# Patient Record
Sex: Female | Born: 1960 | Race: White | Hispanic: No | Marital: Married | Smoking: Never smoker
Health system: Southern US, Community
[De-identification: ages and names within clinical notes are randomized; demographics above are authoritative.]

## PROBLEM LIST (undated history)

## (undated) DIAGNOSIS — Q2381 Bicuspid aortic valve: Secondary | ICD-10-CM

## (undated) DIAGNOSIS — R011 Cardiac murmur, unspecified: Secondary | ICD-10-CM

## (undated) DIAGNOSIS — I498 Other specified cardiac arrhythmias: Secondary | ICD-10-CM

## (undated) DIAGNOSIS — Q251 Coarctation of aorta: Secondary | ICD-10-CM

## (undated) DIAGNOSIS — I776 Arteritis, unspecified: Secondary | ICD-10-CM

## (undated) DIAGNOSIS — I712 Thoracic aortic aneurysm, without rupture, unspecified: Secondary | ICD-10-CM

## (undated) DIAGNOSIS — I499 Cardiac arrhythmia, unspecified: Secondary | ICD-10-CM

## (undated) DIAGNOSIS — I350 Nonrheumatic aortic (valve) stenosis: Secondary | ICD-10-CM

## (undated) DIAGNOSIS — R0602 Shortness of breath: Secondary | ICD-10-CM

## (undated) DIAGNOSIS — C8590 Non-Hodgkin lymphoma, unspecified, unspecified site: Secondary | ICD-10-CM

## (undated) DIAGNOSIS — I480 Paroxysmal atrial fibrillation: Secondary | ICD-10-CM

## (undated) DIAGNOSIS — I509 Heart failure, unspecified: Secondary | ICD-10-CM

## (undated) DIAGNOSIS — Q231 Congenital insufficiency of aortic valve: Secondary | ICD-10-CM

## (undated) DIAGNOSIS — N809 Endometriosis, unspecified: Secondary | ICD-10-CM

## (undated) HISTORY — DX: Coarctation of aorta: Q25.1

## (undated) HISTORY — DX: Paroxysmal atrial fibrillation: I48.0

## (undated) HISTORY — PX: OTHER SURGICAL HISTORY: SHX169

## (undated) HISTORY — DX: Nonrheumatic aortic (valve) stenosis: I35.0

## (undated) HISTORY — DX: Thoracic aortic aneurysm, without rupture, unspecified: I71.20

## (undated) HISTORY — DX: Endometriosis, unspecified: N80.9

## (undated) HISTORY — DX: Congenital insufficiency of aortic valve: Q23.1

## (undated) HISTORY — DX: Thoracic aortic aneurysm, without rupture: I71.2

## (undated) HISTORY — DX: Bicuspid aortic valve: Q23.81

## (undated) HISTORY — DX: Other specified cardiac arrhythmias: I49.8

## (undated) HISTORY — DX: Shortness of breath: R06.02

## (undated) HISTORY — DX: Arteritis, unspecified: I77.6

## (undated) HISTORY — PX: PORTA CATH INSERTION: CATH118285

## (undated) HISTORY — PX: KNEE SURGERY: SHX244

---

## 1998-04-24 ENCOUNTER — Ambulatory Visit (HOSPITAL_COMMUNITY): Admission: RE | Admit: 1998-04-24 | Discharge: 1998-04-24 | Payer: Self-pay | Admitting: Cardiovascular Disease

## 2001-09-20 ENCOUNTER — Ambulatory Visit (HOSPITAL_COMMUNITY): Admission: RE | Admit: 2001-09-20 | Discharge: 2001-09-20 | Payer: Self-pay | Admitting: Cardiovascular Disease

## 2002-04-14 ENCOUNTER — Ambulatory Visit (HOSPITAL_COMMUNITY): Admission: RE | Admit: 2002-04-14 | Discharge: 2002-04-14 | Payer: Self-pay | Admitting: Cardiovascular Disease

## 2002-04-14 ENCOUNTER — Encounter: Payer: Self-pay | Admitting: Cardiovascular Disease

## 2004-05-13 ENCOUNTER — Ambulatory Visit: Payer: Self-pay | Admitting: Internal Medicine

## 2004-08-18 ENCOUNTER — Ambulatory Visit: Payer: Self-pay | Admitting: Cardiovascular Disease

## 2004-08-29 ENCOUNTER — Ambulatory Visit: Payer: Self-pay

## 2006-11-25 ENCOUNTER — Ambulatory Visit: Payer: Self-pay | Admitting: Cardiovascular Disease

## 2007-04-06 ENCOUNTER — Other Ambulatory Visit: Admission: RE | Admit: 2007-04-06 | Discharge: 2007-04-06 | Payer: Self-pay | Admitting: Obstetrics and Gynecology

## 2008-07-18 DIAGNOSIS — I480 Paroxysmal atrial fibrillation: Secondary | ICD-10-CM

## 2008-07-18 DIAGNOSIS — Q231 Congenital insufficiency of aortic valve: Secondary | ICD-10-CM | POA: Insufficient documentation

## 2008-07-19 ENCOUNTER — Ambulatory Visit: Payer: Self-pay | Admitting: Cardiovascular Disease

## 2008-08-13 ENCOUNTER — Telehealth: Payer: Self-pay | Admitting: Cardiovascular Disease

## 2008-09-21 ENCOUNTER — Telehealth (INDEPENDENT_AMBULATORY_CARE_PROVIDER_SITE_OTHER): Payer: Self-pay | Admitting: *Deleted

## 2009-02-25 ENCOUNTER — Telehealth: Payer: Self-pay | Admitting: Cardiovascular Disease

## 2009-02-27 ENCOUNTER — Telehealth (INDEPENDENT_AMBULATORY_CARE_PROVIDER_SITE_OTHER): Payer: Self-pay | Admitting: *Deleted

## 2009-08-26 ENCOUNTER — Telehealth: Payer: Self-pay | Admitting: Cardiovascular Disease

## 2009-09-12 ENCOUNTER — Ambulatory Visit: Payer: Self-pay | Admitting: Cardiovascular Disease

## 2009-09-12 DIAGNOSIS — Q251 Coarctation of aorta: Secondary | ICD-10-CM | POA: Insufficient documentation

## 2009-09-12 DIAGNOSIS — I359 Nonrheumatic aortic valve disorder, unspecified: Secondary | ICD-10-CM | POA: Insufficient documentation

## 2009-09-12 DIAGNOSIS — N809 Endometriosis, unspecified: Secondary | ICD-10-CM | POA: Insufficient documentation

## 2009-10-02 ENCOUNTER — Ambulatory Visit: Payer: Self-pay | Admitting: Cardiology

## 2009-10-02 ENCOUNTER — Encounter: Payer: Self-pay | Admitting: Cardiovascular Disease

## 2009-10-02 ENCOUNTER — Ambulatory Visit (HOSPITAL_COMMUNITY): Admission: RE | Admit: 2009-10-02 | Discharge: 2009-10-02 | Payer: Self-pay | Admitting: Cardiovascular Disease

## 2009-10-02 ENCOUNTER — Ambulatory Visit: Payer: Self-pay

## 2010-02-25 ENCOUNTER — Telehealth: Payer: Self-pay | Admitting: Cardiovascular Disease

## 2010-02-26 ENCOUNTER — Encounter: Payer: Self-pay | Admitting: Cardiovascular Disease

## 2010-03-12 ENCOUNTER — Ambulatory Visit: Payer: Self-pay | Admitting: Cardiovascular Disease

## 2010-03-12 DIAGNOSIS — I712 Thoracic aortic aneurysm, without rupture: Secondary | ICD-10-CM

## 2010-03-13 LAB — CONVERTED CEMR LAB
BUN: 8 mg/dL (ref 6–23)
Basophils Absolute: 0 10*3/uL (ref 0.0–0.1)
Basophils Relative: 0.5 % (ref 0.0–3.0)
CO2: 27 meq/L (ref 19–32)
Calcium: 9.3 mg/dL (ref 8.4–10.5)
Chloride: 105 meq/L (ref 96–112)
Creatinine, Ser: 0.7 mg/dL (ref 0.4–1.2)
Eosinophils Absolute: 0.2 10*3/uL (ref 0.0–0.7)
Eosinophils Relative: 1.9 % (ref 0.0–5.0)
GFR calc non Af Amer: 96.52 mL/min (ref >60)
Glucose, Bld: 74 mg/dL (ref 70–99)
HCT: 43.2 % (ref 36.0–46.0)
Hemoglobin: 14.8 g/dL (ref 12.0–15.0)
Lymphocytes Relative: 21.7 % (ref 12.0–46.0)
Lymphs Abs: 2.1 10*3/uL (ref 0.7–4.0)
MCHC: 34.2 g/dL (ref 30.0–36.0)
MCV: 93.8 fL (ref 78.0–100.0)
Monocytes Absolute: 0.8 10*3/uL (ref 0.1–1.0)
Monocytes Relative: 8.3 % (ref 3.0–12.0)
Neutro Abs: 6.6 10*3/uL (ref 1.4–7.7)
Neutrophils Relative %: 67.6 % (ref 43.0–77.0)
Platelets: 302 10*3/uL (ref 150.0–400.0)
Potassium: 4.1 meq/L (ref 3.5–5.1)
Pro B Natriuretic peptide (BNP): 85.3 pg/mL (ref 0.0–100.0)
RBC: 4.61 M/uL (ref 3.87–5.11)
RDW: 13.3 % (ref 11.5–14.6)
Sodium: 141 meq/L (ref 135–145)
WBC: 9.8 10*3/uL (ref 4.5–10.5)

## 2010-04-10 ENCOUNTER — Ambulatory Visit: Payer: Self-pay

## 2010-04-10 ENCOUNTER — Encounter: Payer: Self-pay | Admitting: Cardiovascular Disease

## 2010-04-10 ENCOUNTER — Ambulatory Visit (HOSPITAL_COMMUNITY)
Admission: RE | Admit: 2010-04-10 | Discharge: 2010-04-10 | Payer: Self-pay | Source: Home / Self Care | Attending: Cardiovascular Disease | Admitting: Cardiovascular Disease

## 2010-04-23 ENCOUNTER — Ambulatory Visit: Payer: Self-pay | Admitting: Cardiovascular Disease

## 2010-04-23 DIAGNOSIS — R0602 Shortness of breath: Secondary | ICD-10-CM

## 2010-05-06 LAB — CONVERTED CEMR LAB
BUN: 6 mg/dL (ref 6–23)
CO2: 28 meq/L (ref 19–32)
Calcium: 9.2 mg/dL (ref 8.4–10.5)
Chloride: 105 meq/L (ref 96–112)
Creatinine, Ser: 0.8 mg/dL (ref 0.4–1.2)
GFR calc non Af Amer: 85 mL/min (ref >60.00)
Glucose, Bld: 75 mg/dL (ref 70–99)
Potassium: 4.1 meq/L (ref 3.5–5.1)
Pro B Natriuretic peptide (BNP): 61.9 pg/mL (ref 0.0–100.0)
Sodium: 140 meq/L (ref 135–145)

## 2010-06-03 NOTE — Assessment & Plan Note (Signed)
Summary: f25m/dfg   Primary Provider:  Dr.  CC:  SOB.  History of Present Illness: Mallory Sutton  returns today for follow-up.  I initially met her about 11 years ago.  When her son was born, she went into pulmonary edema at [facility] and was found to have recoarctation.  The patient unfortunately does not have insurance.  She tends to see me every couple of years now and likes to avoid imaging.  She has a bicuspid aortic valve with aortic stenosis.  She has had significant aortic root dilatation.  Her coarctation repair was done within the first 2 years of life and I believe it was an end-to-end anastomosis.  Back in 2003, her recoarctation area showed that her luminal diameter was down to as much as 10 mm.  She has had an ascending aorta, this measured as large as 42 mm.  She just had an echo in June  and her peak velocity was 3.35 m/sec, pl gradient 45 mmHg, and mean gradient 25 mm Hg with mild AR.   We scanned her on the Philops 256 during apps training.  Her ascending aortic root is stable at 42mm and residual coarctatoin diameter is 11mm with no anastomotic aneuyrsm.  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    She has had increasing exertional dyspnea which is a bit worrisome.  We discussed issues with exepertise in single surgery consisting of a Bental with bypass graft from root to descending thoracic oarta.  Dr Tomasa Blase at [facility] has a large series and I think this would be the best place for Mallory Sutton to consider surgery when the time comes.  She will likley need a mechanical conduit given her relatively young age.  We will check a BMET/BNP today and recheck an echo in December.  She will not have insurance that covers a pre-existing condition until a year from February.  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 Take 1 Tablet T.i.d. 2)  Toprol Xl 25 Mg Xr24h-Tab (Metoprolol Succinate) .... Take 1 Tablet By Mouth Once A Day 3)  Lisinopril 5 Mg Tabs (Lisinopril) .... Take One Tablet By Mouth Daily 4)  Promethazine Hcl 25 Mg Tabs (Promethazine Hcl) .... As Needed  Allergies (verified): No Known Drug Allergies  Past History:  Past Medical History: Last updated: 07/18/2008  bicuspid aortic valve with aortic stenosis paroxysmal atrial fibrillation   Past Surgical History: Last updated: 07/18/2008 coarctation repair with residual restenosis. knee surgery  Family History: Last updated: 07/18/2008 noncontributory  Social History: Last updated: 07/18/2008 She is very active. 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  Alcohol Use - no  Review of Systems       Denies fever, malais, weight loss, blurry vision, decreased visual acuity, cough, sputum, , hemoptysis, pleuritic pain, palpitaitons, heartburn, abdominal pain, melena, lower extremity edema, claudication, or rash.   Vital Signs:  Patient profile:   50 year old female Height:      64 inches Weight:      131 pounds BMI:     22.57 Pulse  rate:   68 / minute Resp:     14 per minute BP sitting:   118 / 80  (left arm)  Vitals Entered By: Kem Parkinson (March 12, 2010 11:22 AM)  Physical Exam  General:  Affect appropriate Healthy:  appears stated age HEENT: normal Neck supple with no adenopathy JVP normal no bruits no thyromegaly Lungs clear with no wheezing and good diaphragmatic motion Heart:  S1/S2 AS/AR   murmur no rub, gallop or click PMI normal S/P left thoracotomy scar for coartation repair.  Residual coarctation murmur in back Abdomen: benighn, BS positve, no  tenderness, no AAA no bruit.  No HSM or HJR Distal pulses intact with no bruits No edema Neuro non-focal Skin warm and dry    Impression & Recommendations:  Problem # 1:  COARCTATION OF AORTA (ICD-747.10) Peak velocity on echo in June 36 mmHg.  Contributing to aneurysmal dilatation of ascending aorta and dyspnea.   F/U echo in December.    Problem # 2:  BICUSPID AORTIC VALVE (ICD-746.4) Not surgical by numbers but in combination with coarctation will likely be symptomatic at more moderate disease Will need consideration of surgery at specialty center like [facility] when time comes due to complexity of disease Her updated medication list for this problem includes:    Rythmol 150 Mg Tabs (Propafenone hcl) .Marland Kitchen... Take 1 tablet t.i.d.    Toprol Xl 25 Mg Xr24h-tab (Metoprolol succinate) .Marland Kitchen... Take 1 tablet by mouth once a day    Lisinopril 5 Mg Tabs (Lisinopril) .Marland Kitchen... 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  Stable at 42mm.  F/U imaging in 6 months  Other Orders: TLB-BMP (Basic Metabolic Panel-BMET) (80048-METABOL) TLB-CBC Platelet - w/Differential (85025-CBCD) TLB-BNP (B-Natriuretic Peptide) (83880-BNPR) Echocardiogram (Echo)  Patient Instructions: 1)  Your physician recommends that you schedule a follow-up appointment in: December 2)  Your physician has requested that you have an echocardiogram.  Echocardiography is a painless test that uses sound waves to create images of your heart. It provides your doctor with information about the size and shape of your heart and how well your heart's chambers and valves are working.  This procedure takes approximately one hour. There are no restrictions for this procedure. Schedule in December same day as appointment

## 2010-06-03 NOTE — Progress Notes (Signed)
Summary: Question about taking a med  Phone Note Call from Patient Call back at Home Phone [phone]   Caller: Patient Summary of Call: Pt calling to see if she can take Valium befoe CT Initial call taken by: Judie Grieve,  February 25, 2010 11:32 AM  Follow-up for Phone Call        left message for pt, okay to take valium but will need a driver Deliah Goody, RN  February 25, 2010 4:29 PM

## 2010-06-03 NOTE — Progress Notes (Signed)
Summary: refill**Pt it out**  Phone Note Refill Request   Refills Requested: Medication #1:  RYTHMOL 150 MG TABS take 1 tablet t.i.d.   Supply Requested: 3 months [facility]    Method Requested: Fax to Local Pharmacy Initial call taken by: Migdalia Dk,  August 26, 2009 10:30 AM Caller: Patient Reason for Call: Talk to Nurse    Prescriptions: RYTHMOL 150 MG TABS (PROPAFENONE HCL) take 1 tablet t.i.d.  #270 x 3   Entered by:   Kem Parkinson   Authorized by:   Colon Branch, MD, [facility]   Signed by:   Kem Parkinson on 08/26/2009   Method used:   Electronically to        [facility] [address] [phone]* (retail)       [address] [place], [place]  [address]       Ph: [phone]       Fax: [phone]   RxID:   [id number]

## 2010-06-03 NOTE — Assessment & Plan Note (Signed)
Summary: f1y/jss   Primary Provider:  Dr.   History of Present Illness: [name] returns today for followup.  I initially met her about 11 years ago.  When her son was born, she went into pulmonary edema at [facility] and was found to have recoarctation.  The patient unfortunately does not have insurance.  She tends to see me every couple of years now and likes to avoid imaging.  She has a bicuspid aortic valve with aortic stenosis.  She has had significant aortic root dilatation.  Her coarctation repair was done within the first 2 years of life and I believe it was an end-to-end anastomosis.  Back in 2003, her recoarctation area showed that her luminal diameter was down to as much as 10 mm.  She has had an ascending aorta, this measured as large as 42 mm.  Her echoes have showed peak aortic velocities of 3 meters per second with a peak gradient of 34 and a mean gradient of 21 with a bicuspid aortic valve.  She has had some dyspnea which is stable but likely due to her AS.  There has been no overt CHF.  She has had chronic severe endometriosis.  She needs a hysterectomy.  We had a long discussion about this. I would rather have her do it before any AVR/Root repair as this would be more complicated with anticoagulaiton and risk of bacteremia.  I think she could get through a hysterectomy with moderate risk of CHF.  We will try to image her thoracic aota with the new Philops 256 scanner in July to size her root and estimate the severity of her coarctation.  She has no dye allergy.  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 Take 1 Tablet T.i.d. 2)  Toprol Xl 25 Mg Xr24h-Tab (Metoprolol Succinate) .... Take 1 Tablet By Mouth Once A  Day 3)  Lisinopril 5 Mg Tabs (Lisinopril) .... Take One Tablet By Mouth Daily 4)  Promethazine Hcl 25 Mg Tabs (Promethazine Hcl) .... As Needed  Allergies (verified): No Known Drug Allergies  Past History:  Past Medical History: Last updated: 07/18/2008  bicuspid aortic valve with aortic stenosis paroxysmal atrial fibrillation   Past Surgical History: Last updated: 07/18/2008 coarctation repair with residual restenosis. knee surgery  Family History: Last updated: 07/18/2008 noncontributory  Social History: Last updated: 07/18/2008 She is very active. 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  Alcohol Use - no  Review of Systems       Denies fever, malais, weight loss, blurry vision, decreased visual acuity, cough, sputum,  hemoptysis, pleuritic pain, palpitaitons, heartburn, abdominal pain, melena, lower extremity edema, claudication, or rash.   Vital Signs:  Patient profile:   50 year old female Height:      64 inches Weight:      131 pounds BMI:     22.57 Pulse rate:   65 / minute BP sitting:   135 / 77  (left arm)  Vitals Entered By: Kem Parkinson (Sep 12, 2009 10:12 AM)  Physical Exam  General:  Affect appropriate Healthy:  appears stated age HEENT: normal Neck supple with no adenopathy JVP normal no bruits no thyromegaly Lungs clear with no wheezing and  good diaphragmatic motion Heart:  S1/S2 AS  murmurno ,rub, gallop or click PMI normal Coarctation murmur in mid back Abdomen: benighn, BS positve, no tenderness, no AAA no bruit.  No HSM or HJR Distal pulses intact with no bruits No edema Neuro non-focal Skin warm and dry    Impression & Recommendations:  Problem # 1:  COARCTATION OF AORTA (ICD-747.10) CTA in July during apps if possible given lack of insurance Orders: Echocardiogram (Echo)  Problem # 2:  AORTIC STENOSIS (ICD-424.1) Moderate by exam.  F/U doppler.  Will use tradtional criteria for AVR/Root replacement.  Coarctation  issue is separate Her updated medication list for this problem includes:    Toprol Xl 25 Mg Xr24h-tab (Metoprolol succinate) .Marland Kitchen... Take 1 tablet by mouth once a day    Lisinopril 5 Mg Tabs (Lisinopril) .Marland Kitchen... Take one tablet by mouth daily  Orders: Echocardiogram (Echo)  Problem # 3:  ENDOMETRIOSIS (ICD-617.9) Severe and debilitating since 2003.  I think she should have a hysterectomy.  Her cardiac risk will only increase over time and I would rather her have surgery when clincially stable, and before any AVR/Root repair. She will see Dr Senaida Ores next month and we can coordinate care if she agrees that surgery is indicated.  Moderate risk of CHF.  Other Orders: EKG w/ Interpretation (93000)  Patient Instructions: 1)  Your physician recommends that you schedule a follow-up appointment in:6 months with Dr. [place] Emms 2)  Your physician recommends that you continue on your current medications as directed. Please refer to the Current Medication list given to you today. 3)  Your physician has requested that you have an echocardiogram.  Echocardiography is a painless test that uses sound waves to create images of your heart. It provides your doctor with information about the size and shape of your heart and how well your heart's chambers and valves are working.  This procedure takes approximately one hour. There are no restrictions for this procedure. Prescriptions: PROMETHAZINE HCL 25 MG TABS (PROMETHAZINE HCL) as needed  #30 x 6   Entered by:   Lisabeth Devoid RN   Authorized by:   Colon Branch, MD, [facility]   Signed by:   Lisabeth Devoid RN on 09/12/2009   Method used:   Electronically to        [facility] [phone]* (retail)       [address] [place], [place]  [address]       Ph: [phone]       Fax: [phone]   RxID:   [id number]    EKG Report  Procedure date:  09/12/2009  Findings:      SR 63 Possible IMI Poor R wave progression Abnormal ECG

## 2010-06-05 NOTE — Assessment & Plan Note (Signed)
Summary: f/u echo done today at 10:30/sl   Primary Provider:  Dr.  CC:  follow up echo...and sob.  History of Present Illness: Creta returns today for F/U of dyspnea, Ascending aortic aneurysm in setting of bicuspid aortic valve with moderate stenosis.  She is S/O coarctation repair as child with recurrent stenosis.  Reviewed CT done 10/11 Ascending AO: 4.3 cm Minimal Coarctation diameter: 11mm Cal Score 0 No CAD Persistant Left SVC  Reviewed echo 12/11 and EF normal, septal thickness 13mm, coarctation gradient around and moderate AS with mean gradient 57mmHg/peak of  Contacted Dr Tomasa Blase at [facility]. He specializes in [place] repair with ascending to descending thoracic bypass to treat all of her issues with single operation.  Have initiated paperwork for charity care since Aviendha has no insurance.  If [facility] can do surgery with special financial assistance have advised her to proceed.  Otherwise individual componants of her disease do not warrant operation right away.   She does have dyspnea.  Interetingly it is worse in recumbancy and not exertion.  No clinical CHF.  No palpitaiotns, SSCP syncope or edema.    Husband has been training as long distance truck driver.  Has not been home since September and this has been hard on her and her youngest son who is only 2.  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 Take 1 Tablet T.i.d. 2)  Toprol Xl 25 Mg Xr24h-Tab (Metoprolol Succinate) .... Take 1 Tablet By Mouth Once A Day 3)  Lisinopril 5 Mg Tabs (Lisinopril) .... Take One Tablet By Mouth Daily 4)  Promethazine Hcl 25 Mg Tabs (Promethazine Hcl)  .... As Needed  Allergies (verified): No Known Drug Allergies  Past History:  Past Medical History: Last updated: 07/18/2008  bicuspid aortic valve with aortic stenosis paroxysmal atrial fibrillation   Past Surgical History: Last updated: 07/18/2008 coarctation repair with residual restenosis. knee surgery  Family History: Last updated: 04/23/2010 noncontributory Noother congenital heart disease noted  Social History: Last updated: 07/18/2008 She is very active. 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  Alcohol Use - no  Family History: noncontributory Noother congenital heart disease noted  Review of Systems       Denies fever, malais, weight loss, blurry vision, decreased visual acuity, cough, sputum, hemoptysis, pleuritic pain, palpitaitons, heartburn, abdominal pain, melena, lower extremity edema, claudication, or rash.   Vital Signs:  Patient profile:   50 year old female Height:      64 inches Weight:      133 pounds BMI:     22.91 Pulse rate:   64 / minute Resp:     14 per minute BP sitting:   132 / 71  (left arm)  Vitals Entered By: Kem Parkinson (April 23, 2010 11:22 AM)  Physical Exam  General:  Affect appropriate Healthy:  appears stated age HEENT: normal Neck supple with no adenopathy JVP normal no bruits no thyromegaly Lungs clear with no wheezing and good diaphragmatic motion Heart:  S1/S2 preserved moderat AS  murmur, no rub, gallop or click  Coarctation murmur heard in back PMI normal Abdomen: benighn, BS positve, no tenderness, no AAA no bruit.  No HSM or HJR Distal pulses intact with no bruits No edema Neuro non-focal Skin warm and dry    Impression & Recommendations:  Problem # 1:  SHORTNESS OF BREATH (ICD-786.05) Doubt cardiac etiology.  Check BNP and BMET Her updated medication list for this problem includes:    Toprol Xl 25 Mg Xr24h-tab (Metoprolol succinate) .Marland Kitchen... Take 1 tablet by mouth once a day     Lisinopril 5 Mg Tabs (Lisinopril) .Marland Kitchen... 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  Continue surveilance imaging and Rx of BP  Problem # 3:  BICUSPID AORTIC VALVE (ICD-746.4) Moderate stenosis and mild AR  Stable  Her updated medication list for this problem includes:    Rythmol 150 Mg Tabs (Propafenone hcl) .Marland Kitchen... Take 1 tablet t.i.d.    Toprol Xl 25 Mg Xr24h-tab (Metoprolol succinate) .Marland Kitchen... Take 1 tablet by mouth once a day    Lisinopril 5 Mg Tabs (Lisinopril) .Marland Kitchen... Take one tablet by mouth daily  Problem # 4:  COARCTATION OF AORTA (ICD-747.10) Significant recoarctation.  Combined with ascending arotic aneurysm and AS have initated review by Dr Tomasa Blase at [facility].  Pending financial analysis may be candidate for Bental with ascending to descending aortic bypass.  Will await [facility] analysis  Problem # 5:  ATRIAL ARRHYTHMIAS (ICD-427.89) Maint NRS  Continue Rythmol Her updated medication list for this problem includes:    Rythmol 150 Mg Tabs (Propafenone hcl) .Marland Kitchen... Take 1 tablet t.i.d.    Toprol Xl 25 Mg Xr24h-tab (Metoprolol succinate) .Marland Kitchen... Take 1 tablet by mouth once a day    Lisinopril 5 Mg Tabs (Lisinopril) .Marland Kitchen... Take one tablet by mouth daily  Patient Instructions: 1)  Your physician recommends that you schedule a follow-up appointment in: 3 months with Dr. [place] Emms 2)  Your physician recommends that you have  lab work today; BMET, BNP

## 2010-07-02 ENCOUNTER — Ambulatory Visit: Payer: Self-pay | Admitting: Cardiovascular Disease

## 2010-07-23 ENCOUNTER — Encounter: Payer: Self-pay | Admitting: Cardiovascular Disease

## 2010-08-04 ENCOUNTER — Ambulatory Visit (INDEPENDENT_AMBULATORY_CARE_PROVIDER_SITE_OTHER): Payer: PRIVATE HEALTH INSURANCE | Admitting: Cardiovascular Disease

## 2010-08-04 ENCOUNTER — Encounter: Payer: Self-pay | Admitting: Cardiovascular Disease

## 2010-08-04 DIAGNOSIS — Q251 Coarctation of aorta: Secondary | ICD-10-CM

## 2010-08-04 DIAGNOSIS — I712 Thoracic aortic aneurysm, without rupture: Secondary | ICD-10-CM

## 2010-08-04 DIAGNOSIS — N809 Endometriosis, unspecified: Secondary | ICD-10-CM

## 2010-08-04 DIAGNOSIS — I498 Other specified cardiac arrhythmias: Secondary | ICD-10-CM

## 2010-08-04 DIAGNOSIS — Q231 Congenital insufficiency of aortic valve: Secondary | ICD-10-CM

## 2010-08-04 NOTE — Assessment & Plan Note (Signed)
43 mm in setting of bicuspid valve and coarctation.  Visit Dr Tomasa Blase at [facility] in June.  Will need F/U sizing in a year with MRA or prospectively gated cardiac CT

## 2010-08-04 NOTE — Patient Instructions (Signed)
Your physician has requested that you have an echocardiogram. Echocardiography is a painless test that uses sound waves to create images of your heart. It provides your doctor with information about the size and shape of your heart and how well your heart's chambers and valves are working. This procedure takes approximately one hour. There are no restrictions for this procedure. SCHEDULE IN December  Your physician recommends that you schedule a follow-up appointment in: [facility]

## 2010-08-04 NOTE — Assessment & Plan Note (Signed)
Maint NSR  Continue propofenone

## 2010-08-04 NOTE — Assessment & Plan Note (Signed)
Moderate gradient.  Normal EF  F/U echo in 12/12

## 2010-08-04 NOTE — Progress Notes (Signed)
Mallory Sutton returns today for F/U of dyspnea, Ascending aortic aneurysm in setting of bicuspid aortic valve with moderate stenosis.  She is S/O coarctation repair as child with recurrent stenosis.  Reviewed CT done 10/11 Ascending AO: 4.3 cm Minimal Coarctation diameter: 11mm Cal Score 0 No CAD Persistant Left SVC  Reviewed echo 12/11 and EF normal, septal thickness 13mm, coarctation gradient around and moderate AS with mean gradient 34mmHg/peak of  Contacted Dr Tomasa Blase at [facility]. He specializes in [place] repair with ascending to descending thoracic bypass to treat all of her issues with single operation.   She has worked out [employer] with the state of [place].  Should be covered with no existing precondition limitations by May.    She does have dyspnea it is stable  BNP normal 12/11   Interetingly it is worse in recumbancy and not exertion.  No clinical CHF.  No palpitaiotns, SSCP syncope or edema.    Husband has been training as long distance truck driver.  Has not been home since September and this has been hard on her and her youngest son who is only 78.   ROS: Denies fever, malais, weight loss, blurry vision, decreased visual acuity, cough, sputum, , hemoptysis, pleuritic pain, palpitaitons, heartburn, abdominal pain, melena, lower extremity edema, claudication, or rash.   General: Affect appropriate Healthy:  appears stated age HEENT: normal Neck supple with no adenopathy JVP normal no bruits no thyromegaly Lungs clear with no wheezing and good diaphragmatic motion Heart:  S1/S2 AS murmur no ,rub, gallop or click PMI normal  Coarctation murmur in back Abdomen: benighn, BS positve, no tenderness, no AAA no bruit.  No HSM or HJR Distal pulses intact with no bruits No edema Neuro non-focal Skin warm and dry No muscular weakness S/P left thoracotomy   Current Outpatient Prescriptions  Medication Sig Dispense Refill  . lisinopril (PRINIVIL,ZESTRIL) 5 MG tablet  Take 5 mg by mouth daily.        . metoprolol succinate (TOPROL-XL) 25 MG 24 hr tablet Take 25 mg by mouth daily.        . promethazine (PHENERGAN) 25 MG tablet Take 25 mg by mouth every 6 (six) hours as needed.        . propafenone (RYTHMOL) 150 MG tablet Take 150 mg by mouth every 8 (eight) hours.          Allergies  Penicillins  Electrocardiogram:  Assessment and Plan

## 2010-08-12 ENCOUNTER — Emergency Department (HOSPITAL_COMMUNITY): Payer: PRIVATE HEALTH INSURANCE

## 2010-08-12 ENCOUNTER — Emergency Department (HOSPITAL_COMMUNITY)
Admission: EM | Admit: 2010-08-12 | Discharge: 2010-08-12 | Disposition: A | Discharge status: Home / Self Care | Payer: PRIVATE HEALTH INSURANCE | Attending: Emergency Medicine | Admitting: Emergency Medicine

## 2010-08-12 ENCOUNTER — Other Ambulatory Visit: Payer: Self-pay | Admitting: Cardiovascular Disease

## 2010-08-12 DIAGNOSIS — S2239XA Fracture of one rib, unspecified side, initial encounter for closed fracture: Secondary | ICD-10-CM | POA: Insufficient documentation

## 2010-08-12 DIAGNOSIS — X500XXA Overexertion from strenuous movement or load, initial encounter: Secondary | ICD-10-CM | POA: Insufficient documentation

## 2010-08-12 DIAGNOSIS — R079 Chest pain, unspecified: Secondary | ICD-10-CM | POA: Insufficient documentation

## 2010-08-28 ENCOUNTER — Ambulatory Visit (HOSPITAL_COMMUNITY)
Admission: RE | Admit: 2010-08-28 | Discharge: 2010-08-28 | Disposition: A | Discharge status: Home / Self Care | Payer: PRIVATE HEALTH INSURANCE | Source: Ambulatory Visit | Attending: Internal Medicine | Admitting: Internal Medicine

## 2010-08-28 ENCOUNTER — Other Ambulatory Visit (HOSPITAL_COMMUNITY): Payer: Self-pay | Admitting: Internal Medicine

## 2010-08-28 DIAGNOSIS — S2232XA Fracture of one rib, left side, initial encounter for closed fracture: Secondary | ICD-10-CM

## 2010-08-28 DIAGNOSIS — R0789 Other chest pain: Secondary | ICD-10-CM | POA: Insufficient documentation

## 2010-09-09 ENCOUNTER — Other Ambulatory Visit: Payer: Self-pay | Admitting: Cardiovascular Disease

## 2010-09-16 NOTE — Assessment & Plan Note (Signed)
[facility]                            CARDIOLOGY OFFICE NOTE   NAME:IMUSMarlaina, Coburn                           MRN:          [id number]  DATE:07/19/2008                            DOB:          1961-01-02    Ms. Artman returns today for followup.  I initially met her about 11 years  ago.  When her son was born, she went into pulmonary edema at [facility] and was found to have recoarctation.   The patient unfortunately does not have insurance.  She tends to see me  every couple of years now and likes to avoid imaging.   She has a bicuspid aortic valve with aortic stenosis.  She has had  significant aortic root dilatation.   Her coarctation repair was done within the first 2 years of life and I  believe it was an end-to-end anastomosis.   Back in 2003, her recoarctation area showed that her luminal diameter  was down to as much as 10 mm.   She has had an ascending aorta, this measured as large as 42 mm.   Her echoes have showed peak aortic velocities of 3 meters per second  with a peak gradient of 34 and a mean gradient of 21 with a bicuspid  aortic valve.   She has been doing fairly well.  Her biggest complaint actually revolves  around probable endometriosis.  She has had significant pains in her  belly and difficulty with her menstruation.  She saw a local OB/GYN  doctor in [place].  I do not recall talking to him, but apparently  she was placed on hormones.  I probably would not have recommended this  due to some of the literature talking about estrogen and progesterone  weakening the aortic media and adventitia.  She had a bad experience  with this and actually bled quite a bit and took about 4 months to  recover.  She would like to be referred to an OB/GYN doctor here in town  and I think that Dr. Huel Cote would be a good person for her to  see.  From a cardiac standpoint, she has been stable.  She is not having  chest pain.   She does have some fatigue.  There has been no presyncope.  There has been no diaphoresis.  She does get exertional dyspnea.  I told  her that I was still concerned she has two different reasons to get  increasing ascending aortic dilatation in regards to her ascending aorta  and arch being between coarctation and a stenotic aortic valve.  She has  not had imaged in quite some time.  I think she is willing to pay out of  pocket for a cardiac MRI and aortic MRA which I would really think would  be necessary at this point.  I told her we would try to do a 2D  echocardiogram without charge today to further assess by Doppler the  gradient across the coarc and aortic valve.   REVIEW OF SYSTEMS:  Otherwise negative.   MEDICATIONS:  1. She  is on Toprol 25 a day.  2. Norvasc 2.5 a day.  3. Rythmol 150 b.i.d.   PHYSICAL EXAMINATION:  GENERAL:  Remarkable for a middle-aged female in  no distress.  VITAL SIGNS:  Her blood pressure is 130/70 equal on both sides, pulse 80  and regular, respiratory rate 14, afebrile.  She has a coarctation  murmur in the back.  NECK:  Carotids have a bit of a shutter on the left side with a referred  murmur.  There is no innate bruits that I can tell.  No lymphadenopathy  or thyromegaly.  CARDIAC:  There is an S1.  Second heart sound is preserved with more of  a mild aortic insufficiency and mild AS murmur.  PMI is normal.  ABDOMEN:  Benign.  Bowel sounds positive.  No AAA, no tenderness, no  bruit.  No hepatosplenomegaly or hepatojugular reflux.  No tenderness.  I did not check for radial femoral delay.   EKG was not done.   IMPRESSION:  1. 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  Followup aortic MRI and aortic MRA to assess,      also suprasternal notch Doppler to assess gradients.  2. Bicuspid aortic valve.  Followup echo to assess gradients.      Clinically, the valve sounds like it is still mild.  3.  Thoracic aortic aneurysm secondary to coarctation and bicuspid      valve.  This should be sized by MRI.  4. Hypertension, currently well controlled.  Continue current      medications.  5. Menorrhagia, probable endometriosis.  Followup with Huel Cote as a new patient.  Avoid hormonal therapy at this time.   Overall, it was very nice to see Sally-Ann and we will hopefully be able to  do her echo at no charge and she will be able to pay for the MRI over at  [facility] since I do not have any control over this.     Noralyn Pick. Eden Emms, MD, [facility]  Electronically Signed    PCN/MedQ  DD: 07/19/2008  DT: 07/20/2008  Job #: [id number]

## 2010-09-16 NOTE — Assessment & Plan Note (Signed)
[facility]                            CARDIOLOGY OFFICE NOTE   NAME:Mallory Sutton, MARLISS                           MRN:          [id number]  DATE:11/25/2006                            DOB:          May 10, 1960    Mallory Sutton returns today for followup.  I have not seen Mallory Sutton in a couple of  years.  I think that she was somewhat scared to follow up, given Mallory Sutton  known cardiac problems and lack of insurance.   I assured Mallory Sutton that we would be happy to see Mallory Sutton any time, and try to  work out seeing Mallory Sutton without necessarily charging Mallory Sutton.   Mallory Sutton husband, apparently, got a call from a collection agency which got  him quite upset.   We actually had a good visit despite these insurance issues.   Maleeah has been doing well.  She is very active.  Mallory Sutton son, Mallory Sutton, is about  56 years old, and Mallory Sutton daughter is 53.  I actually met Mallory Sutton when she was  in pulmonary edema from coarctation at [facility] many years ago.  She has a bicuspid aortic valve with aortic stenosis, and has had  previous coarctation repair with residual restenosis.   We had done a cardiac MRI on Mallory Sutton back in 2003.  At that time, Mallory Sutton  minimal luminal diameter at the recoarctation site was only 9 to 10 mm.   Mallory Sutton aorta has measured 42 mm in the past in the ascending aorta.   Mallory Sutton last echo that I have in the chart was from April of 2006 as well.  At that time, she had a bicuspid valve with mild aortic stenosis, and  mild aortic regurgitation.   Peak aortic velocity was 2.95 meters per second with a peak gradient of  34 mmHg and a mean gradient of 21.   In talking to Mallory Sutton, she has not had any significant syncope, chest  pain, PND, or orthopnea.  There have been no palpitations.   She has been seen in the past by Dr. Renard Matter, but she really has not had  a chest x-ray or seen any medical physician in 2 years.   Mallory Sutton current medications include Rythmol 150 four times a day.  I believe  we wanted to have Mallory Sutton on  225, and it was a lot cheaper for Mallory Sutton to take  the 150 mg tablets.   I will have to look back through my records in regards to Mallory Sutton Rythmol  therapy.  She has paroxysmal atrial fibrillation in the past, and I  believe this is why she is on the medicine.   She saw Dr. Ladona Ridgel in January of 2006 and he agreed with reestablishing  the t.i.d. Rythmol.   Mallory Sutton medications currently include Toprol 25 mg a a day, Norvasc 2.5 a  day, and Rythmol 150 four times a day.   Exam is remarkable for healthy-appearing, middle-aged white female in no  distress.  Weight is 121 which is stable.  Blood pressure is 148/80 in the left  arm, 128/80 in the right arm, pulse is 80  and regular.  Respiratory rate  is 14.  She is afebrile.  HEENT:  Normal.  Carotids have no parvus and no tardus.  There is a  transmitted murmur.  No JVP elevation, no lymphadenopathy, no  thyromegaly.  Mallory Sutton lungs were clear with good diaphragmatic motion, no wheezing.  There is an S1 with an opening snap and a loud 2nd heart sound.  She has  an AS murmur, an aortic insufficiency murmur.  PMI is mildly increased  but not displaced.  ABDOMEN:  Benign.  There is no triple A.  No hepatosplenomegaly, no  hepatojugular reflux.  Bowel sounds are positive.  There is no  tenderness.  She does have a transmitted murmur to the back which  probably represents Mallory Sutton coarctation.  I did not check for radial femoral  delay, but Mallory Sutton PTs are +3.   Mallory Sutton electrocardiogram today shows sinus bradycardia at a rate of 59 with  left axis deviation.   There is borderline LVH.  We did the 2D echocardiogram on Mallory Sutton today.  Mallory Sutton EF was 65%.  Mallory Sutton septal thickness was only 11 mm.  She had a  bicuspid aortic valve with mild aortic insufficiency.  Peak velocity was  mildly increased from 2006 at 3.4 meters per second with a peak gradient  of 45 mm, mean gradient of 25 mm.  She clearly had moderate aortic root  dilatation above the sinus Valsalva, measuring  approximately  4.3 mm.   IMPRESSION:  1. Precoarctation by echo today.  She appears to have a 2-1/2 to 3      meters per second jet which would indicate approximately 35 to 40      mmHg gradient.  This is audible on exam.  The combination of this      with Mallory Sutton bicuspid aortic valve and moderate AS is causing      increasing aortic root dilatation.  Will have to try to find some      way to image Mallory Sutton aorta in regards to the ascending root above the      sinus Valsalva to make sure she is not approaching 5 to 5-1/2 cm.      Of course, a cardiac CT would be ideal, and involve imaging the      entire aortic root area of coarctation, the bicuspid valve, as well      as Mallory Sutton coronary arteries.  We may be able to do this as part of a      research trial since she has congenital heart disease.  2. Aortic stenosis and aortic insufficiency, currently stable.  No      symptoms, followup echo in a year.  3. History of paroxysmal atrial fibrillation, continue Rythmol, QT      interval only 386.  4. Blood pressure, currently well controlled, continue Norvasc and      Toprol.   Overall, it was nice to see [name] after a 2-year absence.  I am glad she  is doing well.  We did do a chest x-ray today as well.  I was surprised  that she only appeared to have mild aortic root dilatation.  There was  no notching of the ribs.  Mallory Sutton ventricular cavity size was normal, and,  overall, things appeared stable by, at least, AP and lateral chest x-  ray.    Mallory Sutton. Mallory Emms, MD, [facility]  Electronically Signed   PCN/MedQ  DD: 11/25/2006  DT: 11/26/2006  Job #: [id number]

## 2010-09-23 ENCOUNTER — Other Ambulatory Visit: Payer: Self-pay | Admitting: Cardiovascular Disease

## 2010-10-13 ENCOUNTER — Other Ambulatory Visit: Payer: Self-pay | Admitting: Cardiovascular Disease

## 2010-11-11 ENCOUNTER — Other Ambulatory Visit: Payer: Self-pay | Admitting: Cardiovascular Disease

## 2010-12-10 ENCOUNTER — Other Ambulatory Visit: Payer: Self-pay | Admitting: Cardiovascular Disease

## 2010-12-19 ENCOUNTER — Other Ambulatory Visit: Payer: Self-pay | Admitting: *Deleted

## 2010-12-19 MED ORDER — METOPROLOL SUCCINATE ER 25 MG PO TB24: 25.0000 mg | ORAL_TABLET | Freq: Every day | ORAL | Status: DC

## 2011-07-02 ENCOUNTER — Telehealth: Payer: Self-pay | Admitting: Cardiovascular Disease

## 2011-07-02 ENCOUNTER — Encounter: Payer: Self-pay | Admitting: *Deleted

## 2011-07-02 NOTE — Telephone Encounter (Signed)
Fu call Pt said please put cover sheet with her name when faxing to insurance company

## 2011-07-02 NOTE — Telephone Encounter (Signed)
New Msg: Pt calling wanting to speak with nurse about pt needing confirmation of pt DX. Please return pt call to discuss further.

## 2011-07-02 NOTE — Telephone Encounter (Signed)
SPOKE WITH PT NEEDS NAME AND APP  NUMBER ALONG WITH DIAGNOSIS SENT TO  [facility]  WILL FAX  AT PT'S REQUEST  TO [phone]  WITH COVER LETTER   AND NOTE SEE LETTERS.

## 2011-07-10 ENCOUNTER — Other Ambulatory Visit: Payer: Self-pay

## 2011-07-10 ENCOUNTER — Other Ambulatory Visit: Payer: Self-pay | Admitting: Internal Medicine

## 2011-07-10 MED ORDER — PROPAFENONE HCL 150 MG PO TABS: 150.0000 mg | ORAL_TABLET | Freq: Three times a day (TID) | ORAL | Status: DC

## 2011-07-21 ENCOUNTER — Other Ambulatory Visit: Payer: Self-pay | Admitting: *Deleted

## 2011-07-21 ENCOUNTER — Telehealth: Payer: Self-pay | Admitting: Cardiovascular Disease

## 2011-07-21 DIAGNOSIS — Q231 Congenital insufficiency of aortic valve: Secondary | ICD-10-CM

## 2011-07-21 DIAGNOSIS — Q251 Coarctation of aorta: Secondary | ICD-10-CM

## 2011-07-21 NOTE — Telephone Encounter (Signed)
Pt calling to set up appt with nishan, said she also needs an echo, can she get an order?

## 2011-07-21 NOTE — Telephone Encounter (Signed)
Echo ordered and scheduled along with appt to see Dr. Buzzy Han.

## 2011-08-04 ENCOUNTER — Other Ambulatory Visit: Payer: Self-pay

## 2011-08-04 ENCOUNTER — Ambulatory Visit (HOSPITAL_COMMUNITY): Payer: PRIVATE HEALTH INSURANCE | Attending: Cardiology

## 2011-08-04 DIAGNOSIS — Q231 Congenital insufficiency of aortic valve: Secondary | ICD-10-CM

## 2011-08-04 DIAGNOSIS — I079 Rheumatic tricuspid valve disease, unspecified: Secondary | ICD-10-CM | POA: Insufficient documentation

## 2011-08-04 DIAGNOSIS — I080 Rheumatic disorders of both mitral and aortic valves: Secondary | ICD-10-CM | POA: Insufficient documentation

## 2011-08-04 DIAGNOSIS — Q251 Coarctation of aorta: Secondary | ICD-10-CM

## 2011-08-04 DIAGNOSIS — I712 Thoracic aortic aneurysm, without rupture, unspecified: Secondary | ICD-10-CM | POA: Insufficient documentation

## 2011-08-21 ENCOUNTER — Ambulatory Visit (HOSPITAL_COMMUNITY): Payer: PRIVATE HEALTH INSURANCE | Attending: Cardiovascular Disease

## 2011-08-21 DIAGNOSIS — R0989 Other specified symptoms and signs involving the circulatory and respiratory systems: Secondary | ICD-10-CM

## 2011-08-26 ENCOUNTER — Encounter: Payer: Self-pay | Admitting: *Deleted

## 2011-08-27 ENCOUNTER — Encounter: Payer: Self-pay | Admitting: *Deleted

## 2011-08-27 ENCOUNTER — Ambulatory Visit (INDEPENDENT_AMBULATORY_CARE_PROVIDER_SITE_OTHER): Payer: PRIVATE HEALTH INSURANCE | Admitting: Cardiovascular Disease

## 2011-08-27 ENCOUNTER — Encounter: Payer: Self-pay | Admitting: Cardiovascular Disease

## 2011-08-27 VITALS — BP 112/76 | HR 56 | Ht 64.0 in | Wt 138.0 lb

## 2011-08-27 DIAGNOSIS — I712 Thoracic aortic aneurysm, without rupture: Secondary | ICD-10-CM

## 2011-08-27 DIAGNOSIS — R110 Nausea: Secondary | ICD-10-CM

## 2011-08-27 DIAGNOSIS — I729 Aneurysm of unspecified site: Secondary | ICD-10-CM

## 2011-08-27 DIAGNOSIS — I498 Other specified cardiac arrhythmias: Secondary | ICD-10-CM

## 2011-08-27 DIAGNOSIS — I359 Nonrheumatic aortic valve disorder, unspecified: Secondary | ICD-10-CM

## 2011-08-27 DIAGNOSIS — I499 Cardiac arrhythmia, unspecified: Secondary | ICD-10-CM

## 2011-08-27 DIAGNOSIS — Q251 Coarctation of aorta: Secondary | ICD-10-CM

## 2011-08-27 MED ORDER — PROMETHAZINE HCL 25 MG PO TABS: 25.0000 mg | ORAL_TABLET | Freq: Four times a day (QID) | ORAL | Status: DC | PRN

## 2011-08-27 NOTE — Patient Instructions (Addendum)
Your physician recommends that you schedule a follow-up appointment in: 1-2 WEEKS AFTER CARDIAC CT  ALLOW  30 MIN  OFFICE VISIT Your physician recommends that you continue on your current medications as directed. Please refer to the Current Medication list given to you today. Your physician has requested that you have cardiac CT. Cardiac computed tomography (CT) is a painless test that uses an x-ray machine to take clear, detailed pictures of your heart. For further information please visit [email or web address] Please follow instruction sheet as given.  DX COARTATION OF AORTA   ANEURYSM

## 2011-08-27 NOTE — Assessment & Plan Note (Signed)
Hemodynamically this is probably her worse lesion.  CTA to evaluate MLD.  Will send records to Dr Tomasa Blase at [facility] for his advice I dont think she needs surgery at this point but when the time comes a combined AVR root replacement with bypass to the descending aorta would be the best approach and [facility] has the most experience with this.

## 2011-08-27 NOTE — Assessment & Plan Note (Signed)
Ascending aorta 4.5 cm  F/U cardiac CT to reevaluate in light of bicuspid valve

## 2011-08-27 NOTE — Assessment & Plan Note (Signed)
Bicuspid valve stable moderate gradients.

## 2011-08-27 NOTE — Progress Notes (Signed)
Patient ID: Mallory Sutton, female   DOB: [birth year], 51 y.o.   MRN: [id number] 51 yo complex cardiac issues.  Followed since 99.  Had coarctation repair at young age ? 9 with left lateral thoracotomy.  W/U in past has been limited by lack of insurance. She is symptomatically doing fine with no dyspnea, chest pain palpitations or pre syncope.  She has a known bicuspid AV with ascending aortic aneurysm and re-coarctation.  Most recent echo done 08/04/11 showed  EF 55-65% with moderate LVH Moderate AS mean gradient 68mmHg/peak bicuspid valve and mild AR Coarctation gradient mean 15 mmHg and peak 30 mmHg  CT done 02/26/10  Ascending aorta 4.5 cm persistant left SVC and minimal luminal coarctation diameter 11 mm  Calcium score was zero with no CAD  I have consulted Dr Tomasa Blase at the [facility] clinic.  He has a series of patients that he has replaced the AVR/root and secured a bypass to the descending Thoracic aorta to fix both issues with one operation.  I dont think Sandia needs surgery quite at this point in regard to her aneurysm or AV gradient  However I would like to reassess the coarctation anatomically by CT as I think her gradients are underestimated given known MLD.  ROS: Denies fever, malais, weight loss, blurry vision, decreased visual acuity, cough, sputum, SOB, hemoptysis, pleuritic pain, palpitaitons, heartburn, abdominal pain, melena, lower extremity edema, claudication, or rash.  All other systems reviewed and negative  General: Affect appropriate Healthy:  appears stated age HEENT: normal Neck supple with no adenopathy JVP normal no bruits no thyromegaly Lungs clear with no wheezing and good diaphragmatic motion Heart:  S1/S2 no murmur, no rub, gallop or click PMI normal Abdomen: benighn, BS positve, no tenderness, no AAA no bruit.  No HSM or HJR Distal pulses intact with no bruits No edema Neuro non-focal Skin warm and dry No muscular weakness   Current Outpatient  Prescriptions  Medication Sig Dispense Refill  . Coenzyme Q10 (COQ-10 PO) Take 1 tablet by mouth daily.      Marland Kitchen lisinopril (PRINIVIL,ZESTRIL) 5 MG tablet TAKE 1 TABLET BY MOUTH EVERY DAY  30 tablet  9  . Melatonin 10 MG CAPS Take 1 tablet by mouth daily.      . metoprolol succinate (TOPROL XL) 25 MG 24 hr tablet Take 1 tablet (25 mg total) by mouth daily.  30 tablet  11  . promethazine (PHENERGAN) 25 MG tablet Take 1 tablet (25 mg total) by mouth every 6 (six) hours as needed.  30 tablet  1  . propafenone (RYTHMOL) 150 MG tablet Take 1 tablet (150 mg total) by mouth 3 (three) times daily.  90 tablet  2  . DISCONTD: promethazine (PHENERGAN) 25 MG tablet Take 25 mg by mouth every 6 (six) hours as needed.          Allergies  Penicillins  Electrocardiogram:  SR rate 56 LAD low voltage  Assessment and Plan

## 2011-09-09 ENCOUNTER — Ambulatory Visit (HOSPITAL_COMMUNITY)
Admission: RE | Admit: 2011-09-09 | Discharge: 2011-09-09 | Disposition: A | Discharge status: Home / Self Care | Payer: PRIVATE HEALTH INSURANCE | Source: Ambulatory Visit | Attending: Cardiovascular Disease | Admitting: Cardiovascular Disease

## 2011-09-09 ENCOUNTER — Other Ambulatory Visit: Payer: PRIVATE HEALTH INSURANCE

## 2011-09-09 DIAGNOSIS — I712 Thoracic aortic aneurysm, without rupture, unspecified: Secondary | ICD-10-CM | POA: Insufficient documentation

## 2011-09-09 DIAGNOSIS — Q231 Congenital insufficiency of aortic valve: Secondary | ICD-10-CM | POA: Insufficient documentation

## 2011-09-09 DIAGNOSIS — Q269 Congenital malformation of great vein, unspecified: Secondary | ICD-10-CM | POA: Insufficient documentation

## 2011-09-09 DIAGNOSIS — I359 Nonrheumatic aortic valve disorder, unspecified: Secondary | ICD-10-CM | POA: Insufficient documentation

## 2011-09-09 DIAGNOSIS — Q251 Coarctation of aorta: Secondary | ICD-10-CM | POA: Insufficient documentation

## 2011-09-09 DIAGNOSIS — I729 Aneurysm of unspecified site: Secondary | ICD-10-CM

## 2011-09-09 MED ORDER — IOHEXOL 350 MG/ML SOLN
100.0000 mL | Freq: Once | INTRAVENOUS | Status: AC | PRN
  Administered 2011-09-09: 100 mL via INTRAVENOUS

## 2011-09-09 MED ORDER — NITROGLYCERIN 0.4 MG SL SUBL
SUBLINGUAL_TABLET | SUBLINGUAL | Status: AC
  Administered 2011-09-09: 0.4 mg
  Filled 2011-09-09: qty 25

## 2011-09-09 MED ORDER — METOPROLOL TARTRATE 1 MG/ML IV SOLN
INTRAVENOUS | Status: AC
  Administered 2011-09-09: 2.5 mg
  Filled 2011-09-09: qty 5

## 2011-09-17 ENCOUNTER — Other Ambulatory Visit: Payer: Self-pay | Admitting: Cardiovascular Disease

## 2011-10-13 ENCOUNTER — Ambulatory Visit (INDEPENDENT_AMBULATORY_CARE_PROVIDER_SITE_OTHER): Payer: PRIVATE HEALTH INSURANCE | Admitting: Cardiovascular Disease

## 2011-10-13 ENCOUNTER — Encounter: Payer: Self-pay | Admitting: Cardiovascular Disease

## 2011-10-13 VITALS — BP 122/75 | HR 65 | Wt 137.0 lb

## 2011-10-13 DIAGNOSIS — I712 Thoracic aortic aneurysm, without rupture: Secondary | ICD-10-CM

## 2011-10-13 DIAGNOSIS — I359 Nonrheumatic aortic valve disorder, unspecified: Secondary | ICD-10-CM

## 2011-10-13 DIAGNOSIS — Q251 Coarctation of aorta: Secondary | ICD-10-CM

## 2011-10-13 NOTE — Assessment & Plan Note (Signed)
Related to intrinsic aortopathy (bicuspid valve) AS and coarctation.  Stable size less than 5.0 cm.

## 2011-10-13 NOTE — Progress Notes (Signed)
Patient ID: Mallory Sutton, female   DOB: 01-01-1961, 51 y.o.   MRN: [id number] 51 yo complex cardiac issues. Followed since 99. Had coarctation repair at young age ? 9 with left lateral thoracotomy. W/U in past has been limited by lack of insurance. She is symptomatically doing fine with no dyspnea, chest pain palpitations or pre syncope. She has a known bicuspid AV with ascending aortic aneurysm and re-coarctation. Most recent echo done 08/04/11 showed  EF 55-65% with moderate LVH Moderate AS mean gradient 59mmHg/peak bicuspid valve and mild AR  Coarctation gradient mean 15 mmHg and peak 30 mmHg  CT done 02/26/10  Ascending aorta 4.5 cm persistant left SVC and minimal luminal coarctation diameter 11 mm Calcium score was zero with no CAD  I have consulted Dr Tomasa Blase at the [facility] clinic. He has a series of patients that he has replaced the AVR/root and secured a bypass to the descending  Thoracic aorta to fix both issues with one operation. I dont think Nakaila needs surgery quite at this point in regard to her aneurysm or AV gradient However  I would like to reassess the coarctation anatomically by CT as I think her gradients are underestimated given known MLD.  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 Gradient by TTE are  mean of 15 mmHg and peak of 30 mmHg.  Gradients and dimensions remarkably stable over last 2 years.  Have send data to Dr Tomasa Blase for review at [facility]  ROS: Denies fever, malais, weight loss, blurry vision, decreased visual acuity, cough, sputum, SOB, hemoptysis, pleuritic pain, palpitaitons, heartburn, abdominal pain, melena, lower extremity edema, claudication, or rash.  All other systems reviewed and negative  General: Affect appropriate Healthy:  appears stated age HEENT: normal Neck supple with no adenopathy JVP normal no bruits no thyromegaly Lungs clear with no wheezing and good diaphragmatic motion S/P left thoracotomy Heart:  S1/S2 AS  murmur, no rub, gallop or click  Coarctation murmur in back PMI normal Abdomen: benighn, BS positve, no tenderness, no AAA no bruit.  No HSM or HJR Distal pulses intact with no bruits No edema Neuro non-focal Skin warm and dry No muscular weakness   Current Outpatient Prescriptions  Medication Sig Dispense Refill  . Coenzyme Q10 (COQ-10 PO) Take 1 tablet by mouth daily.      Marland Kitchen lisinopril (PRINIVIL,ZESTRIL) 5 MG tablet TAKE 1 TABLET BY MOUTH EVERY DAY  30 tablet  9  . Melatonin 10 MG CAPS Take 1 tablet by mouth daily.      . metoprolol succinate (TOPROL XL) 25 MG 24 hr tablet Take 1 tablet (25 mg total) by mouth daily.  30 tablet  11  . promethazine (PHENERGAN) 25 MG tablet Take 1 tablet (25 mg total) by mouth every 6 (six) hours as needed.  30 tablet  1  . propafenone (RYTHMOL) 150 MG tablet Take 1 tablet (150 mg total) by mouth 3 (three) times daily.  90 tablet  2    Allergies  Penicillins  Electrocardiogram: 4/13  SR rate 56 insignificant Q;s in inferior leads  Assessment and Plan

## 2011-10-13 NOTE — Assessment & Plan Note (Signed)
May be her most significant pathology.  At some point will require AVR with valve conduit and bypass to descending thoracic aorta Review by Dr Elton Sin at [facility]

## 2011-10-13 NOTE — Assessment & Plan Note (Signed)
Bicuspid valve stable gradients not surgical

## 2011-10-13 NOTE — Patient Instructions (Signed)
Your physician recommends that you continue on your current medications as directed. Please refer to the Current Medication list given to you today.  Your physician wants you to follow-up in: 6 months with Dr. Nishan. You will receive a reminder letter in the mail two months in advance. If you don't receive a letter, please call our office to schedule the follow-up appointment.  

## 2011-10-19 ENCOUNTER — Other Ambulatory Visit: Payer: Self-pay | Admitting: Internal Medicine

## 2011-12-15 ENCOUNTER — Other Ambulatory Visit: Payer: Self-pay | Admitting: Cardiovascular Disease

## 2012-01-14 ENCOUNTER — Other Ambulatory Visit: Payer: Self-pay | Admitting: Cardiovascular Disease

## 2012-02-15 ENCOUNTER — Other Ambulatory Visit: Payer: Self-pay | Admitting: Cardiovascular Disease

## 2012-03-21 ENCOUNTER — Other Ambulatory Visit: Payer: Self-pay | Admitting: *Deleted

## 2012-03-21 MED ORDER — METOPROLOL SUCCINATE ER 25 MG PO TB24: 25.0000 mg | ORAL_TABLET | Freq: Every day | ORAL | Status: DC

## 2012-03-21 MED ORDER — PROPAFENONE HCL 150 MG PO TABS: 150.0000 mg | ORAL_TABLET | Freq: Three times a day (TID) | ORAL | Status: DC

## 2012-07-15 ENCOUNTER — Telehealth: Payer: Self-pay | Admitting: *Deleted

## 2012-07-15 ENCOUNTER — Other Ambulatory Visit: Payer: Self-pay | Admitting: *Deleted

## 2012-07-15 MED ORDER — METOPROLOL SUCCINATE ER 25 MG PO TB24: 25.0000 mg | ORAL_TABLET | Freq: Every day | ORAL | Status: DC

## 2012-07-15 MED ORDER — LISINOPRIL 5 MG PO TABS: ORAL_TABLET | ORAL | Status: DC

## 2012-07-15 MED ORDER — PROPAFENONE HCL 150 MG PO TABS: 150.0000 mg | ORAL_TABLET | Freq: Three times a day (TID) | ORAL | Status: DC

## 2012-07-15 NOTE — Telephone Encounter (Signed)
Pt is aware she is due for an appointment with Dr. Eden Emms. She will call office later to schedule.  Refills also sent in for Metoprolol and Rhythmol  Mylo Red RN

## 2012-07-18 ENCOUNTER — Other Ambulatory Visit: Payer: Self-pay | Admitting: *Deleted

## 2012-07-18 MED ORDER — PROPAFENONE HCL 150 MG PO TABS: 150.0000 mg | ORAL_TABLET | Freq: Three times a day (TID) | ORAL | Status: DC

## 2012-12-01 ENCOUNTER — Ambulatory Visit (INDEPENDENT_AMBULATORY_CARE_PROVIDER_SITE_OTHER): Payer: BC Managed Care – PPO | Admitting: Cardiovascular Disease

## 2012-12-01 ENCOUNTER — Encounter: Payer: Self-pay | Admitting: Cardiovascular Disease

## 2012-12-01 VITALS — BP 115/80 | HR 59 | Wt 145.0 lb

## 2012-12-01 DIAGNOSIS — Q231 Congenital insufficiency of aortic valve: Secondary | ICD-10-CM

## 2012-12-01 DIAGNOSIS — I712 Thoracic aortic aneurysm, without rupture, unspecified: Secondary | ICD-10-CM

## 2012-12-01 DIAGNOSIS — I350 Nonrheumatic aortic (valve) stenosis: Secondary | ICD-10-CM

## 2012-12-01 DIAGNOSIS — Q251 Coarctation of aorta: Secondary | ICD-10-CM

## 2012-12-01 DIAGNOSIS — I4891 Unspecified atrial fibrillation: Secondary | ICD-10-CM

## 2012-12-01 DIAGNOSIS — I498 Other specified cardiac arrhythmias: Secondary | ICD-10-CM

## 2012-12-01 DIAGNOSIS — R110 Nausea: Secondary | ICD-10-CM

## 2012-12-01 DIAGNOSIS — I359 Nonrheumatic aortic valve disorder, unspecified: Secondary | ICD-10-CM

## 2012-12-01 MED ORDER — PROMETHAZINE HCL 25 MG PO TABS: 25.0000 mg | ORAL_TABLET | Freq: Four times a day (QID) | ORAL | Status: DC | PRN

## 2012-12-01 NOTE — Assessment & Plan Note (Signed)
Stable 4.5 cm f/u CT in a year

## 2012-12-01 NOTE — Patient Instructions (Addendum)

## 2012-12-01 NOTE — Progress Notes (Signed)
Patient ID: Mallory Sutton, female   DOB: 08-23-1960, 52 y.o.   MRN: [id number] 52 yo complex cardiac issues. Followed since 99. Had coarctation repair at young age ? 9 with left lateral thoracotomy. W/U in past has been limited by lack of insurance. She is symptomatically doing fine with no dyspnea, chest pain palpitations or pre syncope. She has a known bicuspid AV with ascending aortic aneurysm and re-coarctation. Most recent echo done 08/04/11 showed  No dyspnea palpitations or chest pain.  Needs refill on Phenergan.  Daughter is living at home and studying psychology at [employer].  Son is in 10 th grade at [employer].  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  Findings: Review of the lung windows demonstrate no focal nodule or abnormality. No pleural effusions.  Again noted is the ascending aortic aneurysm measuring maximally 4.4 cm, stable since prior study. Area of coarctation again iStudy Conclusions   dentified in the proximal descending thoracic aorta, also stable. Duplicated SVC is noted.  No mediastinal, hilar, or axillary adenopathy. Normal chest wall soft tissues. No acute bony abnormality.  IMPRESSION: Essentially stable ascending thoracic aortic aneurysm, measuring maximally 4.4 cm compared 4.5 cm previously. Stable coarctation of the proximal descending thoracic aorta.  Cardiac CT:  Indication: Morphology Bicuspid AV, Ascending aortic Aneurysm and previous Coarctation Repair.  Comparison Study: 02/26/10 CT  Protocol: The patient was scanned on a Philips 256 scanner. 5mg  of iv lopresser was given with s.l. nitro. Average HR during scan was 62 bpm. Gantry rotation speed was 270 msec with collimation of .9mm. A retrospective 100 kV study was done to assess  AV morphology and function. The patient received 80cc of contrast with the scan triggered in the descending thoracic aorta at 111 HU's. The 3D data set was analyzed on a Philips work station using VRT,MIP and MPR modes.  Findings:  Calcium Score: not done but no calcium seen in arteries on axial images  Coronary Arteries:  LM-normal LAD-normal D1- normal D2-normal  Circumflex- nondominant and normal  OM1- large and normal  RCA: dominant and normal including PDA/ multiple PLA branches  Aortic Valve: Bicuspid with mild calcification. Area by planimetry over 1.5 cm2. By echo the mean gradient across the valve was and peak gradient 51 mmHg.  Aorta: Severe dilatation of the ascending aortic root particularly at the level of the RPA. Orthogonal measurements are similar to scan done in 2011. 4.5 x 4.4 cm There is no discection, mural hematoma or ulceration. The great vessels arise normally from the arch. Just distal to the left subclavian there is re-coarctation of the aorta. The minimal luminal diameter is 11mm with an area of .9cm2. By echo the mean gradient from the suprasternal notch was and the peak 30 mmHg. By comparison the descending thoracic aorta measures 1.7cm.  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 Normal size and function. 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 Gradient by  TTE are mean of 15 mmHg and peak of 30 mmHg. 7) Charlton Haws MD [facility]  ROS: Denies fever, malais, weight loss, blurry vision, decreased visual acuity, cough, sputum, SOB, hemoptysis, pleuritic pain, palpitaitons, heartburn, abdominal pain, melena, lower extremity edema, claudication, or rash.  All other systems reviewed and negative  General: Affect appropriate Healthy:  appears stated age HEENT: normal Neck supple with no adenopathy JVP normal no bruits no thyromegaly Lungs clear with no wheezing and good diaphragmatic motion Heart:  S1/S2 AS and coarctation   murmur, no rub, gallop or click PMI normal Abdomen: benighn, BS positve, no tenderness, no AAA no bruit.  No HSM or HJR Distal pulses intact with no bruits No edema Neuro non-focal Skin warm and dry No muscular weakness   Current Outpatient Prescriptions  Medication Sig Dispense Refill  . Coenzyme Q10 (COQ-10 PO) Take 1 tablet by mouth daily.      Marland Kitchen lisinopril (PRINIVIL,ZESTRIL) 5 MG tablet TAKE 1 TABLET BY MOUTH EVERY DAY  30 tablet  9  . Melatonin 10 MG CAPS Take 1 tablet by mouth daily.      . metoprolol succinate (TOPROL-XL) 25 MG 24 hr tablet Take 1 tablet (25 mg total) by mouth daily.  30 tablet  11  . promethazine (PHENERGAN) 25 MG tablet Take 1 tablet (25 mg total) by mouth every 6 (six) hours as needed.  30 tablet  1  . propafenone (RYTHMOL) 150 MG tablet Take 1 tablet (150 mg total) by mouth every 8 (eight) hours.  90 tablet  11   No current facility-administered medications for this visit.    Allergies  Penicillins  Electrocardiogram:  SR rate 59  LAD ? Old IMI poor R wave progression   Assessment and Plan

## 2012-12-01 NOTE — Assessment & Plan Note (Signed)
F/u echo no change in murmur  Surgical correction would involve Bental with bypass to descending thoracic aorta

## 2012-12-01 NOTE — Assessment & Plan Note (Signed)
Maint NSR at some point may have to lower or stop beta blocker.  QT 388 today

## 2012-12-01 NOTE — Assessment & Plan Note (Signed)
Moderate stenosis F/U echo gradients have been stable

## 2012-12-12 ENCOUNTER — Ambulatory Visit (HOSPITAL_COMMUNITY): Payer: BC Managed Care – PPO | Attending: Internal Medicine | Admitting: Radiology

## 2012-12-12 DIAGNOSIS — Q251 Coarctation of aorta: Secondary | ICD-10-CM

## 2012-12-12 DIAGNOSIS — R0989 Other specified symptoms and signs involving the circulatory and respiratory systems: Secondary | ICD-10-CM | POA: Insufficient documentation

## 2012-12-12 DIAGNOSIS — R0609 Other forms of dyspnea: Secondary | ICD-10-CM | POA: Insufficient documentation

## 2012-12-12 DIAGNOSIS — I350 Nonrheumatic aortic (valve) stenosis: Secondary | ICD-10-CM

## 2012-12-12 DIAGNOSIS — Q231 Congenital insufficiency of aortic valve: Secondary | ICD-10-CM | POA: Insufficient documentation

## 2012-12-12 NOTE — Progress Notes (Signed)
Echocardiogram performed.  

## 2013-01-03 ENCOUNTER — Encounter: Payer: Self-pay | Admitting: Cardiovascular Disease

## 2013-03-09 ENCOUNTER — Other Ambulatory Visit: Payer: Self-pay

## 2013-06-08 ENCOUNTER — Telehealth: Payer: Self-pay | Admitting: Cardiovascular Disease

## 2013-06-08 NOTE — Telephone Encounter (Signed)
Pt calls today b/c yesterday afternoon she experienced an episode of arrthymia where her heart "felt like it was beating rapidly and irregularly.It felt like the bed was shaking. It happened all of the sudden"  Pt states she has recently gotten over a cold. States she tried to "wait it out" but 65minutes had passed and it wasn't letting up. States she took her Rhythmol and within 1 hour of the onset of her symptoms she was relieved. States she was going to call EMS but call her neighbor Best boy at [facility]) instead who monitored her until this event subsided. Pt stated also her blood pressure was normal and heart rate normal after the Rhythmol kicked in.  She asked about taking the Rhythmol early.  Reviewed about taking it earlier as well as if needed taking the Toprol early. Takes Toprol at  Bedtime.   She said this was the first incident she has had in 14 years. She has appointment with Dr. [name] next week. No episodes today. If pt experiences another episode she will call EMS to check her out & if possible take her Rhythmol early Will forward to Dr. [name] for review.

## 2013-06-08 NOTE — Telephone Encounter (Signed)
New message     Want to talk to the nurse about an episode of arrhythmia she had yesterday.

## 2013-06-08 NOTE — Telephone Encounter (Signed)
Likely PAF  Continue rhythmol and see me next week.

## 2013-06-15 ENCOUNTER — Ambulatory Visit (INDEPENDENT_AMBULATORY_CARE_PROVIDER_SITE_OTHER): Payer: BC Managed Care – PPO | Admitting: Cardiovascular Disease

## 2013-06-15 ENCOUNTER — Ambulatory Visit (HOSPITAL_COMMUNITY): Payer: BC Managed Care – PPO | Attending: Cardiology | Admitting: Radiology

## 2013-06-15 VITALS — BP 152/80 | HR 96 | Ht 64.0 in | Wt 145.8 lb

## 2013-06-15 DIAGNOSIS — I498 Other specified cardiac arrhythmias: Secondary | ICD-10-CM

## 2013-06-15 DIAGNOSIS — I350 Nonrheumatic aortic (valve) stenosis: Secondary | ICD-10-CM

## 2013-06-15 DIAGNOSIS — I712 Thoracic aortic aneurysm, without rupture, unspecified: Secondary | ICD-10-CM

## 2013-06-15 DIAGNOSIS — Q231 Congenital insufficiency of aortic valve: Secondary | ICD-10-CM

## 2013-06-15 DIAGNOSIS — I059 Rheumatic mitral valve disease, unspecified: Secondary | ICD-10-CM | POA: Insufficient documentation

## 2013-06-15 DIAGNOSIS — Q251 Coarctation of aorta: Secondary | ICD-10-CM

## 2013-06-15 DIAGNOSIS — I359 Nonrheumatic aortic valve disorder, unspecified: Secondary | ICD-10-CM

## 2013-06-15 NOTE — Assessment & Plan Note (Signed)
In setting of bicuspid valve she is in a worrisome range.  However has been stable at 4.5 cm

## 2013-06-15 NOTE — Progress Notes (Signed)
Patient ID: Mallory Sutton, female   DOB: 07-15-1960, 53 y.o.   MRN: [id number] 53 yo complex cardiac issues. Followed since 99. Had coarctation repair at young age ? 9 with left lateral thoracotomy. W/U in past has been limited by lack of insurance. She has insurance now .   She has a known bicuspid AV with ascending aortic aneurysm and re-coarctation.  She had a bad episode of likely PAF a few weeks ago  With rapid palpitations Lasted about an hour She did not go to ER  She has been on rhythmol which has prevented PAF for a long time.  Her echo in August showed progression of gradient with Mean 41 mmHg now severe AS.  She denies dyspnea or chest pain   Daughter is living at home and studying psychology at [employer]. Son is in 10 th grade at [employer]. 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 Wall thickness was increased in a pattern of mild LVH. Systolic function was normal. The estimated ejection fraction was in the range of 60% to 65%. - Aortic valve: AV is difficult to see well It is thickened, calcified with restricted motion. Peak and mean gradients through the valve are 61 and41 mm Hg respectively consistent with moderate to severe AS. Mean gradient is increased from echoin 2013 - Aorta: Descending aorta at area of coarc with peak gradient of 34 mm Hg (2.9 cm/sec)  Cardiac CT: 09/16/11  Indication: Morphology Bicuspid AV, Ascending aortic Aneurysm and previous Coarctation Repair.  Comparison Study: 02/26/10 CT  Findings:  Calcium Score: not done but no calcium seen in arteries on axial Images  Coronary Arteries:  LM-normal LAD-normal D1- normal D2-normal  Circumflex- nondominant and normal  OM1- large and normal  RCA: dominant and normal including PDA/ multiple PLA branches  Aortic Valve: Bicuspid with mild calcification. Area by planimetry over  1.5 cm2. By echo the mean gradient across the valve was 64mmHg and peak gradient 51 mmHg.  Aorta: Severe dilatation of the ascending aortic root particularly at the level of the RPA. Orthogonal measurements are similar to scan done in 2011. 4.5 x 4.4 cm There is no discection, mural hematoma or ulceration. The great vessels arise normally from the arch. Just distal to the left subclavian there is re-coarctation of the aorta. The minimal luminal diameter is 61mm with an area of .9cm2. By echo the mean gradient from the suprasternal notch was 67mmHg and the peak 30 mmHg. By comparison the descending thoracic aorta measures 1.7cm.  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 Normal size and function. 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 Gradient by TTE are mean of 15 mmHg and peak of 30 mmHg.      ROS: Denies fever, malais, weight loss, blurry vision, decreased  visual acuity, cough, sputum, SOB, hemoptysis, pleuritic pain, palpitaitons, heartburn, abdominal pain, melena, lower extremity edema, claudication, or rash.  All other systems reviewed and negative  General: Affect appropriate Healthy:  appears stated age 29: normal Neck supple with no adenopathy JVP normal no bruits no thyromegaly Lungs clear with no wheezing and good diaphragmatic motion Heart:  S1/S2 no murmur, no rub, gallop or click PMI  normal Abdomen: benighn, BS positve, no tenderness, no AAA no bruit.  No HSM or HJR Distal pulses intact with no bruits No edema Neuro non-focal Skin warm and dry No muscular weakness   Current Outpatient Prescriptions  Medication Sig Dispense Refill  . Coenzyme Q10 (COQ-10 PO) Take 1 tablet by mouth daily.      Marland Kitchen lisinopril (PRINIVIL,ZESTRIL) 5 MG tablet TAKE 1 TABLET BY MOUTH EVERY DAY  30 tablet  9  . Melatonin 10 MG CAPS Take 1 tablet by mouth daily.      . metoprolol succinate (TOPROL-XL) 25 MG 24 hr tablet Take 1 tablet (25 mg total) by mouth daily.  30 tablet  11  . promethazine (PHENERGAN) 25 MG tablet Take 1 tablet (25 mg total) by mouth every 6 (six) hours as needed.  30 tablet  3  . propafenone (RYTHMOL) 150 MG tablet Take 1 tablet (150 mg total) by mouth every 8 (eight) hours.  90 tablet  11   No current facility-administered medications for this visit.    Allergies  Penicillins  Electrocardiogram:  SR LAD poor R wave progression   Assessment and Plan

## 2013-06-15 NOTE — Assessment & Plan Note (Signed)
Severe AS  F/U echo today  Refer to [facility] Dr Cathi Roan who has experience replacing aortic valve and root with extra cardiac gortex graft from ascending aorta to descending aorta for coarctation.  Will copy CT and echos and have patient go to [facility] for consult next month or so

## 2013-06-15 NOTE — Patient Instructions (Signed)
Your physician recommends that you schedule a follow-up appointment in:   3 MONTHS WITH  DR NISHAN Your physician recommends that you continue on your current medications as directed. Please refer to the Current Medication list given to you today.  Your physician has requested that you have an echocardiogram. Echocardiography is a painless test that uses sound waves to create images of your heart. It provides your doctor with information about the size and shape of your heart and how well your heart's chambers and valves are working. This procedure takes approximately one hour. There are no restrictions for this procedure.  

## 2013-06-15 NOTE — Progress Notes (Signed)
Echocardiogram performed.  

## 2013-06-15 NOTE — Assessment & Plan Note (Signed)
History of PAF likely recurrence continue rhythmol

## 2013-06-15 NOTE — Assessment & Plan Note (Signed)
F/U echo today Severe recoarctation with area less then 1cm2 by previous MRI/CT.  Previous left thoracotomy.  See below for AS Would like to perform one surgery to correct both problems with extra cardiac graft from ascending aorta to descending

## 2013-06-19 ENCOUNTER — Other Ambulatory Visit: Payer: Self-pay | Admitting: Cardiovascular Disease

## 2013-06-21 ENCOUNTER — Telehealth: Payer: Self-pay | Admitting: Cardiovascular Disease

## 2013-06-21 DIAGNOSIS — I350 Nonrheumatic aortic (valve) stenosis: Secondary | ICD-10-CM

## 2013-06-21 DIAGNOSIS — Q251 Coarctation of aorta: Secondary | ICD-10-CM

## 2013-06-21 NOTE — Telephone Encounter (Signed)
New message  ° ° °Returning call back to nurse.  °

## 2013-06-23 NOTE — Telephone Encounter (Signed)
Follow up  ° ° °Patient calling for test results.  °

## 2013-06-23 NOTE — Telephone Encounter (Signed)
Pt is aware of the ECHO results & referral to Dr. Mali Hughes at [facility]. Order for the referral placed & message sent to pcc Horton Chin RN

## 2013-06-26 ENCOUNTER — Encounter: Payer: Self-pay | Admitting: Cardiovascular Disease

## 2013-06-26 DIAGNOSIS — I4891 Unspecified atrial fibrillation: Secondary | ICD-10-CM

## 2013-07-10 MED ORDER — PROPAFENONE HCL 225 MG PO TABS: 225.0000 mg | ORAL_TABLET | Freq: Three times a day (TID) | ORAL | Status: DC

## 2013-07-10 NOTE — Telephone Encounter (Signed)
PT  AWARE  SPOKE  WITH  DR Mali  HUGHES  OFFICE    WILL CALL PT  THIS  WEEK WITH   APPT  ALSO  PT  HAS BEEN TAKING  RYTHMOL 150 MG   TID  WILL  INCREASE  TO 225 MG  TID  PER  DR NISHAN  AS  WELL AS MONITOR  ORDER ENTERED  AND  WILL LET  KELLY KNOW  PT  NEEDS  APPT  WITH EP  DX AFIB./CY

## 2013-07-20 ENCOUNTER — Other Ambulatory Visit: Payer: Self-pay | Admitting: Cardiovascular Disease

## 2013-07-21 ENCOUNTER — Encounter: Payer: Self-pay | Admitting: Internal Medicine

## 2013-07-21 ENCOUNTER — Ambulatory Visit (INDEPENDENT_AMBULATORY_CARE_PROVIDER_SITE_OTHER): Payer: BC Managed Care – PPO | Admitting: Internal Medicine

## 2013-07-21 VITALS — BP 120/78 | HR 61 | Ht 64.0 in | Wt 144.0 lb

## 2013-07-21 DIAGNOSIS — I359 Nonrheumatic aortic valve disorder, unspecified: Secondary | ICD-10-CM

## 2013-07-21 DIAGNOSIS — I498 Other specified cardiac arrhythmias: Secondary | ICD-10-CM

## 2013-07-21 DIAGNOSIS — I350 Nonrheumatic aortic (valve) stenosis: Secondary | ICD-10-CM

## 2013-07-21 NOTE — Progress Notes (Signed)
      HPI Mallory Sutton returns after a long absence from our arrhythmia clinic. She is a pleasant middle aged woman with a repaired coarct at age 53 with a left sided SVC who has had PAF over the past 15 years, well controlled with as needed rhythmol, now taken daily. In the interim, her aortic valve and root has worsened and she is pending valve/aorta replacement and repair of re-coarct. The patient notes that her atrial fib has increased in frequency and severity. She is not taking 225 mg three times daily. She is considering having her valve surgery at [facility] next month.  Allergies  Allergen Reactions  . Penicillins      Current Outpatient Prescriptions  Medication Sig Dispense Refill  . Coenzyme Q10 (COQ-10 PO) Take 1 tablet by mouth daily.      Marland Kitchen lisinopril (PRINIVIL,ZESTRIL) 5 MG tablet TAKE 1 TABLET BY MOUTH DAILY  30 tablet  3  . Melatonin 10 MG CAPS Take 1 tablet by mouth daily.      . metoprolol succinate (TOPROL-XL) 25 MG 24 hr tablet TAKE 1 TABLET BY MOUTH EVERY DAY  30 tablet  1  . promethazine (PHENERGAN) 25 MG tablet Take 1 tablet (25 mg total) by mouth every 6 (six) hours as needed.  30 tablet  3  . propafenone (RYTHMOL) 225 MG tablet Take 1 tablet (225 mg total) by mouth every 8 (eight) hours.  90 tablet  6   No current facility-administered medications for this visit.     Past Medical History  Diagnosis Date  . Bicuspid aortic valve   . Aortic stenosis   . Paroxysmal atrial fibrillation   . ATRIAL ARRHYTHMIAS   . THORACIC AORTIC ANEURYSM   . COARCTATION OF AORTA   . ENDOMETRIOSIS   . Shortness of breath     ROS:   All systems reviewed and negative except as noted in the HPI.   Past Surgical History  Procedure Laterality Date  . Coarctation repair and residual restenosis    . Knee surgery       No family history on file.   History   Social History  . Marital Status: Married    Spouse Name: N/A    Number of Children: N/A  . Years of Education: N/A     Occupational History  . Not on file.   Social History Main Topics  . Smoking status: Never Smoker   . Smokeless tobacco: Not on file  . Alcohol Use: No  . Drug Use: Not on file  . Sexual Activity: Not on file   Other Topics Concern  . Not on file   Social History Narrative  . No narrative on file     BP 120/78  Pulse 61  Ht 5\' 4"  (1.626 m)  Wt 144 lb (65.318 kg)  BMI 24.71 kg/m2  SpO2 99%  Physical Exam:  Well appearing middle aged woman, NAD HEENT: Unremarkable Neck:  No JVD, no thyromegally Back:  No CVA tenderness Lungs:  Clear HEART:  Regular rate rhythm, soft systolic murmurs in right upper sternal border, no rubs, no clicks Abd:  soft, positive bowel sounds, no organomegally, no rebound, no guarding Ext:  2 plus pulses, no edema, no cyanosis, no clubbing Skin:  No rashes no nodules Neuro:  CN II through XII intact, motor grossly intact  EKG - NSR, normal QRS, low voltage, left axis.   Assess/Plan:

## 2013-07-21 NOTE — Assessment & Plan Note (Signed)
Her atrial fib has worsened and requires more anti-arrhythmic therapy. I have recommended that approx. 2 weeks prior to surgery, she stop rhythmol, start amiodarone and strongly consider surgical MAZE. I have warned her that she may ultimately require PPM. Post op atrial fib will be difficult for her.

## 2013-07-21 NOTE — Patient Instructions (Signed)
Your physician recommends that you schedule a follow-up appointment in: 6 months with Dr Taylor You will receive a reminder letter two months in advance reminding you to call and schedule your appointment. If you don't receive this letter, please contact our office.  Your physician recommends that you continue on your current medications as directed. Please refer to the Current Medication list given to you today.   

## 2013-09-12 ENCOUNTER — Encounter: Payer: Self-pay | Admitting: Cardiovascular Disease

## 2013-09-12 ENCOUNTER — Ambulatory Visit (INDEPENDENT_AMBULATORY_CARE_PROVIDER_SITE_OTHER): Payer: BC Managed Care – PPO | Admitting: Cardiovascular Disease

## 2013-09-12 VITALS — BP 122/90 | HR 70 | Ht 64.0 in | Wt 144.8 lb

## 2013-09-12 DIAGNOSIS — I712 Thoracic aortic aneurysm, without rupture, unspecified: Secondary | ICD-10-CM

## 2013-09-12 DIAGNOSIS — I359 Nonrheumatic aortic valve disorder, unspecified: Secondary | ICD-10-CM

## 2013-09-12 DIAGNOSIS — I498 Other specified cardiac arrhythmias: Secondary | ICD-10-CM

## 2013-09-12 NOTE — Patient Instructions (Signed)
Your physician recommends that you schedule a follow-up appointment in:  3 MONTHS WITH  DR NISHAN  Your physician recommends that you continue on your current medications as directed. Please refer to the Current Medication list given to you today.  

## 2013-09-12 NOTE — Progress Notes (Signed)
Pt  AWARE  AND  HAS   WROTE  HERSELF   NOTE    RE  MED  CHANGE PRIOR  TO  SURGERY INFORMED  PT  TO CALL  ONCE SURGERY  SCHEDULED  SO  WE  MAY   CALL IN  THE  SCRIPT FOR  AMIODARONE 400 MG  BID PT  VERBALIZED UNDERSTANDING .Adonis Housekeeper

## 2013-09-12 NOTE — Assessment & Plan Note (Signed)
4.5 cm in setting of bicuspid AV needs to be replaced  [name] with excellent experience with complicated arch cases

## 2013-09-12 NOTE — Assessment & Plan Note (Signed)
Complicated case Increased fatigue and dyspnea with more frequent afib  Increased AS gradient and known aortic aneurysm and re coarctation Discussed personally with [name] Agree with single surgery with mechanical AVR to replace valve/aneurysm, MAZE and ascending to descending bypass for coarctation.  Model used and pictures drawn patient understands mechanics of surgery.  She has a left sided SVC and anesthesia needs to know about this for retrograde cardioplegia.

## 2013-09-12 NOTE — Progress Notes (Signed)
Patient ID: Mallory Sutton, female   DOB: 01-Jan-1961, 53 y.o.   MRN: [id number] 53 yo complex cardiac issues. Followed since 99. Had coarctation repair at age 30  with left lateral thoracotomy. W/U in past has been limited by lack of insurance. She has insurance now . She has a known bicuspid AV with ascending aortic aneurysm and re-coarctation. She had a bad episode of likely PAF a few weeks ago With rapid palpitations Lasted about an hour She did not go to ER She has been on rhythmol which has prevented PAF for a long time. Her echo in August showed progression of gradient with Mean 41 mmHg now severe AS. She denies dyspnea or chest pain  Daughter is living at home and studying psychology at [employer]. Son is in 10 th grade at [employer]. Husband still trucking with lots of routes to [place]   Echo August 2014 with progression of AS  Study Conclusions  - Left ventricle: The cavity size was normal. Wall thickness was increased in a pattern of mild LVH. Systolic function was normal. The estimated ejection fraction was in the range of 60% to 65%. - Aortic valve: AV is difficult to see well It is thickened, calcified with restricted motion. Peak and mean gradients through the valve are 61 and41 mm Hg respectively consistent with moderate to severe AS. Mean gradient is increased from echoin 2013 - Aorta: Descending aorta at area of coarc with peak gradient of 34 mm Hg (2.9 cm/sec)  Cardiac CT: 09/16/11  Indication: Morphology Bicuspid AV, Ascending aortic Aneurysm and previous Coarctation Repair.  Comparison Study: 02/26/10 CT  Findings:  Calcium Score: not done but no calcium seen in arteries on axial Images   Coronary Arteries:  LM-normal LAD-normal D1- normal D2-normal  Circumflex- nondominant and normal  OM1- large and normal  RCA: dominant and normal including PDA/ multiple PLA branches  Aortic Valve: Bicuspid with mild calcification. Area by planimetry over 1.5 cm2. By echo the  mean gradient across the valve was 12mmHg and peak gradient 51 mmHg.  Aorta: Severe dilatation of the ascending aortic root particularly at the level of the RPA. Orthogonal measurements are similar to scan done in 2011. 4.5 x 4.4 cm There is no discection, mural hematoma or ulceration. The great vessels arise normally from the arch. Just distal to the left subclavian there is re-coarctation of the aorta. The minimal luminal diameter is 20mm with an area of .9cm2. By echo the mean gradient from the suprasternal notch was 73mmHg and the peak 30 mmHg. By comparison the descending thoracic aorta measures 1.7cm.  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 Normal size and function. 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 Gradient by TTE are mean of 15 mmHg and peak of 30 mmHg.  Se[facility] by Mali [facility] at [facility] 4/15  Will have surgery next month.  Discussed having AVR with aneurysm resection, MAZE and ascending  to descending aortic conduit For coractation She is hesitant to have mechanical valve but I strongly encouraged this given her age.  Will have cath day before surgery       ROS: Denies fever, malais, weight loss, blurry vision, decreased visual acuity, cough, sputum, SOB, hemoptysis, pleuritic pain, palpitaitons, heartburn, abdominal pain, melena, lower extremity edema, claudication, or rash.  All other systems  reviewed and negative  General: Affect appropriate Healthy:  appears stated age 25: normal Neck supple with no adenopathy JVP normal no bruits no thyromegaly Lungs clear with no wheezing and good diaphragmatic motion Heart:  S1/S2 no murmur, no rub, gallop or click PMI normal Abdomen: benighn, BS positve, no tenderness, no AAA no bruit.  No HSM or HJR Distal pulses intact with no bruits No edema Neuro non-focal Skin warm and dry No muscular weakness   Current Outpatient Prescriptions  Medication Sig Dispense Refill  . Coenzyme Q10 (COQ-10 PO) Take 1 tablet by mouth daily.      Marland Kitchen lisinopril (PRINIVIL,ZESTRIL) 5 MG tablet TAKE 1 TABLET BY MOUTH DAILY  30 tablet  3  . Melatonin 10 MG CAPS Take 1 tablet by mouth daily.      . metoprolol succinate (TOPROL-XL) 25 MG 24 hr tablet TAKE 1 TABLET BY MOUTH EVERY DAY  30 tablet  1  . promethazine (PHENERGAN) 25 MG tablet Take 1 tablet (25 mg total) by mouth every 6 (six) hours as needed.  30 tablet  3  . propafenone (RYTHMOL) 225 MG tablet Take 1 tablet (225 mg total) by mouth every 8 (eight) hours.  90 tablet  6   No current facility-administered medications for this visit.    Allergies  Penicillins  Electrocardiogram:  SR low voltage ? Old IMI  Assessment and Plan

## 2013-09-12 NOTE — Assessment & Plan Note (Signed)
Per Dr Lovena Le discussed.  Stop rhythmol and start amiodarone 2 weeks before surgery and perform surgical maze

## 2013-09-18 ENCOUNTER — Other Ambulatory Visit: Payer: Self-pay | Admitting: Cardiovascular Disease

## 2013-10-20 ENCOUNTER — Other Ambulatory Visit: Payer: Self-pay | Admitting: Cardiovascular Disease

## 2013-12-19 ENCOUNTER — Other Ambulatory Visit: Payer: Self-pay | Admitting: Cardiovascular Disease

## 2014-01-17 ENCOUNTER — Other Ambulatory Visit: Payer: Self-pay | Admitting: Cardiovascular Disease

## 2014-02-21 ENCOUNTER — Other Ambulatory Visit: Payer: Self-pay | Admitting: Cardiovascular Disease

## 2014-03-20 ENCOUNTER — Encounter (HOSPITAL_COMMUNITY): Payer: Self-pay | Admitting: Emergency Medicine

## 2014-03-20 ENCOUNTER — Emergency Department (HOSPITAL_COMMUNITY): Payer: BC Managed Care – PPO

## 2014-03-20 ENCOUNTER — Emergency Department (HOSPITAL_COMMUNITY)
Admission: EM | Admit: 2014-03-20 | Discharge: 2014-03-20 | Disposition: A | Discharge status: Home / Self Care | Payer: BC Managed Care – PPO | Attending: Emergency Medicine | Admitting: Emergency Medicine

## 2014-03-20 DIAGNOSIS — I4891 Unspecified atrial fibrillation: Secondary | ICD-10-CM

## 2014-03-20 DIAGNOSIS — Z880 Allergy status to penicillin: Secondary | ICD-10-CM | POA: Insufficient documentation

## 2014-03-20 DIAGNOSIS — Z87448 Personal history of other diseases of urinary system: Secondary | ICD-10-CM | POA: Diagnosis not present

## 2014-03-20 DIAGNOSIS — Z8774 Personal history of (corrected) congenital malformations of heart and circulatory system: Secondary | ICD-10-CM | POA: Diagnosis not present

## 2014-03-20 DIAGNOSIS — I480 Paroxysmal atrial fibrillation: Secondary | ICD-10-CM | POA: Diagnosis not present

## 2014-03-20 DIAGNOSIS — Z79899 Other long term (current) drug therapy: Secondary | ICD-10-CM | POA: Insufficient documentation

## 2014-03-20 DIAGNOSIS — R0602 Shortness of breath: Secondary | ICD-10-CM | POA: Diagnosis not present

## 2014-03-20 DIAGNOSIS — R000 Tachycardia, unspecified: Secondary | ICD-10-CM | POA: Diagnosis present

## 2014-03-20 DIAGNOSIS — R002 Palpitations: Secondary | ICD-10-CM

## 2014-03-20 LAB — CBC WITH DIFFERENTIAL/PLATELET
BASOS ABS: 0.1 10*3/uL (ref 0.0–0.1)
Basophils Relative: 1 % (ref 0–1)
Eosinophils Absolute: 0.2 10*3/uL (ref 0.0–0.7)
Eosinophils Relative: 2 % (ref 0–5)
HEMATOCRIT: 46.5 % — AB (ref 36.0–46.0)
HEMOGLOBIN: 16.3 g/dL — AB (ref 12.0–15.0)
Lymphocytes Relative: 30 % (ref 12–46)
Lymphs Abs: 2.1 10*3/uL (ref 0.7–4.0)
MCH: 32.9 pg (ref 26.0–34.0)
MCHC: 35.1 g/dL (ref 30.0–36.0)
MCV: 93.9 fL (ref 78.0–100.0)
MONO ABS: 0.8 10*3/uL (ref 0.1–1.0)
MONOS PCT: 11 % (ref 3–12)
NEUTROS ABS: 3.9 10*3/uL (ref 1.7–7.7)
Neutrophils Relative %: 56 % (ref 43–77)
Platelets: 370 10*3/uL (ref 150–400)
RBC: 4.95 MIL/uL (ref 3.87–5.11)
RDW: 13.5 % (ref 11.5–15.5)
WBC: 7 10*3/uL (ref 4.0–10.5)

## 2014-03-20 LAB — COMPREHENSIVE METABOLIC PANEL
ALBUMIN: 3.6 g/dL (ref 3.5–5.2)
ALT: 16 U/L (ref 0–35)
ANION GAP: 17 — AB (ref 5–15)
AST: 22 U/L (ref 0–37)
Alkaline Phosphatase: 73 U/L (ref 39–117)
BILIRUBIN TOTAL: 0.4 mg/dL (ref 0.3–1.2)
BUN: 6 mg/dL (ref 6–23)
CALCIUM: 9.3 mg/dL (ref 8.4–10.5)
CHLORIDE: 101 meq/L (ref 96–112)
CO2: 22 mEq/L (ref 19–32)
CREATININE: 0.87 mg/dL (ref 0.50–1.10)
GFR calc Af Amer: 87 mL/min — ABNORMAL LOW (ref >90)
GFR, EST NON AFRICAN AMERICAN: 75 mL/min — AB (ref >90)
Glucose, Bld: 85 mg/dL (ref 70–99)
Potassium: 4.1 mEq/L (ref 3.7–5.3)
Sodium: 140 mEq/L (ref 137–147)
Total Protein: 7.3 g/dL (ref 6.0–8.3)

## 2014-03-20 LAB — TROPONIN I

## 2014-03-20 MED ORDER — METOPROLOL SUCCINATE ER 25 MG PO TB24: 25.0000 mg | ORAL_TABLET | Freq: Two times a day (BID) | ORAL | Status: DC

## 2014-03-20 MED ORDER — METOPROLOL TARTRATE 25 MG PO TABS
25.0000 mg | ORAL_TABLET | Freq: Once | ORAL | Status: AC
  Administered 2014-03-20: 25 mg via ORAL
  Filled 2014-03-20: qty 1

## 2014-03-20 NOTE — ED Notes (Signed)
PT woke up this morning with fluttering in her chest and SOB on exertion. PT reports hx of afib and.

## 2014-03-20 NOTE — Discharge Instructions (Signed)
Atrial Fibrillation  Atrial fibrillation is a condition that causes your heart to beat irregularly. It may also cause your heart to beat faster than normal. Atrial fibrillation can prevent your heart from pumping blood normally. It increases your risk of stroke and heart problems.  HOME CARE  · Take medications as told by your doctor.  · Only take medications that your doctor says are safe. Some medications can make the condition worse or happen again.  · If blood thinners were prescribed by your doctor, take them exactly as told. Too much can cause bleeding. Too little and you will not have the needed protection against stroke and other problems.  · Perform blood tests at home if told by your doctor.  · Perform blood tests exactly as told by your doctor.  · Do not drink alcohol.  · Do not drink beverages with caffeine such as coffee, soda, and some teas.  · Maintain a healthy weight.  · Do not use diet pills unless your doctor says they are safe. They may make heart problems worse.  · Follow diet instructions as told by your doctor.  · Exercise regularly as told by your doctor.  · Keep all follow-up appointments.  GET HELP IF:  · You notice a change in the speed, rhythm, or strength of your heartbeat.  · You suddenly begin peeing (urinating) more often.  · You get tired more easily when moving or exercising.  GET HELP RIGHT AWAY IF:   · You have chest or belly (abdominal) pain.  · You feel sick to your stomach (nauseous).  · You are short of breath.  · You suddenly have swollen feet and ankles.  · You feel dizzy.  · You face, arms, or legs feel numb or weak.  · There is a change in your vision or speech.  MAKE SURE YOU:   · Understand these instructions.  · Will watch your condition.  · Will get help right away if you are not doing well or get worse.  Document Released: 01/28/2008 Document Revised: 09/04/2013 Document Reviewed: 05/31/2012  ExitCare® Patient Information ©2015 [employer]. This information is not  intended to replace advice given to you by your health care provider. Make sure you discuss any questions you have with your health care provider.

## 2014-03-20 NOTE — ED Provider Notes (Signed)
CSN: [id number]     Arrival date & time 03/20/14  6789 History  This chart was scribed for Sharyon Cable, MD by Edison Simon, ED Scribe. This patient was seen in room APA14/APA14 and the patient's care was started at 9:30 AM.    Chief Complaint  Patient presents with  . Tachycardia   Patient is a 53 y.o. female presenting with palpitations. The history is provided by the patient. No language interpreter was used.  Palpitations Palpitations quality:  Fast Onset quality:  Sudden Duration:  2 hours Timing:  Constant Progression:  Improving Chronicity:  Recurrent Context comment:  Shortly after waking Relieved by:  Nothing Worsened by:  Nothing tried Ineffective treatments:  None tried Associated symptoms: shortness of breath   Associated symptoms: no back pain, no chest pain, no cough and no vomiting   Risk factors: heart disease and hx of atrial fibrillation     HPI Comments: Mallory Sutton is a 53 y.o. female with history of atrial fibrillation, bicuspid aortic valve, aortic stenosis who presents to the Emergency Department complaining of tachycardia and SOB with onset at 0730 this morning. She states she woke up, went to wake her son up, then noticed her symptoms. She then took a dose of her Rythmol, which she normally takes at 0800, 1600, and 2400. She states her symptoms have improved somewhat at this time. She states she does not take blood thinners, but will after surgery in February by Dr. Mali Hughes at [facility]. Her cardiologist is Dr. [name] and she also sees Dr. Lovena Le for her atrial fibrillation. She last saw Dr. [name] in May. She denies chest pain, back pain, syncope, fever, vomiting, diarrhea, weakness, cough, or pain anywhere. She states she has felt well the past few days.  Past Medical History  Diagnosis Date  . Bicuspid aortic valve   . Aortic stenosis   . Paroxysmal atrial fibrillation   . ATRIAL ARRHYTHMIAS   . THORACIC AORTIC ANEURYSM   . COARCTATION OF AORTA   .  ENDOMETRIOSIS   . Shortness of breath    Past Surgical History  Procedure Laterality Date  . Coarctation repair and residual restenosis    . Knee surgery     No family history on file. History  Substance Use Topics  . Smoking status: Never Smoker   . Smokeless tobacco: Not on file  . Alcohol Use: No   OB History    No data available     Review of Systems  Constitutional: Negative for fever.  Respiratory: Positive for shortness of breath. Negative for cough.   Cardiovascular: Positive for palpitations (tachycardic). Negative for chest pain.  Gastrointestinal: Negative for vomiting and diarrhea.  Musculoskeletal: Negative for back pain.  Neurological: Negative for syncope and weakness.  All other systems reviewed and are negative.     Allergies  Penicillins  Home Medications   Prior to Admission medications   Medication Sig Start Date End Date Taking? Authorizing Provider  Coenzyme Q10 (COQ-10 PO) Take 1 tablet by mouth daily.    Historical Provider, MD  lisinopril (PRINIVIL,ZESTRIL) 5 MG tablet TAKE 1 TABLET BY MOUTH EVERY DAY 02/22/14   Josue Hector, MD  Melatonin 10 MG CAPS Take 1 tablet by mouth daily.    Historical Provider, MD  metoprolol succinate (TOPROL-XL) 25 MG 24 hr tablet TAKE 1 TABLET BY MOUTH EVERY DAY 02/22/14   Josue Hector, MD  promethazine (PHENERGAN) 25 MG tablet Take 1 tablet (25 mg total) by mouth every  6 (six) hours as needed. 12/01/12   Josue Hector, MD  propafenone (RYTHMOL) 225 MG tablet TAKE 1 TABLET BY MOUTH EVERY 8 HOURS 02/22/14   Josue Hector, MD   BP 123/87 mmHg  Pulse 102  Temp(Src) 97.8 F (36.6 C) (Oral)  Resp 18  Ht 5\' 4"  (1.626 m)  Wt 125 lb (56.7 kg)  BMI 21.45 kg/m2  SpO2 100%  LMP 07/16/2013 Physical Exam  Nursing note and vitals reviewed.  CONSTITUTIONAL: Well developed/well nourished HEAD: Normocephalic/atraumatic EYES: EOMI/PERRL ENMT: Mucous membranes moist NECK: supple no meningeal signs SPINE/BACK:entire  spine nontender CV: irregular, no loud murmurs LUNGS: Lungs are clear to auscultation bilaterally, no apparent distress ABDOMEN: soft, nontender, no rebound or guarding, bowel sounds noted throughout abdomen GU:no cva tenderness NEURO: Pt is awake/alert/appropriate, moves all extremitiesx4.  No facial droop.   EXTREMITIES: pulses normal/equal, full ROM SKIN: warm, color normal PSYCH: no abnormalities of mood noted, alert and oriented to situation  ED Course  Procedures   DIAGNOSTIC STUDIES: Oxygen Saturation is 100% on room air, normal by my interpretation.    COORDINATION OF CARE: 9:38 AM Discussed treatment plan with patient at beside, the patient agrees with the plan and has no further questions at this time.   11:11 AM D/w dr Josie Dixon cardiology He requests one dose of metoprolol 25mg  PO He will review chart and call back 11:23 AM Dr Bronson Ing recommends Toprol XL 50mg  AM and 25mg  PM No anticoagulation at this time Will arrange f/u in one week  Pt improved Now back in sinus rhythm She would like to go home She will take an extra toprol XL 25mg  (HR in 60s, she does not want to take 50mg  in the morning) Discussed need for outpatient followup She reports she is supposed to have AV repair in February 2016    Labs Review Labs Reviewed  CBC WITH DIFFERENTIAL - Abnormal; Notable for the following:    Hemoglobin 16.3 (*)    HCT 46.5 (*)    All other components within normal limits  COMPREHENSIVE METABOLIC PANEL - Abnormal; Notable for the following:    GFR calc non Af Amer 75 (*)    GFR calc Af Amer 87 (*)    Anion gap 17 (*)    All other components within normal limits  TROPONIN I    Imaging Review Dg Chest Portable 1 View  03/20/2014   CLINICAL DATA:  53 year old female atrial fibrillation. Shortness of breath on exertion. Palpitations. Initial encounter.  EXAM: PORTABLE CHEST - 1 VIEW  COMPARISON:  Chest CTA 09/09/2011 and earlier.  FINDINGS: Portable AP upright  view at 0946 hrs. Mildly lower lung volumes. Stable cardiac size and mediastinal contours, chronic enlargement of the ascending aortic contour. No pneumothorax, pulmonary edema, pleural effusion or confluent pulmonary opacity.  IMPRESSION: No acute cardiopulmonary abnormality. Chronic ascending aortic aneurysm.   Electronically Signed   By: Lars Pinks M.D.   On: 03/20/2014 10:44     EKG Interpretation   Date/Time:  Tuesday March 20 2014 09:24:34 EST Ventricular Rate:  98 PR Interval:  145 QRS Duration: 99 QT Interval:  375 QTC Calculation: 479 R Axis:   -51 Text Interpretation:  Sinus tachycardia Ventricular premature complex  Inferior infarct, old Anterior infarct, old Confirmed by Christy Gentles  MD,  [place] ([id number]) on 03/20/2014 9:27:57 AM      EKG Interpretation  Date/Time:  Tuesday March 20 2014 10:37:09 EST Ventricular Rate:  116 PR Interval:  145 QRS Duration: 93  QT Interval:  350 QTC Calculation: 486 R Axis:   -51 Text Interpretation:  Atrial flutter with predominant 2:1 AV block Probable left ventricular hypertrophy Inferior infarct, old Anterior Q waves, possibly due to LVH Confirmed by Christy Gentles  MD, Doctor Phillips ([id number]) on 03/20/2014 10:42:56 AM        EKG Interpretation  Date/Time:  Tuesday March 20 2014 12:14:19 EST Ventricular Rate:  62 PR Interval:  165 QRS Duration: 85 QT Interval:  433 QTC Calculation: 440 R Axis:   -43 Text Interpretation:  Sinus rhythm Inferior infarct, old Anterior infarct, old changes noted from prior Confirmed by Christy Gentles  MD, [place] ([id number]) on 03/20/2014 12:17:18 PM         MDM   Final diagnoses:  Tachycardia  Palpitations  Paroxysmal atrial fibrillation    Nursing notes including past medical history and social history reviewed and considered in documentation xrays/imaging reviewed by myself and considered during evaluation Labs/vital reviewed myself and considered during evaluation Previous records reviewed and  considered   I personally performed the services described in this documentation, which was scribed in my presence. The recorded information has been reviewed and is accurate.      Sharyon Cable, MD 03/20/14 [phone]

## 2014-03-20 NOTE — ED Notes (Signed)
C/o SOB on exertion

## 2014-03-20 NOTE — ED Notes (Signed)
PCXR done

## 2014-03-20 NOTE — ED Notes (Signed)
MD at bedside. 

## 2014-03-26 ENCOUNTER — Ambulatory Visit (INDEPENDENT_AMBULATORY_CARE_PROVIDER_SITE_OTHER): Payer: BC Managed Care – PPO | Admitting: *Deleted

## 2014-03-26 ENCOUNTER — Encounter: Payer: Self-pay | Admitting: Internal Medicine

## 2014-03-26 ENCOUNTER — Ambulatory Visit (INDEPENDENT_AMBULATORY_CARE_PROVIDER_SITE_OTHER): Payer: BC Managed Care – PPO | Admitting: Internal Medicine

## 2014-03-26 VITALS — BP 118/80 | HR 62 | Ht 64.0 in | Wt 132.0 lb

## 2014-03-26 DIAGNOSIS — Q231 Congenital insufficiency of aortic valve: Secondary | ICD-10-CM

## 2014-03-26 DIAGNOSIS — I10 Essential (primary) hypertension: Secondary | ICD-10-CM

## 2014-03-26 DIAGNOSIS — Z23 Encounter for immunization: Secondary | ICD-10-CM

## 2014-03-26 DIAGNOSIS — I480 Paroxysmal atrial fibrillation: Secondary | ICD-10-CM

## 2014-03-26 DIAGNOSIS — Q251 Coarctation of aorta: Secondary | ICD-10-CM

## 2014-03-26 MED ORDER — METOPROLOL SUCCINATE ER 25 MG PO TB24: 25.0000 mg | ORAL_TABLET | Freq: Two times a day (BID) | ORAL | Status: DC

## 2014-03-26 MED ORDER — PROPAFENONE HCL 225 MG PO TABS: 225.0000 mg | ORAL_TABLET | Freq: Three times a day (TID) | ORAL | Status: DC

## 2014-03-26 MED ORDER — LISINOPRIL 5 MG PO TABS: 5.0000 mg | ORAL_TABLET | Freq: Every day | ORAL | Status: DC

## 2014-03-26 NOTE — Progress Notes (Signed)
HPI 53 yo with history of  Coarctation, s/p  repair at age 44 with left lateral thoracotomy. W/U in past has been limited by lack of insurance. She has insurance now . She has a known bicuspid AV with ascending aortic aneurysm and re-coarctation. She also has a history of PAF  Followed by Beckie Salts as well.  She hass been on Rhythmol in the past. Her echo in August showed progression of gradient with Mean 41 mmHg now severe AS.   Seen in ER on 11/17 in afib  After discussion with Dr Bronson Ing the patinet was given toprol XL  She agreed to take 25 XL bid    SInce last week has had 3 dizzy spells  All last less than a min.   No syncope  No fatigue   No CP  Breathing OK    Allergies  Allergen Reactions  . Penicillins     Current Outpatient Prescriptions  Medication Sig Dispense Refill  . Coenzyme Q10 (COQ-10 PO) Take 1 tablet by mouth daily.    Marland Kitchen lisinopril (PRINIVIL,ZESTRIL) 5 MG tablet TAKE 1 TABLET BY MOUTH EVERY DAY 30 tablet 0  . Melatonin 10 MG CAPS Take 1 tablet by mouth daily.    . metoprolol succinate (TOPROL-XL) 25 MG 24 hr tablet Take 1 tablet (25 mg total) by mouth 2 (two) times daily. 28 tablet 0  . propafenone (RYTHMOL) 225 MG tablet TAKE 1 TABLET BY MOUTH EVERY 8 HOURS 90 tablet 0   No current facility-administered medications for this visit.    Past Medical History  Diagnosis Date  . Bicuspid aortic valve   . Aortic stenosis   . Paroxysmal atrial fibrillation   . ATRIAL ARRHYTHMIAS   . THORACIC AORTIC ANEURYSM   . COARCTATION OF AORTA   . ENDOMETRIOSIS   . Shortness of breath     Past Surgical History  Procedure Laterality Date  . Coarctation repair and residual restenosis    . Knee surgery      No family history on file.  History   Social History  . Marital Status: Married    Spouse Name: N/A    Number of Children: N/A  . Years of Education: N/A   Occupational History  . Not on file.   Social History Main Topics  . Smoking status: Never Smoker    . Smokeless tobacco: Never Used  . Alcohol Use: No  . Drug Use: No  . Sexual Activity: No   Other Topics Concern  . Not on file   Social History Narrative    Review of Systems:  All systems reviewed.  They are negative to the above problem except as previously stated.  Vital Signs: BP 118/80 mmHg  Pulse 62  Ht 5\' 4"  (1.626 m)  Wt 132 lb (59.875 kg)  BMI 22.65 kg/m2  LMP 07/16/2013  Physical Exam Patinet is in NAD   HEENT:  Normocephalic, atraumatic. EOMI, PERRLA.  Neck: JVP is normal.  No bruits.  Lungs: clear to auscultation. No rales no wheezes.  Heart: Regular rate and rhythm. Normal S1, S2. No S3.   Gr II/VI systolic murmur later peaking at base  . PMI not displaced.  Abdomen:  Supple, nontender. Normal bowel sounds. No masses. No hepatomegaly.  Extremities:   Good distal pulses throughout. No lower extremity edema.  Musculoskeletal :moving all extremities.  Neuro:   alert and oriented x3.  CN II-XII grossly intact.   Assessment and Plan:  1.  Atrial fibrillation  Recent  spell  Converted before arrival to ER  Very symptomatic.  B BLocker was increased Continue current regimen  Not on anticoagulation  2.  Bicuspid AV/aortic aneurysm.  Ptinet has been seen by Dr Mali Hughes at [facility] for surgery in Feb  She does not have an appt before had Concerning about the few dizzy spells last wk  She says happened right after afib converted  First couple days  If she has any other spells I have told her to call Discussed activity, fluid, salt  3.  Coarctation of the aorta  S/p repair age 58  Now with narrowing.  Again, f/u at [facility]

## 2014-03-26 NOTE — Patient Instructions (Signed)
Your physician recommends that you schedule a follow-up appointment in: Dr.Nishan in [place] in late December or early January    Your physician recommends that you continue on your current medications as directed. Please refer to the Current Medication list given to you today.      Thank you for choosing [facility] [place] [facility] !

## 2014-04-26 NOTE — Progress Notes (Signed)
Patient ID: Mallory Sutton, female   DOB: 1960/10/07, 53 y.o.   MRN: [id number] 53 yo complex cardiac issues. Followed since 99. Had coarctation repair at age 65 with left lateral thoracotomy. W/U in past has been limited by lack of insurance. She has insurance now . She has a known bicuspid AV with ascending aortic aneurysm and re-coarctation. She had a bad episode of likely PAF a few weeks ago With rapid palpitations Lasted about an hour She did not go to ER She has been on rhythmol which has prevented PAF for a long time. Her echo in August showed progression of gradient with Mean 41 mmHg now severe AS. She denies dyspnea or chest pain  Daughter is living at home and studying psychology at [employer]. Son is in 10 th grade at [employer]. Husband still trucking with lots of routes to [place]   Echo August 2014 with progression of AS  Study Conclusions  - Left ventricle: The cavity size was normal. Wall thickness was increased in a pattern of mild LVH. Systolic function was normal. The estimated ejection fraction was in the range of 60% to 65%. - Aortic valve: AV is difficult to see well It is thickened, calcified with restricted motion. Peak and mean gradients through the valve are 61 and41 mm Hg respectively consistent with moderate to severe AS. Mean gradient is increased from echoin 2013 - Aorta: Descending aorta at area of coarc with peak gradient of 34 mm Hg (2.9 cm/sec)  Cardiac CT: 09/16/11  Indication: Morphology Bicuspid AV, Ascending aortic Aneurysm and previous Coarctation Repair.  Comparison Study: 02/26/10 CT  Findings:  Calcium Score: not done but no calcium seen in arteries on axial Images   Coronary Arteries:  LM-normal LAD-normal D1- normal D2-normal  Circumflex- nondominant and normal  OM1- large and normal  RCA: dominant and normal including PDA/ multiple PLA branches  Aortic Valve: Bicuspid with mild calcification. Area by planimetry over 1.5 cm2. By echo  the mean gradient across the valve was 72mmHg and peak gradient 51 mmHg.  Aorta: Severe dilatation of the ascending aortic root particularly at the level of the RPA. Orthogonal measurements are similar to scan done in 2011. 4.5 x 4.4 cm There is no discection, mural hematoma or ulceration. The great vessels arise normally from the arch. Just distal to the left subclavian there is re-coarctation of the aorta. The minimal luminal diameter is 32mm with an area of .9cm2. By echo the mean gradient from the suprasternal notch was 71mmHg and the peak 30 mmHg. By comparison the descending thoracic aorta measures 1.7cm.  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 Normal size and function. 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 Gradient by TTE are mean of 15 mmHg and peak of 30 mmHg.  Seen by Mali Hughes at [facility] 4/15 Will have surgery 2/16 . Discussed having AVR with aneurysm resection, MAZE and ascending to descending aortic  conduit For coractation She is hesitant to have mechanical valve but I strongly encouraged this given her age. Will have cath day before surgery   Seen by Dr Harrington Challenger 11/15 for PAF converted spontaneously Toprol increased not on anticoagulation due to aneurysm and infrequent episodes on propofenone       ROS: Denies fever, malais, weight loss, blurry vision, decreased visual acuity, cough, sputum,  SOB, hemoptysis, pleuritic pain, palpitaitons, heartburn, abdominal pain, melena, lower extremity edema, claudication, or rash.  All other systems reviewed and negative  General: Affect appropriate Healthy:  appears stated age 41: normal Neck supple with no adenopathy JVP normal no bruits no thyromegaly Lungs clear with no wheezing and good diaphragmatic motion previous left lateral thoracotomy with coarctation murmur back  Heart:  S1/S2 AS  murmur, no rub, gallop or click PMI normal Abdomen: benighn, BS positve, no tenderness, no AAA no bruit.  No HSM or HJR Distal pulses intact with no bruits No edema Neuro non-focal Skin warm and dry No muscular weakness   Current Outpatient Prescriptions  Medication Sig Dispense Refill  . Coenzyme Q10 (COQ-10 PO) Take 1 tablet by mouth daily.    Marland Kitchen lisinopril (PRINIVIL,ZESTRIL) 5 MG tablet Take 1 tablet (5 mg total) by mouth daily. 30 tablet 11  . Melatonin 10 MG CAPS Take 1 tablet by mouth daily.    . metoprolol succinate (TOPROL-XL) 25 MG 24 hr tablet Take 1 tablet (25 mg total) by mouth 2 (two) times daily. 60 tablet 11  . propafenone (RYTHMOL) 225 MG tablet Take 1 tablet (225 mg total) by mouth every 8 (eight) hours. 90 tablet 11   No current facility-administered medications for this visit.    Allergies  Penicillins  Electrocardiogram:  5/15  SR low voltage ? Old IMI   Assessment and Plan

## 2014-04-30 ENCOUNTER — Telehealth: Payer: Self-pay | Admitting: Cardiovascular Disease

## 2014-04-30 ENCOUNTER — Encounter: Payer: Self-pay | Admitting: Cardiovascular Disease

## 2014-04-30 ENCOUNTER — Ambulatory Visit (INDEPENDENT_AMBULATORY_CARE_PROVIDER_SITE_OTHER): Payer: BC Managed Care – PPO | Admitting: Cardiovascular Disease

## 2014-04-30 VITALS — BP 124/80 | HR 64 | Ht 64.0 in | Wt 132.0 lb

## 2014-04-30 DIAGNOSIS — Q251 Coarctation of aorta: Secondary | ICD-10-CM

## 2014-04-30 DIAGNOSIS — I480 Paroxysmal atrial fibrillation: Secondary | ICD-10-CM

## 2014-04-30 DIAGNOSIS — I712 Thoracic aortic aneurysm, without rupture, unspecified: Secondary | ICD-10-CM

## 2014-04-30 MED ORDER — AMIODARONE HCL 200 MG PO TABS: 200.0000 mg | ORAL_TABLET | Freq: Two times a day (BID) | ORAL | Status: DC

## 2014-04-30 NOTE — Telephone Encounter (Signed)
Pt notified to stop taking Rhymol and start taking Amiodarone 200 mg twice a day, two weeks before surgery.  Pt aware the Rx has been sent to preferred pharmacy in [place].  Pt does not have any questions at this time but is educated to call office is she has any questions in the future.

## 2014-04-30 NOTE — Addendum Note (Signed)
Addended by: Newt Minion on: 04/30/2014 05:31 PM   Modules accepted: Orders, Medications

## 2014-04-30 NOTE — Assessment & Plan Note (Signed)
Long discussion about anticoagulation She is due for complex heart surgery 2/25 at [facility] with Mali Hughes  Minimal afib on propofenone Will  Avoid blood thinner till then  Per Dr Lovena Le will transition to amiodarone 2 weeks before surgery and 3 months post.  Will discuss dose with him and call in

## 2014-04-30 NOTE — Assessment & Plan Note (Signed)
MLD less than 10 mm  Murmur in back unchanges bypass 2/25 in conjunction with surgery for bicuspid valve and aneurysm

## 2014-04-30 NOTE — Assessment & Plan Note (Signed)
5 cm in setting of bicuspid valve and previous coarctation repair Spoke with [name] on phone and he plans AVR/ root replacement and ascending to descending thoracic aorta bypass.  Discussed operation with patient at length.  She understands procedure and ready for surgery

## 2014-04-30 NOTE — Patient Instructions (Signed)
Your physician wants you to follow-up in: April 2016 with Dr. [name]. You will receive a reminder letter in the mail two months in advance. If you don't receive a letter, please call our office to schedule the follow-up appointment.

## 2014-07-24 ENCOUNTER — Encounter: Payer: Self-pay | Admitting: Cardiovascular Disease

## 2014-08-03 ENCOUNTER — Encounter: Payer: Self-pay | Admitting: Cardiovascular Disease

## 2014-08-03 ENCOUNTER — Telehealth: Payer: Self-pay | Admitting: Cardiovascular Disease

## 2014-08-03 MED ORDER — PROMETHAZINE HCL 25 MG PO TABS: 25.0000 mg | ORAL_TABLET | Freq: Four times a day (QID) | ORAL | Status: DC | PRN

## 2014-08-03 NOTE — Telephone Encounter (Signed)
Pt states that she recently had surgery and they were giving her IV Zofran everyday. Pt states that she vomited this morning. Pt states that Dr. [name] has put pt on Phenergan in the past and that she was fine with either medication. Informed pt that Dr. [name] was not in the office today but that I would route him this information for review and advisement. Pt verbalized understanding and was in agreement with this plan.

## 2014-08-03 NOTE — Telephone Encounter (Signed)
Called pt and made her aware of new order for Phenergan. Pt preferred po Phenergan. Verified pharmacy and informed pt that I would send prescription over to pharmacy. Pt verbalized understanding and was in agreement with this plan.

## 2014-08-03 NOTE — Telephone Encounter (Signed)
New Message  Pt states that she was recently released from [facility]. While she was in the hospital she was given Antinausea medication. Requests a RX to receive an  Antinausea medication because she feels as if she will not be able to know up on top of recently having surgery. Please call

## 2014-08-03 NOTE — Telephone Encounter (Signed)
Can call her in phenergan PO or suppository  thanks

## 2014-08-06 ENCOUNTER — Ambulatory Visit (INDEPENDENT_AMBULATORY_CARE_PROVIDER_SITE_OTHER): Payer: BLUE CROSS/BLUE SHIELD | Admitting: *Deleted

## 2014-08-06 ENCOUNTER — Telehealth: Payer: Self-pay | Admitting: *Deleted

## 2014-08-06 DIAGNOSIS — Q251 Coarctation of aorta: Secondary | ICD-10-CM

## 2014-08-06 DIAGNOSIS — I712 Thoracic aortic aneurysm, without rupture: Secondary | ICD-10-CM | POA: Diagnosis not present

## 2014-08-06 DIAGNOSIS — Z954 Presence of other heart-valve replacement: Secondary | ICD-10-CM

## 2014-08-06 DIAGNOSIS — I4891 Unspecified atrial fibrillation: Secondary | ICD-10-CM | POA: Insufficient documentation

## 2014-08-06 DIAGNOSIS — Z5181 Encounter for therapeutic drug level monitoring: Secondary | ICD-10-CM | POA: Diagnosis not present

## 2014-08-06 DIAGNOSIS — Q231 Congenital insufficiency of aortic valve: Secondary | ICD-10-CM

## 2014-08-06 DIAGNOSIS — I7121 Aneurysm of the ascending aorta, without rupture: Secondary | ICD-10-CM

## 2014-08-06 DIAGNOSIS — Z952 Presence of prosthetic heart valve: Secondary | ICD-10-CM

## 2014-08-06 LAB — PROTIME-INR
INR: 7.5 ratio (ref 0.8–1.0)
Prothrombin Time: 78.2 s (ref 9.6–13.1)

## 2014-08-06 LAB — POCT INR: INR: 7.9

## 2014-08-06 NOTE — Telephone Encounter (Signed)
Received call from [facility] in the lab with critical lab results  PT 78.2  INR 7.5  Patient advised to hold Coumadin today by [facility]  Will forward to them for review

## 2014-08-06 NOTE — Patient Instructions (Signed)

## 2014-08-07 NOTE — Telephone Encounter (Signed)
Patient was seen in the Coumadin Clinic on 08/06/14.  Aware of the INR lab results and called the patient to advise of lab results, please see anticoagulant note on 08/07/14.  Hemphill, Lauralyn Primes, RN

## 2014-08-09 ENCOUNTER — Ambulatory Visit (INDEPENDENT_AMBULATORY_CARE_PROVIDER_SITE_OTHER): Payer: BLUE CROSS/BLUE SHIELD | Admitting: *Deleted

## 2014-08-09 DIAGNOSIS — I480 Paroxysmal atrial fibrillation: Secondary | ICD-10-CM

## 2014-08-09 DIAGNOSIS — I7121 Aneurysm of the ascending aorta, without rupture: Secondary | ICD-10-CM

## 2014-08-09 DIAGNOSIS — Z5181 Encounter for therapeutic drug level monitoring: Secondary | ICD-10-CM

## 2014-08-09 DIAGNOSIS — I712 Thoracic aortic aneurysm, without rupture: Secondary | ICD-10-CM | POA: Diagnosis not present

## 2014-08-09 DIAGNOSIS — Z952 Presence of prosthetic heart valve: Secondary | ICD-10-CM

## 2014-08-09 DIAGNOSIS — I4891 Unspecified atrial fibrillation: Secondary | ICD-10-CM | POA: Diagnosis not present

## 2014-08-09 DIAGNOSIS — Z954 Presence of other heart-valve replacement: Secondary | ICD-10-CM | POA: Diagnosis not present

## 2014-08-09 DIAGNOSIS — Q251 Coarctation of aorta: Secondary | ICD-10-CM

## 2014-08-09 DIAGNOSIS — Q231 Congenital insufficiency of aortic valve: Secondary | ICD-10-CM

## 2014-08-09 LAB — POCT INR: INR: 5.9

## 2014-08-15 ENCOUNTER — Other Ambulatory Visit (HOSPITAL_COMMUNITY)
Admission: RE | Admit: 2014-08-15 | Discharge: 2014-08-15 | Disposition: A | Discharge status: Home / Self Care | Payer: BLUE CROSS/BLUE SHIELD | Source: Ambulatory Visit | Attending: Cardiovascular Disease | Admitting: Cardiovascular Disease

## 2014-08-15 DIAGNOSIS — I5031 Acute diastolic (congestive) heart failure: Secondary | ICD-10-CM | POA: Insufficient documentation

## 2014-08-15 DIAGNOSIS — I4891 Unspecified atrial fibrillation: Secondary | ICD-10-CM | POA: Insufficient documentation

## 2014-08-15 LAB — CBC
HEMATOCRIT: 27.3 % — AB (ref 36.0–46.0)
HEMOGLOBIN: 8.6 g/dL — AB (ref 12.0–15.0)
MCH: 29.2 pg (ref 26.0–34.0)
MCHC: 31.5 g/dL (ref 30.0–36.0)
MCV: 92.5 fL (ref 78.0–100.0)
Platelets: 705 10*3/uL — ABNORMAL HIGH (ref 150–400)
RBC: 2.95 MIL/uL — ABNORMAL LOW (ref 3.87–5.11)
RDW: 15.4 % (ref 11.5–15.5)
WBC: 12.7 10*3/uL — ABNORMAL HIGH (ref 4.0–10.5)

## 2014-08-15 LAB — BASIC METABOLIC PANEL
ANION GAP: 13 (ref 5–15)
BUN: 36 mg/dL — ABNORMAL HIGH (ref 6–23)
CALCIUM: 8.5 mg/dL (ref 8.4–10.5)
CHLORIDE: 88 mmol/L — AB (ref 96–112)
CO2: 25 mmol/L (ref 19–32)
Creatinine, Ser: 1.56 mg/dL — ABNORMAL HIGH (ref 0.50–1.10)
GFR calc Af Amer: 43 mL/min — ABNORMAL LOW (ref >90)
GFR calc non Af Amer: 37 mL/min — ABNORMAL LOW (ref >90)
Glucose, Bld: 135 mg/dL — ABNORMAL HIGH (ref 70–99)
POTASSIUM: 4.6 mmol/L (ref 3.5–5.1)
SODIUM: 126 mmol/L — AB (ref 135–145)

## 2014-08-21 ENCOUNTER — Telehealth: Payer: Self-pay | Admitting: Cardiovascular Disease

## 2014-08-21 NOTE — Telephone Encounter (Signed)
New Message  Made appt w/ Nishan on 4/20.   Pt c/o Shortness Of Breath: STAT if SOB developed within the last 24 hours or pt is noticeably SOB on the phone  1. Are you currently SOB (can you hear that pt is SOB on the phone)? yes  2. How long have you been experiencing SOB? Since surgery 3/23  3. Are you SOB when sitting or when up moving around? All the time, more so when moving  4. Are you currently experiencing any other symptoms? Nausea-

## 2014-08-21 NOTE — Telephone Encounter (Signed)
Pt calling to obtain an appt with Dr [name] sometime this week for ongoing sob.  When listening to the pt speak, noted she is slightly sob, with some difficulty speaking in long, full, sentences.  Pt states that this has been ongoing for a while.  Pt complains of no cp, palpitations, dizziness, pre-syncopal or syncopal issues at this time.  Pt does have some DOE.  Pt states she is retaining fluid, but was recently taken off of her diuretic for dehydration and abnormal kidney function on her last lab results.  Pt states that she spoke with her NP today and was instructed to start back on her diuretic and potassium chloride, and contact her Cardiologist to obtain a follow-up appt for sob.  Scheduled the pt to come in for an appt tomorrow 4/20 at 2 pm.  Advised the pt to take her meds as instructed, and drink water.  Advised the pt to maintain a very low intake of sodium, due to the retention. Advised the pt to elevate her legs to improve her LEE.  Advised the pt to elevated the [facility] with pillows or rest in a recliner to facilitate comfort in breathing.  Advised the pt that if she becomes symptomatically worse with her sob, she should seek out immediate medical attention.  Pt verbalized understanding and agrees with this plan.  Will route this message to Dr [name] and covering nurse for further review.

## 2014-08-22 ENCOUNTER — Ambulatory Visit (HOSPITAL_COMMUNITY): Payer: BLUE CROSS/BLUE SHIELD

## 2014-08-22 ENCOUNTER — Encounter (HOSPITAL_COMMUNITY): Payer: Self-pay | Admitting: Radiology

## 2014-08-22 ENCOUNTER — Inpatient Hospital Stay (HOSPITAL_COMMUNITY): Payer: BLUE CROSS/BLUE SHIELD

## 2014-08-22 ENCOUNTER — Ambulatory Visit (INDEPENDENT_AMBULATORY_CARE_PROVIDER_SITE_OTHER): Payer: BLUE CROSS/BLUE SHIELD | Admitting: Cardiovascular Disease

## 2014-08-22 ENCOUNTER — Inpatient Hospital Stay (HOSPITAL_COMMUNITY)
Admission: AD | Admit: 2014-08-22 | Discharge: 2014-08-24 | DRG: 291 | Disposition: A | Discharge status: Other Acute Inpatient Hospital | Payer: BLUE CROSS/BLUE SHIELD | Source: Ambulatory Visit | Attending: Cardiovascular Disease | Admitting: Cardiovascular Disease

## 2014-08-22 VITALS — HR 89 | Ht 64.0 in | Wt 128.4 lb

## 2014-08-22 DIAGNOSIS — Z7982 Long term (current) use of aspirin: Secondary | ICD-10-CM

## 2014-08-22 DIAGNOSIS — Z7901 Long term (current) use of anticoagulants: Secondary | ICD-10-CM

## 2014-08-22 DIAGNOSIS — R0600 Dyspnea, unspecified: Secondary | ICD-10-CM

## 2014-08-22 DIAGNOSIS — Z952 Presence of prosthetic heart valve: Secondary | ICD-10-CM | POA: Diagnosis not present

## 2014-08-22 DIAGNOSIS — R0602 Shortness of breath: Secondary | ICD-10-CM

## 2014-08-22 DIAGNOSIS — Z79899 Other long term (current) drug therapy: Secondary | ICD-10-CM | POA: Diagnosis not present

## 2014-08-22 DIAGNOSIS — I5023 Acute on chronic systolic (congestive) heart failure: Secondary | ICD-10-CM

## 2014-08-22 DIAGNOSIS — Q231 Congenital insufficiency of aortic valve: Secondary | ICD-10-CM | POA: Diagnosis not present

## 2014-08-22 DIAGNOSIS — I712 Thoracic aortic aneurysm, without rupture, unspecified: Secondary | ICD-10-CM | POA: Diagnosis present

## 2014-08-22 DIAGNOSIS — I5031 Acute diastolic (congestive) heart failure: Principal | ICD-10-CM | POA: Diagnosis present

## 2014-08-22 DIAGNOSIS — Q251 Coarctation of aorta: Secondary | ICD-10-CM

## 2014-08-22 DIAGNOSIS — D649 Anemia, unspecified: Secondary | ICD-10-CM | POA: Diagnosis present

## 2014-08-22 DIAGNOSIS — J90 Pleural effusion, not elsewhere classified: Secondary | ICD-10-CM

## 2014-08-22 DIAGNOSIS — R0689 Other abnormalities of breathing: Secondary | ICD-10-CM

## 2014-08-22 DIAGNOSIS — I4891 Unspecified atrial fibrillation: Secondary | ICD-10-CM | POA: Diagnosis not present

## 2014-08-22 DIAGNOSIS — J9600 Acute respiratory failure, unspecified whether with hypoxia or hypercapnia: Secondary | ICD-10-CM | POA: Diagnosis present

## 2014-08-22 DIAGNOSIS — I480 Paroxysmal atrial fibrillation: Secondary | ICD-10-CM | POA: Diagnosis present

## 2014-08-22 DIAGNOSIS — I509 Heart failure, unspecified: Secondary | ICD-10-CM

## 2014-08-22 DIAGNOSIS — I4892 Unspecified atrial flutter: Secondary | ICD-10-CM | POA: Diagnosis not present

## 2014-08-22 DIAGNOSIS — Z9889 Other specified postprocedural states: Secondary | ICD-10-CM

## 2014-08-22 LAB — COMPREHENSIVE METABOLIC PANEL
ALT: 30 U/L (ref 0–35)
AST: 34 U/L (ref 0–37)
Albumin: 3.1 g/dL — ABNORMAL LOW (ref 3.5–5.2)
Alkaline Phosphatase: 100 U/L (ref 39–117)
Anion gap: 12 (ref 5–15)
BUN: 8 mg/dL (ref 6–23)
CO2: 32 mmol/L (ref 19–32)
Calcium: 8.6 mg/dL (ref 8.4–10.5)
Chloride: 92 mmol/L — ABNORMAL LOW (ref 96–112)
Creatinine, Ser: 1.05 mg/dL (ref 0.50–1.10)
GFR calc Af Amer: 70 mL/min — ABNORMAL LOW (ref >90)
GFR calc non Af Amer: 60 mL/min — ABNORMAL LOW (ref >90)
Glucose, Bld: 93 mg/dL (ref 70–99)
Potassium: 3 mmol/L — ABNORMAL LOW (ref 3.5–5.1)
Sodium: 136 mmol/L (ref 135–145)
Total Bilirubin: 1.8 mg/dL — ABNORMAL HIGH (ref 0.3–1.2)
Total Protein: 6.9 g/dL (ref 6.0–8.3)

## 2014-08-22 LAB — CBC
HCT: 35.6 % — ABNORMAL LOW (ref 36.0–46.0)
Hemoglobin: 10.5 g/dL — ABNORMAL LOW (ref 12.0–15.0)
MCH: 26.6 pg (ref 26.0–34.0)
MCHC: 29.5 g/dL — ABNORMAL LOW (ref 30.0–36.0)
MCV: 90.1 fL (ref 78.0–100.0)
Platelets: 577 10*3/uL — ABNORMAL HIGH (ref 150–400)
RBC: 3.95 MIL/uL (ref 3.87–5.11)
RDW: 15.4 % (ref 11.5–15.5)
WBC: 13.7 10*3/uL — ABNORMAL HIGH (ref 4.0–10.5)

## 2014-08-22 LAB — D-DIMER, QUANTITATIVE: D-Dimer, Quant: 7.68 ug/mL-FEU — ABNORMAL HIGH (ref 0.00–0.48)

## 2014-08-22 LAB — PROTIME-INR
INR: 1.3 (ref 0.00–1.49)
Prothrombin Time: 16.3 seconds — ABNORMAL HIGH (ref 11.6–15.2)

## 2014-08-22 LAB — MRSA PCR SCREENING: MRSA by PCR: NEGATIVE

## 2014-08-22 LAB — TSH: TSH: 3.211 u[IU]/mL (ref 0.350–4.500)

## 2014-08-22 MED ORDER — METOPROLOL SUCCINATE ER 25 MG PO TB24
25.0000 mg | ORAL_TABLET | Freq: Two times a day (BID) | ORAL | Status: DC
  Administered 2014-08-22 – 2014-08-23 (×2): 25 mg via ORAL
  Filled 2014-08-22 (×3): qty 1

## 2014-08-22 MED ORDER — POTASSIUM CHLORIDE CRYS ER 10 MEQ PO TBCR
EXTENDED_RELEASE_TABLET | ORAL | Status: AC
  Filled 2014-08-22: qty 2

## 2014-08-22 MED ORDER — POTASSIUM CHLORIDE CRYS ER 20 MEQ PO TBCR
20.0000 meq | EXTENDED_RELEASE_TABLET | Freq: Two times a day (BID) | ORAL | Status: DC
  Administered 2014-08-22: 20 meq via ORAL

## 2014-08-22 MED ORDER — WARFARIN - PHARMACIST DOSING INPATIENT: Freq: Every day | Status: DC

## 2014-08-22 MED ORDER — OXYCODONE HCL 5 MG PO TABS
5.0000 mg | ORAL_TABLET | ORAL | Status: DC | PRN
  Administered 2014-08-23 (×2): 5 mg via ORAL
  Filled 2014-08-22 (×2): qty 1

## 2014-08-22 MED ORDER — ZOLPIDEM TARTRATE 5 MG PO TABS
5.0000 mg | ORAL_TABLET | Freq: Every evening | ORAL | Status: DC | PRN
  Administered 2014-08-23: 5 mg via ORAL
  Filled 2014-08-22: qty 1

## 2014-08-22 MED ORDER — POTASSIUM CHLORIDE CRYS ER 20 MEQ PO TBCR
40.0000 meq | EXTENDED_RELEASE_TABLET | ORAL | Status: AC
  Administered 2014-08-22 – 2014-08-23 (×4): 40 meq via ORAL
  Filled 2014-08-22 (×4): qty 2

## 2014-08-22 MED ORDER — ONDANSETRON HCL 4 MG PO TABS: 4.0000 mg | ORAL_TABLET | Freq: Three times a day (TID) | ORAL | Status: DC | PRN

## 2014-08-22 MED ORDER — ALPRAZOLAM 0.25 MG PO TABS
0.2500 mg | ORAL_TABLET | Freq: Two times a day (BID) | ORAL | Status: DC | PRN
  Administered 2014-08-22 – 2014-08-24 (×4): 0.25 mg via ORAL
  Filled 2014-08-22 (×4): qty 1

## 2014-08-22 MED ORDER — ONDANSETRON HCL 4 MG/2ML IJ SOLN: 4.0000 mg | Freq: Four times a day (QID) | INTRAMUSCULAR | Status: DC | PRN

## 2014-08-22 MED ORDER — DILTIAZEM LOAD VIA INFUSION
10.0000 mg | Freq: Once | INTRAVENOUS | Status: AC
  Administered 2014-08-22: 10 mg via INTRAVENOUS
  Filled 2014-08-22: qty 10

## 2014-08-22 MED ORDER — IOHEXOL 350 MG/ML SOLN
80.0000 mL | Freq: Once | INTRAVENOUS | Status: AC | PRN
  Administered 2014-08-22: 80 mL via INTRAVENOUS

## 2014-08-22 MED ORDER — PROPAFENONE HCL 225 MG PO TABS: 225.0000 mg | ORAL_TABLET | Freq: Three times a day (TID) | ORAL | Status: DC

## 2014-08-22 MED ORDER — FUROSEMIDE 10 MG/ML IJ SOLN: 40.0000 mg | Freq: Two times a day (BID) | INTRAMUSCULAR | Status: DC

## 2014-08-22 MED ORDER — DILTIAZEM HCL 100 MG IV SOLR: 5.0000 mg/h | INTRAVENOUS | Status: DC

## 2014-08-22 MED ORDER — DILTIAZEM HCL 100 MG IV SOLR
5.0000 mg/h | INTRAVENOUS | Status: DC
  Administered 2014-08-22: 5 mg/h via INTRAVENOUS
  Administered 2014-08-22: 15 mg/h via INTRAVENOUS
  Administered 2014-08-22 – 2014-08-23 (×2): 10 mg/h via INTRAVENOUS
  Filled 2014-08-22 (×3): qty 100

## 2014-08-22 MED ORDER — ACETAMINOPHEN 325 MG PO TABS: 650.0000 mg | ORAL_TABLET | ORAL | Status: DC | PRN

## 2014-08-22 MED ORDER — SODIUM CHLORIDE 0.9 % IJ SOLN: 10.0000 mL | INTRAMUSCULAR | Status: DC | PRN

## 2014-08-22 MED ORDER — HEPARIN BOLUS VIA INFUSION
4000.0000 [IU] | Freq: Once | INTRAVENOUS | Status: AC
  Administered 2014-08-22: 4000 [IU] via INTRAVENOUS
  Filled 2014-08-22: qty 4000

## 2014-08-22 MED ORDER — METOPROLOL TARTRATE 1 MG/ML IV SOLN
INTRAVENOUS | Status: AC
  Filled 2014-08-22: qty 5

## 2014-08-22 MED ORDER — HEPARIN (PORCINE) IN NACL 100-0.45 UNIT/ML-% IJ SOLN
1000.0000 [IU]/h | INTRAMUSCULAR | Status: DC
  Administered 2014-08-22: 1000 [IU]/h via INTRAVENOUS
  Filled 2014-08-22 (×2): qty 250

## 2014-08-22 MED ORDER — PERFLUTREN LIPID MICROSPHERE
INTRAVENOUS | Status: AC
  Filled 2014-08-22: qty 10

## 2014-08-22 MED ORDER — FUROSEMIDE 10 MG/ML IJ SOLN
40.0000 mg | Freq: Once | INTRAMUSCULAR | Status: AC
  Administered 2014-08-22: 40 mg via INTRAVENOUS
  Filled 2014-08-22: qty 4

## 2014-08-22 MED ORDER — METOPROLOL TARTRATE 1 MG/ML IV SOLN
2.5000 mg | Freq: Once | INTRAVENOUS | Status: AC
  Administered 2014-08-22: 2.5 mg via INTRAVENOUS

## 2014-08-22 MED ORDER — ASPIRIN 81 MG PO CHEW
81.0000 mg | CHEWABLE_TABLET | Freq: Every day | ORAL | Status: DC
  Administered 2014-08-23 – 2014-08-24 (×2): 81 mg via ORAL
  Filled 2014-08-22 (×2): qty 1

## 2014-08-22 MED ORDER — SODIUM CHLORIDE 0.9 % IJ SOLN
10.0000 mL | Freq: Two times a day (BID) | INTRAMUSCULAR | Status: DC
  Administered 2014-08-22: 40 mL
  Administered 2014-08-23: 10 mL
  Administered 2014-08-23 – 2014-08-24 (×2): 30 mL

## 2014-08-22 NOTE — Progress Notes (Signed)
Echocardiogram 2D Echocardiogram has been performed.  Nakiea Metzner 08/22/2014, 3:05 PM

## 2014-08-22 NOTE — Progress Notes (Signed)
Patient ID: Mallory Sutton, female   DOB: March 06, 1961, 54 y.o.   MRN: [id number] 54 y.o.  complex cardiac issues. Followed since 99. Had coarctation repair at age 35 with left lateral thoracotomy. W/U in past has been limited by lack of insurance. She has insurance now . She has a known bicuspid AV with ascending aortic aneurysm and re-coarctation. She has PAF    Her echo in August showed progression of gradient with Mean 41 mmHg now severe AS. She denies dyspnea or chest pain  Daughter is living at home and studying psychology at [employer]. Son is in 10 th grade at [employer]. Husband still trucking with lots of routes to [place]   Echo August 2014 with progression of AS  Study Conclusions  - Left ventricle: The cavity size was normal. Wall thickness was increased in a pattern of mild LVH. Systolic function was normal. The estimated ejection fraction was in the range of 60% to 65%. - Aortic valve: AV is difficult to see well It is thickened, calcified with restricted motion. Peak and mean gradients through the valve are 61 and41 mm Hg respectively consistent with moderate to severe AS. Mean gradient is increased from echoin 2013 - Aorta: Descending aorta at area of coarc with peak gradient of 34 mm Hg (2.9 cm/sec)  Cardiac CT: 09/16/11  Indication: Morphology Bicuspid AV, Ascending aortic Aneurysm and previous Coarctation Repair.  Comparison Study: 02/26/10 CT  Findings:  Calcium Score: not done but no calcium seen in arteries on axial Images   Coronary Arteries:  LM-normal LAD-normal D1- normal D2-normal  Circumflex- nondominant and normal  OM1- large and normal  RCA: dominant and normal including PDA/ multiple PLA branches  Aortic Valve: Bicuspid with mild calcification. Area by planimetry over 1.5 cm2. By echo the mean gradient across the valve was 58mmHg and peak gradient 51 mmHg.  Aorta: Severe dilatation of the ascending aortic root particularly at the level of the  RPA. Orthogonal measurements are similar to scan done in 2011. 4.5 x 4.4 cm There is no discection, mural hematoma or ulceration. The great vessels arise normally from the arch. Just distal to the left subclavian there is re-coarctation of the aorta. The minimal luminal diameter is 53mm with an area of .9cm2. By echo the mean gradient from the suprasternal notch was 64mmHg and the peak 30 mmHg. By comparison the descending thoracic aorta measures 1.7cm.  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 Normal size and function. 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 Gradient by TTE are mean of 15 mmHg and peak of 30 mmHg.  Had Wheat procedure at [facility] by [name] 07/25/14 involved mechanical AVR with root replacement. No mention of fixing coarcatation  Also had MAZE and LAA exclusion Dyspnic.  Had diuretic stopped due to azotemia. Lots of nausea.  Seen as add on today for marked dyspnea.  She called [name] nurse and took  a lasix yesterday.  She cannot lay flat.  Nausea some better off amiodarone which Dr Lovena Le stopped on 08/13/14.  INR;s have been high due to interaction and only taking qod.  No bleeding issues.    In office in obvious congestive failure.  Seems to have large bilateral effusions.  Needs to be admitted for CHF and possible thoracentesis further monitoring of fluid, HCT and INR Will arrange bed in CCU     4/13 BUN 36 Cr 1.6  Na 126 K 4.6   Hct 27.3    ROS: Denies fever, malais, weight loss, blurry vision, decreased visual acuity, cough, sputum, SOB, hemoptysis, pleuritic pain, palpitaitons, heartburn, abdominal pain, melena, lower extremity edema, claudication, or rash.  All other systems reviewed and negative  General: Anxious Pale white female  HEENT: normal Neck supple with no adenopathy JVP elevated no bruits no thyromegaly Lungs dull mid level to base bilateral with rales  good diaphragmatic motion previous left lateral thoracotomy with coarctation murmur back  Heart:  Z6/X0 click mechanical AVR  murmur, no rub, gallop or click Sternum healing well previous left lateral thoracotomy PMI normal Abdomen: benighn, BS positve, no tenderness, no AAA no bruit.  No HSM or HJR Distal pulses intact with no bruits Plus one bilateral  edema Neuro non-focal Skin warm and dry No muscular weakness   Current Outpatient Prescriptions  Medication Sig Dispense Refill  . amiodarone (PACERONE) 200 MG tablet Take 1 tablet (200 mg total) by mouth 2 (two) times daily. 60 tablet 1  . Coenzyme Q10 (COQ-10 PO) Take 1 tablet by mouth daily.    Marland Kitchen lisinopril (PRINIVIL,ZESTRIL) 5 MG tablet Take 1 tablet (5 mg total) by mouth daily. 30 tablet 11  . Melatonin 10 MG CAPS Take 1 tablet by mouth daily.    . metoprolol succinate (TOPROL-XL) 25 MG 24 hr tablet Take 1 tablet (25 mg total) by mouth 2 (two) times daily. 60 tablet 11  . promethazine (PHENERGAN) 25 MG tablet Take 1 tablet (25 mg total) by mouth every 6 (six) hours as needed for nausea or vomiting. 30 tablet 0   No current facility-administered medications for this visit.    Allergies  Penicillins  Electrocardiogram:  5/15  SR low voltage ? Old IMI   08/22/14  SR rate 89  Low voltage  Nonspecific ST changes  Assessment and Plan CHF:  Had small pleural effusion on d/c from [facility] diuretics stopped  Stat CXR on arrival may need thoracentesis.  Do  CXR with lateral decubitus  Start lasix 40 iv bid Check echo for post op EF , AVR function and r/o pericardial effusion given CE on CXR and anticoagulation for valve  Congenital heart disease:  AVR, Root Replacement , MAZE  Will call [name] not clear why coarctation was not addressed unless this was part of arch repair Check suprasternal notch CW doppler on echo.  Still with persistant left sided SVC and previous sinus venosis ASD repair. Not clear if SVC in tact  PAF:  Amiodarone stopped nausea better.  Abdominal exam ok.  Ask EP when propofenone can be resumed as she was on this prior to surgery.  Post MAZE and LAA Ligation  On arrival needs:  Labs  CBC PLT BMET INR D dimer  CXR with lateral decubitus Echo Stat Lasix 40 mg iv Oxygen  Jenkins Rouge

## 2014-08-22 NOTE — Progress Notes (Signed)
Reviewed echo results with Dr Acie Fredrickson. She appears dry on her echo. She did get some relief after IV Lasix- 1L diuresis. Her B/P did get soft with IV Diltiazem. I spoke with Dr Radford Pax who has evaluated the pt. Will see if we can get a PICC placed (CCM unable to place [place]) and use CVP reading from this. HR still fast, will try Lopressor 2.5 mg IV x 1. Stat PCXR ordered. I spoke with Dr Caryl Comes- he suggested we resume Rythmol as taken previously and set her up for a TEE in AM.   Kerin Ransom PA-C 08/22/2014 4:40 PM

## 2014-08-22 NOTE — Progress Notes (Signed)
ANTICOAGULATION CONSULT NOTE - Initial Consult  Pharmacy Consult for Heparin Indication: Mechanical aortic valve, suspect PE  Allergies  Allergen Reactions  . Amiodarone Nausea Only  . Penicillins     Patient Measurements:   Heparin Dosing Weight: 58.2 kg  Vital Signs: BP: 123/107 mmHg (04/20 1400) Pulse Rate: 133 (04/20 1400)  Labs:  Recent Labs  08/22/14 1515  HGB 10.5*  HCT 35.6*  PLT 577*  LABPROT 16.3*  INR 1.30  CREATININE 1.05    Estimated Creatinine Clearance: 54.1 mL/min (by C-G formula based on Cr of 1.05).   Medical History: Past Medical History  Diagnosis Date  . Bicuspid aortic valve   . Aortic stenosis   . Paroxysmal atrial fibrillation   . ATRIAL ARRHYTHMIAS   . THORACIC AORTIC ANEURYSM   . COARCTATION OF AORTA   . ENDOMETRIOSIS   . Shortness of breath     Medications:  Prescriptions prior to admission  Medication Sig Dispense Refill Last Dose  . aspirin 81 MG chewable tablet Chew 81 mg by mouth daily.   Taking  . Coenzyme Q10 (COQ-10 PO) Take 1 tablet by mouth daily.   Taking  . furosemide (LASIX) 40 MG tablet Take 40 mg by mouth 2 (two) times daily.  1 Taking  . lisinopril (PRINIVIL,ZESTRIL) 5 MG tablet Take 5 mg by mouth daily.  11   . metoprolol succinate (TOPROL-XL) 25 MG 24 hr tablet Take 1 tablet (25 mg total) by mouth 2 (two) times daily. 60 tablet 11 Taking  . Multiple Vitamin (MULTI-VITAMINS) TABS Take by mouth daily.   Taking  . ondansetron (ZOFRAN) 4 MG tablet Take 4 mg by mouth every 8 (eight) hours as needed. FOR NAUSEA   Taking  . oxyCODONE (OXY IR/ROXICODONE) 5 MG immediate release tablet Take 5 mg by mouth every 4 (four) hours as needed. 1-2 TABLETS EVERY 4 HOURS AS NEEDED FOR PAIN   Taking  . potassium chloride SA (K-DUR,KLOR-CON) 20 MEQ tablet Take 20 mEq by mouth 2 (two) times daily.  1 Taking  . promethazine (PHENERGAN) 25 MG tablet Take 25 mg by mouth.  0   . propafenone (RYTHMOL) 225 MG tablet Take 225 mg by mouth  every 8 (eight) hours.  1   . warfarin (COUMADIN) 1 MG tablet Take 1 mg by mouth every other day. 1/2 TABLET EVERY OTHER NIGHT   Taking    Assessment: 54 y.o. complex cardiac issues. Followed since 99. Had coarctation repair at age 38 with left lateral thoracotomy. Wheat procedure at [facility] 07/25/14 involving mechanical AVR, root replacement, maze procedure and LAA exclusion.  Anticoagulation: Coumadin PTA for mechanical AVR. Afib. Ddimer now 7.6. INR only 1.3. Start IV heparin for suspicion of PE and afib and AVR with subtherapeutic INR. No thoracentesis planned currently.  Goal of Therapy:  Heparin level 0.3-0.7 units/ml Monitor platelets by anticoagulation protocol: Yes   Plan:  Heparin 4000 unit IV bolus Heparin infusion 1000 units/hr Check heparin level in 6-8 hrs. Daily HL and CBC.   Kunaal Walkins S. Alford Highland, PharmD, BCPS Clinical Staff Pharmacist Pager [phone]  Eilene Ghazi Stillinger 08/22/2014,5:31 PM

## 2014-08-22 NOTE — Telephone Encounter (Signed)
NOTED ./CY 

## 2014-08-22 NOTE — Progress Notes (Signed)
Patient ID: Mallory Sutton, female   DOB: Aug 15, 1960, 54 y.o.   MRN: [id number] Reviewed data since admitted from office INR subRx had been high earlier on amiodarone and only taking coumadin qod She was in NSR in office so afib occurred today CXR with large bilateral effusion left greater than right.    Would do thoracentesis on left before cardioversion and coumadin ok to start heparin Have written order for IR thoracentesis can ask radiology if they want heparin held   Agree with restarting propofenone  Ddimer was over 7 but CT negative for PE Echo with good RV/LV EF and only small effusion JVP was very high in office with obvious pleural effusions  Dyspnea primary issue now that nausea better off amiodarone With supraRx INR a week ago , LAA exclusion doubt thrombus in LA  Can consider TEE/DCC if remains in afib after effusion tapped  Still not sure why coarctation not addressed with surgery that is why we sent her to [place]  For combined procedure with ascending to descending aortic bypass.  Will try to call [name] at [facility]

## 2014-08-22 NOTE — Progress Notes (Signed)
ANTICOAGULATION CONSULT NOTE - Initial Consult  Pharmacy Consult for warfarin  Indication: mechanical aortic valve  Allergies  Allergen Reactions  . Amiodarone Nausea Only  . Penicillins     Patient Measurements:    Vital Signs: BP: 123/107 mmHg (04/20 1400) Pulse Rate: 133 (04/20 1400)  Labs: No results for input(s): HGB, HCT, PLT, APTT, LABPROT, INR, HEPARINUNFRC, CREATININE, CKTOTAL, CKMB, TROPONINI in the last 72 hours.  Estimated Creatinine Clearance: 36.4 mL/min (by C-G formula based on Cr of 1.56).   Medical History: Past Medical History  Diagnosis Date  . Bicuspid aortic valve   . Aortic stenosis   . Paroxysmal atrial fibrillation   . ATRIAL ARRHYTHMIAS   . THORACIC AORTIC ANEURYSM   . COARCTATION OF AORTA   . ENDOMETRIOSIS   . Shortness of breath    Assessment: 54 y.o. complex cardiac issues. Followed since 99. Had coarctation repair at age 70 with left lateral thoracotomy. Wheat procedure at [facility] 07/25/14 involving mechanical AVR, root replacement, maze procedure and LAA exclusion.  Patient now with nausea and marked dyspnea unable to lay flat. History of afib now in afib with rvr to be started on diltiazem drip.  CXR ordered to rule bilateral effusions and need for possible thoracentesis. Pharmacy consulted to dose warfarin once patient cleared from thoracentesis perspective. Instructed Kilroy PA to reconsult when cleared. Also awaiting INR.  PTA Warfarin still titration to stable dose, Last outpatient INR 4/11 was 3.5 per patient and dose was decreased to 0.5 mg qod, last dose was PM of 4/18  Goal of Therapy:  INR 2-3 (per [facility] records) Monitor platelets by anticoagulation protocol: Yes   Plan:  1. Follow up INR, and CXR dose warfarin today based on this plan 2. To hold warfarin today if thoracentesis indicated. 3. Daily INR  Thank you for allowing pharmacy to be a part of this patients care team.  Rowe Robert Pharm.D., BCPS,  AQ-Cardiology Clinical Pharmacist 08/22/2014 3:01 PM Pager: [phone] Phone: [phone]

## 2014-08-22 NOTE — H&P (Signed)
H&P 08/22/14  Josue Hector, MD Physician Signed  Progress Notes 08/22/2014 8:50 AM  Related encounter: Office Visit from 08/22/2014 in [facility]    Expand All Collapse All   Patient ID: Shakerra Red, female DOB: 05-03-1961, 54 y.o. MRN: [id number]  54 y.o. complex cardiac issues. Followed since 99. Had coarctation repair at age 29 with left lateral thoracotomy. W/U in past has been limited by lack of insurance. She has insurance now . She has a known bicuspid AV with ascending aortic aneurysm and re-coarctation. She has PAF Her echo in August showed progression of gradient with Mean 41 mmHg now severe AS. She denies dyspnea or chest pain  Daughter is living at home and studying psychology at [employer]. Son is in 10 th grade at [employer]. Husband still trucking with lots of routes to [place]  Had Wheat procedure at [facility] by [name] 07/25/14 involved mechanical AVR with root replacement. No mention of fixing coarctation. Also had MAZE and LAA exclusion. Had diuretic stopped due to azotemia. She has had ots of nausea. Seen as add on in the office 08/22/14 for marked dyspnea. She called [name] nurse and took a lasix yesterday. She cannot lay flat. Nausea some better off amiodarone which Dr Lovena Le stopped on 08/13/14. INR;s have been high due to interaction and only taking qod. No bleeding issues.She is in rapid AF as well.  Echo August 2014 with progression of AS  Study Conclusions  - Left ventricle: The cavity size was normal. Wall thickness was increased in a pattern of mild LVH. Systolic function was normal. The estimated ejection fraction was in the range of 60% to 65%. - Aortic valve: AV is difficult to see well It is thickened, calcified with restricted motion. Peak and mean gradients through the valve are 61 and41 mm Hg respectively consistent with moderate to severe AS. Mean gradient is increased from echoin 2013 - Aorta: Descending aorta at area of  coarc with peak gradient of 34 mm Hg (2.9 cm/sec)  Cardiac CT: 09/16/11  Indication: Morphology Bicuspid AV, Ascending aortic Aneurysm and previous Coarctation Repair.  Comparison Study: 02/26/10 CT  Findings:  Calcium Score: not done but no calcium seen in arteries on axial Images   Coronary Arteries:  LM-normal LAD-normal D1- normal D2-normal  Circumflex- nondominant and normal  OM1- large and normal  RCA: dominant and normal including PDA/ multiple PLA branches  Aortic Valve: Bicuspid with mild calcification. Area by planimetry over 1.5 cm2. By echo the mean gradient across the valve was 72mmHg and peak gradient 51 mmHg.  Aorta: Severe dilatation of the ascending aortic root particularly at the level of the RPA. Orthogonal measurements are similar to scan done in 2011. 4.5 x 4.4 cm There is no discection, mural hematoma or ulceration. The great vessels arise normally from the arch. Just distal to the left subclavian there is re-coarctation of the aorta. The minimal luminal diameter is 24mm with an area of .9cm2. By echo the mean gradient from the suprasternal notch was 10mmHg and the peak 30 mmHg. By comparison the descending thoracic aorta measures 1.7cm.  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 Normal size and function. 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 Gradient by TTE are mean  of 15 mmHg and peak of 30 mmHg.       4/13 BUN 36 Cr 1.6 Na 126 K 4.6 Hct 27.3    ROS: Denies fever, malais, weight loss, blurry vision, decreased visual acuity, cough, sputum, SOB, hemoptysis, pleuritic pain, palpitaitons, heartburn, abdominal pain, melena, lower extremity edema, claudication, or rash. All other systems reviewed and negative  General: Anxious Pale white female  HEENT: normal Neck supple with no adenopathy JVP elevated no bruits no thyromegaly Lungs dull mid level to base bilateral with rales good diaphragmatic motion previous left lateral thoracotomy with coarctation murmur back  Heart: X5/M8 click mechanical AVR murmur, no rub, gallop or click Sternum healing well previous left lateral thoracotomy PMI normal Abdomen: benighn, BS positve, no tenderness, no AAA no bruit. No HSM or HJR Distal pulses intact with no bruits Plus one bilateral edema Neuro non-focal Skin warm and dry No muscular weakness   Current Outpatient Prescriptions  Medication Sig Dispense Refill  . amiodarone (PACERONE) 200 MG tablet Take 1 tablet (200 mg total) by mouth 2 (two) times daily. 60 tablet 1  . Coenzyme Q10 (COQ-10 PO) Take 1 tablet by mouth daily.    Marland Kitchen lisinopril (PRINIVIL,ZESTRIL) 5 MG tablet Take 1 tablet (5 mg total) by mouth daily. 30 tablet 11  . Melatonin 10 MG CAPS Take 1 tablet by mouth daily.    . metoprolol succinate (TOPROL-XL) 25 MG 24 hr tablet Take 1 tablet (25 mg total) by mouth 2 (two) times daily. 60 tablet 11  . promethazine (PHENERGAN) 25 MG tablet Take 1 tablet (25 mg total) by mouth every 6 (six) hours as needed for nausea or vomiting. 30 tablet 0   No current facility-administered medications for this visit.    Allergies  Penicillins  Electrocardiogram: 5/15 SR low voltage ? Old IMI 08/22/14 SR rate 89 Low voltage Nonspecific ST changes  Assessment and Plan In office today in obvious  congestive failure. Seems to have large bilateral effusions. Needs to be admitted for CHF and possible thoracentesis further monitoring of fluid, HCT and INR stat Will arrange bed in CCU CHF: Had small pleural effusion on d/c from [facility] diuretics stopped Stat CXR on arrival may need thoracentesis. Do CXR with lateral decubitus Start lasix 40 iv bid Check echo for post op EF , AVR function and r/o pericardial effusion given CE on CXR and anticoagulation for valve  Congenital heart disease: AVR, Root Replacement , MAZE Will call [name] not clear why coarctation was not addressed unless this was part of arch repair Check suprasternal notch CW doppler on echo. Still with persistant left sided SVC and previous sinus venosis ASD repair. Not clear if SVC in tact  PAF: Amiodarone stopped nausea better. Abdominal exam ok. Ask EP when propofenone can be resumed as she was on this prior to surgery. Post MAZE and LAA Ligation  On arrival needs:  Labs CBC PLT BMET INR D dimer  CXR with lateral decubitus Echo Stat Lasix 40 mg iv Oxygen  Jenkins Rouge

## 2014-08-22 NOTE — Progress Notes (Signed)
Peripherally Inserted Central Catheter/Midline Placement  The IV Nurse has discussed with the patient and/or persons authorized to consent for the patient, the purpose of this procedure and the potential benefits and risks involved with this procedure.  The benefits include less needle sticks, lab draws from the catheter and patient may be discharged home with the catheter.  Risks include, but not limited to, infection, bleeding, blood clot (thrombus formation), and puncture of an artery; nerve damage and irregular heat beat.  Alternatives to this procedure were also discussed.  PICC/Midline Placement Documentation  PICC Triple Lumen 67/12/45 PICC Right Basilic 39 cm 1 cm (Active)  Exposed Catheter (cm) 1 cm 08/22/2014  7:00 PM  Dressing Change Due 08/29/14 08/22/2014  7:00 PM       Amaka Gluth, Maricela Bo 08/22/2014, 7:01 PM

## 2014-08-22 NOTE — Consult Note (Signed)
ELECTROPHYSIOLOGY CONSULT NOTE  Patient ID: Mallory Sutton, MRN: [id number], DOB/AGE: 11/27/1960 54 y.o. Admit date: 08/22/2014 Date of Consult: 08/22/2014  Primary Physician: Glo Herring., MD Primary Cardiologist: pn  Chief Complaint: ATRIAL FIBRILLATION   HPI Mallory Sutton is a 54 y.o. female  Admitted with atrial fibrillation and a rapid rate. She has a known antecedent history of atrial fibrillation treated previously with propafenone. She was transitioned to amiodarone in anticipation of her heart surgery F (see below) which was accomplished early March 2016. She was unable to tolerate the amiodarone because of nausea and discontinued it about a week ago.  She is admitted to hospital following presentation in the office with congestive heart failure. She is currently aware of her tachypalpitations; however, she is not sure when this started. Her dyspnea has been a problem over the last week.  She had she has a remote history of coarctation repair at age 23 with left lateral thoracotomy.   . She has a known bicuspid AV with ascending aortic aneurysm and re-coarctation. She has PAF Her echo in August showed progression of gradient with Mean 41 mmHg TO severe AS.  complex cardiac issues. Followed since 99. Had coarctation repair at age 3 with left lateral thoracotomy. W/U in past has been limited by lack of insurance. She has insurance now . She has a known bicuspid AV with ascending aortic aneurysm and re-coarctation. She has PAF Her echo in August showed progression of gradient with Mean 41 mmHg now severe AS. She denies dyspnea or chest pain  Daughter is living at home and studying psychology at [employer]. Son is in 10 th grade at [employer]. Husband still trucking with lots of routes to [place]   Echo August 2014 with progression of AS  Study Conclusions  - Left ventricle: The cavity size was normal. Wall thickness was increased in a pattern of mild LVH. Systolic function was  normal. The estimated ejection fraction was in the range of 60% to 65%. - Aortic valve: AV is difficult to see well It is thickened, calcified with restricted motion. Peak and mean gradients through the valve are 61 and41 mm Hg respectively consistent with moderate to severe AS. Mean gradient is increased from echoin 2013 - Aorta: Descending aorta at area of coarc with peak gradient of 34 mm Hg (2.9 cm/sec)  Cardiac CT: 09/16/11  Indication: Morphology Bicuspid AV, Ascending aortic Aneurysm and previous Coarctation Repair.  Comparison Study: 02/26/10 CT  Findings:  Calcium Score: not done but no calcium seen in arteries on axial Images   Coronary Arteries:  LM-normal LAD-normal D1- normal D2-normal  Circumflex- nondominant and normal  OM1- large and normal  RCA: dominant and normal including PDA/ multiple PLA branches  Aortic Valve: Bicuspid with mild calcification. Area by planimetry over 1.5 cm2. By echo the mean gradient across the valve was 4mmHg and peak gradient 51 mmHg.  Aorta: Severe dilatation of the ascending aortic root particularly at the level of the RPA. Orthogonal measurements are similar to scan done in 2011. 4.5 x 4.4 cm There is no discection, mural hematoma or ulceration. The great vessels arise normally from the arch. Just distal to the left subclavian there is re-coarctation of the aorta. The minimal luminal diameter is 38mm with an area of .9cm2. By echo the mean gradient from the suprasternal notch was 42mmHg and the peak 30 mmHg. By comparison the descending thoracic aorta measures 1.7cm.  Note is also made of a persistant left sided SVC that  drains into the coronary sinus  LV- Mild LVH with septal thickness 12 mm. Normal size and function. 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 Gradient by TTE are mean of 15 mmHg and peak of 30 mmHg.  Had Wheat procedure at [facility] by [name] 07/25/14 involved mechanical AVR with root replacement. No mention of fixing coarcatation Also had MAZE and LAA exclusion Dyspnic. Had diuretic stopped due to azotemia. Lots of nausea. Seen as add on today for marked dyspnea. She called [name] nurse and took a lasix yesterday. She cannot lay flat. Nausea some better off amiodarone which Dr Lovena Le stopped on 08/13/14. INR;s have been high due to interaction and only taking qod. No bleeding issues.          Past Medical History  Diagnosis Date  . Bicuspid aortic valve   . Aortic stenosis   . Paroxysmal atrial fibrillation   . ATRIAL ARRHYTHMIAS   . THORACIC AORTIC ANEURYSM   . COARCTATION OF AORTA   . ENDOMETRIOSIS   . Shortness of breath       Surgical History:  Past Surgical History  Procedure Laterality Date  . Coarctation repair and residual restenosis    . Knee surgery       Home Meds: Prior to Admission medications   Medication Sig Start Date End Date Taking? Authorizing Provider  aspirin 81 MG chewable tablet Chew 81 mg by mouth daily. 08/01/14 08/01/15  Historical Provider, MD  Coenzyme Q10 (COQ-10 PO) Take 1 tablet by mouth daily.    Historical Provider, MD  furosemide (LASIX) 40 MG tablet Take 40 mg by mouth 2 (two) times daily. 08/01/14   Historical Provider, MD  lisinopril (PRINIVIL,ZESTRIL) 5 MG tablet Take 5 mg by mouth daily. 06/28/14   Historical Provider, MD  metoprolol succinate (TOPROL-XL) 25 MG 24 hr tablet Take 1 tablet (25 mg total) by mouth 2 (two) times daily. 03/26/14    Josue Hector, MD  Multiple Vitamin (MULTI-VITAMINS) TABS Take by mouth daily.    Historical Provider, MD  ondansetron (ZOFRAN) 4 MG tablet Take 4 mg by mouth every 8 (eight) hours as needed. FOR NAUSEA 08/13/14   Historical Provider, MD  oxyCODONE (OXY IR/ROXICODONE) 5 MG immediate release tablet Take 5 mg by mouth every 4 (four) hours as needed. 1-2 TABLETS EVERY 4 HOURS AS NEEDED FOR PAIN 08/01/14   Historical Provider, MD  potassium chloride SA (K-DUR,KLOR-CON) 20 MEQ tablet Take 20 mEq by mouth 2 (two) times daily. 08/01/14   Historical Provider, MD  promethazine (PHENERGAN) 25 MG tablet Take 25 mg by mouth. 08/03/14   Historical Provider, MD  propafenone (RYTHMOL) 225 MG tablet Take 225 mg by mouth every 8 (eight) hours. 08/05/14   Historical Provider, MD  warfarin (COUMADIN) 1 MG tablet Take 1 mg by mouth every other day. 1/2 TABLET EVERY OTHER NIGHT 08/01/14 08/01/15  Historical Provider, MD    Inpatient Medications:  . [START ON 08/23/2014] aspirin  81 mg Oral Daily  . heparin  4,000 Units Intravenous Once  . metoprolol succinate  25 mg Oral BID  . potassium chloride      . potassium chloride SA  20 mEq Oral BID  . propafenone  225 mg Oral 3 times per day  . Warfarin - Pharmacist Dosing Inpatient   Does not apply q1800     Allergies:  Allergies  Allergen Reactions  . Amiodarone Nausea Only  . Penicillins     History   Social History  . Marital Status: Married    Spouse Name: N/A  . Number of Children: N/A  . Years of Education: N/A   Occupational History  . Not on file.   Social History Main Topics  . Smoking status: Never Smoker   . Smokeless tobacco: Never Used  . Alcohol Use: No  . Drug Use: No  . Sexual Activity: No   Other Topics Concern  . Not on file   Social History Narrative     Family History  Problem Relation Age of Onset  . Family history unknown: Yes     ROS:  Please see the history of present illness.     All other systems reviewed and negative.     Physical Exam:   Blood pressure 122/80, pulse 118, resp. rate 27, last menstrual period 07/16/2013, SpO2 98 %. General: Well developed, well nourished female in no acute distress. Head: Normocephalic, atraumatic, sclera non-icteric, no xanthomas, nares are without discharge. EENT: normal Lymph Nodes:  none Back: without scoliosis/kyphosis , no CVA tendersness Neck: Negative for carotid bruits. JVD not elevated. Lungs: Clear bilaterally to auscultation without wheezes, rales, or rhonchi. Breathing is unlabored. Heart: RRR with S1 mechanical S2. 2/6 systolic murmur , rubs, or gallops appreciated. Abdomen: Soft, non-tender, non-distended with normoactive bowel sounds. No hepatomegaly. No rebound/guarding. No obvious abdominal masses. Msk:  Strength and tone appear normal for age. Extremities: No clubbing or cyanosis. No* edema.  Distal pedal pulses are 2+ and equal bilaterally. Skin: Warm and Dry Neuro: Alert and oriented X 3. CN III-XII intact Grossly normal sensory and motor function . Psych:  Responds to questions appropriately with a normal affect.      Labs: Cardiac Enzymes No results for input(s): CKTOTAL, CKMB, TROPONINI in the last 72 hours. CBC Lab Results  Component Value Date   WBC 13.7* 08/22/2014   HGB 10.5* 08/22/2014   HCT 35.6* 08/22/2014   MCV 90.1 08/22/2014   PLT 577* 08/22/2014   PROTIME:  Recent Labs  08/22/14 1515  LABPROT 16.3*  INR 1.30   Chemistry  Recent Labs Lab 08/22/14 1515  NA 136  K 3.0*  CL 92*  CO2 32  BUN 8  CREATININE 1.05  CALCIUM 8.6  PROT 6.9  BILITOT 1.8*  ALKPHOS 100  ALT 30  AST 34  GLUCOSE 93   Lipids No results found for: CHOL, HDL, LDLCALC, TRIG BNP PRO B NATRIURETIC PEPTIDE (BNP)  Date/Time Value Ref Range Status  04/23/2010 11:46 AM 61.9 0.0-100.0 pg/mL Final  03/12/2010 11:40 AM 85.3 0.0-100.0 pg/mL Final   Thyroid Function Tests:  Recent Labs  08/22/14 1515  TSH 3.211      Miscellaneous Lab  Results  Component Value Date   DDIMER 7.68* 08/22/2014    Radiology/Studies:  Dg Chest Left Decubitus  08/22/2014   CLINICAL DATA:  Pleural effusion  EXAM: CHEST - LEFT DECUBITUS  COMPARISON:  Film from earlier in the same day  FINDINGS: Left-side-down decubitus view demonstrates a large mobile pleural effusion on the  left. Smaller mobile pleural effusion on the right is noted as well.  IMPRESSION: Bilateral mobile pleural effusions.   Electronically Signed   By: Inez Catalina M.D.   On: 08/22/2014 17:33   Dg Chest Port 1 View  08/22/2014   CLINICAL DATA:  Shortness of breath and pleural effusions  EXAM: PORTABLE CHEST - 1 VIEW  COMPARISON:  03/20/2014  FINDINGS: Cardiac shadow is prominent. Postsurgical changes are now seen. Small pleural effusions are noted bilaterally. Mild atelectatic changes are noted as well.  IMPRESSION: Bibasilar atelectasis and pleural effusions.   Electronically Signed   By: Inez Catalina M.D.   On: 08/22/2014 17:31    EKG:   Not available Tele now regular rhythm at 80 prob flutter with block   ECG pending   Assessment and Plan  Atrial fibrillation/flutter with a rapid ventricular response  Congestive heart failure-acute on chronic-diastolic  History of bicuspid aortic valve and coarctation status post recent WHEAT procedure  Intolerance of amiodarone  Hypokalemia  Normal left ventricular function and normal left atrial size  The patient's congestive heart failure is likely related to the rapid atrial rate. She has tolerated rate titration with diltiazem. We will resume her propafenone but will need probably to undertake a TEE first as there is some potential for reversion to sinus rhythm. Left atrial exclusion can be incomplete  Somebody's idea to exclude PE seems to be really wise,  i wish i had thought about it  Agree with IV heparin Pharmacy for coumadin TEE but timing may be more elective with evidence of very good rate control if symptoms are  minimal and CTA neg.  K repletion  Virl Axe '

## 2014-08-22 NOTE — Care Management Note (Signed)
    Page 1 of 1   08/22/2014     2:21:53 PM CARE MANAGEMENT NOTE 08/22/2014  Patient:  Mallory Sutton, Mallory Sutton   Account Number:  [id number]  Date Initiated:  08/22/2014  Documentation initiated by:  Elissa Hefty  Subjective/Objective Assessment:   adm w heart failure     Action/Plan:   lives w husband, pcp d r lawrence fusco   Anticipated DC Date:     Anticipated DC Plan:  [place]         Choice offered to / List presented to:             Status of service:   Medicare Important Message given?   (If response is "NO", the following Medicare IM given date fields will be blank) Date Medicare IM given:   Medicare IM given by:   Date Additional Medicare IM given:   Additional Medicare IM given by:    Discharge Disposition:    Per UR Regulation:  Reviewed for med. necessity/level of care/duration of stay  If discussed at [place] of Stay Meetings, dates discussed:    Comments:

## 2014-08-23 ENCOUNTER — Other Ambulatory Visit: Payer: Self-pay

## 2014-08-23 ENCOUNTER — Inpatient Hospital Stay (HOSPITAL_COMMUNITY): Payer: BLUE CROSS/BLUE SHIELD

## 2014-08-23 ENCOUNTER — Encounter (HOSPITAL_COMMUNITY): Payer: Self-pay | Admitting: *Deleted

## 2014-08-23 DIAGNOSIS — J90 Pleural effusion, not elsewhere classified: Secondary | ICD-10-CM | POA: Insufficient documentation

## 2014-08-23 DIAGNOSIS — I4891 Unspecified atrial fibrillation: Secondary | ICD-10-CM

## 2014-08-23 DIAGNOSIS — J9600 Acute respiratory failure, unspecified whether with hypoxia or hypercapnia: Secondary | ICD-10-CM

## 2014-08-23 DIAGNOSIS — I480 Paroxysmal atrial fibrillation: Secondary | ICD-10-CM

## 2014-08-23 DIAGNOSIS — R0600 Dyspnea, unspecified: Secondary | ICD-10-CM | POA: Insufficient documentation

## 2014-08-23 DIAGNOSIS — I5031 Acute diastolic (congestive) heart failure: Secondary | ICD-10-CM | POA: Insufficient documentation

## 2014-08-23 LAB — BASIC METABOLIC PANEL
Anion gap: 11 (ref 5–15)
BUN: 8 mg/dL (ref 6–23)
CO2: 31 mmol/L (ref 19–32)
Calcium: 8.2 mg/dL — ABNORMAL LOW (ref 8.4–10.5)
Chloride: 94 mmol/L — ABNORMAL LOW (ref 96–112)
Creatinine, Ser: 0.89 mg/dL (ref 0.50–1.10)
GFR calc Af Amer: 85 mL/min — ABNORMAL LOW (ref >90)
GFR calc non Af Amer: 73 mL/min — ABNORMAL LOW (ref >90)
Glucose, Bld: 150 mg/dL — ABNORMAL HIGH (ref 70–99)
Potassium: 4 mmol/L (ref 3.5–5.1)
Sodium: 136 mmol/L (ref 135–145)

## 2014-08-23 LAB — CBC
HCT: 32.5 % — ABNORMAL LOW (ref 36.0–46.0)
Hemoglobin: 9.7 g/dL — ABNORMAL LOW (ref 12.0–15.0)
MCH: 27.2 pg (ref 26.0–34.0)
MCHC: 29.8 g/dL — ABNORMAL LOW (ref 30.0–36.0)
MCV: 91 fL (ref 78.0–100.0)
Platelets: 459 10*3/uL — ABNORMAL HIGH (ref 150–400)
RBC: 3.57 MIL/uL — ABNORMAL LOW (ref 3.87–5.11)
RDW: 15.7 % — ABNORMAL HIGH (ref 11.5–15.5)
WBC: 13 10*3/uL — ABNORMAL HIGH (ref 4.0–10.5)

## 2014-08-23 LAB — HEPARIN LEVEL (UNFRACTIONATED): HEPARIN UNFRACTIONATED: 0.34 [IU]/mL (ref 0.30–0.70)

## 2014-08-23 LAB — PROTIME-INR
INR: 1.31 (ref 0.00–1.49)
Prothrombin Time: 16.4 seconds — ABNORMAL HIGH (ref 11.6–15.2)

## 2014-08-23 MED ORDER — DILTIAZEM HCL 30 MG PO TABS
30.0000 mg | ORAL_TABLET | Freq: Four times a day (QID) | ORAL | Status: DC
  Administered 2014-08-23 – 2014-08-24 (×4): 30 mg via ORAL
  Filled 2014-08-23 (×8): qty 1

## 2014-08-23 MED ORDER — ALBUTEROL SULFATE (2.5 MG/3ML) 0.083% IN NEBU
2.5000 mg | INHALATION_SOLUTION | Freq: Once | RESPIRATORY_TRACT | Status: AC
  Administered 2014-08-23: 2.5 mg via RESPIRATORY_TRACT

## 2014-08-23 MED ORDER — METOPROLOL SUCCINATE ER 25 MG PO TB24
25.0000 mg | ORAL_TABLET | Freq: Every day | ORAL | Status: DC
  Administered 2014-08-24: 25 mg via ORAL
  Filled 2014-08-23: qty 1

## 2014-08-23 MED ORDER — FUROSEMIDE 10 MG/ML IJ SOLN: 40.0000 mg | Freq: Two times a day (BID) | INTRAMUSCULAR | Status: DC

## 2014-08-23 MED ORDER — ALBUTEROL SULFATE (2.5 MG/3ML) 0.083% IN NEBU
INHALATION_SOLUTION | RESPIRATORY_TRACT | Status: AC
  Filled 2014-08-23: qty 3

## 2014-08-23 MED ORDER — METOPROLOL SUCCINATE ER 25 MG PO TB24: 25.0000 mg | ORAL_TABLET | Freq: Every day | ORAL | Status: DC

## 2014-08-23 MED ORDER — FUROSEMIDE 10 MG/ML IJ SOLN
40.0000 mg | Freq: Two times a day (BID) | INTRAMUSCULAR | Status: DC
  Administered 2014-08-23 – 2014-08-24 (×3): 40 mg via INTRAVENOUS
  Filled 2014-08-23 (×5): qty 4

## 2014-08-23 MED ORDER — DILTIAZEM HCL 30 MG PO TABS: 30.0000 mg | ORAL_TABLET | Freq: Four times a day (QID) | ORAL | Status: DC

## 2014-08-23 MED ORDER — LIDOCAINE HCL 1 % IJ SOLN
INTRAMUSCULAR | Status: AC
  Administered 2014-08-23: 11:00:00
  Filled 2014-08-23: qty 20

## 2014-08-23 MED ORDER — ONDANSETRON HCL 4 MG/2ML IJ SOLN: 4.0000 mg | Freq: Four times a day (QID) | INTRAMUSCULAR | Status: DC | PRN

## 2014-08-23 MED ORDER — PROPAFENONE HCL ER 225 MG PO CP12
225.0000 mg | ORAL_CAPSULE | Freq: Two times a day (BID) | ORAL | Status: DC
  Filled 2014-08-23 (×2): qty 1

## 2014-08-23 MED ORDER — POTASSIUM CHLORIDE CRYS ER 20 MEQ PO TBCR
20.0000 meq | EXTENDED_RELEASE_TABLET | Freq: Every day | ORAL | Status: DC
  Administered 2014-08-24: 20 meq via ORAL
  Filled 2014-08-23 (×2): qty 1

## 2014-08-23 NOTE — Progress Notes (Addendum)
ANTICOAGULATION CONSULT NOTE   Pharmacy Consult for Heparin Indication: Mechanical aortic valve/Afib  Allergies  Allergen Reactions  . Amiodarone Nausea Only  . Penicillins     Patient Measurements: Height: 5\' 4"  (162.6 cm) Weight: 133 lb 9.6 oz (60.6 kg) IBW/kg (Calculated) : 54.7 Heparin Dosing Weight: 58.2 kg  Vital Signs: Temp: 97.8 F (36.6 C) (04/21 0006) Temp Source: Oral (04/21 0006) BP: 124/51 mmHg (04/21 0500) Pulse Rate: 76 (04/21 0500)  Labs:  Recent Labs  08/22/14 1515 08/23/14 0500  HGB 10.5* 9.7*  HCT 35.6* 32.5*  PLT 577* 459*  LABPROT 16.3* 16.4*  INR 1.30 1.31  HEPARINUNFRC  --  0.34  CREATININE 1.05  --     Estimated Creatinine Clearance: 54.1 mL/min (by C-G formula based on Cr of 1.05).  Assessment: 54 y.o.female with h/o AVR/AFib, subtherapeutic INR and Coumadin on hold, , for heparin  Goal of Therapy:  Heparin level 0.3-0.7 units/ml Monitor platelets by anticoagulation protocol: Yes   Plan:  Continue Heparin at current rate  Recheck level later this morning to verify.  Phillis Knack, PharmD, BCPS

## 2014-08-23 NOTE — Discharge Summary (Signed)
Patient ID: Mallory Sutton,  MRN: [id number], DOB/AGE: May 09, 1960 53 y.o.  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 Followed since 99. Had coarctation repair at age 74 with left lateral thoracotomy. W/U in past has been limited by lack of insurance. She has a known bicuspid AV with ascending aortic aneurysm and re-coarctation. She has PAF- previously controlled with Rythmol Her echo in August 2015 showed progression of her AS gradient with Mean 41 mmHg, now severe AS. She ultimately went to [facility] and had "Wheat procedure" by [name] 07/25/14. Peri op she had been taken off Ryhtmol and placed on Amiodarone but she did not tolerate this (GI) and her Coumadin was difficult to control. Amiodarone was stopped 08/13/14.         She was seen in the office by Dr [name] 08/22/14 and was significantly dyspneic. She said she was worse when flat. She was admitted to CCU with a diagnosis of CHF and pleural effusions. When she arrived in the unit she was in rapid AF (reportedly NSR in the office). She was treated with IV Lasix 40 and Diltiazem for rate control though this caused some hypotension. Labs returned showing a D-dimer of 7.68 and an INR of 1.3. Chest CTA was obtained and the pt was started  on Heparin. Dr Caryl Comes saw the pt in consult for EP and it was suggested we resume Rythmol at her previous dose- 225 mg TID and we planned to TEE cardiovert her in the am. Her CTA came back negative for PE. She did have bilateral pleural effusions and interventional radiology was asked to perform thoracentesis. This was done 4/21 on the Lt side- 1L bloody fluid. Pt tolerated this well. Because there was frank blood and the pt recently had surgery, Dr Radford Pax discussed with our heart surgeons here who suggested she may need to be transferred to [facility] for further evaluation. In the meantime EP has decided to hold on Propafenone till her Amiodarone level returns, the pt did convert spontaneously to NSR. Heparin has also been held.   Discharge Vitals:  Blood pressure 119/46, pulse 95, temperature 98 F (36.7 C), temperature source Oral, resp. rate 35, height 5\' 4"  (1.626 m), weight 131 lb 13.4 oz (59.8 kg), last menstrual period 07/16/2013, SpO2 89 %.    Labs: Results for orders placed or performed during the hospital encounter of 08/22/14 (from the past 24 hour(s))  CBC     Status: Abnormal   Collection Time: 08/23/14  5:00 AM  Result Value Ref Range   WBC 13.0 (H) 4.0 - 10.5 K/uL  RBC 3.57 (L) 3.87 - 5.11 MIL/uL   Hemoglobin 9.7 (L) 12.0 - 15.0 g/dL   HCT 32.5 (L) 36.0 - 46.0 %   MCV 91.0 78.0 - 100.0 fL   MCH 27.2 26.0 - 34.0 pg   MCHC 29.8 (L) 30.0 - 36.0 g/dL   RDW 15.7 (H) 11.5 - 15.5 %   Platelets 459 (H) 150 - 400 K/uL  Basic metabolic panel     Status: Abnormal   Collection Time: 08/23/14  5:00 AM  Result Value Ref Range   Sodium 136 135 - 145 mmol/L   Potassium 4.0 3.5 - 5.1 mmol/L   Chloride 94 (L) 96 - 112 mmol/L   CO2 31 19 - 32 mmol/L   Glucose, Bld 150 (H) 70 - 99 mg/dL   BUN 8 6 - 23 mg/dL   Creatinine, Ser 0.89 0.50 - 1.10 mg/dL   Calcium 8.2 (L) 8.4 - 10.5 mg/dL   GFR calc non Af Amer 73 (L) >90 mL/min   GFR calc Af Amer 85 (L) >90 mL/min   Anion gap 11 5 - 15    Protime-INR     Status: Abnormal   Collection Time: 08/23/14  5:00 AM  Result Value Ref Range   Prothrombin Time 16.4 (H) 11.6 - 15.2 seconds   INR 1.31 0.00 - 1.49  Heparin level (unfractionated)     Status: None   Collection Time: 08/23/14  5:00 AM  Result Value Ref Range   Heparin Unfractionated 0.34 0.30 - 0.70 IU/mL    Disposition:      Follow-up Information    Follow up with Jenkins Rouge, MD.   Specialty:  Cardiology   Why:  call office for appointment when you return from [facility] information:   [address] [place] [address] [phone]       Discharge Medications:    Medication List    STOP taking these medications        furosemide 40 MG tablet  Commonly known as:  LASIX  Replaced by:  furosemide 10 MG/ML injection     ondansetron 4 MG tablet  Commonly known as:  ZOFRAN  Replaced by:  ondansetron 4 MG/2ML Soln injection     warfarin 1 MG tablet  Commonly known as:  COUMADIN      TAKE these medications        aspirin 81 MG chewable tablet  Chew 81 mg by mouth daily.     COQ-10 PO  Take 1 tablet by mouth daily.     diltiazem 30 MG tablet  Commonly known as:  CARDIZEM  Take 1 tablet (30 mg total) by mouth every 6 (six) hours.     furosemide 10 MG/ML injection  Commonly known as:  LASIX  Inject 4 mLs (40 mg total) into the vein 2 (two) times daily.     metoprolol succinate 25 MG 24 hr tablet  Commonly known as:  TOPROL-XL  Take 1 tablet (25 mg total) by mouth daily.  Start taking on:  08/24/2014     MULTI-VITAMINS Tabs  Take by mouth daily.     ondansetron 4 MG/2ML Soln injection  Commonly known as:  ZOFRAN  Inject 2 mLs (4 mg total) into the vein every 6 (six) hours as needed for nausea.     oxyCODONE 5 MG immediate release tablet  Commonly known as:  Oxy IR/ROXICODONE  Take 5-10 mg by mouth every 4 (four) hours as needed for moderate  pain. 1-2 TABLETS EVERY 4 HOURS AS NEEDED FOR PAIN     potassium  chloride SA 20 MEQ tablet  Commonly known as:  K-DUR,KLOR-CON  Take 20 mEq by mouth daily.         Duration of Discharge Encounter: Greater than 30 minutes including physician time.  Angelena Form PA-C 08/23/2014 6:03 PM

## 2014-08-23 NOTE — Progress Notes (Signed)
Discussed with Dr. Jimmye Norman at [facility] who is the cardiac surgical fellow on call for Dr. Ysidro Evert and they will accept patient in transfer to their ICU.  In review of her records over the past few week her INR was over 7 on 08/06/2014 so I suspect that she bled into her pleural space a that time.  Hbg was 8.6 on 4/13 but today is actually better at 9.  She is currently off anticoaguation and on SCD for DVT prophylaxis.  Will tranfer to [facility].

## 2014-08-23 NOTE — Progress Notes (Signed)
[facility] for heparin IV/warfarin  Indication: mechanical aortic valve/afib  Allergies  Allergen Reactions  . Amiodarone Nausea Only  . Penicillins     Patient Measurements: Height: 5\' 4"  (162.6 cm) Weight: 131 lb 13.4 oz (59.8 kg) IBW/kg (Calculated) : 54.7  Vital Signs: Temp: 98.2 F (36.8 C) (04/21 1200) Temp Source: Oral (04/21 1200) BP: 123/45 mmHg (04/21 1400) Pulse Rate: 87 (04/21 1400)  Labs:  Recent Labs  08/22/14 1515 08/23/14 0500  HGB 10.5* 9.7*  HCT 35.6* 32.5*  PLT 577* 459*  LABPROT 16.3* 16.4*  INR 1.30 1.31  HEPARINUNFRC  --  0.34  CREATININE 1.05 0.89    Estimated Creatinine Clearance: 63.9 mL/min (by C-G formula based on Cr of 0.89).   Medical History: Past Medical History  Diagnosis Date  . Bicuspid aortic valve   . Aortic stenosis   . Paroxysmal atrial fibrillation   . ATRIAL ARRHYTHMIAS   . THORACIC AORTIC ANEURYSM   . COARCTATION OF AORTA   . ENDOMETRIOSIS   . Shortness of breath    Assessment: 54 yof with complex cardiac issues. Followed since '99. Had coarctation repair at age 54 with left lateral thoracotomy. Wheat procedure at [facility] 07/25/14 involving mechanical AVR, root replacement, maze procedure, and LAA exclusion. CT negative for PE.  PTA Warfarin still titration to stable dose, Last outpatient INR 4/11 was 3.5 per patient and dose was decreased to 0.5 mg qod, last dose was PM of 4/18 (dose was reduced while patient was switched to amiodarone - may need increased dose since now off amio).  INR stable subtherapeutic 1.31>>1.3.  Hg 9.7 stable, plt 459.  Patient had 1L of bloody fluid removed via L thoracentesis on 4/21. Per Dr. Radford Pax, hold all anticoagulation for now. Patient is scheduled to have a R thoracentesis tomorrow (4/22). Dr. Radford Pax to follow-up on patient's status.  Goal of Therapy:  INR 2-3 (per [facility] records) Monitor platelets by anticoagulation protocol: Yes   Plan:  Hold  heparin/warfarin with bloody fluid removed via thoracentesis per Dr. Radford Pax Follow-up anticoagulation plan after 4/22 thoracentesis Daily HL/CBC Daily INR Mon s/sx bleeding  Elicia Lamp, PharmD Clinical Pharmacist - Resident Pager [phone] 08/23/2014 2:51 PM

## 2014-08-23 NOTE — Procedures (Signed)
Successful US guided left thoracentesis. Yielded 1 liter of bloody fluid. Pt tolerated procedure well. No immediate complications.  Specimen was not sent for labs. CXR ordered.  Tsosie Billing D PA-C 08/23/2014 10:38 AM

## 2014-08-23 NOTE — Progress Notes (Addendum)
Chest xrays reviewed with Radiologist from yesterday and today post left thoracentesis. She still has a significant effusion on the right so will set up for US guided right thoracentesis tomorrow.  Per Radiology PA, pleural fluid was frank blood. Unfortunately when thoracentesis ordered last night no order was placed with procedure to send fluid for analysis.  The PA says that it was not a traumatic tap.  Will hold on Heparin for now.  Fluid will be sent for analysis tomorrow with second tap.  Given that this was frank blood with recent thoracic surgery I will ask CVTS surgery to evaluate her.  Also discussed antiarrhythmic therapy with Dr. Lovena Le.  Patient was on Amio starting 3/8 and stopped on 4/11.  He recommends no propafenone until an amio level has been obtained. Will send off Amio level now.  She is maintaining NSR.

## 2014-08-23 NOTE — Progress Notes (Addendum)
SUBJECTIVE:  Feels better today but visibly still SOB at rest  OBJECTIVE:   Vitals:   Filed Vitals:   08/23/14 0600 08/23/14 0614 08/23/14 0700 08/23/14 0800  BP: 114/50  114/48 118/48  Pulse: 75  71 73  Temp:    98 F (36.7 C)  TempSrc:    Oral  Resp: 26  19 23   Height:      Weight:  131 lb 13.4 oz (59.8 kg)    SpO2: 88%  99% 97%   I&O's:   Intake/Output Summary (Last 24 hours) at 08/23/14 0859 Last data filed at 08/23/14 0700  Gross per 24 hour  Intake 451.25 ml  Output   2100 ml  Net -1648.75 ml   TELEMETRY: Reviewed telemetry pt in NSR:     PHYSICAL EXAM General: Well developed, well nourished, in no acute distress Head: Eyes PERRLA, No xanthomas.   Normal cephalic and atramatic  Lungs: decreased BS at bases bilaterally with crackles Heart:   HRRR S1 S2 Pulses are 2+ & equal. Abdomen: Bowel sounds are positive, abdomen soft and non-tender without masses  Extremities:   No clubbing, cyanosis.  DP +1 B/L LE edema 1-2+ Neuro: Alert and oriented X 3. Psych:  Good affect, responds appropriately   LABS: Basic Metabolic Panel:  Recent Labs  08/22/14 1515 08/23/14 0500  NA 136 136  K 3.0* 4.0  CL 92* 94*  CO2 32 31  GLUCOSE 93 150*  BUN 8 8  CREATININE 1.05 0.89  CALCIUM 8.6 8.2*   Liver Function Tests:  Recent Labs  08/22/14 1515  AST 34  ALT 30  ALKPHOS 100  BILITOT 1.8*  PROT 6.9  ALBUMIN 3.1*   No results for input(s): LIPASE, AMYLASE in the last 72 hours. CBC:  Recent Labs  08/22/14 1515 08/23/14 0500  WBC 13.7* 13.0*  HGB 10.5* 9.7*  HCT 35.6* 32.5*  MCV 90.1 91.0  PLT 577* 459*   Cardiac Enzymes: No results for input(s): CKTOTAL, CKMB, CKMBINDEX, TROPONINI in the last 72 hours. BNP: Invalid input(s): POCBNP D-Dimer:  Recent Labs  08/22/14 1515  DDIMER 7.68*   Hemoglobin A1C: No results for input(s): HGBA1C in the last 72 hours. Fasting Lipid Panel: No results for input(s): CHOL, HDL, LDLCALC, TRIG, CHOLHDL,  LDLDIRECT in the last 72 hours. Thyroid Function Tests:  Recent Labs  08/22/14 1515  TSH 3.211   Anemia Panel: No results for input(s): VITAMINB12, FOLATE, FERRITIN, TIBC, IRON, RETICCTPCT in the last 72 hours. Coag Panel:   Lab Results  Component Value Date   INR 1.31 08/23/2014   INR 1.30 08/22/2014   INR 5.9 08/09/2014    RADIOLOGY: Ct Angio Chest Pe W/cm &/or Wo Cm  08/22/2014   CLINICAL DATA:  Dyspnea and pleural effusions, recent aortic surgery  EXAM: CT ANGIOGRAPHY CHEST WITH CONTRAST  TECHNIQUE: Multidetector CT imaging of the chest was performed using the standard protocol during bolus administration of intravenous contrast. Multiplanar CT image reconstructions and MIPs were obtained to evaluate the vascular anatomy.  CONTRAST:  60mL OMNIPAQUE IOHEXOL 350 MG/ML SOLN  COMPARISON:  09/09/2011, 08/22/2014  FINDINGS: Large bilateral pleural effusions are again identified with associated lower lobe atelectasis. No focal confluent infiltrate is seen. No pneumothorax is noted. No pulmonary nodules are seen.  There postsurgical changes noted in the ascending aorta and aortic valve consistent with the given clinical history of Wheat procedure. Minimal intimal irregularity is noted at the surgical site distally. Additionally there are changes consistent with repair  of prior coarctation of the aorta. Recurrent coarctation is noted but stable in appearance from a prior exam dated 09/09/2011. The previously seen thoracic aortic aneurysm has resolved secondary to the procedure. The maximum dimension of the coarctated aorta is 9.7 mm. The brachiocephalic vessels are within normal limits. The distal descending thoracic aorta is within normal limits as well. Limited evaluation of coronary artery shows them to be patent. Changes consistent with prior Maze procedure are noted as well. Scanning into the upper abdomen reveals no acute abnormality. No bony abnormality is noted.  The pulmonary artery pulmonary  artery demonstrates a normal branching pattern. No filling defects to suggest pulmonary embolism are noted.  Review of the MIP images confirms the above findings.  IMPRESSION: Postsurgical changes in the ascending aorta and aortic valve as described.  Changes consistent with recurrent coarctation of the aorta with a diameter of 9 mm at the point of maximum narrowing. This appears stable from the previous exam.  No evidence of pulmonary embolism.  Bilateral pleural effusions with associated lower lobe atelectatic changes.  Although not mentioned in the body of the report, a right-sided PICC line is now seen with the catheter tip at cavoatrial junction.   Electronically Signed   By: Inez Catalina M.D.   On: 08/22/2014 20:27   Dg Chest Left Decubitus  08/22/2014   CLINICAL DATA:  Pleural effusion  EXAM: CHEST - LEFT DECUBITUS  COMPARISON:  Film from earlier in the same day  FINDINGS: Left-side-down decubitus view demonstrates a large mobile pleural effusion on the left. Smaller mobile pleural effusion on the right is noted as well.  IMPRESSION: Bilateral mobile pleural effusions.   Electronically Signed   By: Inez Catalina M.D.   On: 08/22/2014 17:33   Dg Chest Port 1 View  08/23/2014   CLINICAL DATA:  Increasing shortness of breath  EXAM: PORTABLE CHEST - 1 VIEW  COMPARISON:  08/22/2014  FINDINGS: Right PICC, with catheter tip projecting over the cavoatrial junction. Status post median sternotomy. Aortic valve replacement and left atrial appendage clip. Bilateral pleural effusions. Associated airspace opacities. Interstitial prominence of the aerated lungs. Enlarged cardiac silhouette. Central vascular congestion. No pneumothorax. No acute osseous finding.  IMPRESSION: Similar bilateral pleural effusions and associated airspace opacities ; atelectasis (favored) versus pneumonia.  Enlarged cardiac silhouette.  Mild interstitial edema.  Postoperative changes of aortic valve replacement and left atrial appendage  clip.   Electronically Signed   By: Carlos Levering M.D.   On: 08/23/2014 05:47   Dg Chest Port 1 View  08/22/2014   CLINICAL DATA:  Shortness of breath and pleural effusions  EXAM: PORTABLE CHEST - 1 VIEW  COMPARISON:  03/20/2014  FINDINGS: Cardiac shadow is prominent. Postsurgical changes are now seen. Small pleural effusions are noted bilaterally. Mild atelectatic changes are noted as well.  IMPRESSION: Bibasilar atelectasis and pleural effusions.   Electronically Signed   By: Inez Catalina M.D.   On: 08/22/2014 17:31   Assessment and Plan 1.  Acute respiratory failure secondary to bilateral effusions (large on left) by chest xray.Feels better this am.  Plan for left sided thoracentesis today.   2.  Acute diastolic CHF: Had small pleural effusion on d/c from [facility] diuretics stopped.  2D echo showed  Normal LVF with stable mechanical AVR and small RV with very small pericardial effusion.  There were large right and left pleural effusions noted.  CVP last night 16.  Start lasix 40 iv bid 3.  Congenital heart disease with  bicuspid AV and ascending aortic aneurysm with prior coarctation repair and persistent left sided SVC and prior sinus venosis ASD repair: s/p mechanical AVR and root replacement and  MAZE.  Dr. [name] plans to call [name] in regards to CT findings of recurrent coarct at 61mm at maximum point of narrowing - not clear why coarctation was not addressed unless this was part of arch repair.  Check suprasternal notch CW doppler on echo with repeat limited echo. Continue ASA.  Warfarin on hold for thoracentesis.  Will restart after thoracentesis.  Will restart Heparin when IR recommends post thoracentesis with no bolus. 4.  PAF: Amiodarone stopped nausea better.  Now back in NSR.  Continue BB and change IV Cardizem to PO 30mg  q6 hours.  Will ask EP when propofenone can be resumed as she was on this prior to surgery. Post MAZE and LAALigation.  IV Heparin on hold due to thoracentesis. 5.   Chronic systemic anticoagulation with subtherapeutic INR on admission.  Warfarin and Heparin on hold for thoracentesis.   Sueanne Margarita, MD  08/23/2014  8:59 AM

## 2014-08-23 NOTE — Progress Notes (Signed)
Nurse requested STAT HHN. Tx tolerated well

## 2014-08-24 DIAGNOSIS — Z954 Presence of other heart-valve replacement: Secondary | ICD-10-CM

## 2014-08-24 DIAGNOSIS — R0602 Shortness of breath: Secondary | ICD-10-CM

## 2014-08-24 DIAGNOSIS — I712 Thoracic aortic aneurysm, without rupture: Secondary | ICD-10-CM

## 2014-08-24 LAB — CBC
HCT: 29.5 % — ABNORMAL LOW (ref 36.0–46.0)
Hemoglobin: 8.9 g/dL — ABNORMAL LOW (ref 12.0–15.0)
MCH: 27.6 pg (ref 26.0–34.0)
MCHC: 30.2 g/dL (ref 30.0–36.0)
MCV: 91.3 fL (ref 78.0–100.0)
PLATELETS: 332 10*3/uL (ref 150–400)
RBC: 3.23 MIL/uL — ABNORMAL LOW (ref 3.87–5.11)
RDW: 15.5 % (ref 11.5–15.5)
WBC: 8.2 10*3/uL (ref 4.0–10.5)

## 2014-08-24 LAB — BASIC METABOLIC PANEL
Anion gap: 9 (ref 5–15)
BUN: 8 mg/dL (ref 6–23)
CO2: 32 mmol/L (ref 19–32)
Calcium: 7.8 mg/dL — ABNORMAL LOW (ref 8.4–10.5)
Chloride: 93 mmol/L — ABNORMAL LOW (ref 96–112)
Creatinine, Ser: 0.8 mg/dL (ref 0.50–1.10)
GFR calc Af Amer: >90 mL/min (ref >90)
GFR calc non Af Amer: 83 mL/min — ABNORMAL LOW (ref >90)
Glucose, Bld: 89 mg/dL (ref 70–99)
Potassium: 3.5 mmol/L (ref 3.5–5.1)
Sodium: 134 mmol/L — ABNORMAL LOW (ref 135–145)

## 2014-08-24 LAB — PROTIME-INR
INR: 1.32 (ref 0.00–1.49)
Prothrombin Time: 16.6 seconds — ABNORMAL HIGH (ref 11.6–15.2)

## 2014-08-24 LAB — HEPARIN LEVEL (UNFRACTIONATED): Heparin Unfractionated: <0.1 IU/mL — ABNORMAL LOW (ref 0.30–0.70)

## 2014-08-24 MED ORDER — METOPROLOL SUCCINATE 12.5 MG HALF TABLET
12.5000 mg | ORAL_TABLET | Freq: Every day | ORAL | Status: DC
Start: 2014-08-25 — End: 2014-08-24

## 2014-08-24 NOTE — Progress Notes (Addendum)
Spoke with Ruthell Rummage of [facility], [phone].  Bed management.  Should have a bed for the patient shortly, awaiting return call from him regarding bed.  Discussed transportation arrangements.  We will get [facility] to transport pt.  Carol Ada, RN    [facility] has been called, awaiting their arrival to pick pt up  Carol Ada, RN

## 2014-08-24 NOTE — Progress Notes (Signed)
Patient picked up by [facility] for transport to [facility].

## 2014-08-24 NOTE — Progress Notes (Signed)
[place] for heparin IV/warfarin  Indication: mechanical aortic valve/afib  Allergies  Allergen Reactions  . Amiodarone Nausea Only  . Penicillins     Patient Measurements: Height: 5\' 4"  (162.6 cm) Weight: 129 lb 3 oz (58.6 kg) IBW/kg (Calculated) : 54.7  Vital Signs: Temp: 98.2 F (36.8 C) (04/22 0800) Temp Source: Oral (04/22 0800) BP: 117/61 mmHg (04/22 0830) Pulse Rate: 83 (04/22 0830)  Labs:  Recent Labs  08/22/14 1515 08/23/14 0500 08/24/14 0420  HGB 10.5* 9.7* 8.9*  HCT 35.6* 32.5* 29.5*  PLT 577* 459* 332  LABPROT 16.3* 16.4* 16.6*  INR 1.30 1.31 1.32  HEPARINUNFRC  --  0.34 <0.10*  CREATININE 1.05 0.89 0.80    Estimated Creatinine Clearance: 71 mL/min (by C-G formula based on Cr of 0.8).   Medical History: Past Medical History  Diagnosis Date  . Bicuspid aortic valve   . Aortic stenosis   . Paroxysmal atrial fibrillation   . ATRIAL ARRHYTHMIAS   . THORACIC AORTIC ANEURYSM   . COARCTATION OF AORTA   . ENDOMETRIOSIS   . Shortness of breath    Assessment: 56 yof with complex cardiac issues. Followed since '99. Had coarctation repair at age 6 with left lateral thoracotomy. Wheat procedure at [facility] 07/25/14 involving mechanical AVR, root replacement, maze procedure, and LAA exclusion. CT negative for PE.  PTA Warfarin still titration to stable dose, Last outpatient INR 4/11 was 3.5 per patient and dose was decreased to 0.5 mg qod, last dose was PM of 4/18 (dose was reduced while patient was switched to amiodarone - may need increased dose since now off amio).  INR stable subtherapeutic 1.32.  Hg 8.9, plt decr to wnl.  Patient had 1L of bloody fluid removed via L thoracentesis on 4/21. INR was >7 on 08/06/14 and suspect bled into pleural space at that time. Per Dr. Radford Pax, hold all anticoagulation for now. SCDs on. Patient to transfer to [place] today for further care.  Goal of Therapy:  INR 2-3 (per [facility] records) - but  AVR? Monitor platelets by anticoagulation protocol: Yes   Plan:  Continue to hold heparin/warfarin with bloody fluid removed via thoracentesis per Dr. Radford Pax >> scds Follow-up anticoagulation plan as appropriate Daily HL/CBC Daily INR Mon s/sx bleeding  Elicia Lamp, PharmD Clinical Pharmacist - Resident Pager [phone] 08/24/2014 9:00 AM

## 2014-08-24 NOTE — Progress Notes (Signed)
SUBJECTIVE:  Still SOB talking lying in bed  OBJECTIVE:   Vitals:   Filed Vitals:   08/24/14 0400 08/24/14 0500 08/24/14 0800 08/24/14 0830  BP: 102/44  117/61 117/61  Pulse: 80  83 83  Temp: 97.9 F (36.6 C)  98.2 F (36.8 C)   TempSrc: Axillary  Oral   Resp: 12  27   Height:  5\' 4"  (1.626 m)    Weight:  129 lb 3 oz (58.6 kg)    SpO2: 100%  100%    I&O's:   Intake/Output Summary (Last 24 hours) at 08/24/14 0855 Last data filed at 08/24/14 0800  Gross per 24 hour  Intake    840 ml  Output    975 ml  Net   -135 ml   TELEMETRY: Reviewed telemetry pt in NSR:     PHYSICAL EXAM General: Well developed, well nourished, in no acute distress Head: Eyes PERRLA, No xanthomas.   Normal cephalic and atramatic  Lungs:  decreased BS at bases bilaterally L>R with some crackles Heart:   HRRR S1 S2 Pulses are 2+ & equal. Abdomen: Bowel sounds are positive, abdomen soft and non-tender without masses  Extremities:   No clubbing, cyanosis or edema.  DP +1 Neuro: Alert and oriented X 3. Psych:  Good affect, responds appropriately   LABS: Basic Metabolic Panel:  Recent Labs  08/23/14 0500 08/24/14 0420  NA 136 134*  K 4.0 3.5  CL 94* 93*  CO2 31 32  GLUCOSE 150* 89  BUN 8 8  CREATININE 0.89 0.80  CALCIUM 8.2* 7.8*   Liver Function Tests:  Recent Labs  08/22/14 1515  AST 34  ALT 30  ALKPHOS 100  BILITOT 1.8*  PROT 6.9  ALBUMIN 3.1*   No results for input(s): LIPASE, AMYLASE in the last 72 hours. CBC:  Recent Labs  08/23/14 0500 08/24/14 0420  WBC 13.0* 8.2  HGB 9.7* 8.9*  HCT 32.5* 29.5*  MCV 91.0 91.3  PLT 459* 332   Cardiac Enzymes: No results for input(s): CKTOTAL, CKMB, CKMBINDEX, TROPONINI in the last 72 hours. BNP: Invalid input(s): POCBNP D-Dimer:  Recent Labs  08/22/14 1515  DDIMER 7.68*   Hemoglobin A1C: No results for input(s): HGBA1C in the last 72 hours. Fasting Lipid Panel: No results for input(s): CHOL, HDL, LDLCALC, TRIG,  CHOLHDL, LDLDIRECT in the last 72 hours. Thyroid Function Tests:  Recent Labs  08/22/14 1515  TSH 3.211   Anemia Panel: No results for input(s): VITAMINB12, FOLATE, FERRITIN, TIBC, IRON, RETICCTPCT in the last 72 hours. Coag Panel:   Lab Results  Component Value Date   INR 1.32 08/24/2014   INR 1.31 08/23/2014   INR 1.30 08/22/2014    RADIOLOGY: Dg Chest 1 View  08/23/2014   CLINICAL DATA:  Chest pain, shortness of breath, status post thoracentesis.  EXAM: CHEST  1 VIEW  COMPARISON:  Same day.  FINDINGS: Stable cardiomediastinal silhouette. Sternotomy wires are noted. No pneumothorax is noted. Stable right pleural effusion is noted compared to prior exam. Left pleural effusion is significantly smaller status post thoracentesis. Right-sided PICC line is noted with distal tip overlying expected position of cavoatrial junction. Bony thorax is intact.  IMPRESSION: No pneumothorax is noted status post left-sided thoracentesis. Left pleural effusion is significantly smaller compared to prior exam. Stable right pleural effusion is noted.   Electronically Signed   By: Marijo Conception, M.D.   On: 08/23/2014 11:35   Ct Angio Chest Pe W/cm &/or Wo Cm  08/22/2014   CLINICAL DATA:  Dyspnea and pleural effusions, recent aortic surgery  EXAM: CT ANGIOGRAPHY CHEST WITH CONTRAST  TECHNIQUE: Multidetector CT imaging of the chest was performed using the standard protocol during bolus administration of intravenous contrast. Multiplanar CT image reconstructions and MIPs were obtained to evaluate the vascular anatomy.  CONTRAST:  13mL OMNIPAQUE IOHEXOL 350 MG/ML SOLN  COMPARISON:  09/09/2011, 08/22/2014  FINDINGS: Large bilateral pleural effusions are again identified with associated lower lobe atelectasis. No focal confluent infiltrate is seen. No pneumothorax is noted. No pulmonary nodules are seen.  There postsurgical changes noted in the ascending aorta and aortic valve consistent with the given clinical history  of Wheat procedure. Minimal intimal irregularity is noted at the surgical site distally. Additionally there are changes consistent with repair of prior coarctation of the aorta. Recurrent coarctation is noted but stable in appearance from a prior exam dated 09/09/2011. The previously seen thoracic aortic aneurysm has resolved secondary to the procedure. The maximum dimension of the coarctated aorta is 9.7 mm. The brachiocephalic vessels are within normal limits. The distal descending thoracic aorta is within normal limits as well. Limited evaluation of coronary artery shows them to be patent. Changes consistent with prior Maze procedure are noted as well. Scanning into the upper abdomen reveals no acute abnormality. No bony abnormality is noted.  The pulmonary artery pulmonary artery demonstrates a normal branching pattern. No filling defects to suggest pulmonary embolism are noted.  Review of the MIP images confirms the above findings.  IMPRESSION: Postsurgical changes in the ascending aorta and aortic valve as described.  Changes consistent with recurrent coarctation of the aorta with a diameter of 9 mm at the point of maximum narrowing. This appears stable from the previous exam.  No evidence of pulmonary embolism.  Bilateral pleural effusions with associated lower lobe atelectatic changes.  Although not mentioned in the body of the report, a right-sided PICC line is now seen with the catheter tip at cavoatrial junction.   Electronically Signed   By: Inez Catalina M.D.   On: 08/22/2014 20:27   Dg Chest Left Decubitus  08/22/2014   CLINICAL DATA:  Pleural effusion  EXAM: CHEST - LEFT DECUBITUS  COMPARISON:  Film from earlier in the same day  FINDINGS: Left-side-down decubitus view demonstrates a large mobile pleural effusion on the left. Smaller mobile pleural effusion on the right is noted as well.  IMPRESSION: Bilateral mobile pleural effusions.   Electronically Signed   By: Inez Catalina M.D.   On: 08/22/2014  17:33   Dg Chest Port 1 View  08/23/2014   CLINICAL DATA:  Increasing shortness of breath  EXAM: PORTABLE CHEST - 1 VIEW  COMPARISON:  08/22/2014  FINDINGS: Right PICC, with catheter tip projecting over the cavoatrial junction. Status post median sternotomy. Aortic valve replacement and left atrial appendage clip. Bilateral pleural effusions. Associated airspace opacities. Interstitial prominence of the aerated lungs. Enlarged cardiac silhouette. Central vascular congestion. No pneumothorax. No acute osseous finding.  IMPRESSION: Similar bilateral pleural effusions and associated airspace opacities ; atelectasis (favored) versus pneumonia.  Enlarged cardiac silhouette.  Mild interstitial edema.  Postoperative changes of aortic valve replacement and left atrial appendage clip.   Electronically Signed   By: Carlos Levering M.D.   On: 08/23/2014 05:47   Dg Chest Port 1 View  08/22/2014   CLINICAL DATA:  Shortness of breath and pleural effusions  EXAM: PORTABLE CHEST - 1 VIEW  COMPARISON:  03/20/2014  FINDINGS: Cardiac shadow is prominent.  Postsurgical changes are now seen. Small pleural effusions are noted bilaterally. Mild atelectatic changes are noted as well.  IMPRESSION: Bibasilar atelectasis and pleural effusions.   Electronically Signed   By: Inez Catalina M.D.   On: 08/22/2014 17:31   Ir Thoracentesis Asp Pleural Space W/img Guide  08/23/2014   INDICATION: Symptomatic bilateral pleural effusions, left greater than right.  EXAM: IR THORACENTESIS ASP PLEURAL SPACE W/IMG GUIDE  COMPARISON:  CXR 08/23/14.  MEDICATIONS: None  COMPLICATIONS: None immediate  TECHNIQUE: Informed written consent was obtained from the patient after a discussion of the risks, benefits and alternatives to treatment. A timeout was performed prior to the initiation of the procedure.  Initial ultrasound scanning demonstrates a left pleural effusion. The lower chest was prepped and draped in the usual sterile fashion. 1% lidocaine was  used for local anesthesia.  An ultrasound image was saved for documentation purposes. An 6 Fr Safe-T-Centesis catheter was introduced. The thoracentesis was performed. The catheter was removed and a dressing was applied. The patient tolerated the procedure well without immediate post procedural complication. The patient was escorted to have an upright chest radiograph.  FINDINGS: A total of approximately 1 liter of bloody fluid was removed.  IMPRESSION: Successful ultrasound-guided left sided thoracentesis yielding 1 liter of pleural fluid.  Read By:  Tsosie Billing PA-C   Electronically Signed   By: Jerilynn Mages.  Shick M.D.   On: 08/23/2014 12:14   Assessment and Plan 1. Acute respiratory failure secondary to bilateral effusions (large) by chest xray.Still SOB and has 2-3 pillow orthopnea.  S/P thoracentesis of 1L of bloody pleural fluid.  This likely is due to bleeding from supra therapeutic INR 2 weeks ago but exam worrisome for reaccumulation of effusion on the left.  Cannot lay flat due to coughing. Plan for transfer to [facility] stepdown today once bed available today.  Repeat chest xray. 2. Acute diastolic CHF: Had small pleural effusion on d/c from [facility] diuretics stopped. 2D echo showed Normal LVF with stable mechanical AVR and small RV with very small pericardial effusion. There were large right and left pleural effusions noted. CVP . Continue lasix 40 iv bid 3. Congenital heart disease with bicuspid AV and ascending aortic aneurysm with prior coarctation repair and persistent left sided SVC and prior sinus venosis ASD repair: s/p mechanical AVR and root replacement and MAZE.Chest CT this admission recurrent coarct at 65mm at maximum point of narrowing - not clear why coarctation was not addressed unless this was part of arch repair. Continue ASA. Warfarin on hold due to bloody pleural effusion.  4. PAF: Amiodarone stopped nausea better. Now back in NSR. Continue BB and hold Cardizem and decrease  lopressor to 12.5mg  BID due to soft BP. Post MAZE and LAALigation. IV Heparin on hold due to bloody pleural effusion.  Dr. Lovena Le recommended checking an amio level prior to restarting propafenone. 5. Chronic systemic anticoagulation with subtherapeutic INR on admission. Warfarin and Heparin on hold due to bloody pleural effusion. 6.  Anemia - Hbg slowly trending back downward - currently at 8.9 7.  DVT prophylaxis with SCDs - patient refused SCDs last night even with nursing counseling.  Stressed the importance preventing DVT with patient and patient agrees to them.  Sueanne Margarita, MD  08/24/2014  8:55 AM

## 2014-08-24 NOTE — Progress Notes (Signed)
Report called to Patience Musca RN at [facility]. Patient to be transported via carelink to [facility] [place]

## 2014-08-31 ENCOUNTER — Ambulatory Visit (INDEPENDENT_AMBULATORY_CARE_PROVIDER_SITE_OTHER): Payer: BLUE CROSS/BLUE SHIELD | Admitting: *Deleted

## 2014-08-31 ENCOUNTER — Telehealth: Payer: Self-pay | Admitting: *Deleted

## 2014-08-31 DIAGNOSIS — Q251 Coarctation of aorta: Secondary | ICD-10-CM

## 2014-08-31 DIAGNOSIS — Z5181 Encounter for therapeutic drug level monitoring: Secondary | ICD-10-CM | POA: Diagnosis not present

## 2014-08-31 DIAGNOSIS — Q231 Congenital insufficiency of aortic valve: Secondary | ICD-10-CM

## 2014-08-31 DIAGNOSIS — I4891 Unspecified atrial fibrillation: Secondary | ICD-10-CM

## 2014-08-31 DIAGNOSIS — Z954 Presence of other heart-valve replacement: Secondary | ICD-10-CM | POA: Diagnosis not present

## 2014-08-31 DIAGNOSIS — Z952 Presence of prosthetic heart valve: Secondary | ICD-10-CM

## 2014-08-31 DIAGNOSIS — I7121 Aneurysm of the ascending aorta, without rupture: Secondary | ICD-10-CM

## 2014-08-31 DIAGNOSIS — I712 Thoracic aortic aneurysm, without rupture: Secondary | ICD-10-CM | POA: Diagnosis not present

## 2014-08-31 LAB — BASIC METABOLIC PANEL
BUN: 12 mg/dL (ref 6–23)
CO2: 32 mEq/L (ref 19–32)
Calcium: 8.9 mg/dL (ref 8.4–10.5)
Chloride: 94 mEq/L — ABNORMAL LOW (ref 96–112)
Creatinine, Ser: 0.88 mg/dL (ref 0.40–1.20)
GFR: 71.59 mL/min (ref >60.00)
Glucose, Bld: 109 mg/dL — ABNORMAL HIGH (ref 70–99)
Potassium: 3.6 mEq/L (ref 3.5–5.1)
Sodium: 135 mEq/L (ref 135–145)

## 2014-08-31 LAB — PROTIME-INR
INR: 7.4 ratio (ref 0.8–1.0)
Prothrombin Time: 77.4 s (ref 9.6–13.1)

## 2014-08-31 LAB — POCT INR: INR: 7.7

## 2014-08-31 NOTE — Telephone Encounter (Signed)
Lab called with critical INR- 7.4; PT 77.4 Will forward to coumadin clinic

## 2014-08-31 NOTE — Telephone Encounter (Signed)
Please see anticoagulant encounter.

## 2014-09-03 ENCOUNTER — Ambulatory Visit (INDEPENDENT_AMBULATORY_CARE_PROVIDER_SITE_OTHER): Payer: BLUE CROSS/BLUE SHIELD | Admitting: *Deleted

## 2014-09-03 DIAGNOSIS — I4891 Unspecified atrial fibrillation: Secondary | ICD-10-CM

## 2014-09-03 DIAGNOSIS — Z5181 Encounter for therapeutic drug level monitoring: Secondary | ICD-10-CM | POA: Diagnosis not present

## 2014-09-03 DIAGNOSIS — Z954 Presence of other heart-valve replacement: Secondary | ICD-10-CM | POA: Diagnosis not present

## 2014-09-03 DIAGNOSIS — I7121 Aneurysm of the ascending aorta, without rupture: Secondary | ICD-10-CM

## 2014-09-03 DIAGNOSIS — I712 Thoracic aortic aneurysm, without rupture: Secondary | ICD-10-CM | POA: Diagnosis not present

## 2014-09-03 DIAGNOSIS — Z952 Presence of prosthetic heart valve: Secondary | ICD-10-CM

## 2014-09-03 DIAGNOSIS — Q251 Coarctation of aorta: Secondary | ICD-10-CM

## 2014-09-03 DIAGNOSIS — Q231 Congenital insufficiency of aortic valve: Secondary | ICD-10-CM

## 2014-09-03 LAB — POCT INR: INR: 2.9

## 2014-09-06 ENCOUNTER — Ambulatory Visit (INDEPENDENT_AMBULATORY_CARE_PROVIDER_SITE_OTHER): Payer: BLUE CROSS/BLUE SHIELD | Admitting: *Deleted

## 2014-09-06 DIAGNOSIS — I712 Thoracic aortic aneurysm, without rupture: Secondary | ICD-10-CM

## 2014-09-06 DIAGNOSIS — Z954 Presence of other heart-valve replacement: Secondary | ICD-10-CM | POA: Diagnosis not present

## 2014-09-06 DIAGNOSIS — Z5181 Encounter for therapeutic drug level monitoring: Secondary | ICD-10-CM | POA: Diagnosis not present

## 2014-09-06 DIAGNOSIS — I4891 Unspecified atrial fibrillation: Secondary | ICD-10-CM | POA: Diagnosis not present

## 2014-09-06 DIAGNOSIS — Z952 Presence of prosthetic heart valve: Secondary | ICD-10-CM

## 2014-09-06 DIAGNOSIS — Q251 Coarctation of aorta: Secondary | ICD-10-CM

## 2014-09-06 DIAGNOSIS — Q231 Congenital insufficiency of aortic valve: Secondary | ICD-10-CM

## 2014-09-06 DIAGNOSIS — I7121 Aneurysm of the ascending aorta, without rupture: Secondary | ICD-10-CM

## 2014-09-06 LAB — POCT INR: INR: 2.1

## 2014-09-09 ENCOUNTER — Encounter: Payer: Self-pay | Admitting: Physician Assistant

## 2014-09-10 ENCOUNTER — Other Ambulatory Visit: Payer: Self-pay | Admitting: *Deleted

## 2014-09-10 MED ORDER — METOPROLOL SUCCINATE ER 25 MG PO TB24: 25.0000 mg | ORAL_TABLET | Freq: Two times a day (BID) | ORAL | Status: DC

## 2014-09-12 ENCOUNTER — Ambulatory Visit (INDEPENDENT_AMBULATORY_CARE_PROVIDER_SITE_OTHER): Payer: BLUE CROSS/BLUE SHIELD | Admitting: *Deleted

## 2014-09-12 DIAGNOSIS — Z5181 Encounter for therapeutic drug level monitoring: Secondary | ICD-10-CM | POA: Diagnosis not present

## 2014-09-12 DIAGNOSIS — I712 Thoracic aortic aneurysm, without rupture: Secondary | ICD-10-CM

## 2014-09-12 DIAGNOSIS — Q231 Congenital insufficiency of aortic valve: Secondary | ICD-10-CM

## 2014-09-12 DIAGNOSIS — Z952 Presence of prosthetic heart valve: Secondary | ICD-10-CM

## 2014-09-12 DIAGNOSIS — Z954 Presence of other heart-valve replacement: Secondary | ICD-10-CM | POA: Diagnosis not present

## 2014-09-12 DIAGNOSIS — I7121 Aneurysm of the ascending aorta, without rupture: Secondary | ICD-10-CM

## 2014-09-12 DIAGNOSIS — I4891 Unspecified atrial fibrillation: Secondary | ICD-10-CM | POA: Diagnosis not present

## 2014-09-12 DIAGNOSIS — Q251 Coarctation of aorta: Secondary | ICD-10-CM

## 2014-09-12 LAB — POCT INR: INR: 1.2

## 2014-09-13 ENCOUNTER — Telehealth: Payer: Self-pay | Admitting: *Deleted

## 2014-09-13 NOTE — Telephone Encounter (Signed)
[name] called stating that she has appointment with Dr. [name] and she wants to know if it is ok To have her coumdin checked on another day.  Please call.

## 2014-09-13 NOTE — Telephone Encounter (Signed)
Called.  INR appt changed to earlier time to accommodate patient.

## 2014-09-15 NOTE — Progress Notes (Signed)
Patient ID: Mallory Sutton, female   DOB: 1961-02-17, 54 y.o.   MRN: [id number] 54 y.o.  complex cardiac issues. Followed since 54. Had coarctation repair at age 54 with left lateral thoracotomy. W/U in past has been limited by lack of insurance. She has insurance now . She has a known bicuspid AV with ascending aortic aneurysm and re-coarctation. She has PAF    Her echo in August showed progression of gradient with Mean 41 mmHg now severe AS. She denies dyspnea or chest pain  Daughter is living at home and studying psychology at [employer]. Son is in 10 th grade at [employer]. Husband still trucking with lots of routes to [place]   Echo August 2014 with progression of AS  Study Conclusions  - Left ventricle: The cavity size was normal. Wall thickness was increased in a pattern of mild LVH. Systolic function was normal. The estimated ejection fraction was in the range of 60% to 65%. - Aortic valve: AV is difficult to see well It is thickened, calcified with restricted motion. Peak and mean gradients through the valve are 61 and41 mm Hg respectively consistent with moderate to severe AS. Mean gradient is increased from echoin 2013 - Aorta: Descending aorta at area of coarc with peak gradient of 34 mm Hg (2.9 cm/sec)  Cardiac CT: 09/16/11  Indication: Morphology Bicuspid AV, Ascending aortic Aneurysm and previous Coarctation Repair.  Comparison Study: 02/26/10 CT  Findings:  Calcium Score: not done but no calcium seen in arteries on axial Images   Coronary Arteries:  LM-normal LAD-normal D1- normal D2-normal  Circumflex- nondominant and normal  OM1- large and normal  RCA: dominant and normal including PDA/ multiple PLA branches  Aortic Valve: Bicuspid with mild calcification. Area by planimetry over 1.5 cm2. By echo the mean gradient across the valve was 52mmHg and peak gradient 51 mmHg.  Aorta: Severe dilatation of the ascending aortic root particularly at the level of the  RPA. Orthogonal measurements are similar to scan done in 2011. 4.5 x 4.4 cm There is no discection, mural hematoma or ulceration. The great vessels arise normally from the arch. Just distal to the left subclavian there is re-coarctation of the aorta. The minimal luminal diameter is 65mm with an area of .9cm2. By echo the mean gradient from the suprasternal notch was 66mmHg and the peak 30 mmHg. By comparison the descending thoracic aorta measures 1.7cm.  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 Normal size and function. 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 Gradient by TTE are mean of 15 mmHg and peak of 30 mmHg.  Had Wheat procedure at [facility] by [name] 07/25/14 involved mechanical AVR with root replacement. No mention of fixing coarcatation  Also had MAZE and LAA exclusion Dyspnic.  Had diuretic stopped due to azotemia. Lots of nausea.  Seen as add on today for marked dyspnea.  She called [name] nurse and took  a lasix yesterday.  She cannot lay flat.  Nausea some better off amiodarone which Dr Lovena Le stopped on 08/13/14.  INR;s have been high due to interaction and only taking qod.  No bleeding issues.    Admitted to [facility] then transferred to [facility] 4/22 for further care after thoracentesis was bloody.  Suspect this was from suprRx INR and amiodarone interaction.  CT with  No anastomotic leaks or surgical issues.    Spoke personally with [name] today on phone  He made a conscious decision not to fix coarctation as he "did not feel it was severe enough.  Post op echo shows normal AVR function and peak velocity across coarctation of 48 mmHg, mean 23 mmHg and peak velocity of 3.5 m/sec  I believe CT/MRI showed MLD of coarctation area only 49mm With doppler showing diastolic gradient     8/36 BUN 36 Cr 1.6  Na 126 K 4.6   Hct 27.3    ROS: Denies fever, malais, weight loss, blurry vision, decreased visual acuity, cough, sputum, SOB, hemoptysis, pleuritic pain, palpitaitons, heartburn, abdominal pain, melena, lower extremity edema, claudication, or rash.  All other systems reviewed and negative  General: Less anxious  Pale white female  HEENT: normal Neck supple with no adenopathy JVP elevated no bruits no thyromegaly Lungs improved effusions with just mild atelectasis at basis   Heart:  O2/H4 click mechanical AVR  murmur, no rub, gallop or click Sternum healing well previous left lateral thoracotomy PMI normal  Coarctation murmur in back  Abdomen: benighn, BS positve, no tenderness, no AAA no bruit.  No HSM or HJR Distal pulses intact with no bruits Plus one bilateral  edema Neuro non-focal Skin warm and dry No muscular weakness   Current Outpatient Prescriptions  Medication Sig Dispense Refill  . aspirin 81 MG chewable tablet Chew 81 mg by mouth daily.    . Coenzyme Q10 (COQ-10 PO) Take 1 tablet by mouth daily.    Marland Kitchen diltiazem (CARDIZEM) 30 MG tablet Take 1 tablet (30 mg total) by mouth every 6 (six) hours.    . furosemide (LASIX) 10 MG/ML injection Inject 4 mLs (40 mg total) into the vein 2 (two) times daily. 4 mL 0  . metoprolol succinate (TOPROL-XL) 25 MG 24 hr tablet Take 1 tablet (25 mg total) by mouth 2 (two) times daily. 180 tablet 3  . Multiple Vitamin (MULTI-VITAMINS) TABS Take by mouth daily.    . ondansetron (ZOFRAN) 4 MG/2ML SOLN injection Inject 2 mLs (4 mg total) into the vein  every 6 (six) hours as needed for nausea. 2 mL 0  . oxyCODONE (OXY IR/ROXICODONE) 5 MG immediate release tablet Take 5-10 mg by mouth every 4 (four) hours as needed for moderate pain. 1-2 TABLETS EVERY 4 HOURS AS NEEDED FOR PAIN    . potassium chloride SA (K-DUR,KLOR-CON) 20 MEQ tablet Take 20 mEq by mouth daily.   1   No current facility-administered medications for this visit.    Allergies  Amiodarone and Penicillins  Electrocardiogram:  5/15  SR low voltage ? Old IMI   08/22/14  SR rate 89  Low voltage  Nonspecific ST changes  Assessment and Plan CHF:  Post surgical pleural effusions improved post thoracentesis x 3.  Blood likely due to high INR while on amiodarone PAF:  Maint NSR on propofenone  Continue anticoagulaion f/u Lisa In AP should be more stable INR off amiodarone AVR:  Normal mechanical valve function by post op echo normal exam Ileus:  Zofran helping with nausea resolved starting to eat better abdomen not distended Coarctation:  Disagree with decision make to not repair coarctation at time of surgery for aneurysm and bicuspid valve BP well controlled for now  F/u echo 6  - year for gradients and MRI for anatomic MLD Aneursym:  In setting of bicuspid valve post Wheat procedure and arch repair with graft.    Will have BMET and CXR in 2 weeks AP F/U coumadin clinic 5/18 Tessalon Pearls for cough with IS  Jenkins Rouge

## 2014-09-17 ENCOUNTER — Ambulatory Visit (INDEPENDENT_AMBULATORY_CARE_PROVIDER_SITE_OTHER): Payer: BLUE CROSS/BLUE SHIELD | Admitting: Cardiovascular Disease

## 2014-09-17 ENCOUNTER — Encounter: Payer: Self-pay | Admitting: Cardiovascular Disease

## 2014-09-17 ENCOUNTER — Ambulatory Visit (INDEPENDENT_AMBULATORY_CARE_PROVIDER_SITE_OTHER): Payer: BLUE CROSS/BLUE SHIELD | Admitting: *Deleted

## 2014-09-17 VITALS — BP 134/78 | HR 83 | Ht 64.0 in | Wt 111.1 lb

## 2014-09-17 DIAGNOSIS — Q251 Coarctation of aorta: Secondary | ICD-10-CM

## 2014-09-17 DIAGNOSIS — R05 Cough: Secondary | ICD-10-CM

## 2014-09-17 DIAGNOSIS — I4891 Unspecified atrial fibrillation: Secondary | ICD-10-CM | POA: Diagnosis not present

## 2014-09-17 DIAGNOSIS — Z5181 Encounter for therapeutic drug level monitoring: Secondary | ICD-10-CM

## 2014-09-17 DIAGNOSIS — I712 Thoracic aortic aneurysm, without rupture: Secondary | ICD-10-CM

## 2014-09-17 DIAGNOSIS — Z79899 Other long term (current) drug therapy: Secondary | ICD-10-CM

## 2014-09-17 DIAGNOSIS — Z954 Presence of other heart-valve replacement: Secondary | ICD-10-CM | POA: Diagnosis not present

## 2014-09-17 DIAGNOSIS — R059 Cough, unspecified: Secondary | ICD-10-CM

## 2014-09-17 DIAGNOSIS — Z952 Presence of prosthetic heart valve: Secondary | ICD-10-CM

## 2014-09-17 DIAGNOSIS — Q231 Congenital insufficiency of aortic valve: Secondary | ICD-10-CM

## 2014-09-17 DIAGNOSIS — I7121 Aneurysm of the ascending aorta, without rupture: Secondary | ICD-10-CM

## 2014-09-17 LAB — POCT INR: INR: 1.2

## 2014-09-17 MED ORDER — BENZONATATE 100 MG PO CAPS: ORAL_CAPSULE | ORAL | Status: DC

## 2014-09-17 NOTE — Patient Instructions (Signed)
Medication Instructions:  TESSALON 1-2  TABS HS  PRN COUGH  Labwork: BMET  IN  2  WEEKS  [place]  Testing/Procedures: CXR 2 WEEKS  [place]  Follow-Up: Your physician recommends that you schedule a follow-up appointment in: NEXT  AVAILABLE WITH  DR [name]  Any Other Special Instructions Will Be Listed Below (If Applicable).

## 2014-09-19 ENCOUNTER — Ambulatory Visit (INDEPENDENT_AMBULATORY_CARE_PROVIDER_SITE_OTHER): Payer: BLUE CROSS/BLUE SHIELD | Admitting: *Deleted

## 2014-09-19 DIAGNOSIS — I712 Thoracic aortic aneurysm, without rupture: Secondary | ICD-10-CM | POA: Diagnosis not present

## 2014-09-19 DIAGNOSIS — Z5181 Encounter for therapeutic drug level monitoring: Secondary | ICD-10-CM

## 2014-09-19 DIAGNOSIS — I4891 Unspecified atrial fibrillation: Secondary | ICD-10-CM | POA: Diagnosis not present

## 2014-09-19 DIAGNOSIS — Z954 Presence of other heart-valve replacement: Secondary | ICD-10-CM | POA: Diagnosis not present

## 2014-09-19 DIAGNOSIS — Q251 Coarctation of aorta: Secondary | ICD-10-CM

## 2014-09-19 DIAGNOSIS — I7121 Aneurysm of the ascending aorta, without rupture: Secondary | ICD-10-CM

## 2014-09-19 DIAGNOSIS — Z952 Presence of prosthetic heart valve: Secondary | ICD-10-CM

## 2014-09-19 DIAGNOSIS — Q231 Congenital insufficiency of aortic valve: Secondary | ICD-10-CM

## 2014-09-19 LAB — POCT INR: INR: 1.5

## 2014-09-24 ENCOUNTER — Telehealth: Payer: Self-pay | Admitting: *Deleted

## 2014-09-24 ENCOUNTER — Ambulatory Visit (INDEPENDENT_AMBULATORY_CARE_PROVIDER_SITE_OTHER): Payer: BLUE CROSS/BLUE SHIELD | Admitting: *Deleted

## 2014-09-24 DIAGNOSIS — Z954 Presence of other heart-valve replacement: Secondary | ICD-10-CM

## 2014-09-24 DIAGNOSIS — I7121 Aneurysm of the ascending aorta, without rupture: Secondary | ICD-10-CM

## 2014-09-24 DIAGNOSIS — I712 Thoracic aortic aneurysm, without rupture: Secondary | ICD-10-CM | POA: Diagnosis not present

## 2014-09-24 DIAGNOSIS — Z5181 Encounter for therapeutic drug level monitoring: Secondary | ICD-10-CM | POA: Diagnosis not present

## 2014-09-24 DIAGNOSIS — Q231 Congenital insufficiency of aortic valve: Secondary | ICD-10-CM

## 2014-09-24 DIAGNOSIS — I4891 Unspecified atrial fibrillation: Secondary | ICD-10-CM | POA: Diagnosis not present

## 2014-09-24 DIAGNOSIS — Z952 Presence of prosthetic heart valve: Secondary | ICD-10-CM

## 2014-09-24 DIAGNOSIS — Q251 Coarctation of aorta: Secondary | ICD-10-CM

## 2014-09-24 LAB — POCT INR: INR: 2.1

## 2014-09-24 NOTE — Telephone Encounter (Signed)
PT AWARE OF THE  NEED TO HAVE  BMET  AND  CXR DONE IN  2  WEEKS ORDERS MAILED  TO   PT  .  PT WILL HAVE  DONE   AT [facility]

## 2014-10-04 ENCOUNTER — Ambulatory Visit: Payer: BLUE CROSS/BLUE SHIELD | Admitting: Physician Assistant

## 2014-10-04 ENCOUNTER — Ambulatory Visit (INDEPENDENT_AMBULATORY_CARE_PROVIDER_SITE_OTHER): Payer: BLUE CROSS/BLUE SHIELD | Admitting: *Deleted

## 2014-10-04 DIAGNOSIS — Z5181 Encounter for therapeutic drug level monitoring: Secondary | ICD-10-CM

## 2014-10-04 DIAGNOSIS — Z954 Presence of other heart-valve replacement: Secondary | ICD-10-CM

## 2014-10-04 DIAGNOSIS — I7121 Aneurysm of the ascending aorta, without rupture: Secondary | ICD-10-CM

## 2014-10-04 DIAGNOSIS — Z952 Presence of prosthetic heart valve: Secondary | ICD-10-CM

## 2014-10-04 DIAGNOSIS — I712 Thoracic aortic aneurysm, without rupture: Secondary | ICD-10-CM

## 2014-10-04 DIAGNOSIS — Q231 Congenital insufficiency of aortic valve: Secondary | ICD-10-CM

## 2014-10-04 DIAGNOSIS — Q251 Coarctation of aorta: Secondary | ICD-10-CM

## 2014-10-04 DIAGNOSIS — I4891 Unspecified atrial fibrillation: Secondary | ICD-10-CM | POA: Diagnosis not present

## 2014-10-04 LAB — POCT INR: INR: 2.9

## 2014-10-04 MED ORDER — WARFARIN SODIUM 1 MG PO TABS: ORAL_TABLET | ORAL | Status: DC

## 2014-10-08 ENCOUNTER — Ambulatory Visit (HOSPITAL_COMMUNITY)
Admission: RE | Admit: 2014-10-08 | Discharge: 2014-10-08 | Disposition: A | Discharge status: Home / Self Care | Payer: BLUE CROSS/BLUE SHIELD | Source: Ambulatory Visit | Attending: Cardiovascular Disease | Admitting: Cardiovascular Disease

## 2014-10-08 ENCOUNTER — Other Ambulatory Visit (HOSPITAL_COMMUNITY)
Admission: RE | Admit: 2014-10-08 | Discharge: 2014-10-08 | Disposition: A | Discharge status: Home / Self Care | Payer: BLUE CROSS/BLUE SHIELD | Source: Ambulatory Visit | Attending: Cardiovascular Disease | Admitting: Cardiovascular Disease

## 2014-10-08 ENCOUNTER — Other Ambulatory Visit: Payer: Self-pay | Admitting: *Deleted

## 2014-10-08 DIAGNOSIS — J9811 Atelectasis: Secondary | ICD-10-CM | POA: Insufficient documentation

## 2014-10-08 DIAGNOSIS — J90 Pleural effusion, not elsewhere classified: Secondary | ICD-10-CM

## 2014-10-08 DIAGNOSIS — Z79899 Other long term (current) drug therapy: Secondary | ICD-10-CM | POA: Diagnosis present

## 2014-10-08 DIAGNOSIS — R059 Cough, unspecified: Secondary | ICD-10-CM

## 2014-10-08 DIAGNOSIS — R05 Cough: Secondary | ICD-10-CM

## 2014-10-08 LAB — BASIC METABOLIC PANEL
Anion gap: 9 (ref 5–15)
CALCIUM: 8.8 mg/dL — AB (ref 8.9–10.3)
CO2: 30 mmol/L (ref 22–32)
Chloride: 100 mmol/L — ABNORMAL LOW (ref 101–111)
Creatinine, Ser: 0.86 mg/dL (ref 0.44–1.00)
GFR calc non Af Amer: >60 mL/min (ref >60)
Glucose, Bld: 101 mg/dL — ABNORMAL HIGH (ref 65–99)
POTASSIUM: 3.6 mmol/L (ref 3.5–5.1)
Sodium: 139 mmol/L (ref 135–145)

## 2014-10-11 ENCOUNTER — Encounter (HOSPITAL_COMMUNITY): Payer: BLUE CROSS/BLUE SHIELD

## 2014-10-16 ENCOUNTER — Encounter (HOSPITAL_COMMUNITY): Payer: BLUE CROSS/BLUE SHIELD

## 2014-10-18 ENCOUNTER — Encounter (HOSPITAL_COMMUNITY): Payer: BLUE CROSS/BLUE SHIELD

## 2014-10-22 ENCOUNTER — Ambulatory Visit (INDEPENDENT_AMBULATORY_CARE_PROVIDER_SITE_OTHER): Payer: BLUE CROSS/BLUE SHIELD | Admitting: *Deleted

## 2014-10-22 DIAGNOSIS — Q231 Congenital insufficiency of aortic valve: Secondary | ICD-10-CM

## 2014-10-22 DIAGNOSIS — Z954 Presence of other heart-valve replacement: Secondary | ICD-10-CM

## 2014-10-22 DIAGNOSIS — I7121 Aneurysm of the ascending aorta, without rupture: Secondary | ICD-10-CM

## 2014-10-22 DIAGNOSIS — Q251 Coarctation of aorta: Secondary | ICD-10-CM

## 2014-10-22 DIAGNOSIS — I712 Thoracic aortic aneurysm, without rupture: Secondary | ICD-10-CM

## 2014-10-22 DIAGNOSIS — I4891 Unspecified atrial fibrillation: Secondary | ICD-10-CM

## 2014-10-22 DIAGNOSIS — Z5181 Encounter for therapeutic drug level monitoring: Secondary | ICD-10-CM

## 2014-10-22 DIAGNOSIS — Z952 Presence of prosthetic heart valve: Secondary | ICD-10-CM

## 2014-10-22 LAB — POCT INR: INR: 1.7

## 2014-10-31 ENCOUNTER — Ambulatory Visit (INDEPENDENT_AMBULATORY_CARE_PROVIDER_SITE_OTHER): Payer: BLUE CROSS/BLUE SHIELD | Admitting: *Deleted

## 2014-10-31 DIAGNOSIS — Z952 Presence of prosthetic heart valve: Secondary | ICD-10-CM

## 2014-10-31 DIAGNOSIS — Q251 Coarctation of aorta: Secondary | ICD-10-CM

## 2014-10-31 DIAGNOSIS — Z5181 Encounter for therapeutic drug level monitoring: Secondary | ICD-10-CM | POA: Diagnosis not present

## 2014-10-31 DIAGNOSIS — I712 Thoracic aortic aneurysm, without rupture: Secondary | ICD-10-CM

## 2014-10-31 DIAGNOSIS — Z954 Presence of other heart-valve replacement: Secondary | ICD-10-CM | POA: Diagnosis not present

## 2014-10-31 DIAGNOSIS — I4891 Unspecified atrial fibrillation: Secondary | ICD-10-CM

## 2014-10-31 DIAGNOSIS — I7121 Aneurysm of the ascending aorta, without rupture: Secondary | ICD-10-CM

## 2014-10-31 DIAGNOSIS — Q231 Congenital insufficiency of aortic valve: Secondary | ICD-10-CM

## 2014-10-31 LAB — POCT INR: INR: 2

## 2014-11-07 ENCOUNTER — Encounter: Payer: Self-pay | Admitting: Cardiovascular Disease

## 2014-11-08 ENCOUNTER — Other Ambulatory Visit: Payer: Self-pay

## 2014-11-08 ENCOUNTER — Telehealth: Payer: Self-pay

## 2014-11-08 DIAGNOSIS — J90 Pleural effusion, not elsewhere classified: Secondary | ICD-10-CM

## 2014-11-08 NOTE — Telephone Encounter (Signed)
LM telling pt I had ordered her cxr for AP and i just needed Dr.Nisha to e-sign the order

## 2014-11-08 NOTE — Progress Notes (Unsigned)
Please e-sign order for cxr

## 2014-11-09 ENCOUNTER — Other Ambulatory Visit: Payer: Self-pay | Admitting: *Deleted

## 2014-11-12 ENCOUNTER — Ambulatory Visit (HOSPITAL_COMMUNITY)
Admission: RE | Admit: 2014-11-12 | Discharge: 2014-11-12 | Disposition: A | Discharge status: Home / Self Care | Payer: BLUE CROSS/BLUE SHIELD | Source: Ambulatory Visit | Attending: Cardiovascular Disease | Admitting: Cardiovascular Disease

## 2014-11-12 DIAGNOSIS — J449 Chronic obstructive pulmonary disease, unspecified: Secondary | ICD-10-CM | POA: Insufficient documentation

## 2014-11-12 DIAGNOSIS — J90 Pleural effusion, not elsewhere classified: Secondary | ICD-10-CM | POA: Insufficient documentation

## 2014-11-13 ENCOUNTER — Encounter (HOSPITAL_COMMUNITY)
Admission: RE | Admit: 2014-11-13 | Discharge: 2014-11-13 | Disposition: A | Discharge status: Home / Self Care | Payer: BLUE CROSS/BLUE SHIELD | Source: Ambulatory Visit | Attending: Cardiovascular Disease | Admitting: Cardiovascular Disease

## 2014-11-13 ENCOUNTER — Encounter (HOSPITAL_COMMUNITY): Payer: Self-pay

## 2014-11-13 DIAGNOSIS — Z952 Presence of prosthetic heart valve: Secondary | ICD-10-CM | POA: Insufficient documentation

## 2014-11-13 HISTORY — DX: Cardiac murmur, unspecified: R01.1

## 2014-11-13 HISTORY — DX: Heart failure, unspecified: I50.9

## 2014-11-13 NOTE — Patient Instructions (Signed)
.  crp

## 2014-11-13 NOTE — Progress Notes (Signed)
Patient ID: Mallory Sutton, female   DOB: 04/12/1961, 54 y.o.   MRN: [id number] 54 y.o.  complex cardiac issues. Followed since 99. Had coarctation repair at age 54 with left lateral thoracotomy. W/U in past has been limited by lack of insurance. She has insurance now . She has a known bicuspid AV with ascending aortic aneurysm and re-coarctation. She has PAF    Her echo in August showed progression of gradient with Mean 41 mmHg now severe AS. She denies dyspnea or chest pain  Daughter is living at home and studying psychology at [employer]. Son is in 10 th grade at [employer]. Husband still trucking with lots of routes to [place]   Echo August 2014 with progression of AS  Study Conclusions  - Left ventricle: The cavity size was normal. Wall thickness was increased in a pattern of mild LVH. Systolic function was normal. The estimated ejection fraction was in the range of 60% to 65%. - Aortic valve: AV is difficult to see well It is thickened, calcified with restricted motion. Peak and mean gradients through the valve are 61 and41 mm Hg respectively consistent with moderate to severe AS. Mean gradient is increased from echoin 2013 - Aorta: Descending aorta at area of coarc with peak gradient of 34 mm Hg (2.9 cm/sec)  Cardiac CT: 09/16/11  Indication: Morphology Bicuspid AV, Ascending aortic Aneurysm and previous Coarctation Repair.  Comparison Study: 02/26/10 CT  Findings:  Calcium Score: not done but no calcium seen in arteries on axial Images   Coronary Arteries:  LM-normal LAD-normal D1- normal D2-normal  Circumflex- nondominant and normal  OM1- large and normal  RCA: dominant and normal including PDA/ multiple PLA branches  Aortic Valve: Bicuspid with mild calcification. Area by planimetry over 1.5 cm2. By echo the mean gradient across the valve was 66mmHg and peak gradient 51 mmHg.  Aorta: Severe dilatation of the ascending aortic root particularly at the level of the  RPA. Orthogonal measurements are similar to scan done in 2011. 4.5 x 4.4 cm There is no discection, mural hematoma or ulceration. The great vessels arise normally from the arch. Just distal to the left subclavian there is re-coarctation of the aorta. The minimal luminal diameter is 91mm with an area of .9cm2. By echo the mean gradient from the suprasternal notch was 61mmHg and the peak 30 mmHg. By comparison the descending thoracic aorta measures 1.7cm.  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 Normal size and function. 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 Gradient by TTE are mean of 15 mmHg and peak of 30 mmHg.  Had Wheat procedure at [facility] by [name] 07/25/14 involved mechanical AVR with root replacement. No mention of fixing coarcatation  Also had MAZE and LAA exclusion Dyspnic.  Had diuretic stopped due to azotemia. Lots of nausea.  Seen as add on today for marked dyspnea.  She called [name] nurse and  took a lasix yesterday.  She cannot lay flat.  Nausea some better off amiodarone which Dr Lovena Le stopped on 08/13/14.  INR;s have been high due to interaction and only taking qod.  No bleeding issues.    Admitted to [facility] then transferred to [facility] 4/22 for further care after thoracentesis was bloody.  Suspect this was from suprRx INR and amiodarone interaction.  CT with  No anastomotic leaks or surgical issues.    Spoke personally with [name] today on phone  He made a conscious decision not to fix coarctation as he "did not feel it was severe enough.  Post op echo shows normal AVR function and peak velocity across coarctation of 53 mmHg, mean 23 mmHg and peak velocity of 3.5 m/sec  I believe CT/MRI showed MLD of coarctation area only 40mm With doppler showing diastolic gradient    Lab Results  Component Value Date   HCT 29.5* 08/24/2014     CXR done this week with marked improvement in effusions reviewed   ROS: Denies fever, malais, weight loss, blurry vision, decreased visual acuity, cough, sputum, SOB, hemoptysis, pleuritic pain, palpitaitons, heartburn, abdominal pain, melena, lower extremity edema, claudication, or rash.  All other systems reviewed and negative  General: Less anxious  Pale white female  HEENT: normal Neck supple with no adenopathy JVP elevated no bruits no thyromegaly Lungs improved effusions with just mild atelectasis at basis   Heart:  P6/P9 click mechanical AVR  murmur, no rub, gallop or click Sternum healing well previous left lateral thoracotomy PMI normal  Coarctation murmur in back  Abdomen: benighn, BS positve, no tenderness, no AAA no bruit.  No HSM or HJR Distal pulses intact with no bruits Plus one bilateral  edema Neuro non-focal Skin warm and dry No muscular weakness   Current Outpatient Prescriptions  Medication Sig Dispense Refill  . aspirin EC 81 MG tablet Take 81 mg by mouth daily.    . benzonatate (TESSALON) 100 MG capsule Take 100 mg by mouth at bedtime as needed for cough (100-200 mg as needed for cough).    . Coenzyme Q10 (COQ-10 PO) Take 1 tablet by mouth daily.    . furosemide (LASIX) 40 MG tablet Take 40 mg by mouth 2 (two) times daily.    . metoprolol succinate (TOPROL-XL) 25 MG 24 hr tablet Take 1 tablet (25 mg total) by mouth 2 (two) times daily. 180 tablet 3  . Multiple Vitamin (MULTI-VITAMINS) TABS Take by mouth daily.    .  potassium chloride SA (K-DUR,KLOR-CON) 20 MEQ tablet Take 20 mEq by mouth 2 (two) times daily.   1  . propafenone (RYTHMOL) 225 MG tablet Take 225 mg by mouth 3 (three) times daily.    Marland Kitchen trolamine salicylate (ASPERCREME) 10 % cream Apply 1 application topically as needed for muscle pain (Back pain).    Marland Kitchen warfarin (COUMADIN) 1 MG tablet Take 1 mg by mouth daily. Pt takes 2 tablets by mouth daily except 1 tablet on Tues, Thurs and Sat.     No current facility-administered medications for this visit.    Allergies  Amiodarone and Penicillins  Electrocardiogram:  5/15  SR low voltage ? Old IMI   08/22/14  SR rate 89  Low voltage  Nonspecific ST changes  Assessment and Plan CHF:  Post surgical pleural effusions improved post thoracentesis x 3.  Blood likely due to high INR while on amiodarone Decrease lasix to 20 mg and K to 10  PAF:  Maint NSR on  propofenone  Continue anticoagulaion f/u Lisa In [facility] should be more stable INR off amiodarone AVR:  Normal mechanical valve function by post op echo normal exam Ileus:  Zofran helping with nausea resolved starting to eat better abdomen not distended Coarctation:  Disagree with decision make to not repair coarctation at time of surgery for aneurysm and bicuspid valve BP well controlled for now  F/u echo 1 year for gradients and MRI for anatomic MLD Aneursym:  In setting of bicuspid valve post Wheat procedure and arch repair with graft.    F/U with me in 3 months with BMET  Jenkins Rouge

## 2014-11-13 NOTE — Progress Notes (Signed)
Cardiac/Pulmonary Rehab Medication Review by a Pharmacist  Does the patient  feel that his/her medications are working for him/her?  yes  Has the patient been experiencing any side effects to the medications prescribed?  no  Does the patient measure his/her own blood pressure or blood glucose at home?  yes (sometimes)  Does the patient have any problems obtaining medications due to transportation or finances?   no  Understanding of regimen: excellent Understanding of indications: excellent Potential of compliance: excellent  Pharmacist comments: Good understanding of regimen and has method to ensure compliance.  Pricilla Larsson 11/13/2014 3:35 PM

## 2014-11-13 NOTE — Progress Notes (Signed)
Patient arrived at 1430.  Patient referred to Cardiac Rehab by Dr. [name] due to Atrial Valve Replacement .  Dr. [name] is her cardiologist and Dr. Gerarda Fraction is her PCP.  During orientation advised patient on arrival and appointment times what to wear, what to do before, during and after exercise.  Reviewed attendance and class policy.  Talked about inclement weather and class consultation policy. Patient is scheduled to start cardiac Rehab on November 19, 2014 at 1100.  Patient was advised to come to class 5 minutes before class starts.  She was also given instructions on meeting with the dietician and attending the Family Structure classes. Pt is eager to get started.  Patient finished pre walk test. Education and orientation ended at 1600.

## 2014-11-14 ENCOUNTER — Encounter: Payer: Self-pay | Admitting: Cardiovascular Disease

## 2014-11-14 ENCOUNTER — Ambulatory Visit (INDEPENDENT_AMBULATORY_CARE_PROVIDER_SITE_OTHER): Payer: BLUE CROSS/BLUE SHIELD | Admitting: Cardiovascular Disease

## 2014-11-14 VITALS — BP 110/60 | HR 68 | Ht 64.0 in | Wt 114.6 lb

## 2014-11-14 DIAGNOSIS — Z954 Presence of other heart-valve replacement: Secondary | ICD-10-CM | POA: Diagnosis not present

## 2014-11-14 DIAGNOSIS — Z952 Presence of prosthetic heart valve: Secondary | ICD-10-CM

## 2014-11-14 NOTE — Patient Instructions (Signed)
Medication Instructions:  FUROSEMIDE  20 MG  EVERY  DAY  KCL 10 MEQ  1 EVERY DAY  Labwork: BMET  IN   3 MONTHS   Testing/Procedures: NONE  Follow-Up: Your physician recommends that you schedule a follow-up appointment in: 3 MONTHS WITH  DR [place]  BMET  SAME DAY Any Other Special Instructions Will Be Listed Below (If Applicable).

## 2014-11-19 ENCOUNTER — Ambulatory Visit (INDEPENDENT_AMBULATORY_CARE_PROVIDER_SITE_OTHER): Payer: BLUE CROSS/BLUE SHIELD | Admitting: *Deleted

## 2014-11-19 ENCOUNTER — Encounter (HOSPITAL_COMMUNITY)
Admission: RE | Admit: 2014-11-19 | Discharge: 2014-11-19 | Disposition: A | Discharge status: Home / Self Care | Payer: BLUE CROSS/BLUE SHIELD | Source: Ambulatory Visit | Attending: Cardiovascular Disease | Admitting: Cardiovascular Disease

## 2014-11-19 DIAGNOSIS — Z952 Presence of prosthetic heart valve: Secondary | ICD-10-CM | POA: Diagnosis present

## 2014-11-19 DIAGNOSIS — I712 Thoracic aortic aneurysm, without rupture: Secondary | ICD-10-CM | POA: Diagnosis not present

## 2014-11-19 DIAGNOSIS — Q251 Coarctation of aorta: Secondary | ICD-10-CM

## 2014-11-19 DIAGNOSIS — Z5181 Encounter for therapeutic drug level monitoring: Secondary | ICD-10-CM

## 2014-11-19 DIAGNOSIS — I4891 Unspecified atrial fibrillation: Secondary | ICD-10-CM

## 2014-11-19 DIAGNOSIS — Z954 Presence of other heart-valve replacement: Secondary | ICD-10-CM

## 2014-11-19 DIAGNOSIS — Q231 Congenital insufficiency of aortic valve: Secondary | ICD-10-CM

## 2014-11-19 DIAGNOSIS — I7121 Aneurysm of the ascending aorta, without rupture: Secondary | ICD-10-CM

## 2014-11-19 LAB — POCT INR: INR: 2.5

## 2014-11-21 ENCOUNTER — Encounter (HOSPITAL_COMMUNITY)
Admission: RE | Admit: 2014-11-21 | Discharge: 2014-11-21 | Disposition: A | Discharge status: Home / Self Care | Payer: BLUE CROSS/BLUE SHIELD | Source: Ambulatory Visit | Attending: Cardiovascular Disease | Admitting: Cardiovascular Disease

## 2014-11-21 DIAGNOSIS — Z952 Presence of prosthetic heart valve: Secondary | ICD-10-CM | POA: Diagnosis not present

## 2014-11-21 NOTE — Progress Notes (Signed)
Cardiac Rehabilitation Program Outcomes Report   Orientation:  11/13/14 Graduate Date:  tbd Discharge Date:  tbd # of sessions completed: 3  Cardiologist: [name] Family MD:  Gerarda Fraction Class Time:  1100  A.  Exercise Program:  Tolerates exercise @ 1.60 METS for 15 minutes and Walk Test Results:  Pre: 1.94 mets  B.  Mental Health:  Good mental attitude  C.  Education/Instruction/Skills  Accurately checks own pulse.  Rest:  69  Exercise:  83  Uses Perceived Exertion Scale and/or Dyspnea Scale  D.  Nutrition/Weight Control/Body Composition:  Adherence to prescribed nutrition program: good    E.  Blood Lipids   No results found for: CHOL, HDL, LDLCALC, LDLDIRECT, TRIG, CHOLHDL  F.  Lifestyle Changes:  Making positive lifestyle changes  G.  Symptoms noted with exercise:  Asymptomatic  Report Completed By:  Stevphen Rochester RN   Comments:  This is patients first week progress note for [facility].

## 2014-11-23 ENCOUNTER — Encounter (HOSPITAL_COMMUNITY)
Admission: RE | Admit: 2014-11-23 | Discharge: 2014-11-23 | Disposition: A | Discharge status: Home / Self Care | Payer: BLUE CROSS/BLUE SHIELD | Source: Ambulatory Visit | Attending: Cardiovascular Disease | Admitting: Cardiovascular Disease

## 2014-11-23 DIAGNOSIS — Z952 Presence of prosthetic heart valve: Secondary | ICD-10-CM | POA: Diagnosis not present

## 2014-11-26 ENCOUNTER — Encounter (HOSPITAL_COMMUNITY)
Admission: RE | Admit: 2014-11-26 | Discharge: 2014-11-26 | Disposition: A | Discharge status: Home / Self Care | Payer: BLUE CROSS/BLUE SHIELD | Source: Ambulatory Visit | Attending: Cardiovascular Disease | Admitting: Cardiovascular Disease

## 2014-11-26 DIAGNOSIS — Z952 Presence of prosthetic heart valve: Secondary | ICD-10-CM | POA: Diagnosis not present

## 2014-11-28 ENCOUNTER — Encounter (HOSPITAL_COMMUNITY)
Admission: RE | Admit: 2014-11-28 | Discharge: 2014-11-28 | Disposition: A | Discharge status: Home / Self Care | Payer: BLUE CROSS/BLUE SHIELD | Source: Ambulatory Visit | Attending: Cardiovascular Disease | Admitting: Cardiovascular Disease

## 2014-11-28 DIAGNOSIS — Z952 Presence of prosthetic heart valve: Secondary | ICD-10-CM | POA: Diagnosis not present

## 2014-11-30 ENCOUNTER — Encounter (HOSPITAL_COMMUNITY)
Admission: RE | Admit: 2014-11-30 | Discharge: 2014-11-30 | Disposition: A | Discharge status: Home / Self Care | Payer: BLUE CROSS/BLUE SHIELD | Source: Ambulatory Visit | Attending: Cardiovascular Disease | Admitting: Cardiovascular Disease

## 2014-11-30 DIAGNOSIS — Z952 Presence of prosthetic heart valve: Secondary | ICD-10-CM | POA: Diagnosis not present

## 2014-12-03 ENCOUNTER — Encounter (HOSPITAL_COMMUNITY)
Admission: RE | Admit: 2014-12-03 | Discharge: 2014-12-03 | Disposition: A | Discharge status: Home / Self Care | Payer: BLUE CROSS/BLUE SHIELD | Source: Ambulatory Visit | Attending: Cardiovascular Disease | Admitting: Cardiovascular Disease

## 2014-12-03 DIAGNOSIS — Z952 Presence of prosthetic heart valve: Secondary | ICD-10-CM | POA: Diagnosis present

## 2014-12-05 ENCOUNTER — Encounter (HOSPITAL_COMMUNITY)
Admission: RE | Admit: 2014-12-05 | Discharge: 2014-12-05 | Disposition: A | Discharge status: Home / Self Care | Payer: BLUE CROSS/BLUE SHIELD | Source: Ambulatory Visit | Attending: Cardiovascular Disease | Admitting: Cardiovascular Disease

## 2014-12-05 DIAGNOSIS — Z952 Presence of prosthetic heart valve: Secondary | ICD-10-CM | POA: Diagnosis not present

## 2014-12-07 ENCOUNTER — Encounter (HOSPITAL_COMMUNITY)
Admission: RE | Admit: 2014-12-07 | Discharge: 2014-12-07 | Disposition: A | Discharge status: Home / Self Care | Payer: BLUE CROSS/BLUE SHIELD | Source: Ambulatory Visit | Attending: Cardiovascular Disease | Admitting: Cardiovascular Disease

## 2014-12-07 DIAGNOSIS — Z952 Presence of prosthetic heart valve: Secondary | ICD-10-CM | POA: Diagnosis not present

## 2014-12-07 NOTE — Progress Notes (Signed)
Patient was given individual home exercise plan today, 12/07/2014. Handout was reviewed and discussed. Patient verbalized an understanding.

## 2014-12-10 ENCOUNTER — Encounter (HOSPITAL_COMMUNITY)
Admission: RE | Admit: 2014-12-10 | Discharge: 2014-12-10 | Disposition: A | Discharge status: Home / Self Care | Payer: BLUE CROSS/BLUE SHIELD | Source: Ambulatory Visit | Attending: Cardiovascular Disease | Admitting: Cardiovascular Disease

## 2014-12-10 ENCOUNTER — Ambulatory Visit (INDEPENDENT_AMBULATORY_CARE_PROVIDER_SITE_OTHER): Payer: BLUE CROSS/BLUE SHIELD | Admitting: *Deleted

## 2014-12-10 DIAGNOSIS — Z5181 Encounter for therapeutic drug level monitoring: Secondary | ICD-10-CM | POA: Diagnosis not present

## 2014-12-10 DIAGNOSIS — Z952 Presence of prosthetic heart valve: Secondary | ICD-10-CM

## 2014-12-10 DIAGNOSIS — I712 Thoracic aortic aneurysm, without rupture: Secondary | ICD-10-CM

## 2014-12-10 DIAGNOSIS — Q231 Congenital insufficiency of aortic valve: Secondary | ICD-10-CM

## 2014-12-10 DIAGNOSIS — Z954 Presence of other heart-valve replacement: Secondary | ICD-10-CM | POA: Diagnosis not present

## 2014-12-10 DIAGNOSIS — I7121 Aneurysm of the ascending aorta, without rupture: Secondary | ICD-10-CM

## 2014-12-10 DIAGNOSIS — Q251 Coarctation of aorta: Secondary | ICD-10-CM

## 2014-12-10 DIAGNOSIS — I4891 Unspecified atrial fibrillation: Secondary | ICD-10-CM | POA: Diagnosis not present

## 2014-12-10 LAB — POCT INR: INR: 1.8

## 2014-12-10 MED ORDER — WARFARIN SODIUM 1 MG PO TABS: ORAL_TABLET | ORAL | Status: DC

## 2014-12-12 ENCOUNTER — Encounter (HOSPITAL_COMMUNITY)
Admission: RE | Admit: 2014-12-12 | Discharge: 2014-12-12 | Disposition: A | Discharge status: Home / Self Care | Payer: BLUE CROSS/BLUE SHIELD | Source: Ambulatory Visit | Attending: Cardiovascular Disease | Admitting: Cardiovascular Disease

## 2014-12-12 DIAGNOSIS — Z952 Presence of prosthetic heart valve: Secondary | ICD-10-CM | POA: Diagnosis not present

## 2014-12-14 ENCOUNTER — Encounter (HOSPITAL_COMMUNITY)
Admission: RE | Admit: 2014-12-14 | Discharge: 2014-12-14 | Disposition: A | Discharge status: Home / Self Care | Payer: BLUE CROSS/BLUE SHIELD | Source: Ambulatory Visit | Attending: Cardiovascular Disease | Admitting: Cardiovascular Disease

## 2014-12-14 DIAGNOSIS — Z952 Presence of prosthetic heart valve: Secondary | ICD-10-CM | POA: Diagnosis not present

## 2014-12-17 ENCOUNTER — Encounter (HOSPITAL_COMMUNITY)
Admission: RE | Admit: 2014-12-17 | Discharge: 2014-12-17 | Disposition: A | Discharge status: Home / Self Care | Payer: BLUE CROSS/BLUE SHIELD | Source: Ambulatory Visit | Attending: Cardiovascular Disease | Admitting: Cardiovascular Disease

## 2014-12-17 DIAGNOSIS — Z952 Presence of prosthetic heart valve: Secondary | ICD-10-CM | POA: Diagnosis not present

## 2014-12-19 ENCOUNTER — Encounter (HOSPITAL_COMMUNITY)
Admission: RE | Admit: 2014-12-19 | Discharge: 2014-12-19 | Disposition: A | Discharge status: Home / Self Care | Payer: BLUE CROSS/BLUE SHIELD | Source: Ambulatory Visit | Attending: Cardiovascular Disease | Admitting: Cardiovascular Disease

## 2014-12-19 DIAGNOSIS — Z952 Presence of prosthetic heart valve: Secondary | ICD-10-CM | POA: Diagnosis not present

## 2014-12-21 ENCOUNTER — Encounter (HOSPITAL_COMMUNITY)
Admission: RE | Admit: 2014-12-21 | Discharge: 2014-12-21 | Disposition: A | Discharge status: Home / Self Care | Payer: BLUE CROSS/BLUE SHIELD | Source: Ambulatory Visit | Attending: Cardiovascular Disease | Admitting: Cardiovascular Disease

## 2014-12-21 DIAGNOSIS — Z952 Presence of prosthetic heart valve: Secondary | ICD-10-CM | POA: Diagnosis not present

## 2014-12-24 ENCOUNTER — Encounter (HOSPITAL_COMMUNITY)
Admission: RE | Admit: 2014-12-24 | Discharge: 2014-12-24 | Disposition: A | Discharge status: Home / Self Care | Payer: BLUE CROSS/BLUE SHIELD | Source: Ambulatory Visit | Attending: Cardiovascular Disease | Admitting: Cardiovascular Disease

## 2014-12-24 DIAGNOSIS — Z952 Presence of prosthetic heart valve: Secondary | ICD-10-CM | POA: Diagnosis not present

## 2014-12-26 ENCOUNTER — Encounter (HOSPITAL_COMMUNITY)
Admission: RE | Admit: 2014-12-26 | Discharge: 2014-12-26 | Disposition: A | Discharge status: Home / Self Care | Payer: BLUE CROSS/BLUE SHIELD | Source: Ambulatory Visit | Attending: Cardiovascular Disease | Admitting: Cardiovascular Disease

## 2014-12-28 ENCOUNTER — Encounter (HOSPITAL_COMMUNITY): Payer: BLUE CROSS/BLUE SHIELD

## 2014-12-31 ENCOUNTER — Encounter (HOSPITAL_COMMUNITY): Payer: BLUE CROSS/BLUE SHIELD

## 2015-01-02 ENCOUNTER — Other Ambulatory Visit: Payer: Self-pay | Admitting: *Deleted

## 2015-01-02 ENCOUNTER — Encounter (HOSPITAL_COMMUNITY): Payer: BLUE CROSS/BLUE SHIELD

## 2015-01-02 DIAGNOSIS — Z0000 Encounter for general adult medical examination without abnormal findings: Secondary | ICD-10-CM

## 2015-01-04 ENCOUNTER — Encounter (HOSPITAL_COMMUNITY): Payer: BLUE CROSS/BLUE SHIELD

## 2015-01-07 ENCOUNTER — Encounter (HOSPITAL_COMMUNITY): Payer: BLUE CROSS/BLUE SHIELD

## 2015-01-09 ENCOUNTER — Encounter (HOSPITAL_COMMUNITY): Payer: BLUE CROSS/BLUE SHIELD

## 2015-01-11 ENCOUNTER — Encounter (HOSPITAL_COMMUNITY): Payer: BLUE CROSS/BLUE SHIELD

## 2015-01-14 ENCOUNTER — Encounter (HOSPITAL_COMMUNITY): Payer: BLUE CROSS/BLUE SHIELD

## 2015-01-14 NOTE — Progress Notes (Addendum)
Cardiac Rehabilitation Program Outcomes Report   Orientation:  11/13/14 Graduate Date:  12/26/14 Discharge Date:  12/26/14 # of sessions completed: 18  Patient had to stop program at 18 sessions due to insurance.   Cardiologist: Nicholes Rough MD:  Haynes Bast Time:  1100  A.  Exercise Program:  Tolerates exercise @ 3.24 METS for 15 minutes, Walk Test Results:  Post: 2.38 mets, Improved functional capacity  22.7 %, Decreased  muscular strength  11.55 % and Progressed to Phase 4 maintenance program  B.  Mental Health:  Good mental attitude  C.  Education/Instruction/Skills  Accurately checks own pulse.  Rest:  71  Exercise:  82, Knows THR for exercise and Uses Perceived Exertion Scale and/or Dyspnea Scale  Uses Perceived Exertion Scale and/or Dyspnea Scale  D.  Nutrition/Weight Control/Body Composition:  Adherence to prescribed nutrition program: good    E.  Blood Lipids   No results found for: CHOL, HDL, LDLCALC, LDLDIRECT, TRIG, CHOLHDL  F.  Lifestyle Changes:  Making positive lifestyle changes  G.  Symptoms noted with exercise:  Asymptomatic  Report Completed By:  Stevphen Rochester RN   Comments:  This is the patients graduation note for [facility].  Patient has done very well in program and goals already met with only 18 sessions.  Patient plans to start the maintenance program September 2nd.

## 2015-01-14 NOTE — Progress Notes (Addendum)
Patient is discharged from [facility] and Pulmonary program today, with 18 sessions.  She achieved LTG of 30 minutes of aerobic exercise at max met level of 3.24.  All patient vitals are WNL.  Patient has not met with dietician.  Discharge instructions have been reviewed in detail and patient expressed an understanding of material given.  Patient plans to join maintenance program. Cardiac Rehab will make 1 month, 6 month and 1 year call backs.  Patient had no complaints of any abnormal S/S or pain on their exit visit.  Patient had to stop program due to insurance.

## 2015-01-14 NOTE — Addendum Note (Signed)
Encounter addended by: Cathie Olden, RN on: 01/14/2015  3:20 PM<BR>     Documentation filed: Clinical Notes, Notes Section

## 2015-01-16 ENCOUNTER — Ambulatory Visit (INDEPENDENT_AMBULATORY_CARE_PROVIDER_SITE_OTHER): Payer: BLUE CROSS/BLUE SHIELD | Admitting: *Deleted

## 2015-01-16 ENCOUNTER — Encounter (HOSPITAL_COMMUNITY): Payer: BLUE CROSS/BLUE SHIELD

## 2015-01-16 DIAGNOSIS — I7121 Aneurysm of the ascending aorta, without rupture: Secondary | ICD-10-CM

## 2015-01-16 DIAGNOSIS — I4891 Unspecified atrial fibrillation: Secondary | ICD-10-CM | POA: Diagnosis not present

## 2015-01-16 DIAGNOSIS — I712 Thoracic aortic aneurysm, without rupture: Secondary | ICD-10-CM | POA: Diagnosis not present

## 2015-01-16 DIAGNOSIS — Q231 Congenital insufficiency of aortic valve: Secondary | ICD-10-CM

## 2015-01-16 DIAGNOSIS — Z5181 Encounter for therapeutic drug level monitoring: Secondary | ICD-10-CM | POA: Diagnosis not present

## 2015-01-16 DIAGNOSIS — Q251 Coarctation of aorta: Secondary | ICD-10-CM

## 2015-01-16 DIAGNOSIS — Z954 Presence of other heart-valve replacement: Secondary | ICD-10-CM | POA: Diagnosis not present

## 2015-01-16 DIAGNOSIS — Z952 Presence of prosthetic heart valve: Secondary | ICD-10-CM

## 2015-01-16 LAB — POCT INR: INR: 1.5

## 2015-01-18 ENCOUNTER — Encounter (HOSPITAL_COMMUNITY): Payer: BLUE CROSS/BLUE SHIELD

## 2015-01-21 ENCOUNTER — Encounter (HOSPITAL_COMMUNITY): Payer: BLUE CROSS/BLUE SHIELD

## 2015-01-23 ENCOUNTER — Emergency Department (HOSPITAL_COMMUNITY)
Admission: EM | Admit: 2015-01-23 | Discharge: 2015-01-23 | Disposition: A | Discharge status: Home / Self Care | Payer: BLUE CROSS/BLUE SHIELD | Attending: Emergency Medicine | Admitting: Emergency Medicine

## 2015-01-23 ENCOUNTER — Encounter (HOSPITAL_COMMUNITY): Payer: Self-pay | Admitting: Emergency Medicine

## 2015-01-23 ENCOUNTER — Encounter (HOSPITAL_COMMUNITY): Payer: BLUE CROSS/BLUE SHIELD

## 2015-01-23 ENCOUNTER — Emergency Department (HOSPITAL_COMMUNITY): Payer: BLUE CROSS/BLUE SHIELD

## 2015-01-23 DIAGNOSIS — W108XXA Fall (on) (from) other stairs and steps, initial encounter: Secondary | ICD-10-CM | POA: Insufficient documentation

## 2015-01-23 DIAGNOSIS — I480 Paroxysmal atrial fibrillation: Secondary | ICD-10-CM | POA: Insufficient documentation

## 2015-01-23 DIAGNOSIS — S2231XA Fracture of one rib, right side, initial encounter for closed fracture: Secondary | ICD-10-CM | POA: Insufficient documentation

## 2015-01-23 DIAGNOSIS — S299XXA Unspecified injury of thorax, initial encounter: Secondary | ICD-10-CM | POA: Diagnosis present

## 2015-01-23 DIAGNOSIS — Y9289 Other specified places as the place of occurrence of the external cause: Secondary | ICD-10-CM | POA: Insufficient documentation

## 2015-01-23 DIAGNOSIS — Z8742 Personal history of other diseases of the female genital tract: Secondary | ICD-10-CM | POA: Diagnosis not present

## 2015-01-23 DIAGNOSIS — Z880 Allergy status to penicillin: Secondary | ICD-10-CM | POA: Diagnosis not present

## 2015-01-23 DIAGNOSIS — Y9301 Activity, walking, marching and hiking: Secondary | ICD-10-CM | POA: Diagnosis not present

## 2015-01-23 DIAGNOSIS — Z7982 Long term (current) use of aspirin: Secondary | ICD-10-CM | POA: Insufficient documentation

## 2015-01-23 DIAGNOSIS — Z8774 Personal history of (corrected) congenital malformations of heart and circulatory system: Secondary | ICD-10-CM | POA: Insufficient documentation

## 2015-01-23 DIAGNOSIS — Z79899 Other long term (current) drug therapy: Secondary | ICD-10-CM | POA: Insufficient documentation

## 2015-01-23 DIAGNOSIS — I509 Heart failure, unspecified: Secondary | ICD-10-CM | POA: Diagnosis not present

## 2015-01-23 DIAGNOSIS — R011 Cardiac murmur, unspecified: Secondary | ICD-10-CM | POA: Diagnosis not present

## 2015-01-23 DIAGNOSIS — Z7901 Long term (current) use of anticoagulants: Secondary | ICD-10-CM | POA: Diagnosis not present

## 2015-01-23 DIAGNOSIS — Y998 Other external cause status: Secondary | ICD-10-CM | POA: Insufficient documentation

## 2015-01-23 LAB — CBC WITH DIFFERENTIAL/PLATELET
Basophils Absolute: 0 10*3/uL (ref 0.0–0.1)
Basophils Relative: 0 %
Eosinophils Absolute: 0.3 10*3/uL (ref 0.0–0.7)
Eosinophils Relative: 3 %
HCT: 43.6 % (ref 36.0–46.0)
Hemoglobin: 13.9 g/dL (ref 12.0–15.0)
LYMPHS PCT: 17 %
Lymphs Abs: 1.7 10*3/uL (ref 0.7–4.0)
MCH: 28.4 pg (ref 26.0–34.0)
MCHC: 31.9 g/dL (ref 30.0–36.0)
MCV: 89 fL (ref 78.0–100.0)
Monocytes Absolute: 0.8 10*3/uL (ref 0.1–1.0)
Monocytes Relative: 8 %
Neutro Abs: 7.2 10*3/uL (ref 1.7–7.7)
Neutrophils Relative %: 72 %
PLATELETS: 312 10*3/uL (ref 150–400)
RBC: 4.9 MIL/uL (ref 3.87–5.11)
RDW: 18.1 % — ABNORMAL HIGH (ref 11.5–15.5)
WBC: 10 10*3/uL (ref 4.0–10.5)

## 2015-01-23 LAB — COMPREHENSIVE METABOLIC PANEL
ALT: 17 U/L (ref 14–54)
AST: 27 U/L (ref 15–41)
Albumin: 3.6 g/dL (ref 3.5–5.0)
Alkaline Phosphatase: 88 U/L (ref 38–126)
Anion gap: 6 (ref 5–15)
BILIRUBIN TOTAL: 0.8 mg/dL (ref 0.3–1.2)
BUN: 8 mg/dL (ref 6–20)
CO2: 26 mmol/L (ref 22–32)
CREATININE: 0.82 mg/dL (ref 0.44–1.00)
Calcium: 8.6 mg/dL — ABNORMAL LOW (ref 8.9–10.3)
Chloride: 108 mmol/L (ref 101–111)
GFR calc Af Amer: >60 mL/min (ref >60)
Glucose, Bld: 93 mg/dL (ref 65–99)
POTASSIUM: 4.1 mmol/L (ref 3.5–5.1)
Sodium: 140 mmol/L (ref 135–145)
TOTAL PROTEIN: 7.5 g/dL (ref 6.5–8.1)

## 2015-01-23 LAB — PROTIME-INR
INR: 2.27 — ABNORMAL HIGH (ref 0.00–1.49)
Prothrombin Time: 24.8 seconds — ABNORMAL HIGH (ref 11.6–15.2)

## 2015-01-23 LAB — LIPID PANEL
CHOL/HDL RATIO: 3.3 ratio
CHOLESTEROL: 144 mg/dL (ref 0–200)
HDL: 44 mg/dL (ref >40)
LDL Cholesterol: 83 mg/dL (ref 0–99)
Triglycerides: 87 mg/dL (ref <150)
VLDL: 17 mg/dL (ref 0–40)

## 2015-01-23 MED ORDER — OXYCODONE-ACETAMINOPHEN 5-325 MG PO TABS: 1.0000 | ORAL_TABLET | ORAL | Status: DC | PRN

## 2015-01-23 NOTE — ED Notes (Signed)
Patient with no complaints at this time. Respirations even and unlabored. Skin warm/dry. Discharge instructions reviewed with patient at this time. Patient given opportunity to voice concerns/ask questions. Patient discharged at this time and left Emergency Department with steady gait.   

## 2015-01-23 NOTE — Discharge Instructions (Signed)

## 2015-01-23 NOTE — ED Notes (Signed)
Slipped on stairs and injured right rib.  Rates pain 7/10.

## 2015-01-23 NOTE — ED Notes (Signed)
Patient given IS, reached 750. Patient states she has been able to reach 1500 before while using IS as she has one previously due to heart surgery. Patient given instructions on how to use IS, verbalized understanding. Return demonstration observed.

## 2015-01-23 NOTE — ED Provider Notes (Signed)
CSN: [id number]     Arrival date & time 01/23/15  0827 History   First MD Initiated Contact with Patient 01/23/15 [phone]     Chief Complaint  Patient presents with  . Rib Injury     (Consider location/radiation/quality/duration/timing/severity/associated sxs/prior Treatment) HPI   Mallory Sutton is a 54 y.o. female with an extensive cardiac history including paroxysmal atrial fibrillation and ascending aortic aneurysm repair in April of this year, followed by Dr. [name], who presents to the Emergency Department complaining of pain to the right anterior chest wall after a injury that occurred 4 days ago. She states that she was walking down her stairs and slipped and fell against the stair railing. She reports continued pain to her ribs since that time. Pain is exacerbated by right arm movement, changing position, cough or deep breathing. Pain improves at rest. She denies actual fall, hemoptysis, abdominal pain, and shortness of breath. She also denies other injuries. She states she came to the emergency room today because her husband was concerned that she is continuing to have pain.   Past Medical History  Diagnosis Date  . Bicuspid aortic valve   . Aortic stenosis   . Paroxysmal atrial fibrillation   . ATRIAL ARRHYTHMIAS   . THORACIC AORTIC ANEURYSM   . COARCTATION OF AORTA   . ENDOMETRIOSIS   . Shortness of breath   . Heart murmur   . CHF (congestive heart failure)    Past Surgical History  Procedure Laterality Date  . Coarctation repair and residual restenosis    . Knee surgery     Family History  Problem Relation Age of Onset  . Family history unknown: Yes   Social History  Substance Use Topics  . Smoking status: Never Smoker   . Smokeless tobacco: Never Used  . Alcohol Use: 0.0 oz/week    0 Standard drinks or equivalent per week     Comment: 3 beers 3 nights a week   OB History    No data available     Review of Systems  Constitutional: Negative for fever and  chills.  HENT: Negative for trouble swallowing.   Respiratory: Negative for cough, chest tightness and shortness of breath.   Cardiovascular: Positive for chest pain (Right rib pain).  Gastrointestinal: Negative for nausea, vomiting and abdominal pain.  Genitourinary: Negative for dysuria, hematuria and flank pain.  Musculoskeletal: Negative for arthralgias and gait problem.  Skin: Negative for wound.  Neurological: Negative for seizures, syncope, weakness and headaches.  All other systems reviewed and are negative.     Allergies  Amiodarone and Penicillins  Home Medications   Prior to Admission medications   Medication Sig Start Date End Date Taking? Authorizing Provider  aspirin EC 81 MG tablet Take 81 mg by mouth daily.    Historical Provider, MD  benzonatate (TESSALON) 100 MG capsule Take 100 mg by mouth at bedtime as needed for cough (100-200 mg as needed for cough).    Historical Provider, MD  Coenzyme Q10 (COQ-10 PO) Take 1 tablet by mouth daily.    Historical Provider, MD  furosemide (LASIX) 40 MG tablet Take 20 mg by mouth daily.     Historical Provider, MD  metoprolol succinate (TOPROL-XL) 25 MG 24 hr tablet Take 1 tablet (25 mg total) by mouth 2 (two) times daily. 09/10/14   Josue Hector, MD  Multiple Vitamin (MULTI-VITAMINS) TABS Take by mouth daily.    Historical Provider, MD  potassium chloride SA (K-DUR,KLOR-CON) 20 MEQ tablet  Take 10 mEq by mouth daily.  08/01/14   Historical Provider, MD  propafenone (RYTHMOL) 225 MG tablet Take 225 mg by mouth 3 (three) times daily.    Historical Provider, MD  trolamine salicylate (ASPERCREME) 10 % cream Apply 1 application topically as needed for muscle pain (Back pain).    Historical Provider, MD  warfarin (COUMADIN) 1 MG tablet Pt takes 2 tablets by mouth daily except 1 tablet on Sat. 12/10/14   Josue Hector, MD   BP 163/73 mmHg  Pulse 68  Temp(Src) 98.1 F (36.7 C) (Oral)  Resp 16  Ht 5\' 4"  (1.626 m)  Wt 118 lb (53.524 kg)   BMI 20.24 kg/m2  SpO2 96%  LMP 07/16/2013 Physical Exam  Constitutional: She is oriented to person, place, and time. She appears well-developed and well-nourished. No distress.  HENT:  Head: Normocephalic and atraumatic.  Neck: Normal range of motion. Neck supple.  Cardiovascular: Normal rate and regular rhythm.   Murmur heard. Pulmonary/Chest: Effort normal and breath sounds normal. No respiratory distress. She exhibits tenderness (Focal tenderness to the right anterior chest wall. No ecchymosis or crepitus. No guarding on exam. no flank tenderness).  Abdominal: Soft. She exhibits no distension. There is no tenderness. There is no rebound and no guarding.  Musculoskeletal: Normal range of motion.  Neurological: She is alert and oriented to person, place, and time. She exhibits normal muscle tone. Coordination normal.  Skin: Skin is warm and dry.  Psychiatric: She has a normal mood and affect.  Nursing note and vitals reviewed.   ED Course  Procedures (including critical care time) Labs Review Labs Reviewed  PROTIME-INR - Abnormal; Notable for the following:    Prothrombin Time 24.8 (*)    INR 2.27 (*)    All other components within normal limits  CBC WITH DIFFERENTIAL/PLATELET - Abnormal; Notable for the following:    RDW 18.1 (*)    All other components within normal limits  COMPREHENSIVE METABOLIC PANEL - Abnormal; Notable for the following:    Calcium 8.6 (*)    All other components within normal limits  LIPID PANEL    Imaging Review Dg Ribs Unilateral W/chest Right  01/23/2015   CLINICAL DATA:  Fall downstairs with right-sided chest pain  EXAM: RIGHT RIBS AND CHEST - 3+ VIEW  COMPARISON:  11/12/2014  FINDINGS: Cardiac shadow is stable. Postsurgical changes are again seen. The lungs are well aerated bilaterally. Resolution of previously seen right-sided pleural effusion is noted. The right rib cage demonstrates evidence of a mildly displaced ninth rib fracture anteriorly. No  complicating factors are noted.  IMPRESSION: Right ninth rib fracture   Electronically Signed   By: Inez Catalina M.D.   On: 01/23/2015 09:27   I have personally reviewed and evaluated these images and lab results as part of my medical decision-making.   EKG Interpretation None      MDM   Final diagnoses:  Closed rib fracture, right, initial encounter    Pt with direct blow to a stair railing 4 days ago.  No actual fall, head or neck injury.  Patient is well-appearing. Ambulates with a steady gait. Labs reassuring.  Vital signs are stable.   Incentive spirometer given and instructed on use.  Pt appears stable for d/c and agrees to arrange close f/u with her PCP for recheck and strict return precautions also given.    Kem Parkinson, PA-C 01/25/15 2234  Gareth Morgan, MD 01/29/15 1034

## 2015-01-25 ENCOUNTER — Encounter (HOSPITAL_COMMUNITY): Payer: BLUE CROSS/BLUE SHIELD

## 2015-01-28 ENCOUNTER — Encounter (HOSPITAL_COMMUNITY): Payer: BLUE CROSS/BLUE SHIELD

## 2015-01-30 ENCOUNTER — Encounter (HOSPITAL_COMMUNITY): Payer: BLUE CROSS/BLUE SHIELD

## 2015-01-30 ENCOUNTER — Ambulatory Visit (INDEPENDENT_AMBULATORY_CARE_PROVIDER_SITE_OTHER): Payer: BLUE CROSS/BLUE SHIELD | Admitting: *Deleted

## 2015-01-30 DIAGNOSIS — Q231 Congenital insufficiency of aortic valve: Secondary | ICD-10-CM

## 2015-01-30 DIAGNOSIS — Z5181 Encounter for therapeutic drug level monitoring: Secondary | ICD-10-CM

## 2015-01-30 DIAGNOSIS — I712 Thoracic aortic aneurysm, without rupture: Secondary | ICD-10-CM

## 2015-01-30 DIAGNOSIS — I7121 Aneurysm of the ascending aorta, without rupture: Secondary | ICD-10-CM

## 2015-01-30 DIAGNOSIS — Q251 Coarctation of aorta: Secondary | ICD-10-CM

## 2015-01-30 DIAGNOSIS — Z954 Presence of other heart-valve replacement: Secondary | ICD-10-CM

## 2015-01-30 DIAGNOSIS — I4891 Unspecified atrial fibrillation: Secondary | ICD-10-CM | POA: Diagnosis not present

## 2015-01-30 DIAGNOSIS — Z952 Presence of prosthetic heart valve: Secondary | ICD-10-CM

## 2015-01-30 LAB — POCT INR: INR: 1.9

## 2015-02-01 ENCOUNTER — Encounter (HOSPITAL_COMMUNITY): Payer: BLUE CROSS/BLUE SHIELD

## 2015-02-04 ENCOUNTER — Encounter (HOSPITAL_COMMUNITY): Payer: BLUE CROSS/BLUE SHIELD

## 2015-02-06 ENCOUNTER — Encounter (HOSPITAL_COMMUNITY): Payer: BLUE CROSS/BLUE SHIELD

## 2015-02-08 ENCOUNTER — Encounter (HOSPITAL_COMMUNITY): Payer: BLUE CROSS/BLUE SHIELD

## 2015-02-13 ENCOUNTER — Ambulatory Visit (INDEPENDENT_AMBULATORY_CARE_PROVIDER_SITE_OTHER): Payer: BLUE CROSS/BLUE SHIELD | Admitting: *Deleted

## 2015-02-13 DIAGNOSIS — I4891 Unspecified atrial fibrillation: Secondary | ICD-10-CM | POA: Diagnosis not present

## 2015-02-13 DIAGNOSIS — I712 Thoracic aortic aneurysm, without rupture: Secondary | ICD-10-CM

## 2015-02-13 DIAGNOSIS — I7121 Aneurysm of the ascending aorta, without rupture: Secondary | ICD-10-CM

## 2015-02-13 DIAGNOSIS — Z5181 Encounter for therapeutic drug level monitoring: Secondary | ICD-10-CM | POA: Diagnosis not present

## 2015-02-13 DIAGNOSIS — Z954 Presence of other heart-valve replacement: Secondary | ICD-10-CM | POA: Diagnosis not present

## 2015-02-13 DIAGNOSIS — Q231 Congenital insufficiency of aortic valve: Secondary | ICD-10-CM

## 2015-02-13 DIAGNOSIS — Z952 Presence of prosthetic heart valve: Secondary | ICD-10-CM

## 2015-02-13 DIAGNOSIS — Q251 Coarctation of aorta: Secondary | ICD-10-CM

## 2015-02-13 LAB — POCT INR: INR: 3

## 2015-02-18 ENCOUNTER — Ambulatory Visit (INDEPENDENT_AMBULATORY_CARE_PROVIDER_SITE_OTHER): Payer: BLUE CROSS/BLUE SHIELD | Admitting: Cardiovascular Disease

## 2015-02-18 ENCOUNTER — Encounter: Payer: Self-pay | Admitting: Cardiovascular Disease

## 2015-02-18 VITALS — BP 122/64 | HR 69 | Ht 64.0 in | Wt 119.2 lb

## 2015-02-18 DIAGNOSIS — Q251 Coarctation of aorta: Secondary | ICD-10-CM

## 2015-02-18 DIAGNOSIS — Z23 Encounter for immunization: Secondary | ICD-10-CM

## 2015-02-18 NOTE — Patient Instructions (Signed)
Your physician wants you to follow-up in: 6 months with [name] will receive a reminder letter in the mail two months in advance. If you don't receive a letter, please call our office to schedule the follow-up appointment.   Your physician has requested that you have an echocardiogram in 6 months before f/u visit. Echocardiography is a painless test that uses sound waves to create images of your heart. It provides your doctor with information about the size and shape of your heart and how well your heart's chambers and valves are working. This procedure takes approximately one hour. There are no restrictions for this procedure.   Your physician recommends that you continue on your current medications as directed. Please refer to the Current Medication list given to you today.     Thank you for choosing [facility] !

## 2015-02-18 NOTE — Progress Notes (Signed)
Patient ID: Mallory Sutton, female   DOB: 04/12/1961, 54 y.o.   MRN: [id number] 54 y.o.  complex cardiac issues. Followed since 99. Had coarctation repair at age 77 with left lateral thoracotomy. W/U in past has been limited by lack of insurance. She has insurance now . She has a known bicuspid AV with ascending aortic aneurysm and re-coarctation. She has PAF    Her echo in August showed progression of gradient with Mean 41 mmHg now severe AS. She denies dyspnea or chest pain  Daughter is living at home and studying psychology at [employer]. Son is in 10 th grade at [employer]. Husband still trucking with lots of routes to [place]   Echo August 2014 with progression of AS  Study Conclusions  - Left ventricle: The cavity size was normal. Wall thickness was increased in a pattern of mild LVH. Systolic function was normal. The estimated ejection fraction was in the range of 60% to 65%. - Aortic valve: AV is difficult to see well It is thickened, calcified with restricted motion. Peak and mean gradients through the valve are 61 and41 mm Hg respectively consistent with moderate to severe AS. Mean gradient is increased from echoin 2013 - Aorta: Descending aorta at area of coarc with peak gradient of 34 mm Hg (2.9 cm/sec)  Cardiac CT: 09/16/11  Indication: Morphology Bicuspid AV, Ascending aortic Aneurysm and previous Coarctation Repair.  Comparison Study: 02/26/10 CT  Findings:  Calcium Score: not done but no calcium seen in arteries on axial Images   Coronary Arteries:  LM-normal LAD-normal D1- normal D2-normal  Circumflex- nondominant and normal  OM1- large and normal  RCA: dominant and normal including PDA/ multiple PLA branches  Aortic Valve: Bicuspid with mild calcification. Area by planimetry over 1.5 cm2. By echo the mean gradient across the valve was 66mmHg and peak gradient 51 mmHg.  Aorta: Severe dilatation of the ascending aortic root particularly at the level of the  RPA. Orthogonal measurements are similar to scan done in 2011. 4.5 x 4.4 cm There is no discection, mural hematoma or ulceration. The great vessels arise normally from the arch. Just distal to the left subclavian there is re-coarctation of the aorta. The minimal luminal diameter is 91mm with an area of .9cm2. By echo the mean gradient from the suprasternal notch was 61mmHg and the peak 30 mmHg. By comparison the descending thoracic aorta measures 1.7cm.  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 Normal size and function. 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 Gradient by TTE are mean of 15 mmHg and peak of 30 mmHg.  Had Wheat procedure at [facility] by [name] 07/25/14 involved mechanical AVR with root replacement. No mention of fixing coarcatation  Also had MAZE and LAA exclusion Dyspnic.  Had diuretic stopped due to azotemia. Lots of nausea.  Seen as add on today for marked dyspnea.  She called [name] nurse and  took a lasix yesterday.  She cannot lay flat.  Nausea some better off amiodarone which Dr Lovena Le stopped on 08/13/14.  INR;s have been high due to interaction and only taking qod.  No bleeding issues.    Admitted to [facility] then transferred to [facility] 4/22 for further care after thoracentesis was bloody.  Suspect this was from suprRx INR and amiodarone interaction.  CT with  No anastomotic leaks or surgical issues.    Spoke personally with [name]  on phone  He made a conscious decision not to fix coarctation as he "did not feel it was severe enough.  Post op echo shows normal AVR function and peak velocity across coarctation of 48 mmHg, mean 23 mmHg and peak velocity of 3.5 m/sec  I believe CT/MRI showed MLD of coarctation area only 55mm With doppler showing diastolic gradient    Lab Results  Component Value Date   HCT 43.6 01/23/2015     CXR done 11/14/14  with marked improvement in effusions reviewed  CXR last month after broke rib fortunately no lung trauma or effusion   Daughter got engaged   ROS: Denies fever, malais, weight loss, blurry vision, decreased visual acuity, cough, sputum, SOB, hemoptysis, pleuritic pain, palpitaitons, heartburn, abdominal pain, melena, lower extremity edema, claudication, or rash.  All other systems reviewed and negative  General: Less anxious  Pale white female  HEENT: normal Neck supple with no adenopathy JVP elevated no bruits no thyromegaly Lungs improved effusions with just mild atelectasis at basis    Coarctation murmur in back  Heart:  J4/H7 click mechanical AVR  murmur, no rub, gallop or click Sternum healing well previous left lateral thoracotomy PMI normal  Coarctation murmur in back  Abdomen: benighn, BS positve, no tenderness, no AAA no bruit.  No HSM or HJR Distal pulses intact with no bruits Plus one bilateral  edema Neuro non-focal Skin warm and dry No muscular weakness   Current Outpatient Prescriptions  Medication Sig Dispense Refill  . aspirin EC 81 MG tablet Take 81 mg by mouth daily.    . benzonatate (TESSALON) 100 MG capsule Take 100 mg by mouth at bedtime as needed for cough (100-200 mg as needed for cough).    . Coenzyme Q10 (COQ-10 PO) Take 1 tablet by mouth daily.    . furosemide (LASIX) 40 MG tablet Take 20 mg by mouth daily.     . metoprolol succinate (TOPROL-XL) 25 MG 24 hr tablet Take 1 tablet (25 mg total) by mouth 2  (two) times daily. 180 tablet 3  . Multiple Vitamin (MULTI-VITAMINS) TABS Take by mouth daily.    Marland Kitchen oxyCODONE-acetaminophen (PERCOCET/ROXICET) 5-325 MG per tablet Take 1 tablet by mouth every 4 (four) hours as needed. 20 tablet 0  . potassium chloride SA (K-DUR,KLOR-CON) 20 MEQ tablet Take 10 mEq by mouth daily.   1  . propafenone (RYTHMOL) 225 MG tablet Take 225 mg by mouth 3 (three) times daily.    Marland Kitchen trolamine salicylate (ASPERCREME) 10 % cream Apply 1 application topically as needed for muscle pain (Back pain).    Marland Kitchen warfarin (COUMADIN) 1 MG tablet Pt takes 2 tablets by mouth daily except 1 tablet on Sat. (Patient taking differently: Take 1 mg by mouth See admin instructions. Take 1 mg twice daily except on Thursday take 1 mg three times daily.) 168 tablet 3   No current facility-administered medications for this visit.    Allergies  Amiodarone and Penicillins  Electrocardiogram:  5/15  SR low  voltage ? Old IMI   08/22/14  SR rate 89  Low voltage  Nonspecific ST changes  Assessment and Plan CHF:  Post surgical pleural effusions improved post thoracentesis x 3.  Blood likely due to high INR while on amiodarone Decrease lasix to 20 mg and K to 10  PAF:  Maint NSR on propofenone  Continue anticoagulaion f/u Lisa In AP should be more stable INR off amiodarone AVR:  Normal mechanical valve function by post op echo normal exam Ileus:  Zofran helping with nausea resolved starting to eat better abdomen not distended Coarctation:  Disagree with decision make to not repair coarctation at time of surgery for aneurysm and bicuspid valve BP well controlled for now  F/u echo 1 year for gradients and MRI for anatomic MLD she is likely to get MRI at [facility]  Aneursym:  In setting of bicuspid valve post Wheat procedure and arch repair with graft.    F/U with me in 3 months with BMET  Jenkins Rouge

## 2015-03-06 ENCOUNTER — Ambulatory Visit (INDEPENDENT_AMBULATORY_CARE_PROVIDER_SITE_OTHER): Payer: BLUE CROSS/BLUE SHIELD | Admitting: *Deleted

## 2015-03-06 DIAGNOSIS — I4891 Unspecified atrial fibrillation: Secondary | ICD-10-CM

## 2015-03-06 DIAGNOSIS — Q251 Coarctation of aorta: Secondary | ICD-10-CM

## 2015-03-06 DIAGNOSIS — Z954 Presence of other heart-valve replacement: Secondary | ICD-10-CM

## 2015-03-06 DIAGNOSIS — Z5181 Encounter for therapeutic drug level monitoring: Secondary | ICD-10-CM

## 2015-03-06 DIAGNOSIS — I712 Thoracic aortic aneurysm, without rupture: Secondary | ICD-10-CM | POA: Diagnosis not present

## 2015-03-06 DIAGNOSIS — I7121 Aneurysm of the ascending aorta, without rupture: Secondary | ICD-10-CM

## 2015-03-06 DIAGNOSIS — Q231 Congenital insufficiency of aortic valve: Secondary | ICD-10-CM

## 2015-03-06 DIAGNOSIS — Z952 Presence of prosthetic heart valve: Secondary | ICD-10-CM

## 2015-03-06 LAB — POCT INR: INR: 2.7

## 2015-04-03 ENCOUNTER — Ambulatory Visit (INDEPENDENT_AMBULATORY_CARE_PROVIDER_SITE_OTHER): Payer: BLUE CROSS/BLUE SHIELD | Admitting: *Deleted

## 2015-04-03 DIAGNOSIS — Z954 Presence of other heart-valve replacement: Secondary | ICD-10-CM

## 2015-04-03 DIAGNOSIS — Q231 Congenital insufficiency of aortic valve: Secondary | ICD-10-CM

## 2015-04-03 DIAGNOSIS — Z5181 Encounter for therapeutic drug level monitoring: Secondary | ICD-10-CM | POA: Diagnosis not present

## 2015-04-03 DIAGNOSIS — I4891 Unspecified atrial fibrillation: Secondary | ICD-10-CM | POA: Diagnosis not present

## 2015-04-03 DIAGNOSIS — Q251 Coarctation of aorta: Secondary | ICD-10-CM

## 2015-04-03 DIAGNOSIS — I712 Thoracic aortic aneurysm, without rupture: Secondary | ICD-10-CM

## 2015-04-03 DIAGNOSIS — Z952 Presence of prosthetic heart valve: Secondary | ICD-10-CM

## 2015-04-03 DIAGNOSIS — I7121 Aneurysm of the ascending aorta, without rupture: Secondary | ICD-10-CM

## 2015-04-03 LAB — POCT INR: INR: 1.6

## 2015-04-10 ENCOUNTER — Ambulatory Visit (INDEPENDENT_AMBULATORY_CARE_PROVIDER_SITE_OTHER): Payer: BLUE CROSS/BLUE SHIELD | Admitting: *Deleted

## 2015-04-10 DIAGNOSIS — Z954 Presence of other heart-valve replacement: Secondary | ICD-10-CM | POA: Diagnosis not present

## 2015-04-10 DIAGNOSIS — Q251 Coarctation of aorta: Secondary | ICD-10-CM

## 2015-04-10 DIAGNOSIS — Q231 Congenital insufficiency of aortic valve: Secondary | ICD-10-CM

## 2015-04-10 DIAGNOSIS — I4891 Unspecified atrial fibrillation: Secondary | ICD-10-CM | POA: Diagnosis not present

## 2015-04-10 DIAGNOSIS — I712 Thoracic aortic aneurysm, without rupture: Secondary | ICD-10-CM | POA: Diagnosis not present

## 2015-04-10 DIAGNOSIS — Z5181 Encounter for therapeutic drug level monitoring: Secondary | ICD-10-CM | POA: Diagnosis not present

## 2015-04-10 DIAGNOSIS — I7121 Aneurysm of the ascending aorta, without rupture: Secondary | ICD-10-CM

## 2015-04-10 DIAGNOSIS — Z952 Presence of prosthetic heart valve: Secondary | ICD-10-CM

## 2015-04-10 LAB — POCT INR: INR: 2.4

## 2015-04-16 ENCOUNTER — Other Ambulatory Visit: Payer: Self-pay

## 2015-04-16 ENCOUNTER — Encounter: Payer: Self-pay | Admitting: Cardiovascular Disease

## 2015-04-16 MED ORDER — PROPAFENONE HCL 225 MG PO TABS: 225.0000 mg | ORAL_TABLET | Freq: Three times a day (TID) | ORAL | Status: DC

## 2015-05-01 ENCOUNTER — Ambulatory Visit (INDEPENDENT_AMBULATORY_CARE_PROVIDER_SITE_OTHER): Payer: BLUE CROSS/BLUE SHIELD | Admitting: *Deleted

## 2015-05-01 DIAGNOSIS — Z5181 Encounter for therapeutic drug level monitoring: Secondary | ICD-10-CM | POA: Diagnosis not present

## 2015-05-01 DIAGNOSIS — Q251 Coarctation of aorta: Secondary | ICD-10-CM

## 2015-05-01 DIAGNOSIS — I4891 Unspecified atrial fibrillation: Secondary | ICD-10-CM

## 2015-05-01 DIAGNOSIS — Z954 Presence of other heart-valve replacement: Secondary | ICD-10-CM | POA: Diagnosis not present

## 2015-05-01 DIAGNOSIS — Q231 Congenital insufficiency of aortic valve: Secondary | ICD-10-CM

## 2015-05-01 DIAGNOSIS — I7121 Aneurysm of the ascending aorta, without rupture: Secondary | ICD-10-CM

## 2015-05-01 DIAGNOSIS — I712 Thoracic aortic aneurysm, without rupture: Secondary | ICD-10-CM

## 2015-05-01 DIAGNOSIS — Z952 Presence of prosthetic heart valve: Secondary | ICD-10-CM

## 2015-05-01 LAB — POCT INR: INR: 1.8

## 2015-09-12 ENCOUNTER — Encounter: Payer: Self-pay | Admitting: Cardiovascular Disease

## 2015-09-12 ENCOUNTER — Other Ambulatory Visit: Payer: Self-pay | Admitting: *Deleted

## 2015-09-12 MED ORDER — WARFARIN SODIUM 1 MG PO TABS: ORAL_TABLET | ORAL | Status: DC

## 2015-09-18 ENCOUNTER — Other Ambulatory Visit: Payer: Self-pay | Admitting: Cardiovascular Disease

## 2015-10-10 ENCOUNTER — Telehealth: Payer: Self-pay | Admitting: Cardiovascular Disease

## 2015-10-10 NOTE — Telephone Encounter (Signed)
Patient is due for her follow-up with Dr. [name].  I tried to reach out to the patient but there was no answer and the voicemail required a remote access code therefore I was unable to leave a message.

## 2015-10-16 ENCOUNTER — Ambulatory Visit (INDEPENDENT_AMBULATORY_CARE_PROVIDER_SITE_OTHER): Payer: Self-pay | Admitting: *Deleted

## 2015-10-16 ENCOUNTER — Other Ambulatory Visit: Payer: Self-pay | Admitting: Cardiovascular Disease

## 2015-10-16 DIAGNOSIS — Q251 Coarctation of aorta: Secondary | ICD-10-CM

## 2015-10-16 DIAGNOSIS — Z954 Presence of other heart-valve replacement: Secondary | ICD-10-CM

## 2015-10-16 DIAGNOSIS — I4891 Unspecified atrial fibrillation: Secondary | ICD-10-CM

## 2015-10-16 DIAGNOSIS — Z952 Presence of prosthetic heart valve: Secondary | ICD-10-CM

## 2015-10-16 DIAGNOSIS — Z5181 Encounter for therapeutic drug level monitoring: Secondary | ICD-10-CM

## 2015-10-16 DIAGNOSIS — Q231 Congenital insufficiency of aortic valve: Secondary | ICD-10-CM

## 2015-10-16 LAB — POCT INR: INR: 3.2

## 2015-10-16 MED ORDER — WARFARIN SODIUM 1 MG PO TABS: ORAL_TABLET | ORAL | Status: DC

## 2015-10-16 NOTE — Telephone Encounter (Signed)
Please advise 

## 2015-11-06 ENCOUNTER — Other Ambulatory Visit: Payer: Self-pay | Admitting: Cardiovascular Disease

## 2015-11-25 ENCOUNTER — Ambulatory Visit (INDEPENDENT_AMBULATORY_CARE_PROVIDER_SITE_OTHER): Payer: Self-pay | Admitting: *Deleted

## 2015-11-25 DIAGNOSIS — Z952 Presence of prosthetic heart valve: Secondary | ICD-10-CM

## 2015-11-25 DIAGNOSIS — Z5181 Encounter for therapeutic drug level monitoring: Secondary | ICD-10-CM

## 2015-11-25 DIAGNOSIS — Z954 Presence of other heart-valve replacement: Secondary | ICD-10-CM

## 2015-11-25 DIAGNOSIS — I4891 Unspecified atrial fibrillation: Secondary | ICD-10-CM

## 2015-11-25 DIAGNOSIS — Q231 Congenital insufficiency of aortic valve: Secondary | ICD-10-CM

## 2015-11-25 DIAGNOSIS — Q251 Coarctation of aorta: Secondary | ICD-10-CM

## 2015-11-25 LAB — POCT INR: INR: 2.8

## 2015-12-25 NOTE — Progress Notes (Signed)
Patient ID: Mallory Sutton, female   DOB: [birth year], 55 y.o.   MRN: [id number] 55 y.o.  complex cardiac issues. Followed since 99. Had coarctation repair at age 51 with left lateral thoracotomy. W/U in past has been limited by lack of insurance. She has insurance now . She has a known bicuspid AV with ascending aortic aneurysm and re-coarctation. She has PAF    Her echo in August showed progression of gradient with Mean 41 mmHg now severe AS. She denies dyspnea or chest pain  Daughter is living at home and studying psychology at [employer]. Son is in 10 th grade at [employer]. Husband still trucking with lots of routes to [place]   Echo August 2014 with progression of AS  Study Conclusions  - Left ventricle: The cavity size was normal. Wall thickness was increased in a pattern of mild LVH. Systolic function was normal. The estimated ejection fraction was in the range of 60% to 65%. - Aortic valve: AV is difficult to see well It is thickened, calcified with restricted motion. Peak and mean gradients through the valve are 61 and41 mm Hg respectively consistent with moderate to severe AS. Mean gradient is increased from echoin 2013 - Aorta: Descending aorta at area of coarc with peak gradient of 34 mm Hg (2.9 cm/sec)  Cardiac CT: 09/16/11  Indication: Morphology Bicuspid AV, Ascending aortic Aneurysm and previous Coarctation Repair.  Comparison Study: 02/26/10 CT  Findings:  Calcium Score: not done but no calcium seen in arteries on axial Images   Coronary Arteries:  LM-normal LAD-normal D1- normal D2-normal  Circumflex- nondominant and normal  OM1- large and normal  RCA: dominant and normal including PDA/ multiple PLA branches  Aortic Valve: Bicuspid with mild calcification. Area by planimetry over 1.5 cm2. By echo the mean gradient across the valve was 72mmHg and peak gradient 51 mmHg.  Aorta: Severe dilatation of the ascending aortic root particularly at the level of the  RPA. Orthogonal measurements are similar to scan done in 2011. 4.5 x 4.4 cm There is no discection, mural hematoma or ulceration. The great vessels arise normally from the arch. Just distal to the left subclavian there is re-coarctation of the aorta. The minimal luminal diameter is 9mm with an area of .9cm2. By echo the mean gradient from the suprasternal notch was 90mmHg and the peak 30 mmHg. By comparison the descending thoracic aorta measures 1.7cm.  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 Normal size and function. 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 Gradient by TTE are mean of 15 mmHg and peak of 30 mmHg.  Had Wheat procedure at [facility] by [name] 07/25/14 involved mechanical AVR with root replacement. No mention of fixing coarcatation  Also had MAZE and LAA exclusion Dyspnic.  Had diuretic stopped due to azotemia. Lots of nausea.  Seen as add on today for marked dyspnea.  She called [name] nurse and  took a lasix yesterday.  She cannot lay flat.  Nausea some better off amiodarone which Dr Lovena Le stopped on 08/13/14.  INR;s have been high due to interaction and only taking qod.  No bleeding issues.    Admitted to [facility] then transferred to [facility] 4/22 for further care after thoracentesis was bloody.  Suspect this was from suprRx INR and amiodarone interaction.  CT with  No anastomotic leaks or surgical issues.    Spoke personally with [name]  on phone  He made a conscious decision not to fix coarctation as he "did not feel it was severe enough.  Post op echo shows normal AVR function and peak velocity across coarctation of 48 mmHg, mean 23 mmHg and peak velocity of 3.5 m/sec  I believe CT/MRI showed MLD of coarctation area only 81mm With doppler showing diastolic gradient    Lab Results  Component Value Date   HCT 43.6 01/23/2015     CXR done 11/14/14  with marked improvement in effusions reviewed  CXR last month after broke rib fortunately no lung trauma or effusion   Daughter got engaged   ROS: Denies fever, malais, weight loss, blurry vision, decreased visual acuity, cough, sputum, SOB, hemoptysis, pleuritic pain, palpitaitons, heartburn, abdominal pain, melena, lower extremity edema, claudication, or rash.  All other systems reviewed and negative  General: Less anxious  Pale white female  HEENT: normal Neck supple with no adenopathy JVP elevated no bruits no thyromegaly Lungs improved effusions with just mild atelectasis at basis    Coarctation murmur in back  Heart:  [id number] click mechanical AVR  murmur, no rub, gallop or click Sternum healing well previous left lateral thoracotomy PMI normal  Coarctation murmur in back  Abdomen: benighn, BS positve, no tenderness, no AAA no bruit.  No HSM or HJR Distal pulses intact with no bruits Plus one bilateral  edema Neuro non-focal Skin warm and dry No muscular weakness   Current Outpatient Prescriptions  Medication Sig Dispense Refill  . aspirin EC 81 MG tablet Take 81 mg by mouth daily.    . benzonatate (TESSALON) 100 MG capsule Take 100 mg by mouth at bedtime as needed for cough (100-200 mg as needed for cough).    . Coenzyme Q10 (COQ-10 PO) Take 1 tablet by mouth daily.    . metoprolol succinate (TOPROL-XL) 25 MG 24 hr tablet TAKE (1) TABLET BY MOUTH TWICE DAILY. 30 tablet 3  . Multiple Vitamin (MULTI-VITAMINS) TABS Take by mouth  daily.    . propafenone (RYTHMOL) 225 MG tablet Take 1 tablet (225 mg total) by mouth 3 (three) times daily. 270 tablet 3  . trolamine salicylate (ASPERCREME) 10 % cream Apply 1 application topically as needed for muscle pain (Back pain).    Marland Kitchen warfarin (COUMADIN) 1 MG tablet Take 2 tablets by mouth daily or as directed 70 tablet 2   No current facility-administered medications for this visit.     Allergies  Amiodarone and Penicillins  Electrocardiogram:  5/15  SR low voltage ? Old IMI   08/22/14  SR rate 89  Low voltage  Nonspecific ST changes  Assessment and Plan CHF:  Post surgical pleural effusions improved post thoracentesis x 3.  Blood likely due to high INR while on amiodarone Decrease lasix to 20 mg and K to 10  PAF:  Maint NSR on propofenone  Continue anticoagulaion f/u Lisa In AP should be more stable INR off amiodarone AVR:  Normal mechanical valve function by post op echo normal  exam Ileus:  Zofran helping with nausea resolved starting to eat better abdomen not distended Coarctation:  Disagree with decision make to not repair coarctation at time of surgery for aneurysm and bicuspid valve BP well controlled for now  F/u echo 1 year for gradients and MRI for anatomic MLD she is likely to get MRI at [facility]  Aneursym:  In setting of bicuspid valve post  arch repair with graft.    F/U with me in 3 months with BMET  Jenkins Rouge

## 2015-12-26 ENCOUNTER — Encounter: Payer: Self-pay | Admitting: Cardiovascular Disease

## 2015-12-26 ENCOUNTER — Ambulatory Visit (INDEPENDENT_AMBULATORY_CARE_PROVIDER_SITE_OTHER): Payer: Self-pay | Admitting: Cardiovascular Disease

## 2015-12-26 ENCOUNTER — Ambulatory Visit (INDEPENDENT_AMBULATORY_CARE_PROVIDER_SITE_OTHER): Payer: BLUE CROSS/BLUE SHIELD | Admitting: *Deleted

## 2015-12-26 ENCOUNTER — Ambulatory Visit: Payer: Self-pay

## 2015-12-26 VITALS — BP 157/81 | HR 50 | Ht 64.0 in | Wt 124.0 lb

## 2015-12-26 DIAGNOSIS — Z954 Presence of other heart-valve replacement: Secondary | ICD-10-CM

## 2015-12-26 DIAGNOSIS — Z952 Presence of prosthetic heart valve: Secondary | ICD-10-CM

## 2015-12-26 DIAGNOSIS — Z5181 Encounter for therapeutic drug level monitoring: Secondary | ICD-10-CM | POA: Diagnosis not present

## 2015-12-26 DIAGNOSIS — I4891 Unspecified atrial fibrillation: Secondary | ICD-10-CM | POA: Diagnosis not present

## 2015-12-26 DIAGNOSIS — I480 Paroxysmal atrial fibrillation: Secondary | ICD-10-CM

## 2015-12-26 DIAGNOSIS — Q251 Coarctation of aorta: Secondary | ICD-10-CM | POA: Diagnosis not present

## 2015-12-26 DIAGNOSIS — Q231 Congenital insufficiency of aortic valve: Secondary | ICD-10-CM

## 2015-12-26 LAB — POCT INR: INR: 2

## 2015-12-26 NOTE — Patient Instructions (Signed)
Your physician wants you to follow-up in: 6 months Dr Myrene Buddy will receive a reminder letter in the mail two months in advance. If you don't receive a letter, please call our office to schedule the follow-up appointment.    Get echo on same day before visit    Your physician recommends that you continue on your current medications as directed. Please refer to the Current Medication list given to you today.    Thank you for choosing [facility] !

## 2015-12-28 ENCOUNTER — Other Ambulatory Visit: Payer: Self-pay | Admitting: Cardiovascular Disease

## 2016-01-27 ENCOUNTER — Ambulatory Visit (INDEPENDENT_AMBULATORY_CARE_PROVIDER_SITE_OTHER): Payer: Self-pay | Admitting: *Deleted

## 2016-01-27 DIAGNOSIS — Z952 Presence of prosthetic heart valve: Secondary | ICD-10-CM

## 2016-01-27 DIAGNOSIS — Q251 Coarctation of aorta: Secondary | ICD-10-CM

## 2016-01-27 DIAGNOSIS — Z954 Presence of other heart-valve replacement: Secondary | ICD-10-CM

## 2016-01-27 DIAGNOSIS — I4891 Unspecified atrial fibrillation: Secondary | ICD-10-CM

## 2016-01-27 DIAGNOSIS — Q231 Congenital insufficiency of aortic valve: Secondary | ICD-10-CM

## 2016-01-27 DIAGNOSIS — Z5181 Encounter for therapeutic drug level monitoring: Secondary | ICD-10-CM

## 2016-01-27 LAB — POCT INR: INR: 4.3

## 2016-02-04 ENCOUNTER — Other Ambulatory Visit: Payer: Self-pay | Admitting: Cardiovascular Disease

## 2016-02-10 ENCOUNTER — Ambulatory Visit (INDEPENDENT_AMBULATORY_CARE_PROVIDER_SITE_OTHER): Payer: Self-pay | Admitting: *Deleted

## 2016-02-10 DIAGNOSIS — Q231 Congenital insufficiency of aortic valve: Secondary | ICD-10-CM

## 2016-02-10 DIAGNOSIS — I4891 Unspecified atrial fibrillation: Secondary | ICD-10-CM

## 2016-02-10 DIAGNOSIS — Q251 Coarctation of aorta: Secondary | ICD-10-CM

## 2016-02-10 DIAGNOSIS — Z952 Presence of prosthetic heart valve: Secondary | ICD-10-CM

## 2016-02-10 DIAGNOSIS — Z5181 Encounter for therapeutic drug level monitoring: Secondary | ICD-10-CM

## 2016-02-10 DIAGNOSIS — Z23 Encounter for immunization: Secondary | ICD-10-CM

## 2016-02-10 LAB — POCT INR: INR: 2.9

## 2016-03-04 ENCOUNTER — Ambulatory Visit (INDEPENDENT_AMBULATORY_CARE_PROVIDER_SITE_OTHER): Payer: Self-pay | Admitting: *Deleted

## 2016-03-04 DIAGNOSIS — Z5181 Encounter for therapeutic drug level monitoring: Secondary | ICD-10-CM

## 2016-03-04 DIAGNOSIS — I4891 Unspecified atrial fibrillation: Secondary | ICD-10-CM

## 2016-03-04 DIAGNOSIS — Q231 Congenital insufficiency of aortic valve: Secondary | ICD-10-CM

## 2016-03-04 DIAGNOSIS — Q251 Coarctation of aorta: Secondary | ICD-10-CM

## 2016-03-04 DIAGNOSIS — Z952 Presence of prosthetic heart valve: Secondary | ICD-10-CM

## 2016-03-04 LAB — POCT INR: INR: 2.3

## 2016-04-08 ENCOUNTER — Ambulatory Visit (INDEPENDENT_AMBULATORY_CARE_PROVIDER_SITE_OTHER): Payer: Self-pay | Admitting: *Deleted

## 2016-04-08 DIAGNOSIS — I4891 Unspecified atrial fibrillation: Secondary | ICD-10-CM

## 2016-04-08 DIAGNOSIS — Z5181 Encounter for therapeutic drug level monitoring: Secondary | ICD-10-CM

## 2016-04-08 DIAGNOSIS — Q231 Congenital insufficiency of aortic valve: Secondary | ICD-10-CM

## 2016-04-08 DIAGNOSIS — Z952 Presence of prosthetic heart valve: Secondary | ICD-10-CM

## 2016-04-08 DIAGNOSIS — Q251 Coarctation of aorta: Secondary | ICD-10-CM

## 2016-04-08 LAB — POCT INR: INR: 1.7

## 2016-04-29 ENCOUNTER — Ambulatory Visit (INDEPENDENT_AMBULATORY_CARE_PROVIDER_SITE_OTHER): Payer: Self-pay | Admitting: *Deleted

## 2016-04-29 DIAGNOSIS — Z5181 Encounter for therapeutic drug level monitoring: Secondary | ICD-10-CM

## 2016-04-29 DIAGNOSIS — I4891 Unspecified atrial fibrillation: Secondary | ICD-10-CM

## 2016-04-29 DIAGNOSIS — Q251 Coarctation of aorta: Secondary | ICD-10-CM

## 2016-04-29 DIAGNOSIS — Z952 Presence of prosthetic heart valve: Secondary | ICD-10-CM

## 2016-04-29 DIAGNOSIS — Q231 Congenital insufficiency of aortic valve: Secondary | ICD-10-CM

## 2016-04-29 LAB — POCT INR: INR: 1.3

## 2016-05-22 ENCOUNTER — Other Ambulatory Visit: Payer: Self-pay | Admitting: Cardiovascular Disease

## 2016-06-15 ENCOUNTER — Ambulatory Visit (INDEPENDENT_AMBULATORY_CARE_PROVIDER_SITE_OTHER): Payer: Self-pay | Admitting: *Deleted

## 2016-06-15 DIAGNOSIS — Z5181 Encounter for therapeutic drug level monitoring: Secondary | ICD-10-CM

## 2016-06-15 DIAGNOSIS — Q231 Congenital insufficiency of aortic valve: Secondary | ICD-10-CM

## 2016-06-15 DIAGNOSIS — Q251 Coarctation of aorta: Secondary | ICD-10-CM

## 2016-06-15 DIAGNOSIS — I4891 Unspecified atrial fibrillation: Secondary | ICD-10-CM

## 2016-06-15 DIAGNOSIS — Z952 Presence of prosthetic heart valve: Secondary | ICD-10-CM

## 2016-06-15 LAB — POCT INR: INR: 1.8

## 2016-07-08 ENCOUNTER — Ambulatory Visit (INDEPENDENT_AMBULATORY_CARE_PROVIDER_SITE_OTHER): Payer: Self-pay | Admitting: *Deleted

## 2016-07-08 DIAGNOSIS — Z5181 Encounter for therapeutic drug level monitoring: Secondary | ICD-10-CM

## 2016-07-08 DIAGNOSIS — Z952 Presence of prosthetic heart valve: Secondary | ICD-10-CM

## 2016-07-08 DIAGNOSIS — Q231 Congenital insufficiency of aortic valve: Secondary | ICD-10-CM

## 2016-07-08 DIAGNOSIS — I4891 Unspecified atrial fibrillation: Secondary | ICD-10-CM

## 2016-07-08 DIAGNOSIS — Q251 Coarctation of aorta: Secondary | ICD-10-CM

## 2016-07-08 LAB — POCT INR: INR: 1.9

## 2016-07-13 ENCOUNTER — Other Ambulatory Visit: Payer: Self-pay | Admitting: Cardiovascular Disease

## 2016-08-20 ENCOUNTER — Other Ambulatory Visit: Payer: Self-pay | Admitting: Cardiovascular Disease

## 2016-10-01 ENCOUNTER — Other Ambulatory Visit: Payer: Self-pay | Admitting: Cardiovascular Disease

## 2016-10-26 ENCOUNTER — Ambulatory Visit (INDEPENDENT_AMBULATORY_CARE_PROVIDER_SITE_OTHER): Payer: Self-pay | Admitting: *Deleted

## 2016-10-26 DIAGNOSIS — I480 Paroxysmal atrial fibrillation: Secondary | ICD-10-CM

## 2016-10-26 DIAGNOSIS — Z952 Presence of prosthetic heart valve: Secondary | ICD-10-CM

## 2016-10-26 DIAGNOSIS — Q231 Congenital insufficiency of aortic valve: Secondary | ICD-10-CM

## 2016-10-26 DIAGNOSIS — I4891 Unspecified atrial fibrillation: Secondary | ICD-10-CM

## 2016-10-26 DIAGNOSIS — Q251 Coarctation of aorta: Secondary | ICD-10-CM

## 2016-10-26 DIAGNOSIS — Z5181 Encounter for therapeutic drug level monitoring: Secondary | ICD-10-CM

## 2016-10-26 LAB — POCT INR: INR: 2.9

## 2016-12-10 ENCOUNTER — Other Ambulatory Visit: Payer: Self-pay | Admitting: Cardiovascular Disease

## 2016-12-16 ENCOUNTER — Ambulatory Visit (INDEPENDENT_AMBULATORY_CARE_PROVIDER_SITE_OTHER): Payer: Self-pay | Admitting: *Deleted

## 2016-12-16 DIAGNOSIS — Z952 Presence of prosthetic heart valve: Secondary | ICD-10-CM

## 2016-12-16 DIAGNOSIS — I4891 Unspecified atrial fibrillation: Secondary | ICD-10-CM

## 2016-12-16 DIAGNOSIS — I480 Paroxysmal atrial fibrillation: Secondary | ICD-10-CM

## 2016-12-16 DIAGNOSIS — Q231 Congenital insufficiency of aortic valve: Secondary | ICD-10-CM

## 2016-12-16 DIAGNOSIS — Z5181 Encounter for therapeutic drug level monitoring: Secondary | ICD-10-CM

## 2016-12-16 DIAGNOSIS — Q251 Coarctation of aorta: Secondary | ICD-10-CM

## 2016-12-16 LAB — POCT INR: INR: 2.6

## 2016-12-16 MED ORDER — WARFARIN SODIUM 1 MG PO TABS: ORAL_TABLET | ORAL | 4 refills | Status: DC

## 2017-01-18 ENCOUNTER — Other Ambulatory Visit: Payer: Self-pay | Admitting: Cardiovascular Disease

## 2017-01-25 ENCOUNTER — Ambulatory Visit (INDEPENDENT_AMBULATORY_CARE_PROVIDER_SITE_OTHER): Payer: Self-pay | Admitting: *Deleted

## 2017-01-25 DIAGNOSIS — I4891 Unspecified atrial fibrillation: Secondary | ICD-10-CM

## 2017-01-25 DIAGNOSIS — Q231 Congenital insufficiency of aortic valve: Secondary | ICD-10-CM

## 2017-01-25 DIAGNOSIS — Q251 Coarctation of aorta: Secondary | ICD-10-CM

## 2017-01-25 DIAGNOSIS — I480 Paroxysmal atrial fibrillation: Secondary | ICD-10-CM

## 2017-01-25 DIAGNOSIS — Z5181 Encounter for therapeutic drug level monitoring: Secondary | ICD-10-CM

## 2017-01-25 DIAGNOSIS — Z952 Presence of prosthetic heart valve: Secondary | ICD-10-CM

## 2017-01-25 LAB — POCT INR: INR: 2.7

## 2017-01-25 MED ORDER — WARFARIN SODIUM 1 MG PO TABS
ORAL_TABLET | ORAL | 3 refills | Status: DC
Start: 2017-01-25 — End: 2017-06-24

## 2017-02-25 ENCOUNTER — Other Ambulatory Visit: Payer: Self-pay | Admitting: Cardiovascular Disease

## 2017-03-15 ENCOUNTER — Ambulatory Visit (INDEPENDENT_AMBULATORY_CARE_PROVIDER_SITE_OTHER): Payer: Self-pay | Admitting: *Deleted

## 2017-03-15 DIAGNOSIS — I4891 Unspecified atrial fibrillation: Secondary | ICD-10-CM

## 2017-03-15 DIAGNOSIS — Q251 Coarctation of aorta: Secondary | ICD-10-CM

## 2017-03-15 DIAGNOSIS — I480 Paroxysmal atrial fibrillation: Secondary | ICD-10-CM

## 2017-03-15 DIAGNOSIS — Z23 Encounter for immunization: Secondary | ICD-10-CM

## 2017-03-15 DIAGNOSIS — Z952 Presence of prosthetic heart valve: Secondary | ICD-10-CM

## 2017-03-15 DIAGNOSIS — Z5181 Encounter for therapeutic drug level monitoring: Secondary | ICD-10-CM

## 2017-03-15 DIAGNOSIS — Q231 Congenital insufficiency of aortic valve: Secondary | ICD-10-CM

## 2017-03-15 LAB — POCT INR: INR: 1.9

## 2017-03-31 ENCOUNTER — Other Ambulatory Visit: Payer: Self-pay | Admitting: Cardiovascular Disease

## 2017-04-01 NOTE — Telephone Encounter (Signed)
This is a [facility] pt. Please address 

## 2017-04-02 IMAGING — DX DG CHEST 2V
2 series · 2 of 2 positions shown · non-contrast
Comparison: PA and lateral chest x-ray October 08, 2014

CLINICAL DATA: Follow-up exam due to pleural effusion. No fever or
chest symptoms. It history of aortic root repair

EXAM:
CHEST  2 VIEW

[chest pa]
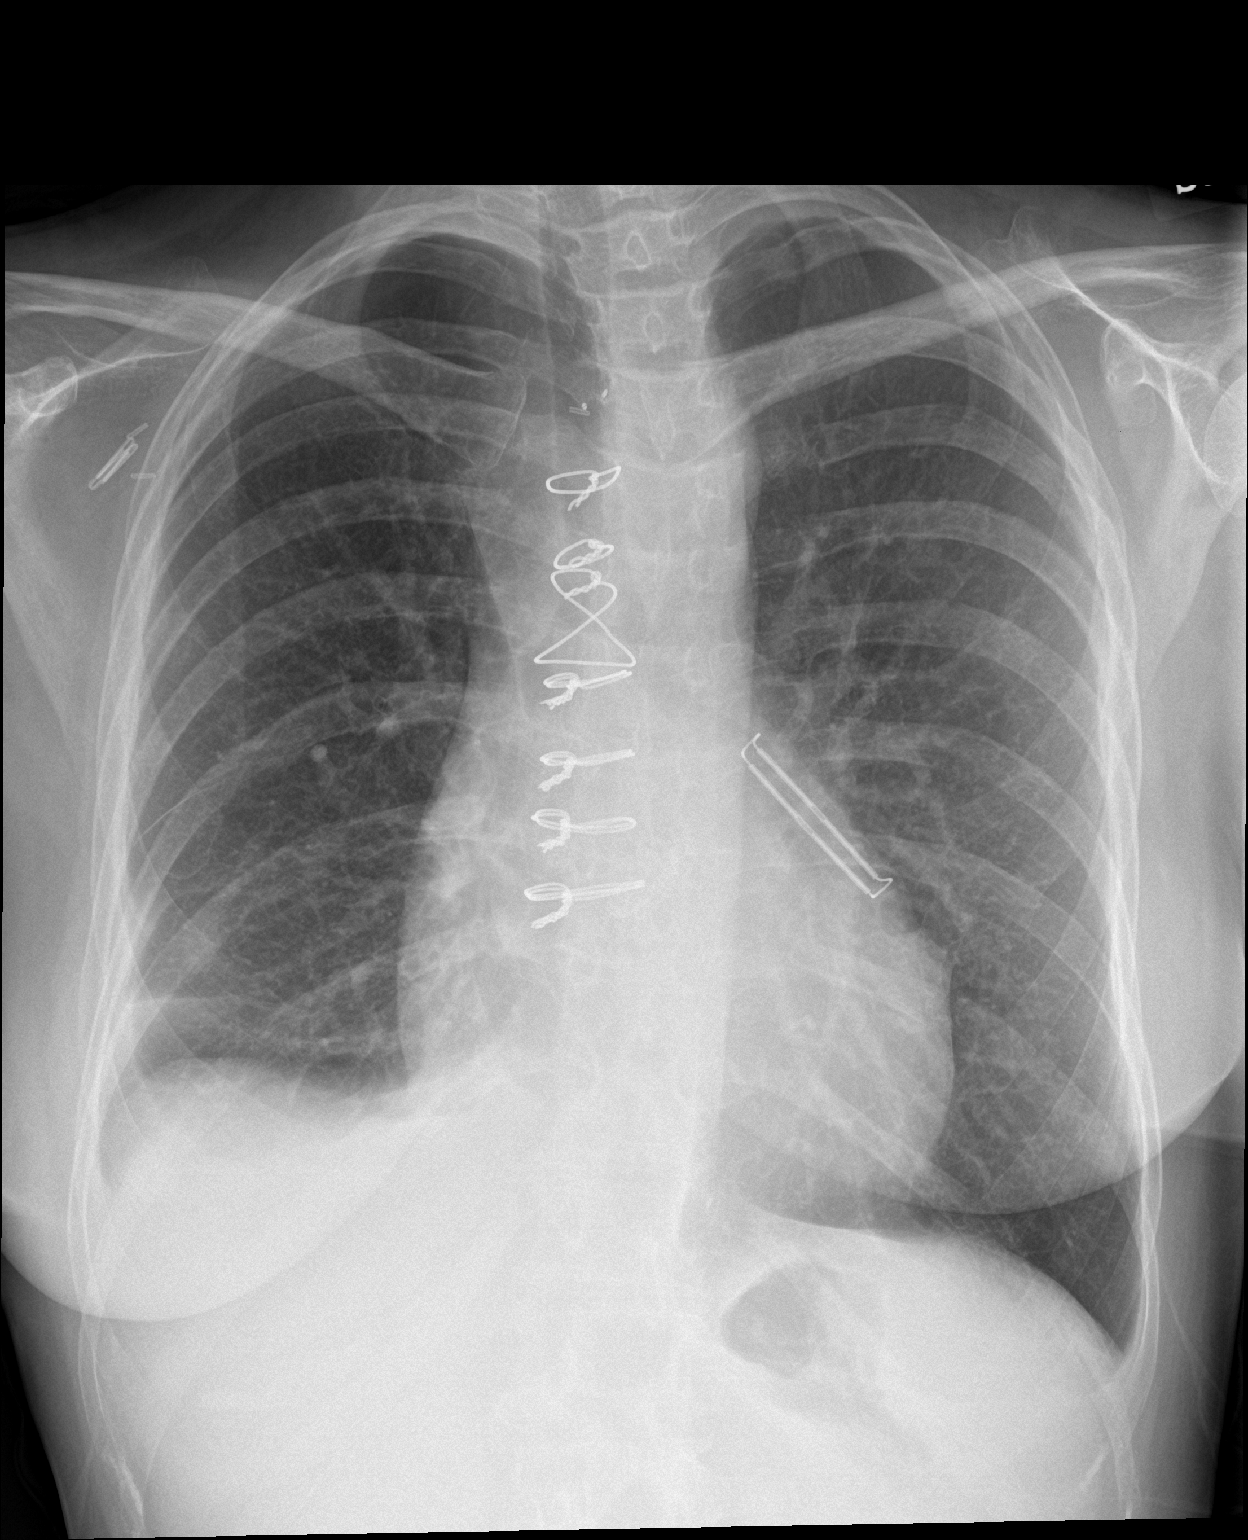

[chest lat]
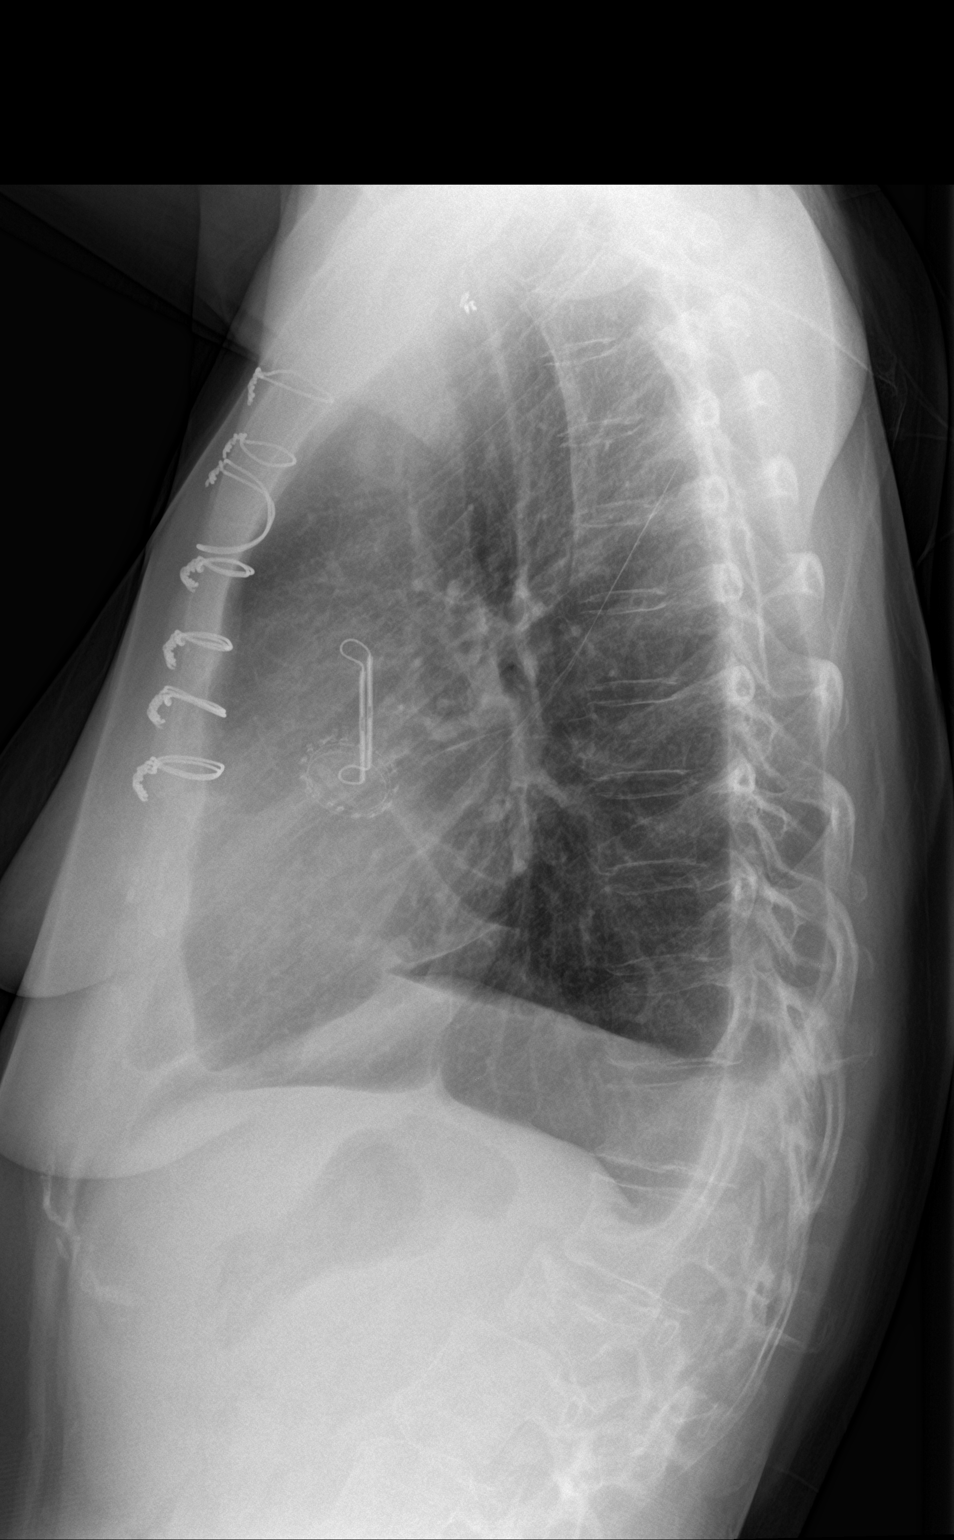

[2 of 2 positions shown; findings below may reference images not displayed]

FINDINGS: The left lung is hyperinflated and clear. There a tiny amount of
fluid blunting the left posterior costophrenic angle. On the right
there is persistent mild volume loss consistent with small effusion.
The volume of this effusion has decreased further since the previous
study. The heart is normal in size. A left atrial appendage clip is
present. The sternal wires are intact. There is stable wedge
compression of the L2 vertebral level.
IMPRESSION: COPD. Interval decrease in the right pleural effusion but a small
effusion persists. A trace left pleural effusion persists as well.
There is no pulmonary edema. There are post surgical changes
involving the heart and mediastinum.

## 2017-04-19 ENCOUNTER — Ambulatory Visit (INDEPENDENT_AMBULATORY_CARE_PROVIDER_SITE_OTHER): Payer: Self-pay | Admitting: *Deleted

## 2017-04-19 DIAGNOSIS — I4891 Unspecified atrial fibrillation: Secondary | ICD-10-CM

## 2017-04-19 DIAGNOSIS — Q251 Coarctation of aorta: Secondary | ICD-10-CM

## 2017-04-19 DIAGNOSIS — Q231 Congenital insufficiency of aortic valve: Secondary | ICD-10-CM

## 2017-04-19 DIAGNOSIS — I480 Paroxysmal atrial fibrillation: Secondary | ICD-10-CM

## 2017-04-19 DIAGNOSIS — Z5181 Encounter for therapeutic drug level monitoring: Secondary | ICD-10-CM

## 2017-04-19 DIAGNOSIS — Z952 Presence of prosthetic heart valve: Secondary | ICD-10-CM

## 2017-04-19 LAB — POCT INR: INR: 1.9

## 2017-05-06 ENCOUNTER — Other Ambulatory Visit: Payer: Self-pay | Admitting: Cardiovascular Disease

## 2017-05-24 ENCOUNTER — Ambulatory Visit (INDEPENDENT_AMBULATORY_CARE_PROVIDER_SITE_OTHER): Payer: Self-pay | Admitting: *Deleted

## 2017-05-24 DIAGNOSIS — I4891 Unspecified atrial fibrillation: Secondary | ICD-10-CM

## 2017-05-24 DIAGNOSIS — Z5181 Encounter for therapeutic drug level monitoring: Secondary | ICD-10-CM

## 2017-05-24 DIAGNOSIS — Q231 Congenital insufficiency of aortic valve: Secondary | ICD-10-CM

## 2017-05-24 DIAGNOSIS — Q251 Coarctation of aorta: Secondary | ICD-10-CM

## 2017-05-24 DIAGNOSIS — I480 Paroxysmal atrial fibrillation: Secondary | ICD-10-CM

## 2017-05-24 DIAGNOSIS — Z952 Presence of prosthetic heart valve: Secondary | ICD-10-CM

## 2017-05-24 LAB — POCT INR: INR: 2.7

## 2017-05-24 NOTE — Patient Instructions (Signed)
Continue coumadin 3 tablets daily Recheck in 6 wks  

## 2017-05-27 ENCOUNTER — Other Ambulatory Visit: Payer: Self-pay | Admitting: Cardiovascular Disease

## 2017-06-24 ENCOUNTER — Other Ambulatory Visit: Payer: Self-pay | Admitting: Cardiovascular Disease

## 2017-07-05 ENCOUNTER — Ambulatory Visit (INDEPENDENT_AMBULATORY_CARE_PROVIDER_SITE_OTHER): Payer: Self-pay | Admitting: *Deleted

## 2017-07-05 DIAGNOSIS — I480 Paroxysmal atrial fibrillation: Secondary | ICD-10-CM

## 2017-07-05 DIAGNOSIS — I4891 Unspecified atrial fibrillation: Secondary | ICD-10-CM

## 2017-07-05 DIAGNOSIS — Z5181 Encounter for therapeutic drug level monitoring: Secondary | ICD-10-CM

## 2017-07-05 LAB — POCT INR: INR: 2.3

## 2017-07-05 NOTE — Patient Instructions (Signed)
Continue coumadin 3 tablets daily Recheck in 6 wks  

## 2017-07-15 ENCOUNTER — Other Ambulatory Visit: Payer: Self-pay | Admitting: Cardiovascular Disease

## 2017-07-15 ENCOUNTER — Other Ambulatory Visit: Payer: Self-pay

## 2017-07-15 DIAGNOSIS — Q231 Congenital insufficiency of aortic valve: Secondary | ICD-10-CM

## 2017-07-16 ENCOUNTER — Other Ambulatory Visit: Payer: Self-pay

## 2017-07-16 DIAGNOSIS — I350 Nonrheumatic aortic (valve) stenosis: Secondary | ICD-10-CM

## 2017-07-18 NOTE — Progress Notes (Signed)
Patient ID: Mallory Sutton, female   DOB: [birth year], 57 y.o.   MRN: [id number]   57 y.o. 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 Coarctation repair age 29 left thoracotomy. Bicuspid AV with ascending aortic aneurysm Followed for year and ultimately referred to [facility] for AVR/Arch repair and bypass of coarctation which unfortunately [name] decided not to do She also is known to have persistent left sided SVC  Had Wheat procedure at [facility] by [name] 07/25/14 involved mechanical AVR with root replacement. No mention of fixing coarcatation  Also had MAZE and LAA exclusion Post op course complicated by PAF , pleural effusion requiring thoracentesis.    Spoke personally with [name]  on phone  He made a conscious decision not to fix coarctation as he "did not feel it was severe enough.    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 Ankle joints hurt Pain worse at night with some edema. INR Rx  ROS: Denies fever, malais, weight loss, blurry vision, decreased visual acuity, cough, sputum, SOB, hemoptysis, pleuritic pain, palpitaitons, heartburn, abdominal pain, melena, lower extremity edema, claudication, or rash.  All other systems reviewed and negative  General: Vitals:   07/20/17 1524  BP: (!) 160/84  Pulse: 80  SpO2: 98%   Affect appropriate Healthy:  appears stated age HEENT: normal Neck supple with no adenopathy JVP normal no bruits no thyromegaly Lungs clear with no wheezing and good diaphragmatic motion Heart:  K2/H0 click SEM through AVR  No AR  murmur, no rub, gallop or click PMI normal Post left thoracotomy and sternotomy. Coarctation murmur radiates to  back  Abdomen: benighn, BS positve, no tenderness, no AAA no bruit.  No HSM or HJR Distal pulses intact with no bruits No edema Neuro non-focal Skin warm and dry No muscular weakness   Current Outpatient Medications  Medication Sig Dispense Refill  . aspirin EC 81 MG tablet Take 81 mg by mouth daily.    . benzonatate (TESSALON) 100 MG capsule Take 100 mg by mouth at bedtime as needed for cough (100-200 mg as needed for cough).    . Coenzyme Q10 (COQ-10 PO) Take 1 tablet by mouth daily.    . metoprolol succinate (TOPROL-XL) 25 MG 24 hr tablet TAKE (1) TABLET BY MOUTH TWICE DAILY. 60 tablet 0  . Multiple Vitamin (MULTI-VITAMINS) TABS Take by mouth daily.    . propafenone (RYTHMOL) 225 MG tablet TAKE (1) TABLET BY MOUTH (3) TIMES DAILY. 90 tablet 0  . propafenone (RYTHMOL) 225 MG tablet TAKE (1) TABLET BY MOUTH (3) TIMES DAILY. 90 tablet 0  . propafenone (RYTHMOL) 225 MG tablet TAKE (1) TABLET BY MOUTH (3) TIMES DAILY. 90 tablet 1  . trolamine salicylate (ASPERCREME) 10 % cream Apply 1 application topically as needed for muscle pain (Back pain).    Marland Kitchen warfarin (COUMADIN) 1 MG tablet TAKE 3 TABLETS DAILY, EXCEPT 2 TABLETS ON MONDAY. 80 tablet 0   No current facility-administered medications for this visit.     Allergies  Amiodarone and Penicillins  Electrocardiogram:  5/15  SR low voltage ? Old IMI   08/22/14  SR rate 89  Low voltage  Nonspecific ST changes  Assessment and Plan  PAF:  Maint NSR on propofenone  Continue anticoagulaion f/u Lisa In AP  AVR:  Needs f/u echo for AVR/EF coarctation SBE prophylaxis coumadin Coarctation:  Disagree with decision make to not repair coarctation at time of surgery for aneurysm and bicuspid valve at some point will need f/u MRI/MRA discussed with patient   HTN:  Well controlled.  Continue current medications and low sodium Dash type diet.   Aneursym:  In setting of bicuspid valve post  arch repair with graft.  MRI / MRA   Neuropathy:  Bilateral  painful legs. Should not be DVT on Rx coumadin. No evidence of new varicosities Will order lab work and start gabapentin and Rx pain tramadol Needs to f/u with primary   F/U with me in 6 months   Jenkins Rouge

## 2017-07-20 ENCOUNTER — Other Ambulatory Visit (HOSPITAL_COMMUNITY)
Admission: RE | Admit: 2017-07-20 | Discharge: 2017-07-20 | Disposition: A | Discharge status: Home / Self Care | Payer: BLUE CROSS/BLUE SHIELD | Source: Ambulatory Visit | Attending: Cardiovascular Disease | Admitting: Cardiovascular Disease

## 2017-07-20 ENCOUNTER — Ambulatory Visit (INDEPENDENT_AMBULATORY_CARE_PROVIDER_SITE_OTHER): Payer: Self-pay | Admitting: Cardiovascular Disease

## 2017-07-20 ENCOUNTER — Encounter: Payer: Self-pay | Admitting: Cardiovascular Disease

## 2017-07-20 ENCOUNTER — Ambulatory Visit (HOSPITAL_COMMUNITY)
Admission: RE | Admit: 2017-07-20 | Discharge: 2017-07-20 | Disposition: A | Discharge status: Home / Self Care | Payer: BLUE CROSS/BLUE SHIELD | Source: Ambulatory Visit | Attending: Cardiovascular Disease | Admitting: Cardiovascular Disease

## 2017-07-20 VITALS — BP 160/84 | HR 80 | Ht 64.0 in | Wt 112.0 lb

## 2017-07-20 DIAGNOSIS — I071 Rheumatic tricuspid insufficiency: Secondary | ICD-10-CM | POA: Insufficient documentation

## 2017-07-20 DIAGNOSIS — G629 Polyneuropathy, unspecified: Secondary | ICD-10-CM

## 2017-07-20 DIAGNOSIS — I480 Paroxysmal atrial fibrillation: Secondary | ICD-10-CM

## 2017-07-20 DIAGNOSIS — I371 Nonrheumatic pulmonary valve insufficiency: Secondary | ICD-10-CM | POA: Insufficient documentation

## 2017-07-20 DIAGNOSIS — Q251 Coarctation of aorta: Secondary | ICD-10-CM | POA: Insufficient documentation

## 2017-07-20 DIAGNOSIS — Z952 Presence of prosthetic heart valve: Secondary | ICD-10-CM | POA: Insufficient documentation

## 2017-07-20 DIAGNOSIS — I350 Nonrheumatic aortic (valve) stenosis: Secondary | ICD-10-CM

## 2017-07-20 LAB — COMPREHENSIVE METABOLIC PANEL
ALT: 14 U/L (ref 14–54)
ANION GAP: 13 (ref 5–15)
AST: 19 U/L (ref 15–41)
Albumin: 3.8 g/dL (ref 3.5–5.0)
Alkaline Phosphatase: 99 U/L (ref 38–126)
BUN: 10 mg/dL (ref 6–20)
CHLORIDE: 100 mmol/L — AB (ref 101–111)
CO2: 24 mmol/L (ref 22–32)
CREATININE: 0.85 mg/dL (ref 0.44–1.00)
Calcium: 9.2 mg/dL (ref 8.9–10.3)
Glucose, Bld: 99 mg/dL (ref 65–99)
POTASSIUM: 3.8 mmol/L (ref 3.5–5.1)
SODIUM: 137 mmol/L (ref 135–145)
Total Bilirubin: 0.8 mg/dL (ref 0.3–1.2)
Total Protein: 8.3 g/dL — ABNORMAL HIGH (ref 6.5–8.1)

## 2017-07-20 LAB — TSH: TSH: 1.439 u[IU]/mL (ref 0.350–4.500)

## 2017-07-20 LAB — VITAMIN B12: VITAMIN B 12: 150 pg/mL — AB (ref 180–914)

## 2017-07-20 LAB — SEDIMENTATION RATE: Sed Rate: 9 mm/hr (ref 0–22)

## 2017-07-20 MED ORDER — GABAPENTIN 300 MG PO CAPS: 300.0000 mg | ORAL_CAPSULE | Freq: Three times a day (TID) | ORAL | 3 refills | Status: DC

## 2017-07-20 MED ORDER — TRAMADOL HCL 50 MG PO TABS: 50.0000 mg | ORAL_TABLET | Freq: Three times a day (TID) | ORAL | 3 refills | Status: DC

## 2017-07-20 NOTE — Patient Instructions (Signed)
Medication Instructions:  START TRAMADOL 50 MG THREE TIMES DAILY FOR 2 WEEKS   START GABAPENTIN 300 MG THREE TIMES DAILY   Labwork: TODAY   Testing/Procedures: NONE  Follow-Up: Your physician recommends that you schedule a follow-up appointment in: ASAP IN [place]    Any Other Special Instructions Will Be Listed Below (If Applicable).     If you need a refill on your cardiac medications before your next appointment, please call your pharmacy.

## 2017-07-20 NOTE — Progress Notes (Signed)
*  PRELIMINARY RESULTS* Echocardiogram 2D Echocardiogram has been performed.  Mallory Sutton 07/20/2017, 3:20 PM

## 2017-07-21 LAB — RHEUMATOID FACTOR: Rhuematoid fact SerPl-aCnc: 10.6 IU/mL (ref 0.0–13.9)

## 2017-07-21 LAB — ANTINUCLEAR ANTIBODIES, IFA: ANA Ab, IFA: NEGATIVE

## 2017-08-04 NOTE — Progress Notes (Deleted)
Patient ID: Mallory Sutton, female   DOB: 1961-02-04, 57 y.o.   MRN: [id number]   57 y.o. followed since 57 for complex congenital heart disease. Coarctation repair age 56 left thoracotomy. Bicuspid AV with ascending aortic aneurysm Followed for year and ultimately referred to [facility] for AVR/Arch repair and bypass of coarctation which unfortunately [name] decided not to do She also is known to have persistent left sided SVC  Had Wheat procedure at [facility] by [name] 07/25/14 involved mechanical AVR with root replacement. No mention of fixing coarcatation  Also had MAZE and LAA exclusion Post op course complicated by PAF , pleural effusion requiring thoracentesis.    Spoke personally with [name]  on phone  He made a conscious decision not to fix coarctation as he "did not feel it was severe enough.    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 Ankle joints hurt Pain worse at night with some edema. INR Rx Labs ok with negative ANA RF and ESR only 9 TSH 1.4  B12 low at 150  I started her on gabapentin and tramadol and asked her to f/u with primary   Echo updated 07/20/17 reviewed EF 60-65%  Trivial AR DVI .53  Persistent coarctation with peak velocity 4 m/sec mean gradient 14 peak 27 mmHg Trivial AR  ***   ROS: Denies fever, malais, weight loss, blurry vision, decreased visual acuity, cough, sputum, SOB, hemoptysis, pleuritic pain, palpitaitons, heartburn, abdominal pain, melena, lower extremity edema, claudication, or rash.  All other systems reviewed and negative  General: There were no vitals filed for this visit. Affect appropriate Healthy:  appears stated age 57: normal Neck supple with no adenopathy JVP normal no bruits no thyromegaly Lungs clear with no wheezing and good diaphragmatic motion Heart:  S1/S2click with SEM through AVR murmur, no rub, gallop or click PMI normal post left  thoracotomy and sternotomy Coarctation murmur in back  Abdomen: benighn, BS positve, no tenderness, no AAA no bruit.  No HSM or HJR Distal pulses intact with no bruits No edema Neuro non-focal Skin warm and dry No muscular weakness    Current Outpatient Medications  Medication Sig Dispense Refill  . aspirin EC 81 MG tablet Take 81 mg by mouth daily.    . benzonatate (TESSALON) 100 MG capsule Take 100 mg by mouth at bedtime as needed for cough (100-200 mg as needed for cough).    . Coenzyme Q10 (COQ-10 PO) Take 1 tablet by mouth daily.    Marland Kitchen gabapentin (NEURONTIN) 300 MG capsule Take 1 capsule (300 mg total) by mouth 3 (three) times daily. 90 capsule 3  . metoprolol succinate (TOPROL-XL) 25 MG 24 hr tablet TAKE (1) TABLET BY MOUTH TWICE DAILY. 60 tablet 0  . Multiple Vitamin (MULTI-VITAMINS) TABS Take by mouth daily.    . propafenone (RYTHMOL) 225 MG tablet TAKE (1) TABLET BY MOUTH (3) TIMES DAILY. 90 tablet 0  . propafenone (RYTHMOL) 225 MG tablet TAKE (1) TABLET BY MOUTH (3) TIMES DAILY. 90 tablet 0  . propafenone (RYTHMOL) 225 MG tablet TAKE (1) TABLET BY MOUTH (3) TIMES DAILY. 90 tablet 1  . traMADol (ULTRAM) 50 MG tablet Take 1 tablet (50 mg total) by mouth 3 (three) times daily. 90 tablet 3  . trolamine salicylate (ASPERCREME) 10 % cream Apply 1 application topically as needed for muscle pain (Back pain).    Marland Kitchen warfarin (COUMADIN) 1 MG tablet TAKE  3 TABLETS DAILY, EXCEPT 2 TABLETS ON MONDAY. 80 tablet 0   No current facility-administered medications for this visit.     Allergies  Amiodarone and Penicillins  Electrocardiogram:  5/15  SR low voltage ? Old IMI   08/22/14  SR rate 89  Low voltage  Nonspecific ST changes  Assessment and Plan  PAF:  Maint NSR on propofenone  Continue anticoagulaion f/u [facility]  AVR:  Normal root and valve function by echo 07/20/17 [facility]  Coarctation:  Disagree with decision make to not repair coarctation at time of surgery for aneurysm and bicuspid  valve at some point will need f/u MRI/MRA discussed with patient   HTN:  Well controlled.  Continue current medications and low sodium Dash type diet.   Aneursym:  In setting of bicuspid valve post  arch repair with graft.  MRI / MRA   Neuropathy:  Bilateral painful legs. Good pulses, negative rheumatology w/u preliminary no new varicosities Started gabapentin and tramadol 07/20/17 ***  F/U with me in 6 months   Jenkins Rouge

## 2017-08-06 ENCOUNTER — Ambulatory Visit: Payer: BLUE CROSS/BLUE SHIELD | Admitting: Cardiovascular Disease

## 2017-08-09 ENCOUNTER — Telehealth: Payer: Self-pay

## 2017-08-09 NOTE — Telephone Encounter (Addendum)
Left message for patient to call back. Patient has been moved to 10:45 pm appt slot. When patient calls she will be informed of change.

## 2017-08-10 NOTE — Telephone Encounter (Signed)
Patient is fine with coming in at 10:45 for her appointment on 08/18/17.

## 2017-08-10 NOTE — Progress Notes (Deleted)
Patient ID: Mallory Sutton, female   DOB: 11-Dec-1960, 57 y.o.   MRN: [id number]   57 y.o. followed since 36 for complex congenital heart disease. Coarctation repair age 71 left thoracotomy. Bicuspid AV with ascending aortic aneurysm Followed for year and ultimately referred to [facility] for AVR/Arch repair and bypass of coarctation which unfortunately [name] decided not to do She also is known to have persistent left sided SVC  Had Wheat procedure at [facility] by [name] 07/25/14 involved mechanical AVR with root replacement. No mention of fixing coarcatation  Also had MAZE and LAA exclusion Post op course complicated by PAF , pleural effusion requiring thoracentesis.    Spoke personally with [name]  on phone  He made a conscious decision not to fix coarctation as he "did not feel it was severe enough.    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 Ankle joints hurt Pain worse at night with some edema. INR Rx Labs ok with negative ANA RF and ESR only 9 TSH 1.4  B12 low at 150  I started her on gabapentin and tramadol and asked her to f/u with primary   Echo updated 07/20/17 reviewed EF 60-65%  Trivial AR DVI .53  Persistent coarctation with peak velocity 4 m/sec mean gradient 14 peak 27 mmHg Trivial AR  ***   ROS: Denies fever, malais, weight loss, blurry vision, decreased visual acuity, cough, sputum, SOB, hemoptysis, pleuritic pain, palpitaitons, heartburn, abdominal pain, melena, lower extremity edema, claudication, or rash.  All other systems reviewed and negative  General: There were no vitals filed for this visit. Affect appropriate Healthy:  appears stated age 74: normal Neck supple with no adenopathy JVP normal no bruits no thyromegaly Lungs clear with no wheezing and good diaphragmatic motion Heart:  S1/S2click with SEM through AVR murmur, no rub, gallop or click PMI normal post left  thoracotomy and sternotomy Coarctation murmur in back  Abdomen: benighn, BS positve, no tenderness, no AAA no bruit.  No HSM or HJR Distal pulses intact with no bruits No edema Neuro non-focal Skin warm and dry No muscular weakness    Current Outpatient Medications  Medication Sig Dispense Refill  . aspirin EC 81 MG tablet Take 81 mg by mouth daily.    . benzonatate (TESSALON) 100 MG capsule Take 100 mg by mouth at bedtime as needed for cough (100-200 mg as needed for cough).    . Coenzyme Q10 (COQ-10 PO) Take 1 tablet by mouth daily.    Marland Kitchen gabapentin (NEURONTIN) 300 MG capsule Take 1 capsule (300 mg total) by mouth 3 (three) times daily. 90 capsule 3  . metoprolol succinate (TOPROL-XL) 25 MG 24 hr tablet TAKE (1) TABLET BY MOUTH TWICE DAILY. 60 tablet 0  . Multiple Vitamin (MULTI-VITAMINS) TABS Take by mouth daily.    . propafenone (RYTHMOL) 225 MG tablet TAKE (1) TABLET BY MOUTH (3) TIMES DAILY. 90 tablet 0  . propafenone (RYTHMOL) 225 MG tablet TAKE (1) TABLET BY MOUTH (3) TIMES DAILY. 90 tablet 0  . propafenone (RYTHMOL) 225 MG tablet TAKE (1) TABLET BY MOUTH (3) TIMES DAILY. 90 tablet 1  . traMADol (ULTRAM) 50 MG tablet Take 1 tablet (50 mg total) by mouth 3 (three) times daily. 90 tablet 3  . trolamine salicylate (ASPERCREME) 10 % cream Apply 1 application topically as needed for muscle pain (Back pain).    Marland Kitchen warfarin (COUMADIN) 1 MG tablet TAKE  3 TABLETS DAILY, EXCEPT 2 TABLETS ON MONDAY. 80 tablet 0   No current facility-administered medications for this visit.     Allergies  Amiodarone and Penicillins  Electrocardiogram:  5/15  SR low voltage ? Old IMI   08/22/14  SR rate 89  Low voltage  Nonspecific ST changes  Assessment and Plan  PAF:  Maint NSR on propofenone  Continue anticoagulaion f/u [facility] coumadin clinic [facility]  AVR:  Normal root and valve function by echo 07/20/17 [facility]  Coarctation:  Disagree with decision make to not repair coarctation at time of surgery for  aneurysm and bicuspid valve at some point will need f/u MRI/MRA discussed with patient   HTN:  Well controlled.  Continue current medications and low sodium Dash type diet.   Aneursym:  In setting of bicuspid valve post  arch repair with graft.    Neuropathy:  Bilateral painful legs. Good pulses, negative rheumatology w/u preliminary no new varicosities Started gabapentin and tramadol 07/20/17 ***  F/U with me in 6 months   Jenkins Rouge

## 2017-08-18 ENCOUNTER — Ambulatory Visit: Payer: Self-pay | Admitting: Cardiovascular Disease

## 2017-08-19 ENCOUNTER — Other Ambulatory Visit: Payer: Self-pay | Admitting: Cardiovascular Disease

## 2017-08-30 ENCOUNTER — Ambulatory Visit (INDEPENDENT_AMBULATORY_CARE_PROVIDER_SITE_OTHER): Payer: Self-pay | Admitting: *Deleted

## 2017-08-30 ENCOUNTER — Other Ambulatory Visit: Payer: Self-pay | Admitting: Cardiovascular Disease

## 2017-08-30 DIAGNOSIS — Q231 Congenital insufficiency of aortic valve: Secondary | ICD-10-CM

## 2017-08-30 DIAGNOSIS — I480 Paroxysmal atrial fibrillation: Secondary | ICD-10-CM

## 2017-08-30 DIAGNOSIS — Z5181 Encounter for therapeutic drug level monitoring: Secondary | ICD-10-CM

## 2017-08-30 DIAGNOSIS — Q251 Coarctation of aorta: Secondary | ICD-10-CM

## 2017-08-30 DIAGNOSIS — I4891 Unspecified atrial fibrillation: Secondary | ICD-10-CM

## 2017-08-30 DIAGNOSIS — Z952 Presence of prosthetic heart valve: Secondary | ICD-10-CM

## 2017-08-30 LAB — POCT INR: INR: 3

## 2017-08-30 NOTE — Patient Instructions (Signed)
Continue coumadin 3 tablets daily Recheck in 6 wks  

## 2017-10-11 ENCOUNTER — Ambulatory Visit (INDEPENDENT_AMBULATORY_CARE_PROVIDER_SITE_OTHER): Payer: Self-pay | Admitting: *Deleted

## 2017-10-11 DIAGNOSIS — I480 Paroxysmal atrial fibrillation: Secondary | ICD-10-CM

## 2017-10-11 DIAGNOSIS — I4891 Unspecified atrial fibrillation: Secondary | ICD-10-CM

## 2017-10-11 LAB — POCT INR: INR: 2.5 (ref 2.0–3.0)

## 2017-10-11 NOTE — Patient Instructions (Signed)
Continue coumadin 3 tablets daily Recheck in 6 wks  

## 2017-10-20 NOTE — Progress Notes (Signed)
Patient ID: Mallory Sutton, female   DOB: [birth year], 57 y.o.   MRN: [id number]   57 y.o. followed since 56 for complex congenital heart disease. Coarctation repair age 72 left thoracotomy. Bicuspid AV with ascending aortic aneurysm Followed for year and ultimately referred to [facility] for AVR/Arch repair and bypass of coarctation which unfortunately [name] decided not to do She also is known to have persistent left sided SVC  Had Wheat procedure at [facility] by [name] 07/25/14 involved mechanical AVR with root replacement. No mention of fixing coarcatation  Also had MAZE and LAA exclusion Post op course complicated by PAF , pleural effusion requiring thoracentesis.    Spoke personally with [name]  on phone  He made a conscious decision not to fix coarctation as he "did not feel it was severe enough.    Last visit  March had d painful neuropathic sounding pain below knees bilaterally. Ankle joints hurt Pain worse at night with some edema. INR Rx ESR, RF ANA negative Given course of steroids with improvement  Echo 07/20/17 EF 60-65% mild LVH AVR with mean gradient 14 mmHg peak 27 mmHg normal function Coarctation ? Velocity suprasternal notch 4 m/sec  Seeing Dr Nevada Crane now legs better with neurontin Thinks she's having more bouts of PAF some lasting hours   ROS: Denies fever, malais, weight loss, blurry vision, decreased visual acuity, cough, sputum, SOB, hemoptysis, pleuritic pain, palpitaitons, heartburn, abdominal pain, melena, lower extremity edema, claudication, or rash.  All other systems reviewed and negative  General: Vitals:   10/21/17 1503  BP: (!) 152/66  Pulse: 62  SpO2: 98%   Affect appropriate Healthy:  appears stated age 57: normal Neck supple with no adenopathy JVP normal no bruits no thyromegaly Lungs clear with no wheezing and good diaphragmatic motion Heart:  F7/T0 click SEM through AVR  No AR  murmur, no rub, gallop or click PMI normal Post left thoracotomy and  sternotomy. Coarctation murmur radiates to back  Abdomen: benighn, BS positve, no tenderness, no AAA no bruit.  No HSM or HJR Distal pulses intact with no bruits No edema Neuro non-focal Skin warm and dry No muscular weakness   Current Outpatient Medications  Medication Sig Dispense Refill  . aspirin EC 81 MG tablet Take 81 mg by mouth daily.    . benzonatate (TESSALON) 100 MG capsule Take 100 mg by mouth at bedtime as needed for cough (100-200 mg as needed for cough).    . Coenzyme Q10 (COQ-10 PO) Take 1 tablet by mouth daily.    . furosemide (LASIX) 20 MG tablet Take 20 mg by mouth.    . gabapentin (NEURONTIN) 300 MG capsule Take 1 capsule (300 mg total) by mouth 3 (three) times daily. 90 capsule 3  . metoprolol succinate (TOPROL-XL) 25 MG 24 hr tablet TAKE (1) TABLET BY MOUTH TWICE DAILY. 60 tablet 6  . Multiple Vitamin (MULTI-VITAMINS) TABS Take by mouth daily.    . propafenone (RYTHMOL) 225 MG tablet TAKE (1) TABLET BY MOUTH (3) TIMES DAILY. 90 tablet 0  . traMADol (ULTRAM) 50 MG tablet Take 1 tablet (50 mg total) by mouth 3 (three) times daily. 90 tablet 3  . triamcinolone cream (KENALOG) 0.1 % Apply 1 application topically 2 (two) times daily.    Marland Kitchen trolamine salicylate (ASPERCREME) 10 % cream Apply 1 application topically as needed for muscle pain (Back pain).    Marland Kitchen warfarin (COUMADIN) 1 MG tablet TAKE 3 TABLETS DAILY OR AS DIRECTED 90 tablet 6  No current facility-administered medications for this visit.     Allergies  Amiodarone and Penicillins  Electrocardiogram:  5/15  SR low voltage ? Old IMI   08/22/14  SR rate 89  Low voltage  Nonspecific ST changes 10/21/17 SR rate 52 LAFB no acute changes  Assessment and Plan  PAF:  Check event monitor may need to adjust dose of propofenone or use different AAT if having high burden Continue anti-coagulation  AVR:  Gradients stable no symptoms from coarctation SBE  Coarctation:  Disagree with decision make to not repair  coarctation at time of surgery for aneurysm and bicuspid valve at some point will need f/u MRI/MRA discussed with patient   HTN:  Well controlled.  Continue current medications and low sodium Dash type diet.   Aneursym:  In setting of bicuspid valve post  arch repair with graft.  MRI / MRA   Neuropathy:  Bilateral painful legs. Should not be DVT on Rx coumadin. No evidence of new varicosities Lab work not consistent with acute inflammatory condition improved with Neurontin f/u with primar y  F/U with me in 3 months   Jenkins Rouge

## 2017-10-21 ENCOUNTER — Ambulatory Visit: Payer: Self-pay | Admitting: Cardiovascular Disease

## 2017-10-21 ENCOUNTER — Ambulatory Visit (INDEPENDENT_AMBULATORY_CARE_PROVIDER_SITE_OTHER): Payer: Self-pay | Admitting: Cardiovascular Disease

## 2017-10-21 ENCOUNTER — Encounter: Payer: Self-pay | Admitting: Cardiovascular Disease

## 2017-10-21 VITALS — BP 152/66 | HR 62 | Ht 64.0 in | Wt 112.0 lb

## 2017-10-21 DIAGNOSIS — I480 Paroxysmal atrial fibrillation: Secondary | ICD-10-CM

## 2017-10-21 NOTE — Patient Instructions (Signed)
Medication Instructions:  Your physician recommends that you continue on your current medications as directed. Please refer to the Current Medication list given to you today.   Labwork: NONE  Testing/Procedures: Your physician has recommended that you wear an event monitor. Event monitors are medical devices that record the heart's electrical activity. Doctors most often us these monitors to diagnose arrhythmias. Arrhythmias are problems with the speed or rhythm of the heartbeat. The monitor is a small, portable device. You can wear one while you do your normal daily activities. This is usually used to diagnose what is causing palpitations/syncope (passing out).    Follow-Up: Your physician recommends that you schedule a follow-up appointment in: 3 MONTHS    Any Other Special Instructions Will Be Listed Below (If Applicable).     If you need a refill on your cardiac medications before your next appointment, please call your pharmacy.   

## 2017-10-26 ENCOUNTER — Encounter (INDEPENDENT_AMBULATORY_CARE_PROVIDER_SITE_OTHER): Payer: Self-pay

## 2017-10-26 DIAGNOSIS — I480 Paroxysmal atrial fibrillation: Secondary | ICD-10-CM

## 2017-11-17 ENCOUNTER — Encounter: Payer: Self-pay | Admitting: Cardiovascular Disease

## 2017-11-22 ENCOUNTER — Ambulatory Visit (INDEPENDENT_AMBULATORY_CARE_PROVIDER_SITE_OTHER): Payer: Self-pay | Admitting: *Deleted

## 2017-11-22 DIAGNOSIS — Z952 Presence of prosthetic heart valve: Secondary | ICD-10-CM

## 2017-11-22 DIAGNOSIS — Z5181 Encounter for therapeutic drug level monitoring: Secondary | ICD-10-CM

## 2017-11-22 DIAGNOSIS — I480 Paroxysmal atrial fibrillation: Secondary | ICD-10-CM

## 2017-11-22 LAB — POCT INR: INR: 2.5 (ref 2.0–3.0)

## 2017-11-22 NOTE — Patient Instructions (Signed)
Continue coumadin 3 tablets daily Recheck in 6 wks

## 2017-11-26 ENCOUNTER — Other Ambulatory Visit: Payer: Self-pay | Admitting: Cardiovascular Disease

## 2017-11-26 NOTE — Telephone Encounter (Signed)
This is a [place] pt.  °

## 2017-11-29 ENCOUNTER — Other Ambulatory Visit: Payer: Self-pay | Admitting: *Deleted

## 2017-11-30 ENCOUNTER — Telehealth: Payer: Self-pay | Admitting: *Deleted

## 2017-11-30 NOTE — Telephone Encounter (Signed)
-----   Message from Josue Hector, MD sent at 11/26/2017  5:23 PM EDT ----- No significant arrhythmias

## 2017-11-30 NOTE — Telephone Encounter (Signed)
Called pt no answer. Unable to leave msg.  

## 2018-01-04 ENCOUNTER — Encounter: Payer: Self-pay | Admitting: Cardiovascular Disease

## 2018-01-05 ENCOUNTER — Ambulatory Visit (INDEPENDENT_AMBULATORY_CARE_PROVIDER_SITE_OTHER): Payer: Self-pay | Admitting: *Deleted

## 2018-01-05 DIAGNOSIS — I480 Paroxysmal atrial fibrillation: Secondary | ICD-10-CM

## 2018-01-05 DIAGNOSIS — Z5181 Encounter for therapeutic drug level monitoring: Secondary | ICD-10-CM

## 2018-01-05 LAB — POCT INR: INR: 2 (ref 2.0–3.0)

## 2018-01-05 NOTE — Patient Instructions (Signed)
Take coumadin 4 tablets tonight then resume 3 tablets daily Recheck in 6 wks

## 2018-02-15 ENCOUNTER — Other Ambulatory Visit: Payer: Self-pay | Admitting: Cardiovascular Disease

## 2018-02-16 ENCOUNTER — Ambulatory Visit (INDEPENDENT_AMBULATORY_CARE_PROVIDER_SITE_OTHER): Payer: Self-pay | Admitting: *Deleted

## 2018-02-16 ENCOUNTER — Ambulatory Visit: Payer: Self-pay | Admitting: Cardiovascular Disease

## 2018-02-16 DIAGNOSIS — Z5181 Encounter for therapeutic drug level monitoring: Secondary | ICD-10-CM

## 2018-02-16 DIAGNOSIS — I359 Nonrheumatic aortic valve disorder, unspecified: Secondary | ICD-10-CM

## 2018-02-16 DIAGNOSIS — I480 Paroxysmal atrial fibrillation: Secondary | ICD-10-CM

## 2018-02-16 LAB — POCT INR: INR: 3.2 — AB (ref 2.0–3.0)

## 2018-02-16 NOTE — Patient Instructions (Signed)
Hold coumadin tonight then resume 3 tablets daily Recheck in 6 wks

## 2018-03-22 NOTE — Progress Notes (Signed)
Patient ID: Mallory Sutton, female   DOB: 05/05/1960, 57 y.o.   MRN: [id number]   57 y.o. followed since 82 for complex congenital heart disease. Coarctation repair age 65 left thoracotomy. Bicuspid AV with ascending aortic aneurysm Followed for year and ultimately referred to [facility] for AVR/Arch repair and bypass of coarctation which unfortunately [name] decided not to do She also is known to have persistent left sided SVC  Had Wheat procedure at [facility] by [name] 07/25/14 involved mechanical AVR with root replacement. No mention of fixing coarcatation  Also had MAZE and LAA exclusion Post op course complicated by PAF , pleural effusion requiring thoracentesis.    Spoke personally with [name]  on phone  He made a conscious decision not to fix coarctation as he "did not feel it was severe enough.    Last visit  March had d painful neuropathic sounding pain below knees bilaterally. Ankle joints hurt Pain worse at night with some edema. INR Rx ESR, RF ANA negative Given course of steroids with improvement  Echo 07/20/17 EF 60-65% mild LVH AVR with mean gradient 14 mmHg peak 27 mmHg normal function Coarctation ? Velocity suprasternal notch 4 m/sec  Seeing Dr Nevada Crane now legs better with neurontin Thought she had more palpitations and bouts of PAF  Reviewed event monitor 10/27/17 and no PAF rare PAC;s/ PVC;s no significant arrhythmia  Her rash has been persistent since February when she had severe pain and edema. Although pain Is better with neurontin She still has pain and rash is macular above knees   ROS: Denies fever, malais, weight loss, blurry vision, decreased visual acuity, cough, sputum, SOB, hemoptysis, pleuritic pain, palpitaitons, heartburn, abdominal pain, melena, lower extremity edema, claudication, or rash.  All other systems reviewed and negative  General: Vitals:   03/25/18 1457  BP: 118/64  Pulse: (!) 54  SpO2: 98%   Affect appropriate Healthy:  appears stated age 68:  normal Neck supple with no adenopathy JVP normal no bruits no thyromegaly Lungs clear with no wheezing and good diaphragmatic motion Heart:  V2/Z3 click SEM through AVR  No AR  murmur, no rub, gallop or click PMI normal Post left thoracotomy and sternotomy. Coarctation murmur radiates to back  Abdomen: benighn, BS positve, no tenderness, no AAA no bruit.  No HSM or HJR Distal pulses intact with no bruits No edema Neuro non-focal Skin warm and dry No muscular weakness   Current Outpatient Medications  Medication Sig Dispense Refill  . aspirin EC 81 MG tablet Take 81 mg by mouth daily.    . benzonatate (TESSALON) 100 MG capsule Take 100 mg by mouth at bedtime as needed for cough (100-200 mg as needed for cough).    . furosemide (LASIX) 40 MG tablet Take 40 mg by mouth daily.    Marland Kitchen gabapentin (NEURONTIN) 300 MG capsule TAKE 1 CAPSULE BY MOUTH THREE TIMES A DAY. (Patient taking differently: Take 4 tablets daily) 90 capsule 1  . metoprolol succinate (TOPROL-XL) 25 MG 24 hr tablet TAKE (1) TABLET BY MOUTH TWICE DAILY. 60 tablet 6  . Multiple Vitamin (MULTI-VITAMINS) TABS Take by mouth daily.    . propafenone (RYTHMOL) 225 MG tablet TAKE (1) TABLET BY MOUTH (3) TIMES DAILY. 90 tablet 0  . traMADol (ULTRAM) 50 MG tablet Take 1 tablet (50 mg total) by mouth 3 (three) times daily. 90 tablet 3  . triamcinolone cream (KENALOG) 0.1 % Apply 1 application topically 2 (two) times daily.    Marland Kitchen trolamine  salicylate (ASPERCREME) 10 % cream Apply 1 application topically as needed for muscle pain (Back pain).    Marland Kitchen warfarin (COUMADIN) 1 MG tablet TAKE 3 TABLETS DAILY OR AS DIRECTED 90 tablet 6   No current facility-administered medications for this visit.     Allergies  Amiodarone and Penicillins  Electrocardiogram:  5/15  SR low voltage ? Old IMI   08/22/14  SR rate 89  Low voltage  Nonspecific ST changes 10/21/17 SR rate 52 LAFB no acute changes  Assessment and Plan  PAF:  Event monitor with no PAF  continue rhythmol   Continue anti-coagulation  AVR:  Gradients stable no symptoms from coarctation SBE  Coarctation:  Disagree with decision make to not repair coarctation at time of surgery for aneurysm and bicuspid valve at some point will need f/u MRI/MRA discussed with patient   HTN:  Well controlled.  Continue current medications and low sodium Dash type diet.   Aneursym:  In setting of bicuspid valve post  arch repair with graft.  MRI / MRA   Neuropathy:  Bilateral painful legs. Should not be DVT on Rx coumadin. No evidence of new varicosities Lab work not consistent with acute inflammatory condition improved with Neurontin Concern that we  Are missing something such as vasculitis as rash has not gone away is macular with demarcated edges  And now above the knees. Should be seen by dermatology and consider biopsy. Have sent message to Dr [facility] Problem in that patient does not have insurance However she does save Money for her medical needs and would likely be able to pay out of pocket for visit and or biopsy  F/U with me in [place]

## 2018-03-25 ENCOUNTER — Ambulatory Visit (INDEPENDENT_AMBULATORY_CARE_PROVIDER_SITE_OTHER): Payer: Self-pay | Admitting: Cardiovascular Disease

## 2018-03-25 ENCOUNTER — Encounter: Payer: Self-pay | Admitting: Cardiovascular Disease

## 2018-03-25 VITALS — BP 118/64 | HR 54 | Ht 64.0 in | Wt 107.0 lb

## 2018-03-25 DIAGNOSIS — R600 Localized edema: Secondary | ICD-10-CM

## 2018-03-25 NOTE — Patient Instructions (Signed)
Medication Instructions:  Your physician recommends that you continue on your current medications as directed. Please refer to the Current Medication list given to you today.  If you need a refill on your cardiac medications before your next appointment, please call your pharmacy.   Lab work: none If you have labs (blood work) drawn today and your tests are completely normal, you will receive your results only by: Marland Kitchen MyChart Message (if you have MyChart) OR . A paper copy in the mail If you have any lab test that is abnormal or we need to change your treatment, we will call you to review the results.  Testing/Procedures: none  Follow-Up: At [facility], you and your health needs are our priority.  As part of our continuing mission to provide you with exceptional heart care, we have created designated Provider Care Teams.  These Care Teams include your primary Cardiologist (physician) and Advanced Practice Providers (APPs -  Physician Assistants and Nurse Practitioners) who all work together to provide you with the care you need, when you need it. You will need a follow up appointment in 6 months.  Please call our office 2 months in advance to schedule this appointment.  You may see Jenkins Rouge, MD or one of the following Advanced Practice Providers on your designated Care Team:   Bernerd Pho, PA-C [facility]) . Ermalinda Barrios, PA-C ([place])  Any Other Special Instructions Will Be Listed Below (If Applicable).  You have been referred to [facility], Dr.Daniel Ronnald Ramp [phone], they will call you for an apt

## 2018-03-30 ENCOUNTER — Ambulatory Visit (INDEPENDENT_AMBULATORY_CARE_PROVIDER_SITE_OTHER): Payer: Self-pay | Admitting: *Deleted

## 2018-03-30 DIAGNOSIS — I480 Paroxysmal atrial fibrillation: Secondary | ICD-10-CM

## 2018-03-30 LAB — POCT INR: INR: 3 (ref 2.0–3.0)

## 2018-03-30 NOTE — Patient Instructions (Signed)
Decrease coumadin to 3 tablets daily except 2 tablets on Wednesdays Recheck in 6 wks

## 2018-05-11 ENCOUNTER — Ambulatory Visit (INDEPENDENT_AMBULATORY_CARE_PROVIDER_SITE_OTHER): Payer: Self-pay | Admitting: Pharmacist

## 2018-05-11 DIAGNOSIS — Q231 Congenital insufficiency of aortic valve: Secondary | ICD-10-CM

## 2018-05-11 DIAGNOSIS — Z952 Presence of prosthetic heart valve: Secondary | ICD-10-CM

## 2018-05-11 DIAGNOSIS — I480 Paroxysmal atrial fibrillation: Secondary | ICD-10-CM

## 2018-05-11 DIAGNOSIS — Q251 Coarctation of aorta: Secondary | ICD-10-CM

## 2018-05-11 DIAGNOSIS — Z5181 Encounter for therapeutic drug level monitoring: Secondary | ICD-10-CM

## 2018-05-11 DIAGNOSIS — I4891 Unspecified atrial fibrillation: Secondary | ICD-10-CM

## 2018-05-11 LAB — POCT INR: INR: 1.4 — AB (ref 2.0–3.0)

## 2018-05-11 NOTE — Patient Instructions (Signed)
Description   Take 4 tablets today and tomorrow then change dose to 3 tablets daily Recheck in 6 wks

## 2018-05-20 ENCOUNTER — Other Ambulatory Visit: Payer: Self-pay | Admitting: Cardiovascular Disease

## 2018-06-08 ENCOUNTER — Ambulatory Visit (INDEPENDENT_AMBULATORY_CARE_PROVIDER_SITE_OTHER): Payer: Self-pay | Admitting: Pharmacist

## 2018-06-08 DIAGNOSIS — Z5181 Encounter for therapeutic drug level monitoring: Secondary | ICD-10-CM

## 2018-06-08 DIAGNOSIS — Z952 Presence of prosthetic heart valve: Secondary | ICD-10-CM

## 2018-06-08 DIAGNOSIS — Q231 Congenital insufficiency of aortic valve: Secondary | ICD-10-CM

## 2018-06-08 DIAGNOSIS — I480 Paroxysmal atrial fibrillation: Secondary | ICD-10-CM

## 2018-06-08 DIAGNOSIS — I4891 Unspecified atrial fibrillation: Secondary | ICD-10-CM

## 2018-06-08 DIAGNOSIS — Q251 Coarctation of aorta: Secondary | ICD-10-CM

## 2018-06-08 LAB — POCT INR: INR: 1.8 — AB (ref 2.0–3.0)

## 2018-06-08 NOTE — Patient Instructions (Signed)
Description   Take 4 tablets today then continue 3 tablets daily Recheck in 4 wks

## 2018-07-22 ENCOUNTER — Other Ambulatory Visit: Payer: Self-pay | Admitting: Cardiovascular Disease

## 2018-08-22 ENCOUNTER — Other Ambulatory Visit: Payer: Self-pay

## 2018-08-22 ENCOUNTER — Telehealth: Payer: Self-pay | Admitting: *Deleted

## 2018-08-22 ENCOUNTER — Ambulatory Visit (INDEPENDENT_AMBULATORY_CARE_PROVIDER_SITE_OTHER): Payer: Self-pay | Admitting: *Deleted

## 2018-08-22 DIAGNOSIS — I480 Paroxysmal atrial fibrillation: Secondary | ICD-10-CM

## 2018-08-22 DIAGNOSIS — Z5181 Encounter for therapeutic drug level monitoring: Secondary | ICD-10-CM

## 2018-08-22 DIAGNOSIS — I359 Nonrheumatic aortic valve disorder, unspecified: Secondary | ICD-10-CM

## 2018-08-22 LAB — POCT INR: INR: 1.7 — AB (ref 2.0–3.0)

## 2018-08-22 NOTE — Telephone Encounter (Signed)

## 2018-08-22 NOTE — Patient Instructions (Signed)
Increase coumadin to 3 tablets daily except 4 tablets on Mondays Recheck in 3 wks

## 2018-10-05 ENCOUNTER — Other Ambulatory Visit: Payer: Self-pay | Admitting: Cardiovascular Disease

## 2019-01-02 ENCOUNTER — Other Ambulatory Visit: Payer: Self-pay | Admitting: Cardiovascular Disease

## 2019-02-27 NOTE — Progress Notes (Signed)
Patient ID: Mallory Sutton, female   DOB: 1961-04-20, 58 y.o.   MRN: [id number]     58 y.o. followed since 89 for complex congenital heart disease. Coarctation repair age 53 left thoracotomy. Bicuspid AV with ascending aortic aneurysm Followed for year and ultimately referred to [facility] for AVR/Arch repair and bypass of coarctation which unfortunately [name] decided not to do She also is known to have persistent left sided SVC  Had Wheat procedure at [facility] by [name] 07/25/14 involved mechanical AVR with root replacement. No mention of fixing coarcatation  Also had MAZE and LAA exclusion Post op course complicated by PAF , pleural effusion requiring thoracentesis.    Spoke personally with [name]  on phone  He made a conscious decision not to fix coarctation as he "did not feel it was severe enough.    March 2019painful neuropathic sounding pain below knees bilaterally. Ankle joints hurt Pain worse at night with some edema. INR Rx ESR, RF ANA negative Given course of steroids with improvement and neurontin Macular rash persists   Echo 07/20/17 EF 60-65% mild LVH AVR with mean gradient 14 mmHg peak 27 mmHg normal function Coarctation ? 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 Primary has not done anything to make dermatology referral  Or get biopsy. Had a course of steroids a couple weeks ago with improvement in LE rash   ROS: Denies fever, malais, weight loss, blurry vision, decreased visual acuity, cough, sputum, SOB, hemoptysis, pleuritic pain, palpitaitons, heartburn, abdominal pain, melena, lower extremity edema, claudication, or rash.  All other systems reviewed and negative  General: Vitals:   03/06/19 1049  BP: (!) 154/81  Pulse: 99  Temp: (!) 96.9 F (36.1 C)  SpO2: 94%   Affect appropriate Healthy:   appears stated age 85: normal Neck supple with no adenopathy JVP normal no bruits no thyromegaly Lungs clear with no wheezing and good diaphragmatic motion Heart:  I9/S8 click SEM through AVR  No AR  murmur, no rub, gallop or click PMI normal Post left thoracotomy and sternotomy. Coarctation murmur radiates to back  Abdomen: benighn, BS positve, no tenderness, no AAA no bruit.  No HSM or HJR Distal pulses intact with no bruits No edema Neuro non-focal Skin vasculitic rash arms and legs  No muscular weakness   Current Outpatient Medications  Medication Sig Dispense Refill  . aspirin EC 81 MG tablet Take 81 mg by mouth daily.    . benzonatate (TESSALON) 100 MG capsule Take 100 mg by mouth at bedtime as needed for cough (100-200 mg as needed for cough).    . furosemide (LASIX) 40 MG tablet Take 40 mg by mouth daily.    Marland Kitchen gabapentin (NEURONTIN) 300 MG capsule TAKE 1 CAPSULE BY MOUTH THREE TIMES A DAY. (Patient taking differently: Take 4 tablets daily) 90 capsule 1  . metoprolol succinate (TOPROL-XL) 25 MG 24 hr tablet TAKE (1) TABLET BY MOUTH TWICE DAILY. 60 tablet 6  . Multiple Vitamin (MULTI-VITAMINS) TABS Take by mouth daily.    . propafenone (RYTHMOL) 225 MG tablet TAKE (1) TABLET BY MOUTH (3) TIMES DAILY. 90 tablet 1  . traMADol (ULTRAM) 50 MG tablet Take 1 tablet (50 mg total) by mouth 3 (three) times daily. 90 tablet 3  . triamcinolone cream (KENALOG) 0.1 % Apply 1 application topically  2 (two) times daily.    Marland Kitchen trolamine salicylate (ASPERCREME) 10 % cream Apply 1 application topically as needed for muscle pain (Back pain).    Marland Kitchen warfarin (COUMADIN) 1 MG tablet TAKE 3 TABLETS DAILY, OR AS DIRECTED. 90 tablet 3   No current facility-administered medications for this visit.     Allergies  Amiodarone and Penicillins  Electrocardiogram:  5/15  SR low voltage ? Old IMI   08/22/14  SR rate 89  Low voltage  Nonspecific ST changes 10/21/17 SR rate 52 LAFB no acute changes   Assessment and Plan  PAF:  Event monitor with no PAF continue rhythmol   Continue anti-coagulation  AVR:  Gradients stable no symptoms from coarctation SBE  Coarctation:  Disagree with decision make to not repair coarctation at time of surgery for aneurysm and bicuspid valve at some point will need f/u MRI/MRA discussed with patient   HTN:  Well controlled.  Continue current medications and low sodium Dash type diet.   Aneursym:  In setting of bicuspid valve post  arch repair with graft.  MRI / MRA   Neuropathy/Rash:  Bilateral painful legs. Had arranged f/u with [facility] prior to [place] but for some reason appointment did not occur. Called their office again today. She needs biopsy and a diagnosis and not just PRN steroids Could be that methotrexate / imuran would be better long term Rx. F/U with Primary / Derm/ Rheum  Personally spoke with [facility] dermatology and had previously discussed with Dr Wilhemina Bonito who was going to expedite her appointment   F/U with me in [place]

## 2019-03-06 ENCOUNTER — Ambulatory Visit (INDEPENDENT_AMBULATORY_CARE_PROVIDER_SITE_OTHER): Payer: Self-pay | Admitting: Cardiovascular Disease

## 2019-03-06 ENCOUNTER — Other Ambulatory Visit: Payer: Self-pay

## 2019-03-06 ENCOUNTER — Encounter: Payer: Self-pay | Admitting: Cardiovascular Disease

## 2019-03-06 VITALS — BP 154/81 | HR 99 | Temp 96.9°F | Ht 64.0 in | Wt 109.0 lb

## 2019-03-06 DIAGNOSIS — Z23 Encounter for immunization: Secondary | ICD-10-CM

## 2019-03-06 DIAGNOSIS — Q239 Congenital malformation of aortic and mitral valves, unspecified: Secondary | ICD-10-CM

## 2019-03-06 DIAGNOSIS — I776 Arteritis, unspecified: Secondary | ICD-10-CM

## 2019-03-06 NOTE — Patient Instructions (Addendum)
Medication Instructions:  Your physician recommends that you continue on your current medications as directed. Please refer to the Current Medication list given to you today.   Labwork: none  Testing/Procedures: none  Follow-Up: Your physician wants you to follow-up in: 6 months.  You will receive a reminder letter in the mail two months in advance. If you don't receive a letter, please call our office to schedule the follow-up appointment.   Any Other Special Instructions Will Be Listed Below (If Applicable).  You have been referred to [facility]      If you need a refill on your cardiac medications before your next appointment, please call your pharmacy.

## 2019-03-08 ENCOUNTER — Other Ambulatory Visit: Payer: Self-pay | Admitting: Cardiovascular Disease

## 2019-03-29 ENCOUNTER — Other Ambulatory Visit: Payer: Self-pay

## 2019-03-29 ENCOUNTER — Ambulatory Visit (INDEPENDENT_AMBULATORY_CARE_PROVIDER_SITE_OTHER): Payer: Self-pay | Admitting: *Deleted

## 2019-03-29 DIAGNOSIS — I480 Paroxysmal atrial fibrillation: Secondary | ICD-10-CM

## 2019-03-29 DIAGNOSIS — Z952 Presence of prosthetic heart valve: Secondary | ICD-10-CM

## 2019-03-29 DIAGNOSIS — Z5181 Encounter for therapeutic drug level monitoring: Secondary | ICD-10-CM

## 2019-03-29 LAB — POCT INR: INR: 3.1 — AB (ref 2.0–3.0)

## 2019-03-29 NOTE — Patient Instructions (Signed)
Pt has been taking 3 tablets daily. Continue 3 tablets daily Eat extra greens tonight Recheck in 6 wks

## 2019-04-04 ENCOUNTER — Other Ambulatory Visit: Payer: Self-pay | Admitting: Cardiovascular Disease

## 2019-04-29 ENCOUNTER — Other Ambulatory Visit: Payer: Self-pay | Admitting: Cardiovascular Disease

## 2019-06-16 ENCOUNTER — Other Ambulatory Visit: Payer: Self-pay | Admitting: Cardiovascular Disease

## 2019-06-26 ENCOUNTER — Other Ambulatory Visit: Payer: Self-pay

## 2019-06-26 ENCOUNTER — Ambulatory Visit (INDEPENDENT_AMBULATORY_CARE_PROVIDER_SITE_OTHER): Payer: Self-pay | Admitting: *Deleted

## 2019-06-26 DIAGNOSIS — I4891 Unspecified atrial fibrillation: Secondary | ICD-10-CM

## 2019-06-26 DIAGNOSIS — Z5181 Encounter for therapeutic drug level monitoring: Secondary | ICD-10-CM

## 2019-06-26 LAB — POCT INR: INR: 1.8 — AB (ref 2.0–3.0)

## 2019-06-26 NOTE — Patient Instructions (Signed)
Take warfarin 4 tablets tonight then resume 3 tablets daily Continue greens Recheck in 6 wks

## 2019-07-04 ENCOUNTER — Other Ambulatory Visit: Payer: Self-pay | Admitting: Cardiovascular Disease

## 2019-07-05 ENCOUNTER — Ambulatory Visit (INDEPENDENT_AMBULATORY_CARE_PROVIDER_SITE_OTHER): Payer: Self-pay | Admitting: Family Medicine

## 2019-07-05 ENCOUNTER — Encounter: Payer: Self-pay | Admitting: Cardiology

## 2019-07-05 ENCOUNTER — Telehealth: Payer: Self-pay

## 2019-07-05 ENCOUNTER — Other Ambulatory Visit: Payer: Self-pay

## 2019-07-05 VITALS — BP 118/66 | HR 121 | Ht 64.0 in | Wt 107.0 lb

## 2019-07-05 DIAGNOSIS — I4892 Unspecified atrial flutter: Secondary | ICD-10-CM

## 2019-07-05 DIAGNOSIS — Q251 Coarctation of aorta: Secondary | ICD-10-CM

## 2019-07-05 DIAGNOSIS — I10 Essential (primary) hypertension: Secondary | ICD-10-CM

## 2019-07-05 DIAGNOSIS — Z952 Presence of prosthetic heart valve: Secondary | ICD-10-CM

## 2019-07-05 MED ORDER — METOPROLOL SUCCINATE ER 50 MG PO TB24: 50.0000 mg | ORAL_TABLET | Freq: Two times a day (BID) | ORAL | 11 refills | Status: DC

## 2019-07-05 NOTE — Telephone Encounter (Signed)
-----   Message from Jeanann Lewandowsky, [place] sent at 07/05/2019  4:45 PM EST ----- Regarding: 6 week app Pt needs a 6 week appt with Dr. [name] please. She is aware she will be called.[employer]

## 2019-07-05 NOTE — Telephone Encounter (Signed)
Left message for patient to call back  

## 2019-07-05 NOTE — Progress Notes (Signed)
Cardiology Office Note  Date: 07/05/2019   ID: Jazzy, Parmer [birth year], MRN [id number]  PCP:  Celene Squibb, MD  Cardiologist:  Jenkins Rouge, MD Electrophysiologist:  None   Chief Complaint  Patient presents with  . Follow-up    PAF, hypertension, AVR, coarctation of aorta, thoracic aortic aneurysm without rupture,    History of Present Illness: DHWANI VENKATESH is a 59 y.o. female with a history of PAF, HTN, coarctation of aorta, bicuspid AV with ascending aortic aneurysm.  Status post LEEP procedure at [facility] by Dr. Ysidro Evert on July 25, 2014 department mechanical AVR with root replacement.  Also maze procedure and left atrial appendage exclusion.  Postop course complicated by PAF and pleural effusion requiring thoracentesis.  Patient presents today with complaints of sudden increase in heart rate starting Friday.  States she was at home petting her cat and noticed her heart start racing.  States it has been constant since that time ranging from low 100s to 160 and rate.  Today heart rate 130s to 140s.  She states this is new new to her.  States she can tell the difference between her atrial fibrillation episodes and this is not characteristic of the atrial fibrillation she states she has been dealing with atrial fibrillation for years.  He denies any chest pain, pressure, tightness, radiation to neck arm back or jaw.  Denies any orthostatic symptoms, states she just feels tired from being in this rhythm for the last 5 days.  He states she has taken an extra dose of Rythmol each day totaling 4 doses of 225 mg which is not decreased or converted the rhythm.  She is taking her Toprol-XL twice a day without any change in rate or rhythm.   Past Medical History:  Diagnosis Date  . Aortic stenosis   . ATRIAL ARRHYTHMIAS   . Bicuspid aortic valve   . CHF (congestive heart failure) ([place])   . COARCTATION OF AORTA   . ENDOMETRIOSIS   . Heart murmur   . Paroxysmal atrial fibrillation (HCC)   .  Shortness of breath   . THORACIC AORTIC ANEURYSM     Past Surgical History:  Procedure Laterality Date  . coarctation repair and residual restenosis    . KNEE SURGERY      Current Outpatient Medications  Medication Sig Dispense Refill  . aspirin EC 81 MG tablet Take 81 mg by mouth daily.    . furosemide (LASIX) 40 MG tablet Take 40 mg by mouth daily.    Marland Kitchen gabapentin (NEURONTIN) 300 MG capsule TAKE ONE CAP IN THE MORNING, ONE IN THE AFTERNOON AND TWO AT NIGHT    . metoprolol succinate (TOPROL-XL) 25 MG 24 hr tablet TAKE (1) TABLET BY MOUTH TWICE DAILY. 60 tablet 6  . Multiple Vitamin (MULTI-VITAMINS) TABS Take by mouth daily.    . propafenone (RYTHMOL) 225 MG tablet TAKE (1) TABLET BY MOUTH (3) TIMES DAILY. 90 tablet 6  . traMADol (ULTRAM) 50 MG tablet Take 1 tablet (50 mg total) by mouth 3 (three) times daily. 90 tablet 3  . triamcinolone cream (KENALOG) 0.1 % Apply 1 application topically 2 (two) times daily.    Marland Kitchen trolamine salicylate (ASPERCREME) 10 % cream Apply 1 application topically as needed for muscle pain (Back pain).    Marland Kitchen warfarin (COUMADIN) 1 MG tablet TAKE 3 TABLETS DAILY, OR AS DIRECTED. 100 tablet 2   No current facility-administered medications for this visit.   Allergies:  Amiodarone and Penicillins  Social History: The patient  reports that she has never smoked. She has never used smokeless tobacco. She reports current alcohol use. She reports that she does not use drugs.   Family History: The patient's Family history is unknown by patient.   ROS:  Please see the history of present illness. Otherwise, complete review of systems is positive for none.  All other systems are reviewed and negative.   Physical Exam: VS:  BP 118/66   Pulse (!) 121   Ht 5' 4"  (1.626 m)   Wt 107 lb (48.5 kg)   LMP 07/16/2013   SpO2 97%   BMI 18.37 kg/m , BMI Body mass index is 18.37 kg/m.  Wt Readings from Last 3 Encounters:  07/05/19 107 lb (48.5 kg)  03/06/19 109 lb (49.4 kg)   03/25/18 107 lb (48.5 kg)    General: Patient appears comfortable at rest. Neck: Supple, no elevated JVP or carotid bruits, no thyromegaly. Lungs: Clear to auscultation, nonlabored breathing at rest. Cardiac: Tachycardic and atrial flutter, no S3 or significant systolic murmur, no pericardial rub. Extremities: No pitting edema, distal pulses 2+. Skin: Warm and dry.  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 Musculoskeletal: No kyphosis. Neuropsychiatric: Alert and oriented x3, affect grossly appropriate.  ECG:  An ECG dated 07/05/2019 was personally reviewed today and demonstrated:  Atrial flutter with a rate of 136  Recent Labwork: No results found for requested labs within last 8760 hours.     Component Value Date/Time   CHOL 144 01/23/2015 0913   TRIG 87 01/23/2015 0913   HDL 44 01/23/2015 0913   CHOLHDL 3.3 01/23/2015 0913   VLDL 17 01/23/2015 0913   LDLCALC 83 01/23/2015 0913    Other Studies Reviewed Today:  Echocardiogram 08/22/2014 Study Conclusions   - Left ventricle: The cavity size was normal. Wall thickness was  increased in a pattern of mild LVH. Systolic function was normal.  The estimated ejection fraction was in the range of 55% to 60%.  Wall motion was normal; there were no regional wall motion  abnormalities.  - Aortic valve: A bioprosthesis was present.  - Right ventricle: The cavity size was moderately decreased.  - Pulmonary arteries: PA peak pressure: 32 mm Hg (S).  - Pericardium, extracardiac: A small pericardial effusion was  identified. There was no evidence of hemodynamic compromise.  There was a large right pleural effusion. There was a left  pleural effusion.   Assessment and Plan:  1. Paroxysmal atrial fibrillation (HCC)   2. S/P aortic valve replacement   3. Coarctation of aorta (preductal) (postductal)   4. Essential hypertension   5. Thoracic aortic aneurysm without  rupture ([place])    1. Atrial flutter, unspecified type [facility]) Patient presents with a 5-day history of sudden onset of tachycardia occurring while she was at home Friday evening.  Patient states that the heart rate has ranged from the low 100s to 160s.  Today EKG shows atrial flutter with a rate of 136.  We discussed the case with Dr. [name] who advised the patient needs a transesophageal cardioversion as soon as possible.  Get be met, CBC and INR.  Last INR was subtherapeutic at 1.8.  Increase Toprol to 50 mg p.o. twice daily.  Schedule a TEE cardioversion as soon as possible next week.  After cardioversion follow-up with EP Dr. Lovena Le for further evaluation of possible medication or EP options.  Follow-up with Dr. [name] 2 weeks after  cardioversion.  Continue Rythmol 225 3 times daily.  Continue Coumadin 1 mg 3 tabs daily.  Aspirin 81 mg daily.  2. S/P aortic valve replacement Status post Wheat procedure at [facility] by Dr. Ysidro Evert 07/28/2014 involving mechanical AVR with root replacement.  Also had Maze procedure and left atrial appendage exclusion.  Postop course was complicated by PAF.  3. Coarctation of aorta (preductal) (postductal) History of coarctation of the aorta was referred to [facility] for surgical intervention.  Surgeon did not feel coarctation was severe enough to surgically intervene.  4. Essential hypertension Patient is normotensive today.  Blood pressure 118/66.  Continue Lasix 40 mg daily.   Medication Adjustments/Labs and Tests Ordered: Current medicines are reviewed at length with the patient today.  Concerns regarding medicines are outlined above.   Disposition: Follow-up with Dr Lovena Le after TEE cardioversion then two weeks after with Dr [name].  Signed, Levell July, NP 07/05/2019 3:41 PM    [facility]

## 2019-07-05 NOTE — Patient Instructions (Addendum)
Medication Instructions:  Your physician has recommended you make the following change in your medication:  1.  INCREASE the Metoprolol to twice a day    *If you need a refill on your cardiac medications before your next appointment, please call your pharmacy*   Lab Work: TODAY:  BMET, CBC, & PT/INR    If you have labs (blood work) drawn today and your tests are completely normal, you will receive your results only by: Marland Kitchen MyChart Message (if you have MyChart) OR . A paper copy in the mail If you have any lab test that is abnormal or we need to change your treatment, we will call you to review the results.   Testing/Procedures: Your physician has requested that you have a TEE/Cardioversion. During a TEE, sound waves are used to create images of your heart. It provides your doctor with information about the size and shape of your heart and how well your heart's chambers and valves are working. In this test, a transducer is attached to the end of a flexible tube that is guided down you throat and into your esophagus (the tube leading from your mouth to your stomach) to get a more detailed image of your heart. Once the TEE has determined that a blood clot is not present, the cardioversion begins. Electrical Cardioversion uses a jolt of electricity to your heart either through paddles or wired patches attached to your chest. This is a controlled, usually prescheduled, procedure. This procedure is done at the hospital and you are not awake during the procedure. You usually go home the day of the procedure. Please see the instruction sheet given to you today for more information.    Dear Mallory Sutton You are scheduled for a TEE/Cardioversion/TEE Cardioversion on Wednesday 07/12/2019 with Dr. Radford Pax.  Please arrive at the [facility] (Main Entrance A) at [facility]: [place], [place] [address] at 8:00 a.m   DIET: Nothing to eat or drink after midnight except a sip of water with  medications (see medication instructions below)  Medication Instructions: Hold LASIX the morning of your procedure  Continue your anticoagulant: Warfarin  (DO NOT MISS A DOSE) You will need to continue your anticoagulant after your procedure until you  are told by your  Provider that it is safe to stop   Labs: If patient is on Coumadin, patient needs pt/INR, CBC, BMET within 3 days (No pt/INR needed for patients taking Xarelto, Eliquis, Pradaxa) For patients receiving anesthesia for TEE and all Cardioversion patients: BMET, CBC within 1 week  Come to: Monday 07/10/2019:  [facility] to [facility] for the [place] test at 10:00.  Once you get swabbed, you will be required to go home and quarantine until your procedure on Wednesday.  You must have a responsible person to drive you home and stay in the waiting area during your procedure. Failure to do so could result in cancellation.  Bring your insurance cards.  *Special Note: Every effort is made to have your procedure done on time. Occasionally there are emergencies that occur at the hospital that may cause delays. Please be patient if a delay does occur.     Follow-Up: At [facility], you and your health needs are our priority.  As part of our continuing mission to provide you with exceptional heart care, we have created designated Provider Care Teams.  These Care Teams include your primary Cardiologist (physician) and Advanced Practice Providers (APPs -  Physician  Assistants and Nurse Practitioners) who all work together to provide you with the care you need, when you need it.      Your next appointment:   2 weeks from your procedure with Dr. Lovena Le Someone will call you with that appointment   The format for your next appointment:   In Person  Provider:   You may see Jenkins Rouge, MD or one of the following Advanced Practice Providers on your designated Care Team:    Truitt Merle, NP  Cecilie Kicks, NP  Kathyrn Drown, NP    Other Instructions  Want you to follow up with Dr. [name] in 6 weeks.  His nurse will call you with an appointment.

## 2019-07-05 NOTE — H&P (View-Only) (Signed)
Cardiology Office Note  Date: 07/05/2019   ID: [name], [name] 1961-02-07, MRN [id number]  PCP:  Celene Squibb, MD  Cardiologist:  Jenkins Rouge, MD Electrophysiologist:  None   Chief Complaint  Patient presents with  . Follow-up    PAF, hypertension, AVR, coarctation of aorta, thoracic aortic aneurysm without rupture,    History of Present Illness: Mallory Sutton is a 59 y.o. female with a history of PAF, HTN, coarctation of aorta, bicuspid AV with ascending aortic aneurysm.  Status post LEEP procedure at [facility] by Dr. Ysidro Evert on July 25, 2014 department mechanical AVR with root replacement.  Also maze procedure and left atrial appendage exclusion.  Postop course complicated by PAF and pleural effusion requiring thoracentesis.  Patient presents today with complaints of sudden increase in heart rate starting Friday.  States she was at home petting her cat and noticed her heart start racing.  States it has been constant since that time ranging from low 100s to 160 and rate.  Today heart rate 130s to 140s.  She states this is new new to her.  States she can tell the difference between her atrial fibrillation episodes and this is not characteristic of the atrial fibrillation she states she has been dealing with atrial fibrillation for years.  He denies any chest pain, pressure, tightness, radiation to neck arm back or jaw.  Denies any orthostatic symptoms, states she just feels tired from being in this rhythm for the last 5 days.  He states she has taken an extra dose of Rythmol each day totaling 4 doses of 225 mg which is not decreased or converted the rhythm.  She is taking her Toprol-XL twice a day without any change in rate or rhythm.   Past Medical History:  Diagnosis Date  . Aortic stenosis   . ATRIAL ARRHYTHMIAS   . Bicuspid aortic valve   . CHF (congestive heart failure) ([place])   . COARCTATION OF AORTA   . ENDOMETRIOSIS   . Heart murmur   . Paroxysmal atrial fibrillation (HCC)   .  Shortness of breath   . THORACIC AORTIC ANEURYSM     Past Surgical History:  Procedure Laterality Date  . coarctation repair and residual restenosis    . KNEE SURGERY      Current Outpatient Medications  Medication Sig Dispense Refill  . aspirin EC 81 MG tablet Take 81 mg by mouth daily.    . furosemide (LASIX) 40 MG tablet Take 40 mg by mouth daily.    Marland Kitchen gabapentin (NEURONTIN) 300 MG capsule TAKE ONE CAP IN THE MORNING, ONE IN THE AFTERNOON AND TWO AT NIGHT    . metoprolol succinate (TOPROL-XL) 25 MG 24 hr tablet TAKE (1) TABLET BY MOUTH TWICE DAILY. 60 tablet 6  . Multiple Vitamin (MULTI-VITAMINS) TABS Take by mouth daily.    . propafenone (RYTHMOL) 225 MG tablet TAKE (1) TABLET BY MOUTH (3) TIMES DAILY. 90 tablet 6  . traMADol (ULTRAM) 50 MG tablet Take 1 tablet (50 mg total) by mouth 3 (three) times daily. 90 tablet 3  . triamcinolone cream (KENALOG) 0.1 % Apply 1 application topically 2 (two) times daily.    Marland Kitchen trolamine salicylate (ASPERCREME) 10 % cream Apply 1 application topically as needed for muscle pain (Back pain).    Marland Kitchen warfarin (COUMADIN) 1 MG tablet TAKE 3 TABLETS DAILY, OR AS DIRECTED. 100 tablet 2   No current facility-administered medications for this visit.   Allergies:  Amiodarone and Penicillins  Social History: The patient  reports that she has never smoked. She has never used smokeless tobacco. She reports current alcohol use. She reports that she does not use drugs.   Family History: The patient's Family history is unknown by patient.   ROS:  Please see the history of present illness. Otherwise, complete review of systems is positive for none.  All other systems are reviewed and negative.   Physical Exam: VS:  BP 118/66   Pulse (!) 121   Ht 5' 4"  (1.626 m)   Wt 107 lb (48.5 kg)   LMP 07/16/2013   SpO2 97%   BMI 18.37 kg/m , BMI Body mass index is 18.37 kg/m.  Wt Readings from Last 3 Encounters:  07/05/19 107 lb (48.5 kg)  03/06/19 109 lb (49.4 kg)   03/25/18 107 lb (48.5 kg)    General: Patient appears comfortable at rest. Neck: Supple, no elevated JVP or carotid bruits, no thyromegaly. Lungs: Clear to auscultation, nonlabored breathing at rest. Cardiac: Tachycardic and atrial flutter, no S3 or significant systolic murmur, no pericardial rub. Extremities: No pitting edema, distal pulses 2+. Skin: Warm and dry.  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 Musculoskeletal: No kyphosis. Neuropsychiatric: Alert and oriented x3, affect grossly appropriate.  ECG:  An ECG dated 07/05/2019 was personally reviewed today and demonstrated:  Atrial flutter with a rate of 136  Recent Labwork: No results found for requested labs within last 8760 hours.     Component Value Date/Time   CHOL 144 01/23/2015 0913   TRIG 87 01/23/2015 0913   HDL 44 01/23/2015 0913   CHOLHDL 3.3 01/23/2015 0913   VLDL 17 01/23/2015 0913   LDLCALC 83 01/23/2015 0913    Other Studies Reviewed Today:  Echocardiogram 08/22/2014 Study Conclusions   - Left ventricle: The cavity size was normal. Wall thickness was  increased in a pattern of mild LVH. Systolic function was normal.  The estimated ejection fraction was in the range of 55% to 60%.  Wall motion was normal; there were no regional wall motion  abnormalities.  - Aortic valve: A bioprosthesis was present.  - Right ventricle: The cavity size was moderately decreased.  - Pulmonary arteries: PA peak pressure: 32 mm Hg (S).  - Pericardium, extracardiac: A small pericardial effusion was  identified. There was no evidence of hemodynamic compromise.  There was a large right pleural effusion. There was a left  pleural effusion.   Assessment and Plan:  1. Paroxysmal atrial fibrillation (HCC)   2. S/P aortic valve replacement   3. Coarctation of aorta (preductal) (postductal)   4. Essential hypertension   5. Thoracic aortic aneurysm without  rupture ([place])    1. Atrial flutter, unspecified type [facility]) Patient presents with a 5-day history of sudden onset of tachycardia occurring while she was at home Friday evening.  Patient states that the heart rate has ranged from the low 100s to 160s.  Today EKG shows atrial flutter with a rate of 136.  We discussed the case with Dr. [name] who advised the patient needs a transesophageal cardioversion as soon as possible.  Get be met, CBC and INR.  Last INR was subtherapeutic at 1.8.  Increase Toprol to 50 mg p.o. twice daily.  Schedule a TEE cardioversion as soon as possible next week.  After cardioversion follow-up with EP Dr. Lovena Le for further evaluation of possible medication or EP options.  Follow-up with Dr. [name] 2 weeks after  cardioversion.  Continue Rythmol 225 3 times daily.  Continue Coumadin 1 mg 3 tabs daily.  Aspirin 81 mg daily.  2. S/P aortic valve replacement Status post Wheat procedure at [facility] by Dr. Ysidro Evert 07/28/2014 involving mechanical AVR with root replacement.  Also had Maze procedure and left atrial appendage exclusion.  Postop course was complicated by PAF.  3. Coarctation of aorta (preductal) (postductal) History of coarctation of the aorta was referred to [facility] for surgical intervention.  Surgeon did not feel coarctation was severe enough to surgically intervene.  4. Essential hypertension Patient is normotensive today.  Blood pressure 118/66.  Continue Lasix 40 mg daily.   Medication Adjustments/Labs and Tests Ordered: Current medicines are reviewed at length with the patient today.  Concerns regarding medicines are outlined above.   Disposition: Follow-up with Dr Lovena Le after TEE cardioversion then two weeks after with Dr [name].  Signed, Levell July, NP 07/05/2019 3:41 PM    [facility]

## 2019-07-06 LAB — BASIC METABOLIC PANEL
BUN/Creatinine Ratio: 17 (ref 9–23)
BUN: 13 mg/dL (ref 6–24)
CO2: 24 mmol/L (ref 20–29)
Calcium: 9.1 mg/dL (ref 8.7–10.2)
Chloride: 101 mmol/L (ref 96–106)
Creatinine, Ser: 0.78 mg/dL (ref 0.57–1.00)
GFR calc Af Amer: 98 mL/min/{1.73_m2} (ref >59)
GFR calc non Af Amer: 85 mL/min/{1.73_m2} (ref >59)
Glucose: 99 mg/dL (ref 65–99)
Potassium: 3.8 mmol/L (ref 3.5–5.2)
Sodium: 141 mmol/L (ref 134–144)

## 2019-07-06 LAB — CBC
Hematocrit: 44.2 % (ref 34.0–46.6)
Hemoglobin: 14.4 g/dL (ref 11.1–15.9)
MCH: 28.2 pg (ref 26.6–33.0)
MCHC: 32.6 g/dL (ref 31.5–35.7)
MCV: 87 fL (ref 79–97)
Platelets: 479 10*3/uL — ABNORMAL HIGH (ref 150–450)
RBC: 5.1 x10E6/uL (ref 3.77–5.28)
RDW: 13.6 % (ref 11.7–15.4)
WBC: 10.1 10*3/uL (ref 3.4–10.8)

## 2019-07-06 LAB — PROTIME-INR
INR: 1.7 — ABNORMAL HIGH (ref 0.9–1.2)
Prothrombin Time: 17.8 s — ABNORMAL HIGH (ref 9.1–12.0)

## 2019-07-06 NOTE — Addendum Note (Signed)
Addended by: Luiz Blare on: 07/06/2019 11:16 AM   Modules accepted: Orders, SmartSet

## 2019-07-06 NOTE — Telephone Encounter (Addendum)
Pt called and she is needing a 6 week ROV with Dr. [name] in [place] after her scheduled TEE/ Cardioversion planned for 07/12/19.   I am unable to find a " 6 week" appt date/time for around 08/18/19. Will forward to PCC/ Nurse in [place] to schedule with the pt. Pt aware she will get another call back and verbalized understanding.   Pt also reports that her BMET, CBC, INR was 07/05/19 and according to her instructions she is needing to have labs 3 days prior to her procedure. I advised her that she may be able to obtain the morning of the procedure. Will talk with the Coumadin Clinic re: her recent INR also.  I will forward her message and we will check into it and call her back this afternoon.

## 2019-07-06 NOTE — Telephone Encounter (Signed)
Follow up  ° ° °Patient is returning call.  °

## 2019-07-06 NOTE — Addendum Note (Signed)
Addended by: Gaetano Net on: 07/06/2019 11:05 AM   Modules accepted: Orders

## 2019-07-07 ENCOUNTER — Ambulatory Visit (INDEPENDENT_AMBULATORY_CARE_PROVIDER_SITE_OTHER): Payer: Self-pay | Admitting: Pharmacist

## 2019-07-07 ENCOUNTER — Telehealth: Payer: Self-pay | Admitting: *Deleted

## 2019-07-07 DIAGNOSIS — Q251 Coarctation of aorta: Secondary | ICD-10-CM

## 2019-07-07 DIAGNOSIS — Z952 Presence of prosthetic heart valve: Secondary | ICD-10-CM

## 2019-07-07 DIAGNOSIS — I4891 Unspecified atrial fibrillation: Secondary | ICD-10-CM

## 2019-07-07 DIAGNOSIS — Q231 Congenital insufficiency of aortic valve: Secondary | ICD-10-CM

## 2019-07-07 DIAGNOSIS — Z5181 Encounter for therapeutic drug level monitoring: Secondary | ICD-10-CM

## 2019-07-07 NOTE — Telephone Encounter (Signed)
-----   Message from Verta Ellen., NP sent at 07/06/2019 11:21 AM EST ----- Please see if we can get patient to coumadin clinic today. She is schedule to have TEE cardioversion 07/12/2019. Her INR is subtherapeutic and she needs adjustment before her procedure. Thank You

## 2019-07-07 NOTE — Telephone Encounter (Signed)
Left message for patient to call back. Patient will need to take extra coumadin tonight and then she will need a dose increase as her INR was low 2 weeks ago as well.

## 2019-07-07 NOTE — Telephone Encounter (Signed)
Spoke with patient and advised her to take 4.5mg  tonight and then start taking 3mg  daily except 4.5mg  on Sunday. Patient would like clarification if she needs INR again prior to cardioversion.  She also needs appointment for INR check post cardioversion. Lattie Haw did not have anything available that patient could make. Lattie Haw can you please call patient and see if there is anything you can do?

## 2019-07-07 NOTE — Telephone Encounter (Signed)
INR the day of procedure is fine

## 2019-07-10 ENCOUNTER — Other Ambulatory Visit (HOSPITAL_COMMUNITY)
Admission: RE | Admit: 2019-07-10 | Discharge: 2019-07-10 | Disposition: A | Discharge status: Home / Self Care | Payer: HRSA Program | Source: Ambulatory Visit | Attending: Cardiology | Admitting: Cardiology

## 2019-07-10 DIAGNOSIS — Z01812 Encounter for preprocedural laboratory examination: Secondary | ICD-10-CM | POA: Insufficient documentation

## 2019-07-10 DIAGNOSIS — Z20822 Contact with and (suspected) exposure to covid-19: Secondary | ICD-10-CM | POA: Insufficient documentation

## 2019-07-10 NOTE — Telephone Encounter (Signed)
Spoke with patient.  1st post DCCV INR scheduled for 3/17/@ 4:15pm.

## 2019-07-11 LAB — SARS CORONAVIRUS 2 (TAT 6-24 HRS): SARS Coronavirus 2: NEGATIVE

## 2019-07-11 NOTE — Anesthesia Preprocedure Evaluation (Addendum)
Anesthesia Evaluation  Patient identified by MRN, date of birth, ID band Patient awake    Reviewed: Allergy & Precautions, H&P , NPO status , Patient's Chart, lab work & pertinent test results  Airway Mallampati: II  TM Distance: >3 FB Neck ROM: Full    Dental no notable dental hx. (+) Teeth Intact, Dental Advisory Given   Pulmonary neg pulmonary ROS,    Pulmonary exam normal breath sounds clear to auscultation       Cardiovascular Exercise Tolerance: Good +CHF  + dysrhythmias Atrial Fibrillation  Rhythm:Regular Rate:Normal  Coarctation of the aorta    Neuro/Psych negative neurological ROS  negative psych ROS   GI/Hepatic negative GI ROS, Neg liver ROS,   Endo/Other  negative endocrine ROS  Renal/GU negative Renal ROS  negative genitourinary   Musculoskeletal   Abdominal   Peds  Hematology negative hematology ROS (+)   Anesthesia Other Findings   Reproductive/Obstetrics negative OB ROS                            Anesthesia Physical Anesthesia Plan  ASA: III  Anesthesia Plan: MAC   Post-op Pain Management:    Induction: Intravenous  PONV Risk Score and Plan: 2 and Propofol infusion and Treatment may vary due to age or medical condition  Airway Management Planned: Nasal Cannula  Additional Equipment:   Intra-op Plan:   Post-operative Plan:   Informed Consent: I have reviewed the patients History and Physical, chart, labs and discussed the procedure including the risks, benefits and alternatives for the proposed anesthesia with the patient or authorized representative who has indicated his/her understanding and acceptance.     Dental advisory given  Plan Discussed with: CRNA  Anesthesia Plan Comments:        Anesthesia Quick Evaluation

## 2019-07-12 ENCOUNTER — Ambulatory Visit (HOSPITAL_COMMUNITY)
Admission: RE | Admit: 2019-07-12 | Discharge: 2019-07-12 | Disposition: A | Discharge status: Home / Self Care | Payer: Self-pay | Attending: Cardiology | Admitting: Cardiology

## 2019-07-12 ENCOUNTER — Other Ambulatory Visit: Payer: Self-pay

## 2019-07-12 ENCOUNTER — Ambulatory Visit (HOSPITAL_COMMUNITY): Payer: Self-pay | Admitting: Certified Registered"

## 2019-07-12 ENCOUNTER — Encounter (HOSPITAL_COMMUNITY): Payer: Self-pay | Admitting: Cardiology

## 2019-07-12 ENCOUNTER — Encounter (HOSPITAL_COMMUNITY)
Admission: RE | Disposition: A | Discharge status: Home / Self Care | Payer: Self-pay | Source: Home / Self Care | Attending: Cardiology

## 2019-07-12 ENCOUNTER — Ambulatory Visit (HOSPITAL_BASED_OUTPATIENT_CLINIC_OR_DEPARTMENT_OTHER)
Admission: RE | Admit: 2019-07-12 | Discharge: 2019-07-12 | Disposition: A | Discharge status: Home / Self Care | Payer: Self-pay | Source: Ambulatory Visit | Attending: Family Medicine | Admitting: Family Medicine

## 2019-07-12 DIAGNOSIS — Z7901 Long term (current) use of anticoagulants: Secondary | ICD-10-CM | POA: Insufficient documentation

## 2019-07-12 DIAGNOSIS — I480 Paroxysmal atrial fibrillation: Secondary | ICD-10-CM | POA: Insufficient documentation

## 2019-07-12 DIAGNOSIS — Z7982 Long term (current) use of aspirin: Secondary | ICD-10-CM | POA: Insufficient documentation

## 2019-07-12 DIAGNOSIS — I712 Thoracic aortic aneurysm, without rupture: Secondary | ICD-10-CM | POA: Insufficient documentation

## 2019-07-12 DIAGNOSIS — I340 Nonrheumatic mitral (valve) insufficiency: Secondary | ICD-10-CM

## 2019-07-12 DIAGNOSIS — I4892 Unspecified atrial flutter: Secondary | ICD-10-CM | POA: Insufficient documentation

## 2019-07-12 DIAGNOSIS — I350 Nonrheumatic aortic (valve) stenosis: Secondary | ICD-10-CM | POA: Insufficient documentation

## 2019-07-12 DIAGNOSIS — Z952 Presence of prosthetic heart valve: Secondary | ICD-10-CM | POA: Insufficient documentation

## 2019-07-12 DIAGNOSIS — Z79899 Other long term (current) drug therapy: Secondary | ICD-10-CM | POA: Insufficient documentation

## 2019-07-12 DIAGNOSIS — I110 Hypertensive heart disease with heart failure: Secondary | ICD-10-CM | POA: Insufficient documentation

## 2019-07-12 DIAGNOSIS — Z880 Allergy status to penicillin: Secondary | ICD-10-CM | POA: Insufficient documentation

## 2019-07-12 DIAGNOSIS — Q251 Coarctation of aorta: Secondary | ICD-10-CM | POA: Insufficient documentation

## 2019-07-12 DIAGNOSIS — I509 Heart failure, unspecified: Secondary | ICD-10-CM | POA: Insufficient documentation

## 2019-07-12 HISTORY — PX: TEE WITHOUT CARDIOVERSION: SHX5443

## 2019-07-12 LAB — PROTIME-INR
INR: 3 — ABNORMAL HIGH (ref 0.8–1.2)
Prothrombin Time: 30.8 seconds — ABNORMAL HIGH (ref 11.4–15.2)

## 2019-07-12 SURGERY — ECHOCARDIOGRAM, TRANSESOPHAGEAL
Anesthesia: General

## 2019-07-12 MED ORDER — PROPOFOL 500 MG/50ML IV EMUL
INTRAVENOUS | Status: DC | PRN
  Administered 2019-07-12: 100 ug/kg/min via INTRAVENOUS
  Administered 2019-07-12: 125 ug/kg/min via INTRAVENOUS

## 2019-07-12 MED ORDER — SODIUM CHLORIDE 0.9 % IV SOLN: INTRAVENOUS | Status: DC

## 2019-07-12 MED ORDER — PROPOFOL 10 MG/ML IV BOLUS
INTRAVENOUS | Status: DC | PRN
  Administered 2019-07-12 (×3): 20 mg via INTRAVENOUS

## 2019-07-12 NOTE — Interval H&P Note (Signed)
History and Physical Interval Note:  07/12/2019 9:13 AM  Mallory Sutton  has presented today for surgery, with the diagnosis of atrial flutter.  The various methods of treatment have been discussed with the patient and family. After consideration of risks, benefits and other options for treatment, the patient has consented to  Procedure(s): TRANSESOPHAGEAL ECHOCARDIOGRAM (TEE) (N/A) CARDIOVERSION (N/A) as a surgical intervention.  The patient's history has been reviewed, patient examined, no change in status, stable for surgery.  I have reviewed the patient's chart and labs.  Questions were answered to the patient's satisfaction.     Fransico Him

## 2019-07-12 NOTE — Transfer of Care (Signed)
Immediate Anesthesia Transfer of Care Note  Patient: Mallory Sutton  Procedure(s) Performed: TRANSESOPHAGEAL ECHOCARDIOGRAM (TEE) (N/A )  Patient Location: Endoscopy Unit  Anesthesia Type:General  Level of Consciousness: drowsy and patient cooperative  Airway & Oxygen Therapy: Patient Spontanous Breathing and Patient connected to nasal cannula oxygen  Post-op Assessment: Report given to RN and Post -op Vital signs reviewed and stable  Post vital signs: Reviewed and stable  Last Vitals:  Vitals Value Taken Time  BP 119/62 07/12/19 0948  Temp 36.6 C 07/12/19 0948  Pulse 93 07/12/19 0948  Resp 20 07/12/19 0948  SpO2 100 % 07/12/19 0948  Vitals shown include unvalidated device data.  Last Pain:  Vitals:   07/12/19 0948  TempSrc: Oral  PainSc: 0-No pain         Complications: No apparent anesthesia complications

## 2019-07-12 NOTE — Anesthesia Postprocedure Evaluation (Signed)
Anesthesia Post Note  Patient: Mallory Sutton  Procedure(s) Performed: TRANSESOPHAGEAL ECHOCARDIOGRAM (TEE) (N/A )     Patient location during evaluation: Endoscopy Anesthesia Type: MAC Level of consciousness: awake and alert Pain management: pain level controlled Vital Signs Assessment: post-procedure vital signs reviewed and stable Respiratory status: spontaneous breathing, nonlabored ventilation and respiratory function stable Cardiovascular status: stable and blood pressure returned to baseline Postop Assessment: no apparent nausea or vomiting Anesthetic complications: no    Last Vitals:  Vitals:   07/12/19 1010 07/12/19 1020  BP: 136/79 130/70  Pulse: 93 93  Resp: 14 (!) 22  Temp:    SpO2: 99% 99%    Last Pain:  Vitals:   07/12/19 1020  TempSrc:   PainSc: 0-No pain                 Jenissa Tyrell,W. EDMOND

## 2019-07-12 NOTE — CV Procedure (Signed)
    PROCEDURE NOTE:  Procedure:  Transesophageal echocardiogram Operator:  Fransico Him, MD Indications:  Atrial fibrillation Complications: None  During this procedure the patient is administered a total of Propofol 120 mg to achieve and maintain moderate conscious sedation.  The patient's heart rate, blood pressure, and oxygen saturation are monitored continuously during the procedure by anesthesia.   Results: Normal LV size and function Normal RV size and function Normal RA Normal LA.  The LAA has heavy spontaneous echo contrast with a probable thrombus in the apex of the appendage with fibrin strands emanating into the body of the appendage. Normal TV Normal PV with trivial PR Normal MV with mild MR Normal appearing mechanical AVR with no obvious AI Normal interatrial septum with no evidence of shunt by colorflow dopper  Normal ascending aorta.  Unable to visualize coarctation.    The patient tolerated the procedure well and was transferred back to their room in stable condition.  Signed: Fransico Him, MD [facility]

## 2019-07-12 NOTE — Discharge Instructions (Signed)

## 2019-07-13 ENCOUNTER — Encounter: Payer: Self-pay | Admitting: *Deleted

## 2019-07-14 NOTE — Telephone Encounter (Signed)
Per Dr. [name], "Would have her get weekly INR x 3 and f/u with me or afib clinici in 3-4 weeks to see if we can arrange [facility] at that time with no TEE." Patient has INR check on 3/17 in [place]. Will send message to Edrick Oh in coumadin clinic to see if other INR's can be scheduled. Will send message to [place] to see if patient's appointments there are okay to keep as scheduled.

## 2019-07-19 ENCOUNTER — Telehealth: Payer: Self-pay | Admitting: Internal Medicine

## 2019-07-19 NOTE — Telephone Encounter (Signed)
Pt had INR appt for today but cancelled it because she didn't think she needed since DCCV was cancelled due to thrombus.  Explained to pt that Dr [name] wants her INR checked weekly with f/u with him in 3-4 wks to hopefully reschedule DCCV at that time.  INR appt made for tomorrow.  Pt verbalized understanding.

## 2019-07-19 NOTE — Telephone Encounter (Signed)
Asking about how she needs to do her medication since getting blood clot

## 2019-07-20 ENCOUNTER — Other Ambulatory Visit: Payer: Self-pay

## 2019-07-20 ENCOUNTER — Ambulatory Visit (INDEPENDENT_AMBULATORY_CARE_PROVIDER_SITE_OTHER): Payer: Self-pay | Admitting: *Deleted

## 2019-07-20 DIAGNOSIS — I480 Paroxysmal atrial fibrillation: Secondary | ICD-10-CM

## 2019-07-20 DIAGNOSIS — Z5181 Encounter for therapeutic drug level monitoring: Secondary | ICD-10-CM

## 2019-07-20 LAB — POCT INR: INR: 3.3 — AB (ref 2.0–3.0)

## 2019-07-20 NOTE — Patient Instructions (Signed)
Take 2 tablets tonight then resume 3 tablets daily excpet 4 on Sunday.  Recheck INR in 1 wk Continue greens

## 2019-07-26 ENCOUNTER — Ambulatory Visit: Payer: Self-pay | Admitting: Internal Medicine

## 2019-07-27 ENCOUNTER — Ambulatory Visit (INDEPENDENT_AMBULATORY_CARE_PROVIDER_SITE_OTHER): Payer: Self-pay | Admitting: *Deleted

## 2019-07-27 ENCOUNTER — Other Ambulatory Visit: Payer: Self-pay

## 2019-07-27 ENCOUNTER — Ambulatory Visit: Payer: Self-pay | Admitting: Internal Medicine

## 2019-07-27 DIAGNOSIS — Z5181 Encounter for therapeutic drug level monitoring: Secondary | ICD-10-CM

## 2019-07-27 DIAGNOSIS — I480 Paroxysmal atrial fibrillation: Secondary | ICD-10-CM

## 2019-07-27 LAB — POCT INR: INR: 5.1 — AB (ref 2.0–3.0)

## 2019-07-27 NOTE — Patient Instructions (Signed)
Hold warfarin tonight and tomorrow night then decrease dose to 3 tablets daily Recheck INR in 1 wk Continue greens

## 2019-08-02 ENCOUNTER — Ambulatory Visit (INDEPENDENT_AMBULATORY_CARE_PROVIDER_SITE_OTHER): Payer: Self-pay | Admitting: *Deleted

## 2019-08-02 ENCOUNTER — Other Ambulatory Visit: Payer: Self-pay

## 2019-08-02 DIAGNOSIS — Z5181 Encounter for therapeutic drug level monitoring: Secondary | ICD-10-CM

## 2019-08-02 DIAGNOSIS — I480 Paroxysmal atrial fibrillation: Secondary | ICD-10-CM

## 2019-08-02 LAB — POCT INR: INR: 2.5 (ref 2.0–3.0)

## 2019-08-02 NOTE — Patient Instructions (Signed)
Continue warfarin 3 tablets daily Recheck INR in 1 wk Continue greens

## 2019-08-09 ENCOUNTER — Other Ambulatory Visit: Payer: Self-pay

## 2019-08-09 ENCOUNTER — Ambulatory Visit (INDEPENDENT_AMBULATORY_CARE_PROVIDER_SITE_OTHER): Payer: Self-pay | Admitting: *Deleted

## 2019-08-09 DIAGNOSIS — I480 Paroxysmal atrial fibrillation: Secondary | ICD-10-CM

## 2019-08-09 DIAGNOSIS — Z5181 Encounter for therapeutic drug level monitoring: Secondary | ICD-10-CM

## 2019-08-09 LAB — POCT INR: INR: 3.3 — AB (ref 2.0–3.0)

## 2019-08-09 NOTE — Patient Instructions (Signed)
4th pre DCCV if Dr [name] decides to do so Take warfarin 1 tablet tonight then resume 3 tablets daily Recheck INR in 1 wk Continue greens Message sent to Dr [name] to see follow up plan

## 2019-08-10 ENCOUNTER — Telehealth: Payer: Self-pay

## 2019-08-10 MED ORDER — DIAZEPAM 5 MG PO TABS: 5.0000 mg | ORAL_TABLET | Freq: Every day | ORAL | 0 refills | Status: DC | PRN

## 2019-08-10 NOTE — Telephone Encounter (Signed)
Tried to call patient this morning. Left message for patient to call back. Will also send mychart message.

## 2019-08-10 NOTE — Telephone Encounter (Signed)
Called in Valium 5 mg by mouth daily as needed for anxiety, 30 tablets with no refills to [facility] in [place].

## 2019-08-10 NOTE — Telephone Encounter (Signed)
-----   Message from Malen Gauze, RN sent at 08/09/2019  4:56 PM EDT ----- Boykin Nearing, Can you take care of this and get her scheduled? Thanks, Lattie Haw ----- Message ----- From: Josue Hector, MD Sent: 08/09/2019   4:45 PM EDT To: Malen Gauze, RN  She should be getting [facility] set up for early next week ----- Message ----- From: Malen Gauze, RN Sent: 08/09/2019   4:23 PM EDT To: Josue Hector, MD  Hi Dr [name].  I saw Lilianah for her INR check today.  Have been seeing her weekly for 4 weeks and today was her 4th therapeutic INR since her TEE where a clot was found.  Please let me know what the plan is and how to proceed from here.  These weekly INR's are a strain on her emotionally and financially.   Thanks, Lattie Haw

## 2019-08-10 NOTE — Telephone Encounter (Signed)
Left message for patient to call back  

## 2019-08-10 NOTE — Telephone Encounter (Signed)
Patient called back. Patient stated she is not mentaly or physically ready to do a cardioversion on Tuesday. Patient stated she was not prepared to do this next week and she needs time to prepare. Informed patient that we could call valium in for nerves and keep her appointment in [place] with Bernerd Pho PA and they can set up her cardioversion at that appointment. Informed patient that would give her time to get her "act together" (as patient stated). Patient will discuss with her husband and will have a date in mind when she goes to her appointment next week. Will send a message to Dr. [name] to clarify the number of valium 5 mg to give patient and number of refills if any.

## 2019-08-14 ENCOUNTER — Telehealth: Payer: Self-pay | Admitting: Cardiovascular Disease

## 2019-08-14 NOTE — Telephone Encounter (Signed)
Patient called back. Patient stated she is not mentally, financially  or physically ready to do a cardioversion on Wednesday. Patient stated she was not prepared to do this next week and she needs time to prepare. Informed patient that we could call valium in for nerves and keep her appointment in [place] with Bernerd Pho PA and they can set up her cardioversion at that appointment. Informed patient that would give her time to get her "act together" (as patient stated). Patient will discuss with her husband and will have a date in mind when she goes to her appointment next week.   Patient is also wanting to get her INR checked when she gets her blood work for cardioversion, so that way she does not have to make an extra trip to [place] this week.  Will cancel cardioversion for Wednesday, patient stated she cannot do this week.

## 2019-08-14 NOTE — Telephone Encounter (Signed)
Patient states that she was to have a procedure rescheduled but she got a call today about the procedure as if it was still scheduled for its original date. Please advise.

## 2019-08-14 NOTE — Telephone Encounter (Signed)
She can.  Told her it would have to be a lab draw and she was fine with that.  I will have Tanzania order it when she sees her. Thanks

## 2019-08-14 NOTE — Telephone Encounter (Signed)
Talked to patient. Patient wants to have office visit with PA first before scheduling cardioversion. Patient stated she is not mentally, financially or physically ready to have procedure done this week. Patient will have dates ready for scheduling when she meets with Bernerd Pho PA on Friday. Patient would like to also have all her blood work done on the same day, instead of going to [place] for coumadin check. Will see if Lattie Haw can help with this.

## 2019-08-15 ENCOUNTER — Other Ambulatory Visit: Payer: Self-pay | Admitting: Cardiovascular Disease

## 2019-08-15 ENCOUNTER — Telehealth: Payer: Self-pay | Admitting: *Deleted

## 2019-08-15 NOTE — Telephone Encounter (Signed)
Spoke with patient.  She will have INR drawn at the lab when she sees Tanzania in the [place] office on 4/16.  Will call pt with instructions when results are back.

## 2019-08-15 NOTE — Telephone Encounter (Signed)
Patient called requesting to speak with Edrick Oh, RN in regards to coumdin appointments. Please call home #

## 2019-08-16 ENCOUNTER — Encounter (HOSPITAL_COMMUNITY): Payer: Self-pay

## 2019-08-16 ENCOUNTER — Ambulatory Visit (HOSPITAL_COMMUNITY): Admit: 2019-08-16 | Payer: Self-pay | Admitting: Cardiology

## 2019-08-16 SURGERY — CARDIOVERSION
Anesthesia: General

## 2019-08-18 ENCOUNTER — Encounter: Payer: Self-pay | Admitting: *Deleted

## 2019-08-18 ENCOUNTER — Ambulatory Visit (INDEPENDENT_AMBULATORY_CARE_PROVIDER_SITE_OTHER): Payer: Self-pay | Admitting: Student

## 2019-08-18 ENCOUNTER — Encounter: Payer: Self-pay | Admitting: Student

## 2019-08-18 ENCOUNTER — Other Ambulatory Visit (HOSPITAL_COMMUNITY)
Admission: AD | Admit: 2019-08-18 | Discharge: 2019-08-18 | Disposition: A | Discharge status: Home / Self Care | Payer: Self-pay | Source: Other Acute Inpatient Hospital | Attending: Student | Admitting: Student

## 2019-08-18 VITALS — BP 130/70 | HR 95 | Temp 97.6°F | Ht 64.0 in | Wt 112.0 lb

## 2019-08-18 DIAGNOSIS — Z79899 Other long term (current) drug therapy: Secondary | ICD-10-CM | POA: Insufficient documentation

## 2019-08-18 DIAGNOSIS — Z952 Presence of prosthetic heart valve: Secondary | ICD-10-CM

## 2019-08-18 DIAGNOSIS — I10 Essential (primary) hypertension: Secondary | ICD-10-CM

## 2019-08-18 DIAGNOSIS — I4892 Unspecified atrial flutter: Secondary | ICD-10-CM

## 2019-08-18 DIAGNOSIS — Q251 Coarctation of aorta: Secondary | ICD-10-CM

## 2019-08-18 DIAGNOSIS — I4891 Unspecified atrial fibrillation: Secondary | ICD-10-CM | POA: Insufficient documentation

## 2019-08-18 LAB — BASIC METABOLIC PANEL
Anion gap: 10 (ref 5–15)
BUN: 13 mg/dL (ref 6–20)
CO2: 29 mmol/L (ref 22–32)
Calcium: 8.8 mg/dL — ABNORMAL LOW (ref 8.9–10.3)
Chloride: 98 mmol/L (ref 98–111)
Creatinine, Ser: 0.87 mg/dL (ref 0.44–1.00)
GFR calc Af Amer: >60 mL/min (ref >60)
GFR calc non Af Amer: >60 mL/min (ref >60)
Glucose, Bld: 89 mg/dL (ref 70–99)
Potassium: 3.8 mmol/L (ref 3.5–5.1)
Sodium: 137 mmol/L (ref 135–145)

## 2019-08-18 LAB — PROTIME-INR
INR: 1.6 — ABNORMAL HIGH (ref 0.8–1.2)
Prothrombin Time: 18.9 seconds — ABNORMAL HIGH (ref 11.4–15.2)

## 2019-08-18 LAB — CBC
HCT: 43.4 % (ref 36.0–46.0)
Hemoglobin: 13.3 g/dL (ref 12.0–15.0)
MCH: 27.5 pg (ref 26.0–34.0)
MCHC: 30.6 g/dL (ref 30.0–36.0)
MCV: 89.9 fL (ref 80.0–100.0)
Platelets: 428 10*3/uL — ABNORMAL HIGH (ref 150–400)
RBC: 4.83 MIL/uL (ref 3.87–5.11)
RDW: 14 % (ref 11.5–15.5)
WBC: 8.5 10*3/uL (ref 4.0–10.5)
nRBC: 0 % (ref 0.0–0.2)

## 2019-08-18 NOTE — Patient Instructions (Signed)
Medication Instructions:  Your physician recommends that you continue on your current medications as directed. Please refer to the Current Medication list given to you today.  *If you need a refill on your cardiac medications before your next appointment, please call your pharmacy*   Lab Work: Your physician recommends that you continue on your current medications as directed. Please refer to the Current Medication list given to you today.. If you have labs (blood work) drawn today and your tests are completely normal, you will receive your results only by: Marland Kitchen MyChart Message (if you have MyChart) OR . A paper copy in the mail If you have any lab test that is abnormal or we need to change your treatment, we will call you to review the results.   Testing/Procedures: Your physician has recommended that you have a Cardioversion (DCCV). Electrical Cardioversion uses a jolt of electricity to your heart either through paddles or wired patches attached to your chest. This is a controlled, usually prescheduled, procedure. Defibrillation is done under light anesthesia in the hospital, and you usually go home the day of the procedure. This is done to get your heart back into a normal rhythm. You are not awake for the procedure. Please see the instruction sheet given to you today.     Follow-Up: At [facility], you and your health needs are our priority.  As part of our continuing mission to provide you with exceptional heart care, we have created designated Provider Care Teams.  These Care Teams include your primary Cardiologist (physician) and Advanced Practice Providers (APPs -  Physician Assistants and Nurse Practitioners) who all work together to provide you with the care you need, when you need it.  We recommend signing up for the patient portal called "MyChart".  Sign up information is provided on this After Visit Summary.  MyChart is used to connect with patients for Virtual Visits  (Telemedicine).  Patients are able to view lab/test results, encounter notes, upcoming appointments, etc.  Non-urgent messages can be sent to your provider as well.   To learn more about what you can do with MyChart, go to [email or web address].    Your next appointment:   5 week(s)  The format for your next appointment:   In Person  Provider:   You may see Jenkins Rouge, MD or one of the following Advanced Practice Providers on your designated Care Team:    Bernerd Pho, PA-C   Ermalinda Barrios, PA-C     Other Instructions Thank you for choosing [facility]!

## 2019-08-18 NOTE — Progress Notes (Signed)
Cardiology Office Note    Date:  08/19/2019   ID:  Mallory Sutton, DOB 12-Feb-1961, MRN [id number]  PCP:  Celene Squibb, MD  Cardiologist: Jenkins Rouge, MD    Chief Complaint  Patient presents with  . Follow-up    to arrange for DCCV    History of Present Illness:    Mallory Sutton is a 59 y.o. female with past medical history of congenital heart disease (s/p coarctation repair at age 46 - not felt to require surgical intervention at the time of surgery in 2016), bicuspid aortic valve (s/p mechanical AVR with root replacement in 07/2014 with MAZE), paroxysmal atrial fibrillation (on Rythmol and Coumadin), and HTN who presents to the office today to arrange for upcoming DCCV.  She was examined by Katina Dung, NP on 07/05/2019 for evaluation of worsening palpitations which started several days prior. Reported heart rate have been elevated in the low 100's to 160's and pulse was elevated to 121 at the time of her visit. EKG was most consistent with atrial flutter with RVR. The case was reviewed with Dr. [name] who recommended a TEE/DCCV given that her last INR was subtherapeutic at 1.8. It was also recommended that she follow-up with Dr. Lovena Le after the procedure to discuss different medication options as she was already on Rythmol. The procedure was scheduled for Dr. Radford Pax on 07/12/2019 but she was found to have a probable thrombus in the apex of the left atrial appendage and DCCV was canceled. It was recommended that she have weekly INR checks and to reschedule her DCCV once 3-4 weeks out. Notes mentioned she would not require a repeat TEE and I did verify that with Dr. [name].   In talking with the patient today, she reports continuing to experience intermittent palpitations and HR has been variable from he 70's to 120's when checked at home. She does have dyspnea at times with this but denies any chest pain. No recent orthopnea, PND or edema.   She reports good compliance with Coumadin and denies  missing any recent doses. No recent melena, hematochezia or hematuria.   Past Medical History:  Diagnosis Date  . Aortic stenosis   . ATRIAL ARRHYTHMIAS   . Bicuspid aortic valve   . CHF (congestive heart failure) ([place])   . COARCTATION OF AORTA   . ENDOMETRIOSIS   . Heart murmur   . Paroxysmal atrial fibrillation ([facility])   . Shortness of breath   . THORACIC AORTIC ANEURYSM     Past Surgical History:  Procedure Laterality Date  . coarctation repair and residual restenosis    . KNEE SURGERY    . TEE WITHOUT CARDIOVERSION N/A 07/12/2019   Procedure: TRANSESOPHAGEAL ECHOCARDIOGRAM (TEE);  Surgeon: Sueanne Margarita, MD;  Location: [facility] ENDOSCOPY;  Service: Cardiovascular;  Laterality: N/A;    Current Medications: Outpatient Medications Prior to Visit  Medication Sig Dispense Refill  . aspirin EC 81 MG tablet Take 81 mg by mouth daily.    . diazepam (VALIUM) 5 MG tablet Take 1 tablet (5 mg total) by mouth daily as needed for anxiety. 30 tablet 0  . furosemide (LASIX) 40 MG tablet Take 40 mg by mouth daily.    Marland Kitchen gabapentin (NEURONTIN) 300 MG capsule Take 300 mg by mouth See admin instructions. Take 300 mg in the morning, 300 mg in the afternoon   600 mg at bedtime    . metoprolol succinate (TOPROL-XL) 50 MG 24 hr tablet Take 1 tablet (50 mg total)  by mouth in the morning and at bedtime. Take with or immediately following a meal. (Patient taking differently: Take 25 mg by mouth in the morning and at bedtime. Take with or immediately following a meal.) 60 tablet 11  . Multiple Vitamin (MULTI-VITAMINS) TABS Take by mouth daily.    . propafenone (RYTHMOL) 225 MG tablet TAKE (1) TABLET BY MOUTH (3) TIMES DAILY. (Patient taking differently: Take 225 mg by mouth 3 (three) times daily. ) 90 tablet 6  . traMADol (ULTRAM) 50 MG tablet Take 1 tablet (50 mg total) by mouth 3 (three) times daily. (Patient taking differently: Take 50 mg by mouth 3 (three) times daily as needed for moderate pain. ) 90 tablet 3   . triamcinolone cream (KENALOG) 0.1 % Apply 1 application topically daily as needed (Vasculitis).     . trolamine salicylate (ASPERCREME) 10 % cream Apply 1 application topically as needed for muscle pain (Back pain).    Marland Kitchen warfarin (COUMADIN) 1 MG tablet TAKE 3 TABLETS DAILY, OR AS DIRECTED. (Patient taking differently: Take 3 mg by mouth at bedtime. ) 100 tablet 2   No facility-administered medications prior to visit.     Allergies:   Amiodarone and Penicillins   Social History   Socioeconomic History  . Marital status: Married    Spouse name: Not on file  . Number of children: Not on file  . Years of education: Not on file  . Highest education level: Not on file  Occupational History  . Not on file  Tobacco Use  . Smoking status: Never Smoker  . Smokeless tobacco: Never Used  Substance and Sexual Activity  . Alcohol use: Yes    Alcohol/week: 0.0 standard drinks    Comment: 3 beers 3 nights a week  . Drug use: No  . Sexual activity: Never  Other Topics Concern  . Not on file  Social History Narrative  . Not on file   Social Determinants of Health   Financial Resource Strain:   . Difficulty of Paying Living Expenses:   Food Insecurity:   . Worried About Charity fundraiser in the Last Year:   . Arboriculturist in the Last Year:   Transportation Needs:   . Film/video editor (Medical):   Marland Kitchen Lack of Transportation (Non-Medical):   Physical Activity:   . Days of Exercise per Week:   . Minutes of Exercise per Session:   Stress:   . Feeling of Stress :   Social Connections:   . Frequency of Communication with Friends and Family:   . Frequency of Social Gatherings with Friends and Family:   . Attends Religious Services:   . Active Member of Clubs or Organizations:   . Attends Archivist Meetings:   Marland Kitchen Marital Status:      Family History:  The patient's Family history is unknown by patient.   Review of Systems:   Please see the history of present  illness.     General:  No chills, fever, night sweats or weight changes.  Cardiovascular:  No chest pain, edema, orthopnea, paroxysmal nocturnal dyspnea. Positive for palpitations and dyspnea.  Dermatological: No rash, lesions/masses Respiratory: No cough.  Urologic: No hematuria, dysuria Abdominal:   No nausea, vomiting, diarrhea, bright red blood per rectum, melena, or hematemesis Neurologic:  No visual changes, wkns, changes in mental status. All other systems reviewed and are otherwise negative except as noted above.   Physical Exam:    VS:  BP  130/70 (BP Location: Left Arm)   Pulse 95   Temp 97.6 F (36.4 C)   Ht _0  (1.626 m)   Wt 112 lb (50.8 kg)   LMP 07/16/2013   SpO2 94%   BMI 19.22 kg/m    General: Well developed, well nourished,female appearing in no acute distress. Head: Normocephalic, atraumatic, sclera non-icteric.  Neck: No carotid bruits. JVD not elevated.  Lungs: Respirations regular and unlabored, without wheezes or rales.  Heart: Regular rhythm, tachycardiac rate. No S3 or S4. No rubs or gallops appreciated. Crisp mechanical valve sounds. 2/6 SEM along RUSB.  Abdomen: Soft, non-tender, non-distended. No obvious abdominal masses. Msk:  Strength and tone appear normal for age. No obvious joint deformities or effusions. Extremities: No clubbing or cyanosis. No edema.  Distal pedal pulses are 2+ bilaterally. Neuro: Alert and oriented X 3. Moves all extremities spontaneously. No focal deficits noted. Psych:  Responds to questions appropriately with a normal affect. Skin: No rashes or lesions noted  Wt Readings from Last 3 Encounters:  08/18/19 112 lb (50.8 kg)  07/12/19 107 lb (48.5 kg)  07/05/19 107 lb (48.5 kg)     Studies/Labs Reviewed:   EKG:  EKG is ordered today.  The ekg ordered today demonstrates what appears most consistent with 2:1 atrial flutter when compared to prior tracings and HR 103.  Recent Labs: 08/18/2019: BUN 13; Creatinine, Ser 0.87;  Hemoglobin 13.3; Platelets 428; Potassium 3.8; Sodium 137   Lipid Panel    Component Value Date/Time   CHOL 144 01/23/2015 0913   TRIG 87 01/23/2015 0913   HDL 44 01/23/2015 0913   CHOLHDL 3.3 01/23/2015 0913   VLDL 17 01/23/2015 0913   LDLCALC 83 01/23/2015 0913    Additional studies/ records that were reviewed today include:   Echocardiogram: 07/2017 Study Conclusions   - Left ventricle: The cavity size was normal. Wall thickness was  increased in a pattern of mild LVH. Systolic function was normal.  The estimated ejection fraction was in the range of 60% to 65%.  - Aortic valve: Poorly visualized mechanical AVR with root  replacement Stable gradients and trivial AR DVI .53 suggesting  normal function. Valve area (VTI): 1.4 cm^2. Valve area (Vmax):  1.33 cm^2. Valve area (Vmean): 1.38 cm^2.  - Mitral valve: Appears to be some exuberant MAC Do not think this  represents vegetation&'s  - Atrial septum: No defect or patent foramen ovale was identified.  - Impressions: Suprasternal notch not well interrogated but CW  velocity close to 4 m/sec suggesting persistent coarctation   Impressions:   - Suprasternal notch not well interrogated but CW velocity close to  4 m/sec suggesting persistent coarctation   TEE: 07/12/2019 IMPRESSIONS    1. Left ventricular ejection fraction, by estimation, is 60 to 65%. The  left ventricle has normal function. The left ventricle has no regional  wall motion abnormalities.  2. Right ventricular systolic function is normal. The right ventricular  size is normal.  3. A left atrial/left atrial appendage thrombus was detected. There was  heavy spontaneous echo contrast swirling in the LAA with probable thrombus  in the apex with fibrin strands extending into the body of the appendage  4. The mitral valve is normal in structure. Mild mitral valve  regurgitation. No evidence of mitral stenosis.  5. The aortic valve has been  repaired/replaced. There is a normal  appearing mechanical valve present in the aortic position. Aortic valve  regurgitation is not visualized.  6. S/P aortic  root replacement with no evidence of dilatation or  obstruction. Unable to visualize coarctation in descending aorta.   Assessment:    1. Atrial flutter, unspecified type ([place])   2. Medication management   3. S/P aortic valve replacement   4. Coarctation of aorta (preductal) (postductal)   5. Essential hypertension      Plan:   In order of problems listed above:  1. Atrial Flutter with known History of Paroxysmal Atrial Fibrillation - she developed palpitations in late February and was found to be in atrial flutter at the time of her visit. TEE/DCCV was arranged but DCCV was cancelled given probable thrombus in the apex of the left atrial appendage. She has remained on Coumadin with therapeutic INR's since. Due for repeat today per Edrick Oh and will have checked. Risks and benefits of DCCV reviewed and she agrees to proceed. Prefers to have [facility] at [facility]. Will recheck INR today along pre-procedure CBC and BMET. EKG was obtained today as rhythm was regular by examination but EKG is still most consistent with atrial flutter. - remains on Rythmol 243m TID and Toprol-XL 554mBID. Will eventually need EP follow-up to discuss antiarrhythmic options to see if she should remain on Rythmol or if alternatives should be considered. Previously intolerant to Amiodarone.   ADDENDUM: INR checked and resulted as subtherapeutic at 1.6. Coumadin dosing adjusted as outlined in result note and I will have Mallory Sutton with the patient on Monday. I will also reach out to Dr. NiJohnsie Cancelo see if he wishes to change her procedure to TEE/DCCV given her subtherapeutic INR as she does not wish to wait an additional 4 weeks to have the procedure performed.   2. Aortic Valve Replacement - she is s/p mechanical AVR with root replacement in 07/2014.  TEE last month showed normal valve function. Remains on Coumadin with goal INR 2.0 - 3.0. Will recheck INR today.  3. Coarctation of the Aorta - s/p coarctation repair at age [age] not felt to require surgical intervention at the time of surgery in 2016. Echocardiogram in 2019 showed CW velocity close to4 m/sec suggesting persistent coarctation. Continue to follow with routine echocardiogram imaging.    4. HTN - BP is well-controlled at 130/70 during today's visit. Continue Toprol-XL 501mID.   Medication Adjustments/Labs and Tests Ordered: Current medicines are reviewed at length with the patient today.  Concerns regarding medicines are outlined above.  Medication changes, Labs and Tests ordered today are listed in the Patient Instructions below. Patient Instructions  Medication Instructions:  Your physician recommends that you continue on your current medications as directed. Please refer to the Current Medication list given to you today.  *If you need a refill on your cardiac medications before your next appointment, please call your pharmacy*   Lab Work: Your physician recommends that you continue on your current medications as directed. Please refer to the Current Medication list given to you today.. If you have labs (blood work) drawn today and your tests are completely normal, you will receive your results only by: . MMarland KitchenChart Message (if you have MyChart) OR . A paper copy in the mail If you have any lab test that is abnormal or we need to change your treatment, we will call you to review the results.   Testing/Procedures: Your physician has recommended that you have a Cardioversion (DCCV). Electrical Cardioversion uses a jolt of electricity to your heart either through paddles or wired patches attached to your chest. This  is a controlled, usually prescheduled, procedure. Defibrillation is done under light anesthesia in the hospital, and you usually go home the day of the procedure.  This is done to get your heart back into a normal rhythm. You are not awake for the procedure. Please see the instruction sheet given to you today.     Follow-Up: At [facility], you and your health needs are our priority.  As part of our continuing mission to provide you with exceptional heart care, we have created designated Provider Care Teams.  These Care Teams include your primary Cardiologist (physician) and Advanced Practice Providers (APPs -  Physician Assistants and Nurse Practitioners) who all work together to provide you with the care you need, when you need it.  We recommend signing up for the patient portal called "MyChart".  Sign up information is provided on this After Visit Summary.  MyChart is used to connect with patients for Virtual Visits (Telemedicine).  Patients are able to view lab/test results, encounter notes, upcoming appointments, etc.  Non-urgent messages can be sent to your provider as well.   To learn more about what you can do with MyChart, go to [email or web address].    Your next appointment:   5 week(s)  The format for your next appointment:   In Person  Provider:   You may see Jenkins Rouge, MD or one of the following Advanced Practice Providers on your designated Care Team:    Bernerd Pho, PA-C   Ermalinda Barrios, PA-C     Other Instructions Thank you for choosing [facility]!       Signed, Erma Heritage, PA-C  08/19/2019 9:36 AM    [place] [address] [place], [place] [address] Phone: [phone] Fax: [phone]

## 2019-08-19 ENCOUNTER — Encounter: Payer: Self-pay | Admitting: Student

## 2019-08-21 ENCOUNTER — Telehealth: Payer: Self-pay | Admitting: *Deleted

## 2019-08-21 ENCOUNTER — Ambulatory Visit: Payer: Self-pay | Admitting: *Deleted

## 2019-08-21 NOTE — Telephone Encounter (Signed)
Spoke with patient about subtherapeutic INR on 4/16.  She did take a booster dose as ordered by Bernerd Pho PAC.  Told pt to continue coumadin 3mg  daily except on Wednesday 4/21 to take 4mg .  She verbalized understanding.   INR appt made for 4/22 at 3:30pm.  Still waiting on response from Dr [name] on scheduling TEE/DCCV.

## 2019-08-21 NOTE — Patient Instructions (Signed)
Continue warfarin 3mg  daily except take 4mg  Wednesday 4/22 before INR appt on 4/22. She verbalized understanding. Continue greens Waiting to hear back from [place] PA-C (Dr [name])  to see when TEE/DCCV will be scheduled

## 2019-08-22 ENCOUNTER — Other Ambulatory Visit: Payer: Self-pay | Admitting: Student

## 2019-08-22 ENCOUNTER — Telehealth: Payer: Self-pay | Admitting: Student

## 2019-08-22 DIAGNOSIS — I4892 Unspecified atrial flutter: Secondary | ICD-10-CM

## 2019-08-22 DIAGNOSIS — Q231 Congenital insufficiency of aortic valve: Secondary | ICD-10-CM

## 2019-08-22 NOTE — Telephone Encounter (Signed)
     I did review the patient with Dr. [name] given her subtherapeutic INR by most recent labs. He did recommend proceeding with TEE/DCCV. Informed the patient of this today and she is in agreement to proceed. Risks and benefits reviewed. Her procedure has been scheduled for 08/29/2019. She has preadmission testing along with Covid testing this coming Friday and requests to have her INR checked at that time instead of on Thursday given transportation issues. I will message Edrick Oh today to make her aware.  The patient voiced understanding of the above information and was appreciative of the call.   Signed, Erma Heritage, PA-C 08/22/2019, 12:01 PM Pager: [phone]

## 2019-08-24 NOTE — Patient Instructions (Signed)
Mallory Sutton  08/24/2019     @PREFPERIOPPHARMACY @   Your procedure is scheduled on  08/29/2019 .  Report to [facility] at  0830  A.M.  Call this number if you have problems the morning of surgery:  [phone]   Remember:  Do not eat or drink after midnight.                    Take these medicines the morning of surgery with A SIP OF WATER  Gabapentin, rythmol, tramadol(if needed).    Do not wear jewelry, make-up or nail polish.  Do not wear lotions, powders, or perfumes. Please wear deodorant and brush your teeth  Do not shave 48 hours prior to surgery.  Men may shave face and neck.  Do not bring valuables to the hospital.  [facility] is not responsible for any belongings or valuables.  Contacts, dentures or bridgework may not be worn into surgery.  Leave your suitcase in the car.  After surgery it may be brought to your room.  For patients admitted to the hospital, discharge time will be determined by your treatment team.  Patients discharged the day of surgery will not be allowed to drive home.   Name and phone number of your driver:   family Special instructions:  DO NOT smoke the day of your procedure.  Please read over the following fact sheets that you were given. Anesthesia Post-op Instructions and Care and Recovery After Surgery         Transesophageal Echocardiogram Transesophageal echocardiogram (TEE) is a test that uses sound waves to take pictures of your heart. TEE is done by passing a flexible tube down the esophagus. The esophagus is the tube that carries food from the throat to the stomach. The pictures give detailed images of your heart. This can help your doctor see if there are problems with your heart. What happens before the procedure?  General instructions  You will need to take out any dentures or retainers.  Plan to have someone take you home from the hospital or clinic.  If you will be going home right after the procedure, plan to  have someone with you for 24 hours.  Ask your doctor about: ? Changing or stopping your normal medicines. This is important if you take diabetes medicines or blood thinners. ? Taking over-the-counter medicines, vitamins, herbs, and supplements. ? Taking medicines such as aspirin and ibuprofen. These medicines can thin your blood. Do not take these medicines unless your doctor tells you to take them. What happens during the procedure?  To lower your risk of infection, your doctors will wash or clean their hands.  An IV will be put into one of your veins.  You will be given a medicine to help you relax (sedative).  A medicine may be sprayed or gargled. This numbs the back of your throat.  Your blood pressure, heart rate, and breathing will be watched.  You may be asked to lay on your left side.  A bite block will be placed in your mouth. This keeps you from biting the tube.  The tip of the TEE probe will be placed into the back of your mouth.  You will be asked to swallow.  Your doctor will take pictures of your heart.  The probe and bite block will be taken out. The procedure may vary among doctors and hospitals. What happens after the procedure?   Your blood pressure, heart  rate, breathing rate, and blood oxygen level will be watched until the medicines you were given have worn off.  When you first wake up, your throat may feel sore and numb. This will get better over time. You will not be allowed to eat or drink until the numbness has gone away.  Do not drive for 24 hours if you were given a medicine to help you relax. Summary  TEE is a test that uses sound waves to take pictures of your heart.  You will be given a medicine to help you relax.  Do not drive for 24 hours if you were given a medicine to help you relax. This information is not intended to replace advice given to you by your health care provider. Make sure you discuss any questions you have with your health  care provider. Document Revised: 01/07/2018 Document Reviewed: 07/22/2016 Elsevier Patient Education  [place].  Hospital doctor cardioversion is the delivery of a jolt of electricity to restore a normal rhythm to the heart. A rhythm that is too fast or is not regular keeps the heart from pumping well. In this procedure, sticky patches or metal paddles are placed on the chest to deliver electricity to the heart from a device. This procedure may be done in an emergency if:  There is low or no blood pressure as a result of the heart rhythm.  Normal rhythm must be restored as fast as possible to protect the brain and heart from further damage.  It may save a life. This may also be a scheduled procedure for irregular or fast heart rhythms that are not immediately life-threatening. Tell a health care provider about:  Any allergies you have.  All medicines you are taking, including vitamins, herbs, eye drops, creams, and over-the-counter medicines.  Any problems you or family members have had with anesthetic medicines.  Any blood disorders you have.  Any surgeries you have had.  Any medical conditions you have.  Whether you are pregnant or may be pregnant. What are the risks? Generally, this is a safe procedure. However, problems may occur, including:  Allergic reactions to medicines.  A blood clot that breaks free and travels to other parts of your body.  The possible return of an abnormal heart rhythm within hours or days after the procedure.  Your heart stopping (cardiac arrest). This is rare. What happens before the procedure? Medicines  Your health care provider may have you start taking: ? Blood-thinning medicines (anticoagulants) so your blood does not clot as easily. ? Medicines to help stabilize your heart rate and rhythm.  Ask your health care provider about: ? Changing or stopping your regular medicines. This is especially important if  you are taking diabetes medicines or blood thinners. ? Taking medicines such as aspirin and ibuprofen. These medicines can thin your blood. Do not take these medicines unless your health care provider tells you to take them. ? Taking over-the-counter medicines, vitamins, herbs, and supplements. General instructions  Follow instructions from your health care provider about eating or drinking restrictions.  Plan to have someone take you home from the hospital or clinic.  If you will be going home right after the procedure, plan to have someone with you for 24 hours.  Ask your health care provider what steps will be taken to help prevent infection. These may include washing your skin with a germ-killing soap. What happens during the procedure?   An IV will be inserted into one of your  veins.  Sticky patches (electrodes) or metal paddles may be placed on your chest.  You will be given a medicine to help you relax (sedative).  An electrical shock will be delivered. The procedure may vary among health care providers and hospitals. What can I expect after the procedure?  Your blood pressure, heart rate, breathing rate, and blood oxygen level will be monitored until you leave the hospital or clinic.  Your heart rhythm will be watched to make sure it does not change.  You may have some redness on the skin where the shocks were given. Follow these instructions at home:  Do not drive for 24 hours if you were given a sedative during your procedure.  Take over-the-counter and prescription medicines only as told by your health care provider.  Ask your health care provider how to check your pulse. Check it often.  Rest for 48 hours after the procedure or as told by your health care provider.  Avoid or limit your caffeine use as told by your health care provider.  Keep all follow-up visits as told by your health care provider. This is important. Contact a health care provider if:  You  feel like your heart is beating too quickly or your pulse is not regular.  You have a serious muscle cramp that does not go away. Get help right away if:  You have discomfort in your chest.  You are dizzy or you feel faint.  You have trouble breathing or you are short of breath.  Your speech is slurred.  You have trouble moving an arm or leg on one side of your body.  Your fingers or toes turn cold or blue. Summary  Electrical cardioversion is the delivery of a jolt of electricity to restore a normal rhythm to the heart.  This procedure may be done right away in an emergency or may be a scheduled procedure if the condition is not an emergency.  Generally, this is a safe procedure.  After the procedure, check your pulse often as told by your health care provider. This information is not intended to replace advice given to you by your health care provider. Make sure you discuss any questions you have with your health care provider. Document Revised: 11/21/2018 Document Reviewed: 11/21/2018 Elsevier Patient Education  [place] After These instructions provide you with information about caring for yourself after your procedure. Your health care provider may also give you more specific instructions. Your treatment has been planned according to current medical practices, but problems sometimes occur. Call your health care provider if you have any problems or questions after your procedure. What can I expect after the procedure? After your procedure, you may:  Feel sleepy for several hours.  Feel clumsy and have poor balance for several hours.  Feel forgetful about what happened after the procedure.  Have poor judgment for several hours.  Feel nauseous or vomit.  Have a sore throat if you had a breathing tube during the procedure. Follow these instructions at home: For at least 24 hours after the procedure:      Have a responsible  adult stay with you. It is important to have someone help care for you until you are awake and alert.  Rest as needed.  Do not: ? Participate in activities in which you could fall or become injured. ? Drive. ? Use heavy machinery. ? Drink alcohol. ? Take sleeping pills or medicines that cause drowsiness. ? Make important  decisions or sign legal documents. ? Take care of children on your own. Eating and drinking  Follow the diet that is recommended by your health care provider.  If you vomit, drink water, juice, or soup when you can drink without vomiting.  Make sure you have little or no nausea before eating solid foods. General instructions  Take over-the-counter and prescription medicines only as told by your health care provider.  If you have sleep apnea, surgery and certain medicines can increase your risk for breathing problems. Follow instructions from your health care provider about wearing your sleep device: ? Anytime you are sleeping, including during daytime naps. ? While taking prescription pain medicines, sleeping medicines, or medicines that make you drowsy.  If you smoke, do not smoke without supervision.  Keep all follow-up visits as told by your health care provider. This is important. Contact a health care provider if:  You keep feeling nauseous or you keep vomiting.  You feel light-headed.  You develop a rash.  You have a fever. Get help right away if:  You have trouble breathing. Summary  For several hours after your procedure, you may feel sleepy and have poor judgment.  Have a responsible adult stay with you for at least 24 hours or until you are awake and alert. This information is not intended to replace advice given to you by your health care provider. Make sure you discuss any questions you have with your health care provider. Document Revised: 07/19/2017 Document Reviewed: 08/11/2015 Elsevier Patient Education  [place].

## 2019-08-25 ENCOUNTER — Other Ambulatory Visit (HOSPITAL_COMMUNITY)
Admission: RE | Admit: 2019-08-25 | Discharge: 2019-08-25 | Disposition: A | Discharge status: Home / Self Care | Payer: HRSA Program | Source: Ambulatory Visit | Attending: Cardiology | Admitting: Cardiology

## 2019-08-25 ENCOUNTER — Encounter (HOSPITAL_COMMUNITY)
Admission: RE | Admit: 2019-08-25 | Discharge: 2019-08-25 | Disposition: A | Discharge status: Home / Self Care | Payer: Self-pay | Source: Ambulatory Visit | Attending: Cardiology | Admitting: Cardiology

## 2019-08-25 ENCOUNTER — Other Ambulatory Visit: Payer: Self-pay

## 2019-08-25 ENCOUNTER — Telehealth: Payer: Self-pay | Admitting: Student

## 2019-08-25 ENCOUNTER — Ambulatory Visit (INDEPENDENT_AMBULATORY_CARE_PROVIDER_SITE_OTHER): Payer: Self-pay | Admitting: Internal Medicine

## 2019-08-25 DIAGNOSIS — Z5181 Encounter for therapeutic drug level monitoring: Secondary | ICD-10-CM

## 2019-08-25 DIAGNOSIS — Z20822 Contact with and (suspected) exposure to covid-19: Secondary | ICD-10-CM | POA: Insufficient documentation

## 2019-08-25 DIAGNOSIS — Z01812 Encounter for preprocedural laboratory examination: Secondary | ICD-10-CM | POA: Diagnosis present

## 2019-08-25 LAB — BASIC METABOLIC PANEL
Anion gap: 9 (ref 5–15)
BUN: 13 mg/dL (ref 6–20)
CO2: 29 mmol/L (ref 22–32)
Calcium: 8.5 mg/dL — ABNORMAL LOW (ref 8.9–10.3)
Chloride: 99 mmol/L (ref 98–111)
Creatinine, Ser: 0.88 mg/dL (ref 0.44–1.00)
GFR calc Af Amer: >60 mL/min (ref >60)
GFR calc non Af Amer: >60 mL/min (ref >60)
Glucose, Bld: 105 mg/dL — ABNORMAL HIGH (ref 70–99)
Potassium: 3.2 mmol/L — ABNORMAL LOW (ref 3.5–5.1)
Sodium: 137 mmol/L (ref 135–145)

## 2019-08-25 LAB — PROTIME-INR
INR: 3 — ABNORMAL HIGH (ref 0.8–1.2)
Prothrombin Time: 31.4 seconds — ABNORMAL HIGH (ref 11.4–15.2)

## 2019-08-25 NOTE — Telephone Encounter (Signed)
     Called the patient and questions answered. Was provided a time to arrive for DCCV at the time of her pre-op appointment. A message has been sent to Dr. [name] in regards to her Valium.  Signed, Erma Heritage, PA-C 08/25/2019, 4:32 PM Pager: [phone]

## 2019-08-25 NOTE — Telephone Encounter (Signed)
Will forward to Brittany Strader, PA-C.  

## 2019-08-25 NOTE — Telephone Encounter (Signed)
Returned pt call. She has a few questions: She is scheduled for covid shot, how long after cardioversion should she wait to get it. Also she asked for a refill on her valium, I deferred that back to Dr. [name]. She still does not have a time for cardioversion.

## 2019-08-25 NOTE — Telephone Encounter (Signed)
Pt called requesting to speak w/ [place]   [phone]

## 2019-08-25 NOTE — Patient Instructions (Signed)
Description   Called and spoke to pt instructed her to continue her normal dose of warfarin 3 mg daily. She verbalized understanding.  Recheck INR 1 week post cardioversion. Call clinic with any changes in medications or upcoming procedures. I

## 2019-08-26 LAB — SARS CORONAVIRUS 2 (TAT 6-24 HRS): SARS Coronavirus 2: NEGATIVE

## 2019-08-28 ENCOUNTER — Telehealth: Payer: Self-pay

## 2019-08-28 NOTE — Telephone Encounter (Signed)
Called in prescription to [facility] for valium 5 mg by mouth daily as needed for anxiety, 30 tablets, with 0 refills.

## 2019-08-28 NOTE — Telephone Encounter (Signed)
-----   Message from Josue Hector, MD sent at 08/25/2019  4:38 PM EDT ----- Jeannene Patella we can refill on Monday  in office for 30 tabs no refills ----- Message ----- From: Erma Heritage, PA-C Sent: 08/25/2019   4:33 PM EDT To: Josue Hector, MD, Michaelyn Barter, RN  Good afternoon,  This patient was requesting refills of the the Valium previously sent in. She had 30 tablets sent in on 4/8 and only has a few left as she reports taking extra at night to help with sleep. I do not typically write for these medications in the outpatient setting and we do not have [employer] scanners in the office to send in, therefore I will defer the decision on this to Dr. [name].   [place], [place]

## 2019-08-29 ENCOUNTER — Ambulatory Visit (HOSPITAL_COMMUNITY)
Admission: RE | Admit: 2019-08-29 | Discharge: 2019-08-29 | Disposition: A | Discharge status: Home / Self Care | Payer: Self-pay | Attending: Cardiology | Admitting: Cardiology

## 2019-08-29 ENCOUNTER — Ambulatory Visit (HOSPITAL_COMMUNITY): Payer: Self-pay | Admitting: Anesthesiology

## 2019-08-29 ENCOUNTER — Encounter (HOSPITAL_COMMUNITY)
Admission: RE | Disposition: A | Discharge status: Home / Self Care | Payer: Self-pay | Source: Home / Self Care | Attending: Cardiology

## 2019-08-29 ENCOUNTER — Encounter (HOSPITAL_COMMUNITY): Payer: Self-pay | Admitting: Cardiology

## 2019-08-29 ENCOUNTER — Other Ambulatory Visit: Payer: Self-pay

## 2019-08-29 ENCOUNTER — Ambulatory Visit (HOSPITAL_BASED_OUTPATIENT_CLINIC_OR_DEPARTMENT_OTHER): Payer: Self-pay

## 2019-08-29 DIAGNOSIS — I361 Nonrheumatic tricuspid (valve) insufficiency: Secondary | ICD-10-CM

## 2019-08-29 DIAGNOSIS — R791 Abnormal coagulation profile: Secondary | ICD-10-CM | POA: Insufficient documentation

## 2019-08-29 DIAGNOSIS — I340 Nonrheumatic mitral (valve) insufficiency: Secondary | ICD-10-CM

## 2019-08-29 DIAGNOSIS — Z7982 Long term (current) use of aspirin: Secondary | ICD-10-CM | POA: Insufficient documentation

## 2019-08-29 DIAGNOSIS — Z952 Presence of prosthetic heart valve: Secondary | ICD-10-CM | POA: Insufficient documentation

## 2019-08-29 DIAGNOSIS — I110 Hypertensive heart disease with heart failure: Secondary | ICD-10-CM | POA: Insufficient documentation

## 2019-08-29 DIAGNOSIS — I480 Paroxysmal atrial fibrillation: Secondary | ICD-10-CM | POA: Insufficient documentation

## 2019-08-29 DIAGNOSIS — Z79899 Other long term (current) drug therapy: Secondary | ICD-10-CM | POA: Insufficient documentation

## 2019-08-29 DIAGNOSIS — I712 Thoracic aortic aneurysm, without rupture: Secondary | ICD-10-CM | POA: Insufficient documentation

## 2019-08-29 DIAGNOSIS — I4892 Unspecified atrial flutter: Secondary | ICD-10-CM

## 2019-08-29 DIAGNOSIS — Z8774 Personal history of (corrected) congenital malformations of heart and circulatory system: Secondary | ICD-10-CM | POA: Insufficient documentation

## 2019-08-29 DIAGNOSIS — I509 Heart failure, unspecified: Secondary | ICD-10-CM | POA: Insufficient documentation

## 2019-08-29 HISTORY — PX: TEE WITHOUT CARDIOVERSION: SHX5443

## 2019-08-29 HISTORY — PX: CARDIOVERSION: SHX1299

## 2019-08-29 SURGERY — CARDIOVERSION
Anesthesia: General

## 2019-08-29 MED ORDER — KETAMINE HCL 50 MG/5ML IJ SOSY
PREFILLED_SYRINGE | INTRAMUSCULAR | Status: AC
  Filled 2019-08-29: qty 5

## 2019-08-29 MED ORDER — MIDAZOLAM HCL 2 MG/2ML IJ SOLN
INTRAMUSCULAR | Status: AC
  Filled 2019-08-29: qty 2

## 2019-08-29 MED ORDER — LACTATED RINGERS IV SOLN: Freq: Once | INTRAVENOUS | Status: AC

## 2019-08-29 MED ORDER — SODIUM CHLORIDE 0.9 % IV SOLN: INTRAVENOUS | Status: DC

## 2019-08-29 MED ORDER — MIDAZOLAM HCL 2 MG/2ML IJ SOLN
INTRAMUSCULAR | Status: DC | PRN
  Administered 2019-08-29: 1 mg via INTRAVENOUS

## 2019-08-29 MED ORDER — PERFLUTREN LIPID MICROSPHERE
1.0000 mL | INTRAVENOUS | Status: DC | PRN
  Administered 2019-08-29: 1 mL via INTRAVENOUS

## 2019-08-29 MED ORDER — PHENYLEPHRINE HCL (PRESSORS) 10 MG/ML IV SOLN
INTRAVENOUS | Status: DC | PRN
  Administered 2019-08-29: 40 ug via INTRAVENOUS

## 2019-08-29 MED ORDER — MIDAZOLAM HCL 2 MG/2ML IJ SOLN
2.0000 mg | Freq: Once | INTRAMUSCULAR | Status: AC
  Administered 2019-08-29: 2 mg via INTRAVENOUS

## 2019-08-29 MED ORDER — PROPOFOL 500 MG/50ML IV EMUL
INTRAVENOUS | Status: DC | PRN
  Administered 2019-08-29: 20 ug via INTRAVENOUS
  Administered 2019-08-29: 20 mg via INTRAVENOUS
  Administered 2019-08-29: 100 ug/kg/min via INTRAVENOUS

## 2019-08-29 MED ORDER — LIDOCAINE VISCOUS HCL 2 % MT SOLN
OROMUCOSAL | Status: AC
  Filled 2019-08-29: qty 15

## 2019-08-29 NOTE — Progress Notes (Signed)
*  PRELIMINARY RESULTS* Echocardiogram Echocardiogram Transesophageal with definity has been performed.  Leavy Cella 08/29/2019, 11:27 AM

## 2019-08-29 NOTE — H&P (Signed)
Patient presents for TEE/cardioversion today for aflutter. Chart reviewed. Has been on coumadin but subtherapeutic INR last week and thus requires TEE prior to DCCV. Last INR was 3. Will plan for TEE/DCCV today. Please see referenced recent clinical note for full history.  [name] MD    Cardiology Office Note   Date: 08/19/2019  ID: Mallory Sutton, DOB 1961/02/22, MRN [id number]  PCP: Celene Squibb, MD  Cardiologist: Jenkins Rouge, MD      Chief Complaint  Patient presents with  . Follow-up    to arrange for DCCV   History of Present Illness:   Mallory Sutton is a 59 y.o. female with past medical history of congenital heart disease (s/p coarctation repair at age 72 - not felt to require surgical intervention at the time of surgery in 2016), bicuspid aortic valve (s/p mechanical AVR with root replacement in 07/2014 with MAZE), paroxysmal atrial fibrillation (on Rythmol and Coumadin), and HTN who presents to the office today to arrange for upcoming DCCV.  She was examined by Katina Dung, NP on 07/05/2019 for evaluation of worsening palpitations which started several days prior. Reported heart rate have been elevated in the low 100's to 160's and pulse was elevated to 121 at the time of her visit. EKG was most consistent with atrial flutter with RVR. The case was reviewed with Dr. [name] who recommended a TEE/DCCV given that her last INR was subtherapeutic at 1.8. It was also recommended that she follow-up with Dr. Lovena Le after the procedure to discuss different medication options as she was already on Rythmol. The procedure was scheduled for Dr. Radford Pax on 07/12/2019 but she was found to have a probable thrombus in the apex of the left atrial appendage and DCCV was canceled. It was recommended that she have weekly INR checks and to reschedule her DCCV once 3-4 weeks out. Notes mentioned she would not require a repeat TEE and I did verify that with Dr. [name].  In talking with the patient today, she  reports continuing to experience intermittent palpitations and HR has been variable from he 70's to 120's when checked at home. She does have dyspnea at times with this but denies any chest pain. No recent orthopnea, PND or edema.  She reports good compliance with Coumadin and denies missing any recent doses. No recent melena, hematochezia or hematuria.      Past Medical History:  Diagnosis Date  . Aortic stenosis   . ATRIAL ARRHYTHMIAS   . Bicuspid aortic valve   . CHF (congestive heart failure) ([place])   . COARCTATION OF AORTA   . ENDOMETRIOSIS   . Heart murmur   . Paroxysmal atrial fibrillation ([facility])   . Shortness of breath   . THORACIC AORTIC ANEURYSM         Past Surgical History:  Procedure Laterality Date  . coarctation repair and residual restenosis    . KNEE SURGERY    . TEE WITHOUT CARDIOVERSION N/A 07/12/2019   Procedure: TRANSESOPHAGEAL ECHOCARDIOGRAM (TEE); Surgeon: Sueanne Margarita, MD; Location: [facility] ENDOSCOPY; Service: Cardiovascular; Laterality: N/A;   Current Medications:        Outpatient Medications Prior to Visit  Medication Sig Dispense Refill  . aspirin EC 81 MG tablet Take 81 mg by mouth daily.    . diazepam (VALIUM) 5 MG tablet Take 1 tablet (5 mg total) by mouth daily as needed for anxiety. 30 tablet 0  . furosemide (LASIX) 40 MG tablet Take 40 mg by mouth daily.    Marland Kitchen  gabapentin (NEURONTIN) 300 MG capsule Take 300 mg by mouth See admin instructions. Take 300 mg in the morning, 300 mg in the afternoon 600 mg at bedtime    . metoprolol succinate (TOPROL-XL) 50 MG 24 hr tablet Take 1 tablet (50 mg total) by mouth in the morning and at bedtime. Take with or immediately following a meal. (Patient taking differently: Take 25 mg by mouth in the morning and at bedtime. Take with or immediately following a meal.) 60 tablet 11  . Multiple Vitamin (MULTI-VITAMINS) TABS Take by mouth daily.    . propafenone (RYTHMOL) 225 MG tablet TAKE (1) TABLET BY MOUTH (3) TIMES DAILY.  (Patient taking differently: Take 225 mg by mouth 3 (three) times daily. ) 90 tablet 6  . traMADol (ULTRAM) 50 MG tablet Take 1 tablet (50 mg total) by mouth 3 (three) times daily. (Patient taking differently: Take 50 mg by mouth 3 (three) times daily as needed for moderate pain. ) 90 tablet 3  . triamcinolone cream (KENALOG) 0.1 % Apply 1 application topically daily as needed (Vasculitis).     . trolamine salicylate (ASPERCREME) 10 % cream Apply 1 application topically as needed for muscle pain (Back pain).    Marland Kitchen warfarin (COUMADIN) 1 MG tablet TAKE 3 TABLETS DAILY, OR AS DIRECTED. (Patient taking differently: Take 3 mg by mouth at bedtime. ) 100 tablet 2   No facility-administered medications prior to visit.   Allergies: Amiodarone and Penicillins  Social History        Socioeconomic History  . Marital status: Married    Spouse name: Not on file  . Number of children: Not on file  . Years of education: Not on file  . Highest education level: Not on file  Occupational History  . Not on file  Tobacco Use  . Smoking status: Never Smoker  . Smokeless tobacco: Never Used  Substance and Sexual Activity  . Alcohol use: Yes    Alcohol/week: 0.0 standard drinks    Comment: 3 beers 3 nights a week  . Drug use: No  . Sexual activity: Never  Other Topics Concern  . Not on file  Social History Narrative  . Not on file   Social Determinants of Health      Financial Resource Strain:   . Difficulty of Paying Living Expenses:   Food Insecurity:   . Worried About Charity fundraiser in the Last Year:   . Arboriculturist in the Last Year:   Transportation Needs:   . Film/video editor (Medical):   Marland Kitchen Lack of Transportation (Non-Medical):   Physical Activity:   . Days of Exercise per Week:   . Minutes of Exercise per Session:   Stress:   . Feeling of Stress :   Social Connections:   . Frequency of Communication with Friends and Family:   . Frequency of Social Gatherings with  Friends and Family:   . Attends Religious Services:   . Active Member of Clubs or Organizations:   . Attends Archivist Meetings:   Marland Kitchen Marital Status:    Family History: The patient's Family history is unknown by patient.  Review of Systems:  Please see the history of present illness.  General: No chills, fever, night sweats or weight changes.  Cardiovascular: No chest pain, edema, orthopnea, paroxysmal nocturnal dyspnea. Positive for palpitations and dyspnea.  Dermatological: No rash, lesions/masses  Respiratory: No cough.  Urologic: No hematuria, dysuria  Abdominal: No nausea, vomiting, diarrhea, bright  red blood per rectum, melena, or hematemesis  Neurologic: No visual changes, wkns, changes in mental status.  All other systems reviewed and are otherwise negative except as noted above.  Physical Exam:   VS: BP 130/70 (BP Location: Left Arm)  Pulse 95  Temp 97.6 F (36.4 C)  Ht _0  (1.626 m)  Wt 112 lb (50.8 kg)  LMP 07/16/2013  SpO2 94%  BMI 19.22 kg/m  General: Well developed, well nourished,female appearing in no acute distress.  Head: Normocephalic, atraumatic, sclera non-icteric.  Neck: No carotid bruits. JVD not elevated.  Lungs: Respirations regular and unlabored, without wheezes or rales.  Heart: Regular rhythm, tachycardiac rate. No S3 or S4. No rubs or gallops appreciated. Crisp mechanical valve sounds. 2/6 SEM along RUSB.  Abdomen: Soft, non-tender, non-distended. No obvious abdominal masses.  Msk: Strength and tone appear normal for age. No obvious joint deformities or effusions.  Extremities: No clubbing or cyanosis. No edema. Distal pedal pulses are 2+ bilaterally.  Neuro: Alert and oriented X 3. Moves all extremities spontaneously. No focal deficits noted.  Psych: Responds to questions appropriately with a normal affect.  Skin: No rashes or lesions noted     Wt Readings from Last 3 Encounters:  08/18/19 112 lb (50.8 kg)  07/12/19 107 lb (48.5 kg)    07/05/19 107 lb (48.5 kg)   Studies/Labs Reviewed:  EKG: EKG is ordered today. The ekg ordered today demonstrates what appears most consistent with 2:1 atrial flutter when compared to prior tracings and HR 103.  Recent Labs:  08/18/2019: BUN 13; Creatinine, Ser 0.87; Hemoglobin 13.3; Platelets 428; Potassium 3.8; Sodium 137  Lipid Panel  Labs (Brief)                                                    Additional studies/ records that were reviewed today include:  Echocardiogram: 07/2017  Study Conclusions   - Left ventricle: The cavity size was normal. Wall thickness was  increased in a pattern of mild LVH. Systolic function was normal.  The estimated ejection fraction was in the range of 60% to 65%.  - Aortic valve: Poorly visualized mechanical AVR with root  replacement Stable gradients and trivial AR DVI .53 suggesting  normal function. Valve area (VTI): 1.4 cm^2. Valve area (Vmax):  1.33 cm^2. Valve area (Vmean): 1.38 cm^2.  - Mitral valve: Appears to be some exuberant MAC Do not think this  represents vegetation&'s  - Atrial septum: No defect or patent foramen ovale was identified.  - Impressions: Suprasternal notch not well interrogated but CW  velocity close to 4 m/sec suggesting persistent coarctation   Impressions:   - Suprasternal notch not well interrogated but CW velocity close to  4 m/sec suggesting persistent coarctation  TEE: 07/12/2019  IMPRESSIONS    1. Left ventricular ejection fraction, by estimation, is 60 to 65%. The  left ventricle has normal function. The left ventricle has no regional  wall motion abnormalities.  2. Right ventricular systolic function is normal. The right ventricular  size is normal.  3. A left atrial/left atrial appendage thrombus was detected. There was  heavy spontaneous echo contrast swirling in the LAA with probable thrombus  in the apex with fibrin strands extending into the body of the appendage  4. The mitral valve is  normal in structure. Mild mitral valve  regurgitation. No evidence of mitral stenosis.  5. The aortic valve has been repaired/replaced. There is a normal  appearing mechanical valve present in the aortic position. Aortic valve  regurgitation is not visualized.  6. S/P aortic root replacement with no evidence of dilatation or  obstruction. Unable to visualize coarctation in descending aorta.  Assessment:    1. Atrial flutter, unspecified type ([place])   2. Medication management   3. S/P aortic valve replacement   4. Coarctation of aorta (preductal) (postductal)   5. Essential hypertension    Plan:  In order of problems listed above:  1. Atrial Flutter with known History of Paroxysmal Atrial Fibrillation  - she developed palpitations in late February and was found to be in atrial flutter at the time of her visit. TEE/DCCV was arranged but DCCV was cancelled given probable thrombus in the apex of the left atrial appendage. She has remained on Coumadin with therapeutic INR's since. Due for repeat today per Edrick Oh and will have checked. Risks and benefits of DCCV reviewed and she agrees to proceed. Prefers to have [facility] at [facility]. Will recheck INR today along pre-procedure CBC and BMET. EKG was obtained today as rhythm was regular by examination but EKG is still most consistent with atrial flutter.  - remains on Rythmol 234m TID and Toprol-XL 523mBID. Will eventually need EP follow-up to discuss antiarrhythmic options to see if she should remain on Rythmol or if alternatives should be considered. Previously intolerant to Amiodarone.  ADDENDUM: INR checked and resulted as subtherapeutic at 1.6. Coumadin dosing adjusted as outlined in result note and I will have LiEdrick Ohollow-up with the patient on Monday. I will also reach out to Dr. NiJohnsie Cancelo see if he wishes to change her procedure to TEE/DCCV given her subtherapeutic INR as she does not wish to wait an additional 4 weeks to have the  procedure performed.  2. Aortic Valve Replacement  - she is s/p mechanical AVR with root replacement in 07/2014. TEE last month showed normal valve function. Remains on Coumadin with goal INR 2.0 - 3.0. Will recheck INR today.  3. Coarctation of the Aorta  - s/p coarctation repair at age [age] not felt to require surgical intervention at the time of surgery in 2016. Echocardiogram in 2019 showed CW velocity close to 4 m/sec suggesting persistent coarctation. Continue to follow with routine echocardiogram imaging.  4. HTN  - BP is well-controlled at 130/70 during today's visit. Continue Toprol-XL 5020mID.  Medication Adjustments/Labs and Tests Ordered:  Current medicines are reviewed at length with the patient today. Concerns regarding medicines are outlined above. Medication changes, Labs and Tests ordered today are listed in the Patient Instructions below.  Patient Instructions  Medication Instructions:  Your physician recommends that you continue on your current medications as directed. Please refer to the Current Medication list given to you today.  *If you need a refill on your cardiac medications before your next appointment, please call your pharmacy*  Lab Work:  Your physician recommends that you continue on your current medications as directed. Please refer to the Current Medication list given to you today..  If you have labs (blood work) drawn today and your tests are completely normal, you will receive your results only by:  [facility] you have MyChart) OR  A paper copy in the mail If you have any lab test that is abnormal or we need to change your treatment, we will call you to review  the results.  Testing/Procedures:  Your physician has recommended that you have a Cardioversion (DCCV). Electrical Cardioversion uses a jolt of electricity to your heart either through paddles or wired patches attached to your chest. This is a controlled, usually prescheduled, procedure.  Defibrillation is done under light anesthesia in the hospital, and you usually go home the day of the procedure. This is done to get your heart back into a normal rhythm. You are not awake for the procedure. Please see the instruction sheet given to you today.  Follow-Up:  At [facility], you and your health needs are our priority. As part of our continuing mission to provide you with exceptional heart care, we have created designated Provider Care Teams. These Care Teams include your primary Cardiologist (physician) and Advanced Practice Providers (APPs - Physician Assistants and Nurse Practitioners) who all work together to provide you with the care you need, when you need it.  We recommend signing up for the patient portal called "MyChart". Sign up information is provided on this After Visit Summary. MyChart is used to connect with patients for Virtual Visits (Telemedicine). Patients are able to view lab/test results, encounter notes, upcoming appointments, etc. Non-urgent messages can be sent to your provider as well.  To learn more about what you can do with MyChart, go to [email or web address].  Your next appointment:  5 week(s)  The format for your next appointment:  In Person  Provider:  You may see Jenkins Rouge, MD or one of the following Advanced Practice Providers on your designated Care Team:  Bernerd Pho, PA-C  Ermalinda Barrios, PA-C  Other Instructions  Thank you for choosing [facility]!   Signed,  Erma Heritage, PA-C  08/19/2019 9:36 AM  [place] [address] [place], [place] [address]  Phone: [phone]  Fax: [phone]

## 2019-08-29 NOTE — Transfer of Care (Signed)
Immediate Anesthesia Transfer of Care Note  Patient: Mallory Sutton  Procedure(s) Performed: CARDIOVERSION (N/A ) TRANSESOPHAGEAL ECHOCARDIOGRAM (TEE) WITH PROPOFOL (N/A )  Patient Location: PACU  Anesthesia Type:MAC  Level of Consciousness: awake and patient cooperative  Airway & Oxygen Therapy: Patient Spontanous Breathing and Patient connected to nasal cannula oxygen  Post-op Assessment: Report given to RN and Post -op Vital signs reviewed and stable  Post vital signs: Reviewed and stable  Last Vitals:  Vitals Value Taken Time  BP    Temp    Pulse    Resp    SpO2      Last Pain:  Vitals:   08/29/19 0917  TempSrc: Oral  PainSc: 0-No pain      Patients Stated Pain Goal: 4 (51/88/41 6606)  Complications: No apparent anesthesia complications

## 2019-08-29 NOTE — Anesthesia Preprocedure Evaluation (Addendum)
Anesthesia Evaluation  Patient identified by MRN, date of birth, ID band Patient awake    Reviewed: Allergy & Precautions, NPO status , Patient's Chart, lab work & pertinent test results  History of Anesthesia Complications Negative for: history of anesthetic complications  Airway Mallampati: II  TM Distance: >3 FB Neck ROM: Full    Dental  (+) Dental Advisory Given, Missing   Pulmonary shortness of breath and with exertion,    Pulmonary exam normal breath sounds clear to auscultation       Cardiovascular +CHF and + DOE  + Valvular Problems/Murmurs (AVR)  Rhythm:Regular Rate:Tachycardia + Systolic murmurs [id number] 09:04:21 [facility]-300 ROUTINE RECORD Atrial flutter with variable A-V block Incomplete right bundle branch block Left anterior fascicular block Minimal voltage criteria for LVH, may be normal variant ( Cornell product ) Cannot rule out Inferior infarct , age undetermined Abnormal ECG  Echo IMPRESSIONS    1. Left ventricular ejection fraction, by estimation, is 60 to 65%. The  left ventricle has normal function. The left ventricle has no regional  wall motion abnormalities.  2. Right ventricular systolic function is normal. The right ventricular size is normal.  3. A left atrial/left atrial appendage thrombus was detected. There was heavy spontaneous echo contrast swirling in the LAA with probable thrombus  in the apex with fibrin strands extending into the body of the appendage  4. The mitral valve is normal in structure. Mild mitral valve  regurgitation. No evidence of mitral stenosis.  5. The aortic valve has been repaired/replaced. There is a normal appearing mechanical valve present in the aortic position. Aortic valve regurgitation is not visualized.  6. S/P aortic root replacement with no evidence of dilatation or obstruction. Unable to visualize coarctation in descending aorta.       Neuro/Psych negative neurological ROS  negative psych ROS   GI/Hepatic negative GI ROS, Neg liver ROS,   Endo/Other  negative endocrine ROS  Renal/GU negative Renal ROS     Musculoskeletal negative musculoskeletal ROS (+)   Abdominal   Peds  Hematology negative hematology ROS (+)   Anesthesia Other Findings   Reproductive/Obstetrics negative OB ROS                            Anesthesia Physical Anesthesia Plan  ASA: III  Anesthesia Plan: General   Post-op Pain Management:    Induction: Intravenous  PONV Risk Score and Plan: 2 and Ondansetron  Airway Management Planned: Nasal Cannula, Natural Airway and Simple Face Mask  Additional Equipment:   Intra-op Plan:   Post-operative Plan:   Informed Consent: I have reviewed the patients History and Physical, chart, labs and discussed the procedure including the risks, benefits and alternatives for the proposed anesthesia with the patient or authorized representative who has indicated his/her understanding and acceptance.     Dental advisory given  Plan Discussed with: CRNA and Surgeon  Anesthesia Plan Comments:         Anesthesia Quick Evaluation

## 2019-08-29 NOTE — Discharge Instructions (Addendum)
Transesophageal Echocardiogram  Transesophageal echocardiogram (TEE) is a test that uses sound waves (echocardiogram) to produce very clear, detailed images of the heart. TEE is done by passing a flexible tube down the esophagus. The heart is located in front of the esophagus. TEE may be done:  To check how well your heart valves are working.  To check for any abnormal growth or tumor  To look for blood clots  To check for infection of the lining of the heart (endocarditis).  To evaluate the dividing wall (septum) of the heart and check for a hole that did not close after birth (patent foramen ovale or atrial septal defect).  To help diagnose a tear in the wall of the blood vessels (aortic dissection).  To look at the heart shape, size, and function. Any changes can be associated with certain conditions, including heart failure, aneurysm, and coronary heart disease, CAD.  During cardiac valve surgery. This allows the surgeon to assess the valve repair before closing the chest.  During a variety of other cardiac procedures to guide positioning of catheters.  To monitor your heart's response to IV fluids or medicine. TEE is usually not a painful procedure. You may feel the probe press against the back of the throat. The probe does not enter the trachea and does not affect your breathing. Tell a health care provider about:  Any allergies you have.  All medicines you are taking, including vitamins, herbs, eye drops, creams, and over-the-counter medicines.  Any problems you or family members have had with anesthetic medicines.  Any blood disorders you have.  Any surgeries you have had.  Any medical conditions you have.  Any swallowing difficulties.  Whether you have or have had a blockage of the esophagus (esophageal obstruction).  Whether you are pregnant or may be pregnant. What are the risks? Generally, this is a safe procedure. However, problems may occur,  including:  Damage to other structures or organs.  A tear of the esophagus (esophageal rupture).  Irregular heart beat (arrhythmia).  Hoarse voice or difficulty swallowing.  Bleeding (hemorrhage). What happens before the procedure? Staying hydrated Follow instructions from your health care provider about hydration, which may include:  Up to 3 hours before the procedure - you may continue to drink clear liquids, such as water, clear fruit juice, black coffee, and plain tea. Eating and drinking Follow instructions from your health care provider about eating and drinking, which may include:  8 hours before the procedure - stop eating heavy meals or foods such as meat, fried foods, or fatty foods.  6 hours before the procedure - stop eating light meals or foods, such as toast or cereal.  6 hours before the procedure - stop drinking milk or drinks that contain milk.  3 hours before the procedure - stop drinking clear liquids. General instructions  You will need to remove any dentures or dental retainers.  Plan to have someone take you home from the hospital or clinic.  Plan to have a responsible adult care for you for at least 24 hours after you leave the hospital or clinic. This is important.  Ask your health care provider about: ? Changing or stopping your normal medicines. This is important if you take diabetes medicines or blood thinners. ? Taking over-the-counter medicines, vitamins, herbs, and supplements. ? Taking medicines such as aspirin and ibuprofen. These medicines can thin your blood. Do not take these medicines unless your health care provider tells you to take them. What happens during  the procedure?  To reduce your risk of infection: ? Your health care team will wash or sanitize their hands. ? Hair may be removed from the surgical area. ? Your skin will be washed with soap.  An IV will be inserted into one of your veins.  You will be given one or both of the  following: ? A medicine to help you relax (sedative). ? A medicine to be sprayed or gargled in order to numb the back of your throat (local anesthetic).  Your blood pressure, heart rate, and breathing (vital signs) will be monitored during the procedure.  You may be asked to lay on your left side.  A bite block will be placed in your mouth to keep you from biting the tube during the procedure.  The tip of the TEE probe will be placed into the back of your mouth. You will be asked to swallow. This helps to pass the tip of the probe into the esophagus.  Once the tip of the probe is in the correct area, your health care provider will take pictures of the heart.  The probe and bite block will be removed when the procedure is done. The procedure may vary among health care providers and hospitals What happens after the procedure?  Your blood pressure, heart rate, breathing rate, and blood oxygen level will be monitored until the medicines you were given have worn off.  When you first wake up, your throat may feel slightly sore and will probably still feel numb. This will improve slowly over time. You will not be allowed to eat or drink until the numbness has gone away.  Do not drive for 24 hours if you received a sedative. Summary  Transesophageal echocardiogram (TEE) is a test that uses sound waves (echocardiogram) to produce very clear, detailed images of the heart.  TEE is done by passing a flexible tube down the esophagus.  Generally, this is a safe procedure. However, problems may occur, including damage to other structures or organs, bleeding, irregular heart beat, and a hoarse voice or trouble swallowing.  Tell your health care provider about any medicines and medical conditions you may have, as some conditions may increase your risk of complications. This information is not intended to replace advice given to you by your health care provider. Make sure you discuss any questions you  have with your health care provider. Document Revised: 09/29/2016 Document Reviewed: 07/17/2016 Elsevier Patient Education  [place] POST-ANESTHESIA  IMMEDIATELY FOLLOWING SURGERY:  Do not drive or operate machinery for the first twenty four hours after surgery.  Do not make any important decisions for twenty four hours after surgery or while taking narcotic pain medications or sedatives.  If you develop intractable nausea and vomiting or a severe headache please notify your doctor immediately.  FOLLOW-UP:  Please make an appointment with your surgeon as instructed. You do not need to follow up with anesthesia unless specifically instructed to do so.  WOUND CARE INSTRUCTIONS (if applicable):  Keep a dry clean dressing on the anesthesia/puncture wound site if there is drainage.  Once the wound has quit draining you may leave it open to air.  Generally you should leave the bandage intact for twenty four hours unless there is drainage.  If the epidural site drains for more than 36-48 hours please call the anesthesia department.  QUESTIONS?:  Please feel free to call your physician or the hospital operator if you have any questions, and they will  be happy to assist you.

## 2019-08-29 NOTE — CV Procedure (Signed)
CV procedure note  Procedure TEE DCCV Physician: Dr [name] Indication: atrial flutter    Patient was brought to the procedure suite after appropriate consent was obtained. Sedation was achieved with the assistance of anesthesiology, please see there note for details. Bite block was placed and she was placed in the lateral decubitus position. TEE probe was intubated into esophagus without difficultly and several images were obtained. There was trivial left atrial appendage spoke without clear thrombus.   We then proceeded to the cardioversion portion of the procedure. Defib pads were placed in the anterior and posterior postions, she was converted with a single synchronized 200j shock from aflutter to sinus brady at 50. Some hypotension post conversion, was given 37mcg of neosynephrine.   [name] MD

## 2019-08-29 NOTE — Anesthesia Postprocedure Evaluation (Signed)
Anesthesia Post Note  Patient: Mallory Sutton  Procedure(s) Performed: CARDIOVERSION (N/A ) TRANSESOPHAGEAL ECHOCARDIOGRAM (TEE) WITH PROPOFOL (N/A )  Patient location during evaluation: PACU Anesthesia Type: General Level of consciousness: awake and alert and oriented Pain management: pain level controlled Vital Signs Assessment: post-procedure vital signs reviewed and stable Respiratory status: spontaneous breathing and respiratory function stable Cardiovascular status: blood pressure returned to baseline Postop Assessment: no apparent nausea or vomiting Anesthetic complications: no     Last Vitals:  Vitals:   08/29/19 1130 08/29/19 1145  BP: (!) 104/55 124/68  Pulse: 73 73  Resp: 15 16  Temp:    SpO2: 97% 100%    Last Pain:  Vitals:   08/29/19 1145  TempSrc:   PainSc: 0-No pain                 [name] C [name]

## 2019-09-01 ENCOUNTER — Telehealth: Payer: Self-pay | Admitting: *Deleted

## 2019-09-01 ENCOUNTER — Encounter: Payer: Self-pay | Admitting: *Deleted

## 2019-09-01 NOTE — Telephone Encounter (Signed)
-----   Message from Arnoldo Lenis, MD sent at 08/30/2019  9:50 AM EDT ----- Potassium was a little low from her labs, would start KCl 10mEq daily  Zandra Abts MD

## 2019-09-01 NOTE — Telephone Encounter (Signed)
LM to return call.

## 2019-09-05 MED ORDER — POTASSIUM CHLORIDE CRYS ER 20 MEQ PO TBCR
20.0000 meq | EXTENDED_RELEASE_TABLET | Freq: Every day | ORAL | 1 refills | Status: DC
Start: 2019-09-05 — End: 2020-12-03

## 2019-09-05 NOTE — Telephone Encounter (Signed)
Pt voiced understanding - Medication sent to pharmacy - routed to pcp

## 2019-09-12 ENCOUNTER — Ambulatory Visit (INDEPENDENT_AMBULATORY_CARE_PROVIDER_SITE_OTHER): Payer: Self-pay | Admitting: *Deleted

## 2019-09-12 ENCOUNTER — Other Ambulatory Visit: Payer: Self-pay

## 2019-09-12 DIAGNOSIS — Z5181 Encounter for therapeutic drug level monitoring: Secondary | ICD-10-CM

## 2019-09-12 DIAGNOSIS — I480 Paroxysmal atrial fibrillation: Secondary | ICD-10-CM

## 2019-09-12 LAB — POCT INR: INR: 6.4 — AB (ref 2.0–3.0)

## 2019-09-12 NOTE — Patient Instructions (Signed)
Hold warfarin tonight and tomorrow night then decrease dose to 3 tablets daily except 2 tablets on Wednesdays Recheck in 1 week Bleeding and fall precautions discussed with pt and she verbalized understanding

## 2019-09-13 ENCOUNTER — Encounter: Payer: Self-pay | Admitting: Internal Medicine

## 2019-09-13 ENCOUNTER — Ambulatory Visit (INDEPENDENT_AMBULATORY_CARE_PROVIDER_SITE_OTHER): Payer: Self-pay | Admitting: Internal Medicine

## 2019-09-13 VITALS — BP 140/74 | HR 72 | Ht 64.0 in | Wt 113.4 lb

## 2019-09-13 DIAGNOSIS — I4892 Unspecified atrial flutter: Secondary | ICD-10-CM

## 2019-09-13 DIAGNOSIS — I480 Paroxysmal atrial fibrillation: Secondary | ICD-10-CM

## 2019-09-13 MED ORDER — METOPROLOL SUCCINATE ER 25 MG PO TB24
25.0000 mg | ORAL_TABLET | Freq: Every day | ORAL | 3 refills | Status: DC
Start: 2019-09-13 — End: 2020-10-29

## 2019-09-13 MED ORDER — PROPAFENONE HCL ER 325 MG PO CP12: 325.0000 mg | ORAL_CAPSULE | Freq: Three times a day (TID) | ORAL | 3 refills | Status: DC

## 2019-09-13 NOTE — Patient Instructions (Addendum)
Medication Instructions:  Your physician has recommended you make the following change in your medication:  1.  Increase your propafenone--Take 325 mg tablet- one tablet by mouth THREE times a day  2.  The day after you increase your propafenone-DECREASE your metoprolol succinate--Take 25 mg-one tablet by mouth twice a day  Labwork: None ordered.  Testing/Procedures:  You will need an EKG in [place] 5 days after increasing your propafenone.  Follow-Up: Your physician wants you to follow-up in: late June 2021 with Dr. Lovena Le in [place].  Any Other Special Instructions Will Be Listed Below (If Applicable).  If you need a refill on your cardiac medications before your next appointment, please call your pharmacy.

## 2019-09-13 NOTE — Progress Notes (Signed)
HPI Mallory Sutton returns after a long absence from our EP clinic. I saw her last in 2015. She has a h/o aortic valve disease (bicuspid AV and AS) and a repaired coarc and a left sided SVT who has had PAF which has increased in frequency and severity over the past few months. She has not had syncope. She has had 2 episodes. She remains on systemic anti-coagulation. She has been on propafenone 225 tid for many years. Allergies  Allergen Reactions  . Amiodarone Nausea Only  . Penicillins Hives    Has patient had a PCN reaction causing immediate rash, facial/tongue/throat swelling, SOB or lightheadedness with hypotension: YES Has patient had a PCN reaction causing severe rash involving mucus membranes or skin necrosis: NO Has patient had a PCN reaction that required hospitalization NO Has patient had a PCN reaction occurring within the last 10 years: NO If all of the above answers are "NO", then may proceed with Cephalosporin use.      Current Outpatient Medications  Medication Sig Dispense Refill  . aspirin EC 81 MG tablet Take 81 mg by mouth daily.    . diazepam (VALIUM) 5 MG tablet Take 1 tablet (5 mg total) by mouth daily as needed for anxiety. (Patient taking differently: Take 5 mg by mouth at bedtime. ) 30 tablet 0  . furosemide (LASIX) 40 MG tablet Take 40 mg by mouth daily.    Marland Kitchen gabapentin (NEURONTIN) 300 MG capsule Take 300 mg by mouth See admin instructions. Take 300 mg in the morning, 300 mg in the afternoon   600 mg at bedtime    . metoprolol succinate (TOPROL-XL) 50 MG 24 hr tablet Take 1 tablet (50 mg total) by mouth in the morning and at bedtime. Take with or immediately following a meal. 60 tablet 11  . Multiple Vitamin (MULTI-VITAMINS) TABS Take 1 tablet by mouth daily.     . potassium chloride SA (KLOR-CON) 20 MEQ tablet Take 1 tablet (20 mEq total) by mouth daily. 90 tablet 1  . propafenone (RYTHMOL) 225 MG tablet TAKE (1) TABLET BY MOUTH (3) TIMES DAILY. (Patient taking  differently: Take 225 mg by mouth 3 (three) times daily. ) 90 tablet 6  . traMADol (ULTRAM) 50 MG tablet Take 1 tablet (50 mg total) by mouth 3 (three) times daily. (Patient taking differently: Take 25 mg by mouth 3 (three) times daily as needed for moderate pain. ) 90 tablet 3  . triamcinolone cream (KENALOG) 0.1 % Apply 1 application topically daily as needed (Vasculitis).     . Turmeric 500 MG CAPS Take 500 mg by mouth 3 (three) times a week.    . warfarin (COUMADIN) 1 MG tablet TAKE 3 TABLETS DAILY, OR AS DIRECTED. (Patient taking differently: Take 3 mg by mouth at bedtime. ) 100 tablet 2   No current facility-administered medications for this visit.     Past Medical History:  Diagnosis Date  . Aortic stenosis   . ATRIAL ARRHYTHMIAS   . Bicuspid aortic valve   . CHF (congestive heart failure) ([place])   . COARCTATION OF AORTA   . ENDOMETRIOSIS   . Heart murmur   . Paroxysmal atrial fibrillation (H[facility])   . Shortness of breath   . THORACIC AORTIC ANEURYSM     ROS:   All systems reviewed and negative except as noted in the HPI.   Past Surgical History:  Procedure Laterality Date  . CARDIOVERSION N/A 08/29/2019   Procedure: CARDIOVERSION;  Surgeon:  Arnoldo Lenis, MD;  Location: [facility];  Service: Endoscopy;  Laterality: N/A;  . coarctation repair and residual restenosis    . KNEE SURGERY    . TEE WITHOUT CARDIOVERSION N/A 07/12/2019   Procedure: TRANSESOPHAGEAL ECHOCARDIOGRAM (TEE);  Surgeon: Sueanne Margarita, MD;  Location: [facility] ENDOSCOPY;  Service: Cardiovascular;  Laterality: N/A;  . TEE WITHOUT CARDIOVERSION N/A 08/29/2019   Procedure: TRANSESOPHAGEAL ECHOCARDIOGRAM (TEE) WITH PROPOFOL;  Surgeon: Arnoldo Lenis, MD;  Location: [facility];  Service: Endoscopy;  Laterality: N/A;     Family History  Family history unknown: Yes     Social History   Socioeconomic History  . Marital status: Married    Spouse name: Not on file  . Number of children: Not on file  . Years of  education: Not on file  . Highest education level: Not on file  Occupational History  . Not on file  Tobacco Use  . Smoking status: Never Smoker  . Smokeless tobacco: Never Used  Substance and Sexual Activity  . Alcohol use: Yes    Alcohol/week: 0.0 standard drinks    Comment: 3 beers 3 nights a week  . Drug use: No  . Sexual activity: Never  Other Topics Concern  . Not on file  Social History Narrative  . Not on file   Social Determinants of Health   Financial Resource Strain:   . Difficulty of Paying Living Expenses:   Food Insecurity:   . Worried About Charity fundraiser in the Last Year:   . Arboriculturist in the Last Year:   Transportation Needs:   . Film/video editor (Medical):   Marland Kitchen Lack of Transportation (Non-Medical):   Physical Activity:   . Days of Exercise per Week:   . Minutes of Exercise per Session:   Stress:   . Feeling of Stress :   Social Connections:   . Frequency of Communication with Friends and Family:   . Frequency of Social Gatherings with Friends and Family:   . Attends Religious Services:   . Active Member of Clubs or Organizations:   . Attends Archivist Meetings:   Marland Kitchen Marital Status:   Intimate Partner Violence:   . Fear of Current or Ex-Partner:   . Emotionally Abused:   Marland Kitchen Physically Abused:   . Sexually Abused:      BP 140/74   Pulse 72   Ht 5\' 4"  (1.626 m)   Wt 113 lb 6.4 oz (51.4 kg)   LMP 07/16/2013   SpO2 93%   BMI 19.47 kg/m   Physical Exam:  Well appearing NAD HEENT: Unremarkable Neck:  No JVD, no thyromegally Lymphatics:  No adenopathy Back:  No CVA tenderness Lungs:  Clear with no wheezes HEART:  Regular rate rhythm, no murmurs, no rubs, no clicks Abd:  soft, positive bowel sounds, no organomegally, no rebound, no guarding Ext:  2 plus pulses, no edema, no cyanosis, no clubbing Skin:  No rashes no nodules Neuro:  CN II through XII intact, motor grossly intact  EKG - nsr   Assess/Plan: 1. PAF  - we discussed the treatment options with the patient and I have recommended we uptitrate her propafenone to 325 tid, and reduce her dose of toprol. 2. Aortic valve disease - she is s/p valve replacement. No evidence of residual stenosis or regurge by echo. 3. Coarct - she has a residual loud murmur. Apparently it was not thought to be in need of repair at the  time of valve replacement.  Mikle Bosworth.D

## 2019-09-14 ENCOUNTER — Telehealth: Payer: Self-pay

## 2019-09-14 NOTE — Telephone Encounter (Signed)
**Note De-Identified Redmond Whittley Obfuscation** Message received from [place] in our refills dept. Stating that the pt called and stated that she cannot afford her increased dose of Propafenone 325 mg TID. She was currently on 225 mg TID which was $76 to now $411 (I think this is for 30 day supply).  I have checked Good[facility] and the prices are better than what the pt states she is being charged.  I have left a VM asking the pt to call me back so we can discuss the cost of her Propafenone.

## 2019-09-15 NOTE — Telephone Encounter (Signed)
Propafenone SR is actually Rythmol SR and is generic but it does tend to be more expensive.  I do see on Good [facility] that 90 capsules of the 325mg  is 74.30 at [facility] but unfortunately they do not make manufacturers coupons for this product

## 2019-09-15 NOTE — Telephone Encounter (Addendum)
**Note De-Identified Mallory Sutton Obfuscation** I called the pt to discuss sending her Propafenone RX to [facility] for the reduced Good [facility] cost but she states they only have one car so her getting to [place] monthly for refill is difficult for her as she lives in [place].  She states that she has plenty of Propafenone 225 mg on hand but it is not the extended release and is going to continue to take 225 mg TID until she hears back from [place].   She states that the Propafenone that she has on hand are scored and she is willing to cut into and take any combination needed to continue with regular Propafenone (not extended release form). She reports that she just cannot afford thePropafenone extended release tablet as she has researched it.and feels that is why her cost went up so much.  She wants to know if there is anyway for her to continue to take the regular Propafenone even if she needs to break tablets in half to equal a safe dose?  She is aware that I am sending this message to Dr Lovena Le and his nurse for advisement.

## 2019-09-15 NOTE — Telephone Encounter (Signed)
Patient returning call.

## 2019-09-15 NOTE — Telephone Encounter (Signed)
[facility] at [facility] is $52/month - sometimes cheaper to do a 39month supply through goodrx.

## 2019-09-18 ENCOUNTER — Ambulatory Visit: Payer: Self-pay

## 2019-09-18 NOTE — Telephone Encounter (Signed)
I would say just continue the 225 tid until her new script comes in. GT

## 2019-09-19 ENCOUNTER — Ambulatory Visit (INDEPENDENT_AMBULATORY_CARE_PROVIDER_SITE_OTHER): Payer: Self-pay | Admitting: *Deleted

## 2019-09-19 ENCOUNTER — Other Ambulatory Visit: Payer: Self-pay

## 2019-09-19 ENCOUNTER — Ambulatory Visit: Payer: Self-pay | Admitting: *Deleted

## 2019-09-19 DIAGNOSIS — Z5181 Encounter for therapeutic drug level monitoring: Secondary | ICD-10-CM

## 2019-09-19 DIAGNOSIS — I480 Paroxysmal atrial fibrillation: Secondary | ICD-10-CM

## 2019-09-19 LAB — POCT INR: INR: 2.8 (ref 2.0–3.0)

## 2019-09-19 NOTE — Patient Instructions (Signed)
2nd post DCCV Continue warfarin 3 tablets daily except 2 tablets on Thursdays Recheck in 1 week

## 2019-09-19 NOTE — Progress Notes (Signed)
Appointment canceled per patient request-says she did not increase her medication

## 2019-09-25 ENCOUNTER — Other Ambulatory Visit: Payer: Self-pay | Admitting: Cardiovascular Disease

## 2019-09-25 NOTE — Telephone Encounter (Signed)
Call placed to Pt  Advised to continue propafenone 225 tid until can discuss with Dr. Lovena Le  Propafenone 300 mg is the not long acting strength that he may want to prescribe  Advised dr. Lovena Le would be back next week and will follow up.  Pt thanked nurse for call.

## 2019-09-25 NOTE — Addendum Note (Signed)
Addended by: Willeen Cass A on: 09/25/2019 08:09 AM   Modules accepted: Orders

## 2019-09-26 ENCOUNTER — Ambulatory Visit (INDEPENDENT_AMBULATORY_CARE_PROVIDER_SITE_OTHER): Payer: Self-pay | Admitting: *Deleted

## 2019-09-26 ENCOUNTER — Other Ambulatory Visit: Payer: Self-pay

## 2019-09-26 DIAGNOSIS — Z5181 Encounter for therapeutic drug level monitoring: Secondary | ICD-10-CM

## 2019-09-26 DIAGNOSIS — I480 Paroxysmal atrial fibrillation: Secondary | ICD-10-CM

## 2019-09-26 LAB — POCT INR: INR: 6.2 — AB (ref 2.0–3.0)

## 2019-09-26 NOTE — Patient Instructions (Signed)
3rd post DCCV Hold warfarin today and tomorrow, take 1 tablet on Thursdays then resume 3 tablets daily except 2 tablets on Thursdays Recheck in 1 week Bleeding and fall precautions discussed with pt and she verbalized understanding

## 2019-09-29 MED ORDER — PROPAFENONE HCL 300 MG PO TABS: 300.0000 mg | ORAL_TABLET | Freq: Three times a day (TID) | ORAL | 3 refills | Status: DC

## 2019-09-29 NOTE — Addendum Note (Signed)
Addended by: Willeen Cass A on: 09/29/2019 12:07 PM   Modules accepted: Orders

## 2019-09-29 NOTE — Telephone Encounter (Signed)
Spoke with Dr. Lovena Le  Per Dr. Corliss Parish to order propafenone 300 mg TID.  Advised Pt.    Will send in new prescription.  Per Dr. Sherron Ales Pt come to [facility] for EKG 1-2 weeks after starting new prescription.  Will forward to [place] office.  Please call Pt next Tuesday June 2 to see if she was able to get prescription and schedule for nurse visit.

## 2019-10-03 ENCOUNTER — Ambulatory Visit (INDEPENDENT_AMBULATORY_CARE_PROVIDER_SITE_OTHER): Payer: Self-pay | Admitting: *Deleted

## 2019-10-03 ENCOUNTER — Other Ambulatory Visit: Payer: Self-pay

## 2019-10-03 DIAGNOSIS — Z5181 Encounter for therapeutic drug level monitoring: Secondary | ICD-10-CM

## 2019-10-03 DIAGNOSIS — I480 Paroxysmal atrial fibrillation: Secondary | ICD-10-CM

## 2019-10-03 LAB — POCT INR: INR: 3.4 — AB (ref 2.0–3.0)

## 2019-10-03 NOTE — Patient Instructions (Signed)
4th post DCCV Hold warfarin today then decrease dose to resume 3 tablets daily except 2 tablets on Mondays and Thursdays Recheck in 2 week

## 2019-10-04 NOTE — Telephone Encounter (Signed)
Called pt to schedule nurse visit. No answer. Lmtcb

## 2019-10-04 NOTE — Telephone Encounter (Signed)
Pt stated she was able to start rythmol today. Nurse visit appt scheduled for 6/15.

## 2019-10-16 ENCOUNTER — Telehealth: Payer: Self-pay

## 2019-10-16 NOTE — Telephone Encounter (Signed)
Patient OOT, r/s to 10/25/19 at 4:15 pm. She increased propafenone on 10/11/19

## 2019-10-16 NOTE — Telephone Encounter (Signed)
Pt did not take medication as instructed and wants to know if she should keep Nurse Visit appt on 10-17-19.  Please call [phone]  Thanks renee

## 2019-10-17 ENCOUNTER — Ambulatory Visit: Payer: Self-pay

## 2019-10-25 ENCOUNTER — Ambulatory Visit (INDEPENDENT_AMBULATORY_CARE_PROVIDER_SITE_OTHER): Payer: Self-pay | Admitting: *Deleted

## 2019-10-25 ENCOUNTER — Other Ambulatory Visit: Payer: Self-pay

## 2019-10-25 DIAGNOSIS — I480 Paroxysmal atrial fibrillation: Secondary | ICD-10-CM

## 2019-10-25 NOTE — Progress Notes (Signed)
Pt in office for EKG

## 2019-11-01 ENCOUNTER — Ambulatory Visit: Payer: Self-pay | Admitting: Internal Medicine

## 2019-11-10 ENCOUNTER — Ambulatory Visit (INDEPENDENT_AMBULATORY_CARE_PROVIDER_SITE_OTHER): Payer: Self-pay | Admitting: *Deleted

## 2019-11-10 ENCOUNTER — Other Ambulatory Visit: Payer: Self-pay

## 2019-11-10 DIAGNOSIS — Z5181 Encounter for therapeutic drug level monitoring: Secondary | ICD-10-CM

## 2019-11-10 DIAGNOSIS — I480 Paroxysmal atrial fibrillation: Secondary | ICD-10-CM

## 2019-11-10 LAB — POCT INR: INR: 4.3 — AB (ref 2.0–3.0)

## 2019-11-10 NOTE — Patient Instructions (Signed)
Hold warfarin tonight then decrease dose to 3 tablets daily except 2 tablets on Tuesdays, Thursdays and Saturdays Recheck in 2 week

## 2019-11-27 ENCOUNTER — Other Ambulatory Visit: Payer: Self-pay

## 2019-11-27 ENCOUNTER — Ambulatory Visit (INDEPENDENT_AMBULATORY_CARE_PROVIDER_SITE_OTHER): Payer: Self-pay | Admitting: *Deleted

## 2019-11-27 DIAGNOSIS — Z5181 Encounter for therapeutic drug level monitoring: Secondary | ICD-10-CM

## 2019-11-27 DIAGNOSIS — I480 Paroxysmal atrial fibrillation: Secondary | ICD-10-CM

## 2019-11-27 LAB — POCT INR: INR: 3.4 — AB (ref 2.0–3.0)

## 2019-11-27 NOTE — Patient Instructions (Addendum)
Hold warfarin tonight then decrease dose to 2 tablets daily except 3  tablets on Mondays, Wednesdays and Fridays Recheck in 3 weeks

## 2019-12-20 ENCOUNTER — Ambulatory Visit (INDEPENDENT_AMBULATORY_CARE_PROVIDER_SITE_OTHER): Payer: Self-pay | Admitting: *Deleted

## 2019-12-20 DIAGNOSIS — I480 Paroxysmal atrial fibrillation: Secondary | ICD-10-CM

## 2019-12-20 DIAGNOSIS — Z5181 Encounter for therapeutic drug level monitoring: Secondary | ICD-10-CM

## 2019-12-20 DIAGNOSIS — Q251 Coarctation of aorta: Secondary | ICD-10-CM

## 2019-12-20 DIAGNOSIS — Z952 Presence of prosthetic heart valve: Secondary | ICD-10-CM

## 2019-12-20 DIAGNOSIS — I4891 Unspecified atrial fibrillation: Secondary | ICD-10-CM

## 2019-12-20 DIAGNOSIS — Q231 Congenital insufficiency of aortic valve: Secondary | ICD-10-CM

## 2019-12-20 LAB — POCT INR: INR: 5.6 — AB (ref 2.0–3.0)

## 2019-12-20 NOTE — Patient Instructions (Signed)
Hold warfarin x 3 days then resume 2 tablets daily except 3  tablets on Mondays, Wednesdays and Fridays Recheck in 2 weeks

## 2020-01-01 ENCOUNTER — Ambulatory Visit (INDEPENDENT_AMBULATORY_CARE_PROVIDER_SITE_OTHER): Payer: Self-pay | Admitting: *Deleted

## 2020-01-01 DIAGNOSIS — Z5181 Encounter for therapeutic drug level monitoring: Secondary | ICD-10-CM

## 2020-01-01 DIAGNOSIS — I480 Paroxysmal atrial fibrillation: Secondary | ICD-10-CM

## 2020-01-01 LAB — POCT INR: INR: 3.8 — AB (ref 2.0–3.0)

## 2020-01-01 NOTE — Patient Instructions (Signed)
Hold warfarin tonight then decrease dose to 2 tablets daily except 3 tablets on Mondays Recheck in 3 weeks

## 2020-01-25 ENCOUNTER — Ambulatory Visit (INDEPENDENT_AMBULATORY_CARE_PROVIDER_SITE_OTHER): Payer: Self-pay | Admitting: *Deleted

## 2020-01-25 DIAGNOSIS — I480 Paroxysmal atrial fibrillation: Secondary | ICD-10-CM

## 2020-01-25 DIAGNOSIS — Z952 Presence of prosthetic heart valve: Secondary | ICD-10-CM

## 2020-01-25 DIAGNOSIS — Z5181 Encounter for therapeutic drug level monitoring: Secondary | ICD-10-CM

## 2020-01-25 LAB — POCT INR: INR: 4.6 — AB (ref 2.0–3.0)

## 2020-01-25 NOTE — Patient Instructions (Signed)
Hold warfarin tonight then decrease dose to 2 tablets daily except 1 tablets on Mondays Recheck in 2-3 weeks

## 2020-02-01 ENCOUNTER — Ambulatory Visit (INDEPENDENT_AMBULATORY_CARE_PROVIDER_SITE_OTHER): Payer: Self-pay | Admitting: Internal Medicine

## 2020-02-01 ENCOUNTER — Other Ambulatory Visit: Payer: Self-pay

## 2020-02-01 ENCOUNTER — Encounter: Payer: Self-pay | Admitting: Internal Medicine

## 2020-02-01 VITALS — BP 164/80 | HR 69 | Ht 64.0 in | Wt 106.6 lb

## 2020-02-01 DIAGNOSIS — I776 Arteritis, unspecified: Secondary | ICD-10-CM

## 2020-02-01 DIAGNOSIS — Z23 Encounter for immunization: Secondary | ICD-10-CM

## 2020-02-01 NOTE — Progress Notes (Signed)
HPI Mrs. Zapien returns today for followup. She is a pleasant 59 yo woman with a h/o Coarctation of the aorta, repaired at age 39, bicuspide aortic valve, and ascending aneurysm, s/p surgery. She has had PAF and been treated with propafenone. She was noted to have a reticular rash and was referred to derm which she did not show. She has severe leg pain. She has been on steroids but these were stopped. Her atrial fib and flutter have been quiet on rhythmol.  Allergies  Allergen Reactions  . Amiodarone Nausea Only  . Penicillins Hives    Has patient had a PCN reaction causing immediate rash, facial/tongue/throat swelling, SOB or lightheadedness with hypotension: YES Has patient had a PCN reaction causing severe rash involving mucus membranes or skin necrosis: NO Has patient had a PCN reaction that required hospitalization NO Has patient had a PCN reaction occurring within the last 10 years: NO If all of the above answers are "NO", then may proceed with Cephalosporin use.      Current Outpatient Medications  Medication Sig Dispense Refill  . aspirin EC 81 MG tablet Take 81 mg by mouth daily.    . diazepam (VALIUM) 5 MG tablet Take 1 tablet (5 mg total) by mouth daily as needed for anxiety. 30 tablet 0  . furosemide (LASIX) 40 MG tablet Take 40 mg by mouth daily.    Marland Kitchen gabapentin (NEURONTIN) 300 MG capsule Take 300 mg by mouth See admin instructions. Take 300 mg in the morning, 300 mg in the afternoon   600 mg at bedtime    . metoprolol succinate (TOPROL XL) 25 MG 24 hr tablet Take 1 tablet (25 mg total) by mouth daily. 180 tablet 3  . Multiple Vitamin (MULTI-VITAMINS) TABS Take 1 tablet by mouth daily.     . propafenone (RYTHMOL) 300 MG tablet Take 1 tablet (300 mg total) by mouth every 8 (eight) hours. 270 tablet 3  . traMADol (ULTRAM) 50 MG tablet Take 1 tablet (50 mg total) by mouth 3 (three) times daily. 90 tablet 3  . triamcinolone cream (KENALOG) 0.1 % Apply 1 application topically  daily as needed (Vasculitis).     . Turmeric 500 MG CAPS Take 500 mg by mouth 3 (three) times a week.    . warfarin (COUMADIN) 1 MG tablet TAKE 3 TABLETS DAILY, OR AS DIRECTED. 100 tablet 3  . potassium chloride SA (KLOR-CON) 20 MEQ tablet Take 1 tablet (20 mEq total) by mouth daily. 90 tablet 1   No current facility-administered medications for this visit.     Past Medical History:  Diagnosis Date  . Aortic stenosis   . ATRIAL ARRHYTHMIAS   . Bicuspid aortic valve   . CHF (congestive heart failure) ([place])   . COARCTATION OF AORTA   . ENDOMETRIOSIS   . Heart murmur   . Paroxysmal atrial fibrillation ([facility])   . Shortness of breath   . THORACIC AORTIC ANEURYSM     ROS:   All systems reviewed and negative except as noted in the HPI.   Past Surgical History:  Procedure Laterality Date  . CARDIOVERSION N/A 08/29/2019   Procedure: CARDIOVERSION;  Surgeon: Arnoldo Lenis, MD;  Location: [facility];  Service: Endoscopy;  Laterality: N/A;  . coarctation repair and residual restenosis    . KNEE SURGERY    . TEE WITHOUT CARDIOVERSION N/A 07/12/2019   Procedure: TRANSESOPHAGEAL ECHOCARDIOGRAM (TEE);  Surgeon: Sueanne Margarita, MD;  Location: [facility];  Service:  Cardiovascular;  Laterality: N/A;  . TEE WITHOUT CARDIOVERSION N/A 08/29/2019   Procedure: TRANSESOPHAGEAL ECHOCARDIOGRAM (TEE) WITH PROPOFOL;  Surgeon: Arnoldo Lenis, MD;  Location: [facility];  Service: Endoscopy;  Laterality: N/A;     Family History  Family history unknown: Yes     Social History   Socioeconomic History  . Marital status: Married    Spouse name: Not on file  . Number of children: Not on file  . Years of education: Not on file  . Highest education level: Not on file  Occupational History  . Not on file  Tobacco Use  . Smoking status: Never Smoker  . Smokeless tobacco: Never Used  Vaping Use  . Vaping Use: Never used  Substance and Sexual Activity  . Alcohol use: Yes    Alcohol/week: 0.0  standard drinks    Comment: 3 beers 3 nights a week  . Drug use: No  . Sexual activity: Never  Other Topics Concern  . Not on file  Social History Narrative  . Not on file   Social Determinants of Health   Financial Resource Strain:   . Difficulty of Paying Living Expenses: Not on file  Food Insecurity:   . Worried About Charity fundraiser in the Last Year: Not on file  . Ran Out of Food in the Last Year: Not on file  Transportation Needs:   . Lack of Transportation (Medical): Not on file  . Lack of Transportation (Non-Medical): Not on file  Physical Activity:   . Days of Exercise per Week: Not on file  . Minutes of Exercise per Session: Not on file  Stress:   . Feeling of Stress : Not on file  Social Connections:   . Frequency of Communication with Friends and Family: Not on file  . Frequency of Social Gatherings with Friends and Family: Not on file  . Attends Religious Services: Not on file  . Active Member of Clubs or Organizations: Not on file  . Attends Archivist Meetings: Not on file  . Marital Status: Not on file  Intimate Partner Violence:   . Fear of Current or Ex-Partner: Not on file  . Emotionally Abused: Not on file  . Physically Abused: Not on file  . Sexually Abused: Not on file     BP (!) 164/80   Pulse 69   Ht 5\' 4"  (1.626 m)   Wt 106 lb 9.6 oz (48.4 kg)   LMP 07/16/2013   SpO2 99%   BMI 18.30 kg/m   Physical Exam:  Well appearing 59 yo woman, NAD HEENT: Unremarkable Neck:  6 cm JVD, no thyromegally Lymphatics:  No adenopathy Back:  No CVA tenderness Lungs:  Clear with no wheezes. HEART:  Regular rate rhythm, 3/6 harsh systolic murmurs, no rubs, no clicks Abd:  soft, positive bowel sounds, no organomegally, no rebound, no guarding Ext:  2 plus pulses, trace peripheral Skin:  Diffuse reticular rash in the legs up to the mid thigh, no nodules Neuro:  CN II through XII intact, motor grossly intact  Assess/Plan: 1. Reticular rash  - she has new insurance and is willing to be seen by dermatology. I suspect that she needs a biopsy. 2. PAF/flutter - she is s/p MAZE and is maintaining NSR on propafenone. Her QRS from 3 months ago demonstrates a relatively narrow QRS. 3. Coarct - note that her residual coarct was not repaired. She will need anti-biotic prophylaxis. 4. Chronic diastolic heart failure - her symptoms  are class 2. No change.  Carleene Overlie Blanca Thornton,MD

## 2020-02-01 NOTE — Patient Instructions (Signed)
Medication Instructions:  Your physician recommends that you continue on your current medications as directed. Please refer to the Current Medication list given to you today.  *If you need a refill on your cardiac medications before your next appointment, please call your pharmacy*   Lab Work: NONE   If you have labs (blood work) drawn today and your tests are completely normal, you will receive your results only by: MyChart Message (if you have MyChart) OR A paper copy in the mail If you have any lab test that is abnormal or we need to change your treatment, we will call you to review the results.   Testing/Procedures: NONE    Follow-Up: At [facility], you and your health needs are our priority.  As part of our continuing mission to provide you with exceptional heart care, we have created designated Provider Care Teams.  These Care Teams include your primary Cardiologist (physician) and Advanced Practice Providers (APPs -  Physician Assistants and Nurse Practitioners) who all work together to provide you with the care you need, when you need it.  We recommend signing up for the patient portal called "MyChart".  Sign up information is provided on this After Visit Summary.  MyChart is used to connect with patients for Virtual Visits (Telemedicine).  Patients are able to view lab/test results, encounter notes, upcoming appointments, etc.  Non-urgent messages can be sent to your provider as well.   To learn more about what you can do with MyChart, go to [email or web address].    Your next appointment:   6 month(s)  The format for your next appointment:   In Person  Provider:   Gregg Taylor, MD   Other Instructions Thank you for choosing [facility]!    

## 2020-02-19 ENCOUNTER — Ambulatory Visit (INDEPENDENT_AMBULATORY_CARE_PROVIDER_SITE_OTHER): Payer: Self-pay | Admitting: *Deleted

## 2020-02-19 DIAGNOSIS — I480 Paroxysmal atrial fibrillation: Secondary | ICD-10-CM

## 2020-02-19 DIAGNOSIS — Z5181 Encounter for therapeutic drug level monitoring: Secondary | ICD-10-CM

## 2020-02-19 LAB — POCT INR: INR: 3.1 — AB (ref 2.0–3.0)

## 2020-02-19 NOTE — Patient Instructions (Signed)
Hold warfarin tonight then continue 2 tablets daily except 1 tablets on Mondays Recheck in 4 weeks

## 2020-02-22 ENCOUNTER — Other Ambulatory Visit: Payer: Self-pay

## 2020-02-22 DIAGNOSIS — Z20822 Contact with and (suspected) exposure to covid-19: Secondary | ICD-10-CM

## 2020-02-23 LAB — NOVEL CORONAVIRUS, NAA: SARS-CoV-2, NAA: NOT DETECTED

## 2020-02-23 LAB — SARS-COV-2, NAA 2 DAY TAT

## 2020-02-26 ENCOUNTER — Telehealth: Payer: Self-pay | Admitting: *Deleted

## 2020-02-26 DIAGNOSIS — I776 Arteritis, unspecified: Secondary | ICD-10-CM

## 2020-02-26 NOTE — Telephone Encounter (Signed)
Can order bilateral LE venous duplex on her per Dr. [name]  Pt notified and order placed.

## 2020-02-28 ENCOUNTER — Ambulatory Visit
Admission: EM | Admit: 2020-02-28 | Discharge: 2020-02-28 | Disposition: A | Discharge status: Home / Self Care | Payer: 59

## 2020-02-28 NOTE — ED Triage Notes (Signed)
Pt here for suture removal, has one stitch on back of left leg and one stitch on right leg from biopsy 2 weeks ago, skin approximated and removed without difficulty

## 2020-03-01 ENCOUNTER — Other Ambulatory Visit: Payer: Self-pay

## 2020-03-01 ENCOUNTER — Ambulatory Visit (HOSPITAL_COMMUNITY)
Admission: RE | Admit: 2020-03-01 | Discharge: 2020-03-01 | Disposition: A | Discharge status: Home / Self Care | Payer: 59 | Source: Ambulatory Visit | Attending: Cardiovascular Disease | Admitting: Cardiovascular Disease

## 2020-03-01 DIAGNOSIS — I776 Arteritis, unspecified: Secondary | ICD-10-CM | POA: Diagnosis not present

## 2020-04-01 ENCOUNTER — Ambulatory Visit (INDEPENDENT_AMBULATORY_CARE_PROVIDER_SITE_OTHER): Payer: 59 | Admitting: *Deleted

## 2020-04-01 ENCOUNTER — Other Ambulatory Visit: Payer: Self-pay

## 2020-04-01 DIAGNOSIS — Q231 Congenital insufficiency of aortic valve: Secondary | ICD-10-CM | POA: Diagnosis not present

## 2020-04-01 DIAGNOSIS — Z5181 Encounter for therapeutic drug level monitoring: Secondary | ICD-10-CM | POA: Diagnosis not present

## 2020-04-01 DIAGNOSIS — I4892 Unspecified atrial flutter: Secondary | ICD-10-CM | POA: Diagnosis not present

## 2020-04-01 DIAGNOSIS — I4891 Unspecified atrial fibrillation: Secondary | ICD-10-CM

## 2020-04-01 LAB — POCT INR: INR: 2.3 (ref 2.0–3.0)

## 2020-04-01 NOTE — Patient Instructions (Signed)
Continue warfarin 2 tablets daily except 1 tablets on Mondays Recheck in 4 weeks

## 2020-04-29 ENCOUNTER — Ambulatory Visit (INDEPENDENT_AMBULATORY_CARE_PROVIDER_SITE_OTHER): Payer: 59 | Admitting: *Deleted

## 2020-04-29 ENCOUNTER — Other Ambulatory Visit: Payer: Self-pay

## 2020-04-29 DIAGNOSIS — Z5181 Encounter for therapeutic drug level monitoring: Secondary | ICD-10-CM | POA: Diagnosis not present

## 2020-04-29 DIAGNOSIS — I4892 Unspecified atrial flutter: Secondary | ICD-10-CM

## 2020-04-29 DIAGNOSIS — I359 Nonrheumatic aortic valve disorder, unspecified: Secondary | ICD-10-CM

## 2020-04-29 DIAGNOSIS — Q231 Congenital insufficiency of aortic valve: Secondary | ICD-10-CM | POA: Diagnosis not present

## 2020-04-29 LAB — POCT INR: INR: 2.5 (ref 2.0–3.0)

## 2020-04-29 NOTE — Patient Instructions (Signed)
Continue warfarin 2 tablets daily except 1 tablets on Mondays Recheck in 5 weeks

## 2020-07-02 ENCOUNTER — Ambulatory Visit: Payer: Self-pay | Admitting: Internal Medicine

## 2020-07-16 ENCOUNTER — Telehealth: Payer: Self-pay | Admitting: *Deleted

## 2020-07-16 NOTE — Telephone Encounter (Signed)
Pt called to explain why physically and financially she has not been able to come for INR checks.  Pt has made an appt for Tuesday 07/23/20.

## 2020-07-16 NOTE — Telephone Encounter (Signed)
Patient called requesting to have Edrick Oh, RN to call her. No message given.

## 2020-07-17 ENCOUNTER — Other Ambulatory Visit: Payer: Self-pay | Admitting: Cardiovascular Disease

## 2020-07-17 NOTE — Telephone Encounter (Signed)
This is a [place] pt.  °

## 2020-07-29 ENCOUNTER — Other Ambulatory Visit: Payer: Self-pay

## 2020-07-29 ENCOUNTER — Ambulatory Visit (INDEPENDENT_AMBULATORY_CARE_PROVIDER_SITE_OTHER): Payer: 59 | Admitting: *Deleted

## 2020-07-29 DIAGNOSIS — I4892 Unspecified atrial flutter: Secondary | ICD-10-CM

## 2020-07-29 DIAGNOSIS — Q231 Congenital insufficiency of aortic valve: Secondary | ICD-10-CM

## 2020-07-29 DIAGNOSIS — Z5181 Encounter for therapeutic drug level monitoring: Secondary | ICD-10-CM | POA: Diagnosis not present

## 2020-07-29 LAB — POCT INR: INR: 2.2 (ref 2.0–3.0)

## 2020-07-29 NOTE — Patient Instructions (Signed)
Continue warfarin 2 tablets daily except 1 tablets on Mondays Recheck in 6 weeks

## 2020-08-29 ENCOUNTER — Other Ambulatory Visit: Payer: Self-pay

## 2020-08-29 ENCOUNTER — Other Ambulatory Visit (HOSPITAL_COMMUNITY): Payer: Self-pay | Admitting: Family Medicine

## 2020-08-29 ENCOUNTER — Ambulatory Visit (HOSPITAL_COMMUNITY)
Admission: RE | Admit: 2020-08-29 | Discharge: 2020-08-29 | Disposition: A | Discharge status: Home / Self Care | Payer: 59 | Source: Ambulatory Visit | Attending: Family Medicine | Admitting: Family Medicine

## 2020-08-29 ENCOUNTER — Other Ambulatory Visit: Payer: Self-pay | Admitting: Family Medicine

## 2020-08-29 DIAGNOSIS — R2241 Localized swelling, mass and lump, right lower limb: Secondary | ICD-10-CM | POA: Insufficient documentation

## 2020-09-09 ENCOUNTER — Ambulatory Visit (INDEPENDENT_AMBULATORY_CARE_PROVIDER_SITE_OTHER): Payer: 59 | Admitting: *Deleted

## 2020-09-09 DIAGNOSIS — I4892 Unspecified atrial flutter: Secondary | ICD-10-CM | POA: Diagnosis not present

## 2020-09-09 DIAGNOSIS — I359 Nonrheumatic aortic valve disorder, unspecified: Secondary | ICD-10-CM

## 2020-09-09 DIAGNOSIS — Q231 Congenital insufficiency of aortic valve: Secondary | ICD-10-CM | POA: Diagnosis not present

## 2020-09-09 DIAGNOSIS — Z5181 Encounter for therapeutic drug level monitoring: Secondary | ICD-10-CM | POA: Diagnosis not present

## 2020-09-09 LAB — POCT INR: INR: 1.7 — AB (ref 2.0–3.0)

## 2020-09-09 NOTE — Patient Instructions (Signed)
Take warfarin 2 1/2 tablets tonight then resume 2 tablets daily except 1 tablets on Mondays Recheck in 3 weeks

## 2020-10-02 ENCOUNTER — Other Ambulatory Visit: Payer: Self-pay | Admitting: Family Medicine

## 2020-10-02 DIAGNOSIS — L989 Disorder of the skin and subcutaneous tissue, unspecified: Secondary | ICD-10-CM

## 2020-10-07 ENCOUNTER — Ambulatory Visit (INDEPENDENT_AMBULATORY_CARE_PROVIDER_SITE_OTHER): Payer: 59 | Admitting: *Deleted

## 2020-10-07 DIAGNOSIS — Z5181 Encounter for therapeutic drug level monitoring: Secondary | ICD-10-CM | POA: Diagnosis not present

## 2020-10-07 DIAGNOSIS — I4892 Unspecified atrial flutter: Secondary | ICD-10-CM

## 2020-10-07 DIAGNOSIS — Q231 Congenital insufficiency of aortic valve: Secondary | ICD-10-CM

## 2020-10-07 LAB — POCT INR: INR: 1.6 — AB (ref 2.0–3.0)

## 2020-10-07 NOTE — Patient Instructions (Signed)
Increase warfarin to 2 tablets daily except 3 tablets on Mondays Recheck in 3 weeks

## 2020-10-09 NOTE — Progress Notes (Signed)
Patient ID: Mallory Sutton, female   DOB: 09-04-1960, 60 y.o.   MRN: [id number]      60 y.o. long term patient followed since 1999. She has had congenital heart disease with coarctation repair at age 27 via left thoracotomy. Known persistent left sided SVC  Followed for long time for bicuspid AV and aortic aneurysm Finally had AVR and arch repair by [name] in 2016. I was upset that he did not bypass her coarctation at the time She had PAF and MAZE with LAA occlusion. She has been stable on propafenone for years and followed by Dr Lovena Le in [place]  She developed a neuropathy and painful ? Vascular rash in her legs 2019 with short course of steroids.   Recent US RLE with complex cystic lesion in posterior upper left thigh   TEE done 08/29/19 showed normal EF she had no AR and mean gradient across her AVR 14 mmHg. She has not had recent MRI/CT imaging of her coarctation but the MLD had been < 1 cm in past     Her care has been compromised in past due to lack of insurance and financial issues She has been sporadic at times with her INR checks being subRx the last month   Vasculitis does not seem well Rx Still with lots of discoloration and some pain in legs Also developing more cysts She needs to have on removed by Dr Constance Haw from back of right thigh as it is pressing on nerve Also has one above right knee with lateral knee effusion   ROS: Denies fever, malais, weight loss, blurry vision, decreased visual acuity, cough, sputum, SOB, hemoptysis, pleuritic pain, palpitaitons, heartburn, abdominal pain, melena, lower extremity edema, claudication, or rash.  All other systems reviewed and negative  General: Vitals:   10/16/20 1354  BP: 138/66  Pulse: 69  SpO2: 97%   Affect appropriate Healthy:  appears stated age 59: normal Neck supple with no adenopathy JVP normal no bruits no thyromegaly Lungs clear with no wheezing and good diaphragmatic motion Heart:  S2/A7 click SEM through AVR  No AR   murmur, no rub, gallop or click PMI normal Post left thoracotomy and sternotomy. Coarctation murmur radiates to back  Abdomen: benighn, BS positve, no tenderness, no AAA no bruit.  No HSM or HJR Distal pulses intact with no bruits No edema Neuro non-focal Skin vasculitic rash arms and legs  Cystic mass back of right thigh With right knee effusion and cyst above knee  No muscular weakness   Current Outpatient Medications  Medication Sig Dispense Refill   aspirin EC 81 MG tablet Take 81 mg by mouth daily.     diazepam (VALIUM) 5 MG tablet Take 1 tablet (5 mg total) by mouth daily as needed for anxiety. 30 tablet 0   furosemide (LASIX) 40 MG tablet Take 40 mg by mouth daily.     gabapentin (NEURONTIN) 300 MG capsule Take 300 mg by mouth See admin instructions. Take 300 mg in the morning, 300 mg in the afternoon   600 mg at bedtime     metoprolol succinate (TOPROL XL) 25 MG 24 hr tablet Take 1 tablet (25 mg total) by mouth daily. 180 tablet 3   Multiple Vitamin (MULTI-VITAMINS) TABS Take 1 tablet by mouth daily.      propafenone (RYTHMOL) 300 MG tablet Take 300 mg by mouth 3 (three) times daily.     traMADol (ULTRAM) 50 MG tablet Take 1 tablet (50 mg total) by mouth 3 (  three) times daily. 90 tablet 3   triamcinolone cream (KENALOG) 0.1 % Apply 1 application topically daily as needed (Vasculitis).      Turmeric 500 MG CAPS Take 500 mg by mouth 3 (three) times a week.     warfarin (COUMADIN) 1 MG tablet TAKE 3 TABLETS DAILY, OR AS DIRECTED. 100 tablet 3   potassium chloride SA (KLOR-CON) 20 MEQ tablet Take 1 tablet (20 mEq total) by mouth daily. 90 tablet 1   No current facility-administered medications for this visit.    Allergies  Amiodarone and Penicillins  Electrocardiogram:  5/15  SR low voltage ? Old IMI   08/22/14  SR rate 89  Low voltage  Nonspecific ST changes 10/21/17 SR rate 52 LAFB no acute changes  Assessment and Plan  PAF:  2016 post MAZE/LAA exclusion stable on  propafenone and anticoagulation  AVR:  Update echo especially given subRx INR no change in murmur no AR on exam Coarctation:  Disagree with decision make to not repair coarctation at time of surgery for aneurysm and bicuspid valve at some point will need f/u MRI/MRA  HTN:  Well controlled.  Continue current medications and low sodium Dash type diet.   Aneursym:  In setting of bicuspid valve post  arch repair with graft.  Neuropathy/Rash:  Bilateral painful legs. Will refer to rheumatology as her vasculitis may not be ideally Rx She only takes pulsed steroids. She is afraid to take MTX.  Preoperative:  Ok to hold coumadin for 2 days prior to surgery Her INR have been low anyway  Prefer to have coumadin resumed the evening of or one day post operatively if possible   F/U with me in [place]

## 2020-10-15 ENCOUNTER — Ambulatory Visit (INDEPENDENT_AMBULATORY_CARE_PROVIDER_SITE_OTHER): Payer: 59 | Admitting: General Surgery

## 2020-10-15 ENCOUNTER — Other Ambulatory Visit: Payer: Self-pay

## 2020-10-15 ENCOUNTER — Encounter: Payer: Self-pay | Admitting: General Surgery

## 2020-10-15 VITALS — BP 138/81 | HR 68 | Temp 97.8°F | Resp 16 | Ht 64.0 in | Wt 97.0 lb

## 2020-10-15 DIAGNOSIS — L989 Disorder of the skin and subcutaneous tissue, unspecified: Secondary | ICD-10-CM

## 2020-10-15 NOTE — Patient Instructions (Signed)
Ask your cardiologist about holding your coumadin. We could do it without holding it but higher risk of bleeding.   Epidermoid Cyst  An epidermoid cyst, also known as epidermal cyst, is a sac made of skin tissue. The sac contains a substance called keratin. Keratin is a protein that is normally secreted through the hair follicles. When keratin becomes trapped in the top layer of skin (epidermis), it can form an epidermoid cyst. Epidermoid cysts can be found anywhere on your body. These cysts are usually harmless (benign), and they may not cause symptoms unless they become inflamed or infected. What are the causes? This condition may be caused by: A blocked hair follicle. A hair that curls and re-enters the skin instead of growing straight out of the skin (ingrown hair). A blocked pore. Irritated skin. An injury to the skin. Certain conditions that are passed along from parent to child (inherited). Human papillomavirus (HPV). This happens rarely when cysts occur on the bottom of the feet. Long-term (chronic) sun damage to the skin. What increases the risk? The following factors may make you more likely to develop an epidermoid cyst: Having acne. Being female. Having an injury to the skin. Being past puberty. Having certain rare genetic disorders. What are the signs or symptoms? The only symptom of this condition may be a small, painless lump underneath the skin. When an epidermal cyst ruptures, it may become inflamed. True infection in cysts is rare. Symptoms may include: Redness. Inflammation. Tenderness. Warmth. Keratin draining from the cyst. Keratin is grayish-white, bad-smelling substance. Pus draining from the cyst. How is this diagnosed? This condition is diagnosed with a physical exam. In some cases, you may have a sample of tissue (biopsy) taken from your cyst to be examined under a microscope or tested for bacteria. You may be referred to a health care provider who  specializes in skin care (dermatologist). How is this treated? If a cyst becomes inflamed, treatment may include: Opening and draining the cyst, done by a health care provider. After draining, minor surgery to remove the rest of the cyst may be done. Taking antibiotic medicine. Having injections of medicines (steroids) that help to reduce inflammation. Having surgery to remove the cyst. Surgery may be done if the cyst: Becomes large. Bothers you. Has a chance of turning into cancer. Do not try to open a cyst yourself. Follow these instructions at home: Medicines If you were prescribed an antibiotic medicine, take it it as told by your health care provider. Do not stop using the antibiotic even if you start to feel better. Take over-the-counter and prescription medicines only as told by your health care provider. General instructions Keep the area around your cyst clean and dry. Wear loose, dry clothing. Avoid touching your cyst. Check your cyst every day for signs of infection. Check for: Redness, swelling, or pain. Fluid or blood. Warmth. Pus or a bad smell. Keep all follow-up visits. This is important. How is this prevented? Wear clean, dry, clothing. Avoid wearing tight clothing. Keep your skin clean and dry. Take showers or baths every day. Contact a health care provider if: Your cyst develops symptoms of infection. Your condition is not improving or is getting worse. You develop a cyst that looks different from other cysts you have had. You have a fever. Get help right away if: Redness spreads from the cyst into the surrounding area. Summary An epidermoid cyst is a sac made of skin tissue. These cysts are usually harmless (benign), and they may  not cause symptoms unless they become inflamed. If a cyst becomes inflamed, treatment may include surgery to open and drain the cyst, or to remove it. Treatment may also include medicines by mouth or through an injection. Take  over-the-counter and prescription medicines only as told by your health care provider. If you were prescribed an antibiotic medicine, take it as told by your health care provider. Do not stop using the antibiotic even if you start to feel better. Contact a health care provider if your condition is not improving or is getting worse. Keep all follow-up visits as told by your health care provider. This is important. This information is not intended to replace advice given to you by your health care provider. Make sure you discuss any questions you have with your healthcare provider. Document Revised: 07/26/2019 Document Reviewed: 07/26/2019 Elsevier Patient Education  [place].

## 2020-10-15 NOTE — Progress Notes (Signed)
[facility] History and Physical  Reason for Referral: Right posterior thigh cyst  Referring Physician: Dr. Nevada Sutton  Chief Complaint   New Patient (Initial Visit)     Mallory Sutton is a 60 y.o. female.  HPI: [name] is a 60 yo with multiple medical issues including aortic stenosis, coarctation s/p two open heart surgeries, systolic murmur, A fib on warfarin who comes in with complaints of a right thigh lesion for several months that she can palpate and has had a US demonstrated a 1-1.5cm small cystic lesion.  She says that she feels it more prominently at times and says that it is painful. She wonders if it is pushing on a nerve. It hurts right in that are. She says it has not gotten any larger but just is more pain at times especially after being in the car and sitting. She also has an area on the right anterior thigh above the knee that feels like a knot. She has not had this imaged.   Past Medical History:  Diagnosis Date   Aortic stenosis    ATRIAL ARRHYTHMIAS    Bicuspid aortic valve    CHF (congestive heart failure) (HCC)    COARCTATION OF AORTA    ENDOMETRIOSIS    Heart murmur    Paroxysmal atrial fibrillation (HCC)    Shortness of breath    THORACIC AORTIC ANEURYSM     Past Surgical History:  Procedure Laterality Date   CARDIOVERSION N/A 08/29/2019   Procedure: CARDIOVERSION;  Surgeon: Arnoldo Lenis, MD;  Location: [facility];  Service: Endoscopy;  Laterality: N/A;   coarctation repair and residual restenosis     KNEE SURGERY     TEE WITHOUT CARDIOVERSION N/A 07/12/2019   Procedure: TRANSESOPHAGEAL ECHOCARDIOGRAM (TEE);  Surgeon: Sueanne Margarita, MD;  Location: [facility] ENDOSCOPY;  Service: Cardiovascular;  Laterality: N/A;   TEE WITHOUT CARDIOVERSION N/A 08/29/2019   Procedure: TRANSESOPHAGEAL ECHOCARDIOGRAM (TEE) WITH PROPOFOL;  Surgeon: Arnoldo Lenis, MD;  Location: [facility];  Service: Endoscopy;  Laterality: N/A;    Family History  Family history  unknown: Yes    Social History   Tobacco Use   Smoking status: Never   Smokeless tobacco: Never  Vaping Use   Vaping Use: Never used  Substance Use Topics   Alcohol use: Yes    Alcohol/week: 0.0 standard drinks    Comment: 3 beers 3 nights a week   Drug use: No    Medications: I have reviewed the patient's current medications. Allergies as of 10/15/2020       Reactions   Amiodarone Nausea Only   Penicillins Hives   Has patient had a PCN reaction causing immediate rash, facial/tongue/throat swelling, SOB or lightheadedness with hypotension: YES Has patient had a PCN reaction causing severe rash involving mucus membranes or skin necrosis: NO Has patient had a PCN reaction that required hospitalization NO Has patient had a PCN reaction occurring within the last 10 years: NO If all of the above answers are "NO", then may proceed with Cephalosporin use.        Medication List        Accurate as of October 15, 2020 11:28 AM. If you have any questions, ask your nurse or doctor.          aspirin EC 81 MG tablet Take 81 mg by mouth daily.   diazepam 5 MG tablet Commonly known as: Valium Take 1 tablet (5 mg total) by mouth daily as needed for anxiety.  furosemide 40 MG tablet Commonly known as: LASIX Take 40 mg by mouth daily.   gabapentin 300 MG capsule Commonly known as: NEURONTIN Take 300 mg by mouth See admin instructions. Take 300 mg in the morning, 300 mg in the afternoon   600 mg at bedtime   metoprolol succinate 25 MG 24 hr tablet Commonly known as: Toprol XL Take 1 tablet (25 mg total) by mouth daily.   Multi-Vitamins Tabs Take 1 tablet by mouth daily.   potassium chloride SA 20 MEQ tablet Commonly known as: KLOR-CON Take 1 tablet (20 mEq total) by mouth daily.   propafenone 300 MG tablet Commonly known as: RYTHMOL Take 300 mg by mouth 3 (three) times daily. What changed: Another medication with the same name was removed. Continue taking this  medication, and follow the directions you see here. Changed by: Virl Cagey, MD   traMADol 50 MG tablet Commonly known as: ULTRAM Take 1 tablet (50 mg total) by mouth 3 (three) times daily.   triamcinolone cream 0.1 % Commonly known as: KENALOG Apply 1 application topically daily as needed (Vasculitis).   Turmeric 500 MG Caps Take 500 mg by mouth 3 (three) times a week.   warfarin 1 MG tablet Commonly known as: COUMADIN Take as directed by the anticoagulation clinic. If you are unsure how to take this medication, talk to your nurse or doctor. Original instructions: TAKE 3 TABLETS DAILY, OR AS DIRECTED.         ROS:  A comprehensive review of systems was negative except for: Cardiovascular: positive for coarctation, murmur, vasculitis  Musculoskeletal: positive for extremity pain from vasculitis  Neurological: positive for weakness and neuropathy  Blood pressure 138/81, pulse 68, temperature 97.8 F (36.6 C), temperature source Other (Comment), resp. rate 16, height 5\' 4"  (1.626 m), weight 97 lb (44 kg), last menstrual period 07/16/2013, SpO2 95 %. Physical Exam Vitals reviewed.  Constitutional:      Appearance: She is underweight.  HENT:     Head: Normocephalic.     Mouth/Throat:     Mouth: Mucous membranes are moist.  Eyes:     Extraocular Movements: Extraocular movements intact.  Cardiovascular:     Rate and Rhythm: Normal rate.     Heart sounds: Murmur heard.  Pulmonary:     Effort: Pulmonary effort is normal.  Abdominal:     General: There is no distension.     Palpations: Abdomen is soft.     Tenderness: There is no abdominal tenderness.  Musculoskeletal:        General: No swelling.     Cervical back: Normal range of motion.     Comments: Some skin mottling of the lower extremities, chronic from vasculitis, right posterior thigh lower/ mid thigh no obvious mass; bilateral knees with fluid superiorly, right lower thigh/ above knee, hard nodule, 1-2cm in  size, tender  Skin:    General: Skin is warm.  Neurological:     General: No focal deficit present.     Mental Status: She is alert and oriented to person, place, and time.  Psychiatric:        Mood and Affect: Mood normal.        Behavior: Behavior normal.    Results: CLINICAL DATA:  Right upper posterior thigh mass with hip pain.   EXAM: ULTRASOUND right LOWER EXTREMITY LIMITED   TECHNIQUE: Ultrasound examination of the lower extremity soft tissues was performed in the area of clinical concern.   COMPARISON:  None.  FINDINGS: In the region of concern along the right posterior upper thigh, a complicated appearing cystic lesion measuring 1.5 by 0.8 by 1.0 cm is present in the subcutaneous tissues. There is blood flow along the periphery of this lesion but not internally.   IMPRESSION: 1. Complex cystic lesion in the subcutaneous tissues in the region of concern.     Electronically Signed   By: Van Clines M.D.   On: 08/29/2020 14:39    Assessment & Plan:  Mallory Sutton is a 60 y.o. female with a possible cyst on the right posterior thigh seen several weeks ago on [place]. She has tenderness in this area. I am unable to palpate anything today but discussed the option of seeing if there is something with [place] in the procedure room and doing a local excision.  Discussed looking at the right anterior lower thigh at the same time to see if there was a cyst there too. Discussed that we could do this and find nothing in either place. Discussed that the pain may not resolve. Discussed her coumadin and for her to discuss with Dr. [name] to see his thoughts of risk of holding for any superficial surgery.  If risk of holding it outweighs risk of bleeding, will just plan to remove and use cautery knowing higher risk of bleeding. She has been subtherapeutic recently.    All questions were answered to the satisfaction of the patient. She will call us and let us know what she wants to do.  Discussed excision and risk of bleeding, infection, recurrence, and not helping the pain.    Virl Cagey 10/15/2020, 11:28 AM

## 2020-10-16 ENCOUNTER — Ambulatory Visit (INDEPENDENT_AMBULATORY_CARE_PROVIDER_SITE_OTHER): Payer: 59 | Admitting: Cardiovascular Disease

## 2020-10-16 ENCOUNTER — Encounter: Payer: Self-pay | Admitting: Cardiovascular Disease

## 2020-10-16 VITALS — BP 138/66 | HR 69 | Ht 64.0 in | Wt 100.0 lb

## 2020-10-16 DIAGNOSIS — I776 Arteritis, unspecified: Secondary | ICD-10-CM

## 2020-10-16 DIAGNOSIS — Z01810 Encounter for preprocedural cardiovascular examination: Secondary | ICD-10-CM | POA: Diagnosis not present

## 2020-10-16 DIAGNOSIS — I480 Paroxysmal atrial fibrillation: Secondary | ICD-10-CM

## 2020-10-16 DIAGNOSIS — Q251 Coarctation of aorta: Secondary | ICD-10-CM

## 2020-10-16 DIAGNOSIS — Z952 Presence of prosthetic heart valve: Secondary | ICD-10-CM

## 2020-10-16 DIAGNOSIS — Z7901 Long term (current) use of anticoagulants: Secondary | ICD-10-CM

## 2020-10-16 DIAGNOSIS — Z5181 Encounter for therapeutic drug level monitoring: Secondary | ICD-10-CM

## 2020-10-16 NOTE — Patient Instructions (Signed)
Medication Instructions:  Your physician recommends that you continue on your current medications as directed. Please refer to the Current Medication list given to you today.  *If you need a refill on your cardiac medications before your next appointment, please call your pharmacy*   Lab Work: None today  If you have labs (blood work) drawn today and your tests are completely normal, you will receive your results only by: [place] (if you have MyChart) OR A paper copy in the mail If you have any lab test that is abnormal or we need to change your treatment, we will call you to review the results.   Testing/Procedures: None today    Follow-Up: At [facility], you and your health needs are our priority.  As part of our continuing mission to provide you with exceptional heart care, we have created designated Provider Care Teams.  These Care Teams include your primary Cardiologist (physician) and Advanced Practice Providers (APPs -  Physician Assistants and Nurse Practitioners) who all work together to provide you with the care you need, when you need it.  We recommend signing up for the patient portal called "MyChart".  Sign up information is provided on this After Visit Summary.  MyChart is used to connect with patients for Virtual Visits (Telemedicine).  Patients are able to view lab/test results, encounter notes, upcoming appointments, etc.  Non-urgent messages can be sent to your provider as well.   To learn more about what you can do with MyChart, go to [email or web address].    Your next appointment:   3 month(s)  The format for your next appointment:   In Person  Provider:   Jenkins Rouge, MD   Other Instructions None

## 2020-10-24 ENCOUNTER — Other Ambulatory Visit (HOSPITAL_COMMUNITY): Payer: Self-pay | Admitting: Family Medicine

## 2020-10-24 ENCOUNTER — Other Ambulatory Visit: Payer: Self-pay | Admitting: Family Medicine

## 2020-10-25 ENCOUNTER — Other Ambulatory Visit (HOSPITAL_COMMUNITY): Payer: Self-pay | Admitting: Family Medicine

## 2020-10-25 DIAGNOSIS — R2241 Localized swelling, mass and lump, right lower limb: Secondary | ICD-10-CM

## 2020-10-29 ENCOUNTER — Other Ambulatory Visit: Payer: Self-pay

## 2020-10-29 ENCOUNTER — Other Ambulatory Visit (HOSPITAL_COMMUNITY): Payer: Self-pay | Admitting: Family Medicine

## 2020-10-29 ENCOUNTER — Other Ambulatory Visit: Payer: Self-pay | Admitting: Internal Medicine

## 2020-10-29 ENCOUNTER — Ambulatory Visit (HOSPITAL_COMMUNITY)
Admission: RE | Admit: 2020-10-29 | Discharge: 2020-10-29 | Disposition: A | Discharge status: Home / Self Care | Payer: 59 | Source: Ambulatory Visit | Attending: Family Medicine | Admitting: Family Medicine

## 2020-10-29 DIAGNOSIS — R2241 Localized swelling, mass and lump, right lower limb: Secondary | ICD-10-CM

## 2020-10-30 ENCOUNTER — Ambulatory Visit (INDEPENDENT_AMBULATORY_CARE_PROVIDER_SITE_OTHER): Payer: 59 | Admitting: *Deleted

## 2020-10-30 DIAGNOSIS — I4892 Unspecified atrial flutter: Secondary | ICD-10-CM | POA: Diagnosis not present

## 2020-10-30 DIAGNOSIS — Q231 Congenital insufficiency of aortic valve: Secondary | ICD-10-CM

## 2020-10-30 DIAGNOSIS — Z5181 Encounter for therapeutic drug level monitoring: Secondary | ICD-10-CM | POA: Diagnosis not present

## 2020-10-30 LAB — POCT INR: INR: 4.4 — AB (ref 2.0–3.0)

## 2020-10-30 NOTE — Patient Instructions (Signed)
Hold warfarin tonight, take 1 tablet tomorrow then decrease dose to 2 tablets daily  Recheck in 2 weeks

## 2020-11-01 ENCOUNTER — Other Ambulatory Visit: Payer: Self-pay

## 2020-11-01 ENCOUNTER — Ambulatory Visit: Payer: 59

## 2020-11-01 ENCOUNTER — Ambulatory Visit (INDEPENDENT_AMBULATORY_CARE_PROVIDER_SITE_OTHER): Payer: 59 | Admitting: Orthopedic Surgery

## 2020-11-01 ENCOUNTER — Encounter: Payer: Self-pay | Admitting: Orthopedic Surgery

## 2020-11-01 VITALS — BP 157/71 | HR 84 | Ht 64.0 in | Wt 99.4 lb

## 2020-11-01 DIAGNOSIS — M25561 Pain in right knee: Secondary | ICD-10-CM

## 2020-11-01 DIAGNOSIS — G8929 Other chronic pain: Secondary | ICD-10-CM

## 2020-11-01 NOTE — Patient Instructions (Signed)

## 2020-11-01 NOTE — Progress Notes (Signed)
New Patient Visit  Assessment: Mallory Sutton is a 60 y.o. female with the following: 1. Chronic pain of right knee  Plan: Reviewed radiographs with the patient in clinic today which demonstrates some mild degenerative changes within the right knee.  On physical exam, she does have a noticeable effusion.  After discussing our options, she is elected to proceed with a steroid injection.  We also aspirated the knee in clinic today.  She can continue with medications as needed.  Encouraged her to stay as active as possible.  If she has any issues, she should return to clinic for repeat evaluation.  Procedure note aspiration/injection Right knee joint   Verbal consent was obtained to inject the right knee joint  Timeout was completed to confirm the site of injection.  The skin was prepped with alcohol and ethyl chloride was sprayed at the injection site.  An 18-gauge needle was introduced into the superior lateral aspect of the right knee and approximately 35 cc of clear joint fluid was aspirated.  Using the same needle, 40 mg of Depo-Medrol and 1% lidocaine (3 cc) into the right knee. There were no complications. A sterile bandage was applied.    Follow-up: Return if symptoms worsen or fail to improve.  Subjective:  Chief Complaint  Patient presents with   Knee Pain    Rt knee swelling and pain for "awhile".     History of Present Illness: Mallory Sutton is a 60 y.o. female who has been referred to clinic today by Valentino Nose, FNP for evaluation of right knee pain.  She has had some aching pains in the right knee, as well as swelling for quite a while.  No specific injury.  The aching pains are diffuse.  She cannot localize it.  She has been taking some medications as needed.  Otherwise, she has good range of motion in her knee.  She is doing more physical therapy.  She does have a remote history of surgery on her left knee, although she is not sure what was done.  She also has some nodules  proximal to the knee, which are sometimes painful.  She also has history of vasculitis.   Review of Systems: No fevers or chills No numbness or tingling No chest pain No shortness of breath No bowel or bladder dysfunction No GI distress No headaches   Medical History:  Past Medical History:  Diagnosis Date   Aortic stenosis    ATRIAL ARRHYTHMIAS    Bicuspid aortic valve    CHF (congestive heart failure) (HCC)    COARCTATION OF AORTA    ENDOMETRIOSIS    Heart murmur    Paroxysmal atrial fibrillation ([place])    Shortness of breath    THORACIC AORTIC ANEURYSM     Past Surgical History:  Procedure Laterality Date   CARDIOVERSION N/A 08/29/2019   Procedure: CARDIOVERSION;  Surgeon: Arnoldo Lenis, MD;  Location: [facility];  Service: Endoscopy;  Laterality: N/A;   coarctation repair and residual restenosis     KNEE SURGERY     TEE WITHOUT CARDIOVERSION N/A 07/12/2019   Procedure: TRANSESOPHAGEAL ECHOCARDIOGRAM (TEE);  Surgeon: Sueanne Margarita, MD;  Location: [facility] ENDOSCOPY;  Service: Cardiovascular;  Laterality: N/A;   TEE WITHOUT CARDIOVERSION N/A 08/29/2019   Procedure: TRANSESOPHAGEAL ECHOCARDIOGRAM (TEE) WITH PROPOFOL;  Surgeon: Arnoldo Lenis, MD;  Location: [facility];  Service: Endoscopy;  Laterality: N/A;    Family History  Family history unknown: Yes   Social History  Tobacco Use   Smoking status: Never   Smokeless tobacco: Never  Vaping Use   Vaping Use: Never used  Substance Use Topics   Alcohol use: Yes    Alcohol/week: 0.0 standard drinks    Comment: 3 beers 3 nights a week   Drug use: No    Allergies  Allergen Reactions   Amiodarone Nausea Only   Penicillins Hives    Has patient had a PCN reaction causing immediate rash, facial/tongue/throat swelling, SOB or lightheadedness with hypotension: YES Has patient had a PCN reaction causing severe rash involving mucus membranes or skin necrosis: NO Has patient had a PCN reaction that required  hospitalization NO Has patient had a PCN reaction occurring within the last 10 years: NO If all of the above answers are "NO", then may proceed with Cephalosporin use.     Current Meds  Medication Sig   aspirin EC 81 MG tablet Take 81 mg by mouth daily.   diazepam (VALIUM) 5 MG tablet Take 1 tablet (5 mg total) by mouth daily as needed for anxiety.   furosemide (LASIX) 40 MG tablet Take 40 mg by mouth daily.   gabapentin (NEURONTIN) 300 MG capsule Take 300 mg by mouth See admin instructions. Take 300 mg in the morning, 300 mg in the afternoon   600 mg at bedtime   HYDROcodone-acetaminophen (NORCO/VICODIN) 5-325 MG tablet Take 2 tablets by mouth 2 (two) times daily.   metoprolol succinate (TOPROL-XL) 25 MG 24 hr tablet TAKE 1 TABLET BY MOUTH ONCE DAILY.   Multiple Vitamin (MULTI-VITAMINS) TABS Take 1 tablet by mouth daily.    propafenone (RYTHMOL) 300 MG tablet Take 300 mg by mouth 3 (three) times daily.   traMADol (ULTRAM) 50 MG tablet Take 1 tablet (50 mg total) by mouth 3 (three) times daily.   triamcinolone cream (KENALOG) 0.1 % Apply 1 application topically daily as needed (Vasculitis).    Turmeric 500 MG CAPS Take 500 mg by mouth 3 (three) times a week.   warfarin (COUMADIN) 1 MG tablet TAKE 3 TABLETS DAILY, OR AS DIRECTED.    Objective: BP (!) 157/71   Pulse 84   Ht 5\' 4"  (1.626 m)   Wt 99 lb 6.4 oz (45.1 kg)   LMP 07/16/2013   BMI 17.06 kg/m   Physical Exam:  General: Alert and oriented. and No acute distress. Gait: Normal gait.  Thin female.  Evaluation of the right knee demonstrates a moderate effusion.  Mild tenderness palpation along the medial joint line.  Range of motion from full extension to approximately 130 degrees of flexion without discomfort.  Negative Lachman.  No increased laxity to varus or valgus stress.  Maintain a straight leg raise.  Toes are warm well perfused.  Active motion intact in the TA/EHL   IMAGING: I personally ordered and reviewed the  following images  X-rays of the right knee were obtained in clinic today and demonstrates maintenance of the joint space.  Mild degenerative changes are noted within all compartments, small osteophytes.  Single view of the left knee demonstrates valgus alignment with complete loss of joint space laterally.  Impression: Mild right knee degenerative changes; severe left knee arthritis, specifically within the lateral compartment.     New Medications:  No orders of the defined types were placed in this encounter.     Mordecai Rasmussen, MD  11/01/2020 1:41 PM

## 2020-11-08 ENCOUNTER — Other Ambulatory Visit: Payer: Self-pay | Admitting: Internal Medicine

## 2020-11-27 ENCOUNTER — Ambulatory Visit: Payer: 59 | Admitting: Internal Medicine

## 2020-12-02 ENCOUNTER — Ambulatory Visit (INDEPENDENT_AMBULATORY_CARE_PROVIDER_SITE_OTHER): Payer: 59 | Admitting: *Deleted

## 2020-12-02 ENCOUNTER — Other Ambulatory Visit: Payer: Self-pay

## 2020-12-02 DIAGNOSIS — I4892 Unspecified atrial flutter: Secondary | ICD-10-CM | POA: Diagnosis not present

## 2020-12-02 DIAGNOSIS — Z5181 Encounter for therapeutic drug level monitoring: Secondary | ICD-10-CM

## 2020-12-02 DIAGNOSIS — Q231 Congenital insufficiency of aortic valve: Secondary | ICD-10-CM | POA: Diagnosis not present

## 2020-12-02 LAB — POCT INR: INR: 2.1 (ref 2.0–3.0)

## 2020-12-02 NOTE — Patient Instructions (Signed)
Description   Continue to take warfarin 2 tablets daily. Recheck in 3 weeks

## 2020-12-03 ENCOUNTER — Other Ambulatory Visit: Payer: Self-pay | Admitting: Cardiology

## 2020-12-25 ENCOUNTER — Telehealth: Payer: Self-pay

## 2020-12-25 NOTE — Telephone Encounter (Signed)
LMOM FOR OVERDUE INR 

## 2020-12-30 NOTE — Progress Notes (Signed)
Office Visit Note  Patient: Mallory Sutton             Date of Birth: 02/09/1961           MRN: [id number]             PCP: Celene Squibb, MD Referring: Josue Hector, MD Visit Date: 12/31/2020  Subjective:  New Patient (Initial Visit) (Patient complains of bilateral hand pain and swelling. Patient complains of vasculitis causing skin changes and pain.)   History of Present Illness: Mallory Sutton is a 60 y.o. 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 Leg lesions were first reported starting in 2019 biopsy consistent with vasculitis labs overall negative. She had recent right knee aspiration by orthopedic surgery.  She has chronic pain in the affected areas on her lower extremities not particularly debilitating and she does not notice specific improvement or worsening with position and activity.  Bilateral hand pain has worsened recently especially on the right hand with episodic swelling.  Outside of the legs no particular skin rashes or other visible changes.  Labs reviewed 07/2017 ANA neg RF 10.9 ESR 9 CMP unremarkable  Imaging reviewed 11/01/20 Xray right knee 3 views left knee 1 view Impression: Mild right knee degenerative changes; severe left knee arthritis, specifically within the lateral compartment.  Activities of Daily Living:  Patient reports morning stiffness for 0 minutes.   Patient Reports nocturnal pain.  Difficulty dressing/grooming: Denies Difficulty climbing stairs: Reports Difficulty getting out of chair: Denies Difficulty using hands for taps, buttons, cutlery, and/or writing: Reports  Review of Systems  Constitutional:  Positive for fatigue.  HENT:  Negative for mouth sores, mouth dryness and nose dryness.   Eyes:  Negative for pain, itching,  visual disturbance and dryness.  Respiratory:  Positive for shortness of breath. Negative for cough, hemoptysis and difficulty breathing.   Cardiovascular:  Positive for irregular heartbeat and swelling in legs/feet. Negative for chest pain and palpitations.  Gastrointestinal:  Negative for abdominal pain, blood in stool, constipation and diarrhea.  Endocrine: Negative for increased urination.  Genitourinary:  Negative for painful urination.  Musculoskeletal:  Positive for joint pain, joint pain and joint swelling. Negative for myalgias, muscle weakness, morning stiffness, muscle tenderness and myalgias.  Skin:  Positive for color change, rash and redness.  Allergic/Immunologic: Negative for susceptible to infections.  Neurological:  Negative for dizziness, numbness, headaches, memory loss and weakness.  Hematological:  Negative for swollen glands.  Psychiatric/Behavioral:  Positive for sleep disturbance. Negative for confusion.    PMFS History:  Patient Active Problem List   Diagnosis Date Noted   Vasculitis ([place]) 12/31/2020   Bilateral hand pain 12/31/2020   Benign skin lesion of thigh 10/15/2020   Atrial flutter (HCC)    Acute diastolic heart failure ([place]) 08/23/2014   Pleural effusion 08/23/2014   Pleural effusion, bilateral 08/23/2014   Dyspnea    Acute respiratory failure ([place]) 08/22/2014   Atrial fibrillation with RVR ([place]) 08/06/2014   Encounter for therapeutic drug monitoring 08/06/2014   S/P aortic valve replacement-07/25/14 at [facility] 08/06/2014   Shortness of breath 04/23/2010   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 07/25/14 [facility] 03/12/2010   Aortic valve disorder 09/12/2009   ENDOMETRIOSIS 09/12/2009   COARCTATION OF AORTA- apprently not repaired at  recent surgery 09/12/2009   Paroxysmal atrial fibrillation ([place]) 07/18/2008   BICUSPID AORTIC VALVE 07/18/2008    Past Medical History:  Diagnosis Date   Aortic stenosis    ATRIAL ARRHYTHMIAS    Bicuspid  aortic valve    CHF (congestive heart failure) (HCC)    COARCTATION OF AORTA    ENDOMETRIOSIS    Heart murmur    Paroxysmal atrial fibrillation (HCC)    Shortness of breath    THORACIC AORTIC ANEURYSM     Family History  Problem Relation Age of Onset   Suicidality Mother    Mental illness Mother    Drug abuse Brother    Healthy Daughter    Healthy Son    Past Surgical History:  Procedure Laterality Date   CARDIOVERSION N/A 08/29/2019   Procedure: CARDIOVERSION;  Surgeon: Arnoldo Lenis, MD;  Location: [facility];  Service: Endoscopy;  Laterality: N/A;   coarctation repair and residual restenosis     KNEE SURGERY     TEE WITHOUT CARDIOVERSION N/A 07/12/2019   Procedure: TRANSESOPHAGEAL ECHOCARDIOGRAM (TEE);  Surgeon: Sueanne Margarita, MD;  Location: [facility] ENDOSCOPY;  Service: Cardiovascular;  Laterality: N/A;   TEE WITHOUT CARDIOVERSION N/A 08/29/2019   Procedure: TRANSESOPHAGEAL ECHOCARDIOGRAM (TEE) WITH PROPOFOL;  Surgeon: Arnoldo Lenis, MD;  Location: [facility];  Service: Endoscopy;  Laterality: N/A;   Social History   Social History Narrative   Not on file   Immunization History  Administered Date(s) Administered   Influenza,inj,Quad PF,6+ Mos 03/26/2014, 02/18/2015, 02/10/2016, 03/15/2017, 03/06/2019, 02/01/2020     Objective: Vital Signs: BP (!) 147/79 (BP Location: Right Arm, Patient Position: Sitting, Cuff Size: Normal)   Pulse 69   Ht 5' 3" (1.6 m)   Wt 104 lb 3.2 oz (47.3 kg)   LMP 07/16/2013   BMI 18.46 kg/m    Physical Exam Cardiovascular:     Rate and Rhythm: Normal rate and regular rhythm.  Pulmonary:     Effort: Pulmonary effort is normal.     Breath sounds: Normal breath sounds.  Skin:    General: Skin is warm and dry.     Findings: Bruising and rash present.     Comments: Palpable purpuric lesions on bilateral lower extremities most scant fluid on the distal shins and calves extending not all the way to knees Mild pedal edema bilaterally Scattered  ecchymoses worst on distal extensor surfaces No digital pitting ulceration or tapering  Neurological:     Mental Status: She is alert.     Musculoskeletal Exam:  Shoulders full ROM no tenderness or swelling Elbows full ROM no tenderness or swelling Wrists full ROM no tenderness or swelling Fingers some enlargement very slight deviation on right hand MCP joints, some tenderness and squaring at base of left thumb probable early Heberden's nodes no palpable synovitis Knees full ROM no tenderness or swelling   Investigation: No additional findings.  Imaging: DG Chest 2 View  Result Date: 01/14/2021 CLINICAL DATA:  Cough, dyspnea EXAM: CHEST - 2 VIEW COMPARISON:  01/23/2015 FINDINGS: The lungs are mildly hyperinflated in keeping with changes of underlying COPD. Mild asymmetric bilateral perihilar bronchitic changes have developed with interstitial thickening noted. No confluent pulmonary infiltrate. No pneumothorax or pleural effusion. Aortic valve replacement and left atrial clipping has been performed. Cardiac size within normal limits. Pulmonary vascularity is normal. Surgical clips again noted within the right axilla. No acute bone abnormality. Stable L2 compression deformity with resultant accentuated thoracolumbar kyphosis. IMPRESSION: Interval development of mild bilateral bronchitic  change with progressive pulmonary hyperinflation, in keeping with associated air trapping. No confluent pulmonary infiltrate. Electronically Signed   By: Fidela Salisbury M.D.   On: 01/14/2021 13:41   XR Hand 2 View Left  Result Date: 01/07/2021 Xray left hand 2 views Radiocarpal joint appears normal. Moderate 1st CMC joint degenerative arthritis. Cystic changes of metacarpal heads with MCP joint space preserved with no subluxation or deviation. PIP and DIP joint spaces appear preserved. 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 Moderate 1st CMC joint arthritis with cystic change in base of 1st metacarpal. Subluxation of 2nd and 3rd MCPs and lateral deviation of MCP joints. Cystic changes and small osteophytes no erosions seen. PIP and DIP joints appear normal. Bone mineralization possible generalized osteopenia. Impression Advanced degenerative changes with MCP subluxation and deviation suggestive for secondary or inflammatory process, distal joints spared, no erosive disease   Recent Labs: Lab Results  Component Value Date   WBC 8.5 08/18/2019   HGB 13.3 08/18/2019   PLT 428 (H) 08/18/2019   NA 137 08/25/2019   K 3.2 (L) 08/25/2019   CL 99 08/25/2019   CO2 29 08/25/2019   GLUCOSE 105 (H) 08/25/2019   BUN 13 08/25/2019   CREATININE 0.88 08/25/2019   BILITOT 0.8 07/20/2017   ALKPHOS 99 07/20/2017   AST 19 07/20/2017   ALT 14 07/20/2017   PROT 8.3 (H) 07/20/2017   ALBUMIN 3.8 07/20/2017   CALCIUM 8.5 (L) 08/25/2019   GFRAA >60 08/25/2019    Speciality Comments: No specialty comments available.  Procedures:  No procedures performed Allergies: Amiodarone and Penicillins   Assessment / Plan:     Visit Diagnoses: Vasculitis ([place]) - Plan: Sedimentation rate, Cryoglobulin, Fluorescent treponemal ab(fta)-IgG-bld, Cyclic citrul peptide antibody, IgG, ANCA Screen Reflex Titer(QUEST), Beta-2 glycoprotein antibodies, Cardiolipin antibodies, IgG, IgM, IgA  Bilateral lower extremity vasculitis no current known systemic cause though she has an extensive and complicated medical history.  We will check multiple serology for systemic causes including cryoglobulins, RPR, ANCA, antiphospholipid antibodies, CCP, and checking sed rate today.  There is no particular history to suggest medication induced from timing consideration.  Bilateral hand pain - Plan: XR Hand 2 View Right, XR Hand 2 View Left  Hand pain with mild joint enlargement  no palpable synovitis or obvious inflammation on exam today.  Could be consistent with primary osteoarthritis although MCP joint involvement is common and secondary cause.  Bilateral hand x-rays today shows first [facility] joint osteoarthritis right hand second third MCP joint changes are present but no erosions.  Could benefit with repeat exam possibly ultrasound inspection of affected joints.  Orders: Orders Placed This Encounter  Procedures   XR Hand 2 View Right   XR Hand 2 View Left   Sedimentation rate   Cryoglobulin   Fluorescent treponemal ab(fta)-IgG-bld   Cyclic citrul peptide antibody, IgG   ANCA Screen Reflex Titer(QUEST)   Beta-2 glycoprotein antibodies   Cardiolipin antibodies, IgG, IgM, IgA    No orders of the defined types were placed in this encounter.    Follow-Up Instructions: Return in about 2 weeks (around 01/14/2021) for New pt vasculitis f/u 2 wks.   Collier Salina, MD  Note - This record has been created using [employer].  Chart creation errors have been sought, but may not always  have been located. Such  creation errors do not reflect on  the standard of medical care.

## 2020-12-31 ENCOUNTER — Ambulatory Visit: Payer: Self-pay

## 2020-12-31 ENCOUNTER — Other Ambulatory Visit: Payer: Self-pay

## 2020-12-31 ENCOUNTER — Ambulatory Visit (INDEPENDENT_AMBULATORY_CARE_PROVIDER_SITE_OTHER): Payer: 59 | Admitting: Internal Medicine

## 2020-12-31 ENCOUNTER — Encounter: Payer: Self-pay | Admitting: Internal Medicine

## 2020-12-31 VITALS — BP 147/79 | HR 69 | Ht 63.0 in | Wt 104.2 lb

## 2020-12-31 DIAGNOSIS — M79642 Pain in left hand: Secondary | ICD-10-CM | POA: Insufficient documentation

## 2020-12-31 DIAGNOSIS — I776 Arteritis, unspecified: Secondary | ICD-10-CM

## 2020-12-31 DIAGNOSIS — M79641 Pain in right hand: Secondary | ICD-10-CM | POA: Diagnosis not present

## 2021-01-06 LAB — FLUORESCENT TREPONEMAL AB(FTA)-IGG-BLD: Fluorescent Treponemal ABS: NONREACTIVE

## 2021-01-06 LAB — ANCA SCREEN W REFLEX TITER: ANCA Screen: NEGATIVE

## 2021-01-06 LAB — CARDIOLIPIN ANTIBODIES, IGG, IGM, IGA
Anticardiolipin IgA: <2 APL-U/mL
Anticardiolipin IgG: <2 GPL-U/mL
Anticardiolipin IgM: <2 MPL-U/mL

## 2021-01-06 LAB — BETA-2 GLYCOPROTEIN ANTIBODIES
Beta-2 Glyco 1 IgA: <2 U/mL
Beta-2 Glyco 1 IgM: <2 U/mL
Beta-2 Glyco I IgG: <2 U/mL

## 2021-01-06 LAB — SEDIMENTATION RATE: Sed Rate: 11 mm/h (ref 0–30)

## 2021-01-06 LAB — CYCLIC CITRUL PEPTIDE ANTIBODY, IGG: Cyclic Citrullin Peptide Ab: <16 UNITS

## 2021-01-06 LAB — CRYOGLOBULIN: Cryoglobulin, Qualitative Analysis: NOT DETECTED

## 2021-01-08 NOTE — Progress Notes (Signed)
Lab results are all normal no particular evidence for autoimmune cause of vasculitis. Her xrays show chronic arthritis changes. I would like to follow up as planned to take another look but no specific treatment changes in the meantime.

## 2021-01-14 ENCOUNTER — Encounter: Payer: Self-pay | Admitting: Emergency Medicine

## 2021-01-14 ENCOUNTER — Other Ambulatory Visit: Payer: Self-pay

## 2021-01-14 ENCOUNTER — Ambulatory Visit
Admission: EM | Admit: 2021-01-14 | Discharge: 2021-01-14 | Disposition: A | Discharge status: Home / Self Care | Payer: 59 | Attending: Internal Medicine | Admitting: Internal Medicine

## 2021-01-14 ENCOUNTER — Ambulatory Visit (INDEPENDENT_AMBULATORY_CARE_PROVIDER_SITE_OTHER): Payer: 59

## 2021-01-14 ENCOUNTER — Ambulatory Visit: Payer: 59

## 2021-01-14 DIAGNOSIS — R0600 Dyspnea, unspecified: Secondary | ICD-10-CM

## 2021-01-14 DIAGNOSIS — R059 Cough, unspecified: Secondary | ICD-10-CM

## 2021-01-14 DIAGNOSIS — J209 Acute bronchitis, unspecified: Secondary | ICD-10-CM

## 2021-01-14 MED ORDER — PREDNISONE 20 MG PO TABS: 20.0000 mg | ORAL_TABLET | Freq: Every day | ORAL | 0 refills | Status: AC

## 2021-01-14 MED ORDER — BENZONATATE 100 MG PO CAPS: 100.0000 mg | ORAL_CAPSULE | Freq: Three times a day (TID) | ORAL | 0 refills | Status: DC

## 2021-01-14 MED ORDER — ONDANSETRON 4 MG PO TBDP: 4.0000 mg | ORAL_TABLET | Freq: Three times a day (TID) | ORAL | 0 refills | Status: DC | PRN

## 2021-01-14 NOTE — ED Provider Notes (Signed)
[facility]    CSN: [id number] Arrival date & time: 01/14/21  1149      History   Chief Complaint No chief complaint on file.   HPI Mallory Sutton is a 60 y.o. female comes to the urgent care with 1 week history of cough, runny nose and chest congestion.  Patient's symptoms started insidiously and has been persistent.  No febrile episodes.  No nausea, vomiting or diarrhea.  Patient has some wheezing associated with the coughing.  No dizziness.  Patient complains of sharp chest pain associated with coughing.  Pain is mainly in the sides of the chest. HPI  Past Medical History:  Diagnosis Date   Aortic stenosis    ATRIAL ARRHYTHMIAS    Bicuspid aortic valve    CHF (congestive heart failure) (HCC)    COARCTATION OF AORTA    ENDOMETRIOSIS    Heart murmur    Paroxysmal atrial fibrillation (HCC)    Shortness of breath    THORACIC AORTIC ANEURYSM     Patient Active Problem List   Diagnosis Date Noted   Vasculitis ([place]) 12/31/2020   Bilateral hand pain 12/31/2020   Benign skin lesion of thigh 10/15/2020   Atrial flutter (HCC)    Acute diastolic heart failure ([place]) 08/23/2014   Pleural effusion 08/23/2014   Pleural effusion, bilateral 08/23/2014   Dyspnea    Acute respiratory failure ([place]) 08/22/2014   Atrial fibrillation with RVR ([place]) 08/06/2014   Encounter for therapeutic drug monitoring 08/06/2014   S/P aortic valve replacement[facility]07/25/14 at [facility] 08/06/2014   Shortness of breath 04/23/2010   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 07/25/14 [facility] 03/12/2010   Aortic valve disorder 09/12/2009   ENDOMETRIOSIS 09/12/2009   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 09/12/2009   Paroxysmal atrial fibrillation ([place]) 07/18/2008   BICUSPID AORTIC VALVE 07/18/2008    Past Surgical History:  Procedure Laterality Date   CARDIOVERSION N/A 08/29/2019   Procedure: CARDIOVERSION;  Surgeon: Arnoldo Lenis, MD;  Location: [facility];  Service:  Endoscopy;  Laterality: N/A;   coarctation repair and residual restenosis     KNEE SURGERY     TEE WITHOUT CARDIOVERSION N/A 07/12/2019   Procedure: TRANSESOPHAGEAL ECHOCARDIOGRAM (TEE);  Surgeon: Sueanne Margarita, MD;  Location: [facility] ENDOSCOPY;  Service: Cardiovascular;  Laterality: N/A;   TEE WITHOUT CARDIOVERSION N/A 08/29/2019   Procedure: TRANSESOPHAGEAL ECHOCARDIOGRAM (TEE) WITH PROPOFOL;  Surgeon: Arnoldo Lenis, MD;  Location: [facility];  Service: Endoscopy;  Laterality: N/A;    OB History   No obstetric history on file.      Home Medications    Prior to Admission medications   Medication Sig Start Date End Date Taking? Authorizing Provider  benzonatate (TESSALON) 100 MG capsule Take 1 capsule (100 mg total) by mouth every 8 (eight) hours. 01/14/21  Yes Roderic Lammert, Myrene Galas, MD  ondansetron (ZOFRAN ODT) 4 MG disintegrating tablet Take 1 tablet (4 mg total) by mouth every 8 (eight) hours as needed for nausea or vomiting. 01/14/21  Yes Kemper Heupel, Myrene Galas, MD  predniSONE (DELTASONE) 20 MG tablet Take 1 tablet (20 mg total) by mouth daily for 5 days. 01/14/21 01/19/21 Yes Mahiya Kercheval, Myrene Galas, MD  aspirin EC 81 MG tablet Take 81 mg by mouth daily.    [provider]  diazepam (VALIUM) 5 MG tablet Take 1 tablet (5 mg total) by mouth daily as needed for anxiety. 08/10/19   Josue Hector, MD  furosemide (LASIX) 40  MG tablet Take 40 mg by mouth daily.    [provider]  gabapentin (NEURONTIN) 300 MG capsule Take 300 mg by mouth See admin instructions. Take 300 mg in the morning, 300 mg in the afternoon   600 mg at bedtime    [provider]  HYDROcodone-acetaminophen (NORCO/VICODIN) 5-325 MG tablet Take 2 tablets by mouth 2 (two) times daily. 10/31/20   [provider]  metoprolol succinate (TOPROL-XL) 25 MG 24 hr tablet TAKE 1 TABLET BY MOUTH ONCE DAILY. 10/29/20   Evans Lance, MD  Multiple Vitamin (MULTI-VITAMINS) TABS Take 1 tablet by mouth daily.     [provider]  potassium chloride SA (KLOR-CON) 20 MEQ tablet TAKE (1) TABLET BY MOUTH ONCE DAILY. 12/03/20   Josue Hector, MD  propafenone (RYTHMOL) 300 MG tablet TAKE 1 TABLET BY MOUTH THREE TIMES A DAY. 11/08/20   Josue Hector, MD  traMADol (ULTRAM) 50 MG tablet Take 1 tablet (50 mg total) by mouth 3 (three) times daily. 07/20/17   Josue Hector, MD  triamcinolone cream (KENALOG) 0.1 % Apply 1 application topically daily as needed (Vasculitis).     [provider]  Turmeric 500 MG CAPS Take 500 mg by mouth 3 (three) times a week.    [provider]  warfarin (COUMADIN) 1 MG tablet TAKE 3 TABLETS DAILY, OR AS DIRECTED. 07/17/20   Josue Hector, MD    Family History Family History  Problem Relation Age of Onset   Suicidality Mother    Mental illness Mother    Drug abuse Brother    Healthy Daughter    Healthy Son     Social History Social History   Tobacco Use   Smoking status: Never   Smokeless tobacco: Never  Vaping Use   Vaping Use: Never used  Substance Use Topics   Alcohol use: Yes    Alcohol/week: 0.0 standard drinks    Comment: 3 beers 3 nights a week   Drug use: No     Allergies   Amiodarone and Penicillins   Review of Systems Review of Systems  Constitutional: Negative.   HENT:  Positive for congestion. Negative for rhinorrhea, sinus pressure, sinus pain and sore throat.   Respiratory:  Positive for cough, chest tightness and shortness of breath. Negative for wheezing.   Musculoskeletal: Negative.   Neurological: Negative.     Physical Exam Triage Vital Signs ED Triage Vitals [01/14/21 1208]  Enc Vitals Group     BP (!) 156/87     Pulse Rate 66     Resp 16     Temp 97.8 F (36.6 C)     Temp Source Oral     SpO2 95 %     Weight      Height      Head Circumference      Peak Flow      Pain Score 4     Pain Loc      Pain Edu?      Excl. in [place]?    No data found.  Updated Vital Signs BP (!) 156/87 (BP Location: Right  Arm)   Pulse 66   Temp 97.8 F (36.6 C) (Oral)   Resp 16   LMP 07/16/2013   SpO2 95%   Visual Acuity Right Eye Distance:   Left Eye Distance:   Bilateral Distance:    Right Eye Near:   Left Eye Near:    Bilateral Near:  Physical Exam Vitals and nursing note reviewed.  Cardiovascular:     Rate and Rhythm: Normal rate and regular rhythm.  Pulmonary:     Effort: Pulmonary effort is normal. No respiratory distress.     Breath sounds: Normal breath sounds. No wheezing or rhonchi.  Abdominal:     General: Bowel sounds are normal.     Palpations: Abdomen is soft.  Neurological:     Mental Status: She is alert.     UC Treatments / Results  Labs (all labs ordered are listed, but only abnormal results are displayed) Labs Reviewed - No data to display  EKG   Radiology DG Chest 2 View  Result Date: 01/14/2021 CLINICAL DATA:  Cough, dyspnea EXAM: CHEST - 2 VIEW COMPARISON:  01/23/2015 FINDINGS: The lungs are mildly hyperinflated in keeping with changes of underlying COPD. Mild asymmetric bilateral perihilar bronchitic changes have developed with interstitial thickening noted. No confluent pulmonary infiltrate. No pneumothorax or pleural effusion. Aortic valve replacement and left atrial clipping has been performed. Cardiac size within normal limits. Pulmonary vascularity is normal. Surgical clips again noted within the right axilla. No acute bone abnormality. Stable L2 compression deformity with resultant accentuated thoracolumbar kyphosis. IMPRESSION: Interval development of mild bilateral bronchitic change with progressive pulmonary hyperinflation, in keeping with associated air trapping. No confluent pulmonary infiltrate. Electronically Signed   By: Fidela Salisbury M.D.   On: 01/14/2021 13:41    Procedures Procedures (including critical care time)  Medications Ordered in UC Medications - No data to display  Initial Impression / Assessment and Plan / UC Course  I have  reviewed the triage vital signs and the nursing notes.  Pertinent labs & imaging results that were available during my care of the patient were reviewed by me and considered in my medical decision making (see chart for details).     Acute bronchitis: Chest x-ray is consistent with mild bronchitic changes: I offered albuterol inhaler but the patient does not have a good experience with that in the past so she did not want to use that Prednisone 20 mg daily for the next 5 days Tessalon Perles 100 mg every 8 hours as needed for cough Zofran as needed for nausea Return to urgent care if symptoms worsen. Final Clinical Impressions(s) / UC Diagnoses   Final diagnoses:  Acute bronchitis, unspecified organism     Discharge Instructions      Please take medications as prescribed If you have worsening symptoms please return to urgent care to be reevaluated. Your chest x-ray is negative for pneumonia.   ED Prescriptions     Medication Sig Dispense Auth. Provider   benzonatate (TESSALON) 100 MG capsule Take 1 capsule (100 mg total) by mouth every 8 (eight) hours. 21 capsule Shaneeka Scarboro, Myrene Galas, MD   ondansetron (ZOFRAN ODT) 4 MG disintegrating tablet Take 1 tablet (4 mg total) by mouth every 8 (eight) hours as needed for nausea or vomiting. 20 tablet Viona Hosking, Myrene Galas, MD   predniSONE (DELTASONE) 20 MG tablet Take 1 tablet (20 mg total) by mouth daily for 5 days. 5 tablet Lolamae Voisin, Myrene Galas, MD      PDMP not reviewed this encounter.   Chase Picket, MD 01/14/21 1400

## 2021-01-14 NOTE — Discharge Instructions (Signed)
Please take medications as prescribed If you have worsening symptoms please return to urgent care to be reevaluated. Your chest x-ray is negative for pneumonia.

## 2021-01-14 NOTE — ED Triage Notes (Signed)
Runny nose, cough and chest congestion x 1  week.  States sides hurt from coughing.

## 2021-01-19 NOTE — Progress Notes (Deleted)
Office Visit Note  Patient: Mallory Sutton             Date of Birth: [birth year]           MRN: [id number]             PCP: Celene Squibb, MD Referring: Celene Squibb, MD Visit Date: 01/20/2021   Subjective:  No chief complaint on file.   History of Present Illness: Mallory Sutton is a 60 y.o. female here for follow up for vasculitis lab workup at initial visit was unremarkable for evidence of underlying inflammatory process.***     No Rheumatology ROS completed.   PMFS History:  Patient Active Problem List   Diagnosis Date Noted   Vasculitis ([employer]) 12/31/2020   Bilateral hand pain 12/31/2020   Benign skin lesion of thigh 10/15/2020   Atrial flutter (HCC)    Acute diastolic heart failure ([place]) 08/23/2014   Pleural effusion 08/23/2014   Pleural effusion, bilateral 08/23/2014   Dyspnea    Acute respiratory failure ([place]) 08/22/2014   Atrial fibrillation with RVR ([place]) 08/06/2014   Encounter for therapeutic drug monitoring 08/06/2014   S/P aortic valve [facility]-07/25/14 [facility] 08/06/2014   Shortness of breath 04/23/2010   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 07/25/14 [facility] 03/12/2010   Aortic valve disorder 09/12/2009   ENDOMETRIOSIS 09/12/2009   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 09/12/2009   Paroxysmal atrial fibrillation ([place]) 07/18/2008   BICUSPID AORTIC VALVE 07/18/2008    Past Medical History:  Diagnosis Date   Aortic stenosis    ATRIAL ARRHYTHMIAS    Bicuspid aortic valve    CHF (congestive heart failure) (HCC)    COARCTATION OF AORTA    ENDOMETRIOSIS    Heart murmur    Paroxysmal atrial fibrillation (HCC)    Shortness of breath    THORACIC AORTIC ANEURYSM     Family History  Problem Relation Age of Onset   Suicidality Mother    Mental illness Mother    Drug abuse Brother    Healthy Daughter    Healthy Son    Past Surgical History:  Procedure Laterality Date   CARDIOVERSION N/A 08/29/2019   Procedure:  CARDIOVERSION;  Surgeon: Arnoldo Lenis, MD;  Location: [facility];  Service: Endoscopy;  Laterality: N/A;   coarctation repair and residual restenosis     KNEE SURGERY     TEE WITHOUT CARDIOVERSION N/A 07/12/2019   Procedure: TRANSESOPHAGEAL ECHOCARDIOGRAM (TEE);  Surgeon: Sueanne Margarita, MD;  Location: [facility] ENDOSCOPY;  Service: Cardiovascular;  Laterality: N/A;   TEE WITHOUT CARDIOVERSION N/A 08/29/2019   Procedure: TRANSESOPHAGEAL ECHOCARDIOGRAM (TEE) WITH PROPOFOL;  Surgeon: Arnoldo Lenis, MD;  Location: [facility];  Service: Endoscopy;  Laterality: N/A;   Social History   Social History Narrative   Not on file   Immunization History  Administered Date(s) Administered   Influenza,inj,Quad PF,6+ Mos 03/26/2014, 02/18/2015, 02/10/2016, 03/15/2017, 03/06/2019, 02/01/2020     Objective: Vital Signs: LMP 07/16/2013    Physical Exam   Musculoskeletal Exam: ***  CDAI Exam: CDAI Score: -- Patient Global: --; Provider Global: -- Swollen: --; Tender: -- Joint Exam 01/20/2021   No joint exam has been documented for this visit   There is currently no information documented on the homunculus. Go to the Rheumatology activity and complete the homunculus joint exam.  Investigation: No additional findings.  Imaging: DG Chest 2 View  Result Date: 01/14/2021 CLINICAL DATA:  Cough, dyspnea  EXAM: CHEST - 2 VIEW COMPARISON:  01/23/2015 FINDINGS: The lungs are mildly hyperinflated in keeping with changes of underlying COPD. Mild asymmetric bilateral perihilar bronchitic changes have developed with interstitial thickening noted. No confluent pulmonary infiltrate. No pneumothorax or pleural effusion. Aortic valve replacement and left atrial clipping has been performed. Cardiac size within normal limits. Pulmonary vascularity is normal. Surgical clips again noted within the right axilla. No acute bone abnormality. Stable L2 compression deformity with resultant accentuated thoracolumbar kyphosis.  IMPRESSION: Interval development of mild bilateral bronchitic change with progressive pulmonary hyperinflation, in keeping with associated air trapping. No confluent pulmonary infiltrate. Electronically Signed   By: Fidela Salisbury M.D.   On: 01/14/2021 13:41   XR Hand 2 View Left  Result Date: 01/07/2021 Xray left hand 2 views Radiocarpal joint appears normal. Moderate 1st CMC joint degenerative arthritis. Cystic changes of metacarpal heads with MCP joint space preserved with no subluxation or deviation. PIP and DIP joint spaces appear preserved. 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 Moderate 1st CMC joint arthritis with cystic change in base of 1st metacarpal. Subluxation of 2nd and 3rd MCPs and lateral deviation of MCP joints. Cystic changes and small osteophytes no erosions seen. PIP and DIP joints appear normal. Bone mineralization possible generalized osteopenia. Impression Advanced degenerative changes with MCP subluxation and deviation suggestive for secondary or inflammatory process, distal joints spared, no erosive disease   Recent Labs: Lab Results  Component Value Date   WBC 8.5 08/18/2019   HGB 13.3 08/18/2019   PLT 428 (H) 08/18/2019   NA 137 08/25/2019   K 3.2 (L) 08/25/2019   CL 99 08/25/2019   CO2 29 08/25/2019   GLUCOSE 105 (H) 08/25/2019   BUN 13 08/25/2019   CREATININE 0.88 08/25/2019   BILITOT 0.8 07/20/2017   ALKPHOS 99 07/20/2017   AST 19 07/20/2017   ALT 14 07/20/2017   PROT 8.3 (H) 07/20/2017   ALBUMIN 3.8 07/20/2017   CALCIUM 8.5 (L) 08/25/2019   GFRAA >60 08/25/2019    Speciality Comments: No specialty comments available.  Procedures:  No procedures performed Allergies: Amiodarone and Penicillins   Assessment / Plan:     Visit Diagnoses: No diagnosis found.  ***  Orders: No  orders of the defined types were placed in this encounter.  No orders of the defined types were placed in this encounter.    Follow-Up Instructions: No follow-ups on file.   Collier Salina, MD  Note - This record has been created using [employer].  Chart creation errors have been sought, but may not always  have been located. Such creation errors do not reflect on  the standard of medical care.

## 2021-01-20 ENCOUNTER — Ambulatory Visit: Payer: 59 | Admitting: Internal Medicine

## 2021-01-22 ENCOUNTER — Other Ambulatory Visit: Payer: Self-pay | Admitting: Internal Medicine

## 2021-01-23 NOTE — Progress Notes (Signed)
Patient ID: Mallory Sutton, female   DOB: Apr 26, 1961, 60 y.o.   MRN: [id number]      60 y.o. long term patient followed since 1999. She has had congenital heart disease with coarctation repair at age 71 via left thoracotomy. Known persistent left sided SVC  Followed for long time for bicuspid AV and aortic aneurysm Finally had AVR and arch repair by [name] in 2016. I was upset that he did not bypass her coarctation at the time She had PAF and MAZE with LAA occlusion. She has been stable on propafenone for years and followed by Dr Lovena Le in [place]  She developed a neuropathy and painful ? Vascular rash in her legs 2019 with short course of steroids.   Recent US RLE with complex cystic lesion in posterior upper left thigh   TEE done 08/29/19 showed normal EF she had no AR and mean gradient across her AVR 14 mmHg. She has not had recent MRI/CT imaging of her coarctation but the MLD had been < 1 cm in past     Her care has been compromised in past due to lack of insurance and financial issues She has been sporadic at times with her INR checks being subRx the last month   Vasculitis does not seem well Rx Still with lots of discoloration and some pain in legs Also developing more cysts She needs to have on removed by Dr Constance Haw from back of right thigh as it is pressing on nerve Also has one above right knee with lateral knee effusion Had steroid injection right knee 11/01/20 SEen by rheumatology DR Benjamine Mola 12/31/20  Labs including ESR, ANCA Cryoglobulin and cardiolipin negative   ROS: Denies fever, malais, weight loss, blurry vision, decreased visual acuity, cough, sputum, SOB, hemoptysis, pleuritic pain, palpitaitons, heartburn, abdominal pain, melena, lower extremity edema, claudication, or rash.  All other systems reviewed and negative  General: Vitals:   01/27/21 1054  SpO2: 94%   Ht _0  (1.6 m)   Wt 44.5 kg   LMP 07/16/2013   SpO2 94%   BMI 17.36 kg/m    Affect appropriate Healthy:  appears  stated age 24: normal Neck supple with no adenopathy JVP normal no bruits no thyromegaly Lungs clear with no wheezing and good diaphragmatic motion Heart:  V3/X1 click SEM through AVR  No AR  murmur, no rub, gallop or click PMI normal Post left thoracotomy and sternotomy. Coarctation murmur radiates to back  Abdomen: benighn, BS positve, no tenderness, no AAA no bruit.  No HSM or HJR Distal pulses intact with no bruits No edema Neuro non-focal Skin vasculitic rash arms and legs  Cystic mass back of right thigh With right knee effusion and cyst above knee  Post left knee surgery at age 40 not replacement    Current Outpatient Medications  Medication Sig Dispense Refill   aspirin EC 81 MG tablet Take 81 mg by mouth daily.     diazepam (VALIUM) 5 MG tablet Take 1 tablet (5 mg total) by mouth daily as needed for anxiety. 30 tablet 0   furosemide (LASIX) 40 MG tablet Take 40 mg by mouth daily.     gabapentin (NEURONTIN) 300 MG capsule Take 300 mg by mouth See admin instructions. Take 300 mg in the morning, 300 mg in the afternoon   600 mg at bedtime     HYDROcodone-acetaminophen (NORCO/VICODIN) 5-325 MG tablet Take 2 tablets by mouth 2 (two) times daily.     metoprolol succinate (TOPROL-XL) 25  MG 24 hr tablet TAKE 1 TABLET BY MOUTH ONCE DAILY. 90 tablet 0   Multiple Vitamin (MULTI-VITAMINS) TABS Take 1 tablet by mouth daily.      ondansetron (ZOFRAN ODT) 4 MG disintegrating tablet Take 1 tablet (4 mg total) by mouth every 8 (eight) hours as needed for nausea or vomiting. 20 tablet 0   potassium chloride SA (KLOR-CON) 20 MEQ tablet TAKE (1) TABLET BY MOUTH ONCE DAILY. 90 tablet 1   propafenone (RYTHMOL) 300 MG tablet TAKE 1 TABLET BY MOUTH THREE TIMES A DAY. 90 tablet 3   traMADol (ULTRAM) 50 MG tablet Take 1 tablet (50 mg total) by mouth 3 (three) times daily. 90 tablet 3   triamcinolone cream (KENALOG) 0.1 % Apply 1 application topically daily as needed (Vasculitis).      Turmeric 500 MG  CAPS Take 500 mg by mouth 3 (three) times a week.     warfarin (COUMADIN) 1 MG tablet TAKE 3 TABLETS DAILY, OR AS DIRECTED. 100 tablet 3   No current facility-administered medications for this visit.    Allergies  Amiodarone and Penicillins  Electrocardiogram:  5/15  SR low voltage ? Old IMI   08/22/14  SR rate 89  Low voltage  Nonspecific ST changes 10/21/17 SR rate 52 LAFB no acute changes  Assessment and Plan  PAF:  2016 post MAZE/LAA exclusion stable on propafenone and anticoagulation  AVR: Normal function on TEE 08/29/19   Coarctation:  Disagree with decision make to not repair coarctation at time of surgery for aneurysm and bicuspid valve at some point will need f/u MRI/MRA  HTN:  Well controlled.  Continue current medications and low sodium Dash type diet.   Aneursym:  In setting of bicuspid valve post  arch repair with graft.  Neuropathy/Rash:  Bilateral painful legs. F/U Rheumatology as her vasculitis may not be ideally Rx She only takes pulsed steroids. She is afraid to take MTX.  Preoperative:  Ok to hold coumadin for 2 days prior to surgery Her INR have been low anyway  Prefer to have coumadin resumed the evening of or one day post operatively if possible   F/U with me in [place]

## 2021-01-27 ENCOUNTER — Other Ambulatory Visit: Payer: Self-pay

## 2021-01-27 ENCOUNTER — Ambulatory Visit (INDEPENDENT_AMBULATORY_CARE_PROVIDER_SITE_OTHER): Payer: 59 | Admitting: Cardiovascular Disease

## 2021-01-27 ENCOUNTER — Encounter: Payer: Self-pay | Admitting: Cardiovascular Disease

## 2021-01-27 VITALS — BP 130/80 | Ht 63.0 in | Wt 98.0 lb

## 2021-01-27 DIAGNOSIS — Z952 Presence of prosthetic heart valve: Secondary | ICD-10-CM | POA: Diagnosis not present

## 2021-01-27 DIAGNOSIS — I776 Arteritis, unspecified: Secondary | ICD-10-CM | POA: Diagnosis not present

## 2021-01-27 DIAGNOSIS — Q251 Coarctation of aorta: Secondary | ICD-10-CM | POA: Diagnosis not present

## 2021-01-27 DIAGNOSIS — I4892 Unspecified atrial flutter: Secondary | ICD-10-CM | POA: Diagnosis not present

## 2021-01-27 DIAGNOSIS — Z5181 Encounter for therapeutic drug level monitoring: Secondary | ICD-10-CM

## 2021-01-27 MED ORDER — BENZONATATE 100 MG PO CAPS: 100.0000 mg | ORAL_CAPSULE | Freq: Three times a day (TID) | ORAL | 1 refills | Status: DC | PRN

## 2021-01-27 NOTE — Patient Instructions (Addendum)
Medication Instructions:   Tessalon Perles 100 mg every 8 hours as needed for cough.  *If you need a refill on your cardiac medications before your next appointment, please call your pharmacy*   Lab Work: NONE  If you have labs (blood work) drawn today and your tests are completely normal, you will receive your results only by: [place] (if you have MyChart) OR A paper copy in the mail If you have any lab test that is abnormal or we need to change your treatment, we will call you to review the results.   Testing/Procedures: NONE    Follow-Up: At [facility], you and your health needs are our priority.  As part of our continuing mission to provide you with exceptional heart care, we have created designated Provider Care Teams.  These Care Teams include your primary Cardiologist (physician) and Advanced Practice Providers (APPs -  Physician Assistants and Nurse Practitioners) who all work together to provide you with the care you need, when you need it.  We recommend signing up for the patient portal called "MyChart".  Sign up information is provided on this After Visit Summary.  MyChart is used to connect with patients for Virtual Visits (Telemedicine).  Patients are able to view lab/test results, encounter notes, upcoming appointments, etc.  Non-urgent messages can be sent to your provider as well.   To learn more about what you can do with MyChart, go to [email or web address].    Your next appointment:   6 month(s)  The format for your next appointment:   In Person  Provider:   Jenkins Rouge, MD   Other Instructions Thank you for choosing [facility]!

## 2021-03-03 ENCOUNTER — Other Ambulatory Visit: Payer: Self-pay | Admitting: Cardiovascular Disease

## 2021-03-03 NOTE — Telephone Encounter (Signed)
Prescription refill request received for warfarin Lov: 01/27/2021 Next INR check: 8/22 Warfarin tablet strength: 2mg    Pt is overdue for INR check. Scheduled to come in on 11/7 to have INR checked.

## 2021-03-10 ENCOUNTER — Ambulatory Visit (INDEPENDENT_AMBULATORY_CARE_PROVIDER_SITE_OTHER): Payer: 59 | Admitting: *Deleted

## 2021-03-10 DIAGNOSIS — Z5181 Encounter for therapeutic drug level monitoring: Secondary | ICD-10-CM

## 2021-03-10 DIAGNOSIS — Q231 Congenital insufficiency of aortic valve: Secondary | ICD-10-CM | POA: Diagnosis not present

## 2021-03-10 DIAGNOSIS — I4892 Unspecified atrial flutter: Secondary | ICD-10-CM

## 2021-03-10 LAB — POCT INR: INR: 2.4 (ref 2.0–3.0)

## 2021-03-10 NOTE — Patient Instructions (Signed)
Continue to take warfarin 2 tablets daily.  Recheck in 4 weeks

## 2021-03-25 ENCOUNTER — Encounter: Payer: Self-pay | Admitting: Cardiovascular Disease

## 2021-03-28 ENCOUNTER — Other Ambulatory Visit: Payer: Self-pay | Admitting: Cardiovascular Disease

## 2021-04-21 ENCOUNTER — Other Ambulatory Visit: Payer: Self-pay | Admitting: Cardiovascular Disease

## 2021-04-29 ENCOUNTER — Other Ambulatory Visit: Payer: Self-pay | Admitting: Internal Medicine

## 2021-07-01 ENCOUNTER — Ambulatory Visit (INDEPENDENT_AMBULATORY_CARE_PROVIDER_SITE_OTHER): Payer: Self-pay | Admitting: *Deleted

## 2021-07-01 DIAGNOSIS — Z952 Presence of prosthetic heart valve: Secondary | ICD-10-CM

## 2021-07-01 DIAGNOSIS — Z5181 Encounter for therapeutic drug level monitoring: Secondary | ICD-10-CM

## 2021-07-01 DIAGNOSIS — Q231 Congenital insufficiency of aortic valve: Secondary | ICD-10-CM

## 2021-07-01 DIAGNOSIS — I4892 Unspecified atrial flutter: Secondary | ICD-10-CM

## 2021-07-01 LAB — POCT INR: INR: 3.2 — AB (ref 2.0–3.0)

## 2021-07-01 NOTE — Patient Instructions (Signed)
Hold warfarin tonight then resume 2 tablets daily.  Recheck in 4 weeks

## 2021-07-16 ENCOUNTER — Emergency Department (HOSPITAL_COMMUNITY): Payer: 59

## 2021-07-16 ENCOUNTER — Inpatient Hospital Stay (HOSPITAL_COMMUNITY)
Admission: EM | Admit: 2021-07-16 | Discharge: 2021-07-22 | DRG: 190 | Disposition: A | Discharge status: Home / Self Care | Payer: 59 | Attending: Internal Medicine | Admitting: Internal Medicine

## 2021-07-16 ENCOUNTER — Encounter (HOSPITAL_COMMUNITY): Payer: Self-pay

## 2021-07-16 ENCOUNTER — Other Ambulatory Visit: Payer: Self-pay

## 2021-07-16 DIAGNOSIS — J441 Chronic obstructive pulmonary disease with (acute) exacerbation: Secondary | ICD-10-CM | POA: Diagnosis present

## 2021-07-16 DIAGNOSIS — Z79899 Other long term (current) drug therapy: Secondary | ICD-10-CM | POA: Diagnosis not present

## 2021-07-16 DIAGNOSIS — E878 Other disorders of electrolyte and fluid balance, not elsewhere classified: Secondary | ICD-10-CM | POA: Diagnosis present

## 2021-07-16 DIAGNOSIS — Q231 Congenital insufficiency of aortic valve: Secondary | ICD-10-CM

## 2021-07-16 DIAGNOSIS — J9601 Acute respiratory failure with hypoxia: Secondary | ICD-10-CM | POA: Diagnosis present

## 2021-07-16 DIAGNOSIS — E876 Hypokalemia: Secondary | ICD-10-CM | POA: Diagnosis present

## 2021-07-16 DIAGNOSIS — Z818 Family history of other mental and behavioral disorders: Secondary | ICD-10-CM

## 2021-07-16 DIAGNOSIS — N809 Endometriosis, unspecified: Secondary | ICD-10-CM | POA: Diagnosis present

## 2021-07-16 DIAGNOSIS — I712 Thoracic aortic aneurysm, without rupture, unspecified: Secondary | ICD-10-CM | POA: Diagnosis present

## 2021-07-16 DIAGNOSIS — Z20822 Contact with and (suspected) exposure to covid-19: Secondary | ICD-10-CM | POA: Diagnosis present

## 2021-07-16 DIAGNOSIS — E440 Moderate protein-calorie malnutrition: Secondary | ICD-10-CM | POA: Diagnosis present

## 2021-07-16 DIAGNOSIS — I5032 Chronic diastolic (congestive) heart failure: Secondary | ICD-10-CM

## 2021-07-16 DIAGNOSIS — F419 Anxiety disorder, unspecified: Secondary | ICD-10-CM | POA: Diagnosis present

## 2021-07-16 DIAGNOSIS — Z7901 Long term (current) use of anticoagulants: Secondary | ICD-10-CM

## 2021-07-16 DIAGNOSIS — I5031 Acute diastolic (congestive) heart failure: Secondary | ICD-10-CM | POA: Diagnosis not present

## 2021-07-16 DIAGNOSIS — Z681 Body mass index (BMI) 19 or less, adult: Secondary | ICD-10-CM | POA: Diagnosis not present

## 2021-07-16 DIAGNOSIS — E43 Unspecified severe protein-calorie malnutrition: Secondary | ICD-10-CM | POA: Insufficient documentation

## 2021-07-16 DIAGNOSIS — J449 Chronic obstructive pulmonary disease, unspecified: Secondary | ICD-10-CM | POA: Diagnosis present

## 2021-07-16 DIAGNOSIS — I5033 Acute on chronic diastolic (congestive) heart failure: Secondary | ICD-10-CM | POA: Diagnosis present

## 2021-07-16 DIAGNOSIS — Z7982 Long term (current) use of aspirin: Secondary | ICD-10-CM | POA: Diagnosis not present

## 2021-07-16 DIAGNOSIS — J9801 Acute bronchospasm: Secondary | ICD-10-CM | POA: Diagnosis present

## 2021-07-16 DIAGNOSIS — Z888 Allergy status to other drugs, medicaments and biological substances status: Secondary | ICD-10-CM

## 2021-07-16 DIAGNOSIS — Q251 Coarctation of aorta: Secondary | ICD-10-CM | POA: Diagnosis not present

## 2021-07-16 DIAGNOSIS — F32A Depression, unspecified: Secondary | ICD-10-CM | POA: Diagnosis present

## 2021-07-16 DIAGNOSIS — Z880 Allergy status to penicillin: Secondary | ICD-10-CM | POA: Diagnosis not present

## 2021-07-16 DIAGNOSIS — I480 Paroxysmal atrial fibrillation: Secondary | ICD-10-CM | POA: Diagnosis present

## 2021-07-16 LAB — CBC WITH DIFFERENTIAL/PLATELET
Abs Immature Granulocytes: 0.33 10*3/uL — ABNORMAL HIGH (ref 0.00–0.07)
Basophils Absolute: 0.1 10*3/uL (ref 0.0–0.1)
Basophils Relative: 0 %
Eosinophils Absolute: 0 10*3/uL (ref 0.0–0.5)
Eosinophils Relative: 0 %
HCT: 32.3 % — ABNORMAL LOW (ref 36.0–46.0)
Hemoglobin: 10.2 g/dL — ABNORMAL LOW (ref 12.0–15.0)
Immature Granulocytes: 1 %
Lymphocytes Relative: 12 %
Lymphs Abs: 2.8 10*3/uL (ref 0.7–4.0)
MCH: 28.5 pg (ref 26.0–34.0)
MCHC: 31.6 g/dL (ref 30.0–36.0)
MCV: 90.2 fL (ref 80.0–100.0)
Monocytes Absolute: 1.7 10*3/uL — ABNORMAL HIGH (ref 0.1–1.0)
Monocytes Relative: 7 %
Neutro Abs: 19.1 10*3/uL — ABNORMAL HIGH (ref 1.7–7.7)
Neutrophils Relative %: 80 %
Platelets: 364 10*3/uL (ref 150–400)
RBC: 3.58 MIL/uL — ABNORMAL LOW (ref 3.87–5.11)
RDW: 15 % (ref 11.5–15.5)
WBC: 24 10*3/uL — ABNORMAL HIGH (ref 4.0–10.5)
nRBC: 0.2 % (ref 0.0–0.2)

## 2021-07-16 LAB — COMPREHENSIVE METABOLIC PANEL
ALT: 25 U/L (ref 0–44)
AST: 56 U/L — ABNORMAL HIGH (ref 15–41)
Albumin: 3.9 g/dL (ref 3.5–5.0)
Alkaline Phosphatase: 62 U/L (ref 38–126)
Anion gap: 13 (ref 5–15)
BUN: 29 mg/dL — ABNORMAL HIGH (ref 6–20)
CO2: 29 mmol/L (ref 22–32)
Calcium: 9 mg/dL (ref 8.9–10.3)
Chloride: 93 mmol/L — ABNORMAL LOW (ref 98–111)
Creatinine, Ser: 1.15 mg/dL — ABNORMAL HIGH (ref 0.44–1.00)
GFR, Estimated: 55 mL/min — ABNORMAL LOW (ref >60)
Glucose, Bld: 127 mg/dL — ABNORMAL HIGH (ref 70–99)
Potassium: 3.1 mmol/L — ABNORMAL LOW (ref 3.5–5.1)
Sodium: 135 mmol/L (ref 135–145)
Total Bilirubin: 0.8 mg/dL (ref 0.3–1.2)
Total Protein: 7.4 g/dL (ref 6.5–8.1)

## 2021-07-16 LAB — RESP PANEL BY RT-PCR (FLU A&B, COVID) ARPGX2
Influenza A by PCR: NEGATIVE
Influenza B by PCR: NEGATIVE
SARS Coronavirus 2 by RT PCR: NEGATIVE

## 2021-07-16 LAB — PROTIME-INR
INR: 4 — ABNORMAL HIGH (ref 0.8–1.2)
Prothrombin Time: 39.2 seconds — ABNORMAL HIGH (ref 11.4–15.2)

## 2021-07-16 LAB — BRAIN NATRIURETIC PEPTIDE: B Natriuretic Peptide: 1150 pg/mL — ABNORMAL HIGH (ref 0.0–100.0)

## 2021-07-16 MED ORDER — BEMPEDOIC ACID 180 MG PO TABS
1.0000 | ORAL_TABLET | Freq: Every day | ORAL | Status: DC
  Administered 2021-07-18 – 2021-07-21 (×4): 1 via ORAL
  Filled 2021-07-16 (×4): qty 1

## 2021-07-16 MED ORDER — PREDNISONE 20 MG PO TABS
40.0000 mg | ORAL_TABLET | Freq: Every day | ORAL | Status: DC
  Administered 2021-07-18: 40 mg via ORAL
  Filled 2021-07-16: qty 2

## 2021-07-16 MED ORDER — FUROSEMIDE 40 MG PO TABS: 80.0000 mg | ORAL_TABLET | Freq: Every day | ORAL | Status: DC

## 2021-07-16 MED ORDER — SERTRALINE HCL 50 MG PO TABS
100.0000 mg | ORAL_TABLET | Freq: Every day | ORAL | Status: DC
  Administered 2021-07-17 – 2021-07-22 (×6): 100 mg via ORAL
  Filled 2021-07-16 (×6): qty 2

## 2021-07-16 MED ORDER — HYDROCODONE-ACETAMINOPHEN 5-325 MG PO TABS
2.0000 | ORAL_TABLET | Freq: Two times a day (BID) | ORAL | Status: DC
  Administered 2021-07-16 – 2021-07-22 (×12): 2 via ORAL
  Filled 2021-07-16 (×12): qty 2

## 2021-07-16 MED ORDER — TURMERIC 500 MG PO CAPS: 500.0000 mg | ORAL_CAPSULE | ORAL | Status: DC

## 2021-07-16 MED ORDER — ONDANSETRON HCL 4 MG PO TABS: 4.0000 mg | ORAL_TABLET | Freq: Four times a day (QID) | ORAL | Status: DC | PRN

## 2021-07-16 MED ORDER — DIAZEPAM 5 MG PO TABS
10.0000 mg | ORAL_TABLET | Freq: Every day | ORAL | Status: DC | PRN
  Administered 2021-07-19 – 2021-07-21 (×3): 10 mg via ORAL
  Filled 2021-07-16 (×4): qty 2

## 2021-07-16 MED ORDER — GABAPENTIN 300 MG PO CAPS
600.0000 mg | ORAL_CAPSULE | Freq: Every day | ORAL | Status: DC
  Administered 2021-07-16 – 2021-07-21 (×6): 600 mg via ORAL
  Filled 2021-07-16 (×6): qty 2

## 2021-07-16 MED ORDER — PROPAFENONE HCL 150 MG PO TABS
300.0000 mg | ORAL_TABLET | Freq: Three times a day (TID) | ORAL | Status: DC
  Administered 2021-07-17: 300 mg via ORAL
  Filled 2021-07-16 (×4): qty 2

## 2021-07-16 MED ORDER — METOPROLOL SUCCINATE ER 25 MG PO TB24
25.0000 mg | ORAL_TABLET | Freq: Every day | ORAL | Status: DC
  Administered 2021-07-17 – 2021-07-18 (×2): 25 mg via ORAL
  Filled 2021-07-16 (×2): qty 1

## 2021-07-16 MED ORDER — BENZONATATE 100 MG PO CAPS: 100.0000 mg | ORAL_CAPSULE | Freq: Three times a day (TID) | ORAL | Status: DC | PRN

## 2021-07-16 MED ORDER — SODIUM CHLORIDE 0.9 % IV SOLN
1.0000 g | INTRAVENOUS | Status: AC
  Administered 2021-07-16 – 2021-07-20 (×5): 1 g via INTRAVENOUS
  Filled 2021-07-16 (×5): qty 10

## 2021-07-16 MED ORDER — ONDANSETRON HCL 4 MG/2ML IJ SOLN
4.0000 mg | Freq: Four times a day (QID) | INTRAMUSCULAR | Status: DC | PRN
  Administered 2021-07-17: 4 mg via INTRAVENOUS
  Filled 2021-07-16 (×2): qty 2

## 2021-07-16 MED ORDER — FUROSEMIDE 10 MG/ML IJ SOLN
20.0000 mg | Freq: Once | INTRAMUSCULAR | Status: AC
  Administered 2021-07-16: 20 mg via INTRAVENOUS
  Filled 2021-07-16: qty 2

## 2021-07-16 MED ORDER — HYDROCOD POLI-CHLORPHE POLI ER 10-8 MG/5ML PO SUER
5.0000 mL | Freq: Two times a day (BID) | ORAL | Status: DC | PRN
  Administered 2021-07-18 – 2021-07-20 (×4): 5 mL via ORAL
  Filled 2021-07-16 (×5): qty 5

## 2021-07-16 MED ORDER — POTASSIUM CHLORIDE CRYS ER 20 MEQ PO TBCR
20.0000 meq | EXTENDED_RELEASE_TABLET | Freq: Every day | ORAL | Status: DC
  Filled 2021-07-16: qty 1

## 2021-07-16 MED ORDER — SODIUM CHLORIDE 0.9 % IV SOLN
500.0000 mg | Freq: Once | INTRAVENOUS | Status: AC
  Administered 2021-07-16: 500 mg via INTRAVENOUS
  Filled 2021-07-16: qty 5

## 2021-07-16 MED ORDER — METHYLPREDNISOLONE SODIUM SUCC 125 MG IJ SOLR
125.0000 mg | Freq: Once | INTRAMUSCULAR | Status: AC
  Administered 2021-07-16: 125 mg via INTRAVENOUS
  Filled 2021-07-16: qty 2

## 2021-07-16 MED ORDER — MAGNESIUM HYDROXIDE 400 MG/5ML PO SUSP: 30.0000 mL | Freq: Every day | ORAL | Status: DC | PRN

## 2021-07-16 MED ORDER — DIAZEPAM 5 MG PO TABS: 5.0000 mg | ORAL_TABLET | Freq: Every day | ORAL | Status: DC | PRN

## 2021-07-16 MED ORDER — ALBUTEROL (5 MG/ML) CONTINUOUS INHALATION SOLN
10.0000 mg/h | INHALATION_SOLUTION | RESPIRATORY_TRACT | Status: DC
  Filled 2021-07-16: qty 20

## 2021-07-16 MED ORDER — SODIUM CHLORIDE 0.9 % IV BOLUS
1000.0000 mL | Freq: Once | INTRAVENOUS | Status: AC
  Administered 2021-07-16: 1000 mL via INTRAVENOUS

## 2021-07-16 MED ORDER — ALBUTEROL SULFATE (2.5 MG/3ML) 0.083% IN NEBU
2.5000 mg | INHALATION_SOLUTION | Freq: Once | RESPIRATORY_TRACT | Status: AC
  Administered 2021-07-16: 2.5 mg via RESPIRATORY_TRACT
  Filled 2021-07-16: qty 3

## 2021-07-16 MED ORDER — ALBUTEROL SULFATE (2.5 MG/3ML) 0.083% IN NEBU
INHALATION_SOLUTION | RESPIRATORY_TRACT | Status: AC
  Administered 2021-07-16: 10 mg
  Filled 2021-07-16: qty 12

## 2021-07-16 MED ORDER — WARFARIN SODIUM 1 MG PO TABS: 1.0000 mg | ORAL_TABLET | ORAL | Status: DC

## 2021-07-16 MED ORDER — ACETAMINOPHEN 650 MG RE SUPP
650.0000 mg | Freq: Four times a day (QID) | RECTAL | Status: DC | PRN
Start: 2021-07-16 — End: 2021-07-22

## 2021-07-16 MED ORDER — GABAPENTIN 300 MG PO CAPS: 300.0000 mg | ORAL_CAPSULE | ORAL | Status: DC

## 2021-07-16 MED ORDER — GUAIFENESIN ER 600 MG PO TB12
600.0000 mg | ORAL_TABLET | Freq: Two times a day (BID) | ORAL | Status: DC
  Administered 2021-07-16 – 2021-07-18 (×4): 600 mg via ORAL
  Filled 2021-07-16 (×4): qty 1

## 2021-07-16 MED ORDER — METHYLPREDNISOLONE SODIUM SUCC 40 MG IJ SOLR
40.0000 mg | Freq: Two times a day (BID) | INTRAMUSCULAR | Status: AC
  Administered 2021-07-17 (×2): 40 mg via INTRAVENOUS
  Filled 2021-07-16 (×2): qty 1

## 2021-07-16 MED ORDER — ONDANSETRON 4 MG PO TBDP: 4.0000 mg | ORAL_TABLET | Freq: Three times a day (TID) | ORAL | Status: DC | PRN

## 2021-07-16 MED ORDER — FUROSEMIDE 10 MG/ML IJ SOLN
40.0000 mg | Freq: Two times a day (BID) | INTRAMUSCULAR | Status: DC
  Administered 2021-07-17 – 2021-07-18 (×3): 40 mg via INTRAVENOUS
  Filled 2021-07-16 (×3): qty 4

## 2021-07-16 MED ORDER — ACETAMINOPHEN 325 MG PO TABS: 650.0000 mg | ORAL_TABLET | Freq: Four times a day (QID) | ORAL | Status: DC | PRN

## 2021-07-16 MED ORDER — ASPIRIN EC 81 MG PO TBEC
81.0000 mg | DELAYED_RELEASE_TABLET | Freq: Every day | ORAL | Status: DC
  Administered 2021-07-17 – 2021-07-22 (×6): 81 mg via ORAL
  Filled 2021-07-16 (×6): qty 1

## 2021-07-16 MED ORDER — TRAZODONE HCL 50 MG PO TABS
25.0000 mg | ORAL_TABLET | Freq: Every evening | ORAL | Status: DC | PRN
  Administered 2021-07-18 – 2021-07-20 (×3): 25 mg via ORAL
  Filled 2021-07-16 (×3): qty 1

## 2021-07-16 MED ORDER — ADULT MULTIVITAMIN W/MINERALS CH
1.0000 | ORAL_TABLET | Freq: Every day | ORAL | Status: DC
  Administered 2021-07-17 – 2021-07-22 (×6): 1 via ORAL
  Filled 2021-07-16 (×6): qty 1

## 2021-07-16 MED ORDER — GABAPENTIN 300 MG PO CAPS
300.0000 mg | ORAL_CAPSULE | Freq: Two times a day (BID) | ORAL | Status: DC
  Administered 2021-07-17 – 2021-07-22 (×11): 300 mg via ORAL
  Filled 2021-07-16 (×11): qty 1

## 2021-07-16 MED ORDER — PROPAFENONE HCL 150 MG PO TABS
300.0000 mg | ORAL_TABLET | Freq: Once | ORAL | Status: AC
  Administered 2021-07-16: 300 mg via ORAL
  Filled 2021-07-16: qty 2

## 2021-07-16 NOTE — ED Notes (Signed)
Spoke with respiratory and advised that patient needed a breathing treatment ?

## 2021-07-16 NOTE — ED Notes (Signed)
This RN placed pt back on the [place]. Pt was bouncing from 87-91% on RA. Pt said she could tell a big difference when I took her off the oxygen. ?

## 2021-07-16 NOTE — ED Triage Notes (Signed)
Pt sent to ED by Dr Juel Burrow office for shortness of breath and O2 sats 66%. Pt states SOB and cough started Friday.  ?

## 2021-07-16 NOTE — ED Provider Notes (Signed)
?[facility] ?Provider Note ? ? ?CSN: [id number] ?Arrival date & time: 07/16/21  1615 ? ?  ? ?History ? ?Chief Complaint  ?Patient presents with  ? Shortness of Breath  ? ? ?Mallory Sutton is a 61 y.o. female. ? ?HPI ?Patient with multiple medical issues, large cardiac secondary to congenital cardiac disease presents with dyspnea, fatigue.  Symptoms been present for several days, no relief with OTC medication.  No fever, chest pain only with coughing.  There is associated anorexia, but no true abdominal pain. ?  ? ?Home Medications ?Prior to Admission medications   ?Medication Sig Start Date End Date Taking? Authorizing Provider  ?aspirin EC 81 MG tablet Take 81 mg by mouth daily.   Yes [provider]  ?benzonatate (TESSALON PERLES) 100 MG capsule Take 1 capsule (100 mg total) by mouth 3 (three) times daily as needed for cough. 01/27/21  Yes Josue Hector, MD  ?diazepam (VALIUM) 10 MG tablet Take 10 mg by mouth daily as needed. 07/04/21  Yes [provider]  ?furosemide (LASIX) 40 MG tablet Take 80 mg by mouth daily.   Yes [provider]  ?gabapentin (NEURONTIN) 300 MG capsule Take 300 mg by mouth See admin instructions. Take 300 mg in the morning, 300 mg in the afternoon   600 mg at bedtime   Yes [provider]  ?HYDROcodone-acetaminophen (NORCO/VICODIN) 5-325 MG tablet Take 2 tablets by mouth 2 (two) times daily. 10/31/20  Yes [provider]  ?metoprolol succinate (TOPROL-XL) 25 MG 24 hr tablet TAKE 1 TABLET BY MOUTH ONCE DAILY. 04/29/21  Yes Josue Hector, MD  ?Multiple Vitamin (MULTI-VITAMINS) TABS Take 1 tablet by mouth daily.    Yes [provider]  ?NEXLETOL 180 MG TABS Take 1 tablet by mouth daily. 04/03/21  Yes [provider]  ?ondansetron (ZOFRAN ODT) 4 MG disintegrating tablet Take 1 tablet (4 mg total) by mouth every 8 (eight) hours as needed for nausea or vomiting. 01/14/21  Yes Lamptey, Myrene Galas, MD  ?potassium chloride  SA (KLOR-CON) 20 MEQ tablet TAKE (1) TABLET BY MOUTH ONCE DAILY. ?Patient taking differently: Take 20 mEq by mouth daily. 12/03/20  Yes Josue Hector, MD  ?propafenone (RYTHMOL) 300 MG tablet TAKE 1 TABLET BY MOUTH THREE TIMES A DAY. 03/31/21  Yes Josue Hector, MD  ?sertraline (ZOLOFT) 100 MG tablet Take 100 mg by mouth daily. 06/27/21  Yes [provider]  ?Turmeric 500 MG CAPS Take 500 mg by mouth 3 (three) times a week.   Yes [provider]  ?warfarin (COUMADIN) 1 MG tablet TAKE 3 TABLETS DAILY, OR AS DIRECTED. ?Patient taking differently: TAKE 2 TABLETS DAILY, OR AS DIRECTED. 04/21/21  Yes Josue Hector, MD  ?diazepam (VALIUM) 5 MG tablet Take 1 tablet (5 mg total) by mouth daily as needed for anxiety. ?Patient not taking: Reported on 07/16/2021 08/10/19   Josue Hector, MD  ?traMADol (ULTRAM) 50 MG tablet Take 1 tablet (50 mg total) by mouth 3 (three) times daily. ?Patient not taking: Reported on 07/16/2021 07/20/17   Josue Hector, MD  ?   ? ?Allergies    ?Amiodarone and Penicillins   ? ?Review of Systems   ?Review of Systems  ?Constitutional:   ?     Per HPI, otherwise negative  ?HENT:    ?     Per HPI, otherwise negative  ?Respiratory:    ?     Per HPI, otherwise negative  ?Cardiovascular:   ?  Per HPI, otherwise negative  ?Gastrointestinal:  Negative for vomiting.  ?Endocrine:  ?     Negative aside from HPI  ?Genitourinary:   ?     Neg aside from HPI   ?Musculoskeletal:   ?     Per HPI, otherwise negative  ?Skin: Negative.   ?Neurological:  Negative for syncope.  ? ?Physical Exam ?Updated Vital Signs ?BP (!) 129/53   Pulse 60   Temp 97.9 ?F (36.6 ?C) (Oral)   Resp 18   Ht '5\' 4"'$  (1.626 m)   Wt 43.1 kg   LMP 07/16/2013   SpO2 99%   BMI 16.31 kg/m?  ?Physical Exam ?Vitals and nursing note reviewed.  ?Constitutional:   ?   General: She is not in acute distress. ?   Appearance: She is well-developed.  ?HENT:  ?   Head: Normocephalic and atraumatic.  ?Eyes:  ?    Conjunctiva/sclera: Conjunctivae normal.  ?Cardiovascular:  ?   Rate and Rhythm: Normal rate and regular rhythm.  ?Pulmonary:  ?   Effort: Pulmonary effort is normal. No respiratory distress.  ?   Breath sounds: No stridor. Decreased breath sounds present.  ?Abdominal:  ?   General: There is no distension.  ?Skin: ?   General: Skin is warm and dry.  ?Neurological:  ?   Mental Status: She is alert and oriented to person, place, and time.  ?   Cranial Nerves: No cranial nerve deficit.  ?Psychiatric:     ?   Mood and Affect: Mood normal.  ? ? ?ED Results / Procedures / Treatments   ?Labs ?(all labs ordered are listed, but only abnormal results are displayed) ?Labs Reviewed  ?COMPREHENSIVE METABOLIC PANEL - Abnormal; Notable for the following components:  ?    Result Value  ? Potassium 3.1 (*)   ? Chloride 93 (*)   ? Glucose, Bld 127 (*)   ? BUN 29 (*)   ? Creatinine, Ser 1.15 (*)   ? AST 56 (*)   ? GFR, Estimated 55 (*)   ? All other components within normal limits  ?CBC WITH DIFFERENTIAL/PLATELET - Abnormal; Notable for the following components:  ? WBC 24.0 (*)   ? RBC 3.58 (*)   ? Hemoglobin 10.2 (*)   ? HCT 32.3 (*)   ? Neutro Abs 19.1 (*)   ? Monocytes Absolute 1.7 (*)   ? Abs Immature Granulocytes 0.33 (*)   ? All other components within normal limits  ?BRAIN NATRIURETIC PEPTIDE - Abnormal; Notable for the following components:  ? B Natriuretic Peptide 1,150.0 (*)   ? All other components within normal limits  ?RESP PANEL BY RT-PCR (FLU A&B, COVID) ARPGX2  ? ? ?EKG ?EKG Interpretation ? ?Date/Time:  Wednesday July 16 2021 16:33:31 EDT ?Ventricular Rate:  67 ?PR Interval:  194 ?QRS Duration: 113 ?QT Interval:  417 ?QTC Calculation: 441 ?R Axis:   -73 ?Text Interpretation: Sinus rhythm Left anterior fascicular block LVH with secondary repolarization abnormality Anterior Q waves, possibly due to LVH Baseline wander Abnormal ECG Confirmed by Carmin Muskrat [phone]) on 07/16/2021 5:10:59 PM ? ?Radiology ?DG Chest Port  1 View ? ?Result Date: 07/16/2021 ?CLINICAL DATA:  Shortness of breath. EXAM: PORTABLE CHEST 1 VIEW COMPARISON:  01/14/2021 FINDINGS: 1721 hours. The cardio pericardial silhouette is enlarged. Lungs are hyperexpanded Interstitial markings are diffusely coarsened with chronic features. The visualized bony structures of the thorax are unremarkable. Telemetry leads overlie the chest. IMPRESSION: Emphysema without acute cardiopulmonary findings. Electronically Signed  By: Misty Stanley M.D.   On: 07/16/2021 17:31   ? ?Procedures ?Procedures  ? ? ?Medications Ordered in ED ?Medications  ?albuterol (PROVENTIL,VENTOLIN) solution continuous neb (has no administration in time range)  ?furosemide (LASIX) injection 20 mg (has no administration in time range)  ?albuterol (PROVENTIL) (2.5 MG/3ML) 0.083% nebulizer solution 2.5 mg (2.5 mg Nebulization Given 07/16/21 1808)  ?methylPREDNISolone sodium succinate (SOLU-MEDROL) 125 mg/2 mL injection 125 mg (125 mg Intravenous Given 07/16/21 1752)  ?sodium chloride 0.9 % bolus 1,000 mL (0 mLs Intravenous Stopped 07/16/21 2020)  ? ? ?ED Course/ Medical Decision Making/ A&P ?This patient presents to the ED for concern of dyspnea, this involves an extensive number of treatment options, and is a complaint that carries with it a high risk of complications and morbidity.  The differential diagnosis includes pneumonia, pneumothorax, bronchitis, CHF exacerbation, atypical ACS ? ? ?Co morbidities that complicate the patient evaluation ? ?Heart disease ? ?Social Determinants of Health: ? ?None ? ?Additional history obtained: ? ?Additional history and/or information obtained from chart review ?External records from outside source obtained and reviewed including cardiology notes with ongoing management for a flutter, history of congenital heart disease, episode of bronchitis 6 months ago ? ? ?After the initial evaluation, orders, including: Labs, x-ray were initiated. ? ? ?Patient placed on Cardiac  and Pulse-Oximetry Monitors. ?The patient was maintained on a cardiac monitor.  The cardiac monitored showed an rhythm of 70 sinus normal ?The patient was also maintained on pulse oximetry. The readings were typically 99% room a

## 2021-07-16 NOTE — H&P (Signed)
Home ?  ?  ?[place] ? ? ?PATIENT NAME: Mallory Sutton   ? ?MR#:  [id number] ? ?DATE OF BIRTH:  Sep 02, 1960 ? ?DATE OF ADMISSION:  07/16/2021 ? ?PRIMARY CARE PHYSICIAN: Mallory Squibb, MD  ? ?Patient is coming from: Home ? ?REQUESTING/REFERRING PHYSICIAN: Carmin Muskrat, MD ? ?CHIEF COMPLAINT:  ? ?Chief Complaint  ?Patient presents with  ?? Shortness of Breath  ? ? ?HISTORY OF PRESENT ILLNESS:  ?Mallory Sutton is a 61 y.o. Caucasian female with medical history significant for paroxysmal atrial fibrillation on Coumadin, anxiety and depression, thoracic aortic aneurysm, CHF and coarctation of the aorta, who presented to the emergency room with acute onset of worsening dyspnea with associated congested cough with inability to expectorate as well as wheezing since yesterday.  She admits to nausea without vomiting.  She denies any fever or chills.  She has been having chest pain only with cough.  No dysuria, oliguria or hematuria, urgency or frequency or flank pain.  No leg pain or edema or recent travels or surgeries. She denies any orthopnea or proximal nocturnal dyspnea or worsening lower extremity edema. ? ?ED Course: Upon presentation to the emergency room, vital signs were within normal except for oximetry of 85% on room air. Labs revealed hypokalemia of 3.1 and hypochloremia of 93 with a BUN of 29 and creatinine 1.15, ALT 56 and BNP was 1150.   ?EKG as reviewed by me : EKG showed normal sinus rhythm with a rate of 69 and PACs, left intrafascicular block and LVH with repolarization abnormality and anterior Q waves. ?Imaging: Portable chest ray showed emphysema with no cardiopulmonary disease. ? ?The patient was given 125 mg of IV Solu-Medrol, 1 L bolus of IV normal saline and nebulized albuterol.  She will be admitted to a medical telemetry bed for further evaluation and management. ?PAST MEDICAL HISTORY:  ? ?Past Medical History:  ?Diagnosis Date  ?? Aortic stenosis   ?? ATRIAL ARRHYTHMIAS   ?? Bicuspid aortic valve    ?? CHF (congestive heart failure) ([facility])   ?? COARCTATION OF AORTA   ?? ENDOMETRIOSIS   ?? Heart murmur   ?? Paroxysmal atrial fibrillation ([facility])   ?? Shortness of breath   ?? THORACIC AORTIC ANEURYSM   ? ? ?PAST SURGICAL HISTORY:  ? ?Past Surgical History:  ?Procedure Laterality Date  ?? CARDIOVERSION N/A 08/29/2019  ? Procedure: CARDIOVERSION;  Surgeon: Mallory Lenis, MD;  Location: [facility];  Service: Endoscopy;  Laterality: N/A;  ?? coarctation repair and residual restenosis    ?? KNEE SURGERY    ?? TEE WITHOUT CARDIOVERSION N/A 07/12/2019  ? Procedure: TRANSESOPHAGEAL ECHOCARDIOGRAM (TEE);  Surgeon: Mallory Margarita, MD;  Location: [facility];  Service: Cardiovascular;  Laterality: N/A;  ?? TEE WITHOUT CARDIOVERSION N/A 08/29/2019  ? Procedure: TRANSESOPHAGEAL ECHOCARDIOGRAM (TEE) WITH PROPOFOL;  Surgeon: Mallory Lenis, MD;  Location: [facility];  Service: Endoscopy;  Laterality: N/A;  ? ? ?SOCIAL HISTORY:  ? ?Social History  ? ?Tobacco Use  ?? Smoking status: Never  ?? Smokeless tobacco: Never  ?Substance Use Topics  ?? Alcohol use: Yes  ?  Alcohol/week: 0.0 standard drinks  ?  Comment: 3 beers 3 nights a week  ? ? ?FAMILY HISTORY:  ? ?Family History  ?Problem Relation Age of Onset  ?? Suicidality Mother   ?? Mental illness Mother   ?? Drug abuse Brother   ?? Healthy Daughter   ?? Healthy Son   ? ? ?DRUG ALLERGIES:  ? ?Allergies  ?  Allergen Reactions  ?? Amiodarone Nausea Only  ?? Penicillins Hives  ?  Has patient had a PCN reaction causing immediate rash, facial/tongue/throat swelling, SOB or lightheadedness with hypotension: YES ?Has patient had a PCN reaction causing severe rash involving mucus membranes or skin necrosis: NO ?Has patient had a PCN reaction that required hospitalization NO ?Has patient had a PCN reaction occurring within the last 10 years: NO ?If all of the above answers are "NO", then may proceed with Cephalosporin use. ?  ? ? ?REVIEW OF SYSTEMS:  ? ?ROS ?As per history of present illness.  All pertinent systems were reviewed above. Constitutional, HEENT, cardiovascular, respiratory, GI, GU, musculoskeletal, neuro, psychiatric, endocrine, integumentary and hematologic systems were reviewed and are otherwise negative/unremarkable except for positive findings mentioned above in the HPI. ? ? ?MEDICATIONS AT HOME:  ? ?Prior to Admission medications   ?Medication Sig Start Date End Date Taking? Authorizing Provider  ?aspirin EC 81 MG tablet Take 81 mg by mouth daily.   Yes [provider]  ?benzonatate (TESSALON PERLES) 100 MG capsule Take 1 capsule (100 mg total) by mouth 3 (three) times daily as needed for cough. 01/27/21  Yes Mallory Hector, MD  ?diazepam (VALIUM) 10 MG tablet Take 10 mg by mouth daily as needed. 07/04/21  Yes [provider]  ?furosemide (LASIX) 40 MG tablet Take 80 mg by mouth daily.   Yes [provider]  ?gabapentin (NEURONTIN) 300 MG capsule Take 300 mg by mouth See admin instructions. Take 300 mg in the morning, 300 mg in the afternoon   600 mg at bedtime   Yes [provider]  ?HYDROcodone-acetaminophen (NORCO/VICODIN) 5-325 MG tablet Take 2 tablets by mouth 2 (two) times daily. 10/31/20  Yes [provider]  ?metoprolol succinate (TOPROL-XL) 25 MG 24 hr tablet TAKE 1 TABLET BY MOUTH ONCE DAILY. 04/29/21  Yes Mallory Hector, MD  ?Multiple Vitamin (MULTI-VITAMINS) TABS Take 1 tablet by mouth daily.    Yes [provider]  ?NEXLETOL 180 MG TABS Take 1 tablet by mouth daily. 04/03/21  Yes [provider]  ?ondansetron (ZOFRAN ODT) 4 MG disintegrating tablet Take 1 tablet (4 mg total) by mouth every 8 (eight) hours as needed for nausea or vomiting. 01/14/21  Yes Mallory Sutton, Mallory Galas, MD  ?potassium chloride SA (KLOR-CON) 20 MEQ tablet TAKE (1) TABLET BY MOUTH ONCE DAILY. ?Patient taking differently: Take 20 mEq by mouth daily. 12/03/20  Yes Mallory Hector, MD  ?propafenone (RYTHMOL) 300 MG tablet TAKE 1 TABLET BY MOUTH THREE TIMES  A DAY. 03/31/21  Yes Mallory Hector, MD  ?sertraline (ZOLOFT) 100 MG tablet Take 100 mg by mouth daily. 06/27/21  Yes [provider]  ?Turmeric 500 MG CAPS Take 500 mg by mouth 3 (three) times a week.   Yes [provider]  ?warfarin (COUMADIN) 1 MG tablet TAKE 3 TABLETS DAILY, OR AS DIRECTED. ?Patient taking differently: TAKE 2 TABLETS DAILY, OR AS DIRECTED. 04/21/21  Yes Mallory Hector, MD  ?diazepam (VALIUM) 5 MG tablet Take 1 tablet (5 mg total) by mouth daily as needed for anxiety. ?Patient not taking: Reported on 07/16/2021 08/10/19   Mallory Hector, MD  ?traMADol (ULTRAM) 50 MG tablet Take 1 tablet (50 mg total) by mouth 3 (three) times daily. ?Patient not taking: Reported on 07/16/2021 07/20/17   Mallory Hector, MD  ? ?  ? ?VITAL SIGNS:  ?Blood pressure (!) 141/73, pulse 66, temperature 97.9 ?F (36.6 ?C), temperature source  Oral, resp. rate 14, height '5\' 4"'$  (1.626 m), weight 43.1 kg, last menstrual period 07/16/2013, SpO2 100 %. ? ?PHYSICAL EXAMINATION:  ?Physical Exam ? ?GENERAL:  61 y.o.-year-old Caucasian female patient lying in the bed with mild respiratory distress with conversational dyspnea.  EYES: Pupils equal, round, reactive to light and accommodation. No scleral icterus. Extraocular muscles intact.  ?HEENT: Head atraumatic, normocephalic. Oropharynx and nasopharynx clear.  ?NECK:  Supple, no jugular venous distention. No thyroid enlargement, no tenderness.  ?LUNGS: Diffuse expiratory wheezes with diminished expiratory airflow and harsh vesicular breathing. ?CARDIOVASCULAR: Regular rate and rhythm, S1, S2 normal. No murmurs, rubs, or gallops.  ?ABDOMEN: Soft, nondistended, nontender. Bowel sounds present. No organomegaly or mass.  ?EXTREMITIES: No pedal edema, cyanosis, or clubbing.  ?NEUROLOGIC: Cranial nerves II through XII are intact. Muscle strength 5/5 in all extremities. Sensation intact. Gait not checked.  ?PSYCHIATRIC: The patient is alert and oriented x 3.  Normal affect  and good eye contact. ?SKIN: No obvious rash, lesion, or ulcer.  ? ?LABORATORY PANEL:  ? ?CBC ?Recent Labs  ?Lab 07/16/21 ?1720  ?WBC 24.0*  ?HGB 10.2*  ?HCT 32.3*  ?PLT 364  ? ?-----------------------

## 2021-07-16 NOTE — ED Notes (Signed)
Pt taken off [place] per EDP for a trail run to see if oxygen stays above 93%. Will monitor and assess pt during the trail. ?

## 2021-07-17 DIAGNOSIS — F32A Depression, unspecified: Secondary | ICD-10-CM

## 2021-07-17 DIAGNOSIS — J9601 Acute respiratory failure with hypoxia: Secondary | ICD-10-CM

## 2021-07-17 DIAGNOSIS — J441 Chronic obstructive pulmonary disease with (acute) exacerbation: Secondary | ICD-10-CM | POA: Diagnosis not present

## 2021-07-17 DIAGNOSIS — I5032 Chronic diastolic (congestive) heart failure: Secondary | ICD-10-CM

## 2021-07-17 DIAGNOSIS — I5033 Acute on chronic diastolic (congestive) heart failure: Secondary | ICD-10-CM

## 2021-07-17 LAB — CBC
HCT: 29.9 % — ABNORMAL LOW (ref 36.0–46.0)
Hemoglobin: 9.1 g/dL — ABNORMAL LOW (ref 12.0–15.0)
MCH: 28.3 pg (ref 26.0–34.0)
MCHC: 30.4 g/dL (ref 30.0–36.0)
MCV: 92.9 fL (ref 80.0–100.0)
Platelets: 238 10*3/uL (ref 150–400)
RBC: 3.22 MIL/uL — ABNORMAL LOW (ref 3.87–5.11)
RDW: 14.8 % (ref 11.5–15.5)
WBC: 11.6 10*3/uL — ABNORMAL HIGH (ref 4.0–10.5)
nRBC: 0 % (ref 0.0–0.2)

## 2021-07-17 LAB — BASIC METABOLIC PANEL
Anion gap: 10 (ref 5–15)
BUN: 23 mg/dL — ABNORMAL HIGH (ref 6–20)
CO2: 31 mmol/L (ref 22–32)
Calcium: 8.3 mg/dL — ABNORMAL LOW (ref 8.9–10.3)
Chloride: 96 mmol/L — ABNORMAL LOW (ref 98–111)
Creatinine, Ser: 0.97 mg/dL (ref 0.44–1.00)
GFR, Estimated: >60 mL/min (ref >60)
Glucose, Bld: 157 mg/dL — ABNORMAL HIGH (ref 70–99)
Potassium: 3.3 mmol/L — ABNORMAL LOW (ref 3.5–5.1)
Sodium: 137 mmol/L (ref 135–145)

## 2021-07-17 LAB — MAGNESIUM: Magnesium: 2.6 mg/dL — ABNORMAL HIGH (ref 1.7–2.4)

## 2021-07-17 LAB — PROTIME-INR
INR: 5 (ref 0.8–1.2)
Prothrombin Time: 46.2 seconds — ABNORMAL HIGH (ref 11.4–15.2)

## 2021-07-17 LAB — HIV ANTIBODY (ROUTINE TESTING W REFLEX): HIV Screen 4th Generation wRfx: NONREACTIVE

## 2021-07-17 MED ORDER — IPRATROPIUM-ALBUTEROL 0.5-2.5 (3) MG/3ML IN SOLN
3.0000 mL | Freq: Four times a day (QID) | RESPIRATORY_TRACT | Status: DC
  Administered 2021-07-17 – 2021-07-18 (×5): 3 mL via RESPIRATORY_TRACT
  Filled 2021-07-17 (×5): qty 3

## 2021-07-17 MED ORDER — BUDESONIDE 0.25 MG/2ML IN SUSP
0.2500 mg | Freq: Two times a day (BID) | RESPIRATORY_TRACT | Status: DC
  Administered 2021-07-17 – 2021-07-22 (×11): 0.25 mg via RESPIRATORY_TRACT
  Filled 2021-07-17 (×11): qty 2

## 2021-07-17 MED ORDER — POTASSIUM CHLORIDE CRYS ER 20 MEQ PO TBCR
40.0000 meq | EXTENDED_RELEASE_TABLET | Freq: Once | ORAL | Status: AC
  Administered 2021-07-17: 40 meq via ORAL
  Filled 2021-07-17: qty 2

## 2021-07-17 MED ORDER — PROPAFENONE HCL 300 MG PO TABS
300.0000 mg | ORAL_TABLET | Freq: Three times a day (TID) | ORAL | Status: DC
  Administered 2021-07-17 – 2021-07-22 (×15): 300 mg via ORAL
  Filled 2021-07-17 (×15): qty 1

## 2021-07-17 MED ORDER — BENZONATATE 100 MG PO CAPS
100.0000 mg | ORAL_CAPSULE | Freq: Three times a day (TID) | ORAL | Status: DC
  Administered 2021-07-17 – 2021-07-20 (×10): 100 mg via ORAL
  Filled 2021-07-17 (×10): qty 1

## 2021-07-17 MED ORDER — ENSURE ENLIVE PO LIQD
237.0000 mL | Freq: Two times a day (BID) | ORAL | Status: DC
  Administered 2021-07-18 – 2021-07-20 (×4): 237 mL via ORAL

## 2021-07-17 MED ORDER — POTASSIUM CHLORIDE 20 MEQ PO PACK
40.0000 meq | PACK | Freq: Once | ORAL | Status: DC
  Filled 2021-07-17: qty 2

## 2021-07-17 MED ORDER — GUAIFENESIN-DM 100-10 MG/5ML PO SYRP: 5.0000 mL | ORAL_SOLUTION | ORAL | Status: DC | PRN

## 2021-07-17 NOTE — Assessment & Plan Note (Addendum)
-  Repleted and within normal limits at discharge ?-Recommending repeat basic metabolic panel to assess electrolytes level and stability. ?

## 2021-07-17 NOTE — Progress Notes (Signed)
Initial Nutrition Assessment ? ?DOCUMENTATION CODES:  ? ?Non-severe (moderate) malnutrition in context of chronic illness, Underweight ? ?INTERVENTION:  ?- Liberalize diet from a heart healthy to a 2gm sodium diet to provide a greater variety of menu options to enhance nutritional adequacy ? ?- Continue MVI with minerals daily ? ?- Ensure Enlive po BID, each supplement provides 350 kcal and 20 grams of protein (pt prefers chocolate) ? ?- Provided "High Calorie, High Protein Nutrition Therapy" to AVS ? ?NUTRITION DIAGNOSIS:  ? ?Moderate Malnutrition related to chronic illness (COPD, afib, CHF) as evidenced by mild fat depletion, severe muscle depletion. ? ?GOAL:  ? ?Patient will meet greater than or equal to 90% of their needs ? ?MONITOR:  ? ?PO intake, Supplement acceptance, Diet advancement, Labs, Weight trends ? ?REASON FOR ASSESSMENT:  ? ?Malnutrition Screening Tool ?  ? ?ASSESSMENT:  ? ?Pt admitted with SOB secondary to COPD exacerbation. PMH significant for paroxysmal afib on Coumadin, anxiety and depression, thoracic aortic aneurysm, CHF and coarctation of the aorta. ? ?Pt with daughter at bedside. She states that since Monday she has only eaten 2 hard boiled eggs. She endorses nausea which is ongoing and emesis which has improved since admission. She believes that the emesis was attributed to taking medication on an empty stomach. Discussed with pt and reached out to RN about ongoing nausea.  ? ?In general, she reports eating ~2 meals daily. She is on coumadin and reports that her INR levels are very sensitive to Vitamin K containing foods so she tries to avoid them as much as possible unless instructed by her MD. Her MD told her to limit amount of Ensure that she drinks at home to 1-2 per week. Discussed benefits of ordering them during admission given her decrease in PO intake and monitoring of labs and she is agreeable to receive them during admission. We also discussed liberalization of diet. Given her  history of CHF, will change diet from heart healthy to 2gm sodium to allow for more menu options to enhance nutritional adequacy.  ? ?Spoke briefly with pt and her daughter about ways to optimize nutritional intake with high calorie, high protein options. They are looking into low Vitamin K containing supplements and have also been utilizing addition of peanut butter in snacks. Provided handout from the Nutrition Care Manual on ideas of how to increase calorie and protein with minimal intake to AVS. ? ?Pt reports that no matter what she does, she cannot seem to gain weight and her baseline weight remains around 95 lbs. Denies significant recent weight loss. 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 Reviewed weight history. Pt's weight appears to have slow, but steady decline within the past 2 years. ? ?Medications: lasix, steroid, MVI, IV abx ? ?Labs: potassium 3.3, BUN 23, Mg 2.6 ? ?I/O's: +1.5L since admission ? ?NUTRITION - FOCUSED PHYSICAL EXAM: ? ?Flowsheet Row Most Recent Value  ?Orbital Region Mild depletion  ?Upper Arm Region Severe depletion  ?Thoracic and Lumbar Region Moderate depletion  ?Buccal Region Mild depletion  ?Temple Region Moderate depletion  ?Clavicle Bone Region Severe depletion  ?Clavicle and Acromion Bone Region Severe depletion  ?Scapular Bone Region Moderate depletion  ?Dorsal Hand Severe depletion  ?Patellar Region Severe depletion  ?Anterior Thigh Region Severe depletion  ?Posterior Calf Region Severe depletion  ?Edema (RD Assessment) None  ?Hair Reviewed  ?Eyes Reviewed  ?Mouth Reviewed  ?Skin Reviewed  ?Nails Reviewed  ? ?  ? ? ?Diet Order:   ?Diet Order   ? ?       ?  Diet 2 gram sodium Room service appropriate? Yes; Fluid consistency: Thin  Diet effective now       ?  ? ?  ?  ? ?  ? ? ?EDUCATION NEEDS:  ? ?Education needs have been addressed ? ?Skin:  Skin Assessment: Reviewed RN Assessment ? ?Last BM:  3/15 ? ?Height:  ? ?Ht Readings from Last 1 Encounters:  ?07/16/21 '5\' 4"'$  (1.626 m)   ? ? ?Weight:  ? ?Wt Readings from Last 1 Encounters:  ?07/17/21 43.5 kg  ? ?BMI:  Body mass index is 16.46 kg/m?. ? ?Estimated Nutritional Needs:  ? ?Kcal:  1400-1600 ? ?Protein:  70-85g ? ?Fluid:  >/=1.4L ? ?Clayborne Dana, RDN, LDN ?Clinical Nutrition ?

## 2021-07-17 NOTE — Hospital Course (Addendum)
Per HPI: ?Mallory Sutton is a 61 y.o. Caucasian female with medical history significant for paroxysmal atrial fibrillation on Coumadin, anxiety and depression, thoracic aortic aneurysm, CHF and coarctation of the aorta, who presented to the emergency room with acute onset of worsening dyspnea with associated congested cough with inability to expectorate as well as wheezing since yesterday.  Has history of COPD without any home oxygen use.  She admits to nausea without vomiting.  She denies any fever or chills.  She has been having chest pain only with cough.  No dysuria, oliguria or hematuria, urgency or frequency or flank pain.  No leg pain or edema or recent travels or surgeries. She denies any orthopnea or proximal nocturnal dyspnea or worsening lower extremity edema. ? ?07/17/21: Patient has been admitted with COPD exacerbation along with acute hypoxemic respiratory failure and is currently on 4 L nasal cannula oxygen.  She continues to have significant amounts of cough. ? ?07/18/21: Patient continues to have significant amounts of cough and ongoing wheezing with COPD exacerbation.  Continue IV steroids and breathing treatments as prescribed.  Pulmonology consulted for assistance. ? ?07/20/21: Patient overall appears to be improving and is currently on 2 L oxygen.  2D echocardiogram with LVEF 60-65% and moderate LVH.  BNP decreasing.  Plan to continue treatments through today and likely will be stable for discharge in the next 1-2 days. ?

## 2021-07-17 NOTE — Assessment & Plan Note (Addendum)
-  Patient initially received IV Lasix, condition has improved.   ?-Discharge back on her home Lasix therapy; advised to follow low-sodium diet and to check her weight on daily basis. ?-Continue outpatient follow-up with cardiology service. ?-2D echo demonstrating preserved ejection fraction. ? ?

## 2021-07-17 NOTE — Discharge Instructions (Signed)
High Calorie, High Protein Nutrition Therapy ? ?A high-calorie, high-protein diet has been recommended to you. Your registered dietitian nutritionist (RDN) may have recommended this diet because you are having difficulty eating enough calories throughout the day, you have lost weight, and/or you need to add protein to your diet. ?Sometimes you may not feel like eating, even if you know the importance of good nutrition. The recommendations in this handout can help you with the following: ?Regaining your strength and energy ?Keeping your body healthy ?Healing and recovering from surgery or illness and fighting infection ? ?Tips ? ?Schedule Your Meals and Snacks ?Several small meals and snacks are often better tolerated and digested than large meals. ?Strategies ?Plan to eat 3 meals and 3 snacks daily. ?Experiment with timing meals to find out when you have a larger appetite. ?Appetite may be greatest in the morning after not eating all night so you may prefer to eat your larger meals and snacks in the morning and at lunch. ?Breakfast-type foods are often better tolerated so eat foods such as eggs, pancakes, waffles and cereal for any meal or snack. ?Carry snacks with you so you are prepared to eat every 2 to 3 hours. ?Determine what works best for you if your body?s cues for feeling hungry or full are not working. ?Eat a small meal or snack even if you don?t feel hungry. ?Set a timer to remind you when it is time to eat. ?Take a walk before you eat (with health care provider?s approval). ?Light or moderate physical activity can help you maintain muscle and increase your appetite. ? ?Make Eating Enjoyable ?Taking steps to make the experience enjoyable may help to increase your interest in eating and improve your appetite. ?Strategies: ?Eat with others whenever possible. ?Include your favorite foods to make meals more enjoyable. ?Try new foods. ?Save your beverage for the end of the meal so that you have more room for  food before you get full. ? ?Add Calories to Your Meals and Snacks ?Try adding calorie-dense foods so that each bite provides more nutrition. ?Strategies ?Drink milk, chocolate milk, soy milk, or smoothies instead of low-calorie beverages such as diet drinks or water. ?Cook with milk or soy milk instead of water when making dishes such as hot cereal, cocoa, or pudding. ?Add jelly, jam, honey, butter or margarine to bread and crackers. Add jam or fruit to ice cream and as a topping over cake. ?Mix dried fruit, nuts, granola, honey, or dry cereal with yogurt or hot cereals. ?Enjoy snacks such as milkshakes, smoothies, pudding, ice cream, or custard. ?Blend a fruit smoothie of a banana, frozen berries, milk or soy milk, and 1 tablespoon nonfat powdered milk or protein powder. ? ?Add Protein to Your Meals and Snacks ?Choose at least one protein food at each meal and snack to increase your daily intake. ?Strategies ?Add ? cup nonfat dry milk powder or protein powder to make a high-protein milk to drink or to use in recipes that call for milk. Vanilla or peppermint extract or unsweetened cocoa powder could help to boost the flavor. ?Add hard-cooked eggs, leftover meat, grated cheese, canned beans or tofu to noodles, rice, salads, sandwiches, soups, casseroles, pasta, tuna and other mixed dishes. ?Add powdered milk or protein powder to hot cereals, meatloaf, casseroles, scrambled eggs, sauces, cream soups, and shakes. ?Add beans and lentils to salads, soups, casseroles, and vegetable dishes. ?Eat cottage cheese or yogurt, especially [place] yogurt, with fruit as a snack or dessert. ?Eat peanut or  other nut butters on crackers, bread, toast, waffles, apples, bananas or celery sticks. Add it to milkshakes, smoothies, or desserts. ?Consider a ready-made protein shake. Your RDN will make recommendations. ? ?Add Fats to Your Meals and Snacks ?Try adding fats to your meals and snacks. Fat provides more calories in fewer bites than  carbohydrate or protein and adds flavors to your foods. ?Strategies ?Snack on nuts and seeds or add them to foods like salads, pasta, cereals, yogurt, and ice cream.  ?Saut? or stir-fry vegetables, meats, chicken, fish or tofu in olive or canola oil.  ?Add olive oil, other vegetable oils, butter or margarine to soups, vegetables, potatoes, cooked cereal, rice, pasta, bread, crackers, pancakes, or waffles. ?Snack on olives or add to pasta, pizza, or salad. ?Add avocado or guacamole to your salads, sandwiches, and other entrees. ?Include fatty fish such as salmon in your weekly meal plan. ?For general food safety tips, especially for clients with immunocompromised conditions, ask your RDN for the Food Safety Nutrition Therapy handout. ? ?Small Meal and Snack Ideas ?These snacks and meals are recommended when you have to eat but aren?t necessarily hungry.  They are good choices because they are high in protein and high in calories.  ? ?2 graham crackers ?2 tablespoons peanut or other nut butter ?1 cup milk 2 slices whole wheat toast topped with: ?? avocado, mashed ?Seasoning of your choice  ?? cup [place] yogurt ?? cup fruit ?? cup granola 2 deviled egg halves ?5 whole wheat crackers  ?1 cup cream of tomato soup ?? grilled cheese sandwich 1 toasted waffle topped with: ?2 tablespoons peanut or nut butter ?1 tablespoon jam  ?Trail mix made with: ? cup nuts ?? cup dried fruit ?? cup cold cereal, any variety ? cup oatmeal or cream of wheat cereal ?1 tablespoon peanut or nut butter ?? cup diced fruit  ? ?Copyright 2020 ? [employer]. All rights reserved ? ?

## 2021-07-17 NOTE — Assessment & Plan Note (Addendum)
-  Mood overall stable ?-No hallucinations or suicidal ideation ?-Continue Zoloft and Valium. ?

## 2021-07-17 NOTE — Progress Notes (Addendum)
?PROGRESS NOTE ? ? ? ?Mallory Sutton  WOE:[id number] DOB: 16-Dec-1960 DOA: 07/16/2021 ?PCP: Celene Squibb, MD ? ? ?Brief Narrative:  ?Per HPI: ?MONTEZ Sutton is a 61 y.o. Caucasian female with medical history significant for paroxysmal atrial fibrillation on Coumadin, anxiety and depression, thoracic aortic aneurysm, CHF and coarctation of the aorta, who presented to the emergency room with acute onset of worsening dyspnea with associated congested cough with inability to expectorate as well as wheezing since yesterday.  She admits to nausea without vomiting.  She denies any fever or chills.  She has been having chest pain only with cough.  No dysuria, oliguria or hematuria, urgency or frequency or flank pain.  No leg pain or edema or recent travels or surgeries. She denies any orthopnea or proximal nocturnal dyspnea or worsening lower extremity edema. ? ?07/17/21: Patient has been admitted with COPD exacerbation along with acute hypoxemic respiratory failure and is currently on 4 L nasal cannula oxygen.  She continues to have significant amounts of cough.  ? ? ?Assessment & Plan: ?  ?Principal Problem: ?  COPD exacerbation ([place]) ?Active Problems: ?  Acute respiratory failure with hypoxia ([place]) ?  Hypokalemia ?  Paroxysmal atrial fibrillation (HCC) ?  Chronic diastolic CHF (congestive heart failure) ([place]) ?  Anxiety and depression ? ?Assessment and Plan: ?* COPD exacerbation ([place]) ?- The patient be admitted to a medical telemetry bed. ?- We will continue steroid therapy with IV Solu-Medrol. ?- We will continue bronchodilator therapy with DuoNebs 4 times daily and every 4 hours as needed. ?- Mucolytic therapy will be provided. ?- We will add IV antibiotic therapy with Rocephin and Zithromax. ? ? ?Acute respiratory failure with hypoxia ([place]) ?- This is secondary to #1. ?- O2 protocol will be provided. ? ?Hypokalemia ?- Potassium replacement will be provided. ?- We will check magnesium level. ? ?Chronic diastolic CHF  (congestive heart failure) ([place]) ?We will continue diuresis with IV Lasix especially given elevated BNP. ? ?Paroxysmal atrial fibrillation ([place]) ?- We will continue Toprol-XL and Rythmol. ?- We will continue Coumadin. ? ?Anxiety and depression ?- We will continue Zoloft and Valium. ? ? ? ?DVT prophylaxis:Coumadin ?Code Status: Full ?Family Communication: None at bedside ?Disposition Plan:  ?Status is: Inpatient ?Remains inpatient appropriate because: Requires IV medications and scheduled breathing treatments. ? ? ?Consultants:  ?None ? ?Procedures:  ?See below ? ?Antimicrobials:  ?Anti-infectives (From admission, onward)  ? ? Start     Dose/Rate Route Frequency Ordered Stop  ? 07/16/21 2300  cefTRIAXone (ROCEPHIN) 1 g in sodium chloride 0.9 % 100 mL IVPB       ? 1 g ?200 mL/hr over 30 Minutes Intravenous Every 24 hours 07/16/21 2242 07/21/21 2259  ? 07/16/21 2145  azithromycin (ZITHROMAX) 500 mg in sodium chloride 0.9 % 250 mL IVPB       ? 500 mg ?250 mL/hr over 60 Minutes Intravenous  Once 07/16/21 2135 07/16/21 2259  ? ?  ? ?Subjective: ?Patient seen and evaluated today with ongoing significant amounts of cough and wheezing noted this morning. ? ?Objective: ?Vitals:  ? 07/16/21 2230 07/17/21 0145 07/17/21 0236 07/17/21 0556  ?BP: (!) 139/59 132/78 (!) 152/81 137/75  ?Pulse: 68 64 61 63  ?Resp: '20 14 17 18  '$ ?Temp:   97.9 ?F (36.6 ?C) 97.9 ?F (36.6 ?C)  ?TempSrc:      ?SpO2: 100% 100% 100% 100%  ?Weight:   43.5 kg   ?Height:      ? ? ?Intake/Output  Summary (Last 24 hours) at 07/17/2021 0904 ?Last data filed at 07/17/2021 0500 ?Gross per 24 hour  ?Intake 1487.17 ml  ?Output --  ?Net 1487.17 ml  ? ?Filed Weights  ? 07/16/21 1622 07/17/21 0236  ?Weight: 43.1 kg 43.5 kg  ? ? ?Examination: ? ?General exam: Appears calm and comfortable  ?Respiratory system: Wheezing bilaterally with ongoing cough.  4 L nasal cannula ?Cardiovascular system: S1 & S2 heard, RRR.  ?Gastrointestinal system: Abdomen is soft ?Central nervous  system: Alert and awake ?Extremities: No edema ?Skin: No significant lesions noted ?Psychiatry: Flat affect. ? ? ? ?Data Reviewed: I have personally reviewed following labs and imaging studies ? ?CBC: ?Recent Labs  ?Lab 07/16/21 ?1720 07/17/21 ?6010  ?WBC 24.0* 11.6*  ?NEUTROABS 19.1*  --   ?HGB 10.2* 9.1*  ?HCT 32.3* 29.9*  ?MCV 90.2 92.9  ?PLT 364 238  ? ?Basic Metabolic Panel: ?Recent Labs  ?Lab 07/16/21 ?1644 07/16/21 ?1720 07/17/21 ?9323  ?NA  --  135 137  ?K  --  3.1* 3.3*  ?CL  --  93* 96*  ?CO2  --  29 31  ?GLUCOSE  --  127* 157*  ?BUN  --  29* 23*  ?CREATININE  --  1.15* 0.97  ?CALCIUM  --  9.0 8.3*  ?MG 2.6*  --   --   ? ?GFR: ?Estimated Creatinine Clearance: 42.9 mL/min (by C-G formula based on SCr of 0.97 mg/dL). ?Liver Function Tests: ?Recent Labs  ?Lab 07/16/21 ?1720  ?AST 56*  ?ALT 25  ?ALKPHOS 62  ?BILITOT 0.8  ?PROT 7.4  ?ALBUMIN 3.9  ? ?No results for input(s): LIPASE, AMYLASE in the last 168 hours. ?No results for input(s): AMMONIA in the last 168 hours. ?Coagulation Profile: ?Recent Labs  ?Lab 07/16/21 ?1644  ?INR 4.0*  ? ?Cardiac Enzymes: ?No results for input(s): CKTOTAL, CKMB, CKMBINDEX, TROPONINI in the last 168 hours. ?BNP (last 3 results) ?No results for input(s): PROBNP in the last 8760 hours. ?HbA1C: ?No results for input(s): HGBA1C in the last 72 hours. ?CBG: ?No results for input(s): GLUCAP in the last 168 hours. ?Lipid Profile: ?No results for input(s): CHOL, HDL, LDLCALC, TRIG, CHOLHDL, LDLDIRECT in the last 72 hours. ?Thyroid Function Tests: ?No results for input(s): TSH, T4TOTAL, FREET4, T3FREE, THYROIDAB in the last 72 hours. ?Anemia Panel: ?No results for input(s): VITAMINB12, FOLATE, FERRITIN, TIBC, IRON, RETICCTPCT in the last 72 hours. ?Sepsis Labs: ?No results for input(s): PROCALCITON, LATICACIDVEN in the last 168 hours. ? ?Recent Results (from the past 240 hour(s))  ?Resp Panel by RT-PCR (Flu A&B, Covid) Nasopharyngeal Swab     Status: None  ? Collection Time: 07/16/21  5:17  PM  ? Specimen: Nasopharyngeal Swab; Nasopharyngeal(NP) swabs in vial transport medium  ?Result Value Ref Range Status  ? SARS Coronavirus 2 by RT PCR NEGATIVE NEGATIVE Final  ?  Comment: (NOTE) ?SARS-CoV-2 target nucleic acids are NOT DETECTED. ? ?The SARS-CoV-2 RNA is generally detectable in upper respiratory ?specimens during the acute phase of infection. The lowest ?concentration of SARS-CoV-2 viral copies this assay can detect is ?138 copies/mL. A negative result does not preclude SARS-Cov-2 ?infection and should not be used as the sole basis for treatment or ?other patient management decisions. A negative result may occur with  ?improper specimen collection/handling, submission of specimen other ?than nasopharyngeal swab, presence of viral mutation(s) within the ?areas targeted by this assay, and inadequate number of viral ?copies(<138 copies/mL). A negative result must be combined with ?clinical observations, patient history, and epidemiological ?  information. The expected result is Negative. ? ?Fact Sheet for Patients:  ?EntrepreneurPulse.com.au ? ?Fact Sheet for Healthcare Providers:  ?IncredibleEmployment.be ? ?This test is no t yet approved or cleared by the [place] FDA and  ?has been authorized for detection and/or diagnosis of SARS-CoV-2 by ?FDA under an Emergency Use Authorization (EUA). This EUA will remain  ?in effect (meaning this test can be used) for the duration of the ?COVID-19 declaration under Section 564(b)(1) of the Act, 21 ?U.S.C.section 360bbb-3(b)(1), unless the authorization is terminated  ?or revoked sooner.  ? ? ?  ? Influenza A by PCR NEGATIVE NEGATIVE Final  ? Influenza B by PCR NEGATIVE NEGATIVE Final  ?  Comment: (NOTE) ?The Xpert Xpress SARS-CoV-2/FLU/RSV plus assay is intended as an aid ?in the diagnosis of influenza from Nasopharyngeal swab specimens and ?should not be used as a sole basis for treatment. Nasal washings and ?aspirates are  unacceptable for Xpert Xpress SARS-CoV-2/FLU/RSV ?testing. ? ?Fact Sheet for Patients: ?EntrepreneurPulse.com.au ? ?Fact Sheet for Healthcare Providers: ?IncredibleEmployment.be

## 2021-07-17 NOTE — Progress Notes (Signed)
Date and time results received: 07/17/21 1258 ? ?(use smartphrase ".now" to insert current time) ? ?Test: INR ?Critical Value: 5.0 ? ?Name of Provider Notified: MD Manuella Ghazi ? ?Orders Received? Or Actions Taken?:  No new orders received at this time ?

## 2021-07-17 NOTE — Progress Notes (Signed)
ANTICOAGULATION CONSULT NOTE - Initial Consult ? ?Pharmacy Consult for Coumadin ?Indication: atrial fibrillation ? ?Allergies  ?Allergen Reactions  ? Amiodarone Nausea Only  ? Penicillins Hives  ?  Has patient had a PCN reaction causing immediate rash, facial/tongue/throat swelling, SOB or lightheadedness with hypotension: YES ?Has patient had a PCN reaction causing severe rash involving mucus membranes or skin necrosis: NO ?Has patient had a PCN reaction that required hospitalization NO ?Has patient had a PCN reaction occurring within the last 10 years: NO ?If all of the above answers are "NO", then may proceed with Cephalosporin use. ?  ? ? ?Patient Measurements: ?Height: '5\' 4"'$  (162.6 cm) ?Weight: 43.5 kg (95 lb 14.4 oz) ?IBW/kg (Calculated) : 54.7 ? ?Vital Signs: ?Temp: 97.9 ?F (36.6 ?C) (03/16 0556) ?BP: 137/75 (03/16 0556) ?Pulse Rate: 63 (03/16 0556) ? ?Labs: ?Recent Labs  ?  07/16/21 ?1644 07/16/21 ?1720 07/17/21 ?4854 07/17/21 ?1109  ?HGB  --  10.2* 9.1*  --   ?HCT  --  32.3* 29.9*  --   ?PLT  --  364 238  --   ?LABPROT 39.2*  --   --  46.2*  ?INR 4.0*  --   --  5.0*  ?CREATININE  --  1.15* 0.97  --   ? ? ?Estimated Creatinine Clearance: 42.9 mL/min (by C-G formula based on SCr of 0.97 mg/dL). ? ? ?Medical History: ?Past Medical History:  ?Diagnosis Date  ? Aortic stenosis   ? ATRIAL ARRHYTHMIAS   ? Bicuspid aortic valve   ? CHF (congestive heart failure) ([place])   ? COARCTATION OF AORTA   ? ENDOMETRIOSIS   ? Heart murmur   ? Paroxysmal atrial fibrillation (HCC)   ? Shortness of breath   ? THORACIC AORTIC ANEURYSM   ? ? ?Medications:  ?Medications Prior to Admission  ?Medication Sig Dispense Refill Last Dose  ? aspirin EC 81 MG tablet Take 81 mg by mouth daily.   07/15/2021  ? benzonatate (TESSALON PERLES) 100 MG capsule Take 1 capsule (100 mg total) by mouth 3 (three) times daily as needed for cough. 30 capsule 1 07/15/2021  ? diazepam (VALIUM) 10 MG tablet Take 10 mg by mouth daily as needed.   Past Week  ?  furosemide (LASIX) 40 MG tablet Take 80 mg by mouth daily.   07/15/2021  ? gabapentin (NEURONTIN) 300 MG capsule Take 300 mg by mouth See admin instructions. Take 300 mg in the morning, 300 mg in the afternoon   600 mg at bedtime   07/15/2021  ? HYDROcodone-acetaminophen (NORCO/VICODIN) 5-325 MG tablet Take 2 tablets by mouth 2 (two) times daily.   07/15/2021  ? metoprolol succinate (TOPROL-XL) 25 MG 24 hr tablet TAKE 1 TABLET BY MOUTH ONCE DAILY. 90 tablet 3 07/15/2021 at AM  ? Multiple Vitamin (MULTI-VITAMINS) TABS Take 1 tablet by mouth daily.    Past Week  ? NEXLETOL 180 MG TABS Take 1 tablet by mouth daily.   07/15/2021  ? ondansetron (ZOFRAN ODT) 4 MG disintegrating tablet Take 1 tablet (4 mg total) by mouth every 8 (eight) hours as needed for nausea or vomiting. 20 tablet 0 unknown  ? potassium chloride SA (KLOR-CON) 20 MEQ tablet TAKE (1) TABLET BY MOUTH ONCE DAILY. (Patient taking differently: Take 20 mEq by mouth daily.) 90 tablet 1 07/15/2021  ? propafenone (RYTHMOL) 300 MG tablet TAKE 1 TABLET BY MOUTH THREE TIMES A DAY. 90 tablet 6 07/16/2021  ? sertraline (ZOLOFT) 100 MG tablet Take 100 mg by mouth daily.  07/15/2021  ? Turmeric 500 MG CAPS Take 500 mg by mouth 3 (three) times a week.   Past Week  ? warfarin (COUMADIN) 1 MG tablet TAKE 3 TABLETS DAILY, OR AS DIRECTED. (Patient taking differently: TAKE 2 TABLETS DAILY, OR AS DIRECTED.) 90 tablet 0 07/15/2021 at 2200  ? diazepam (VALIUM) 5 MG tablet Take 1 tablet (5 mg total) by mouth daily as needed for anxiety. (Patient not taking: Reported on 07/16/2021) 30 tablet 0 Not Taking  ? traMADol (ULTRAM) 50 MG tablet Take 1 tablet (50 mg total) by mouth 3 (three) times daily. (Patient not taking: Reported on 07/16/2021) 90 tablet 3 Not Taking  ? ? ?Assessment: ?Patient on chronic anticoagulation with Coumadin for afib. Followed in anticoag clinic and last visit on 07/01/21 her dose changed to Coumadin '2mg'$  daily. ?INR is 5, supratherapeutic ? ?Goal of Therapy:  ?INR  2-3 ?Monitor platelets by anticoagulation protocol: Yes ?  ?Plan:  ?No coumadin today ?PT-INR daily ?Monitor for S/S of bleeding ? ?Isac Sarna, BS Pharm D, BCPS ?Clinical Pharmacist ?Pager [phone] ?07/17/2021,1:33 PM ? ? ?

## 2021-07-17 NOTE — Assessment & Plan Note (Addendum)
-  Still has some bronchospasm and nonproductive cough.  ?-Continue Tussionex, steroids tapering and initiation of rescue inhaler and Symbicort maintenance therapy. ?-Outpatient follow-up with pulmonologist for PFTs ?-Continue twice daily PPI. ?-Desaturation screening demonstrated the need of 2 L oxygen supplementation especially with exertion (oxygen saturation dropping to 87% with activity). ? ?

## 2021-07-17 NOTE — Assessment & Plan Note (Addendum)
-   Due to COPD exacerbation ?-Continue gradually improving; good saturation at rest on room air; with activity oxygen saturation drops to 87%.  Patient has been discharged on 2 L of oxygen supplementation mainly to be used with activity. ?-Outpatient follow-up with pulmonology as has been recommended. ?-Continue treatment with steroids tapering, bronchodilator management and antitussive medications. ?

## 2021-07-17 NOTE — Assessment & Plan Note (Addendum)
-  Continue warfarin and Toprol XL, currently in sinus rhythm. ?-Continue outpatient follow-up with cardiology service. ?

## 2021-07-18 DIAGNOSIS — J441 Chronic obstructive pulmonary disease with (acute) exacerbation: Secondary | ICD-10-CM | POA: Diagnosis not present

## 2021-07-18 DIAGNOSIS — J9601 Acute respiratory failure with hypoxia: Secondary | ICD-10-CM | POA: Diagnosis not present

## 2021-07-18 LAB — BASIC METABOLIC PANEL
Anion gap: 8 (ref 5–15)
BUN: 26 mg/dL — ABNORMAL HIGH (ref 6–20)
CO2: 31 mmol/L (ref 22–32)
Calcium: 8.5 mg/dL — ABNORMAL LOW (ref 8.9–10.3)
Chloride: 99 mmol/L (ref 98–111)
Creatinine, Ser: 1 mg/dL (ref 0.44–1.00)
GFR, Estimated: >60 mL/min (ref >60)
Glucose, Bld: 127 mg/dL — ABNORMAL HIGH (ref 70–99)
Potassium: 3.8 mmol/L (ref 3.5–5.1)
Sodium: 138 mmol/L (ref 135–145)

## 2021-07-18 LAB — CBC
HCT: 28.5 % — ABNORMAL LOW (ref 36.0–46.0)
Hemoglobin: 8.9 g/dL — ABNORMAL LOW (ref 12.0–15.0)
MCH: 29.8 pg (ref 26.0–34.0)
MCHC: 31.2 g/dL (ref 30.0–36.0)
MCV: 95.3 fL (ref 80.0–100.0)
Platelets: 229 10*3/uL (ref 150–400)
RBC: 2.99 MIL/uL — ABNORMAL LOW (ref 3.87–5.11)
RDW: 14.9 % (ref 11.5–15.5)
WBC: 14.9 10*3/uL — ABNORMAL HIGH (ref 4.0–10.5)
nRBC: 0 % (ref 0.0–0.2)

## 2021-07-18 LAB — PROTIME-INR
INR: 4 — ABNORMAL HIGH (ref 0.8–1.2)
Prothrombin Time: 38.8 seconds — ABNORMAL HIGH (ref 11.4–15.2)

## 2021-07-18 LAB — MAGNESIUM: Magnesium: 2.5 mg/dL — ABNORMAL HIGH (ref 1.7–2.4)

## 2021-07-18 MED ORDER — IPRATROPIUM-ALBUTEROL 0.5-2.5 (3) MG/3ML IN SOLN
3.0000 mL | Freq: Four times a day (QID) | RESPIRATORY_TRACT | Status: DC | PRN
  Administered 2021-07-18: 3 mL via RESPIRATORY_TRACT

## 2021-07-18 MED ORDER — FUROSEMIDE 80 MG PO TABS
80.0000 mg | ORAL_TABLET | Freq: Every day | ORAL | Status: DC
  Administered 2021-07-19 – 2021-07-22 (×4): 80 mg via ORAL
  Filled 2021-07-18 (×4): qty 1

## 2021-07-18 MED ORDER — HYDRALAZINE HCL 20 MG/ML IJ SOLN: 10.0000 mg | INTRAMUSCULAR | Status: DC | PRN

## 2021-07-18 MED ORDER — ARFORMOTEROL TARTRATE 15 MCG/2ML IN NEBU
15.0000 ug | INHALATION_SOLUTION | Freq: Two times a day (BID) | RESPIRATORY_TRACT | Status: DC
  Administered 2021-07-18 – 2021-07-22 (×8): 15 ug via RESPIRATORY_TRACT
  Filled 2021-07-18 (×8): qty 2

## 2021-07-18 MED ORDER — ALBUTEROL SULFATE (2.5 MG/3ML) 0.083% IN NEBU
2.5000 mg | INHALATION_SOLUTION | RESPIRATORY_TRACT | Status: DC | PRN
Start: 2021-07-18 — End: 2021-07-19
  Administered 2021-07-19: 2.5 mg via RESPIRATORY_TRACT
  Filled 2021-07-18: qty 3

## 2021-07-18 MED ORDER — DM-GUAIFENESIN ER 30-600 MG PO TB12
2.0000 | ORAL_TABLET | Freq: Two times a day (BID) | ORAL | Status: DC
  Administered 2021-07-18 – 2021-07-22 (×9): 2 via ORAL
  Filled 2021-07-18 (×9): qty 2

## 2021-07-18 MED ORDER — METOPROLOL SUCCINATE ER 25 MG PO TB24
25.0000 mg | ORAL_TABLET | Freq: Every day | ORAL | Status: DC
  Administered 2021-07-19 – 2021-07-22 (×4): 25 mg via ORAL
  Filled 2021-07-18 (×4): qty 1

## 2021-07-18 MED ORDER — PANTOPRAZOLE SODIUM 40 MG PO TBEC
40.0000 mg | DELAYED_RELEASE_TABLET | Freq: Two times a day (BID) | ORAL | Status: DC
  Administered 2021-07-18 – 2021-07-22 (×8): 40 mg via ORAL
  Filled 2021-07-18 (×8): qty 1

## 2021-07-18 MED ORDER — AZITHROMYCIN 250 MG PO TABS
500.0000 mg | ORAL_TABLET | Freq: Every day | ORAL | Status: AC
  Administered 2021-07-18 – 2021-07-20 (×3): 500 mg via ORAL
  Filled 2021-07-18 (×3): qty 2

## 2021-07-18 MED ORDER — METHYLPREDNISOLONE SODIUM SUCC 40 MG IJ SOLR
40.0000 mg | Freq: Two times a day (BID) | INTRAMUSCULAR | Status: DC
Start: 2021-07-18 — End: 2021-07-22
  Administered 2021-07-18 – 2021-07-21 (×7): 40 mg via INTRAVENOUS
  Filled 2021-07-18 (×8): qty 1

## 2021-07-18 NOTE — Progress Notes (Signed)
Patient very short of breath this morning, she was wheezing quite a lot. Respiratory provided a PRN breathing treatment. Dr. Manuella Ghazi in to see patient and meds adjusted. ?

## 2021-07-18 NOTE — Progress Notes (Signed)
?PROGRESS NOTE ? ? ? ?Mallory Sutton  JJK:[id number] DOB: [birth year] DOA: 07/16/2021 ?PCP: Celene Squibb, MD ? ? ?Brief Narrative:  ?Per HPI: ?Mallory Sutton is a 61 y.o. Caucasian female with medical history significant for paroxysmal atrial fibrillation on Coumadin, anxiety and depression, thoracic aortic aneurysm, CHF and coarctation of the aorta, who presented to the emergency room with acute onset of worsening dyspnea with associated congested cough with inability to expectorate as well as wheezing since yesterday.  Has history of COPD without any home oxygen use.  She admits to nausea without vomiting.  She denies any fever or chills.  She has been having chest pain only with cough.  No dysuria, oliguria or hematuria, urgency or frequency or flank pain.  No leg pain or edema or recent travels or surgeries. She denies any orthopnea or proximal nocturnal dyspnea or worsening lower extremity edema. ? ?07/17/21: Patient has been admitted with COPD exacerbation along with acute hypoxemic respiratory failure and is currently on 4 L nasal cannula oxygen.  She continues to have significant amounts of cough. ? ?07/18/21: Patient continues to have significant amounts of cough and ongoing wheezing with COPD exacerbation.  Continue IV steroids and breathing treatments as prescribed.  Pulmonology consulted for assistance.  ? ? ?Assessment & Plan: ?  ?Principal Problem: ?  COPD exacerbation ([place]) ?Active Problems: ?  Acute respiratory failure with hypoxia ([place]) ?  Hypokalemia ?  Paroxysmal atrial fibrillation (HCC) ?  Chronic diastolic CHF (congestive heart failure) ([place]) ?  Anxiety and depression ? ?Assessment and Plan: ? ? ?COPD exacerbation ([place]) ?- The patient be admitted to a medical telemetry bed. ?- We will continue steroid therapy with IV Solu-Medrol. ?- We will continue bronchodilator therapy with DuoNebs 4 times daily  ?- Mucolytic therapy will be provided. ?- We will add IV antibiotic therapy with Rocephin and  Zithromax ?-Appreciate pulmonology evaluation ?  ?  ?Acute respiratory failure with hypoxia ([place]) ?- This is secondary to #1. ?- O2 protocol will be provided. ?  ?Chronic diastolic CHF (congestive heart failure) ([place]) ?We will continue diuresis with PO Lasix on 3/18 ?  ?Paroxysmal atrial fibrillation (HCC) ?- We will continue Toprol-XL and Rythmol. ?- We will continue Coumadin once INR decreases; per pharmacy ?  ?Anxiety and depression ?- We will continue Zoloft and Valium. ?  ?  ?  ?DVT prophylaxis:Coumadin per pharmacy ?Code Status: Full ?Family Communication: None at bedside ?Disposition Plan:  ?Status is: Inpatient ?Remains inpatient appropriate because: Requires IV medications and scheduled breathing treatments. ?  ?  ?Consultants:  ?None ?  ?Procedures:  ?See below ?  ?Antimicrobials:  ?Anti-infectives (From admission, onward)  ? ? Start     Dose/Rate Route Frequency Ordered Stop  ? 07/18/21 1030  azithromycin (ZITHROMAX) tablet 500 mg       ? 500 mg Oral Daily 07/18/21 0937 07/21/21 0959  ? 07/16/21 2300  cefTRIAXone (ROCEPHIN) 1 g in sodium chloride 0.9 % 100 mL IVPB       ? 1 g ?200 mL/hr over 30 Minutes Intravenous Every 24 hours 07/16/21 2242 07/21/21 2259  ? 07/16/21 2145  azithromycin (ZITHROMAX) 500 mg in sodium chloride 0.9 % 250 mL IVPB       ? 500 mg ?250 mL/hr over 60 Minutes Intravenous  Once 07/16/21 2135 07/16/21 2259  ? ?  ? ? ?Subjective: ?Patient seen and evaluated today with ongoing shortness of breath and cough with no improvement noted overnight.  She is currently receiving  a breathing treatment. ? ?Objective: ?Vitals:  ? 07/17/21 2210 07/18/21 0134 07/18/21 7858 07/18/21 0804  ?BP: (!) 158/71  (!) 121/56   ?Pulse: 71  (!) 54   ?Resp: 18  17   ?Temp: 97.8 ?F (36.6 ?C)  97.6 ?F (36.4 ?C)   ?TempSrc:      ?SpO2: 100% 98% 100% 99%  ?Weight:      ?Height:      ? ? ?Intake/Output Summary (Last 24 hours) at 07/18/2021 0936 ?Last data filed at 07/18/2021 0500 ?Gross per 24 hour  ?Intake 630 ml   ?Output --  ?Net 630 ml  ? ?Filed Weights  ? 07/16/21 1622 07/17/21 0236  ?Weight: 43.1 kg 43.5 kg  ? ? ?Examination: ? ?General exam: Appears calm and comfortable  ?Respiratory system: Bilateral wheezing with O2 supplementation and breathing treatment ongoing ?Cardiovascular system: S1 & S2 heard, RRR.  ?Gastrointestinal system: Abdomen is soft ?Central nervous system: Alert and awake ?Extremities: No edema ?Skin: No significant lesions noted ?Psychiatry: Flat affect. ? ? ? ?Data Reviewed: I have personally reviewed following labs and imaging studies ? ?CBC: ?Recent Labs  ?Lab 07/16/21 ?1720 07/17/21 ?8502 07/18/21 ?0552  ?WBC 24.0* 11.6* 14.9*  ?NEUTROABS 19.1*  --   --   ?HGB 10.2* 9.1* 8.9*  ?HCT 32.3* 29.9* 28.5*  ?MCV 90.2 92.9 95.3  ?PLT 364 238 229  ? ?Basic Metabolic Panel: ?Recent Labs  ?Lab 07/16/21 ?1644 07/16/21 ?1720 07/17/21 ?7741 07/18/21 ?0552  ?NA  --  135 137 138  ?K  --  3.1* 3.3* 3.8  ?CL  --  93* 96* 99  ?CO2  --  '29 31 31  '$ ?GLUCOSE  --  127* 157* 127*  ?BUN  --  29* 23* 26*  ?CREATININE  --  1.15* 0.97 1.00  ?CALCIUM  --  9.0 8.3* 8.5*  ?MG 2.6*  --   --  2.5*  ? ?GFR: ?Estimated Creatinine Clearance: 41.6 mL/min (by C-G formula based on SCr of 1 mg/dL). ?Liver Function Tests: ?Recent Labs  ?Lab 07/16/21 ?1720  ?AST 56*  ?ALT 25  ?ALKPHOS 62  ?BILITOT 0.8  ?PROT 7.4  ?ALBUMIN 3.9  ? ?No results for input(s): LIPASE, AMYLASE in the last 168 hours. ?No results for input(s): AMMONIA in the last 168 hours. ?Coagulation Profile: ?Recent Labs  ?Lab 07/16/21 ?1644 07/17/21 ?1109 07/18/21 ?0552  ?INR 4.0* 5.0* 4.0*  ? ?Cardiac Enzymes: ?No results for input(s): CKTOTAL, CKMB, CKMBINDEX, TROPONINI in the last 168 hours. ?BNP (last 3 results) ?No results for input(s): PROBNP in the last 8760 hours. ?HbA1C: ?No results for input(s): HGBA1C in the last 72 hours. ?CBG: ?No results for input(s): GLUCAP in the last 168 hours. ?Lipid Profile: ?No results for input(s): CHOL, HDL, LDLCALC, TRIG, CHOLHDL,  LDLDIRECT in the last 72 hours. ?Thyroid Function Tests: ?No results for input(s): TSH, T4TOTAL, FREET4, T3FREE, THYROIDAB in the last 72 hours. ?Anemia Panel: ?No results for input(s): VITAMINB12, FOLATE, FERRITIN, TIBC, IRON, RETICCTPCT in the last 72 hours. ?Sepsis Labs: ?No results for input(s): PROCALCITON, LATICACIDVEN in the last 168 hours. ? ?Recent Results (from the past 240 hour(s))  ?Resp Panel by RT-PCR (Flu A&B, Covid) Nasopharyngeal Swab     Status: None  ? Collection Time: 07/16/21  5:17 PM  ? Specimen: Nasopharyngeal Swab; Nasopharyngeal(NP) swabs in vial transport medium  ?Result Value Ref Range Status  ? SARS Coronavirus 2 by RT PCR NEGATIVE NEGATIVE Final  ?  Comment: (NOTE) ?SARS-CoV-2 target nucleic acids are NOT DETECTED. ? ?  The SARS-CoV-2 RNA is generally detectable in upper respiratory ?specimens during the acute phase of infection. The lowest ?concentration of SARS-CoV-2 viral copies this assay can detect is ?138 copies/mL. A negative result does not preclude SARS-Cov-2 ?infection and should not be used as the sole basis for treatment or ?other patient management decisions. A negative result may occur with  ?improper specimen collection/handling, submission of specimen other ?than nasopharyngeal swab, presence of viral mutation(s) within the ?areas targeted by this assay, and inadequate number of viral ?copies(<138 copies/mL). A negative result must be combined with ?clinical observations, patient history, and epidemiological ?information. The expected result is Negative. ? ?Fact Sheet for Patients:  ?EntrepreneurPulse.com.au ? ?Fact Sheet for Healthcare Providers:  ?IncredibleEmployment.be ? ?This test is no t yet approved or cleared by the [place] FDA and  ?has been authorized for detection and/or diagnosis of SARS-CoV-2 by ?FDA under an Emergency Use Authorization (EUA). This EUA will remain  ?in effect (meaning this test can be used) for the  duration of the ?COVID-19 declaration under Section 564(b)(1) of the Act, 21 ?U.S.C.section 360bbb-3(b)(1), unless the authorization is terminated  ?or revoked sooner.  ? ? ?  ? Influenza A by PCR NEGATIVE NEGATIVE Final  ?

## 2021-07-18 NOTE — Progress Notes (Signed)
ANTICOAGULATION CONSULT NOTE -  ? ?Pharmacy Consult for Coumadin ?Indication: atrial fibrillation ? ?Allergies  ?Allergen Reactions  ? Amiodarone Nausea Only  ? Penicillins Hives  ?  Has patient had a PCN reaction causing immediate rash, facial/tongue/throat swelling, SOB or lightheadedness with hypotension: YES ?Has patient had a PCN reaction causing severe rash involving mucus membranes or skin necrosis: NO ?Has patient had a PCN reaction that required hospitalization NO ?Has patient had a PCN reaction occurring within the last 10 years: NO ?If all of the above answers are "NO", then may proceed with Cephalosporin use. ?  ? ? ?Patient Measurements: ?Height: '5\' 4"'$  (162.6 cm) ?Weight: 43.5 kg (95 lb 14.4 oz) ?IBW/kg (Calculated) : 54.7 ? ?Vital Signs: ?Temp: 97.6 ?F (36.4 ?C) (03/17 7096) ?BP: 121/56 (03/17 0512) ?Pulse Rate: 54 (03/17 0512) ? ?Labs: ?Recent Labs  ?  07/16/21 ?2836 07/16/21 ?1720 07/16/21 ?1720 07/17/21 ?6294 07/17/21 ?1109 07/18/21 ?0552  ?HGB  --  10.2*   < > 9.1*  --  8.9*  ?HCT  --  32.3*  --  29.9*  --  28.5*  ?PLT  --  364  --  238  --  229  ?LABPROT 39.2*  --   --   --  46.2* 38.8*  ?INR 4.0*  --   --   --  5.0* 4.0*  ?CREATININE  --  1.15*  --  0.97  --  1.00  ? < > = values in this interval not displayed.  ? ? ? ?Estimated Creatinine Clearance: 41.6 mL/min (by C-G formula based on SCr of 1 mg/dL). ? ? ?Medical History: ?Past Medical History:  ?Diagnosis Date  ? Aortic stenosis   ? ATRIAL ARRHYTHMIAS   ? Bicuspid aortic valve   ? CHF (congestive heart failure) ([place])   ? COARCTATION OF AORTA   ? ENDOMETRIOSIS   ? Heart murmur   ? Paroxysmal atrial fibrillation (HCC)   ? Shortness of breath   ? THORACIC AORTIC ANEURYSM   ? ? ?Medications:  ?Medications Prior to Admission  ?Medication Sig Dispense Refill Last Dose  ? aspirin EC 81 MG tablet Take 81 mg by mouth daily.   07/15/2021  ? benzonatate (TESSALON PERLES) 100 MG capsule Take 1 capsule (100 mg total) by mouth 3 (three) times daily as  needed for cough. 30 capsule 1 07/15/2021  ? diazepam (VALIUM) 10 MG tablet Take 10 mg by mouth daily as needed.   Past Week  ? furosemide (LASIX) 40 MG tablet Take 80 mg by mouth daily.   07/15/2021  ? gabapentin (NEURONTIN) 300 MG capsule Take 300 mg by mouth See admin instructions. Take 300 mg in the morning, 300 mg in the afternoon   600 mg at bedtime   07/15/2021  ? HYDROcodone-acetaminophen (NORCO/VICODIN) 5-325 MG tablet Take 2 tablets by mouth 2 (two) times daily.   07/15/2021  ? metoprolol succinate (TOPROL-XL) 25 MG 24 hr tablet TAKE 1 TABLET BY MOUTH ONCE DAILY. 90 tablet 3 07/15/2021 at AM  ? Multiple Vitamin (MULTI-VITAMINS) TABS Take 1 tablet by mouth daily.    Past Week  ? NEXLETOL 180 MG TABS Take 1 tablet by mouth daily.   07/15/2021  ? ondansetron (ZOFRAN ODT) 4 MG disintegrating tablet Take 1 tablet (4 mg total) by mouth every 8 (eight) hours as needed for nausea or vomiting. 20 tablet 0 unknown  ? potassium chloride SA (KLOR-CON) 20 MEQ tablet TAKE (1) TABLET BY MOUTH ONCE DAILY. (Patient taking differently: Take 20 mEq  by mouth daily.) 90 tablet 1 07/15/2021  ? propafenone (RYTHMOL) 300 MG tablet TAKE 1 TABLET BY MOUTH THREE TIMES A DAY. 90 tablet 6 07/16/2021  ? sertraline (ZOLOFT) 100 MG tablet Take 100 mg by mouth daily.   07/15/2021  ? Turmeric 500 MG CAPS Take 500 mg by mouth 3 (three) times a week.   Past Week  ? warfarin (COUMADIN) 1 MG tablet TAKE 3 TABLETS DAILY, OR AS DIRECTED. (Patient taking differently: TAKE 2 TABLETS DAILY, OR AS DIRECTED.) 90 tablet 0 07/15/2021 at 2200  ? diazepam (VALIUM) 5 MG tablet Take 1 tablet (5 mg total) by mouth daily as needed for anxiety. (Patient not taking: Reported on 07/16/2021) 30 tablet 0 Not Taking  ? traMADol (ULTRAM) 50 MG tablet Take 1 tablet (50 mg total) by mouth 3 (three) times daily. (Patient not taking: Reported on 07/16/2021) 90 tablet 3 Not Taking  ? ? ?Assessment: ?Patient on chronic anticoagulation with Coumadin for afib. Followed in anticoag  clinic and last visit on 07/01/21 her dose changed to Coumadin '2mg'$  daily. ? ?INR 5.0 > 4.0- supratherapeutic ?Hgb 8.9 ? ?Goal of Therapy:  ?INR 2-3 ?Monitor platelets by anticoagulation protocol: Yes ?  ?Plan:  ?No coumadin today ?PT-INR daily ?Monitor for S/S of bleeding ? ?Margot Ables, PharmD ?Clinical Pharmacist ?07/18/2021 8:11 AM ? ? ? ?

## 2021-07-18 NOTE — Progress Notes (Signed)
?  Transition of Care (TOC) Screening Note ? ? ?Patient Details  ?Name: Mallory Sutton ?Date of Birth: [birth year] ? ? ?Transition of Care (TOC) CM/SW Contact:    ?Braelynne Garinger D, LCSW ?Phone Number: ?07/18/2021, 3:52 PM ? ? ? ?Transition of Care Department [facility]) has reviewed patient and no TOC needs have been identified at this time. We will continue to monitor patient advancement through interdisciplinary progression rounds. If new patient transition needs arise, please place a TOC consult. ?  ?Esley Brooking, Clydene Pugh, LCSW  ?

## 2021-07-18 NOTE — Consult Note (Signed)
? ?NAME:  Mallory Sutton, MRN:  [id number], DOB:  07/25/1960, LOS: 2 ?ADMISSION DATE:  07/16/2021, CONSULTATION DATE:  07/18/21 ?REFERRING MD:  Manuella Ghazi, [facility], CHIEF COMPLAINT:  cough > sob   ? ?History of Present Illness:  ?75 yowf never smoker /lots of passive exp with medical history significant for paroxysmal atrial fibrillation on Coumadin, anxiety and depression, thoracic aortic aneurysm, CHF and coarctation of the aorta, who presented to the emergency room with acute onset of worsening dyspnea with associated congested cough with inability to expectorate as well as wheezing since yesterday.  She admits to nausea without vomiting.  She denies any fever or chills.  She has been having chest pain only with cough.  No dysuria, oliguria or hematuria, urgency or frequency or flank pain.  No leg pain or edema or recent travels or surgeries. She denies any orthopnea or proximal nocturnal dyspnea or worsening lower extremity edema. ?  ?ED Course: Upon presentation to the emergency room, vital signs were within normal except for oximetry of 85% on room air. Labs revealed hypokalemia of 3.1 and hypochloremia of 93 with a BUN of 29 and creatinine 1.15, ALT 56 and BNP was 1150.   ?EKG as reviewed by me : EKG showed normal sinus rhythm with a rate of 69 and PACs, left intrafascicular block and LVH with repolarization abnormality and anterior Q waves. ?Imaging: Portable chest ray showed emphysema with no cardiopulmonary disease. ? ?The patient was given 125 mg of IV Solu-Medrol, 1 L bolus of IV normal saline and nebulized albuterol.  She will be admitted to a medical telemetry bed for further evaluation and management. ? ?  ? ?Significant Hospital Events: ?Including procedures, antibiotic start and stop dates in addition to other pertinent events   ? RVP  3/15 neg  for covid/ Flu ? ? ?Scheduled Meds: ? aspirin EC  81 mg Oral Daily  ? azithromycin  500 mg Oral Daily  ? Bempedoic Acid  1 tablet Oral Daily  ? benzonatate  100 mg Oral  TID  ? budesonide (PULMICORT) nebulizer solution  0.25 mg Nebulization BID  ? feeding supplement  237 mL Oral BID BM  ? [START ON 07/19/2021] furosemide  80 mg Oral Daily  ? gabapentin  300 mg Oral BID  ? And  ? gabapentin  600 mg Oral QHS  ? guaiFENesin  600 mg Oral BID  ? HYDROcodone-acetaminophen  2 tablet Oral BID  ? ipratropium-albuterol  3 mL Nebulization Q6H  ? methylPREDNISolone (SOLU-MEDROL) injection  40 mg Intravenous Q12H  ? [START ON 07/19/2021] metoprolol succinate  25 mg Oral Daily  ? multivitamin with minerals  1 tablet Oral Daily  ? potassium chloride  40 mEq Oral Once  ? propafenone  300 mg Oral TID  ? sertraline  100 mg Oral Daily  ? ?Continuous Infusions: ? cefTRIAXone (ROCEPHIN)  IV 1 g (07/18/21 0013)  ? ?PRN Meds:.acetaminophen **OR** acetaminophen, chlorpheniramine-HYDROcodone, diazepam, guaiFENesin-dextromethorphan, hydrALAZINE, magnesium hydroxide, ondansetron **OR** ondansetron (ZOFRAN) IV, traZODone  ? ? ? ?Interim History / Subjective:  ?Very congested cough, generalized ant chest discomfort with coughing  ? ?Objective   ?Blood pressure (!) 121/56, pulse (!) 54, temperature 97.6 ?F (36.4 ?C), resp. rate 17, height '5\' 4"'$  (1.626 m), weight 43.5 kg, last menstrual period 07/16/2013, SpO2 99 %. ?   ?   ? ?Intake/Output Summary (Last 24 hours) at 07/18/2021 1222 ?Last data filed at 07/18/2021 0500 ?Gross per 24 hour  ?Intake 630 ml  ?Output --  ?Net 630  ml  ? ?Filed Weights  ? 07/16/21 1622 07/17/21 0236  ?Weight: 43.1 kg 43.5 kg  ? ? ?Examination: ?Tmax  97.8  ?General appearance:    late middle aged wf sitting up in bed with rattling cough   ?At Rest 02 sats  99% on 2lpm   ?No jvd ?Oropharynx clear,  mucosa nl ?Neck supple ?Lungs with junky insp/exp rhonchi bilaterally  ?RRR no s3 or or sign murmur ?Abd soft with nl  excursion  ?Extr warm with trace edema or clubbing noted ?Neuro  Sensorium intact ,  no apparent motor deficits  ?  ? ? ?I personally reviewed images and agree with radiology  impression as follows:  ?CXR:   portable 07/16/21  ?Emphysema without acute cardiopulmonary findings. ? 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 ? ? ?2) Acute hypoxemic resp failure secondary to #1 plus elevated bnp with  mild valvular heart dz with LAE > RAE ? With cardiac asthma  asthma component?  ?>>> keep sats > 90% , walk on RA prior to d/c to determine outpt needs and f/u in office as above  ?Best Practice (right click and "Reselect all SmartList Selections" daily)  ? ?Per [facility] ? ?Labs   ?CBC: ?Recent Labs  ?Lab 07/16/21 ?1720 07/17/21 ?9735 07/18/21 ?0552  ?WBC 24.0* 11.6* 14.9*  ?NEUTROABS 19.1*  --   --   ?HGB 10.2* 9.1* 8.9*  ?HCT 32.3* 29.9* 28.5*  ?MCV 90.2 92.9 95.3  ?PLT 364 238 229  ? ? ?Basic Metabolic Panel: ?Recent Labs  ?Lab 07/16/21 ?1644 07/16/21 ?1720 07/17/21 ?3299 07/18/21 ?0552  ?NA  --  135 137 138  ?K  --  3.1* 3.3* 3.8  ?CL  --  93* 96* 99  ?CO2  --  '29 31 31  '$ ?GLUCOSE  --  127* 157* 127*  ?BUN  --  29* 23* 26*  ?CREATININE  --  1.15* 0.97 1.00  ?CALCIUM  --  9.0 8.3* 8.5*  ?MG 2.6*  --   --  2.5*  ? ?GFR: ?Estimated Creatinine Clearance: 41.6 mL/min (by C-G formula based on SCr of 1 mg/dL). ?Recent Labs  ?Lab 07/16/21 ?1720 07/17/21 ?2426 07/18/21 ?0552  ?WBC 24.0* 11.6* 14.9*  ? ? ?Liver Function Tests: ?Recent Labs  ?Lab 07/16/21 ?1720  ?AST 56*  ?ALT 25  ?ALKPHOS 62  ?BILITOT 0.8  ?PROT 7.4  ?ALBUMIN 3.9  ? ?No results for input(s): LIPASE, AMYLASE in the last 168 hours. ?No results for input(s): AMMONIA in the last 168 hours. ? ?ABG ?No results found  for: PHART, PCO2ART, PO2ART, HCO3, TCO2, ACIDBASEDEF, O2SAT  ? ?Coagulation Profile: ?Recent Labs  ?Lab 07/16/21 ?1644 07/17/21 ?1109 07/18/21 ?0552  ?INR 4.0* 5.0* 4.0*  ? ? ?Cardiac Enzymes: ?No results for input(s): CKTOTAL, CKMB, CKMBINDEX, TROPONINI in the last 168 hours. ? ?HbA1C: ?No results found for: HGBA1C ? ?CBG: ?No results for input(s): GLUCAP in the last 168 hours. ? ?  ? ?Past Medical History:  ?She,  has a  past medical history of Aortic stenosis, ATRIAL ARRHYTHMIAS, Bicuspid aortic valve, CHF (congestive heart failure) ([place]), COARCTATION OF AORTA, ENDOMETRIOSIS, Heart murmur, Paroxysmal atrial fibrillation ([place]), Shortness of breath, and THORACIC AORTIC ANEURYSM.  ? ?Surgical History:  ? ?Past Surgical History:  ?Procedure Laterality Date  ? CARDIOVERSION N/A 08/29/2019  ? Procedure: CARDIOVERSION;  Surgeon: Arnoldo Lenis, MD;  Location: [facility];  Service: Endoscopy;  Laterality: N/A;  ? coarctation repair and residual restenosis    ? KNEE SURGERY    ? TEE WITHOUT CARDIOVERSION N/A 07/12/2019  ? Procedure: TRANSESOPHAGEAL ECHOCARDIOGRAM (TEE);  Surgeon: Sueanne Margarita, MD;  Location: [facility] ENDOSCOPY;  Service: Cardiovascular;  Laterality: N/A;  ? TEE WITHOUT CARDIOVERSION N/A 08/29/2019  ? Procedure: TRANSESOPHAGEAL ECHOCARDIOGRAM (TEE) WITH PROPOFOL;  Surgeon: Arnoldo Lenis, MD;  Location: [facility];  Service: Endoscopy;  Laterality: N/A;  ?  ? ?Social History:  ? reports that she has never smoked. She has never used smokeless tobacco. She reports current alcohol use. She reports that she does not use drugs.  ? ?Family History:  ?Her family history includes Drug abuse in her brother; Healthy in her daughter and son; Mental illness in her mother; Suicidality in her mother.  ? ?Allergies ?Allergies  ?Allergen Reactions  ? Amiodarone Nausea Only  ? Penicillins Hives  ?  Has patient had a PCN reaction causing immediate rash, facial/tongue/throat swelling, SOB or lightheadedness with hypotension:  YES ?Has patient had a PCN reaction causing severe rash involving mucus membranes or skin necrosis: NO ?Has patient had a PCN reaction that required hospitalization NO ?Has patient had a PCN reaction occurring wi

## 2021-07-18 NOTE — Progress Notes (Signed)
Patient is breathing much better post treatments today. Patient states she also feels better post med changes. Tolerating her diet well. No complaints of pain this evening. ?

## 2021-07-19 DIAGNOSIS — E440 Moderate protein-calorie malnutrition: Secondary | ICD-10-CM | POA: Insufficient documentation

## 2021-07-19 DIAGNOSIS — J9601 Acute respiratory failure with hypoxia: Secondary | ICD-10-CM | POA: Diagnosis not present

## 2021-07-19 DIAGNOSIS — E43 Unspecified severe protein-calorie malnutrition: Secondary | ICD-10-CM | POA: Insufficient documentation

## 2021-07-19 DIAGNOSIS — I5033 Acute on chronic diastolic (congestive) heart failure: Secondary | ICD-10-CM

## 2021-07-19 DIAGNOSIS — J441 Chronic obstructive pulmonary disease with (acute) exacerbation: Secondary | ICD-10-CM | POA: Diagnosis not present

## 2021-07-19 LAB — BASIC METABOLIC PANEL
Anion gap: 13 (ref 5–15)
Anion gap: 11 (ref 5–15)
BUN: 23 mg/dL — ABNORMAL HIGH (ref 6–20)
BUN: 23 mg/dL — ABNORMAL HIGH (ref 6–20)
CO2: 32 mmol/L (ref 22–32)
CO2: 34 mmol/L — ABNORMAL HIGH (ref 22–32)
Calcium: 8.9 mg/dL (ref 8.9–10.3)
Calcium: 9.1 mg/dL (ref 8.9–10.3)
Chloride: 97 mmol/L — ABNORMAL LOW (ref 98–111)
Chloride: 95 mmol/L — ABNORMAL LOW (ref 98–111)
Creatinine, Ser: 0.83 mg/dL (ref 0.44–1.00)
Creatinine, Ser: 0.99 mg/dL (ref 0.44–1.00)
GFR, Estimated: >60 mL/min (ref >60)
GFR, Estimated: >60 mL/min (ref >60)
Glucose, Bld: 118 mg/dL — ABNORMAL HIGH (ref 70–99)
Glucose, Bld: 102 mg/dL — ABNORMAL HIGH (ref 70–99)
Potassium: 4 mmol/L (ref 3.5–5.1)
Potassium: 3.6 mmol/L (ref 3.5–5.1)
Sodium: 142 mmol/L (ref 135–145)
Sodium: 140 mmol/L (ref 135–145)

## 2021-07-19 LAB — PROTIME-INR
INR: 2.5 — ABNORMAL HIGH (ref 0.8–1.2)
Prothrombin Time: 27.1 seconds — ABNORMAL HIGH (ref 11.4–15.2)

## 2021-07-19 LAB — CBC
HCT: 30.3 % — ABNORMAL LOW (ref 36.0–46.0)
Hemoglobin: 9.4 g/dL — ABNORMAL LOW (ref 12.0–15.0)
MCH: 29.7 pg (ref 26.0–34.0)
MCHC: 31 g/dL (ref 30.0–36.0)
MCV: 95.9 fL (ref 80.0–100.0)
Platelets: 225 10*3/uL (ref 150–400)
RBC: 3.16 MIL/uL — ABNORMAL LOW (ref 3.87–5.11)
RDW: 14.9 % (ref 11.5–15.5)
WBC: 11.7 10*3/uL — ABNORMAL HIGH (ref 4.0–10.5)
nRBC: 0 % (ref 0.0–0.2)

## 2021-07-19 MED ORDER — ALBUTEROL SULFATE (2.5 MG/3ML) 0.083% IN NEBU: 2.5000 mg | INHALATION_SOLUTION | Freq: Four times a day (QID) | RESPIRATORY_TRACT | Status: DC

## 2021-07-19 MED ORDER — WARFARIN - PHARMACIST DOSING INPATIENT: Freq: Every day | Status: DC

## 2021-07-19 MED ORDER — DIAZEPAM 5 MG PO TABS
2.5000 mg | ORAL_TABLET | Freq: Once | ORAL | Status: AC
  Administered 2021-07-19: 2.5 mg via ORAL
  Filled 2021-07-19: qty 1

## 2021-07-19 MED ORDER — WARFARIN SODIUM 2 MG PO TABS
2.0000 mg | ORAL_TABLET | Freq: Once | ORAL | Status: AC
  Administered 2021-07-19: 2 mg via ORAL
  Filled 2021-07-19: qty 1

## 2021-07-19 MED ORDER — IPRATROPIUM-ALBUTEROL 0.5-2.5 (3) MG/3ML IN SOLN
3.0000 mL | Freq: Four times a day (QID) | RESPIRATORY_TRACT | Status: DC
  Administered 2021-07-19 – 2021-07-22 (×10): 3 mL via RESPIRATORY_TRACT
  Filled 2021-07-19 (×11): qty 3

## 2021-07-19 MED ORDER — IPRATROPIUM BROMIDE 0.02 % IN SOLN: 0.5000 mg | Freq: Four times a day (QID) | RESPIRATORY_TRACT | Status: DC

## 2021-07-19 NOTE — Assessment & Plan Note (Addendum)
-  Patient advised to use feeding supplements in between meals. ?

## 2021-07-19 NOTE — Progress Notes (Signed)
ANTICOAGULATION CONSULT NOTE -  ? ?Pharmacy Consult for Coumadin ?Indication: atrial fibrillation ? ?Allergies  ?Allergen Reactions  ? Amiodarone Nausea Only  ? Penicillins Hives  ?  Has patient had a PCN reaction causing immediate rash, facial/tongue/throat swelling, SOB or lightheadedness with hypotension: YES ?Has patient had a PCN reaction causing severe rash involving mucus membranes or skin necrosis: NO ?Has patient had a PCN reaction that required hospitalization NO ?Has patient had a PCN reaction occurring within the last 10 years: NO ?If all of the above answers are "NO", then may proceed with Cephalosporin use. ?  ? ? ?Patient Measurements: ?Height: '5\' 4"'$  (162.6 cm) ?Weight: 43.5 kg (95 lb 14.4 oz) ?IBW/kg (Calculated) : 54.7 ? ?Vital Signs: ?Temp: 97.6 ?F (36.4 ?C) (03/18 0522) ?BP: 157/97 (03/18 0522) ?Pulse Rate: 60 (03/18 0522) ? ?Labs: ?Recent Labs  ?  07/17/21 ?7654 07/17/21 ?1109 07/18/21 ?6503 07/19/21 ?0512  ?HGB 9.1*  --  8.9* 9.4*  ?HCT 29.9*  --  28.5* 30.3*  ?PLT 238  --  229 225  ?LABPROT  --  46.2* 38.8* 27.1*  ?INR  --  5.0* 4.0* 2.5*  ?CREATININE 0.97  --  1.00 0.83  ? ? ? ?Estimated Creatinine Clearance: 50.1 mL/min (by C-G formula based on SCr of 0.83 mg/dL). ? ? ?Medical History: ?Past Medical History:  ?Diagnosis Date  ? Aortic stenosis   ? ATRIAL ARRHYTHMIAS   ? Bicuspid aortic valve   ? CHF (congestive heart failure) ([place])   ? COARCTATION OF AORTA   ? ENDOMETRIOSIS   ? Heart murmur   ? Paroxysmal atrial fibrillation (HCC)   ? Shortness of breath   ? THORACIC AORTIC ANEURYSM   ? ? ?Medications:  ?Medications Prior to Admission  ?Medication Sig Dispense Refill Last Dose  ? aspirin EC 81 MG tablet Take 81 mg by mouth daily.   07/15/2021  ? benzonatate (TESSALON PERLES) 100 MG capsule Take 1 capsule (100 mg total) by mouth 3 (three) times daily as needed for cough. 30 capsule 1 07/15/2021  ? diazepam (VALIUM) 10 MG tablet Take 10 mg by mouth daily as needed.   Past Week  ? furosemide  (LASIX) 40 MG tablet Take 80 mg by mouth daily.   07/15/2021  ? gabapentin (NEURONTIN) 300 MG capsule Take 300 mg by mouth See admin instructions. Take 300 mg in the morning, 300 mg in the afternoon   600 mg at bedtime   07/15/2021  ? HYDROcodone-acetaminophen (NORCO/VICODIN) 5-325 MG tablet Take 2 tablets by mouth 2 (two) times daily.   07/15/2021  ? metoprolol succinate (TOPROL-XL) 25 MG 24 hr tablet TAKE 1 TABLET BY MOUTH ONCE DAILY. 90 tablet 3 07/15/2021 at AM  ? Multiple Vitamin (MULTI-VITAMINS) TABS Take 1 tablet by mouth daily.    Past Week  ? NEXLETOL 180 MG TABS Take 1 tablet by mouth daily.   07/15/2021  ? ondansetron (ZOFRAN ODT) 4 MG disintegrating tablet Take 1 tablet (4 mg total) by mouth every 8 (eight) hours as needed for nausea or vomiting. 20 tablet 0 unknown  ? potassium chloride SA (KLOR-CON) 20 MEQ tablet TAKE (1) TABLET BY MOUTH ONCE DAILY. (Patient taking differently: Take 20 mEq by mouth daily.) 90 tablet 1 07/15/2021  ? propafenone (RYTHMOL) 300 MG tablet TAKE 1 TABLET BY MOUTH THREE TIMES A DAY. 90 tablet 6 07/16/2021  ? sertraline (ZOLOFT) 100 MG tablet Take 100 mg by mouth daily.   07/15/2021  ? Turmeric 500 MG CAPS Take 500 mg  by mouth 3 (three) times a week.   Past Week  ? warfarin (COUMADIN) 1 MG tablet TAKE 3 TABLETS DAILY, OR AS DIRECTED. (Patient taking differently: TAKE 2 TABLETS DAILY, OR AS DIRECTED.) 90 tablet 0 07/15/2021 at 2200  ? diazepam (VALIUM) 5 MG tablet Take 1 tablet (5 mg total) by mouth daily as needed for anxiety. (Patient not taking: Reported on 07/16/2021) 30 tablet 0 Not Taking  ? traMADol (ULTRAM) 50 MG tablet Take 1 tablet (50 mg total) by mouth 3 (three) times daily. (Patient not taking: Reported on 07/16/2021) 90 tablet 3 Not Taking  ? ? ?Assessment: ?Patient on chronic anticoagulation with Coumadin for afib. Followed in anticoag clinic and last visit on 07/01/21 her dose changed to Coumadin '2mg'$  daily. ? ?INR 5.0 > 4.0> 2.5 ?Hgb 9.4 ? ?Goal of Therapy:  ?INR  2-3 ?Monitor platelets by anticoagulation protocol: Yes ?  ?Plan:  ?Warfarin 2 mg x 1 dose. ?PT-INR daily ?Monitor for S/S of bleeding ? ?Margot Ables, PharmD ?Clinical Pharmacist ?07/19/2021 7:47 AM ? ? ? ?

## 2021-07-19 NOTE — Progress Notes (Signed)
?  Progress Note ? ? ?Patient: Mallory Sutton MGN:[id number] DOB: [birth year] DOA: 07/16/2021     3 ?DOS: the patient was seen and examined on 07/19/2021 ?  ?Brief hospital course: ?Per HPI: ?MIRELLE BISKUP is a 61 y.o. Caucasian female with medical history significant for paroxysmal atrial fibrillation on Coumadin, anxiety and depression, thoracic aortic aneurysm, CHF and coarctation of the aorta, who presented to the emergency room with acute onset of worsening dyspnea with associated congested cough with inability to expectorate as well as wheezing since yesterday.  Has history of COPD without any home oxygen use.  She admits to nausea without vomiting.  She denies any fever or chills.  She has been having chest pain only with cough.  No dysuria, oliguria or hematuria, urgency or frequency or flank pain.  No leg pain or edema or recent travels or surgeries. She denies any orthopnea or proximal nocturnal dyspnea or worsening lower extremity edema. ? ?07/17/21: Patient has been admitted with COPD exacerbation along with acute hypoxemic respiratory failure and is currently on 4 L nasal cannula oxygen.  She continues to have significant amounts of cough. ? ?07/18/21: Patient continues to have significant amounts of cough and ongoing wheezing with COPD exacerbation.  Continue IV steroids and breathing treatments as prescribed.  Pulmonology consulted for assistance. ? ?Assessment and Plan: ?* COPD exacerbation ([place]) ?Still has some bronchospasm, nonproductive cough.  Continue Tussionex, continue IV steroids. ? ? ?Acute respiratory failure with hypoxia ([place]) ?Condition gradually improving, continue wean off oxygen. ? ?Hypokalemia ?Rechecking BMP tomorrow ? ?Acute on chronic diastolic CHF (congestive heart failure) ([place]) ?Patient initially received IV Lasix, condition has improved.  Currently on 80 mg oral Lasix.  Recheck a BNP tomorrow. ?Last echocardiogram was performed in 2021, repeat echocardiogram tomorrow. ? ?Paroxysmal  atrial fibrillation ([place]) ?Continue warfarin and Toprol XL, currently in sinus rhythm. ? ?Anxiety and depression ?- We will continue Zoloft and Valium. ? ?Malnutrition of moderate degree ?Start protein supplements ? ? ? ? ?  ? ?Subjective:  ?Patient still has syndrome short of breath with exertion, cough, with minimal production. ?No abdominal pain nausea vomiting. ? ?Physical Exam: ?Vitals:  ? 07/19/21 0522 07/19/21 0735 07/19/21 0739 07/19/21 1252  ?BP: (!) 157/97   (!) 162/79  ?Pulse: 60   (!) 58  ?Resp: 19   15  ?Temp: 97.6 ?F (36.4 ?C)     ?TempSrc:      ?SpO2: 100% 100% 100% 100%  ?Weight:      ?Height:      ? ?General exam: Appears calm and comfortable  ?Respiratory system: Decreased breath sounds. Respiratory effort normal. ?Cardiovascular system: S1 & S2 heard, RRR. No JVD, murmurs, rubs, gallops or clicks. No pedal edema. ?Gastrointestinal system: Abdomen is nondistended, soft and nontender. No organomegaly or masses felt. Normal bowel sounds heard. ?Central nervous system: Alert and oriented. No focal neurological deficits. ?Extremities: Symmetric 5 x 5 power. ?Skin: No rashes, lesions or ulcers ?Psychiatry: Judgement and insight appear normal. Mood & affect appropriate.  ? ?Data Reviewed: ?Reviewed all lab results. ? ?Family Communication:  ? ?Disposition: ?Status is: Inpatient ?Remains inpatient appropriate because: Severity of disease, IV treatment. ? Planned Discharge Destination: Home ? ? ? ?Time spent: 27 minutes ? ?Author: ?Sharen Hones, MD ?07/19/2021 1:36 PM ? ?For on call review [email or web address].  ?

## 2021-07-20 ENCOUNTER — Inpatient Hospital Stay (HOSPITAL_COMMUNITY): Payer: 59

## 2021-07-20 DIAGNOSIS — I5031 Acute diastolic (congestive) heart failure: Secondary | ICD-10-CM | POA: Diagnosis not present

## 2021-07-20 DIAGNOSIS — J441 Chronic obstructive pulmonary disease with (acute) exacerbation: Secondary | ICD-10-CM | POA: Diagnosis not present

## 2021-07-20 LAB — ECHOCARDIOGRAM COMPLETE
AR max vel: 0.62 cm2
AV Area VTI: 0.64 cm2
AV Area mean vel: 0.63 cm2
AV Mean grad: 29 mmHg
AV Peak grad: 50.7 mmHg
Ao pk vel: 3.56 m/s
Area-P 1/2: 3.99 cm2
Height: 64 in
MV M vel: 5.78 m/s
MV Peak grad: 133.6 mmHg
S' Lateral: 2.4 cm
Weight: 1534.4 oz

## 2021-07-20 LAB — CBC WITH DIFFERENTIAL/PLATELET
Abs Immature Granulocytes: 0.08 10*3/uL — ABNORMAL HIGH (ref 0.00–0.07)
Basophils Absolute: 0 10*3/uL (ref 0.0–0.1)
Basophils Relative: 0 %
Eosinophils Absolute: 0 10*3/uL (ref 0.0–0.5)
Eosinophils Relative: 0 %
HCT: 32.5 % — ABNORMAL LOW (ref 36.0–46.0)
Hemoglobin: 9.9 g/dL — ABNORMAL LOW (ref 12.0–15.0)
Immature Granulocytes: 1 %
Lymphocytes Relative: 19 %
Lymphs Abs: 1.8 10*3/uL (ref 0.7–4.0)
MCH: 29 pg (ref 26.0–34.0)
MCHC: 30.5 g/dL (ref 30.0–36.0)
MCV: 95.3 fL (ref 80.0–100.0)
Monocytes Absolute: 0.5 10*3/uL (ref 0.1–1.0)
Monocytes Relative: 6 %
Neutro Abs: 7.3 10*3/uL (ref 1.7–7.7)
Neutrophils Relative %: 74 %
Platelets: 239 10*3/uL (ref 150–400)
RBC: 3.41 MIL/uL — ABNORMAL LOW (ref 3.87–5.11)
RDW: 14.9 % (ref 11.5–15.5)
WBC: 9.7 10*3/uL (ref 4.0–10.5)
nRBC: 0 % (ref 0.0–0.2)

## 2021-07-20 LAB — MAGNESIUM: Magnesium: 2.8 mg/dL — ABNORMAL HIGH (ref 1.7–2.4)

## 2021-07-20 LAB — BRAIN NATRIURETIC PEPTIDE: B Natriuretic Peptide: 437 pg/mL — ABNORMAL HIGH (ref 0.0–100.0)

## 2021-07-20 LAB — PROTIME-INR
INR: 2 — ABNORMAL HIGH (ref 0.8–1.2)
Prothrombin Time: 22.7 seconds — ABNORMAL HIGH (ref 11.4–15.2)

## 2021-07-20 MED ORDER — MENTHOL 3 MG MT LOZG
1.0000 | LOZENGE | OROMUCOSAL | Status: DC | PRN
  Administered 2021-07-20 – 2021-07-21 (×2): 3 mg via ORAL
  Filled 2021-07-20 (×2): qty 9

## 2021-07-20 MED ORDER — BENZONATATE 100 MG PO CAPS
200.0000 mg | ORAL_CAPSULE | Freq: Three times a day (TID) | ORAL | Status: DC
Start: 2021-07-20 — End: 2021-07-22
  Administered 2021-07-20 – 2021-07-22 (×6): 200 mg via ORAL
  Filled 2021-07-20 (×6): qty 2

## 2021-07-20 MED ORDER — WARFARIN SODIUM 2 MG PO TABS
2.0000 mg | ORAL_TABLET | Freq: Once | ORAL | Status: AC
  Administered 2021-07-20: 2 mg via ORAL
  Filled 2021-07-20: qty 1

## 2021-07-20 MED ORDER — HYDROCOD POLI-CHLORPHE POLI ER 10-8 MG/5ML PO SUER: 5.0000 mL | Freq: Three times a day (TID) | ORAL | Status: DC | PRN

## 2021-07-20 MED ORDER — KETOROLAC TROMETHAMINE 30 MG/ML IJ SOLN
30.0000 mg | Freq: Four times a day (QID) | INTRAMUSCULAR | Status: DC | PRN
  Administered 2021-07-20 – 2021-07-21 (×2): 30 mg via INTRAVENOUS
  Filled 2021-07-20 (×2): qty 1

## 2021-07-20 MED ORDER — HYDROCOD POLI-CHLORPHE POLI ER 10-8 MG/5ML PO SUER
5.0000 mL | Freq: Two times a day (BID) | ORAL | Status: DC | PRN
  Administered 2021-07-20 – 2021-07-22 (×4): 5 mL via ORAL
  Filled 2021-07-20 (×4): qty 5

## 2021-07-20 NOTE — Progress Notes (Signed)
ANTICOAGULATION CONSULT NOTE -  ? ?Pharmacy Consult for Coumadin ?Indication: atrial fibrillation ? ?Allergies  ?Allergen Reactions  ? Amiodarone Nausea Only  ? Penicillins Hives  ?  Has patient had a PCN reaction causing immediate rash, facial/tongue/throat swelling, SOB or lightheadedness with hypotension: YES ?Has patient had a PCN reaction causing severe rash involving mucus membranes or skin necrosis: NO ?Has patient had a PCN reaction that required hospitalization NO ?Has patient had a PCN reaction occurring within the last 10 years: NO ?If all of the above answers are "NO", then may proceed with Cephalosporin use. ?  ? ? ?Patient Measurements: ?Height: '5\' 4"'$  (162.6 cm) ?Weight: 43.5 kg (95 lb 14.4 oz) ?IBW/kg (Calculated) : 54.7 ? ?Vital Signs: ?Temp: 98.2 ?F (36.8 ?C) (03/19 0534) ?Temp Source: Oral (03/19 0534) ?BP: 142/66 (03/19 0534) ?Pulse Rate: 71 (03/19 0534) ? ?Labs: ?Recent Labs  ?  07/18/21 ?8242 07/19/21 ?3536 07/19/21 ?1452 07/20/21 ?0805  ?HGB 8.9* 9.4*  --  9.9*  ?HCT 28.5* 30.3*  --  32.5*  ?PLT 229 225  --  239  ?LABPROT 38.8* 27.1*  --  22.7*  ?INR 4.0* 2.5*  --  2.0*  ?CREATININE 1.00 0.83 0.99  --   ? ? ? ?Estimated Creatinine Clearance: 42 mL/min (by C-G formula based on SCr of 0.99 mg/dL). ? ? ?Medical History: ?Past Medical History:  ?Diagnosis Date  ? Aortic stenosis   ? ATRIAL ARRHYTHMIAS   ? Bicuspid aortic valve   ? CHF (congestive heart failure) ([place])   ? COARCTATION OF AORTA   ? ENDOMETRIOSIS   ? Heart murmur   ? Paroxysmal atrial fibrillation (HCC)   ? Shortness of breath   ? THORACIC AORTIC ANEURYSM   ? ? ?Medications:  ?Medications Prior to Admission  ?Medication Sig Dispense Refill Last Dose  ? aspirin EC 81 MG tablet Take 81 mg by mouth daily.   07/15/2021  ? benzonatate (TESSALON PERLES) 100 MG capsule Take 1 capsule (100 mg total) by mouth 3 (three) times daily as needed for cough. 30 capsule 1 07/15/2021  ? diazepam (VALIUM) 10 MG tablet Take 10 mg by mouth daily as needed.    Past Week  ? furosemide (LASIX) 40 MG tablet Take 80 mg by mouth daily.   07/15/2021  ? gabapentin (NEURONTIN) 300 MG capsule Take 300 mg by mouth See admin instructions. Take 300 mg in the morning, 300 mg in the afternoon   600 mg at bedtime   07/15/2021  ? HYDROcodone-acetaminophen (NORCO/VICODIN) 5-325 MG tablet Take 2 tablets by mouth 2 (two) times daily.   07/15/2021  ? metoprolol succinate (TOPROL-XL) 25 MG 24 hr tablet TAKE 1 TABLET BY MOUTH ONCE DAILY. 90 tablet 3 07/15/2021 at AM  ? Multiple Vitamin (MULTI-VITAMINS) TABS Take 1 tablet by mouth daily.    Past Week  ? NEXLETOL 180 MG TABS Take 1 tablet by mouth daily.   07/15/2021  ? ondansetron (ZOFRAN ODT) 4 MG disintegrating tablet Take 1 tablet (4 mg total) by mouth every 8 (eight) hours as needed for nausea or vomiting. 20 tablet 0 unknown  ? potassium chloride SA (KLOR-CON) 20 MEQ tablet TAKE (1) TABLET BY MOUTH ONCE DAILY. (Patient taking differently: Take 20 mEq by mouth daily.) 90 tablet 1 07/15/2021  ? propafenone (RYTHMOL) 300 MG tablet TAKE 1 TABLET BY MOUTH THREE TIMES A DAY. 90 tablet 6 07/16/2021  ? sertraline (ZOLOFT) 100 MG tablet Take 100 mg by mouth daily.   07/15/2021  ? Turmeric 500  MG CAPS Take 500 mg by mouth 3 (three) times a week.   Past Week  ? warfarin (COUMADIN) 1 MG tablet TAKE 3 TABLETS DAILY, OR AS DIRECTED. (Patient taking differently: TAKE 2 TABLETS DAILY, OR AS DIRECTED.) 90 tablet 0 07/15/2021 at 2200  ? diazepam (VALIUM) 5 MG tablet Take 1 tablet (5 mg total) by mouth daily as needed for anxiety. (Patient not taking: Reported on 07/16/2021) 30 tablet 0 Not Taking  ? traMADol (ULTRAM) 50 MG tablet Take 1 tablet (50 mg total) by mouth 3 (three) times daily. (Patient not taking: Reported on 07/16/2021) 90 tablet 3 Not Taking  ? ? ?Assessment: ?Patient on chronic anticoagulation with Coumadin for afib. Followed in anticoag clinic and last visit on 07/01/21 her dose changed to Coumadin '2mg'$  daily. ? ?INR 5.0 > 4.0> 2.5> 2.0 ?Hgb 9.9 ? ?Goal  of Therapy:  ?INR 2-3 ?Monitor platelets by anticoagulation protocol: Yes ?  ?Plan:  ?Warfarin 2 mg x 1 dose. ?PT-INR daily ?Monitor for S/S of bleeding ? ?Margot Ables, PharmD ?Clinical Pharmacist ?07/20/2021 10:10 AM ? ? ? ?

## 2021-07-20 NOTE — Progress Notes (Signed)
*  PRELIMINARY RESULTS* ?Echocardiogram ?2D Echocardiogram has been performed. ? ?Samuel Germany ?07/20/2021, 10:03 AM ?

## 2021-07-20 NOTE — Progress Notes (Signed)
?PROGRESS NOTE ? ? ? ?Evelyne Makepeace Metsker  VHQ:[id number] DOB: [birth year] DOA: 07/16/2021 ?PCP: Celene Squibb, MD ? ? ?Brief Narrative:  ?Per HPI: ?NICOLLETTE WILHELMI is a 61 y.o. Caucasian female with medical history significant for paroxysmal atrial fibrillation on Coumadin, anxiety and depression, thoracic aortic aneurysm, CHF and coarctation of the aorta, who presented to the emergency room with acute onset of worsening dyspnea with associated congested cough with inability to expectorate as well as wheezing since yesterday.  Has history of COPD without any home oxygen use.  She admits to nausea without vomiting.  She denies any fever or chills.  She has been having chest pain only with cough.  No dysuria, oliguria or hematuria, urgency or frequency or flank pain.  No leg pain or edema or recent travels or surgeries. She denies any orthopnea or proximal nocturnal dyspnea or worsening lower extremity edema. ? ?07/17/21: Patient has been admitted with COPD exacerbation along with acute hypoxemic respiratory failure and is currently on 4 L nasal cannula oxygen.  She continues to have significant amounts of cough. ? ?07/18/21: Patient continues to have significant amounts of cough and ongoing wheezing with COPD exacerbation.  Continue IV steroids and breathing treatments as prescribed.  Pulmonology consulted for assistance. ? ?07/20/21: Patient overall appears to be improving and is currently on 2 L oxygen.  2D echocardiogram with LVEF 60-65% and moderate LVH.  BNP decreasing.  Plan to continue treatments through today and likely will be stable for discharge in the next 1-2 days.  ? ? ?Assessment & Plan: ?  ?Principal Problem: ?  COPD exacerbation ([place]) ?Active Problems: ?  Acute respiratory failure with hypoxia ([place]) ?  Hypokalemia ?  Paroxysmal atrial fibrillation (HCC) ?  Acute on chronic diastolic CHF (congestive heart failure) ([place]) ?  Anxiety and depression ?  Malnutrition of moderate degree ? ?Assessment and Plan: ? ? ?COPD  exacerbation ([place]) ?Still has some bronchospasm, nonproductive cough.  Continue Tussionex, continue IV steroids. ?Continue medications as ordered as patient is still not at baseline, but may likely discharge in the next 1-2 days ?Continue Rocephin and azithromycin ?  ?Acute respiratory failure with hypoxia ([place]) ?Condition gradually improving, continue wean off oxygen. ?Currently on 2 L nasal cannula ?  ?Acute on chronic diastolic CHF (congestive heart failure) ([place]) ?Patient initially received IV Lasix, condition has improved.  Currently on 80 mg oral Lasix.  Recheck a BNP tomorrow. ?Last echocardiogram was performed in 2021, repeat echocardiogram on 3/19 with LVEF 60-65% and moderate LVH ?BNP decreasing and at 437 today ?  ?Paroxysmal atrial fibrillation (HCC) ?Continue warfarin and Toprol XL, currently in sinus rhythm. ?  ?Anxiety and depression ?- We will continue Zoloft and Valium. ?  ?Malnutrition of moderate degree ?Start protein supplements ?  ?  ?DVT prophylaxis: Warfarin ?Code Status: Full ?Family Communication: None at bedside ?Disposition Plan:  ?Status is: Inpatient ?Remains inpatient appropriate because: Need for IV medications. ? ? ?Nutritional Assessment: ? ?The patient?s BMI is: Body mass index is 16.46 kg/m?Marland Kitchen. ? ?Seen by dietician.  I agree with the assessment and plan as outlined below: ? ?Nutrition Status: ?Nutrition Problem: Moderate Malnutrition ?Etiology: chronic illness (COPD, afib, CHF) ?Signs/Symptoms: mild fat depletion, severe muscle depletion ?Interventions: Ensure Enlive (each supplement provides 350kcal and 20 grams of protein), MVI, Liberalize Diet, Education ? ?. ? ?Consultants:  ?PCCM ? ?Procedures:  ?See below ? ?Antimicrobials:  ?Anti-infectives (From admission, onward)  ? ? Start     Dose/Rate Route Frequency Ordered Stop  ?  07/18/21 1030  azithromycin (ZITHROMAX) tablet 500 mg       ? 500 mg Oral Daily 07/18/21 0937 07/20/21 1004  ? 07/16/21 2300  cefTRIAXone (ROCEPHIN) 1 g in  sodium chloride 0.9 % 100 mL IVPB       ? 1 g ?200 mL/hr over 30 Minutes Intravenous Every 24 hours 07/16/21 2242 07/21/21 2259  ? 07/16/21 2145  azithromycin (ZITHROMAX) 500 mg in sodium chloride 0.9 % 250 mL IVPB       ? 500 mg ?250 mL/hr over 60 Minutes Intravenous  Once 07/16/21 2135 07/16/21 2259  ? ?  ? ? ?Subjective: ?Patient seen and evaluated today with ongoing cough that is slowly improving.  She appears to be making slow progress, but is not yet at baseline.  Continues on 2 L nasal cannula. ? ?Objective: ?Vitals:  ? 07/20/21 0136 07/20/21 0534 07/20/21 0759 07/20/21 0904  ?BP:  (!) 142/66    ?Pulse:  71    ?Resp:  18    ?Temp:  98.2 ?F (36.8 ?C)    ?TempSrc:  Oral    ?SpO2: 98% 100% 98% 98%  ?Weight:      ?Height:      ? ?No intake or output data in the 24 hours ending 07/20/21 1134 ?Filed Weights  ? 07/16/21 1622 07/17/21 0236  ?Weight: 43.1 kg 43.5 kg  ? ? ?Examination: ? ?General exam: Appears calm and comfortable  ?Respiratory system: Minimal bilateral wheezing. Respiratory effort normal.  2 L nasal cannula ?Cardiovascular system: S1 & S2 heard, RRR.  ?Gastrointestinal system: Abdomen is soft ?Central nervous system: Alert and awake ?Extremities: No edema ?Skin: No significant lesions noted ?Psychiatry: Flat affect. ? ? ? ?Data Reviewed: I have personally reviewed following labs and imaging studies ? ?CBC: ?Recent Labs  ?Lab 07/16/21 ?1720 07/17/21 ?9563 07/18/21 ?8756 07/19/21 ?4332 07/20/21 ?0805  ?WBC 24.0* 11.6* 14.9* 11.7* 9.7  ?NEUTROABS 19.1*  --   --   --  7.3  ?HGB 10.2* 9.1* 8.9* 9.4* 9.9*  ?HCT 32.3* 29.9* 28.5* 30.3* 32.5*  ?MCV 90.2 92.9 95.3 95.9 95.3  ?PLT 364 238 229 225 239  ? ?Basic Metabolic Panel: ?Recent Labs  ?Lab 07/16/21 ?1644 07/16/21 ?1720 07/17/21 ?9518 07/18/21 ?8416 07/19/21 ?6063 07/19/21 ?1452 07/20/21 ?0805  ?NA  --  135 137 138 142 140  --   ?K  --  3.1* 3.3* 3.8 4.0 3.6  --   ?CL  --  93* 96* 99 97* 95*  --   ?CO2  --  '29 31 31 '$ 32 34*  --   ?GLUCOSE  --  127* 157*  127* 118* 102*  --   ?BUN  --  29* 23* 26* 23* 23*  --   ?CREATININE  --  1.15* 0.97 1.00 0.83 0.99  --   ?CALCIUM  --  9.0 8.3* 8.5* 8.9 9.1  --   ?MG 2.6*  --   --  2.5*  --   --  2.8*  ? ?GFR: ?Estimated Creatinine Clearance: 42 mL/min (by C-G formula based on SCr of 0.99 mg/dL). ?Liver Function Tests: ?Recent Labs  ?Lab 07/16/21 ?1720  ?AST 56*  ?ALT 25  ?ALKPHOS 62  ?BILITOT 0.8  ?PROT 7.4  ?ALBUMIN 3.9  ? ?No results for input(s): LIPASE, AMYLASE in the last 168 hours. ?No results for input(s): AMMONIA in the last 168 hours. ?Coagulation Profile: ?Recent Labs  ?Lab 07/16/21 ?1644 07/17/21 ?1109 07/18/21 ?0160 07/19/21 ?1093 07/20/21 ?0805  ?INR 4.0* 5.0* 4.0*  2.5* 2.0*  ? ?Cardiac Enzymes: ?No results for input(s): CKTOTAL, CKMB, CKMBINDEX, TROPONINI in the last 168 hours. ?BNP (last 3 results) ?No results for input(s): PROBNP in the last 8760 hours. ?HbA1C: ?No results for input(s): HGBA1C in the last 72 hours. ?CBG: ?No results for input(s): GLUCAP in the last 168 hours. ?Lipid Profile: ?No results for input(s): CHOL, HDL, LDLCALC, TRIG, CHOLHDL, LDLDIRECT in the last 72 hours. ?Thyroid Function Tests: ?No results for input(s): TSH, T4TOTAL, FREET4, T3FREE, THYROIDAB in the last 72 hours. ?Anemia Panel: ?No results for input(s): VITAMINB12, FOLATE, FERRITIN, TIBC, IRON, RETICCTPCT in the last 72 hours. ?Sepsis Labs: ?No results for input(s): PROCALCITON, LATICACIDVEN in the last 168 hours. ? ?Recent Results (from the past 240 hour(s))  ?Resp Panel by RT-PCR (Flu A&B, Covid) Nasopharyngeal Swab     Status: None  ? Collection Time: 07/16/21  5:17 PM  ? Specimen: Nasopharyngeal Swab; Nasopharyngeal(NP) swabs in vial transport medium  ?Result Value Ref Range Status  ? SARS Coronavirus 2 by RT PCR NEGATIVE NEGATIVE Final  ?  Comment: (NOTE) ?SARS-CoV-2 target nucleic acids are NOT DETECTED. ? ?The SARS-CoV-2 RNA is generally detectable in upper respiratory ?specimens during the acute phase of infection. The  lowest ?concentration of SARS-CoV-2 viral copies this assay can detect is ?138 copies/mL. A negative result does not preclude SARS-Cov-2 ?infection and should not be used as the sole basis for treatment or ?othe

## 2021-07-21 ENCOUNTER — Encounter: Payer: Self-pay | Admitting: Cardiovascular Disease

## 2021-07-21 DIAGNOSIS — J441 Chronic obstructive pulmonary disease with (acute) exacerbation: Secondary | ICD-10-CM | POA: Diagnosis not present

## 2021-07-21 DIAGNOSIS — I5033 Acute on chronic diastolic (congestive) heart failure: Secondary | ICD-10-CM | POA: Diagnosis not present

## 2021-07-21 DIAGNOSIS — F419 Anxiety disorder, unspecified: Secondary | ICD-10-CM | POA: Diagnosis not present

## 2021-07-21 DIAGNOSIS — J9601 Acute respiratory failure with hypoxia: Secondary | ICD-10-CM | POA: Diagnosis not present

## 2021-07-21 DIAGNOSIS — E440 Moderate protein-calorie malnutrition: Secondary | ICD-10-CM

## 2021-07-21 LAB — BASIC METABOLIC PANEL
Anion gap: 10 (ref 5–15)
BUN: 25 mg/dL — ABNORMAL HIGH (ref 6–20)
CO2: 34 mmol/L — ABNORMAL HIGH (ref 22–32)
Calcium: 8.2 mg/dL — ABNORMAL LOW (ref 8.9–10.3)
Chloride: 94 mmol/L — ABNORMAL LOW (ref 98–111)
Creatinine, Ser: 1.19 mg/dL — ABNORMAL HIGH (ref 0.44–1.00)
GFR, Estimated: 53 mL/min — ABNORMAL LOW (ref >60)
Glucose, Bld: 116 mg/dL — ABNORMAL HIGH (ref 70–99)
Potassium: 3.8 mmol/L (ref 3.5–5.1)
Sodium: 138 mmol/L (ref 135–145)

## 2021-07-21 LAB — MAGNESIUM: Magnesium: 2.8 mg/dL — ABNORMAL HIGH (ref 1.7–2.4)

## 2021-07-21 LAB — CBC
HCT: 32.5 % — ABNORMAL LOW (ref 36.0–46.0)
Hemoglobin: 10 g/dL — ABNORMAL LOW (ref 12.0–15.0)
MCH: 28.5 pg (ref 26.0–34.0)
MCHC: 30.8 g/dL (ref 30.0–36.0)
MCV: 92.6 fL (ref 80.0–100.0)
Platelets: 235 10*3/uL (ref 150–400)
RBC: 3.51 MIL/uL — ABNORMAL LOW (ref 3.87–5.11)
RDW: 14.6 % (ref 11.5–15.5)
WBC: 9.6 10*3/uL (ref 4.0–10.5)
nRBC: 0 % (ref 0.0–0.2)

## 2021-07-21 LAB — PROTIME-INR
INR: 2.4 — ABNORMAL HIGH (ref 0.8–1.2)
Prothrombin Time: 26.4 seconds — ABNORMAL HIGH (ref 11.4–15.2)

## 2021-07-21 MED ORDER — DEXTROMETHORPHAN POLISTIREX ER 30 MG/5ML PO SUER
30.0000 mg | Freq: Two times a day (BID) | ORAL | 0 refills | Status: DC | PRN
Start: 2021-07-21 — End: 2022-01-14

## 2021-07-21 MED ORDER — IPRATROPIUM-ALBUTEROL 20-100 MCG/ACT IN AERS
1.0000 | INHALATION_SPRAY | Freq: Four times a day (QID) | RESPIRATORY_TRACT | 3 refills | Status: DC | PRN
Start: 2021-07-21 — End: 2021-10-06

## 2021-07-21 MED ORDER — HYDROCOD POLI-CHLORPHE POLI ER 10-8 MG/5ML PO SUER: 5.0000 mL | Freq: Two times a day (BID) | ORAL | 0 refills | Status: DC | PRN

## 2021-07-21 MED ORDER — PANTOPRAZOLE SODIUM 40 MG PO TBEC
40.0000 mg | DELAYED_RELEASE_TABLET | Freq: Two times a day (BID) | ORAL | 1 refills | Status: DC
Start: 2021-07-21 — End: 2022-06-04

## 2021-07-21 MED ORDER — BUDESONIDE-FORMOTEROL FUMARATE 160-4.5 MCG/ACT IN AERO: 2.0000 | INHALATION_SPRAY | Freq: Every day | RESPIRATORY_TRACT | 3 refills | Status: DC

## 2021-07-21 MED ORDER — WARFARIN SODIUM 2 MG PO TABS
2.0000 mg | ORAL_TABLET | Freq: Once | ORAL | Status: AC
  Administered 2021-07-21: 2 mg via ORAL
  Filled 2021-07-21: qty 1

## 2021-07-21 MED ORDER — PREDNISONE 20 MG PO TABS: ORAL_TABLET | ORAL | 0 refills | Status: DC

## 2021-07-21 NOTE — Progress Notes (Signed)
SATURATION QUALIFICATIONS: (This note is used to comply with regulatory documentation for home oxygen)  Patient Saturations on Room Air at Rest = 94%  Patient Saturations on Room Air while Ambulating = 87%  Patient Saturations on 2 Liters of oxygen while Ambulating = 94%  Please briefly explain why patient needs home oxygen: COPD 

## 2021-07-21 NOTE — Progress Notes (Signed)
ANTICOAGULATION CONSULT NOTE -  ? ?Pharmacy Consult for Coumadin ?Indication: atrial fibrillation ? ?Allergies  ?Allergen Reactions  ? Amiodarone Nausea Only  ? Penicillins Hives  ?  Has patient had a PCN reaction causing immediate rash, facial/tongue/throat swelling, SOB or lightheadedness with hypotension: YES ?Has patient had a PCN reaction causing severe rash involving mucus membranes or skin necrosis: NO ?Has patient had a PCN reaction that required hospitalization NO ?Has patient had a PCN reaction occurring within the last 10 years: NO ?If all of the above answers are "NO", then may proceed with Cephalosporin use. ?  ? ? ?Patient Measurements: ?Height: '5\' 4"'$  (162.6 cm) ?Weight: 43.5 kg (95 lb 14.4 oz) ?IBW/kg (Calculated) : 54.7 ? ?Vital Signs: ?Temp: 98 ?F (36.7 ?C) (03/20 0431) ?Temp Source: Oral (03/20 0431) ?BP: 124/48 (03/20 0431) ?Pulse Rate: 66 (03/20 0431) ? ?Labs: ?Recent Labs  ?  07/19/21 ?3734 07/19/21 ?1452 07/20/21 ?0805 07/21/21 ?0527  ?HGB 9.4*  --  9.9* 10.0*  ?HCT 30.3*  --  32.5* 32.5*  ?PLT 225  --  239 235  ?LABPROT 27.1*  --  22.7* 26.4*  ?INR 2.5*  --  2.0* 2.4*  ?CREATININE 0.83 0.99  --  1.19*  ? ? ? ?Estimated Creatinine Clearance: 35 mL/min (A) (by C-G formula based on SCr of 1.19 mg/dL (H)). ? ? ?Medical History: ?Past Medical History:  ?Diagnosis Date  ? Aortic stenosis   ? ATRIAL ARRHYTHMIAS   ? Bicuspid aortic valve   ? CHF (congestive heart failure) ([place])   ? COARCTATION OF AORTA   ? ENDOMETRIOSIS   ? Heart murmur   ? Paroxysmal atrial fibrillation (HCC)   ? Shortness of breath   ? THORACIC AORTIC ANEURYSM   ? ? ?Medications:  ?Medications Prior to Admission  ?Medication Sig Dispense Refill Last Dose  ? aspirin EC 81 MG tablet Take 81 mg by mouth daily.   07/15/2021  ? benzonatate (TESSALON PERLES) 100 MG capsule Take 1 capsule (100 mg total) by mouth 3 (three) times daily as needed for cough. 30 capsule 1 07/15/2021  ? diazepam (VALIUM) 10 MG tablet Take 10 mg by mouth daily as  needed.   Past Week  ? furosemide (LASIX) 40 MG tablet Take 80 mg by mouth daily.   07/15/2021  ? gabapentin (NEURONTIN) 300 MG capsule Take 300 mg by mouth See admin instructions. Take 300 mg in the morning, 300 mg in the afternoon   600 mg at bedtime   07/15/2021  ? HYDROcodone-acetaminophen (NORCO/VICODIN) 5-325 MG tablet Take 2 tablets by mouth 2 (two) times daily.   07/15/2021  ? metoprolol succinate (TOPROL-XL) 25 MG 24 hr tablet TAKE 1 TABLET BY MOUTH ONCE DAILY. 90 tablet 3 07/15/2021 at AM  ? Multiple Vitamin (MULTI-VITAMINS) TABS Take 1 tablet by mouth daily.    Past Week  ? NEXLETOL 180 MG TABS Take 1 tablet by mouth daily.   07/15/2021  ? ondansetron (ZOFRAN ODT) 4 MG disintegrating tablet Take 1 tablet (4 mg total) by mouth every 8 (eight) hours as needed for nausea or vomiting. 20 tablet 0 unknown  ? potassium chloride SA (KLOR-CON) 20 MEQ tablet TAKE (1) TABLET BY MOUTH ONCE DAILY. (Patient taking differently: Take 20 mEq by mouth daily.) 90 tablet 1 07/15/2021  ? propafenone (RYTHMOL) 300 MG tablet TAKE 1 TABLET BY MOUTH THREE TIMES A DAY. 90 tablet 6 07/16/2021  ? sertraline (ZOLOFT) 100 MG tablet Take 100 mg by mouth daily.   07/15/2021  ?  Turmeric 500 MG CAPS Take 500 mg by mouth 3 (three) times a week.   Past Week  ? warfarin (COUMADIN) 1 MG tablet TAKE 3 TABLETS DAILY, OR AS DIRECTED. (Patient taking differently: TAKE 2 TABLETS DAILY, OR AS DIRECTED.) 90 tablet 0 07/15/2021 at 2200  ? diazepam (VALIUM) 5 MG tablet Take 1 tablet (5 mg total) by mouth daily as needed for anxiety. (Patient not taking: Reported on 07/16/2021) 30 tablet 0 Not Taking  ? traMADol (ULTRAM) 50 MG tablet Take 1 tablet (50 mg total) by mouth 3 (three) times daily. (Patient not taking: Reported on 07/16/2021) 90 tablet 3 Not Taking  ? ? ?Assessment: ?Patient on chronic anticoagulation with Coumadin for afib. Followed in anticoag clinic and last visit on 07/01/21 her dose changed to Coumadin '2mg'$  daily. ? ?INR 5.0 > 4.0> 2.5> 2.0>  2.4 ?Hgb 10.0 ? ?Goal of Therapy:  ?INR 2-3 ?Monitor platelets by anticoagulation protocol: Yes ?  ?Plan:  ?Warfarin 2 mg x 1 dose. ?PT-INR daily ?Monitor for S/S of bleeding ? ?Margot Ables, PharmD ?Clinical Pharmacist ?07/21/2021 7:44 AM ? ? ? ?

## 2021-07-21 NOTE — Discharge Summary (Addendum)
?Physician Discharge Summary ?  ?Patient: Mallory Sutton MRN: [id number] DOB: Oct 03, 1960  ?Admit date:     07/16/2021  ?Discharge date: 07/22/21  ?Discharge Physician: Mallory Sutton  ? ?PCP: Mallory Squibb, MD  ? ?Recommendations at discharge:  ?Repeat basic metabolic panel to follow renal function and electrolytes  stability. ?Reassessed patient clinical progression and instability after initiation of home inhalers along with steroids tapering. ?Make sure she has follow-up with cardiology service as instructed and is planning/had appointment to see pulmonologist as recommended. ? ?Discharge Diagnoses: ?Principal Problem: ?  COPD exacerbation ([place]) ?Active Problems: ?  Acute respiratory failure with hypoxia ([place]) ?  Hypokalemia ?  Paroxysmal atrial fibrillation (HCC) ?  Acute on chronic diastolic CHF (congestive heart failure) ([place]) ?  Anxiety and depression ?  Malnutrition of moderate degree ? ? ?Hospital Course: ?As per H&P written by Mallory Sutton on 07/16/2021 ?Mallory Sutton is a 61 y.o. Caucasian female with medical history significant for paroxysmal atrial fibrillation on Coumadin, anxiety and depression, thoracic aortic aneurysm, CHF and coarctation of the aorta, who presented to the emergency room with acute onset of worsening dyspnea with associated congested cough with inability to expectorate as well as wheezing since yesterday.  She admits to nausea without vomiting.  She denies any fever or chills.  She has been having chest pain only with cough.  No dysuria, oliguria or hematuria, urgency or frequency or flank pain.  No leg pain or edema or recent travels or surgeries. She denies any orthopnea or proximal nocturnal dyspnea or worsening lower extremity edema. ?  ?ED Course: Upon presentation to the emergency room, vital signs were within normal except for oximetry of 85% on room air. Labs revealed hypokalemia of 3.1 and hypochloremia of 93 with a BUN of 29 and creatinine 1.15, ALT 56 and BNP was 1150.   ?EKG as  reviewed by me : EKG showed normal sinus rhythm with a rate of 69 and PACs, left intrafascicular block and LVH with repolarization abnormality and anterior Q waves. ?Imaging: Portable chest ray showed emphysema with no cardiopulmonary disease. ? ?Assessment and Plan: ?* COPD exacerbation ([place]) ?-Still has some bronchospasm and nonproductive cough.  ?-Continue Tussionex, steroids tapering and initiation of rescue inhaler and Symbicort maintenance therapy. ?-Outpatient follow-up with pulmonologist for PFTs ?-Continue twice daily PPI. ?-Desaturation screening demonstrated the need of 2 L oxygen supplementation especially with exertion (oxygen saturation dropping to 87% with activity). ? ? ?Acute respiratory failure with hypoxia ([place]) ?- Due to COPD exacerbation ?-Continue gradually improving; good saturation at rest on room air; with activity oxygen saturation drops to 87%.  Patient has been discharged on 2 L of oxygen supplementation mainly to be used with activity. ?-Outpatient follow-up with pulmonology as has been recommended. ?-Continue treatment with steroids tapering, bronchodilator management and antitussive medications. ? ?Hypokalemia ?-Repleted and within normal limits at discharge ?-Recommending repeat basic metabolic panel to assess electrolytes level and stability. ? ?Acute on chronic diastolic CHF (congestive heart failure) ([place]) ?-Patient initially received IV Lasix, condition has improved.   ?-Discharge back on her home Lasix therapy; advised to follow low-sodium diet and to check her weight on daily basis. ?-Continue outpatient follow-up with cardiology service. ?-2D echo demonstrating preserved ejection fraction. ? ? ?Paroxysmal atrial fibrillation (HCC) ?-Continue warfarin and Toprol XL, currently in sinus rhythm. ?-Continue outpatient follow-up with cardiology service. ? ?Anxiety and depression ?-Mood overall stable ?-No hallucinations or suicidal ideation ?-Continue Zoloft and  Valium. ? ?Malnutrition of moderate degree ?-Patient advised  to use feeding supplements in between meals. ? ? ?Consultants: Pulmonology service ?Procedures performed: See below for x-ray reports; 2D echo. ?Disposition: Discharged home with instruction to follow-up with PCP, cardiology and pulmonology as an outpatient.  Oxygen 2 L DME arranged at discharge. ? ?Diet recommendation: Heart healthy/low-sodium diet. ? ?DISCHARGE MEDICATION: ?Allergies as of 07/21/2021   ? ?   Reactions  ? Amiodarone Nausea Only  ? Penicillins Hives  ? Has patient had a PCN reaction causing immediate rash, facial/tongue/throat swelling, SOB or lightheadedness with hypotension: YES ?Has patient had a PCN reaction causing severe rash involving mucus membranes or skin necrosis: NO ?Has patient had a PCN reaction that required hospitalization NO ?Has patient had a PCN reaction occurring within the last 10 years: NO ?If all of the above answers are "NO", then may proceed with Cephalosporin use.  ? ?  ? ?  ?Medication List  ?  ? ?TAKE these medications   ? ?aspirin EC 81 MG tablet ?Take 81 mg by mouth daily. ?  ?benzonatate 100 MG capsule ?Commonly known as: Best boy ?Take 1 capsule (100 mg total) by mouth 3 (three) times daily as needed for cough. ?  ?budesonide-formoterol 160-4.5 MCG/ACT inhaler ?Commonly known as: Symbicort ?Inhale 2 puffs into the lungs daily. ?  ?chlorpheniramine-HYDROcodone 10-8 MG/5ML ?Take 5 mLs by mouth every 12 (twelve) hours as needed for cough. ?  ?dextromethorphan 30 MG/5ML liquid ?Commonly known as: Delsym ?Take 5 mLs (30 mg total) by mouth every 12 (twelve) hours as needed for cough. ?  ?diazepam 10 MG tablet ?Commonly known as: VALIUM ?Take 10 mg by mouth daily as needed. ?What changed: Another medication with the same name was removed. Continue taking this medication, and follow the directions you see here. ?  ?furosemide 40 MG tablet ?Commonly known as: LASIX ?Take 80 mg by mouth daily. ?  ?gabapentin 300  MG capsule ?Commonly known as: NEURONTIN ?Take 300 mg by mouth See admin instructions. Take 300 mg in the morning, 300 mg in the afternoon   600 mg at bedtime ?  ?HYDROcodone-acetaminophen 5-325 MG tablet ?Commonly known as: NORCO/VICODIN ?Take 2 tablets by mouth 2 (two) times daily. ?  ?Ipratropium-Albuterol 20-100 MCG/ACT Aers respimat ?Commonly known as: COMBIVENT ?Inhale 1 puff into the lungs every 6 (six) hours as needed for wheezing or shortness of breath. ?  ?metoprolol succinate 25 MG 24 hr tablet ?Commonly known as: TOPROL-XL ?TAKE 1 TABLET BY MOUTH ONCE DAILY. ?  ?Multi-Vitamins Tabs ?Take 1 tablet by mouth daily. ?  ?Nexletol 180 MG Tabs ?Generic drug: Bempedoic Acid ?Take 1 tablet by mouth daily. ?  ?ondansetron 4 MG disintegrating tablet ?Commonly known as: Zofran ODT ?Take 1 tablet (4 mg total) by mouth every 8 (eight) hours as needed for nausea or vomiting. ?  ?pantoprazole 40 MG tablet ?Commonly known as: PROTONIX ?Take 1 tablet (40 mg total) by mouth 2 (two) times daily before a meal. ?  ?potassium chloride SA 20 MEQ tablet ?Commonly known as: KLOR-CON M ?TAKE (1) TABLET BY MOUTH ONCE DAILY. ?What changed: See the new instructions. ?  ?predniSONE 20 MG tablet ?Commonly known as: DELTASONE ?Take 3 tablets by mouth daily x2 days; then 2 tablets by mouth daily x2 days; then 1 tablet by mouth daily x3 days; then half tablet by mouth daily x3 days and stop prednisone. ?  ?propafenone 300 MG tablet ?Commonly known as: RYTHMOL ?TAKE 1 TABLET BY MOUTH THREE TIMES A DAY. ?  ?sertraline 100 MG tablet ?Commonly  known as: ZOLOFT ?Take 100 mg by mouth daily. ?  ?traMADol 50 MG tablet ?Commonly known as: ULTRAM ?Take 1 tablet (50 mg total) by mouth 3 (three) times daily. ?  ?Turmeric 500 MG Caps ?Take 500 mg by mouth 3 (three) times a week. ?  ?warfarin 1 MG tablet ?Commonly known as: COUMADIN ?Take as directed. If you are unsure how to take this medication, talk to your nurse or doctor. ?Original instructions:  TAKE 3 TABLETS DAILY, OR AS DIRECTED. ?What changed: additional instructions ?  ? ?  ? ?  ?  ? ? ?  ?Durable Medical Equipment  ?(From admission, onward)  ?  ? ? ?  ? ?  Start     Ordered  ? 07/21/21 1703  For home use only

## 2021-07-22 LAB — PROTIME-INR
INR: 2.5 — ABNORMAL HIGH (ref 0.8–1.2)
Prothrombin Time: 27.2 seconds — ABNORMAL HIGH (ref 11.4–15.2)

## 2021-07-22 MED ORDER — IPRATROPIUM-ALBUTEROL 0.5-2.5 (3) MG/3ML IN SOLN: 3.0000 mL | Freq: Two times a day (BID) | RESPIRATORY_TRACT | Status: DC

## 2021-07-22 NOTE — Plan of Care (Signed)
  Problem: Education: Goal: Knowledge of disease or condition will improve Outcome: Progressing   

## 2021-07-22 NOTE — Progress Notes (Signed)
Chart reviewed.  No overnight events.  Hemodynamically stable and demonstrating good oxygen saturation with current oxygen supplementation.  Patient felt to be discharge on 07/21/2021 as planned due to lack of oxygen equipment being delivered. She is ready for discharge. Discharge summary and instructions reviewed, no changes needed. ? ?Barton Dubois MD ?[phone] ? ?

## 2021-07-22 NOTE — Plan of Care (Signed)
?  Problem: Elimination: ?Goal: Will not experience complications related to bowel motility ?Outcome: Not Progressing ?  ?Problem: Nutrition: ?Goal: Adequate nutrition will be maintained ?Outcome: Not Progressing ?  ?Problem: Safety: ?Goal: Ability to remain free from injury will improve ?Outcome: Not Progressing ?  ?Problem: Skin Integrity: ?Goal: Risk for impaired skin integrity will decrease ?Outcome: Not Progressing ?  ?

## 2021-07-22 NOTE — Progress Notes (Signed)
Late entry for 07/21/21: ?TOC notified by RN that patient qualified for oxygen.  ? ?Oxygen ordered via [place]aryl P[place] with [employer] and to be delivered to patient's room.  ? ? ?Veleka Djordjevic D, LCSW  ?

## 2021-07-22 NOTE — Progress Notes (Signed)
Nsg Discharge Note ? ?Admit Date:  07/16/2021 ?Discharge date: 07/22/2021 ?  ?Carita Pian Sypher to be D/C'd Home per MD order.  AVS completed.  Copy for chart, and copy for patient signed, and dated. ?Reviewed d/c paperwork with patient. She had some additional questions so I asked Dr. Dyann Kief to come and talk with her. SWOT nurse wheeled stable patient and belongings including oxygen tank to main entrance where she was picked up by her daughter. ?Patient/caregiver able to verbalize understanding. ? ?Discharge Medication: ? ? ? ?Discharge Assessment: ?Vitals:  ? 07/22/21 0940 07/22/21 1310  ?BP: (!) 156/102 (!) 152/87  ?Pulse: 77 (!) 58  ?Resp:  20  ?Temp:  98 ?F (36.7 ?C)  ?SpO2:  100%  ? Skin clean, dry and intact without evidence of skin break down, no evidence of skin tears noted. ?IV catheter discontinued intact. Site without signs and symptoms of complications - no redness or edema noted at insertion site, patient denies c/o pain - only slight tenderness at site.  Dressing with slight pressure applied. ? ?D/c Instructions-Education: ?Discharge instructions given to patient/family with verbalized understanding. ?D/c education completed with patient/family including follow up instructions, medication list, d/c activities limitations if indicated, with other d/c instructions as indicated by MD - patient able to verbalize understanding, all questions fully answered. ?Patient instructed to return to ED, call 911, or call MD for any changes in condition.  ?Patient escorted via [place], and D/C home via private auto. ? ?Santa Lighter, RN ?07/22/2021 2:35 PM  ?

## 2021-07-22 NOTE — Progress Notes (Signed)
TOC received a call from [place] with [employer] advising that due to a new evening process that she was not aware of patient's oxygen was not delivered on 3/20.  ? ?TOC received a second call from [place] with [employer] advising that patient's insurance was not in network.  ? ?TOC contacted [place] with [employer] who advised that patient was in [facility] and made referral for Oxygen.  ? ? ?TOC Signing off. ? ? ?Onnika Siebel D, LCSW   ?

## 2021-07-22 NOTE — Plan of Care (Signed)
?  Problem: Education: ?Goal: Knowledge of disease or condition will improve ?07/22/2021 0945 by Santa Lighter, RN ?Outcome: Progressing ?07/22/2021 0909 by Santa Lighter, RN ?Outcome: Progressing ?  ?Problem: Education: ?Goal: Knowledge of General Education information will improve ?Description: Including pain rating scale, medication(s)/side effects and non-pharmacologic comfort measures ?Outcome: Progressing ?  ?Problem: Health Behavior/Discharge Planning: ?Goal: Ability to manage health-related needs will improve ?Outcome: Progressing ?  ?

## 2021-07-22 NOTE — Plan of Care (Signed)
?  Problem: Education: ?Goal: Knowledge of disease or condition will improve ?07/22/2021 1135 by Santa Lighter, RN ?Outcome: Adequate for Discharge ?07/22/2021 0945 by Santa Lighter, RN ?Outcome: Progressing ?07/22/2021 0909 by Santa Lighter, RN ?Outcome: Progressing ?Goal: Knowledge of the prescribed therapeutic regimen will improve ?Outcome: Adequate for Discharge ?Goal: Individualized Educational Video(s) ?Outcome: Adequate for Discharge ?  ?Problem: Activity: ?Goal: Ability to tolerate increased activity will improve ?Outcome: Adequate for Discharge ?Goal: Will verbalize the importance of balancing activity with adequate rest periods ?Outcome: Adequate for Discharge ?  ?Problem: Respiratory: ?Goal: Ability to maintain a clear airway will improve ?Outcome: Adequate for Discharge ?Goal: Levels of oxygenation will improve ?Outcome: Adequate for Discharge ?Goal: Ability to maintain adequate ventilation will improve ?Outcome: Adequate for Discharge ?  ?Problem: Education: ?Goal: Knowledge of General Education information will improve ?Description: Including pain rating scale, medication(s)/side effects and non-pharmacologic comfort measures ?07/22/2021 1135 by Santa Lighter, RN ?Outcome: Adequate for Discharge ?07/22/2021 0945 by Santa Lighter, RN ?Outcome: Progressing ?  ?Problem: Health Behavior/Discharge Planning: ?Goal: Ability to manage health-related needs will improve ?07/22/2021 1135 by Santa Lighter, RN ?Outcome: Adequate for Discharge ?07/22/2021 0945 by Santa Lighter, RN ?Outcome: Progressing ?  ?Problem: Clinical Measurements: ?Goal: Ability to maintain clinical measurements within normal limits will improve ?Outcome: Adequate for Discharge ?Goal: Will remain free from infection ?Outcome: Adequate for Discharge ?Goal: Diagnostic test results will improve ?Outcome: Adequate for Discharge ?Goal: Respiratory complications will improve ?Outcome: Adequate for Discharge ?Goal: Cardiovascular complication will be  avoided ?Outcome: Adequate for Discharge ?  ?Problem: Activity: ?Goal: Risk for activity intolerance will decrease ?Outcome: Adequate for Discharge ?  ?Problem: Nutrition: ?Goal: Adequate nutrition will be maintained ?Outcome: Adequate for Discharge ?  ?Problem: Coping: ?Goal: Level of anxiety will decrease ?Outcome: Adequate for Discharge ?  ?Problem: Elimination: ?Goal: Will not experience complications related to bowel motility ?Outcome: Adequate for Discharge ?Goal: Will not experience complications related to urinary retention ?Outcome: Adequate for Discharge ?  ?Problem: Pain Managment: ?Goal: General experience of comfort will improve ?Outcome: Adequate for Discharge ?  ?Problem: Safety: ?Goal: Ability to remain free from injury will improve ?Outcome: Adequate for Discharge ?  ?Problem: Skin Integrity: ?Goal: Risk for impaired skin integrity will decrease ?Outcome: Adequate for Discharge ?  ?Problem: Malnutrition  (NI-5.2) ?Goal: Food and/or nutrient delivery ?Description: Individualized approach for food/nutrient provision. ?Outcome: Adequate for Discharge ?  ?

## 2021-07-23 LAB — ALPHA-1-ANTITRYPSIN PHENOTYP: A-1 Antitrypsin, Ser: 226 mg/dL — ABNORMAL HIGH (ref 101–187)

## 2021-07-29 ENCOUNTER — Ambulatory Visit (INDEPENDENT_AMBULATORY_CARE_PROVIDER_SITE_OTHER): Payer: 59 | Admitting: *Deleted

## 2021-07-29 DIAGNOSIS — Z5181 Encounter for therapeutic drug level monitoring: Secondary | ICD-10-CM

## 2021-07-29 DIAGNOSIS — I4891 Unspecified atrial fibrillation: Secondary | ICD-10-CM

## 2021-07-29 DIAGNOSIS — Q231 Congenital insufficiency of aortic valve: Secondary | ICD-10-CM | POA: Diagnosis not present

## 2021-07-29 DIAGNOSIS — I4892 Unspecified atrial flutter: Secondary | ICD-10-CM | POA: Diagnosis not present

## 2021-07-29 LAB — POCT INR: INR: 3.1 — AB (ref 2.0–3.0)

## 2021-07-29 NOTE — Patient Instructions (Signed)
Take warfarin 1 tablet tonight then resume 2 tablets daily.  ?Recheck in 3 weeks ?

## 2021-08-05 ENCOUNTER — Encounter: Payer: Self-pay | Admitting: Pulmonary Disease

## 2021-08-05 ENCOUNTER — Other Ambulatory Visit: Payer: Self-pay | Admitting: Cardiovascular Disease

## 2021-08-05 ENCOUNTER — Ambulatory Visit (INDEPENDENT_AMBULATORY_CARE_PROVIDER_SITE_OTHER): Payer: 59 | Admitting: Pulmonary Disease

## 2021-08-05 VITALS — BP 120/64 | HR 64 | Ht 64.0 in | Wt 94.4 lb

## 2021-08-05 DIAGNOSIS — J449 Chronic obstructive pulmonary disease, unspecified: Secondary | ICD-10-CM

## 2021-08-05 MED ORDER — BUDESONIDE-FORMOTEROL FUMARATE 160-4.5 MCG/ACT IN AERO: 2.0000 | INHALATION_SPRAY | Freq: Two times a day (BID) | RESPIRATORY_TRACT | 3 refills | Status: DC

## 2021-08-05 MED ORDER — SPIRIVA RESPIMAT 2.5 MCG/ACT IN AERS: 2.0000 | INHALATION_SPRAY | Freq: Every day | RESPIRATORY_TRACT | 0 refills | Status: DC

## 2021-08-05 NOTE — Progress Notes (Signed)
? ?Synopsis: Referred in April 2023 for COPD and hospital follow ? ?Subjective:  ? ?PATIENT ID: Mallory Sutton GENDER: female DOB: 10-12-1960, MRN: [id number] ? ? ?HPI ? ?Chief Complaint  ?Patient presents with  ? Consult  ?  Referred by hospital for COPD. Had a COPD exacerbation about 3 weeks ago. On 2L of O2.   ? ?Mallory Sutton is a 61 year old woman, never smoker with history of congenital heart disease with coarctation repair at age 28, bicuspid AV s/p replacement and aortic arch repair in 2016, PAF s/p MAZE and left atrial appendage occlusion who is referred to pulmonary clinic for recent hospitalization for COPD exacerbation.  ? ?Patient was admitted to [facility] 3/15 to 3/21 for acute hypoxemic respiratory failure with concern for COPD exacerbation.  Patient does not carry history of COPD and is a never smoker.  She reports significant secondhand smoke exposure in childhood from her parents and throughout her life from her husband and son.  She also reports they have had a wood stove as their home heating source for many years. ? ?Prior to last fall she only noticed shortness of breath when she was in atrial fibrillation.  She denies any history of cough or wheezing.  She reports her cough and wheezing during the hospital ideation is much improved.  She has increased cough when she lays down.  She denies any ongoing fevers, chills or sweats. ? ?She is currently using Symbicort 1 puff in the morning and 1 puff at night.  She has an as needed Combivent inhaler.  She received nebulizer treatments in the hospital which she prefers over the inhaler therapy. ? ?She reports family history of lung cancer in her grandfather. ? ?Past Medical History:  ?Diagnosis Date  ? Aortic stenosis   ? ATRIAL ARRHYTHMIAS   ? Bicuspid aortic valve   ? CHF (congestive heart failure) ([place])   ? COARCTATION OF AORTA   ? ENDOMETRIOSIS   ? Heart murmur   ? Paroxysmal atrial fibrillation (HCC)   ? Shortness of breath   ? THORACIC AORTIC  ANEURYSM   ?  ? ?Family History  ?Problem Relation Age of Onset  ? Suicidality Mother   ? Mental illness Mother   ? Drug abuse Brother   ? Healthy Daughter   ? Healthy Son   ?  ? ?Social History  ? ?Socioeconomic History  ? Marital status: Married  ?  Spouse name: Not on file  ? Number of children: Not on file  ? Years of education: Not on file  ? Highest education level: Not on file  ?Occupational History  ? Not on file  ?Tobacco Use  ? Smoking status: Never  ?  Passive exposure: Past  ? Smokeless tobacco: Never  ?Vaping Use  ? Vaping Use: Never used  ?Substance and Sexual Activity  ? Alcohol use: Yes  ?  Alcohol/week: 0.0 standard drinks  ?  Comment: 3 beers 3 nights a week  ? Drug use: No  ? Sexual activity: Never  ?Other Topics Concern  ? Not on file  ?Social History Narrative  ? Not on file  ? ?Social Determinants of Health  ? ?Financial Resource Strain: Not on file  ?Food Insecurity: Not on file  ?Transportation Needs: Not on file  ?Physical Activity: Not on file  ?Stress: Not on file  ?Social Connections: Not on file  ?Intimate Partner Violence: Not on file  ?  ? ?Allergies  ?Allergen Reactions  ? Amiodarone Nausea  Only  ? Penicillins Hives  ?  Has patient had a PCN reaction causing immediate rash, facial/tongue/throat swelling, SOB or lightheadedness with hypotension: YES ?Has patient had a PCN reaction causing severe rash involving mucus membranes or skin necrosis: NO ?Has patient had a PCN reaction that required hospitalization NO ?Has patient had a PCN reaction occurring within the last 10 years: NO ?If all of the above answers are "NO", then may proceed with Cephalosporin use. ?  ?  ? ?Outpatient Medications Prior to Visit  ?Medication Sig Dispense Refill  ? aspirin EC 81 MG tablet Take 81 mg by mouth daily.    ? benzonatate (TESSALON PERLES) 100 MG capsule Take 1 capsule (100 mg total) by mouth 3 (three) times daily as needed for cough. 30 capsule 1  ? chlorpheniramine-HYDROcodone 10-8 MG/5ML Take 5 mLs  by mouth every 12 (twelve) hours as needed for cough. 230 mL 0  ? dextromethorphan (DELSYM) 30 MG/5ML liquid Take 5 mLs (30 mg total) by mouth every 12 (twelve) hours as needed for cough. 89 mL 0  ? diazepam (VALIUM) 10 MG tablet Take 10 mg by mouth daily as needed.    ? furosemide (LASIX) 40 MG tablet Take 80 mg by mouth daily.    ? gabapentin (NEURONTIN) 300 MG capsule Take 300 mg by mouth See admin instructions. Take 300 mg in the morning, 300 mg in the afternoon   600 mg at bedtime    ? HYDROcodone-acetaminophen (NORCO/VICODIN) 5-325 MG tablet Take 2 tablets by mouth 2 (two) times daily.    ? Ipratropium-Albuterol (COMBIVENT) 20-100 MCG/ACT AERS respimat Inhale 1 puff into the lungs every 6 (six) hours as needed for wheezing or shortness of breath. 4 g 3  ? metoprolol succinate (TOPROL-XL) 25 MG 24 hr tablet TAKE 1 TABLET BY MOUTH ONCE DAILY. 90 tablet 3  ? Multiple Vitamin (MULTI-VITAMINS) TABS Take 1 tablet by mouth daily.     ? ondansetron (ZOFRAN ODT) 4 MG disintegrating tablet Take 1 tablet (4 mg total) by mouth every 8 (eight) hours as needed for nausea or vomiting. 20 tablet 0  ? pantoprazole (PROTONIX) 40 MG tablet Take 1 tablet (40 mg total) by mouth 2 (two) times daily before a meal. 60 tablet 1  ? potassium chloride SA (KLOR-CON) 20 MEQ tablet TAKE (1) TABLET BY MOUTH ONCE DAILY. (Patient taking differently: Take 20 mEq by mouth daily.) 90 tablet 1  ? propafenone (RYTHMOL) 300 MG tablet TAKE 1 TABLET BY MOUTH THREE TIMES A DAY. 90 tablet 6  ? sertraline (ZOLOFT) 100 MG tablet Take 100 mg by mouth daily.    ? Turmeric 500 MG CAPS Take 500 mg by mouth 3 (three) times a week.    ? budesonide-formoterol (SYMBICORT) 160-4.5 MCG/ACT inhaler Inhale 2 puffs into the lungs daily. 1 each 3  ? warfarin (COUMADIN) 1 MG tablet TAKE 3 TABLETS DAILY, OR AS DIRECTED. (Patient taking differently: TAKE 2 TABLETS DAILY, OR AS DIRECTED.) 90 tablet 0  ? predniSONE (DELTASONE) 20 MG tablet Take 3 tablets by mouth daily x2  days; then 2 tablets by mouth daily x2 days; then 1 tablet by mouth daily x3 days; then half tablet by mouth daily x3 days and stop prednisone. 15 tablet 0  ? traMADol (ULTRAM) 50 MG tablet Take 1 tablet (50 mg total) by mouth 3 (three) times daily. (Patient not taking: Reported on 07/16/2021) 90 tablet 3  ? NEXLETOL 180 MG TABS Take 1 tablet by mouth daily.    ? ?  No facility-administered medications prior to visit.  ? ? ?Review of Systems  ?Constitutional:  Negative for chills, fever, malaise/fatigue and weight loss.  ?HENT:  Negative for congestion, sinus pain and sore throat.   ?Eyes: Negative.   ?Respiratory:  Positive for cough and shortness of breath. Negative for hemoptysis, sputum production and wheezing.   ?Cardiovascular:  Positive for chest pain and palpitations. Negative for orthopnea, claudication and leg swelling.  ?Gastrointestinal:  Negative for abdominal pain, heartburn, nausea and vomiting.  ?Genitourinary: Negative.   ?Musculoskeletal:  Negative for joint pain and myalgias.  ?Skin:  Negative for rash.  ?Neurological:  Negative for weakness.  ?Endo/Heme/Allergies: Negative.   ?Psychiatric/Behavioral:  Positive for depression. The patient is nervous/anxious.   ? ? ? ?Objective:  ? ?Vitals:  ? 08/05/21 1552  ?BP: 120/64  ?Pulse: 64  ?SpO2: 100%  ?Weight: 94 lb 6.4 oz (42.8 kg)  ?Height: '5\' 4"'$  (1.626 m)  ? ? ? ?Physical Exam ?Constitutional:   ?   General: She is not in acute distress. ?   Appearance: She is not ill-appearing.  ?   Comments: thin  ?HENT:  ?   Head: Normocephalic and atraumatic.  ?Eyes:  ?   General: No scleral icterus. ?   Conjunctiva/sclera: Conjunctivae normal.  ?   Pupils: Pupils are equal, round, and reactive to light.  ?Cardiovascular:  ?   Rate and Rhythm: Normal rate and regular rhythm.  ?   Pulses: Normal pulses.  ?   Heart sounds: Normal heart sounds. No murmur heard. ?Pulmonary:  ?   Effort: Pulmonary effort is normal.  ?   Breath sounds: Normal breath sounds. Decreased air  movement present. No wheezing, rhonchi or rales.  ?Abdominal:  ?   General: Bowel sounds are normal.  ?   Palpations: Abdomen is soft.  ?Musculoskeletal:  ?   Right lower leg: No edema.  ?   Left lower leg:

## 2021-08-05 NOTE — Patient Instructions (Addendum)
Continue symbicort inhaler 2 puffs twice daily with spacer ?- rinse mouth out after each use ? ?Start Spiriva 2.69mg 2 puffs daily ? ?Use combivent 1-2 puffs every 4-6 hours as needed ? ?We will check pulmonary function tests at follow up in 2 months ?

## 2021-08-09 ENCOUNTER — Encounter: Payer: Self-pay | Admitting: Pulmonary Disease

## 2021-08-13 ENCOUNTER — Encounter: Payer: Self-pay | Admitting: Cardiovascular Disease

## 2021-08-13 NOTE — Telephone Encounter (Signed)
Pt seen by PCP on yesterday. Pt gained 8 lbs in one week. Pt denies swelling and increased SOB. She does report chest pain but states that it's not new. PCP would like pt seen sooner than June. Appt made for 4/14 at 1:15 with Dr. [name]. Pt states that it is also time for her 6 month check up.  ?

## 2021-08-14 NOTE — Progress Notes (Signed)
Patient ID: Mallory Sutton, female   DOB: 1961/04/16, 61 y.o.   MRN: [id number]  ? ? ? ? ?61 y.o. long term patient followed since 1999. She has had congenital heart disease with coarctation repair at age 53 via left thoracotomy. Known persistent left sided SVC  Followed for long time for bicuspid AV and aortic aneurysm Finally had AVR and arch repair by [name] in 2016. I was upset that he did not bypass her coarctation at the time She had PAF and MAZE with LAA occlusion. She has been stable on propafenone for years and followed by Dr Lovena Le in [place] ? ?She developed a neuropathy and painful ? Vascular rash in her legs 2019 with short course of steroids.   Recent US RLE with complex cystic lesion in posterior upper left thigh  ? ?TEE done 08/29/19 showed normal EF she had no AR and mean gradient across her AVR 14 mmHg. She has not had recent MRI/CT imaging of her coarctation but the MLD had been < 1 cm in past    ? ?Her care has been compromised in past due to lack of insurance and financial issues She has been sporadic at times with her INR checks being subRx the last month  ? ?Vasculitis does not seem well Rx Still with lots of discoloration and some pain in legs Also developing more cysts She needs to have on removed by Dr Constance Haw from back of right thigh as it is pressing on nerve Also has one above right knee with lateral knee effusion Had steroid injection right knee 11/01/20 SEen by rheumatology DR Benjamine Mola 12/31/20  Labs including ESR, ANCA Cryoglobulin and cardiolipin negative  ? ?TTE:  07/20/21  ?EF 60-65% Normal RV mild MR ?AVR On-X 19 mm mean gradient 29 mmHg trivial PVL DVI 0.44  ?CXR 07/16/21 Emphysema NAD  ? ?BNP 07/20/21 437 ?BUN/Cr BUN 25 Cr 1.19 K 3.8  ?Hct 32.5  ?INR 2.5  ? ?Hospitalized 3/15-21/23 for hypoxemic respiratory failure COPD She is a non smoker Wood stove for heat D/c on 2L's oxygen Symbicort and Spiriva Plan to do walk test and consider nocturnal oxymetry She was in NSR throughout and d/c with lasix  80 mg daily  ? ?Physically she looks great today. She has trivial edema which is vastly improved since she had her vasculitis. Emotionally she is horrible. She feels helpless with another medical issue and being tied to oxygen. Her husband Mallory Sutton is being ugly to her in regard to living his life in the shadow of all her medical problems and he is getting too old to do all his landscaping work and finances have always been tight.  ? ?I spoke with her daughter Mallory Sutton at length today as her kids can sometimes be intermediaries and the son lives with them  ? ?ROS: Denies fever, malais, weight loss, blurry vision, decreased visual acuity, cough, sputum, SOB, hemoptysis, pleuritic pain, palpitaitons, heartburn, abdominal pain, melena, lower extremity edema, claudication, or rash.  All other systems reviewed and negative ? ?General: ?Vitals:  ? 08/15/21 1314  ?BP: 140/60  ?Pulse: (!) 56  ?SpO2: 98%  ? ? ?BP 140/60 (BP Location: Left Arm, Patient Position: Sitting, Cuff Size: Normal)   Pulse (!) 56   Ht 5' 4"  (1.626 m)   Wt 120 lb (54.4 kg)   LMP 07/16/2013   SpO2 98%   BMI 20.60 kg/m?  ? ? ?Affect appropriate ?Healthy:  appears stated age ?HEENT: normal ?Neck supple with no adenopathy ?JVP  normal no bruits no thyromegaly ?Lungs clear with no wheezing and good diaphragmatic motion ?Heart:  V5/I4 click SEM through AVR  No AR  murmur, no rub, gallop or click ?PMI normal Post left thoracotomy and sternotomy. Coarctation murmur radiates to back  ?Abdomen: benighn, BS positve, no tenderness, no AAA ?no bruit.  No HSM or HJR ?Distal pulses intact with no bruits ?No edema ?Neuro non-focal ?Skin vasculitic rash arms and legs  Cystic mass back of right thigh ?With right knee effusion and cyst above knee  ?Post left knee surgery at age 7 not replacement  ? ? ?Current Outpatient Medications  ?Medication Sig Dispense Refill  ? aspirin EC 81 MG tablet Take 81 mg by mouth daily.    ? atorvastatin (LIPITOR) 10 MG tablet Take 10 mg by  mouth daily.    ? benzonatate (TESSALON PERLES) 100 MG capsule Take 1 capsule (100 mg total) by mouth 3 (three) times daily as needed for cough. 30 capsule 1  ? budesonide-formoterol (SYMBICORT) 160-4.5 MCG/ACT inhaler Inhale 2 puffs into the lungs 2 (two) times daily. 1 each 3  ? chlorpheniramine-HYDROcodone 10-8 MG/5ML Take 5 mLs by mouth every 12 (twelve) hours as needed for cough. 230 mL 0  ? dextromethorphan (DELSYM) 30 MG/5ML liquid Take 5 mLs (30 mg total) by mouth every 12 (twelve) hours as needed for cough. 89 mL 0  ? diazepam (VALIUM) 10 MG tablet Take 10 mg by mouth daily as needed.    ? furosemide (LASIX) 40 MG tablet Take 80 mg by mouth daily.    ? gabapentin (NEURONTIN) 300 MG capsule Take 300 mg by mouth See admin instructions. Take 300 mg in the morning, 300 mg in the afternoon   600 mg at bedtime    ? HYDROcodone-acetaminophen (NORCO/VICODIN) 5-325 MG tablet Take 2 tablets by mouth 2 (two) times daily.    ? Ipratropium-Albuterol (COMBIVENT) 20-100 MCG/ACT AERS respimat Inhale 1 puff into the lungs every 6 (six) hours as needed for wheezing or shortness of breath. 4 g 3  ? metoprolol succinate (TOPROL-XL) 25 MG 24 hr tablet TAKE 1 TABLET BY MOUTH ONCE DAILY. 90 tablet 3  ? Multiple Vitamin (MULTI-VITAMINS) TABS Take 1 tablet by mouth daily.     ? ondansetron (ZOFRAN ODT) 4 MG disintegrating tablet Take 1 tablet (4 mg total) by mouth every 8 (eight) hours as needed for nausea or vomiting. 20 tablet 0  ? pantoprazole (PROTONIX) 40 MG tablet Take 1 tablet (40 mg total) by mouth 2 (two) times daily before a meal. 60 tablet 1  ? potassium chloride SA (KLOR-CON) 20 MEQ tablet TAKE (1) TABLET BY MOUTH ONCE DAILY. 90 tablet 1  ? propafenone (RYTHMOL) 300 MG tablet TAKE 1 TABLET BY MOUTH THREE TIMES A DAY. 90 tablet 6  ? sertraline (ZOLOFT) 100 MG tablet Take 100 mg by mouth daily.    ? Tiotropium Bromide Monohydrate (SPIRIVA RESPIMAT) 2.5 MCG/ACT AERS Inhale 2 puffs into the lungs daily. 8 g 0  ? Turmeric  500 MG CAPS Take 500 mg by mouth 3 (three) times a week.    ? warfarin (COUMADIN) 1 MG tablet TAKE 3 TABLETS DAILY, OR AS DIRECTED. 90 tablet 3  ? ?No current facility-administered medications for this visit.  ? ? ?Allergies ? ?Amiodarone and Penicillins ? ?Electrocardiogram:  5/15  SR low voltage ? Old IMI   08/22/14  SR rate 89  Low voltage  Nonspecific ST changes ?10/21/17 SR rate 52 LAFB no acute changes ? ?Assessment  and Plan ? ?PAF:  2016 post MAZE/LAA exclusion stable on propafenone and anticoagulation  ?AVR: Normal function on TTE 07/20/21    ?Coarctation:  Disagree with decision make to not repair coarctation at time of surgery for aneurysm and bicuspid valve at some point will need f/u MRI/MRA  ?HTN:  Well controlled.  Continue current medications and low sodium Dash type diet.   ?Aneursym:  In setting of bicuspid valve post  arch repair with graft.  ?Neuropathy/Rash:  Bilateral painful legs. F/U Rheumatology as her vasculitis may not be ideally Rx She only takes pulsed steroids. She is afraid to take MTX.  ?COPD:  ? From 2nd hand smoke and wood burning stove non smoker F/U pulmonary continue inhalers and oxygen PFTls, walk test and nocturnal oximetry per pulmonary  I did note that her Alpha 1 Antitrypsin was abnormal at 226  ?Edema:  don't think it is cardiac EF normal RV normal no evidence of pulmonary hypertension She has had some edema since diagnosis of vasculitis US LE;s negative DVT in 2021  she has been on Rx coumadin for her valve and DVT unlikely  ? ?F/U pulmonary regarding oxygen and Rx COPD ? ?F/U with me in 6 ? ?Jenkins Rouge ? ?

## 2021-08-15 ENCOUNTER — Encounter: Payer: Self-pay | Admitting: Cardiovascular Disease

## 2021-08-15 ENCOUNTER — Ambulatory Visit (INDEPENDENT_AMBULATORY_CARE_PROVIDER_SITE_OTHER): Payer: 59 | Admitting: Cardiovascular Disease

## 2021-08-15 VITALS — BP 140/60 | HR 56 | Ht 64.0 in | Wt 120.0 lb

## 2021-08-15 DIAGNOSIS — Z5181 Encounter for therapeutic drug level monitoring: Secondary | ICD-10-CM | POA: Diagnosis not present

## 2021-08-15 DIAGNOSIS — I4892 Unspecified atrial flutter: Secondary | ICD-10-CM | POA: Diagnosis not present

## 2021-08-15 DIAGNOSIS — Z7901 Long term (current) use of anticoagulants: Secondary | ICD-10-CM

## 2021-08-15 DIAGNOSIS — Q251 Coarctation of aorta: Secondary | ICD-10-CM

## 2021-08-15 DIAGNOSIS — Z952 Presence of prosthetic heart valve: Secondary | ICD-10-CM

## 2021-08-15 DIAGNOSIS — I4891 Unspecified atrial fibrillation: Secondary | ICD-10-CM | POA: Diagnosis not present

## 2021-08-15 DIAGNOSIS — I776 Arteritis, unspecified: Secondary | ICD-10-CM

## 2021-08-15 NOTE — Patient Instructions (Signed)
Medication Instructions:  ?Your physician recommends that you continue on your current medications as directed. Please refer to the Current Medication list given to you today. ? ?*If you need a refill on your cardiac medications before your next appointment, please call your pharmacy* ? ? ?Lab Work: ?NONE  ? ?If you have labs (blood work) drawn today and your tests are completely normal, you will receive your results only by: ?MyChart Message (if you have MyChart) OR ?A paper copy in the mail ?If you have any lab test that is abnormal or we need to change your treatment, we will call you to review the results. ? ? ?Testing/Procedures: ?NONE  ? ? ?Follow-Up: ?At [facility], you and your health needs are our priority.  As part of our continuing mission to provide you with exceptional heart care, we have created designated Provider Care Teams.  These Care Teams include your primary Cardiologist (physician) and Advanced Practice Providers (APPs -  Physician Assistants and Nurse Practitioners) who all work together to provide you with the care you need, when you need it. ? ?We recommend signing up for the patient portal called "MyChart".  Sign up information is provided on this After Visit Summary.  MyChart is used to connect with patients for Virtual Visits (Telemedicine).  Patients are able to view lab/test results, encounter notes, upcoming appointments, etc.  Non-urgent messages can be sent to your provider as well.   ?To learn more about what you can do with MyChart, go to [email or web address].   ? ?Your next appointment:   ? 10/07/21 ? ?The format for your next appointment:   ?In Person ? ?Provider:   ?Jenkins Rouge, MD  ? ? ?Other Instructions ?Thank you for choosing [facility]! ? ? ? ?Important Information About Sugar ? ? ? ? ? ? ?

## 2021-08-19 ENCOUNTER — Ambulatory Visit (INDEPENDENT_AMBULATORY_CARE_PROVIDER_SITE_OTHER): Payer: 59 | Admitting: *Deleted

## 2021-08-19 DIAGNOSIS — I4892 Unspecified atrial flutter: Secondary | ICD-10-CM

## 2021-08-19 DIAGNOSIS — Q231 Congenital insufficiency of aortic valve: Secondary | ICD-10-CM | POA: Diagnosis not present

## 2021-08-19 DIAGNOSIS — Z5181 Encounter for therapeutic drug level monitoring: Secondary | ICD-10-CM | POA: Diagnosis not present

## 2021-08-19 LAB — POCT INR: INR: 4.1 — AB (ref 2.0–3.0)

## 2021-08-19 NOTE — Patient Instructions (Signed)
Hold warfarin tonight then decrease dose to 2 tablets daily except 1 tablet on Sundays and Wednesdays.  ?Recheck in 3 weeks ?

## 2021-09-08 ENCOUNTER — Telehealth: Payer: Self-pay | Admitting: Pulmonary Disease

## 2021-09-08 MED ORDER — SPIRIVA RESPIMAT 2.5 MCG/ACT IN AERS: 2.0000 | INHALATION_SPRAY | Freq: Every day | RESPIRATORY_TRACT | 5 refills | Status: DC

## 2021-09-08 NOTE — Telephone Encounter (Signed)
Rx for Spiriva has been sent to preferred pharmacy for pt. Called and spoke with pt letting her know this had been done and she verbalized understanding. Nothing further needed. ?

## 2021-09-09 ENCOUNTER — Ambulatory Visit (INDEPENDENT_AMBULATORY_CARE_PROVIDER_SITE_OTHER): Payer: 59 | Admitting: *Deleted

## 2021-09-09 DIAGNOSIS — Q231 Congenital insufficiency of aortic valve: Secondary | ICD-10-CM

## 2021-09-09 DIAGNOSIS — Z5181 Encounter for therapeutic drug level monitoring: Secondary | ICD-10-CM

## 2021-09-09 DIAGNOSIS — I4892 Unspecified atrial flutter: Secondary | ICD-10-CM

## 2021-09-09 LAB — POCT INR: INR: 1.9 — AB (ref 2.0–3.0)

## 2021-09-09 NOTE — Patient Instructions (Signed)
Increase warfarin 2 tablets daily except 1 tablet on Sundays  ?Recheck in 3 weeks ?

## 2021-10-01 ENCOUNTER — Ambulatory Visit (INDEPENDENT_AMBULATORY_CARE_PROVIDER_SITE_OTHER): Payer: 59 | Admitting: *Deleted

## 2021-10-01 DIAGNOSIS — I4892 Unspecified atrial flutter: Secondary | ICD-10-CM | POA: Diagnosis not present

## 2021-10-01 DIAGNOSIS — Z5181 Encounter for therapeutic drug level monitoring: Secondary | ICD-10-CM

## 2021-10-01 DIAGNOSIS — Q231 Congenital insufficiency of aortic valve: Secondary | ICD-10-CM | POA: Diagnosis not present

## 2021-10-01 LAB — POCT INR: INR: 1.5 — AB (ref 2.0–3.0)

## 2021-10-01 NOTE — Patient Instructions (Signed)
Take warfarin 3 tablets tonight then increase dose to 2 tablets daily except 3 tablets on Thursdays  Recheck in 2 weeks

## 2021-10-01 NOTE — Progress Notes (Signed)
Patient ID: Mallory Sutton, female   DOB: 01-Apr-1961, 61 y.o.   MRN: [id number]      61 y.o. long term patient followed since 1999. She has had congenital heart disease with coarctation repair at age 7 via left thoracotomy. Known persistent left sided SVC  Followed for long time for bicuspid AV and aortic aneurysm Finally had AVR and arch repair by [name] in 2016. I was upset that he did not bypass her coarctation at the time She had PAF and MAZE with LAA occlusion. She has been stable on propafenone for years and followed by Dr Lovena Le in [place]  She developed a neuropathy and painful ? Vascular rash in her legs 2019 with short course of steroids.   Recent US RLE with complex cystic lesion in posterior upper left thigh   TEE done 08/29/19 showed normal EF she had no AR and mean gradient across her AVR 14 mmHg. She has not had recent MRI/CT imaging of her coarctation but the MLD had been < 1 cm in past     Her care has been compromised in past due to lack of insurance and financial issues She has been sporadic at times with her INR checks being subRx the last month   Vasculitis does not seem well Rx Still with lots of discoloration and some pain in legs Also developing more cysts She needs to have on removed by Dr Constance Haw from back of right thigh as it is pressing on nerve Also has one above right knee with lateral knee effusion Had steroid injection right knee 11/01/20 Seen by rheumatology DR Benjamine Mola 12/31/20  Labs including ESR, ANCA Cryoglobulin and cardiolipin negative   TTE:  07/20/21  EF 60-65% Normal RV mild MR AVR On-X 19 mm mean gradient 29 mmHg trivial PVL DVI 0.44  CXR 07/16/21 Emphysema NAD   BNP 07/20/21 437 BUN/Cr BUN 25 Cr 1.19 K 3.8  Hct 32.5  INR 2.5   Hospitalized 3/15-21/23 for hypoxemic respiratory failure COPD She is a non smoker Wood stove for heat D/c on 2L's oxygen Symbicort and Spiriva Plan to do walk test and consider nocturnal oxymetry She was in NSR throughout and d/c with lasix  80 mg daily   08/15/21 emotionally a wreck She feels helpless with another medical issue and being tied to oxygen. Her husband Mikki Santee is being ugly to her in regard to living his life in the shadow of all her medical problems and he is getting too old to do all his landscaping work and finances have always been tight. I spoke to her daughter Raquel Sarna at that time and son lives with them   Seen by Dr Erin Fulling 10/06/21 PFTls with severe obstruction and moderate diffusion defect Still on 2 L's oxygen   Her dog she shares with daughter Sherron Ales has been sick and having surgery today for ? Obstructed Bowel   ROS: Denies fever, malais, weight loss, blurry vision, decreased visual acuity, cough, sputum, SOB, hemoptysis, pleuritic pain, palpitaitons, heartburn, abdominal pain, melena, lower extremity edema, claudication, or rash.  All other systems reviewed and negative  General: There were no vitals filed for this visit.   LMP 07/16/2013    Affect appropriate Healthy:  appears stated age 89: normal Neck supple with no adenopathy JVP normal no bruits no thyromegaly Lungs clear with no wheezing and good diaphragmatic motion Heart:  C9/S4 click SEM through AVR  No AR  murmur, no rub, gallop or click PMI normal Post left thoracotomy and sternotomy.  Coarctation murmur radiates to back  Abdomen: benighn, BS positve, no tenderness, no AAA no bruit.  No HSM or HJR Distal pulses intact with no bruits No edema Neuro non-focal Skin vasculitic rash arms and legs  Cystic mass back of right thigh With right knee effusion and cyst above knee  Post left knee surgery at age 31 not replacement    Current Outpatient Medications  Medication Sig Dispense Refill   albuterol (VENTOLIN HFA) 108 (90 Base) MCG/ACT inhaler Inhale 2 puffs into the lungs every 6 (six) hours as needed for wheezing or shortness of breath. 8 g 6   aspirin EC 81 MG tablet Take 81 mg by mouth daily.     atorvastatin (LIPITOR) 10 MG tablet Take  10 mg by mouth daily.     benzonatate (TESSALON PERLES) 100 MG capsule Take 1 capsule (100 mg total) by mouth 3 (three) times daily as needed for cough. 30 capsule 1   Budeson-Glycopyrrol-Formoterol (BREZTRI AEROSPHERE) 160-9-4.8 MCG/ACT AERO Inhale 2 puffs into the lungs in the morning and at bedtime. 10.7 g 6   chlorpheniramine-HYDROcodone 10-8 MG/5ML Take 5 mLs by mouth every 12 (twelve) hours as needed for cough. 230 mL 0   dextromethorphan (DELSYM) 30 MG/5ML liquid Take 5 mLs (30 mg total) by mouth every 12 (twelve) hours as needed for cough. 89 mL 0   diazepam (VALIUM) 10 MG tablet Take 10 mg by mouth daily as needed.     furosemide (LASIX) 40 MG tablet Take 80 mg by mouth daily.     gabapentin (NEURONTIN) 300 MG capsule Take 300 mg by mouth See admin instructions. Take 300 mg in the morning, 300 mg in the afternoon   600 mg at bedtime     HYDROcodone-acetaminophen (NORCO/VICODIN) 5-325 MG tablet Take 2 tablets by mouth 2 (two) times daily.     metoprolol succinate (TOPROL-XL) 25 MG 24 hr tablet TAKE 1 TABLET BY MOUTH ONCE DAILY. 90 tablet 3   Multiple Vitamin (MULTI-VITAMINS) TABS Take 1 tablet by mouth daily.      ondansetron (ZOFRAN ODT) 4 MG disintegrating tablet Take 1 tablet (4 mg total) by mouth every 8 (eight) hours as needed for nausea or vomiting. 20 tablet 0   pantoprazole (PROTONIX) 40 MG tablet Take 1 tablet (40 mg total) by mouth 2 (two) times daily before a meal. 60 tablet 1   potassium chloride SA (KLOR-CON) 20 MEQ tablet TAKE (1) TABLET BY MOUTH ONCE DAILY. 90 tablet 1   propafenone (RYTHMOL) 300 MG tablet TAKE 1 TABLET BY MOUTH THREE TIMES A DAY. 90 tablet 6   sertraline (ZOLOFT) 100 MG tablet Take 100 mg by mouth daily.     Turmeric 500 MG CAPS Take 500 mg by mouth 3 (three) times a week.     warfarin (COUMADIN) 1 MG tablet TAKE 3 TABLETS DAILY, OR AS DIRECTED. 90 tablet 3   No current facility-administered medications for this visit.    Allergies  Amiodarone and  Penicillins  Electrocardiogram:  5/15  SR low voltage ? 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  Continue current medications and low sodium Dash type diet.   Aneursym:  In setting of bicuspid valve post  arch repair with graft.  Neuropathy/Rash:  Bilateral painful legs. F/U Rheumatology as her vasculitis may not be ideally Rx She only takes pulsed steroids. She is afraid to take MTX.  COPD:  ? From 2nd hand smoke and wood burning stove non smoker F/U pulmonary continue inhalers and oxygen PFTls,severe obstruction moderate diffusion defect f/u Dr Erin Fulling   Edema:  don't think it is cardiac EF normal RV normal no evidence of pulmonary hypertension She has had some edema since diagnosis of vasculitis US LE;s negative DVT in 2021  she has been on Rx coumadin for her valve and DVT unlikely   F/U pulmonary regarding oxygen and Rx COPD  F/U with me in [place]

## 2021-10-06 ENCOUNTER — Ambulatory Visit (INDEPENDENT_AMBULATORY_CARE_PROVIDER_SITE_OTHER): Payer: 59 | Admitting: Pulmonary Disease

## 2021-10-06 ENCOUNTER — Encounter: Payer: Self-pay | Admitting: Pulmonary Disease

## 2021-10-06 VITALS — BP 126/68 | HR 72 | Ht 64.0 in | Wt 100.4 lb

## 2021-10-06 DIAGNOSIS — J449 Chronic obstructive pulmonary disease, unspecified: Secondary | ICD-10-CM

## 2021-10-06 DIAGNOSIS — J9611 Chronic respiratory failure with hypoxia: Secondary | ICD-10-CM | POA: Diagnosis not present

## 2021-10-06 LAB — PULMONARY FUNCTION TEST
DL/VA % pred: 76 %
DL/VA: 3.21 ml/min/mmHg/L
DLCO cor % pred: 44 %
DLCO cor: 8.99 ml/min/mmHg
DLCO unc % pred: 44 %
DLCO unc: 8.99 ml/min/mmHg
FEF 25-75 Pre: 0.98 L/sec
FEF2575-%Pred-Pre: 41 %
FEV1-%Change-Post: -37 %
FEV1-%Pred-Post: 32 %
FEV1-%Pred-Pre: 51 %
FEV1-Post: 0.83 L
FEV1-Pre: 1.34 L
FEV1FVC-%Change-Post: -6 %
FEV1FVC-%Pred-Pre: 94 %
FEV6-%Change-Post: -33 %
FEV6-%Pred-Post: 37 %
FEV6-%Pred-Pre: 56 %
FEV6-Post: 1.21 L
FEV6-Pre: 1.81 L
FEV6FVC-%Change-Post: 0 %
FEV6FVC-%Pred-Post: 103 %
FEV6FVC-%Pred-Pre: 103 %
FVC-%Change-Post: -33 %
FVC-%Pred-Post: 36 %
FVC-%Pred-Pre: 54 %
FVC-Post: 1.21 L
FVC-Pre: 1.81 L
Post FEV1/FVC ratio: 69 %
Post FEV6/FVC ratio: 100 %
Pre FEV1/FVC ratio: 74 %
Pre FEV6/FVC Ratio: 100 %

## 2021-10-06 MED ORDER — ALBUTEROL SULFATE HFA 108 (90 BASE) MCG/ACT IN AERS: 2.0000 | INHALATION_SPRAY | Freq: Four times a day (QID) | RESPIRATORY_TRACT | 6 refills | Status: DC | PRN

## 2021-10-06 MED ORDER — BREZTRI AEROSPHERE 160-9-4.8 MCG/ACT IN AERO: 2.0000 | INHALATION_SPRAY | Freq: Two times a day (BID) | RESPIRATORY_TRACT | 6 refills | Status: DC

## 2021-10-06 NOTE — Patient Instructions (Signed)
Attempted Full PFT Today. Performed Pre/Post Spirometry and DLCO.  

## 2021-10-06 NOTE — Progress Notes (Signed)
Synopsis: Referred in April 2023 for COPD and hospital follow  Subjective:   PATIENT ID: Mallory Sutton GENDER: female DOB: 25-Mar-1961, MRN: [id number]   HPI  Chief Complaint  Patient presents with   Follow-up    F/U  after PFT. States she is still having problems with SOB with exertion. Still using 2L of O2. Received a sample of Breztri and this has helped.    Mallory Sutton is a 61 year old woman, never smoker with history of congenital heart disease with coarctation repair at age 70, bicuspid AV s/p replacement and aortic arch repair in 2016, PAF s/p MAZE and left atrial appendage occlusion and vasculitis of lower extremities in 2019 who returns to pulmonary clinic for COPD.   She was started on spiriva 2.28mg 2 puffs daily at last visit in addition to her symbicort 160-4.548m 2 puffs twice daily withimprovement of her respiratory symptoms. She has tried breztri per her PCP and prefers this inhaler over trelegy ellipta.   She continues to require continuous oxygen at 2L. PFTs today show severe obstruction with moderate diffusion defect.   Initial OV 08/05/21 [facility] to [facility] for acute hypoxemic respiratory failure with concern for COPD exacerbation.  Patient does not carry history of COPD and is a never smoker.  She reports significant secondhand smoke exposure in childhood from her parents and throughout her life from her husband and son.  She also reports they have had a wood stove as their home heating source for many years.  Prior to last fall she only noticed shortness of breath when she was in atrial fibrillation.  She denies any history of cough or wheezing.  She reports her cough and wheezing during the hospital ideation is much improved.  She has increased cough when she lays down.  She denies any ongoing fevers, chills or sweats.  She is currently using Symbicort 1 puff in the morning and 1 puff at night.  She has an as needed Combivent inhaler.  She  received nebulizer treatments in the hospital which she prefers over the inhaler therapy.  She reports family history of lung cancer in her grandfather.  Past Medical History:  Diagnosis Date   Aortic stenosis    ATRIAL ARRHYTHMIAS    Bicuspid aortic valve    CHF (congestive heart failure) (HCC)    COARCTATION OF AORTA    ENDOMETRIOSIS    Heart murmur    Paroxysmal atrial fibrillation (HCC)    Shortness of breath    THORACIC AORTIC ANEURYSM      Family History  Problem Relation Age of Onset   Suicidality Mother    Mental illness Mother    Drug abuse Brother    Healthy Daughter    Healthy Son      Social History   Socioeconomic History   Marital status: Married    Spouse name: Not on file   Number of children: Not on file   Years of education: Not on file   Highest education level: Not on file  Occupational History   Not on file  Tobacco Use   Smoking status: Never    Passive exposure: Past   Smokeless tobacco: Never  Vaping Use   Vaping Use: Never used  Substance and Sexual Activity   Alcohol use: Yes    Alcohol/week: 0.0 standard drinks    Comment: 3 beers 3 nights a week   Drug use: No   Sexual activity: Never  Other  Topics Concern   Not on file  Social History Narrative   Not on file   Social Determinants of Health   Financial Resource Strain: Not on file  Food Insecurity: Not on file  Transportation Needs: Not on file  Physical Activity: Not on file  Stress: Not on file  Social Connections: Not on file  Intimate Partner Violence: Not on file     Allergies  Allergen Reactions   Amiodarone Nausea Only   Penicillins Hives    Has patient had a PCN reaction causing immediate rash, facial/tongue/throat swelling, SOB or lightheadedness with hypotension: YES Has patient had a PCN reaction causing severe rash involving mucus membranes or skin necrosis: NO Has patient had a PCN reaction that required hospitalization NO Has patient had a PCN reaction  occurring within the last 10 years: NO If all of the above answers are "NO", then may proceed with Cephalosporin use.      Outpatient Medications Prior to Visit  Medication Sig Dispense Refill   aspirin EC 81 MG tablet Take 81 mg by mouth daily.     atorvastatin (LIPITOR) 10 MG tablet Take 10 mg by mouth daily.     benzonatate (TESSALON PERLES) 100 MG capsule Take 1 capsule (100 mg total) by mouth 3 (three) times daily as needed for cough. 30 capsule 1   chlorpheniramine-HYDROcodone 10-8 MG/5ML Take 5 mLs by mouth every 12 (twelve) hours as needed for cough. 230 mL 0   dextromethorphan (DELSYM) 30 MG/5ML liquid Take 5 mLs (30 mg total) by mouth every 12 (twelve) hours as needed for cough. 89 mL 0   diazepam (VALIUM) 10 MG tablet Take 10 mg by mouth daily as needed.     furosemide (LASIX) 40 MG tablet Take 80 mg by mouth daily.     gabapentin (NEURONTIN) 300 MG capsule Take 300 mg by mouth See admin instructions. Take 300 mg in the morning, 300 mg in the afternoon   600 mg at bedtime     HYDROcodone-acetaminophen (NORCO/VICODIN) 5-325 MG tablet Take 2 tablets by mouth 2 (two) times daily.     metoprolol succinate (TOPROL-XL) 25 MG 24 hr tablet TAKE 1 TABLET BY MOUTH ONCE DAILY. 90 tablet 3   Multiple Vitamin (MULTI-VITAMINS) TABS Take 1 tablet by mouth daily.      ondansetron (ZOFRAN ODT) 4 MG disintegrating tablet Take 1 tablet (4 mg total) by mouth every 8 (eight) hours as needed for nausea or vomiting. 20 tablet 0   pantoprazole (PROTONIX) 40 MG tablet Take 1 tablet (40 mg total) by mouth 2 (two) times daily before a meal. 60 tablet 1   potassium chloride SA (KLOR-CON) 20 MEQ tablet TAKE (1) TABLET BY MOUTH ONCE DAILY. 90 tablet 1   propafenone (RYTHMOL) 300 MG tablet TAKE 1 TABLET BY MOUTH THREE TIMES A DAY. 90 tablet 6   sertraline (ZOLOFT) 100 MG tablet Take 100 mg by mouth daily.     Turmeric 500 MG CAPS Take 500 mg by mouth 3 (three) times a week.     warfarin (COUMADIN) 1 MG tablet  TAKE 3 TABLETS DAILY, OR AS DIRECTED. 90 tablet 3   budesonide-formoterol (SYMBICORT) 160-4.5 MCG/ACT inhaler Inhale 2 puffs into the lungs 2 (two) times daily. 1 each 3   Ipratropium-Albuterol (COMBIVENT) 20-100 MCG/ACT AERS respimat Inhale 1 puff into the lungs every 6 (six) hours as needed for wheezing or shortness of breath. 4 g 3   Tiotropium Bromide Monohydrate (SPIRIVA RESPIMAT) 2.5 MCG/ACT AERS Inhale 2  puffs into the lungs daily. 8 g 5   No facility-administered medications prior to visit.    Review of Systems  Constitutional:  Negative for chills, fever, malaise/fatigue and weight loss.  HENT:  Negative for congestion, sinus pain and sore throat.   Eyes: Negative.   Respiratory:  Positive for shortness of breath. Negative for cough, hemoptysis, sputum production and wheezing.   Cardiovascular:  Negative for chest pain, palpitations, orthopnea, claudication and leg swelling.  Gastrointestinal:  Negative for abdominal pain, heartburn, nausea and vomiting.  Genitourinary: Negative.   Musculoskeletal:  Negative for joint pain and myalgias.  Skin:  Negative for rash.  Neurological:  Negative for weakness.  Endo/Heme/Allergies: Negative.   Psychiatric/Behavioral:  Positive for depression. The patient is nervous/anxious.      Objective:   Vitals:   10/06/21 1516  BP: 126/68  Pulse: 72  SpO2: 100%  Weight: 100 lb 6.4 oz (45.5 kg)  Height: '5\' 4"'$  (1.626 m)     Physical Exam Constitutional:      General: She is not in acute distress.    Appearance: She is not ill-appearing.     Comments: thin  HENT:     Head: Normocephalic and atraumatic.  Eyes:     General: No scleral icterus.    Conjunctiva/sclera: Conjunctivae normal.     Pupils: Pupils are equal, round, and reactive to light.  Cardiovascular:     Rate and Rhythm: Normal rate and regular rhythm.     Pulses: Normal pulses.     Heart sounds: Normal heart sounds. No murmur heard. Pulmonary:     Effort: Pulmonary  effort is normal.     Breath sounds: Normal breath sounds. Decreased air movement present. No wheezing, rhonchi or rales.  Abdominal:     General: Bowel sounds are normal.     Palpations: Abdomen is soft.  Musculoskeletal:     Right lower leg: No edema.     Left lower leg: No edema.  Lymphadenopathy:     Cervical: No cervical adenopathy.  Skin:    General: Skin is warm and dry.     Comments: Bella Kennedy appearing erythema of skin of bilateral lower extremities from hx of vasculitis  Neurological:     General: No focal deficit present.     Mental Status: She is alert.  Psychiatric:        Mood and Affect: Mood normal.        Behavior: Behavior normal.        Thought Content: Thought content normal.        Judgment: Judgment normal.   CBC    Component Value Date/Time   WBC 9.6 07/21/2021 0527   RBC 3.51 (L) 07/21/2021 0527   HGB 10.0 (L) 07/21/2021 0527   HGB 14.4 07/05/2019 1653   HCT 32.5 (L) 07/21/2021 0527   HCT 44.2 07/05/2019 1653   PLT 235 07/21/2021 0527   PLT 479 (H) 07/05/2019 1653   MCV 92.6 07/21/2021 0527   MCV 87 07/05/2019 1653   MCH 28.5 07/21/2021 0527   MCHC 30.8 07/21/2021 0527   RDW 14.6 07/21/2021 0527   RDW 13.6 07/05/2019 1653   LYMPHSABS 1.8 07/20/2021 0805   MONOABS 0.5 07/20/2021 0805   EOSABS 0.0 07/20/2021 0805   BASOSABS 0.0 07/20/2021 0805      Latest Ref Rng & Units 07/21/2021    5:27 AM 07/19/2021    2:52 PM 07/19/2021    5:12 AM  BMP  Glucose 70 - 99 mg/dL  116   102   118    BUN 6 - 20 mg/dL '25   23   23    '$ Creatinine 0.44 - 1.00 mg/dL 1.19   0.99   0.83    Sodium 135 - 145 mmol/L 138   140   142    Potassium 3.5 - 5.1 mmol/L 3.8   3.6   4.0    Chloride 98 - 111 mmol/L 94   95   97    CO2 22 - 32 mmol/L 34   34   32    Calcium 8.9 - 10.3 mg/dL 8.2   9.1   8.9     Chest imaging: CXR 07/16/21 The cardio pericardial silhouette is enlarged. Lungs are hyperexpanded Interstitial markings are diffusely coarsened with chronic features. The  visualized bony structures of the thorax are unremarkable. Telemetry leads overlie the chest.  PFT:    Latest Ref Rng & Units 10/06/2021    2:00 PM  PFT Results  FVC-Pre L 1.81    FVC-Predicted Pre % 54    FVC-Post L 1.21    FVC-Predicted Post % 36    Pre FEV1/FVC % % 74    Post FEV1/FCV % % 69    FEV1-Pre L 1.34    FEV1-Predicted Pre % 51    FEV1-Post L 0.83    DLCO uncorrected ml/min/mmHg 8.99    DLCO UNC% % 44    DLCO corrected ml/min/mmHg 8.99    DLCO COR %Predicted % 44    DLVA Predicted % 76    PFT 2023: Severe obstruction and moderate diffusion defect  Labs:  Path:  Echo 07/20/21: LVEF 60 to 65%.  Moderate LVH.  RV systolic function and size are normal.  Mildly elevated pulmonary artery systolic pressure.  Left atrial size is moderately dilated.  Right atrial size is mildly dilated.  Heart Catheterization:  Assessment & Plan:   Chronic obstructive pulmonary disease, unspecified COPD type ([place]) - Plan: Budeson-Glycopyrrol-Formoterol (BREZTRI AEROSPHERE) 160-9-4.8 MCG/ACT AERO, albuterol (VENTOLIN HFA) 108 (90 Base) MCG/ACT inhaler  Chronic hypoxemic respiratory failure (HCC)  Discussion: Mallory Sutton is a 61 year old woman, never smoker with history of congenital heart disease with coarctation repair at age 29, bicuspid AV s/p replacement and aortic arch repair in 2016, PAF s/p MAZE and left atrial appendage occlusion and vasculitis of lower extremities in 2019 who returns to pulmonary clinic for COPD.   She has severe obstruction and moderate diffusion defect on pulmonary function tests. Her risk factors include significant secondhand smoke exposure along with chronic exposure to wood-burning furnace.  She is to start breztri inhaler 2 puffs twice daily and as needed albuterol.   She is to continue supplemental oxygen 2 L throughout the day and night at this time.  We will place order to DME to have her evaluated for portable oxygen concentrator.  Follow-up in 6  months.  Freda Jackson, MD [facility] Office: [phone]   Current Outpatient Medications:    albuterol (VENTOLIN HFA) 108 (90 Base) MCG/ACT inhaler, Inhale 2 puffs into the lungs every 6 (six) hours as needed for wheezing or shortness of breath., Disp: 8 g, Rfl: 6   aspirin EC 81 MG tablet, Take 81 mg by mouth daily., Disp: , Rfl:    atorvastatin (LIPITOR) 10 MG tablet, Take 10 mg by mouth daily., Disp: , Rfl:    benzonatate (TESSALON PERLES) 100 MG capsule, Take 1 capsule (100 mg total) by mouth 3 (three)  times daily as needed for cough., Disp: 30 capsule, Rfl: 1   Budeson-Glycopyrrol-Formoterol (BREZTRI AEROSPHERE) 160-9-4.8 MCG/ACT AERO, Inhale 2 puffs into the lungs in the morning and at bedtime., Disp: 10.7 g, Rfl: 6   chlorpheniramine-HYDROcodone 10-8 MG/5ML, Take 5 mLs by mouth every 12 (twelve) hours as needed for cough., Disp: 230 mL, Rfl: 0   dextromethorphan (DELSYM) 30 MG/5ML liquid, Take 5 mLs (30 mg total) by mouth every 12 (twelve) hours as needed for cough., Disp: 89 mL, Rfl: 0   diazepam (VALIUM) 10 MG tablet, Take 10 mg by mouth daily as needed., Disp: , Rfl:    furosemide (LASIX) 40 MG tablet, Take 80 mg by mouth daily., Disp: , Rfl:    gabapentin (NEURONTIN) 300 MG capsule, Take 300 mg by mouth See admin instructions. Take 300 mg in the morning, 300 mg in the afternoon   600 mg at bedtime, Disp: , Rfl:    HYDROcodone-acetaminophen (NORCO/VICODIN) 5-325 MG tablet, Take 2 tablets by mouth 2 (two) times daily., Disp: , Rfl:    metoprolol succinate (TOPROL-XL) 25 MG 24 hr tablet, TAKE 1 TABLET BY MOUTH ONCE DAILY., Disp: 90 tablet, Rfl: 3   Multiple Vitamin (MULTI-VITAMINS) TABS, Take 1 tablet by mouth daily. , Disp: , Rfl:    ondansetron (ZOFRAN ODT) 4 MG disintegrating tablet, Take 1 tablet (4 mg total) by mouth every 8 (eight) hours as needed for nausea or vomiting., Disp: 20 tablet, Rfl: 0   pantoprazole (PROTONIX) 40 MG tablet, Take 1 tablet (40  mg total) by mouth 2 (two) times daily before a meal., Disp: 60 tablet, Rfl: 1   potassium chloride SA (KLOR-CON) 20 MEQ tablet, TAKE (1) TABLET BY MOUTH ONCE DAILY., Disp: 90 tablet, Rfl: 1   propafenone (RYTHMOL) 300 MG tablet, TAKE 1 TABLET BY MOUTH THREE TIMES A DAY., Disp: 90 tablet, Rfl: 6   sertraline (ZOLOFT) 100 MG tablet, Take 100 mg by mouth daily., Disp: , Rfl:    Turmeric 500 MG CAPS, Take 500 mg by mouth 3 (three) times a week., Disp: , Rfl:    warfarin (COUMADIN) 1 MG tablet, TAKE 3 TABLETS DAILY, OR AS DIRECTED., Disp: 90 tablet, Rfl: 3

## 2021-10-06 NOTE — Progress Notes (Signed)
Attempted Full PFT Today. Performed Pre/Post Spirometry and DLCO.  

## 2021-10-06 NOTE — Patient Instructions (Addendum)
Continue Breztri inhaler 2 puffs twice daily - rinse mouth out after each use  Use albuterol inhaler 1-2 puffs every 4-6 hours as needed  Continue on supplemental oxygen We will place order for [employer] to test you for a portable oxygen concentrator.    Follow up in 6 months

## 2021-10-07 ENCOUNTER — Encounter: Payer: Self-pay | Admitting: Cardiovascular Disease

## 2021-10-07 ENCOUNTER — Ambulatory Visit (INDEPENDENT_AMBULATORY_CARE_PROVIDER_SITE_OTHER): Payer: 59 | Admitting: Cardiovascular Disease

## 2021-10-07 VITALS — BP 140/82 | HR 70 | Ht 64.0 in | Wt 100.2 lb

## 2021-10-07 DIAGNOSIS — Z7901 Long term (current) use of anticoagulants: Secondary | ICD-10-CM

## 2021-10-07 DIAGNOSIS — Z952 Presence of prosthetic heart valve: Secondary | ICD-10-CM | POA: Diagnosis not present

## 2021-10-07 DIAGNOSIS — Q251 Coarctation of aorta: Secondary | ICD-10-CM

## 2021-10-07 DIAGNOSIS — Z5181 Encounter for therapeutic drug level monitoring: Secondary | ICD-10-CM

## 2021-10-07 DIAGNOSIS — I4892 Unspecified atrial flutter: Secondary | ICD-10-CM

## 2021-10-07 DIAGNOSIS — I776 Arteritis, unspecified: Secondary | ICD-10-CM

## 2021-10-07 DIAGNOSIS — I4891 Unspecified atrial fibrillation: Secondary | ICD-10-CM

## 2021-10-07 NOTE — Patient Instructions (Signed)
Medication Instructions:  Your physician recommends that you continue on your current medications as directed. Please refer to the Current Medication list given to you today.  *If you need a refill on your cardiac medications before your next appointment, please call your pharmacy*   Lab Work: NONE   If you have labs (blood work) drawn today and your tests are completely normal, you will receive your results only by: MyChart Message (if you have MyChart) OR A paper copy in the mail If you have any lab test that is abnormal or we need to change your treatment, we will call you to review the results.   Testing/Procedures: NONE    Follow-Up: At [facility], you and your health needs are our priority.  As part of our continuing mission to provide you with exceptional heart care, we have created designated Provider Care Teams.  These Care Teams include your primary Cardiologist (physician) and Advanced Practice Providers (APPs -  Physician Assistants and Nurse Practitioners) who all work together to provide you with the care you need, when you need it.  We recommend signing up for the patient portal called "MyChart".  Sign up information is provided on this After Visit Summary.  MyChart is used to connect with patients for Virtual Visits (Telemedicine).  Patients are able to view lab/test results, encounter notes, upcoming appointments, etc.  Non-urgent messages can be sent to your provider as well.   To learn more about what you can do with MyChart, go to [email or web address].    Your next appointment:   6 month(s)  The format for your next appointment:   In Person  Provider:   Peter Nishan, MD    Other Instructions Thank you for choosing [facility]!    Important Information About Sugar       

## 2021-10-08 ENCOUNTER — Encounter: Payer: Self-pay | Admitting: Cardiovascular Disease

## 2021-10-16 ENCOUNTER — Ambulatory Visit (INDEPENDENT_AMBULATORY_CARE_PROVIDER_SITE_OTHER): Payer: 59 | Admitting: *Deleted

## 2021-10-16 DIAGNOSIS — I4892 Unspecified atrial flutter: Secondary | ICD-10-CM

## 2021-10-16 DIAGNOSIS — Q231 Congenital insufficiency of aortic valve: Secondary | ICD-10-CM

## 2021-10-16 DIAGNOSIS — Z5181 Encounter for therapeutic drug level monitoring: Secondary | ICD-10-CM | POA: Diagnosis not present

## 2021-10-16 LAB — POCT INR: INR: 4.7 — AB (ref 2.0–3.0)

## 2021-10-16 NOTE — Patient Instructions (Signed)
Hold warfarin tonight, take 1 tablet tomorrow night then decrease dose to 2 tablets daily  Recheck in 2 weeks

## 2021-11-05 ENCOUNTER — Encounter: Payer: Self-pay | Admitting: Orthopedic Surgery

## 2021-11-05 ENCOUNTER — Ambulatory Visit (INDEPENDENT_AMBULATORY_CARE_PROVIDER_SITE_OTHER): Payer: 59 | Admitting: Orthopedic Surgery

## 2021-11-05 VITALS — Ht 64.0 in | Wt 100.0 lb

## 2021-11-05 DIAGNOSIS — M25561 Pain in right knee: Secondary | ICD-10-CM | POA: Diagnosis not present

## 2021-11-05 DIAGNOSIS — M25562 Pain in left knee: Secondary | ICD-10-CM

## 2021-11-05 DIAGNOSIS — G8929 Other chronic pain: Secondary | ICD-10-CM | POA: Diagnosis not present

## 2021-11-05 NOTE — Patient Instructions (Signed)

## 2021-11-06 ENCOUNTER — Encounter: Payer: Self-pay | Admitting: Orthopedic Surgery

## 2021-11-06 ENCOUNTER — Ambulatory Visit (INDEPENDENT_AMBULATORY_CARE_PROVIDER_SITE_OTHER): Payer: 59 | Admitting: *Deleted

## 2021-11-06 DIAGNOSIS — Q231 Congenital insufficiency of aortic valve: Secondary | ICD-10-CM

## 2021-11-06 DIAGNOSIS — I4892 Unspecified atrial flutter: Secondary | ICD-10-CM | POA: Diagnosis not present

## 2021-11-06 DIAGNOSIS — Z5181 Encounter for therapeutic drug level monitoring: Secondary | ICD-10-CM | POA: Diagnosis not present

## 2021-11-06 LAB — POCT INR: INR: 3.9 — AB (ref 2.0–3.0)

## 2021-11-06 NOTE — Patient Instructions (Signed)
Hold warfarin tonight then decrease dose to 2 tablets daily except 1 tablet on Sundays Recheck in 2 weeks

## 2021-11-06 NOTE — Progress Notes (Signed)
Return patient Visit  Assessment: Mallory Sutton is a 61 y.o. female with the following: 1. Chronic pain of bilateral knees  Plan: [name] continues to have bilateral knee pain.  She has obvious effusions on physical exam today.  Particularly in her left knee, the effusion is limiting her range of motion.  The effusion in her right knee has returned, after prior aspiration and injection.  Given the swelling, as well as the pain in both knees, she would like to proceed with aspiration of bilateral knees, with bilateral knee injections.  This visit completed in clinic today without issues.  She noted immediate improvement in her pain, as well as her range of motion.  Procedure note aspiration/injection Right knee joint   Verbal consent was obtained to inject the right knee joint  Timeout was completed to confirm the site of injection.  The skin was prepped with alcohol and ethyl chloride was sprayed at the injection site.  An 18-gauge needle was introduced into the superior lateral aspect of the right knee and approximately 55 cc of clear joint fluid was aspirated.  Using the same needle, 40 mg of Depo-Medrol and 1% lidocaine (3 cc) into the right knee. There were no complications. A sterile bandage was applied.  Procedure note aspiration/injection left knee joint   Verbal consent was obtained to inject the left knee joint  Timeout was completed to confirm the site of injection.  The skin was prepped with alcohol and ethyl chloride was sprayed at the injection site.  An 18-gauge needle was introduced into the superior lateral aspect of the left knee and approximately 45 cc of clear joint fluid, with small amount of blood towards the end was aspirated.  Using the same needle, 40 mg of Depo-Medrol and 1% lidocaine (3 cc) into the right knee. There were no complications. A sterile bandage was applied.  Ace wrap were applied to both knees.  Follow-up: Return if symptoms worsen or fail to  improve.  Subjective:  Chief Complaint  Patient presents with   Knee Pain    Bilat L>R     History of Present Illness: Mallory Sutton is a 61 y.o. female who has returned to clinic today for evaluation of bilateral knee pain.  She has a history of bilateral knee pain.  I saw her several months ago for right knee pain, at which time I aspirated the knee, as well as injected her knee.  This improved her pain, as well as her symptoms overall.  Since then, she has had progressively worsening pain.  She has noted swelling in both knees.  She does have a history of remote surgery in her left knee, but states she is unclear what she had done.  In addition, she has had multiple medical issues over the past year, and has spent considerable time hospitalized.  She is now using oxygen at baseline.  She has noticed swelling, and would like to have both knees aspirated.    Review of Systems: No fevers or chills No numbness or tingling No chest pain + shortness of breath No bowel or bladder dysfunction No GI distress No headaches      Objective: Ht '5\' 4"'$  (1.626 m)   Wt 100 lb (45.4 kg)   LMP 07/16/2013   BMI 17.16 kg/m   Physical Exam:  General: Alert and oriented. and No acute distress. Gait: Slow steady gait.  Thin female.  Right knee with moderate effusion, left knee with severe effusion.  Well-healed  anterior based surgical incision overlying the left knee, without surrounding erythema or drainage.  She is able to achieve full extension of the left knee, with flexion limited to about 90 degrees.  Full extension of the right knee, flexion beyond 110 .  Negative Lachman.  No increased laxity varus valgus stress bilaterally.   IMAGING: I personally ordered and reviewed the following images  No new imaging obtained today.     New Medications:  No orders of the defined types were placed in this encounter.     Mordecai Rasmussen, MD  11/06/2021 4:08 PM

## 2021-11-08 ENCOUNTER — Emergency Department (HOSPITAL_COMMUNITY): Payer: 59

## 2021-11-08 ENCOUNTER — Other Ambulatory Visit: Payer: Self-pay

## 2021-11-08 ENCOUNTER — Encounter (HOSPITAL_COMMUNITY): Payer: Self-pay | Admitting: *Deleted

## 2021-11-08 ENCOUNTER — Observation Stay (HOSPITAL_COMMUNITY)
Admission: EM | Admit: 2021-11-08 | Discharge: 2021-11-10 | Disposition: A | Discharge status: Home / Self Care | Payer: 59 | Attending: Internal Medicine | Admitting: Internal Medicine

## 2021-11-08 DIAGNOSIS — E876 Hypokalemia: Secondary | ICD-10-CM | POA: Diagnosis not present

## 2021-11-08 DIAGNOSIS — Z954 Presence of other heart-valve replacement: Secondary | ICD-10-CM | POA: Diagnosis not present

## 2021-11-08 DIAGNOSIS — Z79899 Other long term (current) drug therapy: Secondary | ICD-10-CM | POA: Diagnosis not present

## 2021-11-08 DIAGNOSIS — R200 Anesthesia of skin: Secondary | ICD-10-CM

## 2021-11-08 DIAGNOSIS — Z96653 Presence of artificial knee joint, bilateral: Secondary | ICD-10-CM | POA: Diagnosis not present

## 2021-11-08 DIAGNOSIS — K219 Gastro-esophageal reflux disease without esophagitis: Secondary | ICD-10-CM | POA: Insufficient documentation

## 2021-11-08 DIAGNOSIS — E43 Unspecified severe protein-calorie malnutrition: Secondary | ICD-10-CM | POA: Diagnosis not present

## 2021-11-08 DIAGNOSIS — Z8679 Personal history of other diseases of the circulatory system: Secondary | ICD-10-CM | POA: Diagnosis not present

## 2021-11-08 DIAGNOSIS — I4891 Unspecified atrial fibrillation: Secondary | ICD-10-CM | POA: Diagnosis present

## 2021-11-08 DIAGNOSIS — I5032 Chronic diastolic (congestive) heart failure: Secondary | ICD-10-CM | POA: Insufficient documentation

## 2021-11-08 DIAGNOSIS — Z7982 Long term (current) use of aspirin: Secondary | ICD-10-CM | POA: Insufficient documentation

## 2021-11-08 DIAGNOSIS — F32A Depression, unspecified: Secondary | ICD-10-CM | POA: Diagnosis present

## 2021-11-08 DIAGNOSIS — Z7901 Long term (current) use of anticoagulants: Secondary | ICD-10-CM | POA: Diagnosis not present

## 2021-11-08 DIAGNOSIS — D72829 Elevated white blood cell count, unspecified: Secondary | ICD-10-CM | POA: Insufficient documentation

## 2021-11-08 DIAGNOSIS — I480 Paroxysmal atrial fibrillation: Secondary | ICD-10-CM | POA: Diagnosis not present

## 2021-11-08 DIAGNOSIS — I359 Nonrheumatic aortic valve disorder, unspecified: Secondary | ICD-10-CM | POA: Diagnosis present

## 2021-11-08 DIAGNOSIS — R202 Paresthesia of skin: Secondary | ICD-10-CM | POA: Diagnosis present

## 2021-11-08 LAB — URINALYSIS, ROUTINE W REFLEX MICROSCOPIC
Bilirubin Urine: NEGATIVE
Glucose, UA: NEGATIVE mg/dL
Hgb urine dipstick: NEGATIVE
Ketones, ur: NEGATIVE mg/dL
Leukocytes,Ua: NEGATIVE
Nitrite: NEGATIVE
Protein, ur: NEGATIVE mg/dL
Specific Gravity, Urine: 1.01 (ref 1.005–1.030)
pH: 7 (ref 5.0–8.0)

## 2021-11-08 LAB — MAGNESIUM: Magnesium: 2.3 mg/dL (ref 1.7–2.4)

## 2021-11-08 LAB — COMPREHENSIVE METABOLIC PANEL
ALT: 23 U/L (ref 0–44)
AST: 42 U/L — ABNORMAL HIGH (ref 15–41)
Albumin: 4.1 g/dL (ref 3.5–5.0)
Alkaline Phosphatase: 89 U/L (ref 38–126)
Anion gap: 12 (ref 5–15)
BUN: 20 mg/dL (ref 6–20)
CO2: 29 mmol/L (ref 22–32)
Calcium: 8.8 mg/dL — ABNORMAL LOW (ref 8.9–10.3)
Chloride: 100 mmol/L (ref 98–111)
Creatinine, Ser: 0.96 mg/dL (ref 0.44–1.00)
GFR, Estimated: >60 mL/min (ref >60)
Glucose, Bld: 82 mg/dL (ref 70–99)
Potassium: 2.3 mmol/L — CL (ref 3.5–5.1)
Sodium: 141 mmol/L (ref 135–145)
Total Bilirubin: 0.8 mg/dL (ref 0.3–1.2)
Total Protein: 7.5 g/dL (ref 6.5–8.1)

## 2021-11-08 LAB — DIFFERENTIAL
Abs Immature Granulocytes: 0.1 10*3/uL — ABNORMAL HIGH (ref 0.00–0.07)
Basophils Absolute: 0.1 10*3/uL (ref 0.0–0.1)
Basophils Relative: 1 %
Eosinophils Absolute: 0.1 10*3/uL (ref 0.0–0.5)
Eosinophils Relative: 1 %
Immature Granulocytes: 1 %
Lymphocytes Relative: 31 %
Lymphs Abs: 4.1 10*3/uL — ABNORMAL HIGH (ref 0.7–4.0)
Monocytes Absolute: 1.4 10*3/uL — ABNORMAL HIGH (ref 0.1–1.0)
Monocytes Relative: 11 %
Neutro Abs: 7.4 10*3/uL (ref 1.7–7.7)
Neutrophils Relative %: 55 %

## 2021-11-08 LAB — CBC
HCT: 38.1 % (ref 36.0–46.0)
Hemoglobin: 12.4 g/dL (ref 12.0–15.0)
MCH: 27.5 pg (ref 26.0–34.0)
MCHC: 32.5 g/dL (ref 30.0–36.0)
MCV: 84.5 fL (ref 80.0–100.0)
Platelets: 340 10*3/uL (ref 150–400)
RBC: 4.51 MIL/uL (ref 3.87–5.11)
RDW: 13.2 % (ref 11.5–15.5)
WBC: 13.2 10*3/uL — ABNORMAL HIGH (ref 4.0–10.5)
nRBC: 0 % (ref 0.0–0.2)

## 2021-11-08 LAB — PROTIME-INR
INR: 2.1 — ABNORMAL HIGH (ref 0.8–1.2)
Prothrombin Time: 23 seconds — ABNORMAL HIGH (ref 11.4–15.2)

## 2021-11-08 LAB — APTT: aPTT: 29 seconds (ref 24–36)

## 2021-11-08 LAB — I-STAT CHEM 8, ED
BUN: 19 mg/dL (ref 6–20)
Calcium, Ion: 0.96 mmol/L — ABNORMAL LOW (ref 1.15–1.40)
Chloride: 99 mmol/L (ref 98–111)
Creatinine, Ser: 0.9 mg/dL (ref 0.44–1.00)
Glucose, Bld: 82 mg/dL (ref 70–99)
HCT: 40 % (ref 36.0–46.0)
Hemoglobin: 13.6 g/dL (ref 12.0–15.0)
Potassium: 2.3 mmol/L — CL (ref 3.5–5.1)
Sodium: 142 mmol/L (ref 135–145)
TCO2: 28 mmol/L (ref 22–32)

## 2021-11-08 LAB — RAPID URINE DRUG SCREEN, HOSP PERFORMED
Amphetamines: NOT DETECTED
Barbiturates: NOT DETECTED
Benzodiazepines: POSITIVE — AB
Cocaine: NOT DETECTED
Opiates: POSITIVE — AB
Tetrahydrocannabinol: POSITIVE — AB

## 2021-11-08 LAB — ETHANOL: Alcohol, Ethyl (B): <10 mg/dL (ref <10)

## 2021-11-08 MED ORDER — GABAPENTIN 300 MG PO CAPS
600.0000 mg | ORAL_CAPSULE | Freq: Every day | ORAL | Status: DC
  Administered 2021-11-09: 600 mg via ORAL
  Filled 2021-11-08: qty 2

## 2021-11-08 MED ORDER — POTASSIUM CHLORIDE IN NACL 20-0.9 MEQ/L-% IV SOLN
Freq: Once | INTRAVENOUS | Status: AC
  Filled 2021-11-08: qty 1000

## 2021-11-08 MED ORDER — OXYCODONE HCL 5 MG PO TABS
5.0000 mg | ORAL_TABLET | ORAL | Status: DC | PRN
  Administered 2021-11-09 – 2021-11-10 (×6): 5 mg via ORAL
  Filled 2021-11-08 (×6): qty 1

## 2021-11-08 MED ORDER — ACETAMINOPHEN 650 MG RE SUPP: 650.0000 mg | Freq: Four times a day (QID) | RECTAL | Status: DC | PRN

## 2021-11-08 MED ORDER — BUDESON-GLYCOPYRROL-FORMOTEROL 160-9-4.8 MCG/ACT IN AERO
2.0000 | INHALATION_SPRAY | Freq: Two times a day (BID) | RESPIRATORY_TRACT | Status: DC
Start: 2021-11-09 — End: 2021-11-09

## 2021-11-08 MED ORDER — METOPROLOL SUCCINATE ER 25 MG PO TB24
25.0000 mg | ORAL_TABLET | Freq: Every day | ORAL | Status: DC
  Administered 2021-11-09 – 2021-11-10 (×2): 25 mg via ORAL
  Filled 2021-11-08 (×2): qty 1

## 2021-11-08 MED ORDER — PANTOPRAZOLE SODIUM 40 MG PO TBEC
40.0000 mg | DELAYED_RELEASE_TABLET | Freq: Two times a day (BID) | ORAL | Status: DC
  Administered 2021-11-09 – 2021-11-10 (×3): 40 mg via ORAL
  Filled 2021-11-08 (×3): qty 1

## 2021-11-08 MED ORDER — SERTRALINE HCL 50 MG PO TABS
100.0000 mg | ORAL_TABLET | Freq: Every day | ORAL | Status: DC
  Administered 2021-11-09 – 2021-11-10 (×2): 100 mg via ORAL
  Filled 2021-11-08 (×2): qty 2

## 2021-11-08 MED ORDER — ASPIRIN 81 MG PO TBEC
81.0000 mg | DELAYED_RELEASE_TABLET | Freq: Every day | ORAL | Status: DC
  Administered 2021-11-09 – 2021-11-10 (×2): 81 mg via ORAL
  Filled 2021-11-08 (×2): qty 1

## 2021-11-08 MED ORDER — DIAZEPAM 5 MG PO TABS
10.0000 mg | ORAL_TABLET | Freq: Every day | ORAL | Status: DC | PRN
  Administered 2021-11-09: 10 mg via ORAL
  Filled 2021-11-08: qty 2

## 2021-11-08 MED ORDER — POTASSIUM CHLORIDE 10 MEQ/100ML IV SOLN
10.0000 meq | INTRAVENOUS | Status: AC
  Administered 2021-11-08 – 2021-11-09 (×6): 10 meq via INTRAVENOUS
  Filled 2021-11-08 (×6): qty 100

## 2021-11-08 MED ORDER — POTASSIUM CHLORIDE CRYS ER 20 MEQ PO TBCR
40.0000 meq | EXTENDED_RELEASE_TABLET | Freq: Two times a day (BID) | ORAL | Status: DC
  Administered 2021-11-09 – 2021-11-10 (×3): 40 meq via ORAL
  Filled 2021-11-08 (×3): qty 2

## 2021-11-08 MED ORDER — SODIUM CHLORIDE 0.9 % IV SOLN
100.0000 mL/h | INTRAVENOUS | Status: DC
Start: 2021-11-08 — End: 2021-11-10
  Administered 2021-11-08 – 2021-11-10 (×4): 100 mL/h via INTRAVENOUS

## 2021-11-08 MED ORDER — PROPAFENONE HCL 150 MG PO TABS
300.0000 mg | ORAL_TABLET | Freq: Three times a day (TID) | ORAL | Status: DC
  Administered 2021-11-09 – 2021-11-10 (×4): 300 mg via ORAL
  Filled 2021-11-08 (×10): qty 2

## 2021-11-08 MED ORDER — ATORVASTATIN CALCIUM 10 MG PO TABS
10.0000 mg | ORAL_TABLET | Freq: Every day | ORAL | Status: DC
  Administered 2021-11-09: 10 mg via ORAL
  Filled 2021-11-08 (×2): qty 1

## 2021-11-08 MED ORDER — SODIUM CHLORIDE 0.9 % IV BOLUS
500.0000 mL | Freq: Once | INTRAVENOUS | Status: AC
  Administered 2021-11-08: 500 mL via INTRAVENOUS

## 2021-11-08 MED ORDER — POTASSIUM CHLORIDE CRYS ER 20 MEQ PO TBCR
60.0000 meq | EXTENDED_RELEASE_TABLET | Freq: Once | ORAL | Status: AC
  Administered 2021-11-08: 60 meq via ORAL
  Filled 2021-11-08: qty 3

## 2021-11-08 MED ORDER — WARFARIN SODIUM 2 MG PO TABS: 3.0000 mg | ORAL_TABLET | Freq: Every day | ORAL | Status: DC

## 2021-11-08 MED ORDER — ONDANSETRON HCL 4 MG PO TABS: 4.0000 mg | ORAL_TABLET | Freq: Four times a day (QID) | ORAL | Status: DC | PRN

## 2021-11-08 MED ORDER — ONDANSETRON HCL 4 MG/2ML IJ SOLN: 4.0000 mg | Freq: Four times a day (QID) | INTRAMUSCULAR | Status: DC | PRN

## 2021-11-08 MED ORDER — LORAZEPAM 2 MG/ML IJ SOLN
1.0000 mg | Freq: Once | INTRAMUSCULAR | Status: AC
  Administered 2021-11-08: 1 mg via INTRAVENOUS
  Filled 2021-11-08: qty 1

## 2021-11-08 MED ORDER — MORPHINE SULFATE (PF) 2 MG/ML IV SOLN: 2.0000 mg | INTRAVENOUS | Status: DC | PRN

## 2021-11-08 MED ORDER — ACETAMINOPHEN 325 MG PO TABS: 650.0000 mg | ORAL_TABLET | Freq: Four times a day (QID) | ORAL | Status: DC | PRN

## 2021-11-08 NOTE — ED Notes (Signed)
Put pt in gown and on cardiac monitor

## 2021-11-08 NOTE — ED Notes (Signed)
Pt states she is feeling anxious and is asking for ativan. Edp notified and ativan given.

## 2021-11-08 NOTE — ED Notes (Signed)
Aware of urine sample need

## 2021-11-08 NOTE — ED Notes (Signed)
Reminded pt we needed urine

## 2021-11-08 NOTE — ED Triage Notes (Signed)
Pt with numbness in both elbow down to hands at 1330, also bilateral knees down. Hx of vasculitis to lower legs.

## 2021-11-08 NOTE — ED Notes (Signed)
Pt back from radiology 

## 2021-11-08 NOTE — ED Notes (Signed)
Ambulatory to restroom

## 2021-11-08 NOTE — ED Notes (Signed)
Per EDP, pt is allow to eat. SO went to go get food for pt.

## 2021-11-08 NOTE — ED Provider Notes (Signed)
[facility] Provider Note   CSN: [id number] Arrival date & time: 11/08/21  1701     History  Chief Complaint  Patient presents with   Numbness    Mallory Sutton is a 61 y.o. female.  HPI Patient presents with her husband who assists with the history.  She has multiple medical issues, and I have seen and evaluated patient previously, admitting her to the hospital 3 months ago for hypoxia.  She presents today with concern for numbness and tingling in her arms bilaterally, distally, and facial numbness that has resolved.  She notes that she was well at about 4 days ago.  At that point she had bilateral arthrocentesis of her knees, with steroid injection.  Possibly before that, but around that time she developed numbness in her hands, but not until today did the numbness become profound, bilateral, roughly symmetric, with pain fullness as well as numbness in both extremities.  No speech difficulty during the exacerbation, no confusion, no syncope, no fever, no dyspnea.    Home Medications Prior to Admission medications   Medication Sig Start Date End Date Taking? Authorizing Provider  albuterol (VENTOLIN HFA) 108 (90 Base) MCG/ACT inhaler Inhale 2 puffs into the lungs every 6 (six) hours as needed for wheezing or shortness of breath. 10/06/21   Freddi Starr, MD  aspirin EC 81 MG tablet Take 81 mg by mouth daily.    [provider]  atorvastatin (LIPITOR) 10 MG tablet Take 10 mg by mouth daily. 07/24/21   [provider]  benzonatate (TESSALON PERLES) 100 MG capsule Take 1 capsule (100 mg total) by mouth 3 (three) times daily as needed for cough. 01/27/21   Josue Hector, MD  Budeson-Glycopyrrol-Formoterol (BREZTRI AEROSPHERE) 160-9-4.8 MCG/ACT AERO Inhale 2 puffs into the lungs in the morning and at bedtime. 10/06/21   Freddi Starr, MD  chlorpheniramine-HYDROcodone 10-8 MG/5ML Take 5 mLs by mouth every 12 (twelve) hours as needed for cough. 07/21/21    Barton Dubois, MD  dextromethorphan (DELSYM) 30 MG/5ML liquid Take 5 mLs (30 mg total) by mouth every 12 (twelve) hours as needed for cough. 07/21/21   Barton Dubois, MD  diazepam (VALIUM) 10 MG tablet Take 10 mg by mouth daily as needed. 07/04/21   [provider]  furosemide (LASIX) 40 MG tablet Take 80 mg by mouth daily.    [provider]  gabapentin (NEURONTIN) 300 MG capsule Take 300 mg by mouth See admin instructions. Take 300 mg in the morning, 300 mg in the afternoon   600 mg at bedtime    [provider]  HYDROcodone-acetaminophen (NORCO/VICODIN) 5-325 MG tablet Take 2 tablets by mouth 2 (two) times daily. 10/31/20   [provider]  metoprolol succinate (TOPROL-XL) 25 MG 24 hr tablet TAKE 1 TABLET BY MOUTH ONCE DAILY. 04/29/21   Josue Hector, MD  Multiple Vitamin (MULTI-VITAMINS) TABS Take 1 tablet by mouth daily.     [provider]  ondansetron (ZOFRAN ODT) 4 MG disintegrating tablet Take 1 tablet (4 mg total) by mouth every 8 (eight) hours as needed for nausea or vomiting. 01/14/21   Lamptey, Myrene Galas, MD  pantoprazole (PROTONIX) 40 MG tablet Take 1 tablet (40 mg total) by mouth 2 (two) times daily before a meal. 07/21/21   Barton Dubois, MD  potassium chloride SA (KLOR-CON) 20 MEQ tablet TAKE (1) TABLET BY MOUTH ONCE DAILY. 12/03/20   Josue Hector, MD  propafenone (RYTHMOL) 300 MG tablet  TAKE 1 TABLET BY MOUTH THREE TIMES A DAY. 03/31/21   Josue Hector, MD  sertraline (ZOLOFT) 100 MG tablet Take 100 mg by mouth daily. 06/27/21   [provider]  Turmeric 500 MG CAPS Take 500 mg by mouth 3 (three) times a week.    [provider]  warfarin (COUMADIN) 1 MG tablet TAKE 3 TABLETS DAILY, OR AS DIRECTED. 08/05/21   Josue Hector, MD      Allergies    Amiodarone and Penicillins    Review of Systems   Review of Systems  All other systems reviewed and are negative.   Physical Exam Updated Vital Signs BP (!) 141/92    Pulse 71   Temp 98.6 F (37 C) (Oral)   Resp 15   Ht '5\' 4"'$  (1.626 m)   Wt 41.7 kg   LMP 07/16/2013   SpO2 95%   BMI 15.79 kg/m  Physical Exam Vitals and nursing note reviewed.  Constitutional:      General: She is not in acute distress.    Appearance: She is well-developed.  HENT:     Head: Normocephalic and atraumatic.  Eyes:     Conjunctiva/sclera: Conjunctivae normal.  Cardiovascular:     Rate and Rhythm: Normal rate and regular rhythm.  Pulmonary:     Effort: Pulmonary effort is normal. No respiratory distress.     Breath sounds: Normal breath sounds. No stridor.  Abdominal:     General: There is no distension.  Skin:    General: Skin is warm and dry.     Comments: Vasculitis stigmata both legs no changes according the patient.  Neurological:     Mental Status: She is alert and oriented to person, place, and time.     Cranial Nerves: No cranial nerve deficit.     Motor: No weakness, tremor, atrophy or abnormal muscle tone.     Coordination: Coordination normal.  Psychiatric:        Mood and Affect: Mood normal.     ED Results / Procedures / Treatments   Labs (all labs ordered are listed, but only abnormal results are displayed) Labs Reviewed  PROTIME-INR - Abnormal; Notable for the following components:      Result Value   Prothrombin Time 23.0 (*)    INR 2.1 (*)    All other components within normal limits  CBC - Abnormal; Notable for the following components:   WBC 13.2 (*)    All other components within normal limits  DIFFERENTIAL - Abnormal; Notable for the following components:   Lymphs Abs 4.1 (*)    Monocytes Absolute 1.4 (*)    Abs Immature Granulocytes 0.10 (*)    All other components within normal limits  COMPREHENSIVE METABOLIC PANEL - Abnormal; Notable for the following components:   Potassium 2.3 (*)    Calcium 8.8 (*)    AST 42 (*)    All other components within normal limits  RAPID URINE DRUG SCREEN, HOSP PERFORMED - Abnormal; Notable  for the following components:   Opiates POSITIVE (*)    Benzodiazepines POSITIVE (*)    Tetrahydrocannabinol POSITIVE (*)    All other components within normal limits  URINALYSIS, ROUTINE W REFLEX MICROSCOPIC - Abnormal; Notable for the following components:   APPearance HAZY (*)    All other components within normal limits  I-STAT CHEM 8, ED - Abnormal; Notable for the following components:   Potassium 2.3 (*)    Calcium, Ion 0.96 (*)  All other components within normal limits  RESP PANEL BY RT-PCR (FLU A&B, COVID) ARPGX2  APTT  ETHANOL  MAGNESIUM    EKG EKG Interpretation  Date/Time:  Saturday November 08 2021 18:38:19 EDT Ventricular Rate:  78 PR Interval:  197 QRS Duration: 92 QT Interval:  414 QTC Calculation: 472 R Axis:   -84 Text Interpretation: Sinus rhythm Abnormal R-wave progression, early transition Probable LVH with secondary repol abnrm Inferior infarct, age indeterminate Baseline wander Abnormal ECG Confirmed by Carmin Muskrat [phone]) on 11/08/2021 6:44:37 PM  Radiology DG Chest 2 View  Result Date: 11/08/2021 CLINICAL DATA:  Weakness. EXAM: CHEST - 2 VIEW COMPARISON:  Chest x-ray 07/16/2021 FINDINGS: Patient is status post valve replacement, unchanged. Sternotomy wires are again noted. The cardiomediastinal silhouette is stable and within normal limits. The lungs are clear. There is no pleural effusion or pneumothorax. No acute fractures are seen. IMPRESSION: No active cardiopulmonary disease. Electronically Signed   By: Ronney Asters M.D.   On: 11/08/2021 18:51   CT HEAD WO CONTRAST  Result Date: 11/08/2021 CLINICAL DATA:  Stroke suspected. EXAM: CT HEAD WITHOUT CONTRAST TECHNIQUE: Contiguous axial images were obtained from the base of the skull through the vertex without intravenous contrast. RADIATION DOSE REDUCTION: This exam was performed according to the departmental dose-optimization program which includes automated exposure control, adjustment of the mA and/or  kV according to patient size and/or use of iterative reconstruction technique. COMPARISON:  None Available. FINDINGS: Brain: No evidence of acute infarction, hemorrhage, hydrocephalus, extra-axial collection or mass lesion/mass effect. Mild brain parenchymal volume loss. Vascular: No hyperdense vessel or unexpected calcification. Skull: Normal. Negative for fracture or focal lesion. Sinuses/Orbits: Mild sphenoid sinus disease. Other: None. IMPRESSION: 1. No acute intracranial abnormality. 2. Mild brain parenchymal volume loss. 3. Mild sphenoid sinus disease. Electronically Signed   By: Fidela Salisbury M.D.   On: 11/08/2021 18:36    Procedures Procedures    Medications Ordered in ED Medications  sodium chloride 0.9 % bolus 500 mL (0 mLs Intravenous Stopped 11/08/21 2022)    Followed by  0.9 %  sodium chloride infusion (0 mL/hr Intravenous Hold 11/08/21 1947)  potassium chloride 10 mEq in 100 mL IVPB (has no administration in time range)  potassium chloride SA (KLOR-CON M) CR tablet 60 mEq (60 mEq Oral Given 11/08/21 1901)  0.9 % NaCl with KCl 20 mEq/ L  infusion (0 mL/hr Intravenous Stopped 11/08/21 2131)  LORazepam (ATIVAN) injection 1 mg (1 mg Intravenous Given 11/08/21 2022)    ED Course/ Medical Decision Making/ A&P This patient with a Hx of vasculitis, cardiac disease, on Coumadin presents to the ED for concern of numbness bilaterally facial numbness transiently, this involves an extensive number of treatment options, and is a complaint that carries with it a high risk of complications and morbidity.    The differential diagnosis includes TIA, electrolyte abnormalities, steroid induced neuropathy, hyperglycemia, stroke   Social Determinants of Health:  Multiple comorbidities  Additional history obtained:  Additional history and/or information obtained from husband at bedside and chart review, notable for HPI from the former, chart review from the latter notable for discharge with ongoing  oxygen therapy as needed, roughly 24/7 at home after admission for hypoxia.   After the initial evaluation, orders, including: CT x-ray labs monitoring were initiated.   Patient placed on Cardiac and Pulse-Oximetry Monitors. The patient was maintained on a cardiac monitor.  The cardiac monitored showed an rhythm of 80 sinus normal The patient was also maintained on  pulse oximetry. The readings were typically 100% room air normal   On repeat evaluation of the patient improved Patient's muscle weakness has improved Lab Tests:  I personally interpreted labs.  The pertinent results include: Critically abnormal potassium value, 2.3  Imaging Studies ordered:  I independently visualized and interpreted imaging which showed no acute intracranial abnormality, no x-ray abnormalities of the chest I agree with the radiologist interpretation   Dispostion / Final MDM:  After consideration of the diagnostic results and the patient's response to treatment, this patient with a history of multiple medical issues including prior MI, on Coumadin presents with contractions, weakness in her extremities.  Patient is awake and alert, but with concern for numbness, weakness, broad differential including stroke, infection, electrolyte abnormalities considered.  Patient found to have critically abnormal potassium value, 2.3.  Initial resuscitation was with oral and IV potassium.  Patient does have a history of heart failure, COPD, but today's presentation is inconsistent with exacerbation of either these phenomenon.  And with her substantial abnormalities, ongoing weakness in her extremities, patient admitted for further monitoring, management  Final Clinical Impression(s) / ED Diagnoses Final diagnoses:  Numbness  Hypokalemia     Carmin Muskrat, MD 11/08/21 2202

## 2021-11-09 DIAGNOSIS — E876 Hypokalemia: Secondary | ICD-10-CM

## 2021-11-09 DIAGNOSIS — E43 Unspecified severe protein-calorie malnutrition: Secondary | ICD-10-CM

## 2021-11-09 DIAGNOSIS — D72829 Elevated white blood cell count, unspecified: Secondary | ICD-10-CM

## 2021-11-09 DIAGNOSIS — K219 Gastro-esophageal reflux disease without esophagitis: Secondary | ICD-10-CM

## 2021-11-09 LAB — MAGNESIUM: Magnesium: 2.1 mg/dL (ref 1.7–2.4)

## 2021-11-09 LAB — PROTIME-INR
INR: 2.2 — ABNORMAL HIGH (ref 0.8–1.2)
Prothrombin Time: 23.8 seconds — ABNORMAL HIGH (ref 11.4–15.2)

## 2021-11-09 LAB — COMPREHENSIVE METABOLIC PANEL
ALT: 20 U/L (ref 0–44)
AST: 31 U/L (ref 15–41)
Albumin: 3.1 g/dL — ABNORMAL LOW (ref 3.5–5.0)
Alkaline Phosphatase: 67 U/L (ref 38–126)
Anion gap: 4 — ABNORMAL LOW (ref 5–15)
BUN: 15 mg/dL (ref 6–20)
CO2: 26 mmol/L (ref 22–32)
Calcium: 7.8 mg/dL — ABNORMAL LOW (ref 8.9–10.3)
Chloride: 111 mmol/L (ref 98–111)
Creatinine, Ser: 0.67 mg/dL (ref 0.44–1.00)
GFR, Estimated: >60 mL/min (ref >60)
Glucose, Bld: 94 mg/dL (ref 70–99)
Potassium: 4.4 mmol/L (ref 3.5–5.1)
Sodium: 141 mmol/L (ref 135–145)
Total Bilirubin: 0.6 mg/dL (ref 0.3–1.2)
Total Protein: 5.7 g/dL — ABNORMAL LOW (ref 6.5–8.1)

## 2021-11-09 MED ORDER — UMECLIDINIUM BROMIDE 62.5 MCG/ACT IN AEPB
1.0000 | INHALATION_SPRAY | Freq: Every day | RESPIRATORY_TRACT | Status: DC
  Administered 2021-11-09 – 2021-11-10 (×2): 1 via RESPIRATORY_TRACT
  Filled 2021-11-09: qty 7

## 2021-11-09 MED ORDER — LORAZEPAM 2 MG/ML IJ SOLN
0.5000 mg | Freq: Once | INTRAMUSCULAR | Status: AC
  Administered 2021-11-09: 0.5 mg via INTRAVENOUS
  Filled 2021-11-09: qty 1

## 2021-11-09 MED ORDER — WARFARIN - PHARMACIST DOSING INPATIENT: Freq: Every day | Status: DC

## 2021-11-09 MED ORDER — LORAZEPAM 1 MG PO TABS
1.0000 mg | ORAL_TABLET | Freq: Two times a day (BID) | ORAL | Status: DC | PRN
  Administered 2021-11-09 – 2021-11-10 (×2): 1 mg via ORAL
  Filled 2021-11-09 (×2): qty 1

## 2021-11-09 MED ORDER — CALCIUM GLUCONATE-NACL 1-0.675 GM/50ML-% IV SOLN
1.0000 g | Freq: Once | INTRAVENOUS | Status: AC
  Administered 2021-11-09: 1000 mg via INTRAVENOUS
  Filled 2021-11-09: qty 50

## 2021-11-09 MED ORDER — MOMETASONE FURO-FORMOTEROL FUM 200-5 MCG/ACT IN AERO
2.0000 | INHALATION_SPRAY | Freq: Two times a day (BID) | RESPIRATORY_TRACT | Status: DC
  Administered 2021-11-09 – 2021-11-10 (×3): 2 via RESPIRATORY_TRACT
  Filled 2021-11-09: qty 8.8

## 2021-11-09 MED ORDER — GABAPENTIN 300 MG PO CAPS
300.0000 mg | ORAL_CAPSULE | Freq: Two times a day (BID) | ORAL | Status: DC
  Administered 2021-11-09 – 2021-11-10 (×3): 300 mg via ORAL
  Filled 2021-11-09 (×3): qty 1

## 2021-11-09 MED ORDER — WARFARIN SODIUM 2 MG PO TABS
2.0000 mg | ORAL_TABLET | Freq: Once | ORAL | Status: AC
  Administered 2021-11-09: 2 mg via ORAL
  Filled 2021-11-09: qty 1

## 2021-11-09 MED ORDER — ENSURE ENLIVE PO LIQD
237.0000 mL | Freq: Two times a day (BID) | ORAL | Status: DC
  Administered 2021-11-10: 237 mL via ORAL

## 2021-11-09 MED ORDER — WARFARIN SODIUM 1 MG PO TABS
1.0000 mg | ORAL_TABLET | Freq: Once | ORAL | Status: AC
  Administered 2021-11-09: 1 mg via ORAL
  Filled 2021-11-09: qty 1

## 2021-11-09 NOTE — ED Notes (Signed)
Date and time results received: 11/08/2021 1858  Test: K+ Critical Value: 2.3  Name of Provider Notified: Dr. Vanita Panda  Orders Received? Or Actions Taken?: See chart

## 2021-11-09 NOTE — Progress Notes (Signed)
Patient seen and examined; admitted after midnight secondary to severe hypokalemia, hypocalcemia and hands cramps/spasms and and face tingling.  No chest pain, no nausea, no vomiting.  Chronically ill in appearance and underweight.  Please refer to H&P written by Dr. Clearence Ped for further info/details on admission.  Plan: -Adjust Coumadin with assistance by pharmacy (patient with mechanical valve INR goal 2.5-3.5). -start Ensure twice a day -Replete calcium level -Follow electrolytes trend -Potassium currently repleted. -Maintain adequate hydration.  Barton Dubois MD [phone]

## 2021-11-09 NOTE — Assessment & Plan Note (Signed)
-  No signs or symptoms of infection -Patient just had bilateral intra-articular steroid injections in her knees. -Most likely stress demargination versus acute phase reactant. -Repeat CBC at follow-up visit to assess stability. -No antibiotics needed.

## 2021-11-09 NOTE — Progress Notes (Signed)
[facility] for Warfarin Indication: atrial fibrillation, AVR  Allergies  Allergen Reactions   Amiodarone Nausea Only   Penicillins Hives    Has patient had a PCN reaction causing immediate rash, facial/tongue/throat swelling, SOB or lightheadedness with hypotension: YES Has patient had a PCN reaction causing severe rash involving mucus membranes or skin necrosis: NO Has patient had a PCN reaction that required hospitalization NO Has patient had a PCN reaction occurring within the last 10 years: NO If all of the above answers are "NO", then may proceed with Cephalosporin use.     Patient Measurements: Height: '5\' 4"'$  (162.6 cm) Weight: 44.7 kg (98 lb 9.6 oz) IBW/kg (Calculated) : 54.7   Vital Signs: Temp: 97.9 F (36.6 C) (07/09 0504) Temp Source: Oral (07/09 0504) BP: 138/51 (07/09 0504) Pulse Rate: 87 (07/09 0504)  Labs: Recent Labs    11/06/21 1555 11/08/21 1813 11/08/21 1826  HGB  --  12.4 13.6  HCT  --  38.1 40.0  PLT  --  340  --   APTT  --  29  --   LABPROT  --  23.0*  --   INR 3.9* 2.1*  --   CREATININE  --  0.96 0.90     Estimated Creatinine Clearance: 47.5 mL/min (by C-G formula based on SCr of 0.9 mg/dL).   Medical History: Past Medical History:  Diagnosis Date   Aortic stenosis    ATRIAL ARRHYTHMIAS    Bicuspid aortic valve    CHF (congestive heart failure) (HCC)    COARCTATION OF AORTA    ENDOMETRIOSIS    Heart murmur    Paroxysmal atrial fibrillation (HCC)    Shortness of breath    THORACIC AORTIC ANEURYSM     Medications:  PTA Warfarin ('2mg'$  daily except '1mg'$  on Sundays per patient)   Assessment: 61 yr female admitted with c/o sudden numbness, coldness and contractures of her hands.  PMH significant for AFib, AVR (on warfarin), CHF  INR therapeutic 2.2  Goal of Therapy:  INR 2-3    Plan:  Warfarin 1 mg po x 1 tonight Daily PT/INR  Margot Ables, PharmD Clinical Pharmacist 11/09/2021 8:08  AM

## 2021-11-09 NOTE — Assessment & Plan Note (Signed)
No clinical evidence of acute exacerbation Continue aspirin, statin, beta-blocker Closely monitor volume status as we are holding the Lasix to replete the potassium

## 2021-11-09 NOTE — Assessment & Plan Note (Signed)
-  Status post mechanical valve -INR goal 2.5 (range of 2-3) -Currently stable -Continue close monitoring of patient INR with further adjustment to Coumadin dose as needed. -Given underlying poor nutrition has been recommended the use of feeding supplement twice a day on daily basis and if needed adjustment to Coumadin dose to allow outpatient compliance with feeding supplements is recommended.

## 2021-11-09 NOTE — Assessment & Plan Note (Signed)
Continue Protonix °

## 2021-11-09 NOTE — Assessment & Plan Note (Signed)
-   In the setting of holding diuresis therapy -No GI losses reported.  -Daily supplementation has been adjusted and patient started on every other day magnesium to achieve further stability. -Repeat basic metabolic panel follow-up visit to follow electrolytes trend and stability.   -Patient nutrition status also to be improved while consuming Ensure twice a day on daily basis.

## 2021-11-09 NOTE — Plan of Care (Signed)
  Problem: Health Behavior/Discharge Planning: Goal: Ability to manage health-related needs will improve Outcome: Progressing   

## 2021-11-09 NOTE — H&P (Signed)
History and Physical    Patient: Mallory Sutton QRF:[id number] DOB: 02-14-1961 DOA: 11/08/2021 DOS: the patient was seen and examined on 11/09/2021 PCP: Celene Squibb, MD  Patient coming from: Home  Chief Complaint:  Chief Complaint  Patient presents with   Numbness   HPI: Mallory Sutton is a 61 y.o. female with medical history significant of aortic stenosis status post mechanical valve replacement, atrial fibrillation, CHF, MR presents to the ED with a chief complaint of hand contractures.  Patient reports that at 1:30 PM on 8 July she had sudden onset of numbness, icy sensation, and contractures of her hands.  She reports it has never happened to her before.  She does sometimes experience numbness and paresthesias in her legs secondary to vasculitis, but this is never happened.  The affected area was from the elbows all the way down to the fingertips.  She reports associated left face numbness as well.  This sensation started moving down to her legs, but never got as bad as her hands.  Patient reports that the symptoms are improved but not completely resolved at the time of my exam.  She denies any nausea/vomiting/diarrhea.  She takes 80 mg of Lasix for peripheral edema per her report.  She has not had any change in her urine output in quantity, color, other characteristics.  Patient reports that she is trying to gain weight but cannot seem to break 100 pounds.  Patient has no other complaints at this time.  Patient does not smoke.  She drinks 2 light beers per night, she does not use illicit drugs.  She is vaccinated for COVID and she is full code. Review of Systems: As mentioned in the history of present illness. All other systems reviewed and are negative. Past Medical History:  Diagnosis Date   Aortic stenosis    ATRIAL ARRHYTHMIAS    Bicuspid aortic valve    CHF (congestive heart failure) (HCC)    COARCTATION OF AORTA    ENDOMETRIOSIS    Heart murmur    Paroxysmal atrial fibrillation (HCC)     Shortness of breath    THORACIC AORTIC ANEURYSM    Past Surgical History:  Procedure Laterality Date   CARDIOVERSION N/A 08/29/2019   Procedure: CARDIOVERSION;  Surgeon: Arnoldo Lenis, MD;  Location: [facility];  Service: Endoscopy;  Laterality: N/A;   coarctation repair and residual restenosis     KNEE SURGERY     TEE WITHOUT CARDIOVERSION N/A 07/12/2019   Procedure: TRANSESOPHAGEAL ECHOCARDIOGRAM (TEE);  Surgeon: Sueanne Margarita, MD;  Location: [facility] ENDOSCOPY;  Service: Cardiovascular;  Laterality: N/A;   TEE WITHOUT CARDIOVERSION N/A 08/29/2019   Procedure: TRANSESOPHAGEAL ECHOCARDIOGRAM (TEE) WITH PROPOFOL;  Surgeon: Arnoldo Lenis, MD;  Location: [facility];  Service: Endoscopy;  Laterality: N/A;   Social History:  reports that she has never smoked. She has been exposed to tobacco smoke. She has never used smokeless tobacco. She reports current alcohol use. She reports that she does not use drugs.  Allergies  Allergen Reactions   Amiodarone Nausea Only   Penicillins Hives    Has patient had a PCN reaction causing immediate rash, facial/tongue/throat swelling, SOB or lightheadedness with hypotension: YES Has patient had a PCN reaction causing severe rash involving mucus membranes or skin necrosis: NO Has patient had a PCN reaction that required hospitalization NO Has patient had a PCN reaction occurring within the last 10 years: NO If all of the above answers are "NO", then may proceed with  Cephalosporin use.     Family History  Problem Relation Age of Onset   Suicidality Mother    Mental illness Mother    Drug abuse Brother    Healthy Daughter    Healthy Son     Prior to Admission medications   Medication Sig Start Date End Date Taking? Authorizing Provider  albuterol (VENTOLIN HFA) 108 (90 Base) MCG/ACT inhaler Inhale 2 puffs into the lungs every 6 (six) hours as needed for wheezing or shortness of breath. 10/06/21   Freddi Starr, MD  aspirin EC 81 MG tablet Take 81  mg by mouth daily.    [provider]  atorvastatin (LIPITOR) 10 MG tablet Take 10 mg by mouth daily. 07/24/21   [provider]  benzonatate (TESSALON PERLES) 100 MG capsule Take 1 capsule (100 mg total) by mouth 3 (three) times daily as needed for cough. 01/27/21   Josue Hector, MD  Budeson-Glycopyrrol-Formoterol (BREZTRI AEROSPHERE) 160-9-4.8 MCG/ACT AERO Inhale 2 puffs into the lungs in the morning and at bedtime. 10/06/21   Freddi Starr, MD  chlorpheniramine-HYDROcodone 10-8 MG/5ML Take 5 mLs by mouth every 12 (twelve) hours as needed for cough. 07/21/21   Barton Dubois, MD  dextromethorphan (DELSYM) 30 MG/5ML liquid Take 5 mLs (30 mg total) by mouth every 12 (twelve) hours as needed for cough. 07/21/21   Barton Dubois, MD  diazepam (VALIUM) 10 MG tablet Take 10 mg by mouth daily as needed. 07/04/21   [provider]  furosemide (LASIX) 40 MG tablet Take 80 mg by mouth daily.    [provider]  gabapentin (NEURONTIN) 300 MG capsule Take 300 mg by mouth See admin instructions. Take 300 mg in the morning, 300 mg in the afternoon   600 mg at bedtime    [provider]  HYDROcodone-acetaminophen (NORCO/VICODIN) 5-325 MG tablet Take 2 tablets by mouth 2 (two) times daily. 10/31/20   [provider]  metoprolol succinate (TOPROL-XL) 25 MG 24 hr tablet TAKE 1 TABLET BY MOUTH ONCE DAILY. 04/29/21   Josue Hector, MD  Multiple Vitamin (MULTI-VITAMINS) TABS Take 1 tablet by mouth daily.     [provider]  ondansetron (ZOFRAN ODT) 4 MG disintegrating tablet Take 1 tablet (4 mg total) by mouth every 8 (eight) hours as needed for nausea or vomiting. 01/14/21   Lamptey, Myrene Galas, MD  pantoprazole (PROTONIX) 40 MG tablet Take 1 tablet (40 mg total) by mouth 2 (two) times daily before a meal. 07/21/21   Barton Dubois, MD  potassium chloride SA (KLOR-CON) 20 MEQ tablet TAKE (1) TABLET BY MOUTH ONCE DAILY. 12/03/20   Josue Hector, MD   propafenone (RYTHMOL) 300 MG tablet TAKE 1 TABLET BY MOUTH THREE TIMES A DAY. 03/31/21   Josue Hector, MD  sertraline (ZOLOFT) 100 MG tablet Take 100 mg by mouth daily. 06/27/21   [provider]  Turmeric 500 MG CAPS Take 500 mg by mouth 3 (three) times a week.    [provider]  warfarin (COUMADIN) 1 MG tablet TAKE 3 TABLETS DAILY, OR AS DIRECTED. 08/05/21   Josue Hector, MD    Physical Exam: Vitals:   11/08/21 2000 11/08/21 2100 11/08/21 2300 11/08/21 2328  BP: (!) 146/71 (!) 141/92  (!) 141/60  Pulse: 67 71  81  Resp: 19 15    Temp:    98.1 F (36.7 C)  TempSrc:    Oral  SpO2: 100% 95%  100%  Weight:  44.7 kg   Height:   '5\' 4"'$  (1.626 m)    1.  General: Patient lying supine in bed,  no acute distress   2. Psychiatric: Alert and oriented x 3, mood and behavior normal for situation, pleasant and cooperative with exam   3. Neurologic: Speech and language are normal, face is symmetric, moves all 4 extremities voluntarily, at baseline without acute deficits on limited exam   4. HEENMT:  Head is atraumatic, normocephalic, pupils reactive to light, neck is supple, trachea is midline, mucous membranes are moist   5. Respiratory : Lungs are clear to auscultation bilaterally without wheezing, rhonchi, rales, no cyanosis, no increase in work of breathing or accessory muscle use   6. Cardiovascular : Heart rate normal, rhythm is regular, no murmurs, rubs or gallops, no peripheral edema, peripheral pulses palpated   7. Gastrointestinal:  Abdomen is soft, nondistended, nontender to palpation bowel sounds active, no masses or organomegaly palpated   8. Skin:  Skin is warm, dry and intact without rashes, acute lesions, or ulcers on limited exam   9.Musculoskeletal:  No acute deformities or trauma, no asymmetry in tone, no peripheral edema, peripheral pulses palpated, no tenderness to palpation in the extremities  Data Reviewed: In the ED Temp 98.6, heart  rate 53-86, respiratory rate 15-20, blood pressure 141/92, satting at 95% Leukocytosis 13.2, hemoglobin 12.4, platelets 340 Chemistry reveals a potassium of 2.3 and a slightly elevated AST at 42 INR is at goal 2.1 UA is not indicative of UTI UDS shows benzos, opiates, THC CT head shows no acute findings Chest x-ray shows no active cardiopulmonary disease EKG shows a heart rate of 78, QTc 472, sinus rhythm 60 mEq potassium IV, 60 mEq potassium p.o., 500 mL bolus of normal saline  Normal saline then continued at 100 mL/h Admission requested for management of potassium   Assessment and Plan: * Hypokalemia - Hold home Lasix -Patient denies nausea/vomiting/diarrhea -She does report poor p.o. intake -Potassium today 2.3 -120 mEq replaced in the ED -Trend in the a.m. with mag -Continue home potassium supplementation  Anxiety and depression - Valium and Zoloft  GERD (gastroesophageal reflux disease) - Continue Protonix  Atrial fibrillation with RVR (HCC) - Continue Rythmol and warfarin -Continue to monitor  Aortic valve disorder - Status post mechanical valve -Loud systolic murmur -INR goal 2-3 -INR currently 2.1 -Continue warfarin -Continue to trend INR      Advance Care Planning:   Code Status: Full Code   Consults: None  Family Communication: Husband at bedside  Severity of Illness: The appropriate patient status for this patient is OBSERVATION. Observation status is judged to be reasonable and necessary in order to provide the required intensity of service to ensure the patient's safety. The patient's presenting symptoms, physical exam findings, and initial radiographic and laboratory data in the context of their medical condition is felt to place them at decreased risk for further clinical deterioration. Furthermore, it is anticipated that the patient will be medically stable for discharge from the hospital within 2 midnights of admission.   Author: Rolla Plate, DO 11/09/2021 12:13 AM  For on call review [email or web address].

## 2021-11-09 NOTE — Assessment & Plan Note (Signed)
-  Continue Rythmol and beta-blocker -Continue Coumadin for secondary prevention -Continue patient follow-up with cardiology service.

## 2021-11-09 NOTE — Progress Notes (Addendum)
ANTICOAGULATION CONSULT NOTE - Initial Consult  Pharmacy Consult for Warfarin Indication: atrial fibrillation, AVR  Allergies  Allergen Reactions   Amiodarone Nausea Only   Penicillins Hives    Has patient had a PCN reaction causing immediate rash, facial/tongue/throat swelling, SOB or lightheadedness with hypotension: YES Has patient had a PCN reaction causing severe rash involving mucus membranes or skin necrosis: NO Has patient had a PCN reaction that required hospitalization NO Has patient had a PCN reaction occurring within the last 10 years: NO If all of the above answers are "NO", then may proceed with Cephalosporin use.     Patient Measurements: Height: '5\' 4"'$  (162.6 cm) Weight: 44.7 kg (98 lb 9.6 oz) IBW/kg (Calculated) : 54.7   Vital Signs: Temp: 98.1 F (36.7 C) (07/08 2328) Temp Source: Oral (07/08 2328) BP: 141/60 (07/08 2328) Pulse Rate: 81 (07/08 2328)  Labs: Recent Labs    11/06/21 1555 11/08/21 1813 11/08/21 1826  HGB  --  12.4 13.6  HCT  --  38.1 40.0  PLT  --  340  --   APTT  --  29  --   LABPROT  --  23.0*  --   INR 3.9* 2.1*  --   CREATININE  --  0.96 0.90    Estimated Creatinine Clearance: 47.5 mL/min (by C-G formula based on SCr of 0.9 mg/dL).   Medical History: Past Medical History:  Diagnosis Date   Aortic stenosis    ATRIAL ARRHYTHMIAS    Bicuspid aortic valve    CHF (congestive heart failure) (HCC)    COARCTATION OF AORTA    ENDOMETRIOSIS    Heart murmur    Paroxysmal atrial fibrillation (HCC)    Shortness of breath    THORACIC AORTIC ANEURYSM     Medications:  PTA Warfarin ('2mg'$  daily except '1mg'$  on Sundays per patient) - LD taken on 7/7 per patient  Assessment: 61 yr female admitted with c/o sudden numbness, coldness and contractures of her hands.  PMH significant for AFib, AVR (on warfarin), CHF  INR therapeutic (2.1) on admission  Goal of Therapy:  INR 2-3    Plan:  Warfarin '2mg'$  po x 1 tonight Daily  PT/INR  Everette Rank, PharmD 11/09/2021,12:23 AM

## 2021-11-09 NOTE — Assessment & Plan Note (Signed)
-  Continue the use of Valium and Zoloft -Overall stable mood appreciated.

## 2021-11-10 DIAGNOSIS — F32A Depression, unspecified: Secondary | ICD-10-CM

## 2021-11-10 DIAGNOSIS — F419 Anxiety disorder, unspecified: Secondary | ICD-10-CM

## 2021-11-10 DIAGNOSIS — I4891 Unspecified atrial fibrillation: Secondary | ICD-10-CM | POA: Diagnosis not present

## 2021-11-10 DIAGNOSIS — E876 Hypokalemia: Secondary | ICD-10-CM | POA: Diagnosis not present

## 2021-11-10 DIAGNOSIS — R200 Anesthesia of skin: Secondary | ICD-10-CM | POA: Diagnosis not present

## 2021-11-10 DIAGNOSIS — K219 Gastro-esophageal reflux disease without esophagitis: Secondary | ICD-10-CM

## 2021-11-10 DIAGNOSIS — I5032 Chronic diastolic (congestive) heart failure: Secondary | ICD-10-CM

## 2021-11-10 LAB — BASIC METABOLIC PANEL
Anion gap: 0 — ABNORMAL LOW (ref 5–15)
BUN: 13 mg/dL (ref 6–20)
CO2: 25 mmol/L (ref 22–32)
Calcium: 8.2 mg/dL — ABNORMAL LOW (ref 8.9–10.3)
Chloride: 116 mmol/L — ABNORMAL HIGH (ref 98–111)
Creatinine, Ser: 0.63 mg/dL (ref 0.44–1.00)
GFR, Estimated: >60 mL/min (ref >60)
Glucose, Bld: 91 mg/dL (ref 70–99)
Potassium: 4.8 mmol/L (ref 3.5–5.1)
Sodium: 141 mmol/L (ref 135–145)

## 2021-11-10 LAB — CBC WITH DIFFERENTIAL/PLATELET
Abs Immature Granulocytes: 0.04 10*3/uL (ref 0.00–0.07)
Basophils Absolute: 0.1 10*3/uL (ref 0.0–0.1)
Basophils Relative: 1 %
Eosinophils Absolute: 0.2 10*3/uL (ref 0.0–0.5)
Eosinophils Relative: 2 %
HCT: 30.8 % — ABNORMAL LOW (ref 36.0–46.0)
Hemoglobin: 9.6 g/dL — ABNORMAL LOW (ref 12.0–15.0)
Immature Granulocytes: 1 %
Lymphocytes Relative: 37 %
Lymphs Abs: 3.1 10*3/uL (ref 0.7–4.0)
MCH: 27.7 pg (ref 26.0–34.0)
MCHC: 31.2 g/dL (ref 30.0–36.0)
MCV: 88.8 fL (ref 80.0–100.0)
Monocytes Absolute: 0.9 10*3/uL (ref 0.1–1.0)
Monocytes Relative: 11 %
Neutro Abs: 4.1 10*3/uL (ref 1.7–7.7)
Neutrophils Relative %: 48 %
Platelets: 243 10*3/uL (ref 150–400)
RBC: 3.47 MIL/uL — ABNORMAL LOW (ref 3.87–5.11)
RDW: 13.6 % (ref 11.5–15.5)
WBC: 8.3 10*3/uL (ref 4.0–10.5)
nRBC: 0 % (ref 0.0–0.2)

## 2021-11-10 LAB — PROTIME-INR
INR: 2.4 — ABNORMAL HIGH (ref 0.8–1.2)
Prothrombin Time: 26 seconds — ABNORMAL HIGH (ref 11.4–15.2)

## 2021-11-10 MED ORDER — WARFARIN SODIUM 2 MG PO TABS
2.0000 mg | ORAL_TABLET | Freq: Once | ORAL | Status: DC
Start: 2021-11-10 — End: 2021-11-10

## 2021-11-10 MED ORDER — ENSURE ENLIVE PO LIQD: 237.0000 mL | Freq: Two times a day (BID) | ORAL | Status: DC

## 2021-11-10 MED ORDER — MAGNESIUM OXIDE -MG SUPPLEMENT 400 (240 MG) MG PO TABS: 400.0000 mg | ORAL_TABLET | ORAL | 2 refills | Status: DC

## 2021-11-10 MED ORDER — OYSTER SHELL CALCIUM/D3 500-5 MG-MCG PO TABS: 1.0000 | ORAL_TABLET | Freq: Every day | ORAL | 2 refills | Status: DC

## 2021-11-10 MED ORDER — WARFARIN SODIUM 1 MG PO TABS: 2.0000 mg | ORAL_TABLET | Freq: Every day | ORAL | 3 refills | Status: DC

## 2021-11-10 MED ORDER — POTASSIUM CHLORIDE CRYS ER 20 MEQ PO TBCR: 40.0000 meq | EXTENDED_RELEASE_TABLET | Freq: Every day | ORAL | 2 refills | Status: DC

## 2021-11-10 NOTE — Assessment & Plan Note (Signed)
-   Calcium gluconate provided -Daily Os-Cal has been initiated -Continue to follow electrolytes trend.

## 2021-11-10 NOTE — Discharge Summary (Signed)
Physician Discharge Summary   Patient: Mallory Sutton MRN: [id number] DOB: 09/12/1960  Admit date:     11/08/2021  Discharge date: 11/10/21  Discharge Physician: Barton Dubois   PCP: Celene Squibb, MD   Recommendations at discharge:  Repeat PT/INR to assure stability now that the patient will be consuming Ensure twice a day and very likely increasing her baseline vitamin K consumption. (Goal is for an INR of 2.5 with a range of 2-3). Repeat basic metabolic panel and magnesium level to follow electrolytes and renal function.   Discharge Diagnoses: Principal Problem:   Hypokalemia Active Problems:   Chronic diastolic CHF (congestive heart failure) (HCC)   Anxiety and depression   Aortic valve disorder   Atrial fibrillation with RVR (HCC)   Protein-calorie malnutrition, severe (HCC)   GERD (gastroesophageal reflux disease)   Leukocytosis   Numbness   Hypocalcemia   Brief Hospital admission course: As per H&P written by Dr. Clearence Ped on 11/09/21 Mallory Sutton is a 61 y.o. female with medical history significant of aortic stenosis status post mechanical valve replacement, atrial fibrillation, CHF, MR presents to the ED with a chief complaint of hand contractures.  Patient reports that at 1:30 PM on 8 July she had sudden onset of numbness, icy sensation, and contractures of her hands.  She reports it has never happened to her before.  She does sometimes experience numbness and paresthesias in her legs secondary to vasculitis, but this is never happened.  The affected area was from the elbows all the way down to the fingertips.  She reports associated left face numbness as well.  This sensation started moving down to her legs, but never got as bad as her hands.  Patient reports that the symptoms are improved but not completely resolved at the time of my exam.  She denies any nausea/vomiting/diarrhea.  She takes 80 mg of Lasix for peripheral edema per her report.  She has not had any change in her  urine output in quantity, color, other characteristics.  Patient reports that she is trying to gain weight but cannot seem to break 100 pounds.  Patient has no other complaints at this time.   Patient does not smoke.  She drinks 2 light beers per night, she does not use illicit drugs.  She is vaccinated for COVID and she is full code.  Assessment and Plan: * Hypokalemia - In the setting of holding diuresis therapy -No GI losses reported.  -Daily supplementation has been adjusted and patient started on every other day magnesium to achieve further stability. -Repeat basic metabolic panel follow-up visit to follow electrolytes trend and stability.   -Patient nutrition status also to be improved while consuming Ensure twice a day on daily basis.   Chronic diastolic CHF (congestive heart failure) (HCC) -Appears to be compensated -Continue the use of beta-blocker and home diuretics. -Continue to check weight on daily basis and follow heart healthy diet.   -Continue patient follow-up with cardiology service.    Anxiety and depression -Continue the use of Valium and Zoloft -Overall stable mood appreciated.  Hypocalcemia - Calcium gluconate provided -Daily Os-Cal has been initiated -Continue to follow electrolytes trend.  Numbness - In the setting of hypocalcemia most likely and electrolytes impairment. -Improved/resolved after electrolytes repletion provided -Continue close monitoring and plan for further supplementation as required.  Leukocytosis -No signs or symptoms of infection -Patient just had bilateral intra-articular steroid injections in her knees. -Most likely stress demargination versus acute phase reactant. -Repeat CBC  at follow-up visit to assess stability. -No antibiotics needed.   GERD (gastroesophageal reflux disease) -Continue Protonix  Protein-calorie malnutrition, severe (HCC) -Body mass index is 16.92 kg/m. -Ensure twice a day has been recommended a feeding  supplement. -Continue to follow weight and nutritional status.  Atrial fibrillation with RVR (HCC) -Continue Rythmol and beta-blocker -Continue Coumadin for secondary prevention -Continue patient follow-up with cardiology service.  Aortic valve disorder -Status post mechanical valve -INR goal 2.5 (range of 2-3) -Currently stable -Continue close monitoring of patient INR with further adjustment to Coumadin dose as needed. -Given underlying poor nutrition has been recommended the use of feeding supplement twice a day on daily basis and if needed adjustment to Coumadin dose to allow outpatient compliance with feeding supplements is recommended.    Consultants: None Procedures performed: See below for x-ray reports. Disposition: Home Diet recommendation: Heart healthy diet.  DISCHARGE MEDICATION: Allergies as of 11/10/2021       Reactions   Amiodarone Nausea Only   Penicillins Hives   Has patient had a PCN reaction causing immediate rash, facial/tongue/throat swelling, SOB or lightheadedness with hypotension: YES Has patient had a PCN reaction causing severe rash involving mucus membranes or skin necrosis: NO Has patient had a PCN reaction that required hospitalization NO Has patient had a PCN reaction occurring within the last 10 years: NO If all of the above answers are "NO", then may proceed with Cephalosporin use.        Medication List     TAKE these medications    albuterol 108 (90 Base) MCG/ACT inhaler Commonly known as: VENTOLIN HFA Inhale 2 puffs into the lungs every 6 (six) hours as needed for wheezing or shortness of breath.   aspirin EC 81 MG tablet Take 81 mg by mouth daily.   atorvastatin 10 MG tablet Commonly known as: LIPITOR Take 10 mg by mouth daily.   benzonatate 100 MG capsule Commonly known as: Tessalon Perles Take 1 capsule (100 mg total) by mouth 3 (three) times daily as needed for cough.   Breztri Aerosphere 160-9-4.8 MCG/ACT Aero Generic  drug: Budeson-Glycopyrrol-Formoterol Inhale 2 puffs into the lungs in the morning and at bedtime.   calcium-vitamin D 500-5 MG-MCG tablet Commonly known as: OSCAL WITH D Take 1 tablet by mouth daily.   chlorpheniramine-HYDROcodone 10-8 MG/5ML Take 5 mLs by mouth every 12 (twelve) hours as needed for cough.   dextromethorphan 30 MG/5ML liquid Commonly known as: Delsym Take 5 mLs (30 mg total) by mouth every 12 (twelve) hours as needed for cough.   diazepam 10 MG tablet Commonly known as: VALIUM Take 10 mg by mouth daily as needed for sleep.   feeding supplement Liqd Take 237 mLs by mouth 2 (two) times daily between meals.   furosemide 40 MG tablet Commonly known as: LASIX Take 80 mg by mouth 2 (two) times daily.   gabapentin 300 MG capsule Commonly known as: NEURONTIN Take 300-600 mg by mouth See admin instructions. Take 300 mg in the morning, 300 mg in the afternoon, and 600 mg at bedtime   HYDROcodone-acetaminophen 5-325 MG tablet Commonly known as: NORCO/VICODIN Take 2 tablets by mouth 2 (two) times daily.   magnesium oxide 400 (240 Mg) MG tablet Commonly known as: MagOx 400 Take 1 tablet (400 mg total) by mouth every other day.   metoprolol succinate 25 MG 24 hr tablet Commonly known as: TOPROL-XL TAKE 1 TABLET BY MOUTH ONCE DAILY.   Multi-Vitamins Tabs Take 1 tablet by mouth daily.  ondansetron 4 MG disintegrating tablet Commonly known as: Zofran ODT Take 1 tablet (4 mg total) by mouth every 8 (eight) hours as needed for nausea or vomiting.   pantoprazole 40 MG tablet Commonly known as: PROTONIX Take 1 tablet (40 mg total) by mouth 2 (two) times daily before a meal.   potassium chloride SA 20 MEQ tablet Commonly known as: KLOR-CON M Take 2 tablets (40 mEq total) by mouth daily. What changed: See the new instructions.   propafenone 300 MG tablet Commonly known as: RYTHMOL TAKE 1 TABLET BY MOUTH THREE TIMES A DAY. What changed: when to take this    sertraline 100 MG tablet Commonly known as: ZOLOFT Take 100 mg by mouth daily.   Turmeric 500 MG Caps Take 500 mg by mouth 3 (three) times a week.   warfarin 1 MG tablet Commonly known as: COUMADIN Take as directed. If you are unsure how to take this medication, talk to your nurse or doctor. Original instructions: Take 2 tablets (2 mg total) by mouth daily at 4 PM. What changed: See the new instructions.        Follow-up Information     Celene Squibb, MD. Schedule an appointment as soon as possible for a visit in 10 day(s).   Specialty: Internal Medicine Contact information: [address] [place]t [facility] [address] [phone]         Josue Hector, MD .   Specialty: Cardiology Contact information: [phone] [address] [place] [address] [phone]                Discharge Exam: Danley Danker Weights   11/08/21 1716 11/08/21 2300  Weight: 41.7 kg 44.7 kg   General exam: Alert, awake, oriented x 3; reporting improvement in her hand spasms and no further tingling.  Patient denies chest pain or shortness of breath.  Feeling ready to go home.  Chronically ill and underweight in appearance. Respiratory system: Clear to auscultation. Respiratory effort normal.  No using accessory muscle.  Good saturation on chronic supplementation. Cardiovascular system:RRR. No rubs or gallops; positive murmur appreciated.  No JVD. Gastrointestinal system: Abdomen is nondistended, soft and nontender. No organomegaly or masses felt. Normal bowel sounds heard. Central nervous system: Alert and oriented. No focal neurological deficits. Extremities: No cyanosis or clubbing. Skin: No petechiae. Psychiatry: Judgement and insight appear normal. Mood & affect appropriate.    Condition at discharge: Stable and improved.  The results of significant diagnostics from this hospitalization (including imaging, microbiology, ancillary and laboratory) are listed below for reference.    Imaging Studies: DG Chest 2 View  Result Date: 11/08/2021 CLINICAL DATA:  Weakness. EXAM: CHEST - 2 VIEW COMPARISON:  Chest x-ray 07/16/2021 FINDINGS: Patient is status post valve replacement, unchanged. Sternotomy wires are again noted. The cardiomediastinal silhouette is stable and within normal limits. The lungs are clear. There is no pleural effusion or pneumothorax. No acute fractures are seen. IMPRESSION: No active cardiopulmonary disease. Electronically Signed   By: Ronney Asters M.D.   On: 11/08/2021 18:51   CT HEAD WO CONTRAST  Result Date: 11/08/2021 CLINICAL DATA:  Stroke suspected. EXAM: CT HEAD WITHOUT CONTRAST TECHNIQUE: Contiguous axial images were obtained from the base of the skull through the vertex without intravenous contrast. RADIATION DOSE REDUCTION: This exam was performed according to the departmental dose-optimization program which includes automated exposure control, adjustment of the mA and/or kV according to patient size and/or use of iterative reconstruction technique. COMPARISON:  None Available. FINDINGS:  Brain: No evidence of acute infarction, hemorrhage, hydrocephalus, extra-axial collection or mass lesion/mass effect. Mild brain parenchymal volume loss. Vascular: No hyperdense vessel or unexpected calcification. Skull: Normal. Negative for fracture or focal lesion. Sinuses/Orbits: Mild sphenoid sinus disease. Other: None. IMPRESSION: 1. No acute intracranial abnormality. 2. Mild brain parenchymal volume loss. 3. Mild sphenoid sinus disease. Electronically Signed   By: Fidela Salisbury M.D.   On: 11/08/2021 18:36    Microbiology: Results for orders placed or performed during the hospital encounter of 07/16/21  Resp Panel by RT-PCR (Flu A&B, Covid) Nasopharyngeal Swab     Status: None   Collection Time: 07/16/21  5:17 PM   Specimen: Nasopharyngeal Swab; Nasopharyngeal(NP) swabs in vial transport medium  Result Value Ref Range Status   SARS Coronavirus 2 by RT PCR  NEGATIVE NEGATIVE Final    Comment: (NOTE) SARS-CoV-2 target nucleic acids are NOT DETECTED.  The SARS-CoV-2 RNA is generally detectable in upper respiratory specimens during the acute phase of infection. The lowest concentration of SARS-CoV-2 viral copies this assay can detect is 138 copies/mL. A negative result does not preclude SARS-Cov-2 infection and should not be used as the sole basis for treatment or other patient management decisions. A negative result may occur with  improper specimen collection/handling, submission of specimen other than nasopharyngeal swab, presence of viral mutation(s) within the areas targeted by this assay, and inadequate number of viral copies(<138 copies/mL). A negative result must be combined with clinical observations, patient history, and epidemiological information. The expected result is Negative.  Fact Sheet for Patients:  EntrepreneurPulse.com.au  Fact Sheet for Healthcare Providers:  IncredibleEmployment.be  This test is no t yet approved or cleared by the [place] FDA and  has been authorized for detection and/or diagnosis of SARS-CoV-2 by FDA under an Emergency Use Authorization (EUA). This EUA will remain  in effect (meaning this test can be used) for the duration of the COVID-19 declaration under Section 564(b)(1) of the Act, 21 U.S.C.section 360bbb-3(b)(1), unless the authorization is terminated  or revoked sooner.       Influenza A by PCR NEGATIVE NEGATIVE Final   Influenza B by PCR NEGATIVE NEGATIVE Final    Comment: (NOTE) The Xpert Xpress SARS-CoV-2/FLU/RSV plus assay is intended as an aid in the diagnosis of influenza from Nasopharyngeal swab specimens and should not be used as a sole basis for treatment. Nasal washings and aspirates are unacceptable for Xpert Xpress SARS-CoV-2/FLU/RSV testing.  Fact Sheet for Patients: EntrepreneurPulse.com.au  Fact Sheet for  Healthcare Providers: IncredibleEmployment.be  This test is not yet approved or cleared by the [place] FDA and has been authorized for detection and/or diagnosis of SARS-CoV-2 by FDA under an Emergency Use Authorization (EUA). This EUA will remain in effect (meaning this test can be used) for the duration of the COVID-19 declaration under Section 564(b)(1) of the Act, 21 U.S.C. section 360bbb-3(b)(1), unless the authorization is terminated or revoked.  Performed at [facility], [address] [place], [place] [address]     Labs: CBC: Recent Labs  Lab 11/08/21 1813 11/08/21 1826 11/09/21 0640  WBC 13.2*  --  8.3  NEUTROABS 7.4  --  4.1  HGB 12.4 13.6 9.6*  HCT 38.1 40.0 30.8*  MCV 84.5  --  88.8  PLT 340  --  191   Basic Metabolic Panel: Recent Labs  Lab 11/08/21 1813 11/08/21 1826 11/09/21 0640 11/10/21 0413  NA 141 142 141 141  K 2.3* 2.3* 4.4 4.8  CL 100 99 111  116*  CO2 29  --  26 25  GLUCOSE 82 82 94 91  BUN '20 19 15 13  '$ CREATININE 0.96 0.90 0.67 0.63  CALCIUM 8.8*  --  7.8* 8.2*  MG 2.3  --  2.1  --    Liver Function Tests: Recent Labs  Lab 11/08/21 1813 11/09/21 0640  AST 42* 31  ALT 23 20  ALKPHOS 89 67  BILITOT 0.8 0.6  PROT 7.5 5.7*  ALBUMIN 4.1 3.1*   CBG: No results for input(s): "GLUCAP" in the last 168 hours.  Discharge time spent: greater than 30 minutes.  Signed: Barton Dubois, MD [facility] 11/10/2021

## 2021-11-10 NOTE — Plan of Care (Signed)
  Problem: Education: Goal: Knowledge of General Education information will improve Description Including pain rating scale, medication(s)/side effects and non-pharmacologic comfort measures Outcome: Progressing   Problem: Health Behavior/Discharge Planning: Goal: Ability to manage health-related needs will improve Outcome: Progressing   

## 2021-11-10 NOTE — Progress Notes (Signed)
  Transition of Care [facility]) Screening Note   Patient Details  Name: Mallory Sutton Date of Birth: 1960/11/28   Transition of Care [facility]) CM/SW Contact:    Ihor Gully, LCSW Phone Number: 11/10/2021, 1:42 PM    Transition of Care Department [facility]) has reviewed patient and no TOC needs have been identified at this time. We will continue to monitor patient advancement through interdisciplinary progression rounds. If new patient transition needs arise, please place a TOC consult.

## 2021-11-10 NOTE — Assessment & Plan Note (Signed)
-   In the setting of hypocalcemia most likely and electrolytes impairment. -Improved/resolved after electrolytes repletion provided -Continue close monitoring and plan for further supplementation as required.

## 2021-11-10 NOTE — Progress Notes (Signed)
[place] for Warfarin Indication: atrial fibrillation, AVR  Allergies  Allergen Reactions   Amiodarone Nausea Only   Penicillins Hives    Has patient had a PCN reaction causing immediate rash, facial/tongue/throat swelling, SOB or lightheadedness with hypotension: YES Has patient had a PCN reaction causing severe rash involving mucus membranes or skin necrosis: NO Has patient had a PCN reaction that required hospitalization NO Has patient had a PCN reaction occurring within the last 10 years: NO If all of the above answers are "NO", then may proceed with Cephalosporin use.     Patient Measurements: Height: '5\' 4"'$  (162.6 cm) Weight: 44.7 kg (98 lb 9.6 oz) IBW/kg (Calculated) : 54.7   Vital Signs: Temp: 98.1 F (36.7 C) (07/10 0418) Temp Source: Oral (07/10 0418) BP: 148/97 (07/10 0418) Pulse Rate: 80 (07/10 0418)  Labs: Recent Labs    11/08/21 1813 11/08/21 1826 11/09/21 0640 11/10/21 0413  HGB 12.4 13.6 9.6*  --   HCT 38.1 40.0 30.8*  --   PLT 340  --  243  --   APTT 29  --   --   --   LABPROT 23.0*  --  23.8* 26.0*  INR 2.1*  --  2.2* 2.4*  CREATININE 0.96 0.90 0.67 0.63     Estimated Creatinine Clearance: 53.4 mL/min (by C-G formula based on SCr of 0.63 mg/dL).   Medical History: Past Medical History:  Diagnosis Date   Aortic stenosis    ATRIAL ARRHYTHMIAS    Bicuspid aortic valve    CHF (congestive heart failure) (HCC)    COARCTATION OF AORTA    ENDOMETRIOSIS    Heart murmur    Paroxysmal atrial fibrillation (HCC)    Shortness of breath    THORACIC AORTIC ANEURYSM     Medications:  PTA Warfarin ('2mg'$  daily except '1mg'$  on Sundays per patient)   Assessment: 61 yr female admitted with c/o sudden numbness, coldness and contractures of her hands.  PMH significant for AFib, AVR (on warfarin), CHF  INR therapeutic 2.4  Goal of Therapy:  INR 2-3    Plan:  Warfarin 2 mg po x 1 tonight Daily PT/INR  Margot Ables, PharmD Clinical Pharmacist 11/10/2021 7:55 AM

## 2021-11-10 NOTE — Assessment & Plan Note (Signed)
-  Body mass index is 16.92 kg/m. -Ensure twice a day has been recommended a feeding supplement. -Continue to follow weight and nutritional status.

## 2021-11-11 ENCOUNTER — Telehealth: Payer: Self-pay | Admitting: Cardiovascular Disease

## 2021-11-11 ENCOUNTER — Encounter: Payer: Self-pay | Admitting: Cardiovascular Disease

## 2021-11-11 NOTE — Telephone Encounter (Signed)
   Pt c/o medication issue:  1. Name of Medication: warfarin (COUMADIN) 1 MG tablet  2. How are you currently taking this medication (dosage and times per day)? Take 2 tablets (2 mg total) by mouth daily at 4 PM.  3. Are you having a reaction (difficulty breathing--STAT)?   4. What is your medication issue? Pt said, she is hypokalemia and was advised to drink ensure again with 20 mg vitamin K and she needs to drink it 2 day so that today of 40 mg vitamin k. She wanted to check in with Lattie Haw if that's ok since she is taking warfarin

## 2021-11-11 NOTE — Telephone Encounter (Signed)
Called and spoke with patient,  Hospital D/C summary reviewed.  Pt has a f/u INR appt on 11/20/21.  Last office INR was elevated.  Told pt to drink the 2 Ensures a day as ordered in hospital.  Will check INR next week and adjust dose then if needed.  Pt verbalized understanding.

## 2021-11-13 ENCOUNTER — Telehealth: Payer: Self-pay | Admitting: Pulmonary Disease

## 2021-11-13 NOTE — Telephone Encounter (Signed)
Called and spoke with pt who states that she had a panic attack after receiving a call from her insurance company that they were going to no longer be in business and due to this she will not be having any insurance. Pt said the next day she ended up having numbness in hands and was sent to the hospital and was admitted for 2 days.   While speaking with pt, she stated that she wanted to know if JD would refill her cough medicine as this really helps her cough at night when she lays down to go to sleep. Dr. Erin Fulling, please advise on this if you are okay refilling her med.

## 2021-11-13 NOTE — Telephone Encounter (Signed)
Which cough medicine is this?

## 2021-11-14 ENCOUNTER — Other Ambulatory Visit: Payer: Self-pay | Admitting: Pulmonary Disease

## 2021-11-14 NOTE — Telephone Encounter (Signed)
Called patient and told her that since [facility] did not fill this medication that he stated he was not refilling it. I told her the name of the provider that did fill the medication. Patient verbalized understanding. Nothing further needed

## 2021-11-14 NOTE — Telephone Encounter (Signed)
I did not prescribe this medication and will not refill it for her.  Thanks, JD

## 2021-11-14 NOTE — Telephone Encounter (Signed)
Called patient back and she states it is the  chlorpheniramine-HYDROcodone 10-8 MG/5ML  Please advise sir

## 2021-11-17 NOTE — Telephone Encounter (Signed)
Dr. Erin Fulling, please advise on refill request.

## 2021-11-20 ENCOUNTER — Ambulatory Visit (INDEPENDENT_AMBULATORY_CARE_PROVIDER_SITE_OTHER): Payer: 59 | Admitting: *Deleted

## 2021-11-20 DIAGNOSIS — I4891 Unspecified atrial fibrillation: Secondary | ICD-10-CM

## 2021-11-20 DIAGNOSIS — Z5181 Encounter for therapeutic drug level monitoring: Secondary | ICD-10-CM

## 2021-11-20 DIAGNOSIS — I4892 Unspecified atrial flutter: Secondary | ICD-10-CM | POA: Diagnosis not present

## 2021-11-20 DIAGNOSIS — Q231 Congenital insufficiency of aortic valve: Secondary | ICD-10-CM | POA: Diagnosis not present

## 2021-11-20 LAB — POCT INR: INR: 1.3 — AB (ref 2.0–3.0)

## 2021-11-20 NOTE — Patient Instructions (Signed)
Increase warfarin to 2 tablets daily except 3 tablets on Thursdays Recheck in 1 weeks

## 2021-11-28 ENCOUNTER — Telehealth: Payer: Self-pay | Admitting: Pulmonary Disease

## 2021-11-28 ENCOUNTER — Other Ambulatory Visit (HOSPITAL_COMMUNITY): Payer: Self-pay | Admitting: Family Medicine

## 2021-11-28 DIAGNOSIS — Z1231 Encounter for screening mammogram for malignant neoplasm of breast: Secondary | ICD-10-CM

## 2021-11-28 NOTE — Telephone Encounter (Signed)
Called Magda Paganini but she was not available. Will call back later. Cough syrup was denied last week.

## 2021-12-01 ENCOUNTER — Ambulatory Visit (INDEPENDENT_AMBULATORY_CARE_PROVIDER_SITE_OTHER): Payer: 59 | Admitting: *Deleted

## 2021-12-01 DIAGNOSIS — I4891 Unspecified atrial fibrillation: Secondary | ICD-10-CM | POA: Diagnosis not present

## 2021-12-01 DIAGNOSIS — I4892 Unspecified atrial flutter: Secondary | ICD-10-CM | POA: Diagnosis not present

## 2021-12-01 DIAGNOSIS — Q231 Congenital insufficiency of aortic valve: Secondary | ICD-10-CM | POA: Diagnosis not present

## 2021-12-01 DIAGNOSIS — Z5181 Encounter for therapeutic drug level monitoring: Secondary | ICD-10-CM | POA: Diagnosis not present

## 2021-12-01 LAB — POCT INR: INR: 1.4 — AB (ref 2.0–3.0)

## 2021-12-01 NOTE — Patient Instructions (Signed)
Take warfarin 3 tablets tonight then increase dose to 2 tablets daily except 3 tablets on Tuesdays, Thursdays and Saturdays Recheck in 1 week Continue Ensure 2 daily per MD

## 2021-12-02 NOTE — Telephone Encounter (Signed)
Mallory Sutton returned the call. She wanted to know if there was a reason Dr. Erin Fulling did not want to write the RX for the narcotic cough syrup. I advised her that he was not the original prescriber and would not refill it for her. She wanted to know if he had any other suggestions for cough management that their NP could prescribe for the patient.   Dr. Erin Fulling, can you please advise? Thanks!

## 2021-12-02 NOTE — Telephone Encounter (Signed)
ATC leslie. LVMTCB.

## 2021-12-02 NOTE — Telephone Encounter (Signed)
Patient is currently on narcotic medication and benzodiazepines. The cough syrup she is requesting has more narcotic medication in it and I am concern about all of those prescriptions together.   I would recommend she be scheduled for video visit or clinic visit with APP or myself to further discuss her cough symptoms to determine the etiology of the cough if possible.   Other medications to try would be tessalon perles in the meantime. Ok to send that prescription in.  Thanks, JD

## 2021-12-03 NOTE — Telephone Encounter (Signed)
Called Mallory Sutton and there was again, no answer- LMTCB and closing this per protocol.

## 2021-12-04 ENCOUNTER — Telehealth: Payer: Self-pay | Admitting: Pulmonary Disease

## 2021-12-04 NOTE — Telephone Encounter (Signed)
Mallory Sutton is calling for recommendations for a chronic cough medication for patient to use at night. Dr Erin Fulling last saw pt 10/2021.  Please advise sir

## 2021-12-05 NOTE — Telephone Encounter (Signed)
Called Magda Paganini back and left a message with Dr Lisabeth Pick recommendations for patient in regards to her cough and cough medication. Nothing further needed

## 2021-12-05 NOTE — Telephone Encounter (Signed)
I would recommend she be scheduled for video visit or clinic visit with APP or myself to further discuss her cough symptoms to determine the etiology of the cough if possible.   If the primary team wants to prescribe the cough syrup she wants that is up to them.   Thanks, JD

## 2021-12-10 ENCOUNTER — Ambulatory Visit (INDEPENDENT_AMBULATORY_CARE_PROVIDER_SITE_OTHER): Payer: 59 | Admitting: *Deleted

## 2021-12-10 DIAGNOSIS — I4892 Unspecified atrial flutter: Secondary | ICD-10-CM

## 2021-12-10 DIAGNOSIS — Q231 Congenital insufficiency of aortic valve: Secondary | ICD-10-CM | POA: Diagnosis not present

## 2021-12-10 DIAGNOSIS — Z5181 Encounter for therapeutic drug level monitoring: Secondary | ICD-10-CM | POA: Diagnosis not present

## 2021-12-10 LAB — POCT INR: INR: 1.7 — AB (ref 2.0–3.0)

## 2021-12-10 NOTE — Patient Instructions (Signed)
Take warfarin 3 tablets tonight then increase dose to 3 tablets daily except 2 tablets on Sundays and Wednesdays Recheck in 1 week Continue Ensure 2 daily per MD

## 2021-12-12 ENCOUNTER — Encounter: Payer: Self-pay | Admitting: Cardiovascular Disease

## 2021-12-18 DIAGNOSIS — D485 Neoplasm of uncertain behavior of skin: Secondary | ICD-10-CM | POA: Diagnosis not present

## 2021-12-18 DIAGNOSIS — L720 Epidermal cyst: Secondary | ICD-10-CM | POA: Diagnosis not present

## 2021-12-18 DIAGNOSIS — C44319 Basal cell carcinoma of skin of other parts of face: Secondary | ICD-10-CM | POA: Diagnosis not present

## 2021-12-18 DIAGNOSIS — C44311 Basal cell carcinoma of skin of nose: Secondary | ICD-10-CM | POA: Diagnosis not present

## 2021-12-22 ENCOUNTER — Ambulatory Visit (INDEPENDENT_AMBULATORY_CARE_PROVIDER_SITE_OTHER): Payer: 59 | Admitting: *Deleted

## 2021-12-22 DIAGNOSIS — I4891 Unspecified atrial fibrillation: Secondary | ICD-10-CM

## 2021-12-22 DIAGNOSIS — Z5181 Encounter for therapeutic drug level monitoring: Secondary | ICD-10-CM

## 2021-12-22 DIAGNOSIS — I4892 Unspecified atrial flutter: Secondary | ICD-10-CM | POA: Diagnosis not present

## 2021-12-22 DIAGNOSIS — Q231 Congenital insufficiency of aortic valve: Secondary | ICD-10-CM

## 2021-12-22 LAB — POCT INR: INR: 2.3 (ref 2.0–3.0)

## 2021-12-22 NOTE — Patient Instructions (Signed)
Continue warfarin 3 tablets daily except 2 tablets on Sundays and Wednesdays Recheck in 2 week Continue Ensure 2 daily per MD

## 2021-12-30 ENCOUNTER — Other Ambulatory Visit (HOSPITAL_COMMUNITY): Payer: Self-pay

## 2021-12-30 ENCOUNTER — Telehealth: Payer: Self-pay

## 2021-12-31 DIAGNOSIS — E785 Hyperlipidemia, unspecified: Secondary | ICD-10-CM | POA: Diagnosis not present

## 2021-12-31 DIAGNOSIS — E559 Vitamin D deficiency, unspecified: Secondary | ICD-10-CM | POA: Diagnosis not present

## 2021-12-31 DIAGNOSIS — R7301 Impaired fasting glucose: Secondary | ICD-10-CM | POA: Diagnosis not present

## 2021-12-31 DIAGNOSIS — R944 Abnormal results of kidney function studies: Secondary | ICD-10-CM | POA: Diagnosis not present

## 2021-12-31 NOTE — Telephone Encounter (Signed)
Please apply for PA as patient did not tolerate trelegy in the past and benefited most from breztri.  Thanks, JD

## 2021-12-31 NOTE — Telephone Encounter (Signed)
Dr Erin Fulling,  Please advise on message from pharmacy:  Received a PA request for [facility]. Ran a test claim for Trelegy successfully for a $50.00 co-pay. Please advise if Judithann Sauger is preferred and if we should continue with PA.   Thank you

## 2022-01-01 ENCOUNTER — Other Ambulatory Visit (HOSPITAL_COMMUNITY): Payer: Self-pay

## 2022-01-01 NOTE — Telephone Encounter (Signed)
Patient Advocate Encounter   Received notification that prior authorization is required for [facility] Aerosphere 160-9-4.8MCG/ACT aerosol  Submitted: 01-01-2022 Key [id number]

## 2022-01-01 NOTE — Telephone Encounter (Signed)
Sending additional information to insurance per their request

## 2022-01-02 ENCOUNTER — Telehealth: Payer: Self-pay | Admitting: Pulmonary Disease

## 2022-01-02 DIAGNOSIS — J449 Chronic obstructive pulmonary disease, unspecified: Secondary | ICD-10-CM

## 2022-01-02 NOTE — Telephone Encounter (Signed)
Mallory Sutton, CPhT      01/02/22  9:54 AM Note Patient Advocate Encounter   Received a fax regarding Prior Authorization for [employer] 160-9-4.8MCG/ACT aerosol.    Authorization has been DENIED due to need for documentation of try/fail of formulary alternatives.   This plan covers Symbicort, Advair, Wixela, Breo, [place], Trelegy, Anoro, Bevespi.     Dr. Erin Fulling, please advise on this what you recommend [place] prescribing in place of Breztri.

## 2022-01-02 NOTE — Telephone Encounter (Signed)
Patient Advocate Encounter  Received a fax regarding Prior Authorization for [employer] 160-9-4.8MCG/ACT aerosol.   Authorization has been DENIED due to need for documentation of try/fail of formulary alternatives.  This plan covers Symbicort, Advair, Wixela, Breo, [place], Trelegy, Anoro, Bevespi.  Denial letter attached to patient chart.

## 2022-01-07 DIAGNOSIS — G629 Polyneuropathy, unspecified: Secondary | ICD-10-CM | POA: Diagnosis not present

## 2022-01-07 DIAGNOSIS — L729 Follicular cyst of the skin and subcutaneous tissue, unspecified: Secondary | ICD-10-CM | POA: Diagnosis not present

## 2022-01-07 DIAGNOSIS — R944 Abnormal results of kidney function studies: Secondary | ICD-10-CM | POA: Diagnosis not present

## 2022-01-07 DIAGNOSIS — E785 Hyperlipidemia, unspecified: Secondary | ICD-10-CM | POA: Diagnosis not present

## 2022-01-07 DIAGNOSIS — Z23 Encounter for immunization: Secondary | ICD-10-CM | POA: Diagnosis not present

## 2022-01-07 DIAGNOSIS — J449 Chronic obstructive pulmonary disease, unspecified: Secondary | ICD-10-CM | POA: Diagnosis not present

## 2022-01-07 DIAGNOSIS — Q251 Coarctation of aorta: Secondary | ICD-10-CM | POA: Diagnosis not present

## 2022-01-07 DIAGNOSIS — D72829 Elevated white blood cell count, unspecified: Secondary | ICD-10-CM | POA: Diagnosis not present

## 2022-01-07 DIAGNOSIS — I482 Chronic atrial fibrillation, unspecified: Secondary | ICD-10-CM | POA: Diagnosis not present

## 2022-01-07 DIAGNOSIS — E876 Hypokalemia: Secondary | ICD-10-CM | POA: Diagnosis not present

## 2022-01-07 DIAGNOSIS — R69 Illness, unspecified: Secondary | ICD-10-CM | POA: Diagnosis not present

## 2022-01-07 DIAGNOSIS — R636 Underweight: Secondary | ICD-10-CM | POA: Diagnosis not present

## 2022-01-08 ENCOUNTER — Ambulatory Visit: Payer: 59 | Attending: Cardiovascular Disease | Admitting: *Deleted

## 2022-01-08 DIAGNOSIS — Q231 Congenital insufficiency of aortic valve: Secondary | ICD-10-CM | POA: Diagnosis not present

## 2022-01-08 DIAGNOSIS — I4892 Unspecified atrial flutter: Secondary | ICD-10-CM | POA: Diagnosis not present

## 2022-01-08 DIAGNOSIS — Z5181 Encounter for therapeutic drug level monitoring: Secondary | ICD-10-CM

## 2022-01-08 LAB — POCT INR: INR: 2.2 (ref 2.0–3.0)

## 2022-01-08 MED ORDER — SPIRIVA RESPIMAT 2.5 MCG/ACT IN AERS: 2.0000 | INHALATION_SPRAY | Freq: Every day | RESPIRATORY_TRACT | 11 refills | Status: DC

## 2022-01-08 MED ORDER — BUDESONIDE-FORMOTEROL FUMARATE 160-4.5 MCG/ACT IN AERO: 2.0000 | INHALATION_SPRAY | Freq: Two times a day (BID) | RESPIRATORY_TRACT | 6 refills | Status: DC

## 2022-01-08 NOTE — Telephone Encounter (Signed)
Called and spoke with pt letting her know info per JD and she verbalized understanding. Nothing further needed.

## 2022-01-08 NOTE — Telephone Encounter (Signed)
Symbicort 160-4.47mg 2 puffs twice daily and spiriva 2.568m 2 puff daily sent in to pharmacy.

## 2022-01-08 NOTE — Patient Instructions (Signed)
Continue warfarin 3 tablets daily except 2 tablets on Sundays and Wednesdays Recheck in 3 week Continue Ensure 2 daily per MD 

## 2022-01-14 ENCOUNTER — Other Ambulatory Visit: Payer: Self-pay

## 2022-01-14 ENCOUNTER — Other Ambulatory Visit: Payer: Self-pay | Admitting: Cardiovascular Disease

## 2022-01-21 ENCOUNTER — Ambulatory Visit (HOSPITAL_COMMUNITY)
Admission: RE | Admit: 2022-01-21 | Discharge: 2022-01-21 | Disposition: A | Discharge status: Home / Self Care | Payer: 59 | Source: Ambulatory Visit | Attending: Family Medicine | Admitting: Family Medicine

## 2022-01-21 ENCOUNTER — Other Ambulatory Visit (HOSPITAL_COMMUNITY): Payer: Self-pay | Admitting: Family Medicine

## 2022-01-21 DIAGNOSIS — R5383 Other fatigue: Secondary | ICD-10-CM | POA: Diagnosis not present

## 2022-01-21 DIAGNOSIS — R0902 Hypoxemia: Secondary | ICD-10-CM | POA: Insufficient documentation

## 2022-01-21 DIAGNOSIS — L989 Disorder of the skin and subcutaneous tissue, unspecified: Secondary | ICD-10-CM | POA: Diagnosis not present

## 2022-01-21 DIAGNOSIS — R0789 Other chest pain: Secondary | ICD-10-CM | POA: Diagnosis not present

## 2022-01-21 DIAGNOSIS — L959 Vasculitis limited to the skin, unspecified: Secondary | ICD-10-CM | POA: Diagnosis not present

## 2022-01-22 DIAGNOSIS — J441 Chronic obstructive pulmonary disease with (acute) exacerbation: Secondary | ICD-10-CM | POA: Diagnosis not present

## 2022-01-22 DIAGNOSIS — J9601 Acute respiratory failure with hypoxia: Secondary | ICD-10-CM | POA: Diagnosis not present

## 2022-01-23 ENCOUNTER — Encounter: Payer: Self-pay | Admitting: Cardiovascular Disease

## 2022-01-25 NOTE — Progress Notes (Signed)
Office Visit    Patient Name: Mallory Sutton Date of Encounter: 01/25/2022  Primary Care Provider:  Celene Squibb, MD Primary Cardiologist:  Jenkins Rouge, MD Primary Electrophysiologist: None  Chief Complaint    Mallory Sutton is a 61 y.o. female with PMH of coarctation s/p repair at age 9, PAF (on Coumadin) bicuspid AV s/p mechanical AVR with root replacement 07/2014 with maze, HTN who presents today for shortness of breath and possible pleural effusion seen on chest x-ray.  Past Medical History    Past Medical History:  Diagnosis Date   Aortic stenosis    ATRIAL ARRHYTHMIAS    Bicuspid aortic valve    CHF (congestive heart failure) (HCC)    COARCTATION OF AORTA    ENDOMETRIOSIS    Heart murmur    Paroxysmal atrial fibrillation (HCC)    Shortness of breath    THORACIC AORTIC ANEURYSM    Past Surgical History:  Procedure Laterality Date   CARDIOVERSION N/A 08/29/2019   Procedure: CARDIOVERSION;  Surgeon: Arnoldo Lenis, MD;  Location: [facility];  Service: Endoscopy;  Laterality: N/A;   coarctation repair and residual restenosis     KNEE SURGERY     TEE WITHOUT CARDIOVERSION N/A 07/12/2019   Procedure: TRANSESOPHAGEAL ECHOCARDIOGRAM (TEE);  Surgeon: Sueanne Margarita, MD;  Location: [facility] ENDOSCOPY;  Service: Cardiovascular;  Laterality: N/A;   TEE WITHOUT CARDIOVERSION N/A 08/29/2019   Procedure: TRANSESOPHAGEAL ECHOCARDIOGRAM (TEE) WITH PROPOFOL;  Surgeon: Arnoldo Lenis, MD;  Location: [facility];  Service: Endoscopy;  Laterality: N/A;    Allergies  Allergies  Allergen Reactions   Amiodarone Nausea Only   Penicillins Hives    Has patient had a PCN reaction causing immediate rash, facial/tongue/throat swelling, SOB or lightheadedness with hypotension: YES Has patient had a PCN reaction causing severe rash involving mucus membranes or skin necrosis: NO Has patient had a PCN reaction that required hospitalization NO Has patient had a PCN reaction occurring within the last 10  years: NO If all of the above answers are "NO", then may proceed with Cephalosporin use.     History of Present Illness    Mallory Sutton  is a 61 year old female with the above mention past medical history who presents today for shortness of breath.  [name] has been followed by Dr. [name] for many years and has a complex medical history consisting of coarctation repair as a child.  She underwent aortic stenosis repair with maze  for bicuspid aortic valve. This was complicated by pleural effusions and PAF that required thoracentesis.  She is currently managed with Rythmol and Coumadin for atrial fibrillation and mechanical valve repair.  She is followed by Dr. Lovena Le for management.  Most recent 2D echo was completed 07/2021 with EF of 60-65%, no RWMA, moderate LVH, mild elevated PA systolic pressure, moderately dilated LA/mildly dilated RA with no ascending aorta dilation noted.  She was hospitalized 07/2021 due to COPD exacerbation and worsening dyspnea.  She was started on Symbicort for maintenance therapy and 2 L nasal cannula to be used with activity.  She was also started on steroid taper.  She received Lasix and was advised to follow low-sodium diet and was also discharged with 80 mg of Lasix daily.  She was last seen by Dr. [name] on 08/2021 for posthospital follow-up.  She was euvolemic on examination and last 2D echo showed no evidence of pulmonary hypertension.  No medication changes were made during visit.  She was  seen 11/2021 in the ED as well for numbness that was associated with electrolyte abnormalities secondary to poor p.o. intake and nutritional deficit.  She was encouraged to use feeding supplements twice a day to supplement.  [name] presents today for follow-up with her daughter.  Since last being seen in the office patient reports she is noticed progressive shortness of breath with lying flat and at rest.  She had a chest x-ray completed recently that showed resolution of pleural  effusions however fluid was seen around her heart.  Patient is euvolemic today in office and denies any chest discomfort or dizziness.  She has recently started Spiriva and Symbicort inhalers due to affordability issues with [facility] [employer]..  Patient denies chest pain, palpitations, dyspnea, PND, orthopnea, nausea, vomiting, dizziness, syncope, edema, weight gain, or early satiety.    Home Medications    Current Outpatient Medications  Medication Sig Dispense Refill   albuterol (VENTOLIN HFA) 108 (90 Base) MCG/ACT inhaler Inhale 2 puffs into the lungs every 6 (six) hours as needed for wheezing or shortness of breath. 8 g 6   aspirin EC 81 MG tablet Take 81 mg by mouth daily.     atorvastatin (LIPITOR) 10 MG tablet Take 10 mg by mouth daily.     budesonide-formoterol (SYMBICORT) 160-4.5 MCG/ACT inhaler Inhale 2 puffs into the lungs 2 (two) times daily. 1 each 6   calcium-vitamin D (OSCAL WITH D) 500-5 MG-MCG tablet Take 1 tablet by mouth daily. 30 tablet 2   diazepam (VALIUM) 10 MG tablet Take 10 mg by mouth daily as needed for sleep.     feeding supplement (ENSURE ENLIVE / ENSURE PLUS) LIQD Take 237 mLs by mouth 2 (two) times daily between meals.     furosemide (LASIX) 40 MG tablet Take 80 mg by mouth 2 (two) times daily.     gabapentin (NEURONTIN) 300 MG capsule Take 300-600 mg by mouth See admin instructions. Take 300 mg in the morning, 300 mg in the afternoon, and 600 mg at bedtime     HYDROcodone-acetaminophen (NORCO/VICODIN) 5-325 MG tablet Take 2 tablets by mouth 2 (two) times daily.     ipratropium-albuterol (DUONEB) 0.5-2.5 (3) MG/3ML SOLN SMARTSIG:3 Milliliter(s) Via Nebulizer Twice Daily PRN     magnesium oxide (MAGOX 400) 400 (240 Mg) MG tablet Take 1 tablet (400 mg total) by mouth every other day. 30 tablet 2   metoprolol succinate (TOPROL-XL) 25 MG 24 hr tablet TAKE 1 TABLET BY MOUTH ONCE DAILY. (Patient taking differently: Take 25 mg by mouth daily.) 90 tablet 3   Multiple Vitamin  (MULTI-VITAMINS) TABS Take 1 tablet by mouth daily.      ondansetron (ZOFRAN ODT) 4 MG disintegrating tablet Take 1 tablet (4 mg total) by mouth every 8 (eight) hours as needed for nausea or vomiting. 20 tablet 0   pantoprazole (PROTONIX) 40 MG tablet Take 1 tablet (40 mg total) by mouth 2 (two) times daily before a meal. 60 tablet 1   potassium chloride SA (KLOR-CON M) 20 MEQ tablet Take 2 tablets (40 mEq total) by mouth daily. 90 tablet 2   propafenone (RYTHMOL) 300 MG tablet TAKE 1 TABLET BY MOUTH THREE TIMES A DAY. 90 tablet 1   sertraline (ZOLOFT) 100 MG tablet Take 100 mg by mouth daily.     Tiotropium Bromide Monohydrate (SPIRIVA RESPIMAT) 2.5 MCG/ACT AERS Inhale 2 puffs into the lungs daily. 4 g 11   Turmeric 500 MG CAPS Take 500 mg by mouth 3 (three) times a week.  warfarin (COUMADIN) 1 MG tablet Take 2 tablets (2 mg total) by mouth daily at 4 PM. 90 tablet 3   No current facility-administered medications for this visit.     Review of Systems  Please see the history of present illness.    (+) Shortness of breath All other systems reviewed and are otherwise negative except as noted above.  Physical Exam    Wt Readings from Last 3 Encounters:  11/08/21 98 lb 9.6 oz (44.7 kg)  11/05/21 100 lb (45.4 kg)  10/07/21 100 lb 3.2 oz (45.5 kg)   ER:XVQMG were no vitals filed for this visit.,There is no height or weight on file to calculate BMI.  Constitutional:      Appearance: Healthy appearance. Not in distress.  Neck:     Vascular: JVD normal.  Pulmonary:     Effort: Pulmonary effort is normal.     Breath sounds:  wheezing. No rales. Diminished in the bases Cardiovascular:     Normal rate. Regular rhythm. Normal S1. Normal S2.      Murmurs: There is no murmur.  Edema:    Peripheral edema absent.  Abdominal:     Palpations: Abdomen is soft non tender. There is no hepatomegaly.  Skin:    General: Skin is warm and dry.  Neurological:     General: No focal deficit present.      Mental Status: Alert and oriented to person, place and time.     Cranial Nerves: Cranial nerves are intact.  EKG/LABS/Other Studies Reviewed    ECG personally reviewed by me today -none completed today   Lab Results  Component Value Date   WBC 8.3 11/09/2021   HGB 9.6 (L) 11/09/2021   HCT 30.8 (L) 11/09/2021   MCV 88.8 11/09/2021   PLT 243 11/09/2021   Lab Results  Component Value Date   CREATININE 0.63 11/10/2021   BUN 13 11/10/2021   NA 141 11/10/2021   K 4.8 11/10/2021   CL 116 (H) 11/10/2021   CO2 25 11/10/2021   Lab Results  Component Value Date   ALT 20 11/09/2021   AST 31 11/09/2021   ALKPHOS 67 11/09/2021   BILITOT 0.6 11/09/2021   Lab Results  Component Value Date   CHOL 144 01/23/2015   HDL 44 01/23/2015   LDLCALC 83 01/23/2015   TRIG 87 01/23/2015   CHOLHDL 3.3 01/23/2015    No results found for: "HGBA1C"  Assessment & Plan    1.  History of bicuspid aortic valve - s/p bicuspid AV replacement with mechanical valve 2016  -Patient currently on Coumadin with INR managed goal of 2.5 -SBE prophylaxis discussed -Today patient is currently stable with no excess fluid volume present  2.  COPD/shortness of breath: -Patient had recent hospitalization for COPD exacerbation with no history of smoking -Most recent 2D echo showed no pulmonary hypertension -She is currently followed by pulmonology for management of COPD -Case discussed with DOD Dr.Varanasi who agrees with plan of increasing Lasix to 120 mg x 3 days and then back to 80 mg daily -We will check BMET and magnesium in 1 week -We will also recheck chest x-ray to evaluate fluid volume around lungs  3.  Atrial fibrillation: -Rhythm currently managed with Rythmol and beta-blocker patient is followed by Dr. Lovena Le for management of the rhythm -She is on Coumadin for anticoagulation  4.  Chronic diastolic CHF: -2D echo was completed 07/2021 with EF of 60-65%, no RWMA, moderate LVH, mild elevated PA  systolic  pressure, moderately dilated LA/mildly dilated RA with no ascending aorta dilation noted  Disposition: Follow-up with Jenkins Rouge, MD or APP in 3 months    Medication Adjustments/Labs and Tests Ordered: Current medicines are reviewed at length with the patient today.  Concerns regarding medicines are outlined above.   Signed, Mable Fill, Marissa Nestle, NP 01/25/2022, 1:09 PM [facility]  Note:  This document was prepared using Dragon voice recognition software and may include unintentional dictation errors.

## 2022-01-26 ENCOUNTER — Other Ambulatory Visit: Payer: Self-pay | Admitting: *Deleted

## 2022-01-26 ENCOUNTER — Encounter: Payer: Self-pay | Admitting: Nurse Practitioner

## 2022-01-26 ENCOUNTER — Ambulatory Visit: Payer: 59 | Attending: Nurse Practitioner | Admitting: Nurse Practitioner

## 2022-01-26 VITALS — BP 128/62 | HR 67 | Ht 64.0 in | Wt 102.0 lb

## 2022-01-26 DIAGNOSIS — I5032 Chronic diastolic (congestive) heart failure: Secondary | ICD-10-CM | POA: Diagnosis not present

## 2022-01-26 DIAGNOSIS — I4891 Unspecified atrial fibrillation: Secondary | ICD-10-CM | POA: Diagnosis not present

## 2022-01-26 DIAGNOSIS — R0602 Shortness of breath: Secondary | ICD-10-CM | POA: Diagnosis not present

## 2022-01-26 DIAGNOSIS — Q231 Congenital insufficiency of aortic valve: Secondary | ICD-10-CM | POA: Diagnosis not present

## 2022-01-26 NOTE — Patient Instructions (Signed)
Medication Instructions:   INCREASE Lasix three (3) tablets by mouth ( 120 mg) X 3 days than back to two (2) tablets by mouth ( 80 mg) daily.   *If you need a refill on your cardiac medications before your next appointment, please call your pharmacy*   Lab Work:  Your physician recommends that you return for lab work on Tuesday, October 3 in [place] at [facility].   If you have labs (blood work) drawn today and your tests are completely normal, you will receive your results only by: [place] (if you have MyChart) OR A paper copy in the mail If you have any lab test that is abnormal or we need to change your treatment, we will call you to review the results.   Testing/Procedures:  A chest x-ray takes a picture of the organs and structures inside the chest, including the heart, lungs, and blood vessels. This test can show several things, including, whether the heart is enlarges; whether fluid is building up in the lungs; and whether pacemaker / defibrillator leads are still in place. At [facility]     Follow-Up: At [facility], you and your health needs are our priority.  As part of our continuing mission to provide you with exceptional heart care, we have created designated Provider Care Teams.  These Care Teams include your primary Cardiologist (physician) and Advanced Practice Providers (APPs -  Physician Assistants and Nurse Practitioners) who all work together to provide you with the care you need, when you need it.  We recommend signing up for the patient portal called "MyChart".  Sign up information is provided on this After Visit Summary.  MyChart is used to connect with patients for Virtual Visits (Telemedicine).  Patients are able to view lab/test results, encounter notes, upcoming appointments, etc.  Non-urgent messages can be sent to your provider as well.   To learn more about what you can do with MyChart, go to [email or web address].    Your next  appointment:   3 month(s)  The format for your next appointment:   In Person  Provider:   Jenkins Rouge, MD    Important Information About Sugar

## 2022-01-27 DIAGNOSIS — C44311 Basal cell carcinoma of skin of nose: Secondary | ICD-10-CM | POA: Diagnosis not present

## 2022-01-28 ENCOUNTER — Telehealth: Payer: Self-pay | Admitting: Pulmonary Disease

## 2022-01-30 NOTE — Telephone Encounter (Signed)
Called patient but she did not answer. Left message for her to call back.  

## 2022-02-02 ENCOUNTER — Ambulatory Visit: Payer: 59 | Attending: Cardiovascular Disease | Admitting: *Deleted

## 2022-02-02 DIAGNOSIS — Z5181 Encounter for therapeutic drug level monitoring: Secondary | ICD-10-CM

## 2022-02-02 DIAGNOSIS — I4891 Unspecified atrial fibrillation: Secondary | ICD-10-CM | POA: Diagnosis not present

## 2022-02-02 DIAGNOSIS — Q231 Congenital insufficiency of aortic valve: Secondary | ICD-10-CM

## 2022-02-02 DIAGNOSIS — I4892 Unspecified atrial flutter: Secondary | ICD-10-CM

## 2022-02-02 LAB — POCT INR: INR: 3.5 — AB (ref 2.0–3.0)

## 2022-02-02 MED ORDER — WARFARIN SODIUM 1 MG PO TABS: ORAL_TABLET | ORAL | 5 refills | Status: DC

## 2022-02-02 NOTE — Addendum Note (Signed)
Addended by: Malen Gauze on: 02/02/2022 03:48 PM   Modules accepted: Orders

## 2022-02-02 NOTE — Patient Instructions (Signed)
Hold warfarin tonight then resume 3 tablets daily except 2 tablets on Sundays and Wednesdays Recheck in 3 week Continue Ensure 2 daily per MD

## 2022-02-03 ENCOUNTER — Other Ambulatory Visit (HOSPITAL_COMMUNITY)
Admission: RE | Admit: 2022-02-03 | Discharge: 2022-02-03 | Disposition: A | Discharge status: Home / Self Care | Payer: 59 | Source: Ambulatory Visit | Attending: Nurse Practitioner | Admitting: Nurse Practitioner

## 2022-02-03 ENCOUNTER — Ambulatory Visit (HOSPITAL_COMMUNITY)
Admission: RE | Admit: 2022-02-03 | Discharge: 2022-02-03 | Disposition: A | Discharge status: Home / Self Care | Payer: 59 | Source: Ambulatory Visit | Attending: Nurse Practitioner | Admitting: Nurse Practitioner

## 2022-02-03 DIAGNOSIS — R079 Chest pain, unspecified: Secondary | ICD-10-CM | POA: Diagnosis not present

## 2022-02-03 DIAGNOSIS — R0602 Shortness of breath: Secondary | ICD-10-CM | POA: Insufficient documentation

## 2022-02-03 DIAGNOSIS — I350 Nonrheumatic aortic (valve) stenosis: Secondary | ICD-10-CM | POA: Diagnosis not present

## 2022-02-03 DIAGNOSIS — I4891 Unspecified atrial fibrillation: Secondary | ICD-10-CM | POA: Diagnosis not present

## 2022-02-03 LAB — BASIC METABOLIC PANEL
Anion gap: 11 (ref 5–15)
BUN: 16 mg/dL (ref 6–20)
CO2: 25 mmol/L (ref 22–32)
Calcium: 9.3 mg/dL (ref 8.9–10.3)
Chloride: 104 mmol/L (ref 98–111)
Creatinine, Ser: 0.93 mg/dL (ref 0.44–1.00)
GFR, Estimated: >60 mL/min (ref >60)
Glucose, Bld: 93 mg/dL (ref 70–99)
Potassium: 3.4 mmol/L — ABNORMAL LOW (ref 3.5–5.1)
Sodium: 140 mmol/L (ref 135–145)

## 2022-02-03 LAB — MAGNESIUM: Magnesium: 2.7 mg/dL — ABNORMAL HIGH (ref 1.7–2.4)

## 2022-02-04 ENCOUNTER — Encounter: Payer: Self-pay | Admitting: Cardiovascular Disease

## 2022-02-04 ENCOUNTER — Telehealth: Payer: Self-pay

## 2022-02-04 NOTE — Telephone Encounter (Signed)
Called patient to share results per Ambrose Pancoast, PA-C;  Left message to call back.

## 2022-02-04 NOTE — Telephone Encounter (Signed)
Renal function is normal and potassium slightly below normal and should normalize with regular Lasix dose.  Magnesium is stable and slightly elevated due to mag oxalate.  No changes needed at this time continue current treatment plan.   Mallory Pancoast, NP  Pt called back and stated she reviewed [name], NP's assessment of her lab results.    Pt stated that since 02/02/2022, she has felt palpitations / fatigue / intermittent chest pain. Pt states she thinks she is in Atrial Fib, but today has felt more short of breath, chest pain or a chest tightness feeling with increased fatigue.    I checked our provider schedules for today, and they were all booked on Mallory Sutton inquired about Mallory Sutton, but unsure if they had any appointments available?  Pt advised with multiple symptoms this afternoon to be checked by a provider in the nearest ER.  Pt stated she would call  since she lives there, but Pt understood my concerns regarding her chest pain / shortness of breath and fatigue, and to go to nearest ER.

## 2022-02-04 NOTE — Telephone Encounter (Signed)
-----   Message from Marylu Lund., NP sent at 02/03/2022  5:34 PM EDT ----- Renal function is normal and potassium slightly below normal and should normalize with regular Lasix dose.  Magnesium is stable and slightly elevated due to mag oxalate.  No changes needed at this time continue current treatment plan.  Ambrose Pancoast, NP

## 2022-02-05 ENCOUNTER — Encounter: Payer: Self-pay | Admitting: Cardiovascular Disease

## 2022-02-06 ENCOUNTER — Ambulatory Visit: Payer: 59 | Attending: Cardiovascular Disease | Admitting: Cardiovascular Disease

## 2022-02-06 ENCOUNTER — Encounter: Payer: Self-pay | Admitting: Cardiovascular Disease

## 2022-02-06 VITALS — BP 136/60 | HR 54 | Ht 64.0 in | Wt 103.8 lb

## 2022-02-06 DIAGNOSIS — E876 Hypokalemia: Secondary | ICD-10-CM

## 2022-02-06 DIAGNOSIS — I4892 Unspecified atrial flutter: Secondary | ICD-10-CM | POA: Diagnosis not present

## 2022-02-06 DIAGNOSIS — Z952 Presence of prosthetic heart valve: Secondary | ICD-10-CM

## 2022-02-06 DIAGNOSIS — Z9889 Other specified postprocedural states: Secondary | ICD-10-CM | POA: Diagnosis not present

## 2022-02-06 DIAGNOSIS — Z8774 Personal history of (corrected) congenital malformations of heart and circulatory system: Secondary | ICD-10-CM | POA: Diagnosis not present

## 2022-02-06 DIAGNOSIS — I444 Left anterior fascicular block: Secondary | ICD-10-CM | POA: Diagnosis not present

## 2022-02-06 MED ORDER — POTASSIUM CHLORIDE CRYS ER 20 MEQ PO TBCR: 20.0000 meq | EXTENDED_RELEASE_TABLET | Freq: Three times a day (TID) | ORAL | 0 refills | Status: DC

## 2022-02-06 MED ORDER — MAGNESIUM OXIDE -MG SUPPLEMENT 400 (240 MG) MG PO TABS: 400.0000 mg | ORAL_TABLET | Freq: Every day | ORAL | 2 refills | Status: DC

## 2022-02-06 NOTE — Patient Instructions (Signed)
Medication Instructions:  DECREASE the Magnesium to once daily  POTASSIUM: take one 20 mEq tablets 3 times daily  *If you need a refill on your cardiac medications before your next appointment, please call your pharmacy*   Lab Work: None ordered If you have labs (blood work) drawn today and your tests are completely normal, you will receive your results only by: [place] (if you have MyChart) OR A paper copy in the mail If you have any lab test that is abnormal or we need to change your treatment, we will call you to review the results.   Testing/Procedures: None ordered   Follow-Up: At [facility], you and your health needs are our priority.  As part of our continuing mission to provide you with exceptional heart care, we have created designated Provider Care Teams.  These Care Teams include your primary Cardiologist (physician) and Advanced Practice Providers (APPs -  Physician Assistants and Nurse Practitioners) who all work together to provide you with the care you need, when you need it.  We recommend signing up for the patient portal called "MyChart".  Sign up information is provided on this After Visit Summary.  MyChart is used to connect with patients for Virtual Visits (Telemedicine).  Patients are able to view lab/test results, encounter notes, upcoming appointments, etc.  Non-urgent messages can be sent to your provider as well.   To learn more about what you can do with MyChart, go to [email or web address].    Your next appointment:   Follow up next available with Dr. Lovena Le in [place]

## 2022-02-06 NOTE — Progress Notes (Signed)
Cardiology Office Note:    Date:  02/06/2022   ID:  Mallory Sutton, DOB 10/03/1960, MRN [id number]  PCP:  Valentino Nose, [place] Providers Cardiologist:  Jenkins Rouge, MD     Referring MD: Valentino Nose, FNP   Chief Complaint  Patient presents with   Atrial Fibrillation    History of Present Illness:    Mallory Sutton is a 61 y.o. female with a hx of congenital heart disease (aortic coarctation repair at age 32, bicuspid aortic valve with mechanical valve replacement and root replacement 2016, with maze procedure and atrial clip), unspecified COPD, skin vasculitis, HTN who called after having a prolonged episode of rapid palpitations and heart rates as high as 180 bpm.    She called with these complaints a couple of days ago but today she is back in normal rhythm.  She is on chronic anticoagulation with warfarin for her mechanical aortic valve prosthesis and previous episodes of recurrent atrial flutter.  She has not had any focal neurological complaints or any bleeding issues.  There has been no interruption in anticoagulation.  She is generally done well on antiarrhythmic treatment with propafenone for a very long time.  Episodes of arrhythmia are usually much shorter.  This is the first time she had a spell lasting for more than 24 hours in a few years.  Last required a cardioversion in April 2021.  She is on chronic oxygen therapy since an acute respiratory illness in March 2023.  Currently on oxygen 2 L by nasal cannula.  She is seeing Dr. Erin Fulling.  Pulmonary function tests performed earlier this year showed forced vital capacity 54% of predicted and FEV1 51% of predicted without any change on bronchodilators.  It is felt that she has chronic obstructive lung disease although she is never been a smoker.  She has been exposed to secondhand smoke from both her parents and her husband and has used a Development worker, community for a period of time.  She does not have orthopnea or PND  or lower extremity edema.  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  She has had a previous biopsy that documented vasculitis but I do not know exactly what type.  ANA and rheumatoid factor are negative.  I do not see documentation of ANCA.  She reports that occasionally she developed small ulcerations of the skin that resolved quickly if she places nice porn and a small bandage on them.  She has not had fever chills or cellulitis.  Does not have lower extremity edema.  She last had an echocardiogram during her hospitalization in March 2023.  The gradients across the aortic valve prosthesis were higher, but the dimensionless index in the acceleration time were normal range suggesting that the gradients are higher due to increased cardiac output, not due to valve dysfunction.  She had a chest x-ray in September that suggested possible mild congestion.  Follow-up x-ray form 02/03/2022 does not show any signs of fluid overload.  The cardiac silhouette is normal in size.  Past Medical History:  Diagnosis Date   Aortic stenosis    ATRIAL ARRHYTHMIAS    Bicuspid aortic valve    CHF (congestive heart failure) (HCC)    COARCTATION OF AORTA    ENDOMETRIOSIS    Heart murmur    Paroxysmal atrial fibrillation (HCC)    Shortness of breath    THORACIC AORTIC ANEURYSM     Past Surgical History:  Procedure Laterality Date   CARDIOVERSION N/A 08/29/2019   Procedure: CARDIOVERSION;  Surgeon: Arnoldo Lenis, MD;  Location: [facility];  Service: Endoscopy;  Laterality: N/A;   coarctation repair and residual restenosis     KNEE SURGERY     TEE WITHOUT CARDIOVERSION N/A 07/12/2019   Procedure: TRANSESOPHAGEAL ECHOCARDIOGRAM (TEE);  Surgeon: Sueanne Margarita, MD;  Location: [facility] ENDOSCOPY;  Service: Cardiovascular;  Laterality: N/A;   TEE WITHOUT CARDIOVERSION N/A 08/29/2019   Procedure: TRANSESOPHAGEAL ECHOCARDIOGRAM (TEE) WITH PROPOFOL;  Surgeon: Arnoldo Lenis, MD;  Location: [facility];  Service: Endoscopy;  Laterality: N/A;    Current Medications: Current Meds  Medication Sig   albuterol (VENTOLIN HFA) 108 (90 Base) MCG/ACT inhaler Inhale 2 puffs into the lungs every 6 (six) hours as needed for wheezing or shortness of breath.   aspirin EC 81 MG tablet Take 81 mg by mouth daily.   atorvastatin (LIPITOR) 10 MG tablet Take 10 mg by mouth daily.   budesonide-formoterol (SYMBICORT) 160-4.5 MCG/ACT inhaler Inhale 2 puffs into the lungs 2 (two) times daily.   calcium-vitamin D (OSCAL WITH D) 500-5 MG-MCG tablet Take 1 tablet by mouth daily.   diazepam (VALIUM) 10 MG tablet Take 10 mg by mouth daily as needed for sleep.   feeding supplement (ENSURE ENLIVE / ENSURE PLUS) LIQD Take 237 mLs by mouth 2 (two) times daily between meals.   furosemide (LASIX) 40 MG tablet Take 80 mg by mouth daily.   gabapentin (NEURONTIN) 300 MG capsule Take 300-600 mg by mouth See admin instructions. Take 300 mg in the morning, 300 mg in the afternoon, and 600 mg at bedtime   HYDROcodone-acetaminophen (NORCO/VICODIN) 5-325 MG tablet Take 2 tablets by mouth 2 (two) times daily.   ipratropium-albuterol (DUONEB) 0.5-2.5 (3) MG/3ML SOLN SMARTSIG:3 Milliliter(s) Via Nebulizer Twice Daily PRN   metoprolol succinate (TOPROL-XL) 25 MG 24 hr tablet TAKE 1 TABLET BY MOUTH ONCE DAILY. (Patient taking differently: Take 25 mg by mouth daily.)   Multiple Vitamin (MULTI-VITAMINS) TABS Take 1 tablet by mouth daily.    ondansetron (ZOFRAN ODT) 4 MG disintegrating tablet Take 1 tablet (4 mg total) by mouth every 8 (eight) hours as needed for nausea or vomiting.   pantoprazole (PROTONIX) 40 MG tablet Take 1 tablet (40 mg total) by mouth 2 (two) times daily before a meal.   propafenone (RYTHMOL) 300 MG tablet TAKE 1 TABLET BY MOUTH THREE TIMES A DAY.   sertraline (ZOLOFT) 100 MG tablet Take 150 mg by mouth daily.   Tiotropium Bromide Monohydrate (SPIRIVA RESPIMAT) 2.5 MCG/ACT AERS Inhale 2  puffs into the lungs daily.   Turmeric 500 MG CAPS Take 500 mg by mouth 3 (three) times a week.   warfarin (COUMADIN) 1 MG tablet Take 3 tablets daily except 2 tablets on Sundays and Wednesdays   [DISCONTINUED] magnesium oxide (MAGOX 400) 400 (240 Mg) MG tablet Take 1 tablet (400 mg total) by mouth every other day.   [DISCONTINUED] potassium chloride SA (KLOR-CON M) 20 MEQ tablet Take 2 tablets (40 mEq total) by mouth daily.   [DISCONTINUED] predniSONE (DELTASONE) 10 MG tablet Take by mouth.     Allergies:   Amiodarone and Penicillins   Social History   Socioeconomic History   Marital status: Married    Spouse name: Not on file   Number of children: Not on file   Years of education: Not on file   Highest education level: Not on file  Occupational History   Not on file  Tobacco Use   Smoking status: Never    Passive exposure: Past   Smokeless tobacco: Never  Vaping Use   Vaping Use: Never used  Substance and Sexual Activity   Alcohol use: Yes    Alcohol/week: 0.0 standard drinks of alcohol    Comment: 3 beers 3 nights a week   Drug use: No   Sexual activity: Never  Other Topics Concern   Not on file  Social History Narrative   Not on file   Social Determinants of Health   Financial Resource Strain: Not on file  Food Insecurity: Not on file  Transportation Needs: Not on file  Physical Activity: Not on file  Stress: Not on file  Social Connections: Not on file     Family History: The patient's family history includes Drug abuse in her brother; Healthy in her daughter and son; Mental illness in her mother; Suicidality in her mother.  ROS:   Please see the history of present illness.     All other systems reviewed and are negative.  EKGs/Labs/Other Studies Reviewed:    The following studies were reviewed today: Echocardiogram 07/20/2021   1. Left ventricular ejection fraction, by estimation, is 60 to 65%. The  left ventricle has normal function. The left  ventricle has no regional  wall motion abnormalities. There is moderate left ventricular hypertrophy.  Left ventricular diastolic  parameters are indeterminate.   2. Right ventricular systolic function is normal. The right ventricular  size is normal. There is mildly elevated pulmonary artery systolic  pressure.   3. Left atrial size was moderately dilated.   4. Right atrial size was mildly dilated.   5. The mitral valve is grossly normal. Mild mitral valve regurgitation.   6. Status post Wheat and hemi-arch procedure a 50m On-X mechanical valve  07/25/2014 for hx of BAV. Aortic valve mean gradient 29 mmHg, AV max 3.5  m/s, DI 0.44. May have trivial paravalvular leak. Well seated.   7. The inferior vena cava is normal in size with greater than 50%  respiratory variability, suggesting right atrial pressure of 3 mmHg.   Conclusion(s)/Recommendation(s): Prosthetic valve gradent has increased from 2019. DI/AT do not suggest valve stenosis.   EKG:  EKG is ordered today.  The ekg ordered today demonstrates mild sinus bradycardia, incomplete right bundle branch block and left anterior fascicular block, T wave inversion leads I and aVL, borderline QTc 462 ms).  Recent Labs: 07/20/2021: B Natriuretic Peptide 437.0 11/09/2021: ALT 20; Hemoglobin 9.6; Platelets 243 02/03/2022: BUN 16; Creatinine, Ser 0.93; Magnesium 2.7; Potassium 3.4; Sodium 140  Recent Lipid Panel    Component Value Date/Time   CHOL 144 01/23/2015 0913   TRIG 87 01/23/2015 0913   HDL 44 01/23/2015 0913   CHOLHDL 3.3 01/23/2015 0913   VLDL 17 01/23/2015 0913   LDLCALC 83 01/23/2015 0913     Risk Assessment/Calculations:    CHA2DS2-VASc Score =     This indicates a  % annual risk of stroke. The patient's score is based upon:     CHA2DS2-VASc  is not relevant due to the presence of mechanical aortic valve.            Physical Exam:    VS:  BP 136/60   Pulse (!) 54   Ht '5\' 4"'$  (1.626 m)   Wt 103 lb 12.8 oz (47.1  kg)   LMP 07/16/2013   SpO2 100%   BMI 17.82 kg/m     Wt Readings from Last 3  Encounters:  02/06/22 103 lb 12.8 oz (47.1 kg)  01/26/22 102 lb (46.3 kg)  11/08/21 98 lb 9.6 oz (44.7 kg)     GEN: Very lean, underweight.  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 Atrial flutter, unspecified type ([place])   2. Status post ligation of left atrial appendage   3. Hx of aortic valve replacement, mechanical   4. H/O aortic root repair   5. H/O aortic coarctation repair   6. LAFB (left anterior fascicular block)   7. Hypokalemia    PLAN:    In order of problems listed above:  Paroxysmal atrial flutter/atrial fibrillation: Had a lengthy symptomatic episode earlier this week but it has resolved spontaneously.  Chronically on propafenone 300 mg 3 times daily which successfully worked for her for a couple of years.  I do not think there is a reason to change her medications at this time, but it is wise for her to have a follow-up visit with Dr. Lovena Le.  If the arrhythmia worsens and prevalence or duration, options are switching to dofetilide (which would require a 72-hour hospitalization) or a "touchup" endocardial ablation to optimize her previous surgical maze procedure.  Previously intolerant to amiodarone.  It is  important to avoid electrolyte abnormalities and will increase the dose of potassium that she takes.  On the other hand we will decrease the magnesium dose since she has elevated magnesium levels.  Continue uninterrupted anticoagulation with warfarin. History of atrial appendage clip: TEE performed in March 2021 described a left atrial appendage thrombus which was resolved on TEE in April.  An atricure clip is clearly seen on chest x-ray. AVR: The aortic valve gradients were slightly increased at the echocardiogram in March, but the dimensionless index was unchanged from previous baseline and the acceleration time was normal, suggesting the increased gradients were due to increased cardiac output in the setting of acute illness.  She has a relatively small valve, 19 mm On- X.  She is therapeutically anticoagulated.  INR a few days ago was 3.5.  Note that in March when she was hospitalized that the INR was actually supratherapeutic at 4.0-5.0 making it unlikely that she had valve thrombosis.  Repeat echocardiogram at 1 year intervals is indicated to monitor the gradients.  Aware of the need for endocarditis prevention History of aortic root repair (bicuspid aortic valve) and remote aortic coarctation repair in childhood LAFB: The ECG abnormalities are chronic and may be attributed to antiarrhythmic therapy (slight QT prolongation). Chronic respiratory failure on oxygen: She has an appointment with Dr. Erin Fulling on December 6.  Abnormal pulmonary function tests, but surprisingly persistent toxemia after her acute illness in March 2023 Vasculitis: Very prominent livedo on physical exam today.  I have no terms of comparison, but she believes it is about the same as usual.  Does not know the exact type of vasculitis although a biopsy was performed.  I could not find a record of the biopsy.  Would be interesting to evaluate if there is any connection with her lung problems. Dr. Kyla Balzarine note states that she was  previously seen by rheumatology Dr. Benjamine Mola and ANCA, cryoglobulin and Cardiolite pending were all negative. Hypokalemia: Increase the total potassium chloride supplement to 60 mEq daily.  Decrease magnesium oxide to 400 mg daily for high magnesium levels.           Medication Adjustments/Labs and Tests Ordered: Current medicines are reviewed at length with the patient today.  Concerns regarding medicines are outlined above.  Orders Placed This Encounter  Procedures   EKG 12-Lead   Meds ordered this encounter  Medications   magnesium oxide (MAGOX 400) 400 (240 Mg) MG tablet    Sig: Take 1 tablet (400 mg total) by mouth daily.    Dispense:  30 tablet    Refill:  2   potassium chloride SA (KLOR-CON M) 20 MEQ tablet    Sig: Take 1 tablet (20 mEq total) by mouth 3 (three) times daily.    Dispense:  90 tablet    Refill:  0    Patient Instructions  Medication Instructions:  DECREASE the Magnesium to once daily  POTASSIUM: take one 20 mEq tablets 3 times daily  *If you need a refill on your cardiac medications before your next appointment, please call your pharmacy*   Lab Work: None ordered If you have labs (blood work) drawn today and your tests are completely normal, you will receive your results only by: [place] (if you have MyChart) OR A paper copy in the mail If you have any lab test that is abnormal or we need to change your treatment, we will call you to review the results.   Testing/Procedures: None ordered   Follow-Up: At [facility], you and your health needs are our priority.  As part of our continuing mission to provide you with exceptional heart care, we have created designated Provider Care Teams.  These Care Teams include your primary Cardiologist (physician) and Advanced Practice Providers (APPs -  Physician Assistants and Nurse Practitioners) who all work together to provide you with the care you need, when you need it.  We recommend signing  up for the patient portal called "MyChart".  Sign up information is provided on this After Visit Summary.  MyChart is used to connect with patients for Virtual Visits (Telemedicine).  Patients are able to view lab/test results, encounter notes, upcoming appointments, etc.  Non-urgent messages can be sent to your provider as well.   To learn more about what you can do with MyChart, go to [email or web address].    Your next appointment:   Follow up next available with Dr. Lovena Le in [place], Sanda Klein, MD  02/06/2022 7:05 PM    [place]

## 2022-02-11 DIAGNOSIS — J441 Chronic obstructive pulmonary disease with (acute) exacerbation: Secondary | ICD-10-CM | POA: Diagnosis not present

## 2022-02-13 ENCOUNTER — Encounter: Payer: Self-pay | Admitting: Cardiovascular Disease

## 2022-02-13 DIAGNOSIS — R944 Abnormal results of kidney function studies: Secondary | ICD-10-CM | POA: Diagnosis not present

## 2022-02-13 DIAGNOSIS — I482 Chronic atrial fibrillation, unspecified: Secondary | ICD-10-CM | POA: Diagnosis not present

## 2022-02-13 DIAGNOSIS — J449 Chronic obstructive pulmonary disease, unspecified: Secondary | ICD-10-CM | POA: Diagnosis not present

## 2022-02-13 DIAGNOSIS — E876 Hypokalemia: Secondary | ICD-10-CM | POA: Diagnosis not present

## 2022-02-13 DIAGNOSIS — L729 Follicular cyst of the skin and subcutaneous tissue, unspecified: Secondary | ICD-10-CM | POA: Diagnosis not present

## 2022-02-13 DIAGNOSIS — L959 Vasculitis limited to the skin, unspecified: Secondary | ICD-10-CM | POA: Diagnosis not present

## 2022-02-13 DIAGNOSIS — Q251 Coarctation of aorta: Secondary | ICD-10-CM | POA: Diagnosis not present

## 2022-02-13 DIAGNOSIS — R252 Cramp and spasm: Secondary | ICD-10-CM | POA: Diagnosis not present

## 2022-02-13 DIAGNOSIS — I10 Essential (primary) hypertension: Secondary | ICD-10-CM | POA: Diagnosis not present

## 2022-02-13 DIAGNOSIS — J441 Chronic obstructive pulmonary disease with (acute) exacerbation: Secondary | ICD-10-CM | POA: Diagnosis not present

## 2022-02-13 DIAGNOSIS — E785 Hyperlipidemia, unspecified: Secondary | ICD-10-CM | POA: Diagnosis not present

## 2022-02-16 ENCOUNTER — Telehealth: Payer: Self-pay

## 2022-02-16 MED ORDER — PROPAFENONE HCL 300 MG PO TABS: 150.0000 mg | ORAL_TABLET | Freq: Three times a day (TID) | ORAL | 1 refills | Status: DC

## 2022-02-16 NOTE — Telephone Encounter (Signed)
Mallory Hector, MD  [name] 2 days ago    Cut your propofenone back to 150 mg three times per day 1/2 pill  Office will call to have you come in for ECG       I sent patient a  MyChart message, and am awaiting response   Nurse apt made for 2 weeks, 03/02/22 at 4 pm

## 2022-02-21 DIAGNOSIS — J441 Chronic obstructive pulmonary disease with (acute) exacerbation: Secondary | ICD-10-CM | POA: Diagnosis not present

## 2022-02-21 DIAGNOSIS — J9601 Acute respiratory failure with hypoxia: Secondary | ICD-10-CM | POA: Diagnosis not present

## 2022-03-02 ENCOUNTER — Ambulatory Visit: Payer: 59 | Attending: Cardiovascular Disease | Admitting: *Deleted

## 2022-03-02 ENCOUNTER — Ambulatory Visit: Payer: 59

## 2022-03-02 ENCOUNTER — Telehealth: Payer: Self-pay | Admitting: Internal Medicine

## 2022-03-02 DIAGNOSIS — Z5181 Encounter for therapeutic drug level monitoring: Secondary | ICD-10-CM

## 2022-03-02 DIAGNOSIS — Q231 Congenital insufficiency of aortic valve: Secondary | ICD-10-CM

## 2022-03-02 DIAGNOSIS — I4891 Unspecified atrial fibrillation: Secondary | ICD-10-CM

## 2022-03-02 DIAGNOSIS — I4892 Unspecified atrial flutter: Secondary | ICD-10-CM

## 2022-03-02 DIAGNOSIS — Z952 Presence of prosthetic heart valve: Secondary | ICD-10-CM

## 2022-03-02 LAB — POCT INR: INR: 3 (ref 2.0–3.0)

## 2022-03-02 MED ORDER — WARFARIN SODIUM 1 MG PO TABS: ORAL_TABLET | ORAL | 5 refills | Status: DC

## 2022-03-02 NOTE — Telephone Encounter (Signed)
Patient went to [facility] instead of the heart care office and missed her nurse visit scheduled for today at 4:00 pm. She is requesting a callback to reschedule.

## 2022-03-02 NOTE — Patient Instructions (Signed)
Continue warfarin 3 tablets daily except 2 tablets on Sundays and Wednesdays Recheck in 3 week Continue Ensure 2 daily per MD

## 2022-03-03 NOTE — Telephone Encounter (Signed)
Returned pt call. No answer. Left msg to call back.  

## 2022-03-03 NOTE — Telephone Encounter (Signed)
Pt agreeable to 11/1 at 3:45 pm in the [place] office for Nurse Visit- EKG.

## 2022-03-03 NOTE — Telephone Encounter (Signed)
Patient is returning call to reschedule nurse visit.

## 2022-03-04 ENCOUNTER — Ambulatory Visit: Payer: 59 | Attending: Cardiology | Admitting: *Deleted

## 2022-03-04 DIAGNOSIS — I480 Paroxysmal atrial fibrillation: Secondary | ICD-10-CM | POA: Diagnosis not present

## 2022-03-04 NOTE — Addendum Note (Signed)
Addended by: Levonne Hubert on: 03/04/2022 03:52 PM   Modules accepted: Level of Service

## 2022-03-04 NOTE — Progress Notes (Signed)
Pt in office for EKG. States that she is feeling fine.

## 2022-03-10 DIAGNOSIS — J449 Chronic obstructive pulmonary disease, unspecified: Secondary | ICD-10-CM | POA: Diagnosis not present

## 2022-03-17 ENCOUNTER — Encounter: Payer: Self-pay | Admitting: Internal Medicine

## 2022-03-17 ENCOUNTER — Ambulatory Visit: Payer: 59 | Attending: Internal Medicine | Admitting: Internal Medicine

## 2022-03-17 VITALS — BP 138/62 | HR 73 | Ht 64.0 in | Wt 101.8 lb

## 2022-03-17 DIAGNOSIS — E876 Hypokalemia: Secondary | ICD-10-CM

## 2022-03-17 DIAGNOSIS — I272 Pulmonary hypertension, unspecified: Secondary | ICD-10-CM | POA: Diagnosis not present

## 2022-03-17 NOTE — Patient Instructions (Addendum)
Medication Instructions:   If your heart goes out of rhythm take an extra Metoprolol. If your heart is out of rhythm for 3 hours take an extra 1/2 of Rythmol.   *If you need a refill on your cardiac medications before your next appointment, please call your pharmacy*   Lab Work: Your physician recommends that you return for lab work in: Today     If you have labs (blood work) drawn today and your tests are completely normal, you will receive your results only by: MyChart Message (if you have MyChart) OR A paper copy in the mail If you have any lab test that is abnormal or we need to change your treatment, we will call you to review the results.   Testing/Procedures: Your physician has requested that you have an echocardiogram. Echocardiography is a painless test that uses sound waves to create images of your heart. It provides your doctor with information about the size and shape of your heart and how well your heart's chambers and valves are working. This procedure takes approximately one hour. There are no restrictions for this procedure. Please do NOT wear cologne, perfume, aftershave, or lotions (deodorant is allowed). Please arrive 15 minutes prior to your appointment time.    Follow-Up: At [facility], you and your health needs are our priority.  As part of our continuing mission to provide you with exceptional heart care, we have created designated Provider Care Teams.  These Care Teams include your primary Cardiologist (physician) and Advanced Practice Providers (APPs -  Physician Assistants and Nurse Practitioners) who all work together to provide you with the care you need, when you need it.  We recommend signing up for the patient portal called "MyChart".  Sign up information is provided on this After Visit Summary.  MyChart is used to connect with patients for Virtual Visits (Telemedicine).  Patients are able to view lab/test results, encounter notes, upcoming  appointments, etc.  Non-urgent messages can be sent to your provider as well.   To learn more about what you can do with MyChart, go to [email or web address].    Your next appointment:   6 month(s)  The format for your next appointment:   In Person  Provider:   Cristopher Peru, MD    Other Instructions Thank you for choosing [facility]!    Important Information About Sugar

## 2022-03-17 NOTE — Progress Notes (Signed)
HPI [name] returns today for followup after a 2 year absence from our clinic. 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 She has had PAF and been treated with propafenone. 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 She was prescribed methotrexate but refused to take it as she was concerned that it would cause her to get lymphoma. She has a strong family h/o CA. She has severe leg and hand pain. She has been on steroids but these were stopped. Her atrial fib and flutter have been mostly quiet on rhythmol. Her dose of beta blocker and propafenone have been cut in half. 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 Her DLCO is less than 50%.   Allergies  Allergen Reactions   Amiodarone Nausea Only   Penicillins Hives    Has patient had a PCN reaction causing immediate rash, facial/tongue/throat swelling, SOB or lightheadedness with hypotension: YES Has patient had a PCN reaction causing severe rash involving mucus membranes or skin necrosis: NO Has patient had a PCN reaction that required hospitalization NO Has patient had a PCN reaction occurring within the last 10 years: NO If all of the above answers are "NO", then may proceed with Cephalosporin use.      Current Outpatient Medications  Medication Sig Dispense Refill   albuterol (VENTOLIN HFA) 108 (90 Base) MCG/ACT inhaler Inhale 2 puffs into the lungs every 6 (six) hours as needed for wheezing or shortness of breath. 8 g 6   aspirin EC 81 MG tablet Take 81 mg by mouth daily.     atorvastatin (LIPITOR) 10 MG tablet Take 10 mg by mouth daily.     budesonide-formoterol (SYMBICORT) 160-4.5 MCG/ACT inhaler Inhale 2 puffs into the lungs 2 (two) times daily. 1 each 6   calcium-vitamin D (OSCAL WITH D) 500-5 MG-MCG tablet Take 1 tablet by mouth daily. 30 tablet 2   diazepam  (VALIUM) 10 MG tablet Take 10 mg by mouth daily as needed for sleep.     feeding supplement (ENSURE ENLIVE / ENSURE PLUS) LIQD Take 237 mLs by mouth 2 (two) times daily between meals.     furosemide (LASIX) 40 MG tablet Take 80 mg by mouth daily.     gabapentin (NEURONTIN) 300 MG capsule Take 300-600 mg by mouth See admin instructions. Take 300 mg in the morning, 300 mg in the afternoon, and 600 mg at bedtime     HYDROcodone-acetaminophen (NORCO/VICODIN) 5-325 MG tablet Take 2 tablets by mouth 2 (two) times daily.     ipratropium-albuterol (DUONEB) 0.5-2.5 (3) MG/3ML SOLN SMARTSIG:3 Milliliter(s) Via Nebulizer Twice Daily PRN     magnesium oxide (MAGOX 400) 400 (240 Mg) MG tablet Take 1 tablet (400 mg total) by mouth daily. 30 tablet 2   metoprolol succinate (TOPROL-XL) 25 MG 24 hr tablet TAKE 1 TABLET BY MOUTH ONCE DAILY. (Patient taking differently: Take 25 mg by mouth daily.) 90 tablet 3   Multiple Vitamin (MULTI-VITAMINS) TABS Take 1 tablet by mouth daily.      ondansetron (ZOFRAN ODT) 4 MG disintegrating tablet Take 1 tablet (4 mg total) by mouth every 8 (eight) hours as needed for nausea or vomiting. 20 tablet 0   pantoprazole (PROTONIX) 40 MG tablet Take 1 tablet (40 mg total) by mouth 2 (two) times daily before a  meal. 60 tablet 1   potassium chloride SA (KLOR-CON M) 20 MEQ tablet Take 1 tablet (20 mEq total) by mouth 3 (three) times daily. 90 tablet 0   propafenone (RYTHMOL) 300 MG tablet Take 0.5 tablets (150 mg total) by mouth every 8 (eight) hours. 135 tablet 1   sertraline (ZOLOFT) 100 MG tablet Take 150 mg by mouth daily.     Tiotropium Bromide Monohydrate (SPIRIVA RESPIMAT) 2.5 MCG/ACT AERS Inhale 2 puffs into the lungs daily. 4 g 11   Turmeric 500 MG CAPS Take 500 mg by mouth 3 (three) times a week.     warfarin (COUMADIN) 1 MG tablet Take 3 tablets daily except 2 tablets on Sundays and Wednesdays 80 tablet 5   No current facility-administered medications for this visit.      Past Medical History:  Diagnosis Date   Aortic stenosis    ATRIAL ARRHYTHMIAS    Bicuspid aortic valve    CHF (congestive heart failure) (HCC)    COARCTATION OF AORTA    ENDOMETRIOSIS    Heart murmur    Paroxysmal atrial fibrillation (HCC)    Shortness of breath    THORACIC AORTIC ANEURYSM     ROS:   All systems reviewed and negative except as noted in the HPI.   Past Surgical History:  Procedure Laterality Date   CARDIOVERSION N/A 08/29/2019   Procedure: CARDIOVERSION;  Surgeon: Arnoldo Lenis, MD;  Location: [facility];  Service: Endoscopy;  Laterality: N/A;   coarctation repair and residual restenosis     KNEE SURGERY     TEE WITHOUT CARDIOVERSION N/A 07/12/2019   Procedure: TRANSESOPHAGEAL ECHOCARDIOGRAM (TEE);  Surgeon: Sueanne Margarita, MD;  Location: [facility];  Service: Cardiovascular;  Laterality: N/A;   TEE WITHOUT CARDIOVERSION N/A 08/29/2019   Procedure: TRANSESOPHAGEAL ECHOCARDIOGRAM (TEE) WITH PROPOFOL;  Surgeon: Arnoldo Lenis, MD;  Location: [facility];  Service: Endoscopy;  Laterality: N/A;     Family History  Problem Relation Age of Onset   Suicidality Mother    Mental illness Mother    Drug abuse Brother    Healthy Daughter    Healthy Son      Social History   Socioeconomic History   Marital status: Married    Spouse name: Not on file   Number of children: Not on file   Years of education: Not on file   Highest education level: Not on file  Occupational History   Not on file  Tobacco Use   Smoking status: Never    Passive exposure: Past   Smokeless tobacco: Never  Vaping Use   Vaping Use: Never used  Substance and Sexual Activity   Alcohol use: Yes    Alcohol/week: 0.0 standard drinks of alcohol    Comment: 3 beers 3 nights a week   Drug use: No   Sexual activity: Never  Other Topics Concern   Not on file  Social History Narrative   Not on file   Social Determinants of Health   Financial Resource Strain: Not on file   Food Insecurity: Not on file  Transportation Needs: Not on file  Physical Activity: Not on file  Stress: Not on file  Social Connections: Not on file  Intimate Partner Violence: Not on file     BP 138/62   Pulse 73   Ht '5\' 4"'$  (1.626 m)   Wt 101 lb 12.8 oz (46.2 kg)   LMP 07/16/2013   SpO2 100%   BMI 17.47 kg/m  Physical Exam:  Well appearing NAD HEENT: Unremarkable Neck:  No JVD, no thyromegally Lymphatics:  No adenopathy Back:  No CVA tenderness Lungs:  scattered rales and rhonchi. HEART:  Regular rate rhythm, no murmurs, no rubs, no clicks; loud S2 c/w pulmonary htn.  Abd:  soft, positive bowel sounds, no organomegally, no rebound, no guarding Ext:  2 plus pulses, no edema, no cyanosis, no clubbing Skin:  No rashes no nodules Neuro:  CN II through XII intact, motor grossly intact   Assess/Plan:  1. Reticular rash - she has been found to have vasculitis and is in pain. I asked her to consider methotrexate or perhaps another immunologic agent. 2. PAF/flutter - she is s/p MAZE and is maintaining NSR on propafenone. Her QRS from 3 months ago demonstrates a relatively narrow QRS. 3. Coarct - note that her residual coarct was not repaired. She will need anti-biotic prophylaxis. 4. Chronic diastolic heart failure - her symptoms are class 3.  5. Hand cramps - this is her biggest complaint and I have asked her to get a potassium level and to take extra potassium containing foods. 6. Oxygen dependent lung disease - followup with her pulmonary MD is pending. If vasculitis is contibuting, she should really be treated with an anti-inflammatory agent.    Carleene Overlie Amato Sevillano,MD

## 2022-03-18 DIAGNOSIS — E876 Hypokalemia: Secondary | ICD-10-CM | POA: Diagnosis not present

## 2022-03-18 LAB — BASIC METABOLIC PANEL
BUN: 12 mg/dL (ref 7–25)
CO2: 28 mmol/L (ref 20–32)
Calcium: 9.8 mg/dL (ref 8.6–10.4)
Chloride: 100 mmol/L (ref 98–110)
Creat: 0.78 mg/dL (ref 0.50–1.05)
Glucose, Bld: 77 mg/dL (ref 65–99)
Potassium: 3.7 mmol/L (ref 3.5–5.3)
Sodium: 140 mmol/L (ref 135–146)

## 2022-03-19 ENCOUNTER — Ambulatory Visit: Payer: 59 | Attending: Internal Medicine

## 2022-03-19 DIAGNOSIS — I517 Cardiomegaly: Secondary | ICD-10-CM | POA: Diagnosis not present

## 2022-03-19 DIAGNOSIS — I081 Rheumatic disorders of both mitral and tricuspid valves: Secondary | ICD-10-CM | POA: Diagnosis not present

## 2022-03-19 DIAGNOSIS — I5030 Unspecified diastolic (congestive) heart failure: Secondary | ICD-10-CM

## 2022-03-19 DIAGNOSIS — I272 Pulmonary hypertension, unspecified: Secondary | ICD-10-CM

## 2022-03-19 LAB — ECHOCARDIOGRAM COMPLETE
AR max vel: 0.94 cm2
AV Area VTI: 1.03 cm2
AV Area mean vel: 0.99 cm2
AV Mean grad: 28 mmHg
AV Peak grad: 35.2 mmHg
Ao pk vel: 2.97 m/s
Area-P 1/2: 2.74 cm2
Calc EF: 75.8 %
MV M vel: 4.81 m/s
MV Peak grad: 92.7 mmHg
P 1/2 time: 325 msec
S' Lateral: 3 cm
Single Plane A2C EF: 80.1 %
Single Plane A4C EF: 73.1 %

## 2022-03-23 ENCOUNTER — Encounter: Payer: Self-pay | Admitting: Pulmonary Disease

## 2022-03-23 ENCOUNTER — Telehealth: Payer: Self-pay

## 2022-03-23 ENCOUNTER — Encounter: Payer: Self-pay | Admitting: Cardiovascular Disease

## 2022-03-23 NOTE — Telephone Encounter (Signed)
-----   Message from Evans Lance, MD sent at 03/21/2022  9:42 PM EST ----- Potassium is improved

## 2022-03-23 NOTE — Telephone Encounter (Signed)
Dr. Erin Fulling please advise on the following My Chart message:   SHANESHA BEDNARZ [facility] (supporting Freddi Starr, MD)3 hours ago (12:50 PM)    I'm sorry Tuesday is when I saw Dr. Lovena Le and had blood work on Wednesday and and echocardiogram on Thursday.   Also Dr. Lovena Le is afraid the vasculitis is slowly killing me so Im making an appointment with the rheumatologist to go on the methotrexate even though I'm afraid of the cancer risk.   Thank you Silver Huguenin Duval  P [facility] (supporting Freddi Starr, MD)4 hours ago (12:18 PM)    Hi Dr. Erin Fulling, Please read the notes from my visit with Dr. Crissie Sickles from last Wednesday the 18th.  He believes that my lung issue is vasculitis that has moved there.  He said I sounded terrible.  I see you on 12/6 but please let me know what you think and if I should do anything differently.  He also upped my oxygen to 3 instead of 2 and it's helping.   Thank you so much! Mintie    Thank you

## 2022-03-23 NOTE — Telephone Encounter (Signed)
Pt called to report Potassium / BMET results.    Per Dr. Lovena Le:    Potassium is improved   Pt was aware of lab result. Pt wanted her providers to know that she has been diagnosed with Sleep Apnea, and that she is still having the vasculitis in her hands.  Pt has follow up appointments scheduled with Dr. Erin Fulling, and her PCP.  Pt told I will notate her concerns, for provider review.

## 2022-03-30 DIAGNOSIS — G4700 Insomnia, unspecified: Secondary | ICD-10-CM | POA: Diagnosis not present

## 2022-03-30 DIAGNOSIS — R059 Cough, unspecified: Secondary | ICD-10-CM | POA: Diagnosis not present

## 2022-03-30 DIAGNOSIS — M79642 Pain in left hand: Secondary | ICD-10-CM | POA: Diagnosis not present

## 2022-03-30 DIAGNOSIS — J449 Chronic obstructive pulmonary disease, unspecified: Secondary | ICD-10-CM | POA: Diagnosis not present

## 2022-03-30 DIAGNOSIS — R519 Headache, unspecified: Secondary | ICD-10-CM | POA: Diagnosis not present

## 2022-03-30 DIAGNOSIS — M79641 Pain in right hand: Secondary | ICD-10-CM | POA: Diagnosis not present

## 2022-03-30 DIAGNOSIS — M179 Osteoarthritis of knee, unspecified: Secondary | ICD-10-CM | POA: Diagnosis not present

## 2022-03-30 DIAGNOSIS — R42 Dizziness and giddiness: Secondary | ICD-10-CM | POA: Diagnosis not present

## 2022-03-30 DIAGNOSIS — G4730 Sleep apnea, unspecified: Secondary | ICD-10-CM | POA: Diagnosis not present

## 2022-03-30 DIAGNOSIS — I480 Paroxysmal atrial fibrillation: Secondary | ICD-10-CM | POA: Diagnosis not present

## 2022-03-30 DIAGNOSIS — Z9981 Dependence on supplemental oxygen: Secondary | ICD-10-CM | POA: Diagnosis not present

## 2022-03-30 DIAGNOSIS — R0789 Other chest pain: Secondary | ICD-10-CM | POA: Diagnosis not present

## 2022-03-31 ENCOUNTER — Ambulatory Visit: Payer: 59 | Attending: Cardiovascular Disease | Admitting: *Deleted

## 2022-03-31 DIAGNOSIS — Q231 Congenital insufficiency of aortic valve: Secondary | ICD-10-CM

## 2022-03-31 DIAGNOSIS — I4892 Unspecified atrial flutter: Secondary | ICD-10-CM

## 2022-03-31 DIAGNOSIS — Z5181 Encounter for therapeutic drug level monitoring: Secondary | ICD-10-CM

## 2022-03-31 LAB — POCT INR: INR: 1.4 — AB (ref 2.0–3.0)

## 2022-03-31 NOTE — Patient Instructions (Signed)
Take warfarin '5mg'$  tonight, '3mg'$  tomorrow night then resume 3 tablets daily except 2 tablets on Sundays and Wednesdays Recheck in 2 weeks Continue Ensure 2 daily per MD

## 2022-04-02 ENCOUNTER — Telehealth: Payer: Self-pay

## 2022-04-02 NOTE — Telephone Encounter (Signed)
-----  Message from Freddi Starr, MD sent at 04/01/2022  9:02 PM EST ----- Thanks for the update Pete. I will follow up labs when I see her in early December and consider a skin biopsy. She had negative vasculitis workup by Rheumatology last year.   Billyjack Trompeter, can you please place order for her to get HRCT Chest prior to our visit on 12/6 and please let patient know of these plans.  Jon  ----- Message ----- From: Josue Hector, MD Sent: 04/01/2022   2:44 PM EST To: Bernita Raisin, RN; Freddi Starr, MD  Tye [place]- she has seen Dr Erin Fulling in pulmonary before I don't think she has just COPD never smoked may have vasulitis as she has had elevated ESR in past and vasculitic rash Order ESR, CRP , RF, ds DNA, ANA and non contrast chest CT. ? She has a f/u with pulmonary already

## 2022-04-02 NOTE — Telephone Encounter (Signed)
Called patient but she did not answer. Left message for her to call us back.  

## 2022-04-08 ENCOUNTER — Ambulatory Visit: Payer: 59 | Admitting: Pulmonary Disease

## 2022-04-08 ENCOUNTER — Encounter: Payer: Self-pay | Admitting: Pulmonary Disease

## 2022-04-08 VITALS — BP 110/68 | HR 67 | Temp 98.3°F | Ht 64.0 in | Wt 102.2 lb

## 2022-04-08 DIAGNOSIS — J9611 Chronic respiratory failure with hypoxia: Secondary | ICD-10-CM

## 2022-04-08 DIAGNOSIS — J449 Chronic obstructive pulmonary disease, unspecified: Secondary | ICD-10-CM

## 2022-04-08 DIAGNOSIS — I776 Arteritis, unspecified: Secondary | ICD-10-CM | POA: Diagnosis not present

## 2022-04-08 LAB — COMPREHENSIVE METABOLIC PANEL
ALT: 12 U/L (ref 0–35)
AST: 21 U/L (ref 0–37)
Albumin: 3.9 g/dL (ref 3.5–5.2)
Alkaline Phosphatase: 84 U/L (ref 39–117)
BUN: 11 mg/dL (ref 6–23)
CO2: 35 mEq/L — ABNORMAL HIGH (ref 19–32)
Calcium: 9 mg/dL (ref 8.4–10.5)
Chloride: 97 mEq/L (ref 96–112)
Creatinine, Ser: 0.79 mg/dL (ref 0.40–1.20)
GFR: 81.51 mL/min (ref >60.00)
Glucose, Bld: 74 mg/dL (ref 70–99)
Potassium: 3 mEq/L — ABNORMAL LOW (ref 3.5–5.1)
Sodium: 140 mEq/L (ref 135–145)
Total Bilirubin: 0.5 mg/dL (ref 0.2–1.2)
Total Protein: 6.8 g/dL (ref 6.0–8.3)

## 2022-04-08 NOTE — Progress Notes (Signed)
Synopsis: Referred in April 2023 for COPD and hospital follow  Subjective:   PATIENT ID: Mallory Sutton GENDER: female DOB: 23-May-1960, MRN: [id number]  HPI  Chief Complaint  Patient presents with   Follow-up   Mallory Sutton is a 61 year old woman, never smoker with history of congenital heart disease with coarctation repair at age 58, bicuspid AV s/p replacement and aortic arch repair in 2016, PAF s/p MAZE and left atrial appendage occlusion and vasculitis of lower extremities in 2019 who returns to pulmonary clinic for COPD.   She reports progressive shortness of breath and is now using 3L of supplemental oxygen. She currently reports having a cold infection. She was diagnosed with sleep apnea by her primary care team. She was started on steroid taper 11/20 for her shortness of breath and skin rashes. She first noted a skin rash on her lower extremities which she had biopsies in 2019 by dermatology which showed evidence of vasculitis with differential of Henoch-Schonlein purpura, urticarial vasculitits, cryoglobulinemia, serum sickness and connective tissue disease. She reports discussing methotrexate treatment at that time but did not want to start it due to concerns of cancer.   OV 10/06/21 She was started on spiriva 2.71mg 2 puffs daily at last visit in addition to her symbicort 160-4.511m 2 puffs twice daily withimprovement of her respiratory symptoms. She has tried breztri per her PCP and prefers this inhaler over trelegy ellipta.   She continues to require continuous oxygen at 2L. PFTs today show severe obstruction with moderate diffusion defect.   Initial OV 08/05/21 Patient was [facility] to [facility]15 to 3/21 for acute hypoxemic respiratory failure with concern for COPD exacerbation.  Patient does not carry history of COPD and is a never smoker.  She reports significant secondhand smoke exposure in childhood from her parents and throughout her life from her husband and son.  She also  reports they have had a wood stove as their home heating source for many years.  Prior to last fall she only noticed shortness of breath when she was in atrial fibrillation.  She denies any history of cough or wheezing.  She reports her cough and wheezing during the hospital ideation is much improved.  She has increased cough when she lays down.  She denies any ongoing fevers, chills or sweats.  She is currently using Symbicort 1 puff in the morning and 1 puff at night.  She has an as needed Combivent inhaler.  She received nebulizer treatments in the hospital which she prefers over the inhaler therapy.  She reports family history of lung cancer in her grandfather.  Past Medical History:  Diagnosis Date   Aortic stenosis    ATRIAL ARRHYTHMIAS    Bicuspid aortic valve    CHF (congestive heart failure) (HCC)    COARCTATION OF AORTA    ENDOMETRIOSIS    Heart murmur    Paroxysmal atrial fibrillation (HCC)    Shortness of breath    THORACIC AORTIC ANEURYSM      Family History  Problem Relation Age of Onset   Suicidality Mother    Mental illness Mother    Drug abuse Brother    Healthy Daughter    Healthy Son      Social History   Socioeconomic History   Marital status: Married    Spouse name: Not on file   Number of children: Not on file   Years of education: Not on file   Highest education level: Not on file  Occupational History   Not on file  Tobacco Use   Smoking status: Never    Passive exposure: Past   Smokeless tobacco: Never  Vaping Use   Vaping Use: Never used  Substance and Sexual Activity   Alcohol use: Yes    Alcohol/week: 0.0 standard drinks of alcohol    Comment: 3 beers 3 nights a week   Drug use: No   Sexual activity: Never  Other Topics Concern   Not on file  Social History Narrative   Not on file   Social Determinants of Health   Financial Resource Strain: Not on file  Food Insecurity: Not on file  Transportation Needs: Not on file  Physical  Activity: Not on file  Stress: Not on file  Social Connections: Not on file  Intimate Partner Violence: Not on file     Allergies  Allergen Reactions   Amiodarone Nausea Only   Penicillins Hives    Has patient had a PCN reaction causing immediate rash, facial/tongue/throat swelling, SOB or lightheadedness with hypotension: YES Has patient had a PCN reaction causing severe rash involving mucus membranes or skin necrosis: NO Has patient had a PCN reaction that required hospitalization NO Has patient had a PCN reaction occurring within the last 10 years: NO If all of the above answers are "NO", then may proceed with Cephalosporin use.      Outpatient Medications Prior to Visit  Medication Sig Dispense Refill   albuterol (VENTOLIN HFA) 108 (90 Base) MCG/ACT inhaler Inhale 2 puffs into the lungs every 6 (six) hours as needed for wheezing or shortness of breath. 8 g 6   aspirin EC 81 MG tablet Take 81 mg by mouth daily.     calcium-vitamin D (OSCAL WITH D) 500-5 MG-MCG tablet Take 1 tablet by mouth daily. 30 tablet 2   diazepam (VALIUM) 10 MG tablet Take 10 mg by mouth daily as needed for sleep.     feeding supplement (ENSURE ENLIVE / ENSURE PLUS) LIQD Take 237 mLs by mouth 2 (two) times daily between meals.     furosemide (LASIX) 40 MG tablet Take 80 mg by mouth daily.     gabapentin (NEURONTIN) 300 MG capsule Take 300-600 mg by mouth See admin instructions. Take 300 mg in the morning, 300 mg in the afternoon, and 600 mg at bedtime     HYDROcodone-acetaminophen (NORCO/VICODIN) 5-325 MG tablet Take 2 tablets by mouth 2 (two) times daily.     ipratropium-albuterol (DUONEB) 0.5-2.5 (3) MG/3ML SOLN SMARTSIG:3 Milliliter(s) Via Nebulizer Twice Daily PRN     magnesium oxide (MAGOX 400) 400 (240 Mg) MG tablet Take 1 tablet (400 mg total) by mouth daily. 30 tablet 2   metoprolol succinate (TOPROL-XL) 25 MG 24 hr tablet TAKE 1 TABLET BY MOUTH ONCE DAILY. (Patient taking differently: Take 25 mg by  mouth daily.) 90 tablet 3   Multiple Vitamin (MULTI-VITAMINS) TABS Take 1 tablet by mouth daily.      ondansetron (ZOFRAN ODT) 4 MG disintegrating tablet Take 1 tablet (4 mg total) by mouth every 8 (eight) hours as needed for nausea or vomiting. 20 tablet 0   potassium chloride SA (KLOR-CON M) 20 MEQ tablet Take 1 tablet (20 mEq total) by mouth 3 (three) times daily. 90 tablet 0   propafenone (RYTHMOL) 300 MG tablet Take 0.5 tablets (150 mg total) by mouth every 8 (eight) hours. 135 tablet 1   sertraline (ZOLOFT) 100 MG tablet Take 150 mg by mouth daily.  Turmeric 500 MG CAPS Take 500 mg by mouth 3 (three) times a week.     warfarin (COUMADIN) 1 MG tablet Take 3 tablets daily except 2 tablets on Sundays and Wednesdays 80 tablet 5   atorvastatin (LIPITOR) 10 MG tablet Take 10 mg by mouth daily. (Patient not taking: Reported on 04/08/2022)     budesonide-formoterol (SYMBICORT) 160-4.5 MCG/ACT inhaler Inhale 2 puffs into the lungs 2 (two) times daily. (Patient not taking: Reported on 04/08/2022) 1 each 6   pantoprazole (PROTONIX) 40 MG tablet Take 1 tablet (40 mg total) by mouth 2 (two) times daily before a meal. (Patient not taking: Reported on 04/08/2022) 60 tablet 1   Tiotropium Bromide Monohydrate (SPIRIVA RESPIMAT) 2.5 MCG/ACT AERS Inhale 2 puffs into the lungs daily. (Patient not taking: Reported on 04/08/2022) 4 g 11   No facility-administered medications prior to visit.    Review of Systems  Constitutional:  Negative for chills, fever, malaise/fatigue and weight loss.  HENT:  Negative for congestion, sinus pain and sore throat.   Eyes: Negative.   Respiratory:  Positive for cough and shortness of breath. Negative for hemoptysis, sputum production and wheezing.   Cardiovascular:  Negative for chest pain, palpitations, orthopnea, claudication and leg swelling.  Gastrointestinal:  Negative for abdominal pain, heartburn, nausea and vomiting.  Genitourinary: Negative.   Musculoskeletal:   Negative for joint pain and myalgias.  Skin:  Negative for rash.  Neurological:  Negative for weakness.  Endo/Heme/Allergies: Negative.   Psychiatric/Behavioral:  Positive for depression. The patient is nervous/anxious.     Objective:   Vitals:   04/08/22 1121  BP: 110/68  Pulse: 67  Temp: 98.3 F (36.8 C)  SpO2: 99%  Weight: 102 lb 3.2 oz (46.4 kg)  Height: _0  (1.626 m)     Physical Exam Constitutional:      General: She is not in acute distress.    Appearance: She is not ill-appearing.     Comments: thin  HENT:     Head: Normocephalic and atraumatic.  Eyes:     General: No scleral icterus.    Conjunctiva/sclera: Conjunctivae normal.  Cardiovascular:     Rate and Rhythm: Normal rate and regular rhythm.     Pulses: Normal pulses.     Heart sounds: Normal heart sounds. No murmur heard. Pulmonary:     Effort: Pulmonary effort is normal.     Breath sounds: Normal breath sounds. Decreased air movement present. No wheezing, rhonchi or rales.  Musculoskeletal:     Right lower leg: No edema.     Left lower leg: No edema.  Lymphadenopathy:     Cervical: Cervical adenopathy present.  Skin:    General: Skin is warm and dry.     Comments: Bella Kennedy appearing erythema of skin of bilateral lower extremities from hx of vasculitis  Neurological:     General: No focal deficit present.     Mental Status: She is alert.    CBC    Component Value Date/Time   WBC 8.3 11/09/2021 0640   RBC 3.47 (L) 11/09/2021 0640   HGB 9.6 (L) 11/09/2021 0640   HGB 14.4 07/05/2019 1653   HCT 30.8 (L) 11/09/2021 0640   HCT 44.2 07/05/2019 1653   PLT 243 11/09/2021 0640   PLT 479 (H) 07/05/2019 1653   MCV 88.8 11/09/2021 0640   MCV 87 07/05/2019 1653   MCH 27.7 11/09/2021 0640   MCHC 31.2 11/09/2021 0640   RDW 13.6 11/09/2021 0640   RDW 13.6  07/05/2019 1653   LYMPHSABS 3.1 11/09/2021 0640   MONOABS 0.9 11/09/2021 0640   EOSABS 0.2 11/09/2021 0640   BASOSABS 0.1 11/09/2021 0640       Latest Ref Rng & Units 04/08/2022   12:25 PM 03/18/2022    1:22 PM 02/03/2022    3:37 PM  BMP  Glucose 70 - 99 mg/dL 74  77  93   BUN 6 - 23 mg/dL _0 Creatinine 0.40 - 1.20 mg/dL 0.79  0.78  0.93   BUN/Creat Ratio 6 - 22 (calc)  SEE NOTE:    Sodium 135 - 145 mEq/L 140  140  140   Potassium 3.5 - 5.1 mEq/L 3.0  3.7  3.4   Chloride 96 - 112 mEq/L 97  100  104   CO2 19 - 32 mEq/L 35  28  25   Calcium 8.4 - 10.5 mg/dL 9.0  9.8  9.3    Chest imaging: CXR 02/03/22 Normal heart size post AVR and LEFT atrial appendage clipping.   Mediastinal contours and pulmonary vascularity normal.   BILATERAL nipple shadows unchanged.   No acute infiltrate, pleural effusion, or pneumothorax.   Osseous demineralization with dextroconvex thoracolumbar scoliosis and chronic compression fracture of an upper lumbar vertebra.  CXR 07/16/21 The cardio pericardial silhouette is enlarged. Lungs are hyperexpanded Interstitial markings are diffusely coarsened with chronic features. The visualized bony structures of the thorax are unremarkable. Telemetry leads overlie the chest.  PFT:    Latest Ref Rng & Units 10/06/2021    2:00 PM  PFT Results  FVC-Pre L 1.81   FVC-Predicted Pre % 54   FVC-Post L 1.21   FVC-Predicted Post % 36   Pre FEV1/FVC % % 74   Post FEV1/FCV % % 69   FEV1-Pre L 1.34   FEV1-Predicted Pre % 51   FEV1-Post L 0.83   DLCO uncorrected ml/min/mmHg 8.99   DLCO UNC% % 44   DLCO corrected ml/min/mmHg 8.99   DLCO COR %Predicted % 44   DLVA Predicted % 76   PFT 2023: Severe obstruction and moderate diffusion defect  Labs:  Path:  Echo 07/20/21: LVEF 60 to 65%.  Moderate LVH.  RV systolic function and size are normal.  Mildly elevated pulmonary artery systolic pressure.  Left atrial size is moderately dilated.  Right atrial size is mildly dilated.  Heart Catheterization:  Assessment & Plan:   Chronic hypoxemic respiratory failure (HCC) - Plan: CT CHEST HIGH  RESOLUTION  Chronic obstructive pulmonary disease, unspecified COPD type ([place]) - Plan: CT CHEST HIGH RESOLUTION  Vasculitis ([place]) - Plan: ANCA Screen Reflex Titer, Histone antibodies, IgG, blood, Antinuclear Antib (ANA), Comp Met (CMET), Comp Met (CMET), Antinuclear Antib (ANA), Histone antibodies, IgG, blood, ANCA Screen Reflex Titer  Discussion: Floy Angert is a 61 year old woman, never smoker with history of congenital heart disease with coarctation repair at age 74, bicuspid AV s/p replacement and aortic arch repair in 2016, PAF s/p MAZE and left atrial appendage occlusion and vasculitis of lower extremities in 2019 who returns to pulmonary clinic for COPD.   She has severe obstruction and moderate diffusion defect on pulmonary function tests. Her risk factors include significant secondhand smoke exposure along with chronic exposure to wood-burning furnace.  She is to continue breztri inhaler 2 puffs twice daily and as needed albuterol.   She is to continue supplemental oxygen 3 L throughout the day and night at this time.    Skin biopsy  from 2019 shows evidence of vasculitis with differential of Henoch-Schonlein purpura, urticarial vasculitits, cryoglobulinemia, serum sickness and connective tissue disease.  ANA and ANCA testing from today are negative.   We will check a HRCT chest for pulmonary involvement of vasculitis.   I have recommended she follow up with Dr. Benjamine Mola of rheumatology for further workup and management.  Follow-up in 6 weeks  Freda Jackson, MD [facility] Office: [phone]   Current Outpatient Medications:    albuterol (VENTOLIN HFA) 108 (90 Base) MCG/ACT inhaler, Inhale 2 puffs into the lungs every 6 (six) hours as needed for wheezing or shortness of breath., Disp: 8 g, Rfl: 6   aspirin EC 81 MG tablet, Take 81 mg by mouth daily., Disp: , Rfl:    calcium-vitamin D (OSCAL WITH D) 500-5 MG-MCG tablet, Take 1 tablet by mouth daily., Disp:  30 tablet, Rfl: 2   diazepam (VALIUM) 10 MG tablet, Take 10 mg by mouth daily as needed for sleep., Disp: , Rfl:    feeding supplement (ENSURE ENLIVE / ENSURE PLUS) LIQD, Take 237 mLs by mouth 2 (two) times daily between meals., Disp: , Rfl:    furosemide (LASIX) 40 MG tablet, Take 80 mg by mouth daily., Disp: , Rfl:    gabapentin (NEURONTIN) 300 MG capsule, Take 300-600 mg by mouth See admin instructions. Take 300 mg in the morning, 300 mg in the afternoon, and 600 mg at bedtime, Disp: , Rfl:    HYDROcodone-acetaminophen (NORCO/VICODIN) 5-325 MG tablet, Take 2 tablets by mouth 2 (two) times daily., Disp: , Rfl:    ipratropium-albuterol (DUONEB) 0.5-2.5 (3) MG/3ML SOLN, SMARTSIG:3 Milliliter(s) Via Nebulizer Twice Daily PRN, Disp: , Rfl:    magnesium oxide (MAGOX 400) 400 (240 Mg) MG tablet, Take 1 tablet (400 mg total) by mouth daily., Disp: 30 tablet, Rfl: 2   metoprolol succinate (TOPROL-XL) 25 MG 24 hr tablet, TAKE 1 TABLET BY MOUTH ONCE DAILY. (Patient taking differently: Take 25 mg by mouth daily.), Disp: 90 tablet, Rfl: 3   Multiple Vitamin (MULTI-VITAMINS) TABS, Take 1 tablet by mouth daily. , Disp: , Rfl:    ondansetron (ZOFRAN ODT) 4 MG disintegrating tablet, Take 1 tablet (4 mg total) by mouth every 8 (eight) hours as needed for nausea or vomiting., Disp: 20 tablet, Rfl: 0   potassium chloride SA (KLOR-CON M) 20 MEQ tablet, Take 1 tablet (20 mEq total) by mouth 3 (three) times daily., Disp: 90 tablet, Rfl: 0   propafenone (RYTHMOL) 300 MG tablet, Take 0.5 tablets (150 mg total) by mouth every 8 (eight) hours., Disp: 135 tablet, Rfl: 1   sertraline (ZOLOFT) 100 MG tablet, Take 150 mg by mouth daily., Disp: , Rfl:    Turmeric 500 MG CAPS, Take 500 mg by mouth 3 (three) times a week., Disp: , Rfl:    warfarin (COUMADIN) 1 MG tablet, Take 3 tablets daily except 2 tablets on Sundays and Wednesdays, Disp: 80 tablet, Rfl: 5   atorvastatin (LIPITOR) 10 MG tablet, Take 10 mg by mouth daily.  (Patient not taking: Reported on 04/08/2022), Disp: , Rfl:    budesonide-formoterol (SYMBICORT) 160-4.5 MCG/ACT inhaler, Inhale 2 puffs into the lungs 2 (two) times daily. (Patient not taking: Reported on 04/08/2022), Disp: 1 each, Rfl: 6   pantoprazole (PROTONIX) 40 MG tablet, Take 1 tablet (40 mg total) by mouth 2 (two) times daily before a meal. (Patient not taking: Reported on 04/08/2022), Disp: 60 tablet, Rfl: 1   Tiotropium Bromide Monohydrate (SPIRIVA RESPIMAT) 2.5  MCG/ACT AERS, Inhale 2 puffs into the lungs daily. (Patient not taking: Reported on 04/08/2022), Disp: 4 g, Rfl: 11

## 2022-04-08 NOTE — Patient Instructions (Addendum)
We will check labs today for vasculitis  We will schedule you for a CT Chest scan  We will request your skin biopsy results from [facility]  We will apply for patient assistance for the [facility] inhaler  Follow up in 6 weeks

## 2022-04-09 DIAGNOSIS — J449 Chronic obstructive pulmonary disease, unspecified: Secondary | ICD-10-CM | POA: Diagnosis not present

## 2022-04-10 NOTE — Telephone Encounter (Signed)
Called patient.  She has been informed of CT Chest high resolution on 05/12/22 and has a follow up appointment with Dr. Erin Fulling 05/21/22.  Pt had no further questions.

## 2022-04-13 LAB — ANA: Anti Nuclear Antibody (ANA): NEGATIVE

## 2022-04-13 LAB — ANCA SCREEN W REFLEX TITER: ANCA SCREEN: NEGATIVE

## 2022-04-13 LAB — HISTONE ANTIBODIES, IGG, BLOOD: Histone Antibodies: <1 U (ref <1.0)

## 2022-04-14 ENCOUNTER — Ambulatory Visit: Payer: 59 | Attending: Cardiovascular Disease | Admitting: *Deleted

## 2022-04-14 ENCOUNTER — Encounter: Payer: Self-pay | Admitting: Pulmonary Disease

## 2022-04-14 ENCOUNTER — Encounter: Payer: Self-pay | Admitting: Cardiovascular Disease

## 2022-04-14 DIAGNOSIS — Q231 Congenital insufficiency of aortic valve: Secondary | ICD-10-CM | POA: Diagnosis not present

## 2022-04-14 DIAGNOSIS — I4892 Unspecified atrial flutter: Secondary | ICD-10-CM

## 2022-04-14 DIAGNOSIS — Z5181 Encounter for therapeutic drug level monitoring: Secondary | ICD-10-CM | POA: Diagnosis not present

## 2022-04-14 LAB — POCT INR: INR: 1.5 — AB (ref 2.0–3.0)

## 2022-04-14 NOTE — Patient Instructions (Signed)
Increase warfarin to 3 tablets daily except 4 tablets on Tuesdays and Fridays Recheck in 1 week Continue Ensure 2 daily per MD

## 2022-04-15 NOTE — Telephone Encounter (Signed)
Dr. Erin Fulling is wanting pt to have the CT performed this month instead of in January. Can we please get this rescheduled for pt?

## 2022-04-17 NOTE — Telephone Encounter (Signed)
Received the following message from patient:   "Could you please tell me how my bloodwork was negative for vasculitis when I do have it?  Were you testing for a different form or if?  I know there are like5 or so differences forms.  Thank you because I would really like to understand that.   And as I said I can do the scan whenever possible.   I hope everyone has a peaceful holiday!  Thank you for all you do for me.   Mallory Sutton"  Dr. Erin Fulling, can you please advise? Thanks!

## 2022-04-18 NOTE — Progress Notes (Incomplete)
Synopsis: Referred in April 2023 for COPD and hospital follow  Subjective:   PATIENT ID: Mallory Sutton GENDER: female DOB: October 12, 1960, MRN: [id number]  HPI  Chief Complaint  Patient presents with  . Follow-up   Mallory Sutton is a 61 year old woman, never smoker with history of congenital heart disease with coarctation repair at age 47, bicuspid AV s/p replacement and aortic arch repair in 2016, PAF s/p MAZE and left atrial appendage occlusion and vasculitis of lower extremities in 2019 who returns to pulmonary clinic for COPD.   She reports progressive shortness of breath and is now using 3L of supplemental oxygen. She currently reports having a cold infection. She was diagnosed with sleep apnea by her primary care team. She was started on steroid taper 11/20 for her shortness of breath and skin rashes. She first noted a skin rash on her lower extremities which she had biopsies in 2019 by dermatology which showed evidence of vasculitis with differential of Henoch-Schonlein purpura, urticarial vasculitits, cryoglobulinemia, serum sickness and connective tissue disease. She reports discussing methotrexate treatment at that time but did not want to start it due to concerns of cancer.   OV 10/06/21 She was started on spiriva 2.45mg 2 puffs daily at last visit in addition to her symbicort 160-4.523m 2 puffs twice daily withimprovement of her respiratory symptoms. She has tried breztri per her PCP and prefers this inhaler over trelegy ellipta.   She continues to require continuous oxygen at 2L. PFTs today show severe obstruction with moderate diffusion defect.   Initial OV 08/05/21 [facility] to [facility] for acute hypoxemic respiratory failure with concern for COPD exacerbation.  Patient does not carry history of COPD and is a never smoker.  She reports significant secondhand smoke exposure in childhood from her parents and throughout her life from her husband and son.  She also  reports they have had a wood stove as their home heating source for many years.  Prior to last fall she only noticed shortness of breath when she was in atrial fibrillation.  She denies any history of cough or wheezing.  She reports her cough and wheezing during the hospital ideation is much improved.  She has increased cough when she lays down.  She denies any ongoing fevers, chills or sweats.  She is currently using Symbicort 1 puff in the morning and 1 puff at night.  She has an as needed Combivent inhaler.  She received nebulizer treatments in the hospital which she prefers over the inhaler therapy.  She reports family history of lung cancer in her grandfather.  Past Medical History:  Diagnosis Date  . Aortic stenosis   . ATRIAL ARRHYTHMIAS   . Bicuspid aortic valve   . CHF (congestive heart failure) ([facility]  . COARCTATION OF AORTA   . ENDOMETRIOSIS   . Heart murmur   . Paroxysmal atrial fibrillation (HCC)   . Shortness of breath   . THORACIC AORTIC ANEURYSM      Family History  Problem Relation Age of Onset  . Suicidality Mother   . Mental illness Mother   . Drug abuse Brother   . Healthy Daughter   . Healthy Son      Social History   Socioeconomic History  . Marital status: Married    Spouse name: Not on file  . Number of children: Not on file  . Years of education: Not on file  . Highest education level: Not on file  Occupational History  . Not on file  Tobacco Use  . Smoking status: Never    Passive exposure: Past  . Smokeless tobacco: Never  Vaping Use  . Vaping Use: Never used  Substance and Sexual Activity  . Alcohol use: Yes    Alcohol/week: 0.0 standard drinks of alcohol    Comment: 3 beers 3 nights a week  . Drug use: No  . Sexual activity: Never  Other Topics Concern  . Not on file  Social History Narrative  . Not on file   Social Determinants of Health   Financial Resource Strain: Not on file  Food Insecurity: Not on file  Transportation  Needs: Not on file  Physical Activity: Not on file  Stress: Not on file  Social Connections: Not on file  Intimate Partner Violence: Not on file     Allergies  Allergen Reactions  . Amiodarone Nausea Only  . Penicillins Hives    Has patient had a PCN reaction causing immediate rash, facial/tongue/throat swelling, SOB or lightheadedness with hypotension: YES Has patient had a PCN reaction causing severe rash involving mucus membranes or skin necrosis: NO Has patient had a PCN reaction that required hospitalization NO Has patient had a PCN reaction occurring within the last 10 years: NO If all of the above answers are "NO", then may proceed with Cephalosporin use.      Outpatient Medications Prior to Visit  Medication Sig Dispense Refill  . albuterol (VENTOLIN HFA) 108 (90 Base) MCG/ACT inhaler Inhale 2 puffs into the lungs every 6 (six) hours as needed for wheezing or shortness of breath. 8 g 6  . aspirin EC 81 MG tablet Take 81 mg by mouth daily.    . calcium-vitamin D (OSCAL WITH D) 500-5 MG-MCG tablet Take 1 tablet by mouth daily. 30 tablet 2  . diazepam (VALIUM) 10 MG tablet Take 10 mg by mouth daily as needed for sleep.    . feeding supplement (ENSURE ENLIVE / ENSURE PLUS) LIQD Take 237 mLs by mouth 2 (two) times daily between meals.    . furosemide (LASIX) 40 MG tablet Take 80 mg by mouth daily.    Marland Kitchen gabapentin (NEURONTIN) 300 MG capsule Take 300-600 mg by mouth See admin instructions. Take 300 mg in the morning, 300 mg in the afternoon, and 600 mg at bedtime    . HYDROcodone-acetaminophen (NORCO/VICODIN) 5-325 MG tablet Take 2 tablets by mouth 2 (two) times daily.    Marland Kitchen ipratropium-albuterol (DUONEB) 0.5-2.5 (3) MG/3ML SOLN SMARTSIG:3 Milliliter(s) Via Nebulizer Twice Daily PRN    . magnesium oxide (MAGOX 400) 400 (240 Mg) MG tablet Take 1 tablet (400 mg total) by mouth daily. 30 tablet 2  . metoprolol succinate (TOPROL-XL) 25 MG 24 hr tablet TAKE 1 TABLET BY MOUTH ONCE DAILY.  (Patient taking differently: Take 25 mg by mouth daily.) 90 tablet 3  . Multiple Vitamin (MULTI-VITAMINS) TABS Take 1 tablet by mouth daily.     . ondansetron (ZOFRAN ODT) 4 MG disintegrating tablet Take 1 tablet (4 mg total) by mouth every 8 (eight) hours as needed for nausea or vomiting. 20 tablet 0  . potassium chloride SA (KLOR-CON M) 20 MEQ tablet Take 1 tablet (20 mEq total) by mouth 3 (three) times daily. 90 tablet 0  . propafenone (RYTHMOL) 300 MG tablet Take 0.5 tablets (150 mg total) by mouth every 8 (eight) hours. 135 tablet 1  . sertraline (ZOLOFT) 100 MG tablet Take 150 mg by mouth daily.    Marland Kitchen  Turmeric 500 MG CAPS Take 500 mg by mouth 3 (three) times a week.    . warfarin (COUMADIN) 1 MG tablet Take 3 tablets daily except 2 tablets on Sundays and Wednesdays 80 tablet 5  . atorvastatin (LIPITOR) 10 MG tablet Take 10 mg by mouth daily. (Patient not taking: Reported on 04/08/2022)    . budesonide-formoterol (SYMBICORT) 160-4.5 MCG/ACT inhaler Inhale 2 puffs into the lungs 2 (two) times daily. (Patient not taking: Reported on 04/08/2022) 1 each 6  . pantoprazole (PROTONIX) 40 MG tablet Take 1 tablet (40 mg total) by mouth 2 (two) times daily before a meal. (Patient not taking: Reported on 04/08/2022) 60 tablet 1  . Tiotropium Bromide Monohydrate (SPIRIVA RESPIMAT) 2.5 MCG/ACT AERS Inhale 2 puffs into the lungs daily. (Patient not taking: Reported on 04/08/2022) 4 g 11   No facility-administered medications prior to visit.    Review of Systems  Constitutional:  Negative for chills, fever, malaise/fatigue and weight loss.  HENT:  Negative for congestion, sinus pain and sore throat.   Eyes: Negative.   Respiratory:  Positive for cough and shortness of breath. Negative for hemoptysis, sputum production and wheezing.   Cardiovascular:  Negative for chest pain, palpitations, orthopnea, claudication and leg swelling.  Gastrointestinal:  Negative for abdominal pain, heartburn, nausea and vomiting.   Genitourinary: Negative.   Musculoskeletal:  Negative for joint pain and myalgias.  Skin:  Negative for rash.  Neurological:  Negative for weakness.  Endo/Heme/Allergies: Negative.   Psychiatric/Behavioral:  Positive for depression. The patient is nervous/anxious.     Objective:   Vitals:   04/08/22 1121  BP: 110/68  Pulse: 67  Temp: 98.3 F (36.8 C)  SpO2: 99%  Weight: 102 lb 3.2 oz (46.4 kg)  Height: _0  (1.626 m)     Physical Exam Constitutional:      General: She is not in acute distress.    Appearance: She is not ill-appearing.     Comments: thin  HENT:     Head: Normocephalic and atraumatic.  Eyes:     General: No scleral icterus.    Conjunctiva/sclera: Conjunctivae normal.  Cardiovascular:     Rate and Rhythm: Normal rate and regular rhythm.     Pulses: Normal pulses.     Heart sounds: Normal heart sounds. No murmur heard. Pulmonary:     Effort: Pulmonary effort is normal.     Breath sounds: Normal breath sounds. Decreased air movement present. No wheezing, rhonchi or rales.  Musculoskeletal:     Right lower leg: No edema.     Left lower leg: No edema.  Lymphadenopathy:     Cervical: Cervical adenopathy present.  Skin:    General: Skin is warm and dry.     Comments: Bella Kennedy appearing erythema of skin of bilateral lower extremities from hx of vasculitis  Neurological:     General: No focal deficit present.     Mental Status: She is alert.    CBC    Component Value Date/Time   WBC 8.3 11/09/2021 0640   RBC 3.47 (L) 11/09/2021 0640   HGB 9.6 (L) 11/09/2021 0640   HGB 14.4 07/05/2019 1653   HCT 30.8 (L) 11/09/2021 0640   HCT 44.2 07/05/2019 1653   PLT 243 11/09/2021 0640   PLT 479 (H) 07/05/2019 1653   MCV 88.8 11/09/2021 0640   MCV 87 07/05/2019 1653   MCH 27.7 11/09/2021 0640   MCHC 31.2 11/09/2021 0640   RDW 13.6 11/09/2021 0640   RDW 13.6  07/05/2019 1653   LYMPHSABS 3.1 11/09/2021 0640   MONOABS 0.9 11/09/2021 0640   EOSABS 0.2  11/09/2021 0640   BASOSABS 0.1 11/09/2021 0640      Latest Ref Rng & Units 04/08/2022   12:25 PM 03/18/2022    1:22 PM 02/03/2022    3:37 PM  BMP  Glucose 70 - 99 mg/dL 74  77  93   BUN 6 - 23 mg/dL _0 Creatinine 0.40 - 1.20 mg/dL 0.79  0.78  0.93   BUN/Creat Ratio 6 - 22 (calc)  SEE NOTE:    Sodium 135 - 145 mEq/L 140  140  140   Potassium 3.5 - 5.1 mEq/L 3.0  3.7  3.4   Chloride 96 - 112 mEq/L 97  100  104   CO2 19 - 32 mEq/L 35  28  25   Calcium 8.4 - 10.5 mg/dL 9.0  9.8  9.3    Chest imaging: CXR 02/03/22 Normal heart size post AVR and LEFT atrial appendage clipping.   Mediastinal contours and pulmonary vascularity normal.   BILATERAL nipple shadows unchanged.   No acute infiltrate, pleural effusion, or pneumothorax.   Osseous demineralization with dextroconvex thoracolumbar scoliosis and chronic compression fracture of an upper lumbar vertebra.  CXR 07/16/21 The cardio pericardial silhouette is enlarged. Lungs are hyperexpanded Interstitial markings are diffusely coarsened with chronic features. The visualized bony structures of the thorax are unremarkable. Telemetry leads overlie the chest.  PFT:    Latest Ref Rng & Units 10/06/2021    2:00 PM  PFT Results  FVC-Pre L 1.81   FVC-Predicted Pre % 54   FVC-Post L 1.21   FVC-Predicted Post % 36   Pre FEV1/FVC % % 74   Post FEV1/FCV % % 69   FEV1-Pre L 1.34   FEV1-Predicted Pre % 51   FEV1-Post L 0.83   DLCO uncorrected ml/min/mmHg 8.99   DLCO UNC% % 44   DLCO corrected ml/min/mmHg 8.99   DLCO COR %Predicted % 44   DLVA Predicted % 76   PFT 2023: Severe obstruction and moderate diffusion defect  Labs:  Path:  Echo 07/20/21: LVEF 60 to 65%.  Moderate LVH.  RV systolic function and size are normal.  Mildly elevated pulmonary artery systolic pressure.  Left atrial size is moderately dilated.  Right atrial size is mildly dilated.  Heart Catheterization:  Assessment & Plan:   Chronic hypoxemic  respiratory failure (HCC) - Plan: CT CHEST HIGH RESOLUTION  Chronic obstructive pulmonary disease, unspecified COPD type ([facility]) - Plan: CT CHEST HIGH RESOLUTION  Vasculitis ([place]) - Plan: ANCA Screen Reflex Titer, Histone antibodies, IgG, blood, Antinuclear Antib (ANA), Comp Met (CMET), Comp Met (CMET), Antinuclear Antib (ANA), Histone antibodies, IgG, blood, ANCA Screen Reflex Titer  Discussion: Shatana Saxton is a 62 year old woman, never smoker with history of congenital heart disease with coarctation repair at age 39, bicuspid AV s/p replacement and aortic arch repair in 2016, PAF s/p MAZE and left atrial appendage occlusion and vasculitis of lower extremities in 2019 who returns to pulmonary clinic for COPD.   She has severe obstruction and moderate diffusion defect on pulmonary function tests. Her risk factors include significant secondhand smoke exposure along with chronic exposure to wood-burning furnace.  She is to continue breztri inhaler 2 puffs twice daily and as needed albuterol.   She is to continue supplemental oxygen 3 L throughout the day and night at this time.    Skin biopsy  from 2019 shows   Follow-up in 6 months.  Freda Jackson, MD [facility] Office: [phone]   Current Outpatient Medications:  .  albuterol (VENTOLIN HFA) 108 (90 Base) MCG/ACT inhaler, Inhale 2 puffs into the lungs every 6 (six) hours as needed for wheezing or shortness of breath., Disp: 8 g, Rfl: 6 .  aspirin EC 81 MG tablet, Take 81 mg by mouth daily., Disp: , Rfl:  .  calcium-vitamin D (OSCAL WITH D) 500-5 MG-MCG tablet, Take 1 tablet by mouth daily., Disp: 30 tablet, Rfl: 2 .  diazepam (VALIUM) 10 MG tablet, Take 10 mg by mouth daily as needed for sleep., Disp: , Rfl:  .  feeding supplement (ENSURE ENLIVE / ENSURE PLUS) LIQD, Take 237 mLs by mouth 2 (two) times daily between meals., Disp: , Rfl:  .  furosemide (LASIX) 40 MG tablet, Take 80 mg by mouth daily., Disp: , Rfl:   .  gabapentin (NEURONTIN) 300 MG capsule, Take 300-600 mg by mouth See admin instructions. Take 300 mg in the morning, 300 mg in the afternoon, and 600 mg at bedtime, Disp: , Rfl:  .  HYDROcodone-acetaminophen (NORCO/VICODIN) 5-325 MG tablet, Take 2 tablets by mouth 2 (two) times daily., Disp: , Rfl:  .  ipratropium-albuterol (DUONEB) 0.5-2.5 (3) MG/3ML SOLN, SMARTSIG:3 Milliliter(s) Via Nebulizer Twice Daily PRN, Disp: , Rfl:  .  magnesium oxide (MAGOX 400) 400 (240 Mg) MG tablet, Take 1 tablet (400 mg total) by mouth daily., Disp: 30 tablet, Rfl: 2 .  metoprolol succinate (TOPROL-XL) 25 MG 24 hr tablet, TAKE 1 TABLET BY MOUTH ONCE DAILY. (Patient taking differently: Take 25 mg by mouth daily.), Disp: 90 tablet, Rfl: 3 .  Multiple Vitamin (MULTI-VITAMINS) TABS, Take 1 tablet by mouth daily. , Disp: , Rfl:  .  ondansetron (ZOFRAN ODT) 4 MG disintegrating tablet, Take 1 tablet (4 mg total) by mouth every 8 (eight) hours as needed for nausea or vomiting., Disp: 20 tablet, Rfl: 0 .  potassium chloride SA (KLOR-CON M) 20 MEQ tablet, Take 1 tablet (20 mEq total) by mouth 3 (three) times daily., Disp: 90 tablet, Rfl: 0 .  propafenone (RYTHMOL) 300 MG tablet, Take 0.5 tablets (150 mg total) by mouth every 8 (eight) hours., Disp: 135 tablet, Rfl: 1 .  sertraline (ZOLOFT) 100 MG tablet, Take 150 mg by mouth daily., Disp: , Rfl:  .  Turmeric 500 MG CAPS, Take 500 mg by mouth 3 (three) times a week., Disp: , Rfl:  .  warfarin (COUMADIN) 1 MG tablet, Take 3 tablets daily except 2 tablets on Sundays and Wednesdays, Disp: 80 tablet, Rfl: 5 .  atorvastatin (LIPITOR) 10 MG tablet, Take 10 mg by mouth daily. (Patient not taking: Reported on 04/08/2022), Disp: , Rfl:  .  budesonide-formoterol (SYMBICORT) 160-4.5 MCG/ACT inhaler, Inhale 2 puffs into the lungs 2 (two) times daily. (Patient not taking: Reported on 04/08/2022), Disp: 1 each, Rfl: 6 .  pantoprazole (PROTONIX) 40 MG tablet, Take 1 tablet (40 mg total) by  mouth 2 (two) times daily before a meal. (Patient not taking: Reported on 04/08/2022), Disp: 60 tablet, Rfl: 1 .  Tiotropium Bromide Monohydrate (SPIRIVA RESPIMAT) 2.5 MCG/ACT AERS, Inhale 2 puffs into the lungs daily. (Patient not taking: Reported on 04/08/2022), Disp: 4 g, Rfl: 11

## 2022-04-19 ENCOUNTER — Encounter: Payer: Self-pay | Admitting: Pulmonary Disease

## 2022-04-21 NOTE — Progress Notes (Signed)
Patient ID: Mallory Sutton, female   DOB: 1960-11-21, 61 y.o.   MRN: [id number]      61 y.o. long term patient followed since 1999. She has had congenital heart disease with coarctation repair at age 89 via left thoracotomy. Known persistent left sided SVC  Followed for long time for bicuspid AV and aortic aneurysm Finally had AVR and arch repair by [name] in 2016. I was upset that he did not bypass her coarctation at the time She had PAF and MAZE with LAA occlusion. She has been stable on propafenone for years and followed by Dr Lovena Le in EP  She developed a neuropathy and painful ? Vascular rash in her legs 2019 with short course of steroids.   Recent US RLE with complex cystic lesion in posterior upper left thigh   TEE done 08/29/19 showed normal EF she had no AR and mean gradient across her AVR 14 mmHg. She has not had recent MRI/CT imaging of her coarctation but the MLD had been < 1 cm in past     Her care has been compromised in past due to lack of insurance and financial issues She has been sporadic at times with her INR checks being subRx the last month   Vasculitis does not seem well Rx Still with lots of discoloration and some pain in legs Also developing more cysts She needs to have on removed by Dr Constance Haw from back of right thigh as it is pressing on nerve Also has one above right knee with lateral knee effusion Had steroid injection right knee 11/01/20 Seen by rheumatology DR Benjamine Mola 12/31/20  Labs including ESR, ANCA Cryoglobulin and cardiolipin negative   TTE:  07/20/21  EF 60-65% Normal RV mild MR AVR On-X 19 mm mean gradient 29 mmHg trivial PVL DVI 0.44  CXR 07/16/21 Emphysema NAD   BNP 07/20/21 437 BUN/Cr BUN 25 Cr 1.19 K 3.8  Hct 32.5  INR 2.5   Hospitalized 3/15-21/23 for hypoxemic respiratory failure COPD She is a non smoker Wood stove for heat D/c on 2L's oxygen Symbicort and Spiriva Plan to do walk test and consider nocturnal oxymetry She was in NSR throughout and d/c with lasix  80 mg daily   08/15/21 emotionally a wreck She feels helpless with another medical issue and being tied to oxygen. Her husband Mikki Santee is being ugly to her in regard to living his life in the shadow of all her medical problems and he is getting too old to do all his landscaping work and finances have always been tight. I spoke to her daughter Raquel Sarna at that time and son lives with them   Seen by Dr Erin Fulling 10/06/21 PFTls with severe obstruction and moderate diffusion defect Still on 2 L's oxygen   Continues to struggle with vasculitis/pain in arms/legs and dyspnea with her lung dx DLCO is < 50% and FEV1 < 1 Now using 3L' [place]  Recent ANCA negative as was Histone Ab and ANA   She is emotional again today PAF at night better and likely related to anxiety and hypoxia. Has lost 20 lbs since April Frustrated with lack of diagnosis with lungs Cramping and locking up of her hands continues when K is low. Discussed only taking lasix as needed and checking labs today   ROS: Denies fever, malais, weight loss, blurry vision, decreased visual acuity, cough, sputum, SOB, hemoptysis, pleuritic pain, palpitaitons, heartburn, abdominal pain, melena, lower extremity edema, claudication, or rash.  All other systems reviewed and negative  General: There were no vitals filed for this visit.   LMP 07/16/2013    Affect appropriate Healthy:  appears stated age 89: normal Neck supple with no adenopathy JVP normal no bruits no thyromegaly Lungs clear with no wheezing and good diaphragmatic motion Heart:  A4/T3 click SEM through AVR  No AR  murmur, no rub, gallop or click PMI normal Post left thoracotomy and sternotomy. Coarctation murmur radiates to back  Abdomen: benighn, BS positve, no tenderness, no AAA no bruit.  No HSM or HJR Distal pulses intact with no bruits Post surgery age 50 left knee and bilateral knee swelling  Neuro non-focal Skin vasculitic rash arms and legs  Cystic mass back of right thigh With right  knee effusion and cyst above knee     Current Outpatient Medications  Medication Sig Dispense Refill   albuterol (VENTOLIN HFA) 108 (90 Base) MCG/ACT inhaler Inhale 2 puffs into the lungs every 6 (six) hours as needed for wheezing or shortness of breath. 8 g 6   aspirin EC 81 MG tablet Take 81 mg by mouth daily.     atorvastatin (LIPITOR) 10 MG tablet Take 10 mg by mouth daily. (Patient not taking: Reported on 04/08/2022)     budesonide-formoterol (SYMBICORT) 160-4.5 MCG/ACT inhaler Inhale 2 puffs into the lungs 2 (two) times daily. (Patient not taking: Reported on 04/08/2022) 1 each 6   calcium-vitamin D (OSCAL WITH D) 500-5 MG-MCG tablet Take 1 tablet by mouth daily. 30 tablet 2   diazepam (VALIUM) 10 MG tablet Take 10 mg by mouth daily as needed for sleep.     feeding supplement (ENSURE ENLIVE / ENSURE PLUS) LIQD Take 237 mLs by mouth 2 (two) times daily between meals.     furosemide (LASIX) 40 MG tablet Take 80 mg by mouth daily.     gabapentin (NEURONTIN) 300 MG capsule Take 300-600 mg by mouth See admin instructions. Take 300 mg in the morning, 300 mg in the afternoon, and 600 mg at bedtime     HYDROcodone-acetaminophen (NORCO/VICODIN) 5-325 MG tablet Take 2 tablets by mouth 2 (two) times daily.     ipratropium-albuterol (DUONEB) 0.5-2.5 (3) MG/3ML SOLN SMARTSIG:3 Milliliter(s) Via Nebulizer Twice Daily PRN     magnesium oxide (MAGOX 400) 400 (240 Mg) MG tablet Take 1 tablet (400 mg total) by mouth daily. 30 tablet 2   metoprolol succinate (TOPROL-XL) 25 MG 24 hr tablet TAKE 1 TABLET BY MOUTH ONCE DAILY. (Patient taking differently: Take 25 mg by mouth daily.) 90 tablet 3   Multiple Vitamin (MULTI-VITAMINS) TABS Take 1 tablet by mouth daily.      ondansetron (ZOFRAN ODT) 4 MG disintegrating tablet Take 1 tablet (4 mg total) by mouth every 8 (eight) hours as needed for nausea or vomiting. 20 tablet 0   pantoprazole (PROTONIX) 40 MG tablet Take 1 tablet (40 mg total) by mouth 2 (two) times  daily before a meal. (Patient not taking: Reported on 04/08/2022) 60 tablet 1   potassium chloride SA (KLOR-CON M) 20 MEQ tablet Take 1 tablet (20 mEq total) by mouth 3 (three) times daily. 90 tablet 0   propafenone (RYTHMOL) 300 MG tablet Take 0.5 tablets (150 mg total) by mouth every 8 (eight) hours. 135 tablet 1   sertraline (ZOLOFT) 100 MG tablet Take 150 mg by mouth daily.     Tiotropium Bromide Monohydrate (SPIRIVA RESPIMAT) 2.5 MCG/ACT AERS Inhale 2 puffs into the lungs daily. (Patient not taking: Reported on 04/08/2022) 4 g 11   Turmeric  500 MG CAPS Take 500 mg by mouth 3 (three) times a week.     warfarin (COUMADIN) 1 MG tablet Take 3 tablets daily except 2 tablets on Sundays and Wednesdays 80 tablet 5   No current facility-administered medications for this visit.    Allergies  Amiodarone and Penicillins  Electrocardiogram:  5/15  SR low voltage ? 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  Continue current medications and low sodium Dash type diet.   Aneursym:  In setting of bicuspid valve post  arch repair with graft.  Neuropathy/Rash:  Bilateral painful legs. F/U Rheumatology as her vasculitis may not be ideally Rx She only takes pulsed steroids. She is afraid to take MTX. Given worsening lung status will prescribe 20 mg of prednisone and have her f/u with Jari Favre and Loma Sousa to discuss further  COPD:  ? From 2nd hand smoke and wood burning stove non smoker F/U pulmonary continue inhalers and oxygen PFTls,severe obstruction moderate diffusion defect f/u Dr Erin Fulling  Will expedite Chest CT as I am sure we  are missing a diagnosis here and ? Related to vasculitis in legs  Edema:  don't think it is cardiac EF normal RV normal no evidence of pulmonary hypertension She has had some edema since diagnosis of vasculitis US LE;s negative DVT in 2021  she has been on Rx coumadin for her valve and DVT unlikely PRN lasix to stop low K check labs today   F/U pulmonary regarding oxygen and Rx COPD F/U Rheum regarding vasculitis  Suggested consult at [facility] Prednisone 20 mg daily PRN Lasix   F/U with me in 3 months  Jenkins Rouge

## 2022-04-21 NOTE — Telephone Encounter (Signed)
PCC's please advise what number pt can call to see if she can get a sooner appt for her CT scan. Thanks.

## 2022-04-22 NOTE — Telephone Encounter (Signed)
[facility] phone # is [phone] - as of 12/15 authorization was still pending with insurance.  Will route to triage so they can make pt aware thru MyChart of phone #.

## 2022-04-23 ENCOUNTER — Ambulatory Visit: Payer: 59 | Attending: Cardiovascular Disease | Admitting: *Deleted

## 2022-04-23 DIAGNOSIS — Q231 Congenital insufficiency of aortic valve: Secondary | ICD-10-CM | POA: Diagnosis not present

## 2022-04-23 DIAGNOSIS — I4892 Unspecified atrial flutter: Secondary | ICD-10-CM

## 2022-04-23 DIAGNOSIS — Z5181 Encounter for therapeutic drug level monitoring: Secondary | ICD-10-CM | POA: Diagnosis not present

## 2022-04-23 LAB — POCT INR: POC INR: 3.5

## 2022-04-23 NOTE — Patient Instructions (Signed)
Description   HOLD warfarin today then START taking 3 tablets daily except 4 tablets on Fridays Recheck in 1 week Continue Ensure 2 daily per MD

## 2022-04-29 ENCOUNTER — Ambulatory Visit (HOSPITAL_COMMUNITY): Admission: RE | Admit: 2022-04-29 | Payer: 59 | Source: Ambulatory Visit

## 2022-04-29 ENCOUNTER — Ambulatory Visit: Payer: 59 | Attending: Cardiovascular Disease | Admitting: Cardiovascular Disease

## 2022-04-29 ENCOUNTER — Encounter: Payer: Self-pay | Admitting: Cardiovascular Disease

## 2022-04-29 VITALS — BP 152/60 | HR 80 | Ht 64.0 in | Wt 98.6 lb

## 2022-04-29 DIAGNOSIS — Z8774 Personal history of (corrected) congenital malformations of heart and circulatory system: Secondary | ICD-10-CM | POA: Diagnosis not present

## 2022-04-29 DIAGNOSIS — R0602 Shortness of breath: Secondary | ICD-10-CM | POA: Diagnosis not present

## 2022-04-29 DIAGNOSIS — I480 Paroxysmal atrial fibrillation: Secondary | ICD-10-CM

## 2022-04-29 DIAGNOSIS — Z952 Presence of prosthetic heart valve: Secondary | ICD-10-CM | POA: Diagnosis not present

## 2022-04-29 DIAGNOSIS — I776 Arteritis, unspecified: Secondary | ICD-10-CM

## 2022-04-29 MED ORDER — POTASSIUM CHLORIDE CRYS ER 20 MEQ PO TBCR: EXTENDED_RELEASE_TABLET | ORAL | 3 refills | Status: DC

## 2022-04-29 MED ORDER — PREDNISONE 20 MG PO TABS: 20.0000 mg | ORAL_TABLET | Freq: Every day | ORAL | 3 refills | Status: DC

## 2022-04-29 NOTE — Patient Instructions (Addendum)
Medication Instructions:  Your physician has recommended you make the following change in your medication:  -Start Prednisone 20 mg tablets daily -Take Lasix daily as needed -Take Pottassium Chloride when taking lasix only   Labwork: BMET SED RATE  Testing/Procedures: Chest CT- Non Contrasted  Follow-Up: Follow up with Dr. [name] in 3 months.   Any Other Special Instructions Will Be Listed Below (If Applicable).     If you need a refill on your cardiac medications before your next appointment, please call your pharmacy.

## 2022-04-30 ENCOUNTER — Other Ambulatory Visit (HOSPITAL_COMMUNITY)
Admission: RE | Admit: 2022-04-30 | Discharge: 2022-04-30 | Disposition: A | Discharge status: Home / Self Care | Payer: 59 | Source: Ambulatory Visit | Attending: Cardiovascular Disease | Admitting: Cardiovascular Disease

## 2022-04-30 ENCOUNTER — Telehealth: Payer: Self-pay

## 2022-04-30 ENCOUNTER — Ambulatory Visit: Payer: 59 | Admitting: Nurse Practitioner

## 2022-04-30 ENCOUNTER — Ambulatory Visit (INDEPENDENT_AMBULATORY_CARE_PROVIDER_SITE_OTHER): Payer: 59 | Admitting: Nurse Practitioner

## 2022-04-30 DIAGNOSIS — I776 Arteritis, unspecified: Secondary | ICD-10-CM | POA: Diagnosis not present

## 2022-04-30 DIAGNOSIS — Z79899 Other long term (current) drug therapy: Secondary | ICD-10-CM

## 2022-04-30 DIAGNOSIS — J849 Interstitial pulmonary disease, unspecified: Secondary | ICD-10-CM

## 2022-04-30 LAB — BASIC METABOLIC PANEL
Anion gap: 8 (ref 5–15)
BUN: 13 mg/dL (ref 6–20)
CO2: 29 mmol/L (ref 22–32)
Calcium: 9.1 mg/dL (ref 8.9–10.3)
Chloride: 103 mmol/L (ref 98–111)
Creatinine, Ser: 0.68 mg/dL (ref 0.44–1.00)
GFR, Estimated: >60 mL/min (ref >60)
Glucose, Bld: 82 mg/dL (ref 70–99)
Potassium: 3.3 mmol/L — ABNORMAL LOW (ref 3.5–5.1)
Sodium: 140 mmol/L (ref 135–145)

## 2022-04-30 LAB — SEDIMENTATION RATE: Sed Rate: 12 mm/hr (ref 0–22)

## 2022-04-30 MED ORDER — POTASSIUM CHLORIDE CRYS ER 20 MEQ PO TBCR: EXTENDED_RELEASE_TABLET | ORAL | 3 refills | Status: DC

## 2022-04-30 NOTE — Telephone Encounter (Signed)
-----   Message from Josue Hector, MD sent at 04/30/2022  1:16 PM EST ----- She is going to take lasix as needed so take 20 mEq Klor con bid for a week then daily after that Repeat BMET in 4 weeks

## 2022-04-30 NOTE — Telephone Encounter (Signed)
Patient notified via [facility] user

## 2022-04-30 NOTE — Progress Notes (Signed)
04/30/2022: Peer to peer completed due to HRCT chest being denied as COPD was not approved diagnosis. Reviewed with Kennon Portela, MD, that HRCT chest was ordered for evaluation of ILD given patient's respiratory symptoms and history of vasculitis. HRCT chest was approved. Authorization code: [id number]. Approved through 10/27/2022. PCCs notified so they can schedule patient.

## 2022-05-05 ENCOUNTER — Ambulatory Visit: Payer: Medicaid Other | Attending: Cardiovascular Disease | Admitting: *Deleted

## 2022-05-05 ENCOUNTER — Ambulatory Visit (HOSPITAL_COMMUNITY): Payer: 59

## 2022-05-05 DIAGNOSIS — Q231 Congenital insufficiency of aortic valve: Secondary | ICD-10-CM | POA: Diagnosis not present

## 2022-05-05 DIAGNOSIS — I4892 Unspecified atrial flutter: Secondary | ICD-10-CM | POA: Diagnosis not present

## 2022-05-05 DIAGNOSIS — Z5181 Encounter for therapeutic drug level monitoring: Secondary | ICD-10-CM

## 2022-05-05 LAB — POCT INR: INR: 2.8 (ref 2.0–3.0)

## 2022-05-05 NOTE — Patient Instructions (Signed)
Continue warfarin 3 tablets daily except 4 tablets on Fridays Recheck in 3 week Continue Ensure 2 daily per MD

## 2022-05-07 ENCOUNTER — Encounter: Payer: Self-pay | Admitting: Cardiovascular Disease

## 2022-05-07 NOTE — Telephone Encounter (Signed)
Patient is following up--She requests to speak with Dr. Kyla Balzarine nurse. She reiterates that she would prefer to have her CT at [facility] location, under Dr. Kyla Balzarine order if at all possible. Please also confirm coverage through Medicaid. Patient assumes that it is still denied because insurance does not have copies of x-ray with Dr. Raeanne Barry.

## 2022-05-07 NOTE — Telephone Encounter (Signed)
Error

## 2022-05-08 ENCOUNTER — Encounter: Payer: Self-pay | Admitting: Pulmonary Disease

## 2022-05-08 NOTE — Telephone Encounter (Signed)
Mychart message sent by pt:  [facility] (supporting Freddi Starr, MD)1 hour ago (12:16 PM)    Hi, I am trying to find out if my scan has been approved by [employer] or Medicaid.   My cardiologist tried to go ahead and do the scan when I saw him on 12/27 but it had been denied by [facility] because they say follow up X-rays were never sent from Dr. Lisabeth Pick office.  I don't know why.   We have qualified for Medicaid with the expansion and I need to know if this scan has been pre approved by Medicaid or [employer].  I no longer have [employer] but they could have approved before 12/31.  I need the X-rays to be sent to [facility] for pre approval.  Dr. Mariana Arn office has been handling this as well to try to get it done sooner.  I just need to know if I am approved by Medicaid or not because the scan is next week.   And I need it done ASAP according to Dr. [name].   If you could please let me know I'd very much appreciate it.   If not please send the X-rays to Medicaid.   My info is; Mallory Sutton 05/01/1961 Medicaid # [id number] Thank you!!    Routing to [facility] for review.

## 2022-05-11 ENCOUNTER — Encounter: Payer: Self-pay | Admitting: Pulmonary Disease

## 2022-05-11 NOTE — Telephone Encounter (Signed)
Pt has sent another message checking about her CT/insurance.  New message sent by pt: [facility] (supporting Freddi Starr, MD)51 minutes ago (12:47 PM)    Hello, I still do not know if my CT scan scheduled for tomorrow can be done or not.  As I explained, we no longer have [employer] as we have qualified for Medicaid, which also requires pre approval.   Can someone please let me know?  I did the check in with [facility] and also changed my insurance there. I know it's been confusing because my cardiologist Dr. [name] also tried to have this done sooner but [employer] denied me because Dr. Erin Fulling never sent follow up X-rays to [facility].  I could have had the scan done on 12/27 through Dr. [name] has they been sent in before we changed insurance.   Please let me know what to do.   Thank you  Mallory Sutton to [employer]

## 2022-05-12 ENCOUNTER — Other Ambulatory Visit: Payer: 59

## 2022-05-12 ENCOUNTER — Encounter: Payer: Self-pay | Admitting: Pulmonary Disease

## 2022-05-12 ENCOUNTER — Telehealth: Payer: Self-pay | Admitting: Cardiovascular Disease

## 2022-05-12 NOTE — Telephone Encounter (Signed)
Will forward to Dr. [name] and [facility] triage.

## 2022-05-12 NOTE — Telephone Encounter (Signed)
Was supposed to have a CT done by pulmonologist but did not get the scan done due to not having it approved in time and x-rays not sent in time. Wants Dr. [name] send orders down to [facility] to have CT done ASAP.

## 2022-05-12 NOTE — Telephone Encounter (Addendum)
Pt has received authorizations for the CT for both [facility].  I have rescheduled with [facility] to their next available, 06/08/22 at 3:40 PM.  Pt should check in by 3:20 PM no prep & location is [address]  Pt's prior auths are good for both.  Please let pt know about new date & also to decide if appt w/ Dr. Erin Fulling needs to be r/s until after the CT.

## 2022-05-13 ENCOUNTER — Other Ambulatory Visit: Payer: Self-pay

## 2022-05-13 ENCOUNTER — Telehealth: Payer: Self-pay | Admitting: Pulmonary Disease

## 2022-05-13 DIAGNOSIS — I509 Heart failure, unspecified: Secondary | ICD-10-CM

## 2022-05-13 NOTE — Telephone Encounter (Signed)
Dr. Erin Fulling, please see mychart messages sent by pt from 1/9 and also see prior encounter from 1/5.

## 2022-05-13 NOTE — Telephone Encounter (Signed)
-----   Message from Josue Hector, MD sent at 05/13/2022  1:38 PM EST ----- Have patient come to [facility] echo and get limited bubble study to r/o shunt

## 2022-05-13 NOTE — Telephone Encounter (Signed)
Message sent to pt stating that pre-cert was not needed and schedulers would be giving her a call to schedule.

## 2022-05-13 NOTE — Telephone Encounter (Signed)
Order has been entered for scan. Sent to schedulers to send a message to pre-cert for verification. Will re-route to schedulers for update

## 2022-05-14 ENCOUNTER — Ambulatory Visit (HOSPITAL_COMMUNITY): Admission: RE | Admit: 2022-05-14 | Payer: Medicaid Other | Source: Ambulatory Visit

## 2022-05-14 ENCOUNTER — Other Ambulatory Visit: Payer: 59

## 2022-05-14 NOTE — Telephone Encounter (Signed)
New message sent by pt: [facility] (supporting Freddi Starr, MD)6 minutes ago (9:25 AM)    Dr. Erin Fulling, I am sorry that I was hostile because I am usually not like that.  Both my cardiologists, as you know believe I was misdiagnosed.  That happens and is no one's fault.  But Dr. Lovena Le terrified me when I saw him Nov 14th.  He said that he didn't even know if my lungs were strong enough for a biopsy (if needed) or any surgery.  He was adament that the damage could not have been caused by wood or second hand smoke.   He immediately changed my 02 level to 3.  Then I saw Dr. [name] on 12/27 and he was alarmed that the scan wasn't being done until 1/9 and tried to get me in THAT day.  So that scared me as well.  It's terrifying to not know what is wrong.  So I got very upset because I never received an answer from your office telling me whether or not I had approval.  That went on for  days with Raquel Sarna (bless her) could only tell me that the messages had been sent to patient coordinators.  Holly called me yesterday and profusely apologized for which I was grateful.  I just wasn't being answered as to the pre cert.  That's why I said never mind I'll go through [place] who has been my doctor for 25 years.  I do apologize but I think I have a very good reason for being so upset.   You are a very nice man, which I said but I had been told I was most likely misdiagnosed and then could get no answer on the approval so I got mad.  Thank you so much for responding yourself.  It means a lot.   Thank you again for answering an and wishing me well.   Take care Mallory Sutton    Routing to Dr. Erin Fulling as an Juluis Rainier.

## 2022-05-18 ENCOUNTER — Ambulatory Visit (HOSPITAL_COMMUNITY): Admission: RE | Admit: 2022-05-18 | Payer: Medicaid Other | Source: Ambulatory Visit

## 2022-05-18 ENCOUNTER — Encounter (HOSPITAL_COMMUNITY): Payer: Self-pay

## 2022-05-19 ENCOUNTER — Ambulatory Visit (HOSPITAL_COMMUNITY): Payer: Medicaid Other

## 2022-05-21 ENCOUNTER — Ambulatory Visit (HOSPITAL_COMMUNITY)
Admission: RE | Admit: 2022-05-21 | Discharge: 2022-05-21 | Disposition: A | Discharge status: Home / Self Care | Payer: Medicaid Other | Source: Ambulatory Visit | Attending: Cardiovascular Disease | Admitting: Cardiovascular Disease

## 2022-05-21 ENCOUNTER — Ambulatory Visit: Payer: 59 | Admitting: Pulmonary Disease

## 2022-05-21 DIAGNOSIS — I509 Heart failure, unspecified: Secondary | ICD-10-CM

## 2022-05-21 LAB — ECHOCARDIOGRAM LIMITED BUBBLE STUDY: S' Lateral: 3 cm

## 2022-05-21 NOTE — Progress Notes (Signed)
*  PRELIMINARY RESULTS* Echocardiogram Limited 2D Echocardiogram with a Bubble Study has been performed.  Mallory Sutton 05/21/2022, 2:57 PM

## 2022-05-25 ENCOUNTER — Ambulatory Visit (HOSPITAL_COMMUNITY): Admission: RE | Admit: 2022-05-25 | Payer: Medicaid Other | Source: Ambulatory Visit

## 2022-05-25 ENCOUNTER — Other Ambulatory Visit: Payer: Self-pay | Admitting: Pulmonary Disease

## 2022-05-25 DIAGNOSIS — J9611 Chronic respiratory failure with hypoxia: Secondary | ICD-10-CM

## 2022-05-25 DIAGNOSIS — J449 Chronic obstructive pulmonary disease, unspecified: Secondary | ICD-10-CM

## 2022-05-25 NOTE — Progress Notes (Unsigned)
Office Visit Note  Patient: Mallory Sutton             Date of Birth: 05/08/1960           MRN: [id number]             PCP: Valentino Nose, FNP Referring: Valentino Nose, FNP Visit Date: 05/26/2022   Subjective:  Follow-up   History of Present Illness: Mallory Sutton is a 62 y.o. female here for follow up for multiple ongoing symptoms we previously saw her for cutaneous vasculitis symptoms in 2022 without particular evidence of systemic disease activity noted on imaging or laboratory workup at that time.  She was not started on any maintenance immunomodulatory treatment.  However her condition has been overall getting significantly worse for about the past year.  She mostly notices a lot more trouble since she was hospitalized with hypoxia and shortness of breath in March of last year.  Ultimately diagnosis of COPD exacerbation and subsequently treated with short-term steroids and supplemental oxygen with follow-up within a few weeks later at pulmonology clinic. Her respiratory status has been worse with significant dyspnea on exertion and had some persistent hypoxia testing now on supplemental oxygen continuously since. Pulmonary function testing in June results are reported as consistent with severe obstructive defect as well as moderately reduced diffusion capacity. FEV1 51% predicted and FVC 54% predicted with DLCO 44%. X-ray in October demonstrated known chronic compression fracture there were no airspace or vascular abnormalities noted. She had updated 2D echocardiogram in November that appeared consistent with some diastolic dysfunction and questionable restenosis of coarctation.  No significant valvular regurgitation in estimated RVSP of 31.9 mmHg.  Pulmonology clinic follow-up in December also rechecked serologic markers including ANCA's and antihistone antibodies and also serum inflammatory markers all of which were normal.  Due to concern about increased extent of pulmonary symptoms along  with extensive vasculitic rashes she was started on moderate dose prednisone 20 mg p.o. daily but has not seen a dramatic difference in symptoms in the past month. She reports concern expressed by her cardiology team Dr. Lovena Le and Dr. [name] that the cardiac parameters and history do not appear to entirely account for the symptoms and they are very concerned about active vasculitis.  She was ordered to have high-resolution CT scan of the chest since December 6 with Dr. Erin Fulling but apparently this has not been scheduled and obtained as of yet.  Besides her respiratory problems, she has noticed several other systemic symptoms getting worse.  She has had greater extent of skin rashes no longer limited to her distal lower extremities now all the way up to her proximal legs is starting to see skin changes on upper body as well.  She has had recurrent episodic swelling in both knees not always painful but is sometimes and does have increased stiffness.  She has persistent unintentional weight loss, without any focal GI symptom complaints. By my review of recorded clinic weights she was maintaining a weight over 105 lbs in 2021 but has been consistent in upper 90s lbs for over a year. She is also been noticing some decrease in hearing acuity sometime in the past 6 to 12 months she did not recall any specific onset or time that this started. Her maintenance dose of lasix does not appear to be recently changed, although required additional potassium supplementation for it. Not having any new headache or vertigo symptoms.   03/19/2022 Transthoracic echocardiogram IMPRESSIONS   1. Left ventricular ejection  fraction, by estimation, is 65 to 70%. The left ventricle has normal function. The left ventricle has no regional wall motion abnormalities. There is moderate left ventricular hypertrophy. Left ventricular diastolic parameters are consistent with Grade II diastolic dysfunction (pseudonormalization). The average left  ventricular global longitudinal strain is -22.1 %. The global longitudinal strain is normal.   2. History of surgical aortic coactation repair in childhood. Descending aortic not well defined, but there is turbulent flow and gradient of 47 mmHg noted suggesting restenosis. Consider CTA for further evaluation.   3. Right ventricular systolic function is normal. The right ventricular size is normal. There is normal pulmonary artery systolic pressure. The estimated right ventricular systolic pressure is 30.8 mmHg.   4. Left atrial size was mildly dilated.   5. The mitral valve is mildly degenerative. Mild mitral valve regurgitation.   6. The aortic valve has been repaired/replaced. Aortic valve regurgitation is mild to moderate, cannot exclude paravaluvlar component. There is a 19 mm On-X valve present in the aortic position. Aortic valve mean gradient measures 28.0 mmHg and similar to prior study. Dimentionless index 0.51 argues against prosthetic stenosis.   7. The inferior vena cava is normal in size with greater than 50% respiratory variability, suggesting right atrial pressure of 3 mmHg.     Previous HPI 12/31/20 Mallory Sutton is a 62 y.o. 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 Leg lesions were first reported starting in 2019 biopsy consistent with vasculitis labs overall negative. She had recent right knee aspiration by orthopedic surgery.  She has chronic pain in the affected areas on her lower extremities not particularly debilitating and she does not notice specific improvement or worsening with position and activity.  Bilateral hand pain has worsened recently especially on the right hand with episodic swelling.  Outside of the legs no particular skin rashes or other visible  changes.   Labs reviewed 07/2017 ANA neg RF 10.9 ESR 9  Skin biopsy 02/21/2020 Angio dermatitis consistent with stasis dermatitis  There is a marked degree of vascular proliferation in the dermis associated with a lymphocytic infiltrate with scattered histiocytic cells.  The epidermis is intact.  Leukocytoclasia's and thrombosis are not conspicuous.  This histology correlates with stasis dermatitis in the proper clinical setting.  There is no appreciable atypia.  Though this can be seen in stasis, a traumatic etiology is in the differential diagnosis.  The iron stain is positive.  Control stained adequately.  Following review of the hematoxylin and eosin's and sections, a PAS stain was obtained to exclude a fungal infection.  The PAS stain is negative for fungal organisms.  Also, on a tissue Gram stain with an appropriately staining control, no bacterial pathogens are seen.  Multiple levels taken through the submitted block are examined.   2019 skin biopsy (original report not viewed) C/W Cutaneous vasculitis Nonspecific pathology differential including Henoch-Schnlein purpura, urticarial vasculitis, cryoglobulinemia, serum sickness, or other systemic connective tissue disease.  Imaging reviewed 11/01/20 Xray right knee 3 views left knee 1 view Impression: Mild right knee degenerative changes; severe left knee arthritis, specifically within the lateral compartment.   Review of Systems  Constitutional:  Positive for fatigue.  HENT:  Negative for mouth sores and mouth dryness.   Eyes:  Negative for dryness.  Respiratory:  Positive for shortness  of breath.   Cardiovascular:  Positive for chest pain and palpitations.  Gastrointestinal:  Negative for blood in stool, constipation and diarrhea.  Endocrine: Negative for increased urination.  Genitourinary:  Negative for involuntary urination.  Musculoskeletal:  Positive for joint pain, joint pain, joint swelling and morning stiffness. Negative  for gait problem, myalgias, muscle weakness, muscle tenderness and myalgias.  Skin:  Positive for color change and rash. Negative for hair loss and sensitivity to sunlight.  Allergic/Immunologic: Negative for susceptible to infections.  Neurological:  Positive for headaches. Negative for dizziness.  Hematological:  Negative for swollen glands.  Psychiatric/Behavioral:  Positive for depressed mood and sleep disturbance. The patient is nervous/anxious.     PMFS History:  Patient Active Problem List   Diagnosis Date Noted   Numbness    Hypocalcemia    GERD (gastroesophageal reflux disease) 11/09/2021   Leukocytosis 11/09/2021   Protein-calorie malnutrition, severe ([place]) 07/19/2021   Acute respiratory failure with hypoxia (HCC) 07/17/2021   Hypokalemia 07/17/2021   Anxiety and depression 07/17/2021   Chronic diastolic CHF (congestive heart failure) ([place]) 07/17/2021   COPD exacerbation ([place]) 07/16/2021   Vasculitis ([place]) 12/31/2020   Bilateral hand pain 12/31/2020   Benign skin lesion of thigh 10/15/2020   Atrial flutter (HCC)    Acute diastolic heart failure ([place]) 08/23/2014   Pleural effusion 08/23/2014   Pleural effusion, bilateral 08/23/2014   Dyspnea    Acute respiratory failure ([place]) 08/22/2014   Atrial fibrillation with RVR ([place]) 08/06/2014   Encounter for therapeutic drug monitoring 08/06/2014   S/P aortic valve replacement-07/25/14 at [facility] 08/06/2014   Shortness of breath 04/23/2010   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 07/25/14 [facility] 03/12/2010   Aortic valve disorder 09/12/2009   ENDOMETRIOSIS 09/12/2009   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 09/12/2009   Paroxysmal atrial fibrillation ([place]) 07/18/2008   BICUSPID AORTIC VALVE 07/18/2008    Past Medical History:  Diagnosis Date   Aortic stenosis    ATRIAL ARRHYTHMIAS    Bicuspid aortic valve    CHF (congestive heart failure) (HCC)    COARCTATION OF AORTA    ENDOMETRIOSIS     Heart murmur    Paroxysmal atrial fibrillation (HCC)    Shortness of breath    THORACIC AORTIC ANEURYSM    Vasculitis ([place])     Family History  Problem Relation Age of Onset   Suicidality Mother    Mental illness Mother    Drug abuse Brother    Healthy Daughter    Healthy Son    Past Surgical History:  Procedure Laterality Date   CARDIOVERSION N/A 08/29/2019   Procedure: CARDIOVERSION;  Surgeon: Arnoldo Lenis, MD;  Location: [facility];  Service: Endoscopy;  Laterality: N/A;   coarctation repair and residual restenosis     KNEE SURGERY     TEE WITHOUT CARDIOVERSION N/A 07/12/2019   Procedure: TRANSESOPHAGEAL ECHOCARDIOGRAM (TEE);  Surgeon: Sueanne Margarita, MD;  Location: [facility] ENDOS[facility];  Service: Cardiovascular;  Laterality: N/A;   TEE WITHOUT CARDIOVERSION N/A 08/29/2019   Procedure: TRANSESOPHAGEAL ECHOCARDIOGRAM (TEE) WITH PROPOFOL;  Surgeon: Arnoldo Lenis, MD;  Location: [facility];  Service: Endoscopy;  Laterality: N/A;   Social History   Social History Narrative   Not on file   Immunization History  Administered Date(s) Administered   Influenza,inj,Quad PF,6+ Mos 03/26/2014, 02/18/2015, 02/10/2016, 03/15/2017, 03/06/2019, 02/01/2020     Objective: Vital Signs: BP (!) 161/88 (BP Location: Left Arm, Patient Position: Sitting, Cuff Size:  Small)   Pulse 74   Resp 13   Ht '5\' 4"'$  (1.626 m)   Wt 99 lb 12.8 oz (45.3 kg)   LMP 07/16/2013   BMI 17.13 kg/m    Physical Exam HENT:     Mouth/Throat:     Mouth: Mucous membranes are moist.     Pharynx: Oropharynx is clear.  Eyes:     Conjunctiva/sclera: Conjunctivae normal.  Cardiovascular:     Rate and Rhythm: Normal rate.     Heart sounds: Murmur (Loud holosystolic murmur) heard.  Pulmonary:     Comments: On supplemental oxygen by nasal cannula 3 L/min Bibasilar inspiratory crackles Lymphadenopathy:     Cervical: No cervical adenopathy.  Skin:    General: Skin is warm and dry.     Findings: Rash present.      Comments: Diffuse broad reticular pattern rash extending from ankles all the way up to the thighs, faint blanchable livedo changes on the flexor surfaces of forearms, there appear to be some healing or closed ulcerations overlying rash areas on the legs no active ulcers and no upper extremity lesions  Neurological:     Mental Status: She is alert.  Psychiatric:        Mood and Affect: Mood normal.      Musculoskeletal Exam:  Elbows full ROM no tenderness or swelling Wrists full ROM no tenderness or swelling Fingers full ROM no tenderness or swelling Knees full ROM small effusions present bilaterally, left knee anterior well healed arthroplasty scar Ankles full ROM no tenderness or swelling  Investigation: No additional findings.  Imaging: ECHOCARDIOGRAM LIMITED BUBBLE STUDY  Result Date: 05/21/2022    ECHOCARDIOGRAM LIMITED REPORT   Patient Name:   HAYDAN WEDIG Date of Exam: 05/21/2022 Medical Rec #:  [id number]    Height:       64.0 in Accession #:    [id number]   Weight:       98.6 lb Date of Birth:  [birth year]   BSA:          1.448 m Patient Age:    63 years     BP:           na/na mmHg Patient Gender: F            HR:           73 bpm. Exam Location:  [facility] Procedure: Saline Contrast Bubble Study and Limited Echo Indications:    CHF  History:        Patient has prior history of Echocardiogram examinations, most                 recent 03/19/2022. CHF, COPD, Arrythmias:Atrial Fibrillation,                 Signs/Symptoms:Shortness of Breath; Risk Factors:Non-Smoker,                 Hypertension and Dyslipidemia. There is a 21m On-X prosthetic                 valve in the Aortic position. Procedure date 07/25/14. Aortic                 root repair, Coarctation of the Aorto.  Sonographer:    DWenda LowReferring Phys: [id number] 1. Left ventricular ejection fraction, by estimation, is 60 to 65%. The left ventricle has normal function. The left ventricle has no  regional wall motion abnormalities. There is moderate  left ventricular hypertrophy.  2. Right ventricular systolic function is normal. The right ventricular size is normal.  3. Agitated saline contrast bubble study was negative, with no evidence of any interatrial shunt.  4. Limited echo with bubble study to evaluate for intracardiac shunting FINDINGS  Left Ventricle: Left ventricular ejection fraction, by estimation, is 60 to 65%. The left ventricle has normal function. The left ventricle has no regional wall motion abnormalities. The left ventricular internal cavity size was normal in size. There is  moderate left ventricular hypertrophy. Right Ventricle: The right ventricular size is normal. Right vetricular wall thickness was not well visualized. Right ventricular systolic function is normal. IAS/Shunts: Agitated saline contrast was given intravenously to evaluate for intracardiac shunting. Agitated saline contrast bubble study was negative, with no evidence of any interatrial shunt. LEFT VENTRICLE PLAX 2D LVIDd:         4.20 cm LVIDs:         3.00 cm LV PW:         1.20 cm LV IVS:        1.40 cm LVOT diam:     1.90 cm LVOT Area:     2.84 cm  LEFT ATRIUM         Index LA diam:    4.20 cm 2.90 cm/m   AORTA Ao Root diam: 2.60 cm Ao Asc diam:  2.80 cm  SHUNTS Systemic Diam: 1.90 cm [name] MD Electronically signed by [name] MD Signature Date/Time: 05/21/2022/3:35:25 PM    Final     Recent Labs: Lab Results  Component Value Date   WBC 8.3 11/09/2021   HGB 9.6 (L) 11/09/2021   PLT 243 11/09/2021   NA 140 04/30/2022   K 3.3 (L) 04/30/2022   CL 103 04/30/2022   CO2 29 04/30/2022   GLUCOSE 82 04/30/2022   BUN 13 04/30/2022   CREATININE 0.68 04/30/2022   BILITOT 0.5 04/08/2022   ALKPHOS 84 04/08/2022   AST 21 04/08/2022   ALT 12 04/08/2022   PROT 6.8 04/08/2022   ALBUMIN 3.9 04/08/2022   CALCIUM 9.1 04/30/2022   GFRAA >60 08/25/2019    Speciality Comments: No specialty comments  available.  Procedures:  No procedures performed Allergies: Amiodarone and Penicillins   Assessment / Plan:     Visit Diagnoses: No diagnosis found.  ***  Orders: No orders of the defined types were placed in this encounter.  No orders of the defined types were placed in this encounter.    Follow-Up Instructions: No follow-ups on file.   Collier Salina, MD  Note - This record has been created using [employer].  Chart creation errors have been sought, but may not always  have been located. Such creation errors do not reflect on  the standard of medical care.

## 2022-05-26 ENCOUNTER — Encounter: Payer: Self-pay | Admitting: Internal Medicine

## 2022-05-26 ENCOUNTER — Ambulatory Visit: Payer: Medicaid Other | Attending: Internal Medicine | Admitting: Internal Medicine

## 2022-05-26 VITALS — BP 161/88 | HR 74 | Resp 13 | Ht 64.0 in | Wt 99.8 lb

## 2022-05-26 DIAGNOSIS — Z952 Presence of prosthetic heart valve: Secondary | ICD-10-CM | POA: Diagnosis not present

## 2022-05-26 DIAGNOSIS — R0602 Shortness of breath: Secondary | ICD-10-CM

## 2022-05-26 DIAGNOSIS — H9190 Unspecified hearing loss, unspecified ear: Secondary | ICD-10-CM

## 2022-05-26 DIAGNOSIS — J441 Chronic obstructive pulmonary disease with (acute) exacerbation: Secondary | ICD-10-CM | POA: Diagnosis not present

## 2022-05-26 DIAGNOSIS — I776 Arteritis, unspecified: Secondary | ICD-10-CM | POA: Diagnosis not present

## 2022-05-26 DIAGNOSIS — I480 Paroxysmal atrial fibrillation: Secondary | ICD-10-CM

## 2022-05-27 ENCOUNTER — Telehealth: Payer: Self-pay | Admitting: Pulmonary Disease

## 2022-05-27 ENCOUNTER — Ambulatory Visit (HOSPITAL_COMMUNITY): Payer: Medicaid Other

## 2022-05-27 NOTE — Telephone Encounter (Signed)
Patient was scheduled for HRCT Chest 1/22 and voicemail left on her phone with appointment date/time from our office staff. The scan was not done. I called patient's daughter 1/23 to see if she was able to help coordinate visits for the patient in order to get the proper testing done. She is to call our office in order to have the CT chest scan re-scheduled.   Freda Jackson, MD [facility] Office: [phone]   See Amion for personal pager PCCM on call pager [phone] until 7pm. Please call Elink 7p-7a. [phone]

## 2022-05-27 NOTE — Telephone Encounter (Signed)
PT Daughter Raquel Sarna calling. Asked for [facility]. Dr. Erin Fulling gave her Holly's name. Earnest Bailey was out sick. Daughter very calmly went over several issues she has had with our practice. Mom indicated she did not want to use our practice again because some of these issues listed below.   Messages about appointments that "pop up" on my chart last minute that she had no idea about.  Trying to schedule a CT scan and insurance issues in doing so. (Since then it is going to be done at cardiologist office.)  She has an autoimmune disease and her Cardiologist & Rumatologist disagree w/Dr. Lisabeth Pick evaluation of her mom and, she says they feel this is "unacceptable". They both state they are concerned with his diagnosis of COPD and feel it is far worse.   Raquel Sarna also states that you can see a history of multiple messages thru my chart that will show proof of communication issues.  Can we review her chart to see if another Dr. w/possibly more experience with her underlying conditions see her so we can continue to have her as a PT and secure a second opinion? She has been in several appts with her mom and Dr. Erin Fulling is "very nice" but then in further talking to her she states "he blew her off". She was very emotional and holding back tears as we spoke but in no way insincere or difficult to speak with.  I consulted w/Bailey who said to create a new encounter and send to [facility]. I will send to Triage as well since this requires permission to change Dr's.   Finally, please call Raquel Sarna to advise her of our plans to move forward to get her the best care and treatment possible for her mom. We are "caring people caring for people" and "Right there with you". Thanks.

## 2022-05-27 NOTE — Telephone Encounter (Signed)
Called and spoke with Mallory Sutton. I advised her that Dr. Erin Fulling had reached to her cardiologist several times to see whom he had in mind for her to see but has yet to hear back from him. She verbalized understanding.   As for the CT scan, Mallory Sutton believes that cardiology is working to get the CT scan scheduled.   Per Mallory Sutton, she is not sure if her mom has actually talked to Dr. [name] since 12/28 but she will confirm this afternoon after speaking with her. I did suggest that since she lives in [place], it might be best for her to establish with the [place] clinic.   Will leave this encounter open for follow up.

## 2022-05-29 ENCOUNTER — Other Ambulatory Visit (HOSPITAL_COMMUNITY): Payer: Self-pay | Admitting: Family Medicine

## 2022-05-29 DIAGNOSIS — J449 Chronic obstructive pulmonary disease, unspecified: Secondary | ICD-10-CM

## 2022-06-01 NOTE — Telephone Encounter (Signed)
I have left VM on pt's VM about this.  I had left a VM last week with the updated appt date per Dr. Kyla Balzarine request using our order for the CT.   Spoke to the patient's daughter, Raquel Sarna.  Pt is scheduled for Wednesday for CT.  Nothing further needed at this time.

## 2022-06-03 ENCOUNTER — Ambulatory Visit
Admission: RE | Admit: 2022-06-03 | Discharge: 2022-06-03 | Disposition: A | Discharge status: Home / Self Care | Payer: Medicaid Other | Source: Ambulatory Visit | Attending: Pulmonary Disease | Admitting: Pulmonary Disease

## 2022-06-03 ENCOUNTER — Telehealth: Payer: Self-pay | Admitting: *Deleted

## 2022-06-03 DIAGNOSIS — H9190 Unspecified hearing loss, unspecified ear: Secondary | ICD-10-CM | POA: Insufficient documentation

## 2022-06-03 DIAGNOSIS — J9611 Chronic respiratory failure with hypoxia: Secondary | ICD-10-CM

## 2022-06-03 DIAGNOSIS — J449 Chronic obstructive pulmonary disease, unspecified: Secondary | ICD-10-CM

## 2022-06-03 DIAGNOSIS — R591 Generalized enlarged lymph nodes: Secondary | ICD-10-CM

## 2022-06-03 NOTE — Telephone Encounter (Signed)
I tried to call patient to let her know of the scan results. No answer on the mobile or home number. I left a VM instructing her to give [place] a call today to review her results. If she does not return call by mid-afternoon, please to reach back out to her.   She has enlarged lymph nodes throughout the the middle of her chest and upper abdomen. These findings are concerning for lymphoma and I recommend we order a CT PET scan to be done stat.   Thanks, JD

## 2022-06-03 NOTE — Telephone Encounter (Signed)
I spoke with patient about the results of her scan and the concern for diffuse lymphadenopathy concerning for lymphoma.   I have placed an order for a CT PET scan that we can hopefully get scheduled 2/1 and 2/2 and plan for an excisional lymph node biopsy next week if necessary.  Freda Jackson, MD [facility] Office: [phone]   See Amion for personal pager PCCM on call pager [phone] until 7pm. Please call Elink 7p-7a. [phone]

## 2022-06-03 NOTE — Telephone Encounter (Signed)
PT daughter calling regarding missed call from Dr. Erin Fulling. Pls call @ [phone]

## 2022-06-03 NOTE — Telephone Encounter (Signed)
Call report from [place] at GI:  5. Numerous newly enlarged bilateral axillary, subpectoral, lower cervical, mediastinal, hilar, and most likely upper abdominal lymph nodes. In conjunction with partially imaged splenomegaly, findings are concerning for lymphoma.  Dr. Erin Fulling, please review and advise.  Thank you.

## 2022-06-03 NOTE — Telephone Encounter (Signed)
Attempted to call pt but unable to reach. Left message to return call.

## 2022-06-03 NOTE — Telephone Encounter (Signed)
ATC patient.  LM for patient to call back when available.

## 2022-06-04 ENCOUNTER — Encounter: Payer: Self-pay | Admitting: Internal Medicine

## 2022-06-04 ENCOUNTER — Ambulatory Visit: Payer: Medicaid Other | Attending: Internal Medicine | Admitting: Internal Medicine

## 2022-06-04 VITALS — BP 142/90 | HR 77 | Ht 64.0 in | Wt 104.0 lb

## 2022-06-04 DIAGNOSIS — I480 Paroxysmal atrial fibrillation: Secondary | ICD-10-CM | POA: Diagnosis not present

## 2022-06-04 NOTE — Patient Instructions (Signed)
Medication Instructions:  Your physician recommends that you continue on your current medications as directed. Please refer to the Current Medication list given to you today.  *If you need a refill on your cardiac medications before your next appointment, please call your pharmacy*   Lab Work: NONE   If you have labs (blood work) drawn today and your tests are completely normal, you will receive your results only by: [place] (if you have MyChart) OR A paper copy in the mail If you have any lab test that is abnormal or we need to change your treatment, we will call you to review the results.   Testing/Procedures: NONE    Follow-Up: At [facility], you and your health needs are our priority.  As part of our continuing mission to provide you with exceptional heart care, we have created designated Provider Care Teams.  These Care Teams include your primary Cardiologist (physician) and Advanced Practice Providers (APPs -  Physician Assistants and Nurse Practitioners) who all work together to provide you with the care you need, when you need it.  We recommend signing up for the patient portal called "MyChart".  Sign up information is provided on this After Visit Summary.  MyChart is used to connect with patients for Virtual Visits (Telemedicine).  Patients are able to view lab/test results, encounter notes, upcoming appointments, etc.  Non-urgent messages can be sent to your provider as well.   To learn more about what you can do with MyChart, go to [email or web address].    Your next appointment:    To Be Determined   Provider:   Cristopher Peru, MD    Other Instructions Thank you for choosing [place] [place]!

## 2022-06-04 NOTE — Progress Notes (Signed)
HPI Mallory Sutton returns today for followup. She is a pleasant 62 yo woman with a h/o coarct s/p repair, and bicuspid aortic valve replacement, pulmonary HTN, lung disease with a DLCO of 44%. She has been bothered by PAF with a RVR and when she is out of rhythm, her dyspnea worsens at least one class. She underwent CT scanning demonstrating multiple lung nodules and is pending a PET scan. She denies syncope. She has been treated for her atrial fib with rhythmol. She has several episodes a week, lasting a few hours at a time.  She was noted to have a persistent left SVC on her CT scan.  Allergies  Allergen Reactions   Amiodarone Nausea Only   Penicillins Hives    Has patient had a PCN reaction causing immediate rash, facial/tongue/throat swelling, SOB or lightheadedness with hypotension: YES Has patient had a PCN reaction causing severe rash involving mucus membranes or skin necrosis: NO Has patient had a PCN reaction that required hospitalization NO Has patient had a PCN reaction occurring within the last 10 years: NO If all of the above answers are "NO", then may proceed with Cephalosporin use.      Current Outpatient Medications  Medication Sig Dispense Refill   albuterol (VENTOLIN HFA) 108 (90 Base) MCG/ACT inhaler Inhale 2 puffs into the lungs every 6 (six) hours as needed for wheezing or shortness of breath. 8 g 6   aspirin EC 81 MG tablet Take 81 mg by mouth daily.     Budeson-Glycopyrrol-Formoterol (BREZTRI AEROSPHERE) 160-9-4.8 MCG/ACT AERO Inhale into the lungs.     calcium-vitamin D (OSCAL WITH D) 500-5 MG-MCG tablet Take 1 tablet by mouth daily. 30 tablet 2   diazepam (VALIUM) 10 MG tablet Take 10 mg by mouth daily as needed for sleep.     feeding supplement (ENSURE ENLIVE / ENSURE PLUS) LIQD Take 237 mLs by mouth 2 (two) times daily between meals.     furosemide (LASIX) 40 MG tablet Take 80 mg by mouth daily as needed for fluid or edema.     gabapentin (NEURONTIN) 300 MG  capsule Take 300-600 mg by mouth See admin instructions. Take 300 mg in the morning, 300 mg in the afternoon, and 600 mg at bedtime     HYDROcodone-acetaminophen (NORCO/VICODIN) 5-325 MG tablet Take 2 tablets by mouth 2 (two) times daily.     ipratropium-albuterol (DUONEB) 0.5-2.5 (3) MG/3ML SOLN SMARTSIG:3 Milliliter(s) Via Nebulizer Twice Daily PRN     magnesium oxide (MAGOX 400) 400 (240 Mg) MG tablet Take 1 tablet (400 mg total) by mouth daily. 30 tablet 2   metoprolol succinate (TOPROL-XL) 25 MG 24 hr tablet TAKE 1 TABLET BY MOUTH ONCE DAILY. (Patient taking differently: Take 25 mg by mouth daily.) 90 tablet 3   Multiple Vitamin (MULTI-VITAMINS) TABS Take 1 tablet by mouth daily.      ondansetron (ZOFRAN ODT) 4 MG disintegrating tablet Take 1 tablet (4 mg total) by mouth every 8 (eight) hours as needed for nausea or vomiting. 20 tablet 0   potassium chloride SA (KLOR-CON M) 20 MEQ tablet Take 1 tablet (20 mEq total) by mouth 2 (two) times daily for 7 days, THEN 1 tablet (20 mEq total) daily for 7 days. Take 3 tablets daily when taking lasix. 90 tablet 3   predniSONE (DELTASONE) 20 MG tablet Take 1 tablet (20 mg total) by mouth daily with breakfast. 90 tablet 3   propafenone (RYTHMOL) 300 MG tablet Take 0.5 tablets (150  mg total) by mouth every 8 (eight) hours. 135 tablet 1   sertraline (ZOLOFT) 100 MG tablet Take 150 mg by mouth daily.     Turmeric 500 MG CAPS Take 500 mg by mouth 3 (three) times a week.     warfarin (COUMADIN) 1 MG tablet Take 3 tablets daily except 2 tablets on Sundays and Wednesdays 80 tablet 5   No current facility-administered medications for this visit.     Past Medical History:  Diagnosis Date   Aortic stenosis    ATRIAL ARRHYTHMIAS    Bicuspid aortic valve    CHF (congestive heart failure) (HCC)    COARCTATION OF AORTA    ENDOMETRIOSIS    Heart murmur    Paroxysmal atrial fibrillation (HCC)    Shortness of breath    THORACIC AORTIC ANEURYSM    Vasculitis  (HCC)     ROS:   All systems reviewed and negative except as noted in the HPI.   Past Surgical History:  Procedure Laterality Date   CARDIOVERSION N/A 08/29/2019   Procedure: CARDIOVERSION;  Surgeon: Arnoldo Lenis, MD;  Location: [facility];  Service: Endoscopy;  Laterality: N/A;   coarctation repair and residual restenosis     KNEE SURGERY     TEE WITHOUT CARDIOVERSION N/A 07/12/2019   Procedure: TRANSESOPHAGEAL ECHOCARDIOGRAM (TEE);  Surgeon: Sueanne Margarita, MD;  Location: [facility] ENDOSCOPY;  Service: Cardiovascular;  Laterality: N/A;   TEE WITHOUT CARDIOVERSION N/A 08/29/2019   Procedure: TRANSESOPHAGEAL ECHOCARDIOGRAM (TEE) WITH PROPOFOL;  Surgeon: Arnoldo Lenis, MD;  Location: [facility];  Service: Endoscopy;  Laterality: N/A;     Family History  Problem Relation Age of Onset   Suicidality Mother    Mental illness Mother    Drug abuse Brother    Healthy Daughter    Healthy Son      Social History   Socioeconomic History   Marital status: Married    Spouse name: Not on file   Number of children: Not on file   Years of education: Not on file   Highest education level: Not on file  Occupational History   Not on file  Tobacco Use   Smoking status: Never    Passive exposure: Past   Smokeless tobacco: Never  Vaping Use   Vaping Use: Never used  Substance and Sexual Activity   Alcohol use: Yes    Alcohol/week: 0.0 standard drinks of alcohol    Comment: 3 beers 3 nights a week   Drug use: No   Sexual activity: Never  Other Topics Concern   Not on file  Social History Narrative   Not on file   Social Determinants of Health   Financial Resource Strain: Not on file  Food Insecurity: Not on file  Transportation Needs: Not on file  Physical Activity: Not on file  Stress: Not on file  Social Connections: Not on file  Intimate Partner Violence: Not on file     BP (!) 142/90   Pulse 77   Ht '5\' 4"'$  (1.626 m)   Wt 104 lb (47.2 kg)   LMP 07/16/2013   SpO2 97%    BMI 17.85 kg/m   Physical Exam:  Well appearing thin woman, NAD HEENT: Unremarkable Neck:  No JVD, no thyromegally Lymphatics:  No adenopathy Back:  No CVA tenderness Lungs:  Clear with scattered wheezes HEART:  Regular rate rhythm, no murmurs, no rubs, no clicks Abd:  soft, positive bowel sounds, no organomegally, no rebound, no guarding Ext:  2 plus pulses, no edema, no cyanosis, no clubbing Skin:  No rashes no nodules Neuro:  CN II through XII intact, motor grossly intact  Assess/Plan: PAF - her symptoms may be a bit better controlled. She will continue Rhythmol. Her QT is a little long for dofetilide, but might be acceptable off of rhythmol. We discussed treatment options with the patient. I also discussed AV node ablation and PPM.  Chronic diastolic heart failure -she will continue her current meds.  Carleene Overlie Makyia Erxleben,MD

## 2022-06-08 ENCOUNTER — Other Ambulatory Visit: Payer: 59

## 2022-06-08 ENCOUNTER — Encounter: Payer: Self-pay | Admitting: Cardiovascular Disease

## 2022-06-10 ENCOUNTER — Telehealth: Payer: Self-pay | Admitting: Internal Medicine

## 2022-06-10 NOTE — Telephone Encounter (Signed)
Patient would like a call back from [employer] regarding her INR check.

## 2022-06-10 NOTE — Telephone Encounter (Signed)
Called pt back.  She wanted to let me know why she had to cancel and reschedule INR appt.  Has PET Scan scheduled tomorrow for concern of lymphoma by CT Scan.  Tried to provide encouragement to pt.  Has INR appt on Monday 06/15/22.

## 2022-06-11 ENCOUNTER — Encounter (HOSPITAL_COMMUNITY)
Admission: RE | Admit: 2022-06-11 | Discharge: 2022-06-11 | Disposition: A | Discharge status: Home / Self Care | Payer: Medicaid Other | Source: Ambulatory Visit | Attending: Pulmonary Disease | Admitting: Pulmonary Disease

## 2022-06-11 DIAGNOSIS — R591 Generalized enlarged lymph nodes: Secondary | ICD-10-CM | POA: Insufficient documentation

## 2022-06-18 ENCOUNTER — Encounter (HOSPITAL_COMMUNITY)
Admission: RE | Admit: 2022-06-18 | Discharge: 2022-06-18 | Disposition: A | Discharge status: Home / Self Care | Payer: Medicaid Other | Source: Ambulatory Visit | Attending: Pulmonary Disease | Admitting: Pulmonary Disease

## 2022-06-18 ENCOUNTER — Encounter: Payer: Self-pay | Admitting: Pulmonary Disease

## 2022-06-18 DIAGNOSIS — R591 Generalized enlarged lymph nodes: Secondary | ICD-10-CM | POA: Diagnosis present

## 2022-06-18 MED ORDER — FLUDEOXYGLUCOSE F - 18 (FDG) INJECTION
5.2800 | Freq: Once | INTRAVENOUS | Status: AC | PRN
  Administered 2022-06-18: 5.28 via INTRAVENOUS

## 2022-06-19 ENCOUNTER — Telehealth: Payer: Self-pay | Admitting: Pulmonary Disease

## 2022-06-19 DIAGNOSIS — R591 Generalized enlarged lymph nodes: Secondary | ICD-10-CM

## 2022-06-19 NOTE — Telephone Encounter (Signed)
Received MyChart message from patient requesting the results of her PET scan that was done on 2/15.   Dr. Erin Fulling, can you please advise? Thanks!

## 2022-06-19 NOTE — Telephone Encounter (Signed)
I spoke with patient over the phone regarding her CT PET scan results after she sent in [place] message.  I discussed with her that the scan is concerning for lymphoma and that she will need excisional biopsy by the general surgery team for confirmation. I also let her know we will be placing referral to heme/onc for evaluation.   Please place referrals to general surgery and Hematology/Oncology at [facility].  Thank you, Wille Glaser

## 2022-06-19 NOTE — Telephone Encounter (Signed)
Both referrals requested have been placed.

## 2022-06-22 ENCOUNTER — Other Ambulatory Visit: Payer: Self-pay

## 2022-06-22 MED ORDER — PROPAFENONE HCL ER 225 MG PO CP12: 225.0000 mg | ORAL_CAPSULE | Freq: Three times a day (TID) | ORAL | 3 refills | Status: DC

## 2022-06-23 ENCOUNTER — Ambulatory Visit (INDEPENDENT_AMBULATORY_CARE_PROVIDER_SITE_OTHER): Payer: Medicaid Other | Admitting: *Deleted

## 2022-06-23 ENCOUNTER — Ambulatory Visit: Payer: Medicaid Other | Attending: Nurse Practitioner | Admitting: Nurse Practitioner

## 2022-06-23 ENCOUNTER — Inpatient Hospital Stay (HOSPITAL_COMMUNITY)
Admission: EM | Admit: 2022-06-23 | Discharge: 2022-06-27 | DRG: 264 | Disposition: A | Discharge status: Home / Self Care | Payer: Medicaid Other | Source: Ambulatory Visit | Attending: Family Medicine | Admitting: Family Medicine

## 2022-06-23 ENCOUNTER — Other Ambulatory Visit (HOSPITAL_COMMUNITY): Payer: Medicaid Other

## 2022-06-23 ENCOUNTER — Inpatient Hospital Stay: Payer: Medicaid Other | Attending: Hematology | Admitting: Hematology

## 2022-06-23 ENCOUNTER — Encounter: Payer: Self-pay | Admitting: Hematology

## 2022-06-23 ENCOUNTER — Encounter: Payer: Self-pay | Admitting: Nurse Practitioner

## 2022-06-23 ENCOUNTER — Other Ambulatory Visit: Payer: Self-pay

## 2022-06-23 ENCOUNTER — Emergency Department (HOSPITAL_COMMUNITY): Payer: Medicaid Other

## 2022-06-23 ENCOUNTER — Encounter: Payer: Self-pay | Admitting: General Surgery

## 2022-06-23 ENCOUNTER — Other Ambulatory Visit: Payer: Medicaid Other

## 2022-06-23 ENCOUNTER — Telehealth: Payer: Self-pay

## 2022-06-23 ENCOUNTER — Ambulatory Visit: Payer: Medicaid Other | Admitting: General Surgery

## 2022-06-23 ENCOUNTER — Encounter (HOSPITAL_COMMUNITY): Payer: Self-pay | Admitting: Family Medicine

## 2022-06-23 VITALS — BP 138/94 | HR 130 | Temp 97.6°F | Resp 18 | Ht 64.0 in | Wt 105.0 lb

## 2022-06-23 VITALS — BP 119/78 | HR 147 | Temp 97.8°F | Resp 20 | Ht 64.0 in | Wt 105.6 lb

## 2022-06-23 VITALS — BP 104/60 | HR 132 | Ht 64.0 in | Wt 106.0 lb

## 2022-06-23 DIAGNOSIS — R591 Generalized enlarged lymph nodes: Secondary | ICD-10-CM | POA: Diagnosis present

## 2022-06-23 DIAGNOSIS — Z7982 Long term (current) use of aspirin: Secondary | ICD-10-CM

## 2022-06-23 DIAGNOSIS — R599 Enlarged lymph nodes, unspecified: Secondary | ICD-10-CM

## 2022-06-23 DIAGNOSIS — I7121 Aneurysm of the ascending aorta, without rupture: Secondary | ICD-10-CM | POA: Diagnosis present

## 2022-06-23 DIAGNOSIS — Z7951 Long term (current) use of inhaled steroids: Secondary | ICD-10-CM

## 2022-06-23 DIAGNOSIS — Z803 Family history of malignant neoplasm of breast: Secondary | ICD-10-CM | POA: Insufficient documentation

## 2022-06-23 DIAGNOSIS — Z1152 Encounter for screening for COVID-19: Secondary | ICD-10-CM

## 2022-06-23 DIAGNOSIS — Z5181 Encounter for therapeutic drug level monitoring: Secondary | ICD-10-CM

## 2022-06-23 DIAGNOSIS — F32A Depression, unspecified: Secondary | ICD-10-CM | POA: Diagnosis present

## 2022-06-23 DIAGNOSIS — Z952 Presence of prosthetic heart valve: Secondary | ICD-10-CM

## 2022-06-23 DIAGNOSIS — I4892 Unspecified atrial flutter: Secondary | ICD-10-CM | POA: Diagnosis present

## 2022-06-23 DIAGNOSIS — J449 Chronic obstructive pulmonary disease, unspecified: Secondary | ICD-10-CM | POA: Diagnosis present

## 2022-06-23 DIAGNOSIS — I480 Paroxysmal atrial fibrillation: Secondary | ICD-10-CM | POA: Diagnosis not present

## 2022-06-23 DIAGNOSIS — N809 Endometriosis, unspecified: Secondary | ICD-10-CM | POA: Diagnosis present

## 2022-06-23 DIAGNOSIS — E876 Hypokalemia: Secondary | ICD-10-CM | POA: Diagnosis present

## 2022-06-23 DIAGNOSIS — Z888 Allergy status to other drugs, medicaments and biological substances status: Secondary | ICD-10-CM

## 2022-06-23 DIAGNOSIS — R0603 Acute respiratory distress: Secondary | ICD-10-CM

## 2022-06-23 DIAGNOSIS — Q231 Congenital insufficiency of aortic valve: Secondary | ICD-10-CM | POA: Diagnosis not present

## 2022-06-23 DIAGNOSIS — C8595 Non-Hodgkin lymphoma, unspecified, lymph nodes of inguinal region and lower limb: Secondary | ICD-10-CM | POA: Diagnosis present

## 2022-06-23 DIAGNOSIS — D72829 Elevated white blood cell count, unspecified: Secondary | ICD-10-CM | POA: Diagnosis present

## 2022-06-23 DIAGNOSIS — J9611 Chronic respiratory failure with hypoxia: Secondary | ICD-10-CM | POA: Diagnosis present

## 2022-06-23 DIAGNOSIS — Z818 Family history of other mental and behavioral disorders: Secondary | ICD-10-CM

## 2022-06-23 DIAGNOSIS — Z01810 Encounter for preprocedural cardiovascular examination: Secondary | ICD-10-CM | POA: Diagnosis not present

## 2022-06-23 DIAGNOSIS — Z801 Family history of malignant neoplasm of trachea, bronchus and lung: Secondary | ICD-10-CM | POA: Insufficient documentation

## 2022-06-23 DIAGNOSIS — I4819 Other persistent atrial fibrillation: Principal | ICD-10-CM | POA: Diagnosis present

## 2022-06-23 DIAGNOSIS — Z880 Allergy status to penicillin: Secondary | ICD-10-CM

## 2022-06-23 DIAGNOSIS — D75839 Thrombocytosis, unspecified: Secondary | ICD-10-CM | POA: Diagnosis present

## 2022-06-23 DIAGNOSIS — N179 Acute kidney failure, unspecified: Secondary | ICD-10-CM | POA: Diagnosis present

## 2022-06-23 DIAGNOSIS — I4891 Unspecified atrial fibrillation: Secondary | ICD-10-CM | POA: Diagnosis not present

## 2022-06-23 DIAGNOSIS — Z7901 Long term (current) use of anticoagulants: Secondary | ICD-10-CM

## 2022-06-23 DIAGNOSIS — Z79899 Other long term (current) drug therapy: Secondary | ICD-10-CM

## 2022-06-23 DIAGNOSIS — I350 Nonrheumatic aortic (valve) stenosis: Secondary | ICD-10-CM | POA: Diagnosis present

## 2022-06-23 DIAGNOSIS — Q251 Coarctation of aorta: Secondary | ICD-10-CM

## 2022-06-23 DIAGNOSIS — Z813 Family history of other psychoactive substance abuse and dependence: Secondary | ICD-10-CM

## 2022-06-23 DIAGNOSIS — I110 Hypertensive heart disease with heart failure: Secondary | ICD-10-CM | POA: Diagnosis present

## 2022-06-23 DIAGNOSIS — D539 Nutritional anemia, unspecified: Secondary | ICD-10-CM | POA: Diagnosis present

## 2022-06-23 DIAGNOSIS — I776 Arteritis, unspecified: Secondary | ICD-10-CM

## 2022-06-23 DIAGNOSIS — K219 Gastro-esophageal reflux disease without esophagitis: Secondary | ICD-10-CM | POA: Diagnosis present

## 2022-06-23 DIAGNOSIS — F419 Anxiety disorder, unspecified: Secondary | ICD-10-CM | POA: Diagnosis present

## 2022-06-23 DIAGNOSIS — Z7952 Long term (current) use of systemic steroids: Secondary | ICD-10-CM

## 2022-06-23 DIAGNOSIS — I5032 Chronic diastolic (congestive) heart failure: Secondary | ICD-10-CM | POA: Diagnosis present

## 2022-06-23 DIAGNOSIS — Z79891 Long term (current) use of opiate analgesic: Secondary | ICD-10-CM

## 2022-06-23 LAB — HEPATIC FUNCTION PANEL
ALT: 27 U/L (ref 0–44)
AST: 35 U/L (ref 15–41)
Albumin: 4.1 g/dL (ref 3.5–5.0)
Alkaline Phosphatase: 86 U/L (ref 38–126)
Bilirubin, Direct: 0.1 mg/dL (ref 0.0–0.2)
Indirect Bilirubin: 0.7 mg/dL (ref 0.3–0.9)
Total Bilirubin: 0.8 mg/dL (ref 0.3–1.2)
Total Protein: 7.2 g/dL (ref 6.5–8.1)

## 2022-06-23 LAB — CBC WITH DIFFERENTIAL/PLATELET
Abs Immature Granulocytes: 0.19 10*3/uL — ABNORMAL HIGH (ref 0.00–0.07)
Basophils Absolute: 0.1 10*3/uL (ref 0.0–0.1)
Basophils Relative: 0 %
Eosinophils Absolute: 0 10*3/uL (ref 0.0–0.5)
Eosinophils Relative: 0 %
HCT: 37.9 % (ref 36.0–46.0)
Hemoglobin: 12.4 g/dL (ref 12.0–15.0)
Immature Granulocytes: 1 %
Lymphocytes Relative: 17 %
Lymphs Abs: 3.6 10*3/uL (ref 0.7–4.0)
MCH: 26.6 pg (ref 26.0–34.0)
MCHC: 32.7 g/dL (ref 30.0–36.0)
MCV: 81.2 fL (ref 80.0–100.0)
Monocytes Absolute: 0.9 10*3/uL (ref 0.1–1.0)
Monocytes Relative: 4 %
Neutro Abs: 17.1 10*3/uL — ABNORMAL HIGH (ref 1.7–7.7)
Neutrophils Relative %: 78 %
Platelets: 446 10*3/uL — ABNORMAL HIGH (ref 150–400)
RBC: 4.67 MIL/uL (ref 3.87–5.11)
RDW: 15.4 % (ref 11.5–15.5)
WBC: 21.8 10*3/uL — ABNORMAL HIGH (ref 4.0–10.5)
nRBC: 0 % (ref 0.0–0.2)

## 2022-06-23 LAB — HEPATITIS B SURFACE ANTIGEN: Hepatitis B Surface Ag: NONREACTIVE

## 2022-06-23 LAB — COMPREHENSIVE METABOLIC PANEL
ALT: 28 U/L (ref 0–44)
AST: 38 U/L (ref 15–41)
Albumin: 4.2 g/dL (ref 3.5–5.0)
Alkaline Phosphatase: 89 U/L (ref 38–126)
Anion gap: 13 (ref 5–15)
BUN: 19 mg/dL (ref 6–20)
CO2: 26 mmol/L (ref 22–32)
Calcium: 9.7 mg/dL (ref 8.9–10.3)
Chloride: 98 mmol/L (ref 98–111)
Creatinine, Ser: 1.04 mg/dL — ABNORMAL HIGH (ref 0.44–1.00)
GFR, Estimated: >60 mL/min (ref >60)
Glucose, Bld: 113 mg/dL — ABNORMAL HIGH (ref 70–99)
Potassium: 3.2 mmol/L — ABNORMAL LOW (ref 3.5–5.1)
Sodium: 137 mmol/L (ref 135–145)
Total Bilirubin: 0.6 mg/dL (ref 0.3–1.2)
Total Protein: 7.4 g/dL (ref 6.5–8.1)

## 2022-06-23 LAB — LACTATE DEHYDROGENASE: LDH: 200 U/L — ABNORMAL HIGH (ref 98–192)

## 2022-06-23 LAB — BASIC METABOLIC PANEL
Anion gap: 14 (ref 5–15)
BUN: 21 mg/dL — ABNORMAL HIGH (ref 6–20)
CO2: 27 mmol/L (ref 22–32)
Calcium: 9.3 mg/dL (ref 8.9–10.3)
Chloride: 96 mmol/L — ABNORMAL LOW (ref 98–111)
Creatinine, Ser: 1.03 mg/dL — ABNORMAL HIGH (ref 0.44–1.00)
GFR, Estimated: >60 mL/min (ref >60)
Glucose, Bld: 105 mg/dL — ABNORMAL HIGH (ref 70–99)
Potassium: 2.8 mmol/L — ABNORMAL LOW (ref 3.5–5.1)
Sodium: 137 mmol/L (ref 135–145)

## 2022-06-23 LAB — CBC
HCT: 38.5 % (ref 36.0–46.0)
Hemoglobin: 12.1 g/dL (ref 12.0–15.0)
MCH: 26.1 pg (ref 26.0–34.0)
MCHC: 31.4 g/dL (ref 30.0–36.0)
MCV: 83.2 fL (ref 80.0–100.0)
Platelets: 466 10*3/uL — ABNORMAL HIGH (ref 150–400)
RBC: 4.63 MIL/uL (ref 3.87–5.11)
RDW: 15.6 % — ABNORMAL HIGH (ref 11.5–15.5)
WBC: 19.5 10*3/uL — ABNORMAL HIGH (ref 4.0–10.5)
nRBC: 0 % (ref 0.0–0.2)

## 2022-06-23 LAB — TROPONIN I (HIGH SENSITIVITY)
Troponin I (High Sensitivity): 14 ng/L (ref <18)
Troponin I (High Sensitivity): 11 ng/L (ref <18)

## 2022-06-23 LAB — RESP PANEL BY RT-PCR (RSV, FLU A&B, COVID)  RVPGX2
Influenza A by PCR: NEGATIVE
Influenza B by PCR: NEGATIVE
Resp Syncytial Virus by PCR: NEGATIVE
SARS Coronavirus 2 by RT PCR: NEGATIVE

## 2022-06-23 LAB — LACTIC ACID, PLASMA
Lactic Acid, Venous: 1.8 mmol/L (ref 0.5–1.9)
Lactic Acid, Venous: 1.3 mmol/L (ref 0.5–1.9)

## 2022-06-23 LAB — D-DIMER, QUANTITATIVE: D-Dimer, Quant: <0.27 ug/mL-FEU (ref 0.00–0.50)

## 2022-06-23 LAB — HEPATITIS B CORE ANTIBODY, TOTAL: Hep B Core Total Ab: NONREACTIVE

## 2022-06-23 LAB — POCT INR: INR: 2.9 (ref 2.0–3.0)

## 2022-06-23 LAB — URIC ACID: Uric Acid, Serum: 6 mg/dL (ref 2.5–7.1)

## 2022-06-23 LAB — HEPATITIS B SURFACE ANTIBODY,QUALITATIVE: Hep B S Ab: NONREACTIVE

## 2022-06-23 LAB — CBG MONITORING, ED: Glucose-Capillary: 112 mg/dL — ABNORMAL HIGH (ref 70–99)

## 2022-06-23 LAB — HEPATITIS C ANTIBODY: HCV Ab: NONREACTIVE

## 2022-06-23 LAB — PROTIME-INR
INR: 2 — ABNORMAL HIGH (ref 0.8–1.2)
Prothrombin Time: 22.6 seconds — ABNORMAL HIGH (ref 11.4–15.2)

## 2022-06-23 LAB — MAGNESIUM: Magnesium: 2.4 mg/dL (ref 1.7–2.4)

## 2022-06-23 MED ORDER — FENTANYL CITRATE PF 50 MCG/ML IJ SOSY
50.0000 ug | PREFILLED_SYRINGE | Freq: Once | INTRAMUSCULAR | Status: AC
  Administered 2022-06-23: 50 ug via INTRAVENOUS
  Filled 2022-06-23: qty 1

## 2022-06-23 MED ORDER — POTASSIUM CHLORIDE CRYS ER 20 MEQ PO TBCR
40.0000 meq | EXTENDED_RELEASE_TABLET | Freq: Once | ORAL | Status: AC
  Administered 2022-06-23: 40 meq via ORAL
  Filled 2022-06-23: qty 2

## 2022-06-23 MED ORDER — ONDANSETRON HCL 4 MG/2ML IJ SOLN
4.0000 mg | Freq: Once | INTRAMUSCULAR | Status: AC
  Administered 2022-06-23: 4 mg via INTRAVENOUS
  Filled 2022-06-23: qty 2

## 2022-06-23 MED ORDER — POTASSIUM CHLORIDE 10 MEQ/100ML IV SOLN
10.0000 meq | INTRAVENOUS | Status: AC
  Administered 2022-06-23 – 2022-06-24 (×4): 10 meq via INTRAVENOUS
  Filled 2022-06-23 (×4): qty 100

## 2022-06-23 MED ORDER — DILTIAZEM HCL-DEXTROSE 125-5 MG/125ML-% IV SOLN (PREMIX)
5.0000 mg/h | INTRAVENOUS | Status: DC
  Administered 2022-06-23: 5 mg/h via INTRAVENOUS
  Filled 2022-06-23: qty 125

## 2022-06-23 MED ORDER — WARFARIN SODIUM 1 MG PO TABS: ORAL_TABLET | ORAL | 5 refills | Status: DC

## 2022-06-23 MED ORDER — ETOMIDATE 2 MG/ML IV SOLN
0.1000 mg/kg | Freq: Once | INTRAVENOUS | Status: AC
  Administered 2022-06-23: 4.82 mg via INTRAVENOUS
  Filled 2022-06-23: qty 10

## 2022-06-23 MED ORDER — DILTIAZEM LOAD VIA INFUSION
15.0000 mg | Freq: Once | INTRAVENOUS | Status: AC
  Administered 2022-06-23: 15 mg via INTRAVENOUS
  Filled 2022-06-23: qty 15

## 2022-06-23 MED ORDER — LACTATED RINGERS IV SOLN: INTRAVENOUS | Status: DC

## 2022-06-23 NOTE — Progress Notes (Signed)
Cardiology Office Note:    Date:  06/23/2022   ID:  Mallory Sutton, DOB 09-28-1960, MRN [id number]  PCP:  Mallory Sutton, [facility] Cardiologist:  Jenkins Rouge, MD     Referring MD: Mallory Nose, FNP   CC: Here for follow-up  History of Present Illness:    Mallory Sutton is a 62 y.o. 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  She has been followed by Dr. Lovena Le with EP.  In 2019, she developed neuropathy and painful questionable vascular rash in legs, received short course of steroids.  Ultrasound of right lower extremity revealed complex cystic lesion in posterior upper thigh.  Echocardiogram in 2021 showed normal EF, no AR and mean gradient across AVR 14 mmHg.  She had not had recent CT/MRI imaging of her coarctation but MLD was < 1 cm in past.  Repeat echocardiogram in March 2023 showed normal EF, normal RV and mild MR, mean gradient across AVR 29 mmHg.   Was hospitalized in March 2023 with hypoxemic respiratory failure COPD.  Discharged on Lasix 80 mg daily.  Seen by Dr. Erin Fulling in June 2023 PFTs showed severe obstruction and moderate diffusion defect, still was on 2 L of oxygen.  Continue to have difficulty with vasculitis/pain in arms and legs and dyspnea.  DLCO < 50%, FEV1 < 1, then began to use 3 L of oxygen via nasal cannula.  Had ANCA, histone Ab, and ANA tests that were negative.   Echo 03/2022 revaled EF 65-70%, no WMA's, moderade LVH, grade 2 DD, descending aorta was not well defined, turbulent flow and gradient of 47 mm Hg noted suggesting restenosis,  recommended to consider CTA for further evaluation, normal PASP, mild MR, mild to mod AR, with mean gradient measuring 28.0 mmHg, similar to prior study.   Last seen by Dr. [name] 04/2022.  Patient was emotional in office.  Has lost 20 pounds since April 2023.  Frustrated d/t lack of diagnosis with her lungs.  She reported cramping and locking up in her hands, noted when potassium was low. She was started on Prednisone 20 mg daily, and recommended to take Lasix daily PRN along with potassium supplementation.   Saw Dr. Lovena Le 06/04/2022. Reported being bothered by PAF with RVR. When out of rhythm, dyspnea worsens. She was pending PET scan d/t abnormal CT scan of chest showing multiple lung nodules. Despite taking Rythmol, she noted several episodes, lasting few hours at a time.   Today she presents with her daughter who is also her historian. She has been to four doctor's appts today. She is scheduled to have lymph node biopsy next Friday. Pt reports chest pain d/t her current breathing status. Does note dyspnea, wears 3 L of O2 continuously. For past week, pt says heart rate has been fast, has not been controlled. Has been having ongoing palpitations since last Thursday evening, has been consistent and worse over time. Denies any syncope, presyncope, dizziness, orthopnea, PND, swelling or significant weight changes, acute bleeding, or claudication.    Past Medical History:  Diagnosis Date   Aortic stenosis  ATRIAL ARRHYTHMIAS    Bicuspid aortic valve    CHF (congestive heart failure) (HCC)    COARCTATION OF AORTA    ENDOMETRIOSIS    Heart murmur    Paroxysmal atrial fibrillation (HCC)    Shortness of breath    THORACIC AORTIC ANEURYSM    Vasculitis ([place])     Past Surgical History:  Procedure Laterality Date   CARDIOVERSION N/A 08/29/2019   Procedure: CARDIOVERSION;  Surgeon: Arnoldo Lenis, MD;  Location: [facility];  Service: Endoscopy;  Laterality: N/A;   coarctation repair and residual  restenosis     KNEE SURGERY     TEE WITHOUT CARDIOVERSION N/A 07/12/2019   Procedure: TRANSESOPHAGEAL ECHOCARDIOGRAM (TEE);  Surgeon: Sueanne Margarita, MD;  Location: [facility] ENDOSCOPY;  Service: Cardiovascular;  Laterality: N/A;   TEE WITHOUT CARDIOVERSION N/A 08/29/2019   Procedure: TRANSESOPHAGEAL ECHOCARDIOGRAM (TEE) WITH PROPOFOL;  Surgeon: Arnoldo Lenis, MD;  Location: [facility];  Service: Endoscopy;  Laterality: N/A;    Current Medications: Current Meds  Medication Sig   albuterol (VENTOLIN HFA) 108 (90 Base) MCG/ACT inhaler Inhale 2 puffs into the lungs every 6 (six) hours as needed for wheezing or shortness of breath.   aspirin EC 81 MG tablet Take 81 mg by mouth daily.   Budeson-Glycopyrrol-Formoterol (BREZTRI AEROSPHERE) 160-9-4.8 MCG/ACT AERO Inhale into the lungs.   calcium-vitamin D (OSCAL WITH D) 500-5 MG-MCG tablet Take 1 tablet by mouth daily.   diazepam (VALIUM) 10 MG tablet Take 10 mg by mouth daily as needed for sleep.   feeding supplement (ENSURE ENLIVE / ENSURE PLUS) LIQD Take 237 mLs by mouth 2 (two) times daily between meals.   furosemide (LASIX) 40 MG tablet Take 80 mg by mouth daily as needed for fluid or edema.   gabapentin (NEURONTIN) 300 MG capsule Take 300-600 mg by mouth See admin instructions. Take 300 mg in the morning, 300 mg in the afternoon, and 600 mg at bedtime   HYDROcodone-acetaminophen (NORCO/VICODIN) 5-325 MG tablet Take 2 tablets by mouth 2 (two) times daily.   ipratropium-albuterol (DUONEB) 0.5-2.5 (3) MG/3ML SOLN SMARTSIG:3 Milliliter(s) Via Nebulizer Twice Daily PRN   magnesium oxide (MAGOX 400) 400 (240 Mg) MG tablet Take 1 tablet (400 mg total) by mouth daily.   metoprolol succinate (TOPROL-XL) 25 MG 24 hr tablet TAKE 1 TABLET BY MOUTH ONCE DAILY. (Patient taking differently: Take 12.5 mg by mouth daily.)   Multiple Vitamin (MULTI-VITAMINS) TABS Take 1 tablet by mouth daily.    ondansetron (ZOFRAN ODT) 4 MG disintegrating tablet Take 1 tablet (4 mg  total) by mouth every 8 (eight) hours as needed for nausea or vomiting.   potassium chloride SA (KLOR-CON M) 20 MEQ tablet Take 1 tablet (20 mEq total) by mouth 2 (two) times daily for 7 days, THEN 1 tablet (20 mEq total) daily for 7 days. Take 3 tablets daily when taking lasix.   predniSONE (DELTASONE) 20 MG tablet Take 1 tablet (20 mg total) by mouth daily with breakfast.   propafenone (RYTHMOL SR) 225 MG 12 hr capsule Take 1 capsule (225 mg total) by mouth every 8 (eight) hours.   Sertraline HCl 150 MG CAPS Take 150 mg by mouth daily.   Turmeric 500 MG CAPS Take 500 mg by mouth 3 (three) times a week.    warfarin (COUMADIN) 1 MG tablet Take 3 tablets daily except 2 tablets on Sundays and Wednesdays     Allergies:   Amiodarone and Penicillins   Social History   Socioeconomic History  Marital status: Married    Spouse name: Not on file   Number of children: Not on file   Years of education: Not on file   Highest education level: Not on file  Occupational History   Not on file  Tobacco Use   Smoking status: Never    Passive exposure: Past   Smokeless tobacco: Never  Vaping Use   Vaping Use: Never used  Substance and Sexual Activity   Alcohol use: Yes    Alcohol/week: 0.0 standard drinks of alcohol    Comment: 3 beers 3 nights a week   Drug use: No   Sexual activity: Never  Other Topics Concern   Not on file  Social History Narrative   Not on file   Social Determinants of Health   Financial Resource Strain: Not on file  Food Insecurity: Food Insecurity Present (06/23/2022)   Hunger Vital Sign    Worried About Running Out of Food in the Last Year: Sometimes true    Ran Out of Food in the Last Year: Sometimes true  Transportation Needs: No Transportation Needs (06/23/2022)   PRAPARE - Hydrologist (Medical): No    Lack of Transportation (Non-Medical): No  Physical Activity: Not on file  Stress: Not on file  Social Connections: Not on file      Family History: The patient's family history includes Drug abuse in her brother; Healthy in her daughter and son; Mental illness in her mother; Suicidality in her mother.  ROS:   Please see the history of present illness.     All other systems reviewed and are negative.  EKGs/Labs/Other Studies Reviewed:    The following studies were reviewed today:   EKG:  EKG is  ordered today.  The ekg ordered today demonstrates A-fib/A-flutter with RVR, 132 bpm, LVH with repolarization abnormality, no acute ischemic changes.   Echo bubble study limited on 05/21/2022:   1. Left ventricular ejection fraction, by estimation, is 60 to 65%. The  left ventricle has normal function. The left ventricle has no regional  wall motion abnormalities. There is moderate left ventricular hypertrophy.   2. Right ventricular systolic function is normal. The right ventricular  size is normal.   3. Agitated saline contrast bubble study was negative, with no evidence  of any interatrial shunt.   4. Limited echo with bubble study to evaluate for intracardiac shunting   Echocardiogram on 03/19/2022:  1. Left ventricular ejection fraction, by estimation, is 65 to 70%. The  left ventricle has normal function. The left ventricle has no regional  wall motion abnormalities. There is moderate left ventricular hypertrophy.  Left ventricular diastolic  parameters are consistent with Grade II diastolic dysfunction  (pseudonormalization). The average left ventricular global longitudinal  strain is -22.1 %. The global longitudinal strain is normal.   2. History of surgical aortic coactation repair in childhood. Descedning  aortic not well defined, but there is turbulent flow and gradient of 47  mmHg noted suggesting restenosis. Consider CTA for further evaluation.   3. Right ventricular systolic function is normal. The right ventricular  size is normal. There is normal pulmonary artery systolic pressure. The  estimated  right ventricular systolic pressure is 123XX123 mmHg.   4. Left atrial size was mildly dilated.   5. The mitral valve is mildly degenerative. Mild mitral valve  regurgitation.   6. The aortic valve has been repaired/replaced. Aortic valve  regurgitation is mild to moderate, cannot exclude paravaluvlar component.  There is a 19 mm On-X valve present in the aortic position. Aortic valve  mean gradient measures 28.0 mmHg and similar  to prior study. Dimentionless index 0.51 argues against prosthetic  stenosis.   7. The inferior vena cava is normal in size with greater than 50%  respiratory variability, suggesting right atrial pressure of 3 mmHg.   Comparison(s): Prior images reviewed side by side. See above discussion.  TEE on 08/29/2019:  1. Left ventricular ejection fraction, by estimation, is 60 to 65%. The  left ventricle has normal function. The left ventricle has no regional  wall motion abnormalities.   2. Right ventricular systolic function is normal. The right ventricular  size is normal.   3. LA appendage with decreased emptying velocity of 20 m/s. Mild smoke  but no evidence thrombus. Definity contrast was used to help visualize  appendage, no signs of thrombus noted with use of contrast. . Left atrial  size was moderately dilated. No left  atrial/left atrial appendage thrombus was detected.   4. Right atrial size was mildly dilated.   5. The mitral valve is normal in structure. Mild mitral valve  regurgitation. No evidence of mitral stenosis.   6. The aortic valve has been repaired/replaced. Aortic valve  regurgitation is not visualized. No aortic stenosis is present.   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  Gradient by TTE are  mean of 15 mmHg and peak of 30 mmHg.   Cardiac MRI on 05/03/2002:  IMPRESSION  1. RECOARCTATION OF THE DESCENDING THORACIC AORTA WITH MINIMAL LUMINAL DIAMETER IN THE 9 TO 11 MM  RANGE.  THE PEAK VELOCITY THROUGH THE COARCTATION AREA WAS IN THE 2.9 TO 3.0 METERS PER SECOND  RANGE GIVEN AN ESTIMATED PEAK GRADIENT OF 36 MMHG.  2.  MODERATE ASCENDING THORACIC AORTIC ANEURYSM MEASURING A MAXIMAL DIAMETER IN THE 42 MM RANGE.  NO EVIDENCE OF DISSECTION.  3.  BICUSPID AORTIC VALVE WITH MILD AORTIC INSUFFICIENCY AND MILD AORTIC STENOSIS.  4.  MILD LEFT VENTRICULAR CAVITY ENLARGEMENT WITHOUT SIGNIFICANT HYPERTROPHY.  EJECTION FRACTION  79% WITH A STROKE VOLUME OF 103 CC.  5.  THE PATIENT DID NOT APPEAR TO HAVE ANY SIGNIFICANT MITRAL INSUFFICIENCY OR MITRAL VALVE DISEASE.  6.  PATIENT TOLERATED THE PROCEDURE WELL.  Recent Labs: 07/20/2021: B Natriuretic Peptide 437.0 02/03/2022: Magnesium 2.7 06/23/2022: ALT 28; BUN 19; Creatinine, Ser 1.04; Hemoglobin 12.4; Platelets 446; Potassium 3.2; Sodium 137  Recent Lipid Panel    Component Value Date/Time   CHOL 144 01/23/2015 0913   TRIG 87 01/23/2015 0913   HDL 44 01/23/2015 0913   CHOLHDL 3.3 01/23/2015 0913   VLDL 17 01/23/2015 0913   LDLCALC 83 01/23/2015 0913     Risk Assessment/Calculations:    CHA2DS2-VASc Score = 3   This indicates a 3.2% annual risk of stroke. The  patient's score is based upon: CHF History: 1 HTN History: 0 Diabetes History: 0 Stroke History: 0 Vascular Disease History: 1 Age Score: 0 Gender Score: 1   Physical Exam:    VS:  BP 104/60   Pulse (!) 132   Ht 5' 4"$  (1.626 m)   Wt 106 lb (48.1 kg)   LMP 07/16/2013   SpO2 99%   BMI 18.19 kg/m      Wt Readings from Last 3 Encounters:  06/23/22 106 lb (48.1 kg)  06/23/22 105 lb 9.6 oz (47.9 kg)  06/23/22 105 lb (47.6 kg)     GEN: Thin, 62 y.o. female, ill-appearing, in acute distress HEENT: Normal NECK: No JVD; No carotid bruits CARDIAC: S1/S2, irregular rhythm and fast rate, no murmur, rubs or gallops noted, 2+ pulses, vasculitis noted RESPIRATORY:  Diminished, scattered crackles, without wheezing or rhonchi  MUSCULOSKELETAL:  No edema; No deformity  SKIN: Warm and dry NEUROLOGIC:  Alert and oriented x 3 PSYCHIATRIC:  Normal affect   ASSESSMENT:    1. Paroxysmal atrial fibrillation (HCC)   2. Acute respiratory distress   3. Pre-operative cardiovascular examination    PLAN:    In order of problems listed above:  PAF/A-flutter with RVR Acute respiratory distress Pre-op eval  Patient is a 62 y.o. female with PMH of congenital heart disease (coarctation repair age 18 via left thoracotomy), Bicuspid aortic valve and aortic aneurysm, status post aortic valve and arch repair in 2016, persistent left sided SVC, vasculitis (follows Rheumatology), Lung disease with DLCO < 44%, pulmonary HTN, COPD (follows pulmonology), and PAF and MAZE with LAA occlusion (follows EP). Presents to office today for evaluation. Scheduled to have lymph node biopsy next Friday. However, EKG shows A-fib/A-flutter with RVR, HR 136. HR has been poorly controlled since last week. Physical exam as mentioned above.   Case was discussed with patient's primary cardiologist and DOD (Dr. [name]) who agreed with my recommendation to send patient to ED for evaluation. Because INR's have been therapeutic, would be a good candidate for DCCV. I have given report to ED physician, Dr. Godfrey Pick, who verbalized understanding and have sent him a message conveying this information.   Upon arrival to the ED, I recommend the following be done/obtained: 12 lead ECG, vital signs, and administer 3 L of continuous  oxygen (home O2) Obtain the following labs: D-dimer, Respiratory panel, CBC, CMET, electrolytes, and lactate Imaging including 2 view CXR and echocardiogram Discuss risks vs benefits of DCCV to restore NSR or administer IV BB, Cardizem, and/or Digoxin for rate control therapy Admit for further observation and consult cardiology Discharge when HR is controlled and in stable condition and arrange EP consultation to see Dr. Myles Gip in [place], [place] (currently sees Dr. Lovena Le in [place]). Follow-up with General Cardiology in 1-2 weeks post d/c once in stable condition.   Pre-op eval: RCRI is 0.9% of risk of major cardiac event and DASI is 4.64, activity is limited d/t current lung status and breathing status, chronic for her. She does not require any further cardiac testing prior to her upcoming lymph node biopsy. According to Dr. [name] (patient's primary cardiologist), she can hold Coumadin likely 3 days before any biopsy and INR should be < 2. She has been given instructions per our Coumadin Clinic RN Edrick Oh, RN) regarding this. However, pt requires ED eval at this time (see above).     Medication Adjustments/Labs and Tests Ordered: Current medicines are reviewed at length with the patient today.  Concerns  regarding medicines are outlined above.  Orders Placed This Encounter  Procedures   EKG 12-Lead   No orders of the defined types were placed in this encounter.   Patient Instructions  Medication Instructions:  Your physician recommends that you continue on your current medications as directed. Please refer to the Current Medication list given to you today.  Labwork: none  Testing/Procedures: none  Follow-Up: Your physician recommends that you schedule a follow-up appointment in: after ED evaluation  Any Other Special Instructions Will Be Listed Below (If Applicable).  If you need a refill on your cardiac medications before your next appointment, please call your pharmacy.    Signed, Finis Bud, NP  06/23/2022 5:21 PM    [place]

## 2022-06-23 NOTE — ED Triage Notes (Signed)
Pt has been in Afib since Thursday, was seen at cardiologist today, recommended to go to the ER to get monitored prior to changing anti arrhythmic medications. On chronic 3LNC. H/O Afib takes Warfarin.

## 2022-06-23 NOTE — Progress Notes (Unsigned)
[facility] History and Physical  Reason for Referral:*** Referring Physician: ***  Chief Complaint   Enlarged lymph nodes     Mallory Sutton is a 62 y.o. female.  HPI: ***.  The *** started *** and has had a duration of ***.  It is associated with ***.  The *** is improved with ***, and is made worse with ***.    Quality*** Context***  Past Medical History:  Diagnosis Date   Aortic stenosis    ATRIAL ARRHYTHMIAS    Bicuspid aortic valve    CHF (congestive heart failure) (HCC)    COARCTATION OF AORTA    ENDOMETRIOSIS    Heart murmur    Paroxysmal atrial fibrillation (HCC)    Shortness of breath    THORACIC AORTIC ANEURYSM    Vasculitis (HCC)     Past Surgical History:  Procedure Laterality Date   CARDIOVERSION N/A 08/29/2019   Procedure: CARDIOVERSION;  Surgeon: Antoine Poche, MD;  Location: [facility];  Service: Endoscopy;  Laterality: N/A;   coarctation repair and residual restenosis     KNEE SURGERY     TEE WITHOUT CARDIOVERSION N/A 07/12/2019   Procedure: TRANSESOPHAGEAL ECHOCARDIOGRAM (TEE);  Surgeon: Quintella Reichert, MD;  Location[facility] ENDOSCOPY;  Service: Cardiovascular;  Laterality: N/A;   TEE WITHOUT CARDIOVERSION N/A 08/29/2019   Procedure: TRANSESOPHAGEAL ECHOCARDIOGRAM (TEE) WITH PROPOFOL;  Surgeon: Antoine Poche, MD;  Location: [facility];  Service: Endoscopy;  Laterality: N/A;    Family History  Problem Relation Age of Onset   Suicidality Mother    Mental illness Mother    Drug abuse Brother    Healthy Daughter    Healthy Son     Social History   Tobacco Use   Smoking status: Never    Passive exposure: Past   Smokeless tobacco: Never  Vaping Use   Vaping Use: Never used  Substance Use Topics   Alcohol use: Yes    Alcohol/week: 0.0 standard drinks of alcohol    Comment: 3 beers 3 nights a week   Drug use: No    Medications: {medication reviewed/display:3041432} Allergies as of 06/23/2022       Reactions   Amiodarone  Nausea Only   Penicillins Hives   Has patient had a PCN reaction causing immediate rash, facial/tongue/throat swelling, SOB or lightheadedness with hypotension: YES Has patient had a PCN reaction causing severe rash involving mucus membranes or skin necrosis: NO Has patient had a PCN reaction that required hospitalization NO Has patient had a PCN reaction occurring within the last 10 years: NO If all of the above answers are "NO", then may proceed with Cephalosporin use.        Medication List        Accurate as of June 23, 2022 10:58 AM. If you have any questions, ask your nurse or doctor.          albuterol 108 (90 Base) MCG/ACT inhaler Commonly known as: VENTOLIN HFA Inhale 2 puffs into the lungs every 6 (six) hours as needed for wheezing or shortness of breath.   aspirin EC 81 MG tablet Take 81 mg by mouth daily.   Breztri Aerosphere 160-9-4.8 MCG/ACT Aero Generic drug: Budeson-Glycopyrrol-Formoterol Inhale into the lungs.   calcium-vitamin D 500-5 MG-MCG tablet Commonly known as: OSCAL WITH D Take 1 tablet by mouth daily.   diazepam 10 MG tablet Commonly known as: VALIUM Take 10 mg by mouth daily as needed for sleep.   feeding supplement Liqd Take  237 mLs by mouth 2 (two) times daily between meals.   furosemide 40 MG tablet Commonly known as: LASIX Take 80 mg by mouth daily as needed for fluid or edema.   gabapentin 300 MG capsule Commonly known as: NEURONTIN Take 300-600 mg by mouth See admin instructions. Take 300 mg in the morning, 300 mg in the afternoon, and 600 mg at bedtime   HYDROcodone-acetaminophen 5-325 MG tablet Commonly known as: NORCO/VICODIN Take 2 tablets by mouth 2 (two) times daily.   ipratropium-albuterol 0.5-2.5 (3) MG/3ML Soln Commonly known as: DUONEB SMARTSIG:3 Milliliter(s) Via Nebulizer Twice Daily PRN   magnesium oxide 400 (240 Mg) MG tablet Commonly known as: MagOx 400 Take 1 tablet (400 mg total) by mouth daily.    metoprolol succinate 25 MG 24 hr tablet Commonly known as: TOPROL-XL TAKE 1 TABLET BY MOUTH ONCE DAILY. What changed: how much to take   Multi-Vitamins Tabs Take 1 tablet by mouth daily.   ondansetron 4 MG disintegrating tablet Commonly known as: Zofran ODT Take 1 tablet (4 mg total) by mouth every 8 (eight) hours as needed for nausea or vomiting.   potassium chloride SA 20 MEQ tablet Commonly known as: KLOR-CON M Take 1 tablet (20 mEq total) by mouth 2 (two) times daily for 7 days, THEN 1 tablet (20 mEq total) daily for 7 days. Take 3 tablets daily when taking lasix. Start taking on: April 30, 2022   predniSONE 20 MG tablet Commonly known as: DELTASONE Take 1 tablet (20 mg total) by mouth daily with breakfast.   propafenone 225 MG 12 hr capsule Commonly known as: Rythmol SR Take 1 capsule (225 mg total) by mouth every 8 (eight) hours.   sertraline 100 MG tablet Commonly known as: ZOLOFT Take 150 mg by mouth daily.   Turmeric 500 MG Caps Take 500 mg by mouth 3 (three) times a week.   warfarin 1 MG tablet Commonly known as: COUMADIN Take as directed by the anticoagulation clinic. If you are unsure how to take this medication, talk to your nurse or doctor. Original instructions: Take 3 tablets daily except 2 tablets on Sundays and Wednesdays         ROS:  {Review of Systems:30496}  Blood pressure (!) 138/94, pulse (!) 130, temperature 97.6 F (36.4 C), temperature source Oral, resp. rate 18, height 5\' 4"  (1.626 m), weight 105 lb (47.6 kg), last menstrual period 07/16/2013, SpO2 100 %. Physical Exam  Results: No results found for this or any previous visit (from the past 48 hour(s)).  No results found.   Assessment & Plan:  LAGENA HABA is a 62 y.o. female with *** -*** -*** -Follow up ***  All questions were answered to the satisfaction of the patient and family***.  The risk and benefits of *** were discussed including but not limited to ***.  After  careful consideration, Mallory Sutton has decided to ***.   Future Appointments  Date Time Provider Department Center  06/23/2022  1:30 PM Doreatha Massed, MD [facility] None  06/23/2022  3:00 PM [facility]  06/23/2022  3:30 PM Sharlene Dory, NP [facility]  06/25/2022 11:20 AM Dimple Casey, Jamesetta Orleans, MD [facility] None  08/24/2022 10:45 AM Wendall Stade, MD [facility] Freda Munro 06/23/2022, 10:58 AM

## 2022-06-23 NOTE — Patient Instructions (Addendum)
Medication Instructions:  Your physician recommends that you continue on your current medications as directed. Please refer to the Current Medication list given to you today.  Labwork: none  Testing/Procedures: none  Follow-Up: Your physician recommends that you schedule a follow-up appointment in: after ED evaluation  Any Other Special Instructions Will Be Listed Below (If Applicable).  If you need a refill on your cardiac medications before your next appointment, please call your pharmacy.

## 2022-06-23 NOTE — ED Notes (Signed)
72m etomidate given in addition to the ordered amount per Dr. DDoren Custard 120J used to cardiovert at this time. Dr. DDoren Custarddelivered shock. Pt heart rate down to 90.

## 2022-06-23 NOTE — Progress Notes (Signed)
RT present for cardioversion. Patient tolerated procedure well with no adverse events noted. VS remained stable throughout.

## 2022-06-23 NOTE — Patient Instructions (Signed)
Scheduled for lymph node Bx on 07/03/22  Will Hold warfarin 2 days before procedure and restart day after procedure taking 4 tablets on Saturday the resuming 3 tablets daily except 4 tablets on Fridays Recheck on 07/14/22 Continue Ensure 2 daily per MD

## 2022-06-23 NOTE — Patient Instructions (Addendum)
[facility]  Discharge Instructions  You were seen and examined today by Dr. Delton Coombes. Dr. Delton Coombes is a medical oncologist, meaning that he specializes in the treatment of cancer diagnoses. Dr. Delton Coombes discussed your past medical history, family history of cancers, and the events that led to you being here today.  You were referred to Dr. Delton Coombes due to an abnormal PET scan which revealed enlarged lymph nodes.  Dr. Delton Coombes has recommended additional labs today. Please proceed with biopsy when it is scheduled, those results will take approximately a week.  Follow-up with Dr. Delton Coombes one week after biopsy.  Thank you for choosing [facility] to provide your oncology and hematology care.   To afford each patient quality time with our provider, please arrive at least 15 minutes before your scheduled appointment time. You may need to reschedule your appointment if you arrive late (10 or more minutes). Arriving late affects you and other patients whose appointments are after yours.  Also, if you miss three or more appointments without notifying the office, you may be dismissed from the clinic at the provider's discretion.    Again, thank you for choosing [facility]  Our hope is that these requests will decrease the amount of time that you wait before being seen by our physicians.   If you have a lab appointment with the [facility] please come in thru the Main Entrance and check in at the main information desk.           _____________________________________________________________  Should you have questions after your visit to [facility], please contact our office at [phone] and follow the prompts.  Our office hours are 8:00 a.m. to 4:30 p.m. Monday - Thursday and 8:00 a.m. to 2:30 p.m. Friday.  Please note that voicemails left after 4:00 p.m. may not be returned until the following business  day.  We are closed weekends and all major holidays.  You do have access to a nurse 24-7, just call the main number to the clinic [phone] and do not press any options, hold on the line and a nurse will answer the phone.    For prescription refill requests, have your pharmacy contact our office and allow 72 hours.    Masks are optional in the cancer centers. If you would like for your care team to wear a mask while they are taking care of you, please let them know. You may have one support person who is at least 61 years old accompany you for your appointments.

## 2022-06-23 NOTE — Progress Notes (Signed)
[facility] NOTE  Patient Care Team: Valentino Nose, FNP as PCP - General (Family Medicine) Josue Hector, MD as PCP - Cardiology (Cardiology) Celene Squibb, MD as PCP - Internal Medicine (Internal Medicine) Fay Records, MD as Consulting Physician (Cardiology) Derek Jack, MD as Medical Oncologist (Medical Oncology) Brien Mates, RN as Oncology Nurse Navigator (Medical Oncology)  CHIEF COMPLAINTS/PURPOSE OF CONSULTATION:  Generalized lymphadenopathy on PET scan.  HISTORY OF PRESENTING ILLNESS:  Mallory Sutton 62 y.o. female is seen in consultation today at the request of Dr. Erin Fulling for generalized lymphadenopathy on recent PET scan.  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  She also has history of paroxysmal atrial fibrillation.  She is having recent worsening of shortness of breath.  She was started on prednisone 20 mg on 04/30/2022 without much improvement.  She had a CT scan of the chest on 06/03/2022 which did not show ILD.  However it showed numerous newly enlarged bilateral axillary, subpectoral, low cervical, mediastinal, hilar and upper abdominal lymph nodes.  Subsequently a PET scan was done on 06/18/2022 which showed generalized adenopathy with SUV between 2-4.  Spleen was top normal in size without increase in FDG uptake.  Low-level diffuse FDG activity within the marrow spaces throughout the spine and pelvis with no specific increased area.  Hepatic steatosis.  She does not report any fevers, night sweats or weight loss.  She has leg pains from vasculitis and takes gabapentin and pain medication.  She has knee effusions from time to time which gets drained.  She lives at home with her husband and son.  She is no longer working.  She is a non-smoker.  MEDICAL HISTORY:  Past Medical History:  Diagnosis Date   Aortic stenosis    ATRIAL ARRHYTHMIAS    Bicuspid aortic  valve    CHF (congestive heart failure) (HCC)    COARCTATION OF AORTA    ENDOMETRIOSIS    Heart murmur    Paroxysmal atrial fibrillation (HCC)    Shortness of breath    THORACIC AORTIC ANEURYSM    Vasculitis ([place])     SURGICAL HISTORY: Past Surgical History:  Procedure Laterality Date   CARDIOVERSION N/A 08/29/2019   Procedure: CARDIOVERSION;  Surgeon: Arnoldo Lenis, MD;  Location: [facility];  Service: Endoscopy;  Laterality: N/A;   coarctation repair and residual restenosis     KNEE SURGERY     TEE WITHOUT CARDIOVERSION N/A 07/12/2019   Procedure: TRANSESOPHAGEAL ECHOCARDIOGRAM (TEE);  Surgeon: Sueanne Margarita, MD;  Location: [facility] ENDOSCOPY;  Service: Cardiovascular;  Laterality: N/A;   TEE WITHOUT CARDIOVERSION N/A 08/29/2019   Procedure: TRANSESOPHAGEAL ECHOCARDIOGRAM (TEE) WITH PROPOFOL;  Surgeon: Arnoldo Lenis, MD;  Location: [facility];  Service: Endoscopy;  Laterality: N/A;    SOCIAL HISTORY: Social History   Socioeconomic History   Marital status: Married    Spouse name: Not on file   Number of children: Not on file   Years of education: Not on file   Highest education level: Not on file  Occupational History   Not on file  Tobacco Use   Smoking status: Never    Passive exposure: Past   Smokeless tobacco: Never  Vaping Use   Vaping Use: Never used  Substance and Sexual Activity   Alcohol use: Yes    Alcohol/week: 0.0 standard drinks of alcohol  Comment: 3 beers 3 nights a week   Drug use: No   Sexual activity: Never  Other Topics Concern   Not on file  Social History Narrative   Not on file   Social Determinants of Health   Financial Resource Strain: Not on file  Food Insecurity: Food Insecurity Present (06/23/2022)   Hunger Vital Sign    Worried About Running Out of Food in the Last Year: Sometimes true    Ran Out of Food in the Last Year: Sometimes true  Transportation Needs: No Transportation Needs (06/23/2022)   PRAPARE - Civil engineer, contracting (Medical): No    Lack of Transportation (Non-Medical): No  Physical Activity: Not on file  Stress: Not on file  Social Connections: Not on file  Intimate Partner Violence: Not At Risk (06/23/2022)   Humiliation, Afraid, Rape, and Kick questionnaire    Fear of Current or Ex-Partner: No    Emotionally Abused: No    Physically Abused: No    Sexually Abused: No    FAMILY HISTORY: Family History  Problem Relation Age of Onset   Suicidality Mother    Mental illness Mother    Drug abuse Brother    Healthy Daughter    Healthy Son     ALLERGIES:  is allergic to amiodarone and penicillins.  MEDICATIONS:  Current Outpatient Medications  Medication Sig Dispense Refill   albuterol (VENTOLIN HFA) 108 (90 Base) MCG/ACT inhaler Inhale 2 puffs into the lungs every 6 (six) hours as needed for wheezing or shortness of breath. 8 g 6   aspirin EC 81 MG tablet Take 81 mg by mouth daily.     Budeson-Glycopyrrol-Formoterol (BREZTRI AEROSPHERE) 160-9-4.8 MCG/ACT AERO Inhale into the lungs.     calcium-vitamin D (OSCAL WITH D) 500-5 MG-MCG tablet Take 1 tablet by mouth daily. 30 tablet 2   diazepam (VALIUM) 10 MG tablet Take 10 mg by mouth daily as needed for sleep.     feeding supplement (ENSURE ENLIVE / ENSURE PLUS) LIQD Take 237 mLs by mouth 2 (two) times daily between meals.     furosemide (LASIX) 40 MG tablet Take 80 mg by mouth daily as needed for fluid or edema.     gabapentin (NEURONTIN) 300 MG capsule Take 300-600 mg by mouth See admin instructions. Take 300 mg in the morning, 300 mg in the afternoon, and 600 mg at bedtime     HYDROcodone-acetaminophen (NORCO/VICODIN) 5-325 MG tablet Take 2 tablets by mouth 2 (two) times daily.     ipratropium-albuterol (DUONEB) 0.5-2.5 (3) MG/3ML SOLN SMARTSIG:3 Milliliter(s) Via Nebulizer Twice Daily PRN     magnesium oxide (MAGOX 400) 400 (240 Mg) MG tablet Take 1 tablet (400 mg total) by mouth daily. 30 tablet 2   metoprolol succinate  (TOPROL-XL) 25 MG 24 hr tablet TAKE 1 TABLET BY MOUTH ONCE DAILY. (Patient taking differently: Take 12.5 mg by mouth daily.) 90 tablet 3   Multiple Vitamin (MULTI-VITAMINS) TABS Take 1 tablet by mouth daily.      predniSONE (DELTASONE) 20 MG tablet Take 1 tablet (20 mg total) by mouth daily with breakfast. 90 tablet 3   propafenone (RYTHMOL SR) 225 MG 12 hr capsule Take 1 capsule (225 mg total) by mouth every 8 (eight) hours. 270 capsule 3   Sertraline HCl 150 MG CAPS Take 150 mg by mouth daily.     Turmeric 500 MG CAPS Take 500 mg by mouth 3 (three) times a week.     ondansetron (  ZOFRAN ODT) 4 MG disintegrating tablet Take 1 tablet (4 mg total) by mouth every 8 (eight) hours as needed for nausea or vomiting. 20 tablet 0   potassium chloride SA (KLOR-CON M) 20 MEQ tablet Take 1 tablet (20 mEq total) by mouth 2 (two) times daily for 7 days, THEN 1 tablet (20 mEq total) daily for 7 days. Take 3 tablets daily when taking lasix. 90 tablet 3   warfarin (COUMADIN) 1 MG tablet Take 3 tablets daily except 4 tablets on Fridays or as directed by coumadin clinic 100 tablet 5   No current facility-administered medications for this visit.    REVIEW OF SYSTEMS:   Constitutional: Denies fevers, chills or abnormal night sweats Eyes: Denies blurriness of vision, double vision or watery eyes Ears, nose, mouth, throat, and face: Denies mucositis or sore throat Respiratory: Positive for cough and shortness of breath. Cardiovascular: Chest tightness from A-fib and palpitations. Gastrointestinal:  Denies nausea, heartburn or change in bowel habits Skin: Denies abnormal skin rashes Lymphatics: Denies new lymphadenopathy or easy bruising Neurological: Numbness in the hands and feet. Behavioral/Psych: Mood is stable, no new changes  All other systems were reviewed with the patient and are negative.  PHYSICAL EXAMINATION: ECOG PERFORMANCE STATUS: 1 - Symptomatic but completely ambulatory  Vitals:   06/23/22 1335   BP: 119/78  Pulse: (!) 147  Resp: 20  Temp: 97.8 F (36.6 C)  SpO2: 100%   Filed Weights   06/23/22 1335  Weight: 105 lb 9.6 oz (47.9 kg)    GENERAL:alert, no distress and comfortable SKIN: skin color, texture, turgor are normal, no rashes or significant lesions EYES: normal, conjunctiva are pink and non-injected, sclera clear OROPHARYNX:no exudate, no erythema and lips, buccal mucosa, and tongue normal  NECK: supple, thyroid normal size, non-tender, without nodularity LYMPH: Small lymph nodes palpable in the bilateral axillary region and bilateral inguinal regions. LUNGS: clear to auscultation and percussion with normal breathing effort HEART: Mechanical heart valve sounds with systolic murmur present. ABDOMEN:abdomen soft, non-tender and normal bowel sounds Musculoskeletal:no cyanosis of digits and no clubbing  PSYCH: alert & oriented x 3 with fluent speech NEURO: no focal motor/sensory deficits  LABORATORY DATA:  I have reviewed the data as listed Lab Results  Component Value Date   WBC 21.8 (H) 06/23/2022   HGB 12.4 06/23/2022   HCT 37.9 06/23/2022   MCV 81.2 06/23/2022   PLT 446 (H) 06/23/2022     Chemistry      Component Value Date/Time   NA 137 06/23/2022 1417   NA 141 07/05/2019 1653   K 3.2 (L) 06/23/2022 1417   CL 98 06/23/2022 1417   CO2 26 06/23/2022 1417   BUN 19 06/23/2022 1417   BUN 13 07/05/2019 1653   CREATININE 1.04 (H) 06/23/2022 1417   CREATININE 0.78 03/18/2022 1322      Component Value Date/Time   CALCIUM 9.7 06/23/2022 1417   ALKPHOS 89 06/23/2022 1417   AST 38 06/23/2022 1417   ALT 28 06/23/2022 1417   BILITOT 0.6 06/23/2022 1417       RADIOGRAPHIC STUDIES: I have personally reviewed the radiological images as listed and agreed with the findings in the report. NM PET Image Initial (PI) Skull Base To Thigh  Result Date: 06/18/2022 CLINICAL DATA:  Initial treatment strategy for lymphadenopathy, concern for lymphoma. EXAM: NUCLEAR  MEDICINE PET SKULL BASE TO THIGH TECHNIQUE: 5.28 mCi F-18 FDG was injected intravenously. Full-ring PET imaging was performed from the skull base to thigh after  the radiotracer. CT data was obtained and used for attenuation correction and anatomic localization. Fasting blood glucose: 91 mg/dl COMPARISON:  January 06/02/2022 FINDINGS: Mediastinal blood pool activity: SUV max 1.31 Liver activity: SUV max 1.89 NECK: Adenopathy at the thoracic inlet, see below. Incidental CT findings: None. CHEST: Bilateral thoracic inlet adenopathy. SUV on the LEFT of lymph nodes measuring up to 12 mm 2.84. (Image 63/3) AP window lymph node at approximately 12 mm with a maximum SUV of 3.60. (Image 86/3) multiple lymph nodes in the retropectoral region bilaterally at or less than a cm. RIGHT paratracheal lymph nodes with similar appearance including a high RIGHT paratracheal lymph node on image 64/3 also displaying low level FDG uptake and a 9 mm short axis dimension. No focal parenchymal FDG uptake in the lungs. Incidental CT findings: Post aortic valve replacement and ascending thoracic aortic repair. Persistent LEFT SVC. Moderate cardiomegaly without effusion. Airways are patent. Lungs are clear without signs of consolidative change. ABDOMEN/PELVIS: Similar pattern of nodal enlargement in the abdomen and in the pelvis. Lymph nodes display similar low-to-moderate level uptake of FDG. Most pronounced adenopathy in the LEFT retroperitoneum largest 15 mm as a discrete lymph node on image 160/3 showing a maximum SUV of 3.89. Similar FDG uptake throughout all retroperitoneal and upper abdominal lymph nodes. Lymph nodes seen in the gastrohepatic ligament on image 145/3 measuring 11 mm. Intra-aortocaval nodal enlargement (image 171/3 with a levin mm lymph nodes. LEFT common iliac lymph node at 10 mm also with mild-to-moderate FDG uptake maximum SUV of 2.90. LEFT pelvic sidewall lymph node (image 220/3 10 mm short axis with similar FDG  avidity. Lymph nodes in the bilateral groin slightly greater than or just less than a cm size with similar FDG uptake. No focal area of solid organ activity. Spleen top normal size at 12 cm with splenic FDG uptake slightly less than liver. Incidental CT findings: Liver density up to 65 Hounsfield units. Mildly lobular hepatic contours. Gallbladder not visualized and potentially surgically absent. No acute findings grossly relative to pancreas, spleen, adrenal glands or kidneys. Adrenal glands is cured by numerous retroperitoneal lymph nodes. No perinephric stranding or signs of hydronephrosis. Urinary bladder is collapsed limiting assessment. No acute gastrointestinal process. Unremarkable appearance of reproductive structures. SKELETON: Low level diffuse FDG activity within marrow spaces throughout the spine and pelvis. No focal area of increased metabolic activity. 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 Compression fracture at the L2 level with chronic appearance similar to chest radiograph of October 08, 2014 in terms of mild kyphotic angulation of the spine at this level. IMPRESSION: 1. Generalized adenopathy perhaps slightly greatest in the abdomen in the retroperitoneum but seen throughout the low neck, chest, abdomen and pelvis. Findings are most suspicious for lymphoproliferative disorder showing mild-to-moderate FDG uptake. FDG uptake is greater than blood pool and liver. 2. Spleen top normal size with low level FDG uptake less than liver activity. 3. Low level diffuse FDG activity within marrow spaces throughout the spine and pelvis. No focal area of increased metabolic activity. In the absence of marrow stimulation findings are concerning for marrow space involvement. 4. Post aortic valve replacement and ascending thoracic aortic repair. 5. Hepatic steatosis. 6. Spinal degenerative changes with rotary dextroconvex curvature of the lumbar  spine 7. Increased density of the liver can be seen in the setting of amiodarone deposition. Correlate with any clinical or laboratory evidence of liver disease. Electronically Signed   By: Zetta Bills  M.D.   On: 06/18/2022 17:16   CT Chest High Resolution  Result Date: 06/03/2022 CLINICAL DATA:  Interstitial lung disease, chronic hypoxic respiratory failure * Tracking Code: BO * EXAM: CT CHEST WITHOUT CONTRAST TECHNIQUE: Multidetector CT imaging of the chest was performed following the standard protocol without intravenous contrast. High resolution imaging of the lungs, as well as inspiratory and expiratory imaging, was performed. RADIATION DOSE REDUCTION: This exam was performed according to the departmental dose-optimization program which includes automated exposure control, adjustment of the mA and/or kV according to patient size and/or use of iterative reconstruction technique. COMPARISON:  CT chest angiogram, 08/22/2014 FINDINGS: Cardiovascular: Aortic atherosclerosis. 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 Cardiomegaly. Enlargement of the main pulmonary artery measuring up to 3.5 cm in caliber. No pericardial effusion. 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 Mediastinum/Nodes: Numerous newly enlarged bilateral axillary, subpectoral, lower cervical, mediastinal, hilar, and most likely upper abdominal lymph nodes. Index pretracheal node measures 1.4 x 1.2 cm (series 2, image 51). Thyroid gland, trachea, and esophagus demonstrate no significant findings. Lungs/Pleura: No evidence of fibrotic interstitial lung disease. Background of very fine pulmonary nodules, most concentrated in the lung apices. Mild, lobular air trapping on expiratory phase imaging. Trace right pleural effusion. Upper Abdomen: No acute abnormality.  Partially imaged splenomegaly. Musculoskeletal: No chest wall  abnormality. No acute osseous findings. IMPRESSION: 1. No evidence of fibrotic interstitial lung disease. 2. Background of very fine pulmonary nodules, most concentrated in the lung apices. Findings are most commonly seen in smoking-related respiratory bronchiolitis although generally nonspecific and infectious or inflammatory. 3. Mild lobular air trapping on expiratory phase imaging, suggesting small airways disease. 4. Trace right pleural effusion. 5. Numerous newly enlarged bilateral axillary, subpectoral, lower cervical, mediastinal, hilar, and most likely upper abdominal lymph nodes. In conjunction with partially imaged splenomegaly, findings are concerning for lymphoma. 6. Cardiomegaly. Enlargement of the main pulmonary artery, as can be seen in pulmonary hypertension. These results will be called to the ordering clinician or representative by the Radiologist Assistant, and communication documented in the PACS or [employer]. Aortic Atherosclerosis (ICD10-I70.0). Electronically Signed   By: Delanna Ahmadi M.D.   On: 06/03/2022 11:37    ASSESSMENT:  1.  Generalized lymphadenopathy: - CT chest (06/03/2022): Newly enlarged bilateral axillary, subpectoral, low cervical, mediastinal, hilar and most likely upper abdominal lymph nodes.  No evidence of fibrotic interstitial lung disease. - PET scan (06/18/2022): Generalized lymphadenopathy more in the abdomen and retroperitoneum but seen throughout the low neck, chest, abdomen and pelvis, with SUV ranging between 2-4.  Spleen top normal size at 12 cm with FDG uptake slightly less than the liver.  Hepatic steatosis.  Low-level diffuse FDG activity within the marrow spaces throughout the spine and pelvis with no focal area of increased metabolic activity. - No B symptoms.  Rare night sweats. 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 - Status post AVR with On-X  valve, on Coumadin.  Also has paroxysmal AF.  2.  Social/family history: - Lives at home with her husband and son.  Seen with daughter Raquel Sarna today.  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  No chemical exposure.  Non-smoker.  Secondhand smoke exposure present. - Maternal grandmother died of leukemia.  Maternal grandfather had lung cancer.  Paternal grandfather  had lung cancer.  Paternal great aunt had cancer.  Maternal uncle had breast cancer x 2.   PLAN:  1.  Generalized lymphadenopathy: - We have reviewed PET scan images with the patient in detail. - Will recommend further workup with CBC, CMP, LDH, uric acid, beta-2 microglobulin. - Will check hepatitis B and C panel. - Recommend biopsy of the groin lymph nodes which are easily accessible. - RTC 1 week after biopsy to discuss results and further plan.   Orders Placed This Encounter  Procedures   CBC with Differential    Standing Status:   Future    Number of Occurrences:   1    Standing Expiration Date:   06/23/2023   Comprehensive metabolic panel    Standing Status:   Future    Number of Occurrences:   1    Standing Expiration Date:   06/23/2023   Lactate dehydrogenase    Standing Status:   Future    Number of Occurrences:   1    Standing Expiration Date:   06/23/2023   Beta 2 microglobulin, serum    Standing Status:   Future    Number of Occurrences:   1    Standing Expiration Date:   06/23/2023   Hepatitis B surface antigen    Standing Status:   Future    Number of Occurrences:   1    Standing Expiration Date:   06/23/2023   Hepatitis B surface antibody    Standing Status:   Future    Number of Occurrences:   1    Standing Expiration Date:   06/23/2023   Hepatitis B core antibody, total    Standing Status:   Future    Number of Occurrences:   1    Standing Expiration Date:   06/24/2023   Hepatitis C Antibody    Standing Status:   Future    Number of Occurrences:   1     Standing Expiration Date:   06/24/2023   Uric acid    Standing Status:   Future    Number of Occurrences:   1    Standing Expiration Date:   06/23/2023    Order Specific Question:   Release to patient    Answer:   Immediate [1]    Order Specific Question:   Remote health to draw?    Answer:   No   Ambulatory referral to Social Work    Referral Priority:   Routine    Referral Type:   Consultation    Referral Reason:   Specialty Services Required    Number of Visits Requested:   1    All questions were answered. The patient knows to call the clinic with any problems, questions or concerns.      Derek Jack, MD 06/23/2022 4:35 PM

## 2022-06-23 NOTE — ED Notes (Signed)
Pt ambulated to restroom with steady gait.

## 2022-06-23 NOTE — Patient Instructions (Signed)
Will make sure it is ok for you to hold your coumadin and verify with Dr. Delton Coombes where he wants me to sample.   Open Lymph Node Biopsy An open lymph node biopsy is a procedure to remove a lymph node so that it can be checked for disease. Lymph nodes are part of the body's disease-fighting system (immune system). The immune system protects the body from infections, germs, and diseases. An open lymph node biopsy may be done to: Look for germs or cancer cells in your lymph node. Find out why your lymph node is swollen. Find out more about a condition you have. Lymph nodes are found in many locations in the body. Biopsies are often done on lymph nodes in the head, neck, armpit, or groin. Tell a health care provider about: Any allergies you have. All medicines you are taking, including vitamins, herbs, eye drops, creams, and over-the-counter medicines. Any problems you or family members have had with anesthetic medicines. Any blood disorders you have. Any surgeries you have had. Any medical conditions you have or have had. Whether you are pregnant or may be pregnant. What are the risks? Generally, this is a safe procedure. However, problems may occur, including: Infection. Bleeding. Allergic reactions to medicines. Damage to surrounding structures or organs, such as a nerve. Scarring. What happens before the procedure? Medicines Ask your health care provider about: Changing or stopping your regular medicines. This is especially important if you are taking diabetes medicines or blood thinners. Taking medicines such as aspirin and ibuprofen. These medicines can thin your blood. Do not take these medicines before the procedure unless your health care provider tells you to take them. Taking over-the-counter medicines, vitamins, herbs, and supplements. Surgery safety Ask your health care provider: How your surgery site will be marked. What steps will be taken to help prevent infection.  These steps may include: Removing hair at the surgery site. Washing skin with a germ-killing soap. Receiving antibiotic medicine. General instructions Follow instructions from your health care provider about eating or drinking restrictions. You may have an exam or testing. You may have a blood or urine sample taken. If you will be going home right after the procedure, plan to have a responsible adult care for you for the time you are told. This is important. What happens during the procedure?  An IV will be inserted into one of your veins. You will be given one or more of the following: A medicine to help you relax (sedative). A medicine to numb the area (local anesthetic). An incision will be made in the area where your lymph node is located. Your lymph node will be removed. Your incision will be closed with stitches (sutures). An antibiotic ointment may be applied to your incision. A bandage (dressing) will be placed over your incision. The procedure may vary among health care providers and hospitals. What happens after the procedure? Your blood pressure, heart rate, breathing rate, and blood oxygen level will be monitored until you leave the hospital or clinic. Do not drive for 24 hours if you were given a sedative during your procedure. It is up to you to get the results of your procedure. Ask your health care provider, or the department that is doing the procedure, when your results will be ready. Summary An open lymph node biopsy is a procedure to remove a lymph node so that it can be checked for disease. Generally, this is a safe procedure. However, problems may occur, including bleeding, infection, allergic reaction  to medicines, and damage to other structures or organs. During the procedure, an incision will be made in the area of the lymph node, the lymph node will be removed, and the incision will be closed with sutures. You will be monitored after the procedure. Do not drive  for 24 hours if you were given a sedative during your procedure. This information is not intended to replace advice given to you by your health care provider. Make sure you discuss any questions you have with your health care provider. Document Revised: 02/01/2020 Document Reviewed: 02/01/2020 Elsevier Patient Education  [place].

## 2022-06-23 NOTE — Telephone Encounter (Signed)
-----   Message from Rigoberto Noel, MD sent at 06/23/2022  1:37 PM EST ----- Please make appointment in [place] office with available provider ----- Message ----- From: Freddi Starr, MD Sent: 06/09/2022   9:57 AM EST To: Josue Hector, MD; Chesley Mires, MD; #  [facility] with with per patient request. I think would be convenient for patient to be seen in [place] clinic if possible.  Jon  ----- Message ----- From: Rigoberto Noel, MD Sent: 06/08/2022   3:38 PM EST To: Josue Hector, MD; Chesley Mires, MD; #  We can see her in the [place] office if needed or she can switch to another provider at [facility].  In our group, the primary pulmonologist has to agree to provider switch. Hope that helps,Nish.  Davonna Belling ----- Message ----- From: Josue Hector, MD Sent: 06/08/2022   3:28 PM EST To: Chesley Mires, MD; Rigoberto Noel, MD; #  Hey guys-  can we get Lataisha to f/u with one of you instead of Roderic Palau No fault of his but she just doesn't want to see him and she has significant lung issues/lymphoma going on. She has been sick for a while and frustrated more with her own health than Jonathan's care I don't want her to not follow through with some care for her lungs   Thanks I know in our group its no issue to switch providers usually best for both parties involved   [facility]

## 2022-06-23 NOTE — ED Provider Notes (Signed)
[facility] Provider Note   CSN: [id number] Arrival date & time: 06/23/22  1729     History {Add pertinent medical, surgical, social history, OB history to HPI:1} Chief Complaint  Patient presents with   Atrial Fibrillation    Mallory Sutton is a 62 y.o. female.   Atrial Fibrillation Associated symptoms include chest pain and shortness of breath.  Patient presents for chest pain and shortness of breath.  Medical history includes atrial fibrillation, aortic valve replacement, COPD, anxiety, depression, CHF, GERD. Medications include warfarin, propafenone, metoprolol, Lasix.  She states that her rate and rhythm control medications have been decreased lately.  She has had increased frequency of palpitations and tachycardia.  This typically occurs at night.  Thursday night, she had onset of the symptoms and these have persisted over the past 5 days.  Patient states that she is chronically short of breath but does feel worsening shortness of breath with a rapid heart rate.  She is on 3 L of supplemental oxygen throughout the day and has been for the past year.  She does endorse some chest pain associated with her atrial fibrillation.  She was seen at cardiology office earlier today and was sent to the ED for further evaluation.  Patient states that she is adherent to her medications.     Home Medications Prior to Admission medications   Medication Sig Start Date End Date Taking? Authorizing Provider  albuterol (VENTOLIN HFA) 108 (90 Base) MCG/ACT inhaler Inhale 2 puffs into the lungs every 6 (six) hours as needed for wheezing or shortness of breath. 10/06/21   Freddi Starr, MD  aspirin EC 81 MG tablet Take 81 mg by mouth daily.    [provider]  Budeson-Glycopyrrol-Formoterol (BREZTRI AEROSPHERE) 160-9-4.8 MCG/ACT AERO Inhale into the lungs.    [provider]  calcium-vitamin D (OSCAL WITH D) 500-5 MG-MCG tablet Take 1  tablet by mouth daily. 11/10/21   Barton Dubois, MD  diazepam (VALIUM) 10 MG tablet Take 10 mg by mouth daily as needed for sleep. 07/04/21   [provider]  feeding supplement (ENSURE ENLIVE / ENSURE PLUS) LIQD Take 237 mLs by mouth 2 (two) times daily between meals. 11/10/21   Barton Dubois, MD  furosemide (LASIX) 40 MG tablet Take 80 mg by mouth daily as needed for fluid or edema.    [provider]  gabapentin (NEURONTIN) 300 MG capsule Take 300-600 mg by mouth See admin instructions. Take 300 mg in the morning, 300 mg in the afternoon, and 600 mg at bedtime    [provider]  HYDROcodone-acetaminophen (NORCO/VICODIN) 5-325 MG tablet Take 2 tablets by mouth 2 (two) times daily. 10/31/20   [provider]  ipratropium-albuterol (DUONEB) 0.5-2.5 (3) MG/3ML SOLN SMARTSIG:3 Milliliter(s) Via Nebulizer Twice Daily PRN 11/24/21   [provider]  magnesium oxide (MAGOX 400) 400 (240 Mg) MG tablet Take 1 tablet (400 mg total) by mouth daily. 02/06/22   Croitoru, Mihai, MD  metoprolol succinate (TOPROL-XL) 25 MG 24 hr tablet TAKE 1 TABLET BY MOUTH ONCE DAILY. Patient taking differently: Take 12.5 mg by mouth daily. 04/29/21   Josue Hector, MD  Multiple Vitamin (MULTI-VITAMINS) TABS Take 1 tablet by mouth daily.     [provider]  ondansetron (ZOFRAN ODT) 4 MG disintegrating tablet Take 1 tablet (4 mg total) by mouth every 8 (eight) hours as needed for nausea or vomiting. 01/14/21   Lamptey, Myrene Galas, MD  potassium  chloride SA (KLOR-CON M) 20 MEQ tablet Take 1 tablet (20 mEq total) by mouth 2 (two) times daily for 7 days, THEN 1 tablet (20 mEq total) daily for 7 days. Take 3 tablets daily when taking lasix. 04/30/22 06/23/88  Josue Hector, MD  predniSONE (DELTASONE) 20 MG tablet Take 1 tablet (20 mg total) by mouth daily with breakfast. 04/29/22   Josue Hector, MD  propafenone (RYTHMOL SR) 225 MG 12 hr capsule Take 1 capsule (225 mg total) by mouth  every 8 (eight) hours. 06/22/22   Josue Hector, MD  Sertraline HCl 150 MG CAPS Take 150 mg by mouth daily. 06/27/21   [provider]  Turmeric 500 MG CAPS Take 500 mg by mouth 3 (three) times a week.    [provider]  warfarin (COUMADIN) 1 MG tablet Take 3 tablets daily except 4 tablets on Fridays or as directed by coumadin clinic 06/23/22   Josue Hector, MD      Allergies    Amiodarone and Penicillins    Review of Systems   Review of Systems  Respiratory:  Positive for shortness of breath.   Cardiovascular:  Positive for chest pain and palpitations.  All other systems reviewed and are negative.   Physical Exam Updated Vital Signs BP (!) 145/97 (BP Location: Left Arm)   Pulse (!) 140   Temp 98.8 F (37.1 C) (Oral)   Resp (!) 22   LMP 07/16/2013   SpO2 100%  Physical Exam Vitals and nursing note reviewed.  Constitutional:      General: She is not in acute distress.    Appearance: Normal appearance. She is well-developed. She is not ill-appearing, toxic-appearing or diaphoretic.  HENT:     Head: Normocephalic and atraumatic.     Right Ear: External ear normal.     Left Ear: External ear normal.     Nose: Nose normal.     Mouth/Throat:     Mouth: Mucous membranes are moist.  Eyes:     Conjunctiva/sclera: Conjunctivae normal.  Cardiovascular:     Rate and Rhythm: Tachycardia present. Rhythm irregular.  Pulmonary:     Effort: Pulmonary effort is normal. No respiratory distress.  Abdominal:     General: There is no distension.     Palpations: Abdomen is soft.     Tenderness: There is no abdominal tenderness.  Musculoskeletal:        General: No swelling. Normal range of motion.     Cervical back: Normal range of motion and neck supple.     Right lower leg: No edema.     Left lower leg: No edema.  Skin:    General: Skin is warm and dry.     Coloration: Skin is not jaundiced or pale.  Neurological:     General: No focal deficit present.      Mental Status: She is alert and oriented to person, place, and time.  Psychiatric:        Mood and Affect: Mood normal.        Behavior: Behavior normal.     ED Results / Procedures / Treatments   Labs (all labs ordered are listed, but only abnormal results are displayed) Labs Reviewed  BASIC METABOLIC PANEL  CBC  PROTIME-INR  POC URINE PREG, ED    EKG None  Radiology No results found.  Procedures .Cardioversion  Date/Time: 06/23/2022 10:48 PM  Performed by: Godfrey Pick, MD Authorized by: Godfrey Pick, MD   Consent:  Consent obtained:  Verbal   Consent given by:  Patient   Risks discussed:  Induced arrhythmia and pain   Alternatives discussed:  No treatment, rate-control medication and delayed treatment Pre-procedure details:    Cardioversion basis:  Elective   Rhythm:  Atrial fibrillation   Electrode placement:  Anterior-posterior Patient sedated: Yes. Refer to sedation procedure documentation for details of sedation.  Attempt one:    Cardioversion mode:  Synchronous   Shock (Joules):  120   Shock outcome:  Conversion to normal sinus rhythm Post-procedure details:    Patient status:  Awake   Patient tolerance of procedure:  Tolerated well, no immediate complications .Sedation  Date/Time: 06/23/2022 10:48 PM  Performed by: Godfrey Pick, MD Authorized by: Godfrey Pick, MD   Consent:    Consent obtained:  Verbal   Consent given by:  Patient   Risks discussed:  Nausea, vomiting, respiratory compromise necessitating ventilatory assistance and intubation and inadequate sedation Universal protocol:    Immediately prior to procedure, a time out was called: yes     Patient identity confirmed:  Verbally with patient Indications:    Procedure performed:  Cardioversion Pre-sedation assessment:    Time since last food or drink:  12 hours   ASA classification: class 3 - patient with severe systemic disease     Mouth opening:  2 finger widths   Thyromental distance:   3 finger widths   Mallampati score:  III - soft palate, base of uvula visible   Neck mobility: normal     Pre-sedation assessments completed and reviewed: airway patency, cardiovascular function, hydration status, mental status, nausea/vomiting, pain level and respiratory function     Pre-sedation assessment completed:  06/23/2022 10:09 PM Immediate pre-procedure details:    Reassessment: Patient reassessed immediately prior to procedure     Reviewed: vital signs and NPO status     Verified: bag valve mask available, emergency equipment available, intubation equipment available, IV patency confirmed and oxygen available   Procedure details (see MAR for exact dosages):    Preoxygenation:  Room air   Sedation:  Etomidate   Intended level of sedation: moderate (conscious sedation)   Analgesia:  Fentanyl   Intra-procedure monitoring:  Blood pressure monitoring, cardiac monitor, continuous capnometry, continuous pulse oximetry, frequent LOC assessments and frequent vital sign checks   Intra-procedure events: none     Total Provider sedation time (minutes):  11 Post-procedure details:    Post-sedation assessment completed:  06/23/2022 10:49 PM   Attendance: Constant attendance by certified staff until patient recovered     Recovery: Patient returned to pre-procedure baseline     Post-sedation assessments completed and reviewed: airway patency, cardiovascular function, hydration status, mental status, nausea/vomiting, pain level and respiratory function     Patient is stable for discharge or admission: yes     Procedure completion:  Tolerated well, no immediate complications   {Document cardiac monitor, telemetry assessment procedure when appropriate:1}  Medications Ordered in ED Medications - No data to display  ED Course/ Medical Decision Making/ A&P   {   Click here for ABCD2, HEART and other calculatorsREFRESH Note before signing :1}                          Medical Decision  Making Amount and/or Complexity of Data Reviewed Labs: ordered.  Risk Prescription drug management.   This patient presents to the ED for concern of ***, this involves an extensive number of treatment options, and  is a complaint that carries with it a high risk of complications and morbidity.  The differential diagnosis includes ***   Co morbidities that complicate the patient evaluation  ***   Additional history obtained:  Additional history obtained from *** External records from outside source obtained and reviewed including ***   Lab Tests:  I Ordered, and personally interpreted labs.  The pertinent results include:  ***   Imaging Studies ordered:  I ordered imaging studies including ***  I independently visualized and interpreted imaging which showed *** I agree with the radiologist interpretation   Cardiac Monitoring: / EKG:  The patient was maintained on a cardiac monitor.  I personally viewed and interpreted the cardiac monitored which showed an underlying rhythm of: ***   Consultations Obtained:  I requested consultation with the ***,  and discussed lab and imaging findings as well as pertinent plan - they recommend: ***   Problem List / ED Course / Critical interventions / Medication management  *** I ordered medication including ***  for ***  Reevaluation of the patient after these medicines showed that the patient {resolved/improved/worsened:23923::"improved"} I have reviewed the patients home medicines and have made adjustments as needed   Social Determinants of Health:  ***   Test / Admission - Considered:  ***   {Document critical care time when appropriate:1} {Document review of labs and clinical decision tools ie heart score, Chads2Vasc2 etc:1}  {Document your independent review of radiology images, and any outside records:1} {Document your discussion with family members, caretakers, and with consultants:1} {Document social  determinants of health affecting pt's care:1} {Document your decision making why or why not admission, treatments were needed:1} Final Clinical Impression(s) / ED Diagnoses Final diagnoses:  None    Rx / DC Orders ED Discharge Orders     None

## 2022-06-23 NOTE — Telephone Encounter (Signed)
ATC patient. Left her a vm letting her know we are wanting to schedule her an ov with a  office provider and to call back to schedule.

## 2022-06-24 ENCOUNTER — Other Ambulatory Visit: Payer: Self-pay

## 2022-06-24 ENCOUNTER — Telehealth: Payer: Self-pay | Admitting: Cardiovascular Disease

## 2022-06-24 ENCOUNTER — Encounter (HOSPITAL_COMMUNITY): Payer: Self-pay | Admitting: Family Medicine

## 2022-06-24 DIAGNOSIS — N809 Endometriosis, unspecified: Secondary | ICD-10-CM | POA: Diagnosis present

## 2022-06-24 DIAGNOSIS — N179 Acute kidney failure, unspecified: Secondary | ICD-10-CM | POA: Diagnosis present

## 2022-06-24 DIAGNOSIS — F419 Anxiety disorder, unspecified: Secondary | ICD-10-CM | POA: Diagnosis present

## 2022-06-24 DIAGNOSIS — J449 Chronic obstructive pulmonary disease, unspecified: Secondary | ICD-10-CM | POA: Diagnosis present

## 2022-06-24 DIAGNOSIS — Z952 Presence of prosthetic heart valve: Secondary | ICD-10-CM | POA: Diagnosis not present

## 2022-06-24 DIAGNOSIS — I4892 Unspecified atrial flutter: Secondary | ICD-10-CM | POA: Diagnosis present

## 2022-06-24 DIAGNOSIS — I776 Arteritis, unspecified: Secondary | ICD-10-CM | POA: Diagnosis present

## 2022-06-24 DIAGNOSIS — D72829 Elevated white blood cell count, unspecified: Secondary | ICD-10-CM | POA: Diagnosis present

## 2022-06-24 DIAGNOSIS — Z1152 Encounter for screening for COVID-19: Secondary | ICD-10-CM | POA: Diagnosis not present

## 2022-06-24 DIAGNOSIS — Q251 Coarctation of aorta: Secondary | ICD-10-CM | POA: Diagnosis not present

## 2022-06-24 DIAGNOSIS — I7121 Aneurysm of the ascending aorta, without rupture: Secondary | ICD-10-CM | POA: Diagnosis present

## 2022-06-24 DIAGNOSIS — R599 Enlarged lymph nodes, unspecified: Secondary | ICD-10-CM | POA: Diagnosis not present

## 2022-06-24 DIAGNOSIS — E876 Hypokalemia: Secondary | ICD-10-CM | POA: Diagnosis present

## 2022-06-24 DIAGNOSIS — Q231 Congenital insufficiency of aortic valve: Secondary | ICD-10-CM | POA: Diagnosis not present

## 2022-06-24 DIAGNOSIS — I110 Hypertensive heart disease with heart failure: Secondary | ICD-10-CM | POA: Diagnosis present

## 2022-06-24 DIAGNOSIS — K219 Gastro-esophageal reflux disease without esophagitis: Secondary | ICD-10-CM | POA: Diagnosis present

## 2022-06-24 DIAGNOSIS — Z7901 Long term (current) use of anticoagulants: Secondary | ICD-10-CM | POA: Diagnosis not present

## 2022-06-24 DIAGNOSIS — I5032 Chronic diastolic (congestive) heart failure: Secondary | ICD-10-CM | POA: Diagnosis present

## 2022-06-24 DIAGNOSIS — I350 Nonrheumatic aortic (valve) stenosis: Secondary | ICD-10-CM | POA: Diagnosis present

## 2022-06-24 DIAGNOSIS — I4891 Unspecified atrial fibrillation: Secondary | ICD-10-CM | POA: Diagnosis not present

## 2022-06-24 DIAGNOSIS — D75839 Thrombocytosis, unspecified: Secondary | ICD-10-CM | POA: Diagnosis present

## 2022-06-24 DIAGNOSIS — C8595 Non-Hodgkin lymphoma, unspecified, lymph nodes of inguinal region and lower limb: Secondary | ICD-10-CM | POA: Diagnosis present

## 2022-06-24 DIAGNOSIS — Z7952 Long term (current) use of systemic steroids: Secondary | ICD-10-CM | POA: Diagnosis not present

## 2022-06-24 DIAGNOSIS — J9611 Chronic respiratory failure with hypoxia: Secondary | ICD-10-CM | POA: Diagnosis present

## 2022-06-24 DIAGNOSIS — Z01810 Encounter for preprocedural cardiovascular examination: Secondary | ICD-10-CM

## 2022-06-24 DIAGNOSIS — R590 Localized enlarged lymph nodes: Secondary | ICD-10-CM | POA: Diagnosis not present

## 2022-06-24 DIAGNOSIS — I4819 Other persistent atrial fibrillation: Secondary | ICD-10-CM | POA: Diagnosis present

## 2022-06-24 DIAGNOSIS — D539 Nutritional anemia, unspecified: Secondary | ICD-10-CM | POA: Diagnosis present

## 2022-06-24 DIAGNOSIS — F32A Depression, unspecified: Secondary | ICD-10-CM | POA: Diagnosis present

## 2022-06-24 LAB — MRSA NEXT GEN BY PCR, NASAL: MRSA by PCR Next Gen: NOT DETECTED

## 2022-06-24 LAB — PROTIME-INR
INR: 1.8 — ABNORMAL HIGH (ref 0.8–1.2)
INR: 1.8 — ABNORMAL HIGH (ref 0.8–1.2)
Prothrombin Time: 20.7 seconds — ABNORMAL HIGH (ref 11.4–15.2)
Prothrombin Time: 20.3 seconds — ABNORMAL HIGH (ref 11.4–15.2)

## 2022-06-24 LAB — CBC
HCT: 31.1 % — ABNORMAL LOW (ref 36.0–46.0)
Hemoglobin: 9.6 g/dL — ABNORMAL LOW (ref 12.0–15.0)
MCH: 26.4 pg (ref 26.0–34.0)
MCHC: 30.9 g/dL (ref 30.0–36.0)
MCV: 85.7 fL (ref 80.0–100.0)
Platelets: 312 10*3/uL (ref 150–400)
RBC: 3.63 MIL/uL — ABNORMAL LOW (ref 3.87–5.11)
RDW: 15.9 % — ABNORMAL HIGH (ref 11.5–15.5)
WBC: 19.8 10*3/uL — ABNORMAL HIGH (ref 4.0–10.5)
nRBC: 0 % (ref 0.0–0.2)

## 2022-06-24 LAB — MAGNESIUM: Magnesium: 2.2 mg/dL (ref 1.7–2.4)

## 2022-06-24 LAB — BETA 2 MICROGLOBULIN, SERUM: Beta-2 Microglobulin: 2.7 mg/L — ABNORMAL HIGH (ref 0.6–2.4)

## 2022-06-24 LAB — BASIC METABOLIC PANEL
Anion gap: 7 (ref 5–15)
BUN: 19 mg/dL (ref 6–20)
CO2: 26 mmol/L (ref 22–32)
Calcium: 7.9 mg/dL — ABNORMAL LOW (ref 8.9–10.3)
Chloride: 106 mmol/L (ref 98–111)
Creatinine, Ser: 0.82 mg/dL (ref 0.44–1.00)
GFR, Estimated: >60 mL/min (ref >60)
Glucose, Bld: 103 mg/dL — ABNORMAL HIGH (ref 70–99)
Potassium: 4.1 mmol/L (ref 3.5–5.1)
Sodium: 139 mmol/L (ref 135–145)

## 2022-06-24 LAB — HEPARIN LEVEL (UNFRACTIONATED): Heparin Unfractionated: <0.1 IU/mL — ABNORMAL LOW (ref 0.30–0.70)

## 2022-06-24 MED ORDER — WARFARIN SODIUM 2 MG PO TABS: 4.0000 mg | ORAL_TABLET | ORAL | Status: DC

## 2022-06-24 MED ORDER — LACTATED RINGERS IV SOLN: INTRAVENOUS | Status: AC

## 2022-06-24 MED ORDER — PROPAFENONE HCL ER 225 MG PO CP12
225.0000 mg | ORAL_CAPSULE | Freq: Three times a day (TID) | ORAL | Status: DC
  Administered 2022-06-24 – 2022-06-27 (×10): 225 mg via ORAL
  Filled 2022-06-24 (×19): qty 1

## 2022-06-24 MED ORDER — PANTOPRAZOLE SODIUM 40 MG PO TBEC
40.0000 mg | DELAYED_RELEASE_TABLET | Freq: Every day | ORAL | Status: DC
  Administered 2022-06-24 – 2022-06-27 (×4): 40 mg via ORAL
  Filled 2022-06-24 (×4): qty 1

## 2022-06-24 MED ORDER — NITROGLYCERIN 0.4 MG SL SUBL
0.4000 mg | SUBLINGUAL_TABLET | SUBLINGUAL | Status: DC | PRN
  Administered 2022-06-25: 0.4 mg via SUBLINGUAL
  Filled 2022-06-24: qty 1

## 2022-06-24 MED ORDER — WARFARIN - PHARMACIST DOSING INPATIENT: Freq: Every day | Status: DC

## 2022-06-24 MED ORDER — CHLORHEXIDINE GLUCONATE CLOTH 2 % EX PADS
6.0000 | MEDICATED_PAD | Freq: Every day | CUTANEOUS | Status: DC
  Administered 2022-06-24: 6 via TOPICAL

## 2022-06-24 MED ORDER — NITROGLYCERIN 0.4 MG SL SUBL
SUBLINGUAL_TABLET | SUBLINGUAL | Status: AC
  Administered 2022-06-24: 0.4 mg
  Filled 2022-06-24: qty 1

## 2022-06-24 MED ORDER — POLYETHYLENE GLYCOL 3350 17 G PO PACK: 17.0000 g | PACK | Freq: Every day | ORAL | Status: DC | PRN

## 2022-06-24 MED ORDER — VANCOMYCIN HCL IN DEXTROSE 1-5 GM/200ML-% IV SOLN
1000.0000 mg | INTRAVENOUS | Status: AC
  Administered 2022-06-25 (×2): 1000 mg via INTRAVENOUS
  Filled 2022-06-24: qty 200

## 2022-06-24 MED ORDER — CHLORHEXIDINE GLUCONATE CLOTH 2 % EX PADS
6.0000 | MEDICATED_PAD | Freq: Once | CUTANEOUS | Status: AC
  Administered 2022-06-24: 6 via TOPICAL

## 2022-06-24 MED ORDER — DIAZEPAM 5 MG PO TABS
5.0000 mg | ORAL_TABLET | Freq: Two times a day (BID) | ORAL | Status: DC | PRN
  Administered 2022-06-24 – 2022-06-26 (×2): 5 mg via ORAL
  Filled 2022-06-24 (×3): qty 1

## 2022-06-24 MED ORDER — WARFARIN SODIUM 2 MG PO TABS
3.0000 mg | ORAL_TABLET | ORAL | Status: DC
  Administered 2022-06-24: 3 mg via ORAL
  Filled 2022-06-24: qty 1

## 2022-06-24 MED ORDER — PREDNISONE 20 MG PO TABS
20.0000 mg | ORAL_TABLET | Freq: Every day | ORAL | Status: DC
  Administered 2022-06-24 – 2022-06-27 (×3): 20 mg via ORAL
  Filled 2022-06-24 (×3): qty 1

## 2022-06-24 MED ORDER — DIAZEPAM 5 MG PO TABS: 10.0000 mg | ORAL_TABLET | Freq: Every day | ORAL | Status: DC | PRN

## 2022-06-24 MED ORDER — SODIUM CHLORIDE 0.9% FLUSH
3.0000 mL | Freq: Two times a day (BID) | INTRAVENOUS | Status: DC
  Administered 2022-06-24 – 2022-06-27 (×7): 3 mL via INTRAVENOUS

## 2022-06-24 MED ORDER — LORAZEPAM 2 MG/ML IJ SOLN
1.0000 mg | Freq: Once | INTRAMUSCULAR | Status: AC
  Administered 2022-06-24: 1 mg via INTRAVENOUS
  Filled 2022-06-24: qty 1

## 2022-06-24 MED ORDER — UMECLIDINIUM BROMIDE 62.5 MCG/ACT IN AEPB
1.0000 | INHALATION_SPRAY | Freq: Every day | RESPIRATORY_TRACT | Status: DC
  Administered 2022-06-24 – 2022-06-27 (×3): 1 via RESPIRATORY_TRACT
  Filled 2022-06-24: qty 7

## 2022-06-24 MED ORDER — ACETAMINOPHEN 325 MG PO TABS: 650.0000 mg | ORAL_TABLET | Freq: Four times a day (QID) | ORAL | Status: DC | PRN

## 2022-06-24 MED ORDER — MOMETASONE FURO-FORMOTEROL FUM 100-5 MCG/ACT IN AERO
2.0000 | INHALATION_SPRAY | Freq: Two times a day (BID) | RESPIRATORY_TRACT | Status: DC
  Administered 2022-06-24 – 2022-06-27 (×5): 2 via RESPIRATORY_TRACT
  Filled 2022-06-24 (×2): qty 8.8

## 2022-06-24 MED ORDER — ASPIRIN 81 MG PO TBEC
81.0000 mg | DELAYED_RELEASE_TABLET | Freq: Every day | ORAL | Status: DC
  Administered 2022-06-24 – 2022-06-27 (×4): 81 mg via ORAL
  Filled 2022-06-24 (×4): qty 1

## 2022-06-24 MED ORDER — WARFARIN SODIUM 2 MG PO TABS: 4.0000 mg | ORAL_TABLET | Freq: Once | ORAL | Status: DC

## 2022-06-24 MED ORDER — HEPARIN (PORCINE) 25000 UT/250ML-% IV SOLN
700.0000 [IU]/h | INTRAVENOUS | Status: AC
  Administered 2022-06-24: 700 [IU]/h via INTRAVENOUS
  Filled 2022-06-24: qty 250

## 2022-06-24 MED ORDER — ACETAMINOPHEN 650 MG RE SUPP: 650.0000 mg | Freq: Four times a day (QID) | RECTAL | Status: DC | PRN

## 2022-06-24 MED ORDER — BUDESON-GLYCOPYRROL-FORMOTEROL 160-9-4.8 MCG/ACT IN AERO: 2.0000 | INHALATION_SPRAY | Freq: Two times a day (BID) | RESPIRATORY_TRACT | Status: DC

## 2022-06-24 MED ORDER — GABAPENTIN 300 MG PO CAPS
600.0000 mg | ORAL_CAPSULE | Freq: Every day | ORAL | Status: DC
  Administered 2022-06-24 – 2022-06-26 (×4): 600 mg via ORAL
  Filled 2022-06-24 (×4): qty 2

## 2022-06-24 MED ORDER — METOPROLOL SUCCINATE ER 25 MG PO TB24
12.5000 mg | ORAL_TABLET | Freq: Every day | ORAL | Status: DC
  Administered 2022-06-24 – 2022-06-25 (×2): 12.5 mg via ORAL
  Filled 2022-06-24 (×3): qty 1

## 2022-06-24 MED ORDER — GABAPENTIN 300 MG PO CAPS
300.0000 mg | ORAL_CAPSULE | Freq: Two times a day (BID) | ORAL | Status: DC
  Administered 2022-06-24 – 2022-06-27 (×6): 300 mg via ORAL
  Filled 2022-06-24 (×6): qty 1

## 2022-06-24 MED ORDER — FENTANYL CITRATE PF 50 MCG/ML IJ SOSY
25.0000 ug | PREFILLED_SYRINGE | INTRAMUSCULAR | Status: DC | PRN
  Administered 2022-06-24 – 2022-06-25 (×2): 25 ug via INTRAVENOUS
  Administered 2022-06-25 – 2022-06-27 (×7): 50 ug via INTRAVENOUS
  Filled 2022-06-24 (×10): qty 1

## 2022-06-24 MED ORDER — SERTRALINE HCL 50 MG PO TABS
150.0000 mg | ORAL_TABLET | Freq: Every day | ORAL | Status: DC
  Administered 2022-06-24 – 2022-06-27 (×4): 150 mg via ORAL
  Filled 2022-06-24 (×4): qty 3

## 2022-06-24 MED ORDER — HYDROCODONE-ACETAMINOPHEN 5-325 MG PO TABS
1.0000 | ORAL_TABLET | ORAL | Status: DC | PRN
  Administered 2022-06-24 – 2022-06-26 (×4): 1 via ORAL
  Filled 2022-06-24 (×5): qty 1

## 2022-06-24 MED ORDER — ALBUTEROL SULFATE (2.5 MG/3ML) 0.083% IN NEBU
3.0000 mL | INHALATION_SOLUTION | Freq: Four times a day (QID) | RESPIRATORY_TRACT | Status: DC | PRN
  Administered 2022-06-26 (×2): 3 mL via RESPIRATORY_TRACT
  Filled 2022-06-24 (×2): qty 3

## 2022-06-24 NOTE — Telephone Encounter (Signed)
[place] pt

## 2022-06-24 NOTE — Progress Notes (Signed)
ANTICOAGULATION CONSULT NOTE -   Pharmacy Consult for warfarin >> heparin Indication: atrial fibrillation/aortic valve replacement.  Allergies  Allergen Reactions   Amiodarone Nausea Only   Penicillins Hives    Has patient had a PCN reaction causing immediate rash, facial/tongue/throat swelling, SOB or lightheadedness with hypotension: YES Has patient had a PCN reaction causing severe rash involving mucus membranes or skin necrosis: NO Has patient had a PCN reaction that required hospitalization NO Has patient had a PCN reaction occurring within the last 10 years: NO If all of the above answers are "NO", then may proceed with Cephalosporin use.    Vital Signs: Temp: 98.1 F (36.7 C) (02/21 1105) Temp Source: Oral (02/21 1105) BP: 129/64 (02/21 1300) Pulse Rate: 86 (02/21 1300)  Labs: Recent Labs    06/23/22 1417 06/23/22 1507 06/23/22 1900 06/23/22 2030 06/24/22 0532  HGB 12.4  --  12.1  --  9.6*  HCT 37.9  --  38.5  --  31.1*  PLT 446*  --  466*  --  312  LABPROT  --   --  22.6*  --  20.7*  INR  --  2.9 2.0*  --  1.8*  CREATININE 1.04*  --  1.03*  --  0.82  TROPONINIHS  --   --  14 11  --      Estimated Creatinine Clearance: 55.6 mL/min (by C-G formula based on SCr of 0.82 mg/dL).   Medical History: Past Medical History:  Diagnosis Date   Aortic stenosis    ATRIAL ARRHYTHMIAS    Bicuspid aortic valve    CHF (congestive heart failure) (HCC)    COARCTATION OF AORTA    ENDOMETRIOSIS    Heart murmur    Paroxysmal atrial fibrillation (HCC)    Shortness of breath    THORACIC AORTIC ANEURYSM    Vasculitis (HCC)     Assessment: 62yo female was seen at cards office today >> rec'd to go to ED for further Afib monitoring prior to adjusting anti-arrhythmic meds, to continue Coumadin; current INR 1.8. home dose listed as 4 mg on Fridays and 3 mg ROW.  INR 2.9 > 2.0 > 1.8  Transitioning to heparin for biopsy hopefully 2/22.  Goal of Therapy:  INR 2-3   Plan:   Start heparin infusion at 700 units/hr. Heparin level in 6 hours and daily Hold heparin starting at midnight per surgery   Margot Ables, PharmD Clinical Pharmacist 06/24/2022 1:40 PM

## 2022-06-24 NOTE — Progress Notes (Signed)
ANTICOAGULATION CONSULT NOTE -   Pharmacy Consult for warfarin Indication: atrial fibrillation/aortic valve replacement.  Allergies  Allergen Reactions   Amiodarone Nausea Only   Penicillins Hives    Has patient had a PCN reaction causing immediate rash, facial/tongue/throat swelling, SOB or lightheadedness with hypotension: YES Has patient had a PCN reaction causing severe rash involving mucus membranes or skin necrosis: NO Has patient had a PCN reaction that required hospitalization NO Has patient had a PCN reaction occurring within the last 10 years: NO If all of the above answers are "NO", then may proceed with Cephalosporin use.    Vital Signs: Temp: 98.1 F (36.7 C) (02/21 1105) Temp Source: Oral (02/21 1105) BP: 146/52 (02/21 1042) Pulse Rate: 88 (02/21 1042)  Labs: Recent Labs    06/23/22 1417 06/23/22 1507 06/23/22 1900 06/23/22 2030 06/24/22 0532  HGB 12.4  --  12.1  --  9.6*  HCT 37.9  --  38.5  --  31.1*  PLT 446*  --  466*  --  312  LABPROT  --   --  22.6*  --  20.7*  INR  --  2.9 2.0*  --  1.8*  CREATININE 1.04*  --  1.03*  --  0.82  TROPONINIHS  --   --  14 11  --      Estimated Creatinine Clearance: 55.6 mL/min (by C-G formula based on SCr of 0.82 mg/dL).   Medical History: Past Medical History:  Diagnosis Date   Aortic stenosis    ATRIAL ARRHYTHMIAS    Bicuspid aortic valve    CHF (congestive heart failure) (HCC)    COARCTATION OF AORTA    ENDOMETRIOSIS    Heart murmur    Paroxysmal atrial fibrillation (HCC)    Shortness of breath    THORACIC AORTIC ANEURYSM    Vasculitis (HCC)     Assessment: 62yo female was seen at cards office today >> rec'd to go to ED for further Afib monitoring prior to adjusting anti-arrhythmic meds, to continue Coumadin; current INR 1.8. home dose listed as 4 mg on Fridays and 3 mg ROW.  INR 2.9 > 2.0 > 1.8  Goal of Therapy:  INR 2-3   Plan:  Warfarin 4 mg x 1 dose. Monitor INR.  Mallory Sutton,  PharmD Clinical Pharmacist 06/24/2022 11:07 AM

## 2022-06-24 NOTE — H&P (Signed)
History and Physical    Mallory Sutton [id number] DOB: August 23, 1960 DOA: 06/23/2022  PCP: Valentino Nose, FNP   Patient coming from: Home   Chief Complaint: SOB, palpitations   HPI: Mallory Sutton is a 62 y.o. 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  Patient reports suffering from rapid palpitations and dyspnea since 06/18/2022.  She was seen in the cardiology clinic for this today, noted to be in rapid atrial flutter, and was directed to the ED.  ED Course: Upon arrival to the ED, patient is found to be afebrile and saturating well on 3 L/min of supplemental oxygen with elevated heart rate and low-normal blood pressure.  Labs are notable for WBC 19,500, normal troponin x 2, INR 2.0, potassium 2.8, and creatinine 1.03.  Patient underwent successful DCCV in the ED and was treated with oral and IV potassium, Zofran, fentanyl, and IV fluids.  Review of Systems:  All other systems reviewed and apart from HPI, are negative.  Past Medical History:  Diagnosis Date   Aortic stenosis    ATRIAL ARRHYTHMIAS    Bicuspid aortic valve    CHF (congestive heart failure) (HCC)    COARCTATION OF AORTA    ENDOMETRIOSIS    Heart murmur    Paroxysmal atrial fibrillation (HCC)    Shortness of breath    THORACIC AORTIC ANEURYSM    Vasculitis ([place])     Past Surgical History:  Procedure Laterality Date   CARDIOVERSION N/A 08/29/2019   Procedure: CARDIOVERSION;  Surgeon: Arnoldo Lenis, MD;  Location: [facility];  Service: Endoscopy;  Laterality: N/A;   coarctation repair and residual restenosis     KNEE SURGERY     TEE WITHOUT CARDIOVERSION N/A 07/12/2019   Procedure:  TRANSESOPHAGEAL ECHOCARDIOGRAM (TEE);  Surgeon: Sueanne Margarita, MD;  Location: [facility] ENDOSCOPY;  Service: Cardiovascular;  Laterality: N/A;   TEE WITHOUT CARDIOVERSION N/A 08/29/2019   Procedure: TRANSESOPHAGEAL ECHOCARDIOGRAM (TEE) WITH PROPOFOL;  Surgeon: Arnoldo Lenis, MD;  Location: [facility];  Service: Endoscopy;  Laterality: N/A;    Social History:   reports that she has never smoked. She has been exposed to tobacco smoke. She has never used smokeless tobacco. She reports current alcohol use. She reports that she does not use drugs.  Allergies  Allergen Reactions   Amiodarone Nausea Only   Penicillins Hives    Has patient had a PCN reaction causing immediate rash, facial/tongue/throat swelling, SOB or lightheadedness with hypotension: YES Has patient had a PCN reaction causing severe rash involving mucus membranes or skin necrosis: NO Has patient had a PCN reaction that required hospitalization NO Has patient had a PCN reaction occurring within the last 10 years: NO If all of the above answers are "NO", then may proceed with Cephalosporin use.     Family History  Problem Relation Age of Onset   Suicidality Mother    Mental illness Mother    Drug abuse Brother    Healthy Daughter    Healthy Son      Prior to Admission medications   Medication Sig Start Date End Date Taking? Authorizing Provider  albuterol (VENTOLIN HFA) 108 (90 Base) MCG/ACT inhaler Inhale 2 puffs into the lungs every 6 (six) hours as needed  for wheezing or shortness of breath. 10/06/21  Yes Freddi Starr, MD  aspirin EC 81 MG tablet Take 81 mg by mouth daily.   Yes [provider]  Budeson-Glycopyrrol-Formoterol (BREZTRI AEROSPHERE) 160-9-4.8 MCG/ACT AERO Inhale 2 puffs into the lungs 2 (two) times daily.   Yes [provider]  calcium-vitamin D (OSCAL WITH D) 500-5 MG-MCG tablet Take 1 tablet by mouth daily. 11/10/21  Yes Barton Dubois, MD  diazepam (VALIUM) 10 MG tablet Take 10 mg by  mouth daily as needed for sleep. 07/04/21  Yes [provider]  feeding supplement (ENSURE ENLIVE / ENSURE PLUS) LIQD Take 237 mLs by mouth 2 (two) times daily between meals. 11/10/21  Yes Barton Dubois, MD  furosemide (LASIX) 40 MG tablet Take 80 mg by mouth daily.   Yes [provider]  gabapentin (NEURONTIN) 300 MG capsule Take 300-600 mg by mouth See admin instructions. Take 300 mg in the morning, 300 mg in the afternoon, and 600 mg at bedtime   Yes [provider]  HYDROcodone-acetaminophen (NORCO/VICODIN) 5-325 MG tablet Take 2 tablets by mouth 2 (two) times daily. 10/31/20  Yes [provider]  ipratropium-albuterol (DUONEB) 0.5-2.5 (3) MG/3ML SOLN Inhale 3 mLs into the lungs 2 (two) times daily as needed (shortness of breath). 11/24/21  Yes [provider]  magnesium oxide (MAGOX 400) 400 (240 Mg) MG tablet Take 1 tablet (400 mg total) by mouth daily. 02/06/22  Yes Croitoru, Mihai, MD  metoprolol succinate (TOPROL-XL) 25 MG 24 hr tablet TAKE 1 TABLET BY MOUTH ONCE DAILY. Patient taking differently: Take 12.5 mg by mouth daily. 04/29/21  Yes Josue Hector, MD  Multiple Vitamin (MULTI-VITAMINS) TABS Take 1 tablet by mouth daily.    Yes [provider]  ondansetron (ZOFRAN ODT) 4 MG disintegrating tablet Take 1 tablet (4 mg total) by mouth every 8 (eight) hours as needed for nausea or vomiting. 01/14/21  Yes Lamptey, Myrene Galas, MD  potassium chloride SA (KLOR-CON M) 20 MEQ tablet Take 1 tablet (20 mEq total) by mouth 2 (two) times daily for 7 days, THEN 1 tablet (20 mEq total) daily for 7 days. Take 3 tablets daily when taking lasix. 04/30/22 06/23/88 Yes Josue Hector, MD  predniSONE (DELTASONE) 20 MG tablet Take 1 tablet (20 mg total) by mouth daily with breakfast. 04/29/22  Yes Josue Hector, MD  propafenone (RYTHMOL SR) 225 MG 12 hr capsule Take 1 capsule (225 mg total) by mouth every 8 (eight) hours. 06/22/22  Yes Josue Hector, MD   sertraline (ZOLOFT) 100 MG tablet Take 100 mg by mouth See admin instructions. Take 1 & 1/2 tablets by mouth once daily 05/25/22  Yes [provider]  Turmeric 500 MG CAPS Take 500 mg by mouth 3 (three) times a week.   Yes [provider]  warfarin (COUMADIN) 1 MG tablet Take 3 tablets daily except 4 tablets on Fridays or as directed by coumadin clinic Patient taking differently: Take 1 mg by mouth See admin instructions. Take 3 tablets daily except 4 tablets on Fridays or as directed by coumadin clinic 06/23/22  Yes Josue Hector, MD    Physical Exam: Vitals:   06/23/22 2246 06/23/22 2248 06/23/22 2251 06/23/22 2300  BP: 112/60 (!) 123/57 (!) 144/54 (!) 152/69  Pulse: 66 (!) 43 (!) 58 68  Resp: 18 13 20 $ (!) 28  Temp: 98.8 F (37.1 C)     TempSrc: Oral     SpO2: 98% 100% 100%  100%    Constitutional: NAD, no pallor or cyanosis   Eyes: PERTLA, lids and conjunctivae normal ENMT: Mucous membranes are moist. Posterior pharynx clear of any exudate or lesions.   Neck: supple, no masses  Respiratory: Fine rales bilaterally. No accessory muscle use.  Cardiovascular: S1 & S2 heard, regular rate and rhythm. No extremity edema.   Abdomen: No distension, no tenderness, soft. Bowel sounds active.  Musculoskeletal: no clubbing / cyanosis. No joint deformity upper and lower extremities.   Skin: no significant rashes, lesions, ulcers. Warm, dry, well-perfused. Neurologic: CN 2-12 grossly intact. Moving all extremities. Alert and oriented.  Psychiatric: Calm. Cooperative.    Labs and Imaging on Admission: I have personally reviewed following labs and imaging studies  CBC: Recent Labs  Lab 06/23/22 1417 06/23/22 1900  WBC 21.8* 19.5*  NEUTROABS 17.1*  --   HGB 12.4 12.1  HCT 37.9 38.5  MCV 81.2 83.2  PLT 446* 123XX123*   Basic Metabolic Panel: Recent Labs  Lab 06/23/22 1417 06/23/22 1900  NA 137 137  K 3.2* 2.8*  CL 98 96*  CO2 26 27  GLUCOSE 113* 105*  BUN 19 21*   CREATININE 1.04* 1.03*  CALCIUM 9.7 9.3  MG  --  2.4   GFR: Estimated Creatinine Clearance: 44.1 mL/min (A) (by C-G formula based on SCr of 1.03 mg/dL (H)). Liver Function Tests: Recent Labs  Lab 06/23/22 1417 06/23/22 1900  AST 38 35  ALT 28 27  ALKPHOS 89 86  BILITOT 0.6 0.8  PROT 7.4 7.2  ALBUMIN 4.2 4.1   No results for input(s): "LIPASE", "AMYLASE" in the last 168 hours. No results for input(s): "AMMONIA" in the last 168 hours. Coagulation Profile: Recent Labs  Lab 06/23/22 1507 06/23/22 1900  INR 2.9 2.0*   Cardiac Enzymes: No results for input(s): "CKTOTAL", "CKMB", "CKMBINDEX", "TROPONINI" in the last 168 hours. BNP (last 3 results) No results for input(s): "PROBNP" in the last 8760 hours. HbA1C: No results for input(s): "HGBA1C" in the last 72 hours. CBG: Recent Labs  Lab 06/23/22 1848  GLUCAP 112*   Lipid Profile: No results for input(s): "CHOL", "HDL", "LDLCALC", "TRIG", "CHOLHDL", "LDLDIRECT" in the last 72 hours. Thyroid Function Tests: No results for input(s): "TSH", "T4TOTAL", "FREET4", "T3FREE", "THYROIDAB" in the last 72 hours. Anemia Panel: No results for input(s): "VITAMINB12", "FOLATE", "FERRITIN", "TIBC", "IRON", "RETICCTPCT" in the last 72 hours. Urine analysis:    Component Value Date/Time   COLORURINE YELLOW 11/08/2021 2126   APPEARANCEUR HAZY (A) 11/08/2021 2126   LABSPEC 1.010 11/08/2021 2126   PHURINE 7.0 11/08/2021 2126   GLUCOSEU NEGATIVE 11/08/2021 2126   HGBUR NEGATIVE 11/08/2021 2126   BILIRUBINUR NEGATIVE 11/08/2021 2126   KETONESUR NEGATIVE 11/08/2021 2126   PROTEINUR NEGATIVE 11/08/2021 2126   NITRITE NEGATIVE 11/08/2021 2126   LEUKOCYTESUR NEGATIVE 11/08/2021 2126   Sepsis Labs: @LABRCNTIP$ (procalcitonin:4,lacticidven:4) ) Recent Results (from the past 240 hour(s))  Resp panel by RT-PCR (RSV, Flu A&B, Covid) Anterior Nasal Swab     Status: None   Collection Time: 06/23/22  7:25 PM   Specimen: Anterior Nasal Swab   Result Value Ref Range Status   SARS Coronavirus 2 by RT PCR NEGATIVE NEGATIVE Final    Comment: (NOTE) SARS-CoV-2 target nucleic acids are NOT DETECTED.  The SARS-CoV-2 RNA is generally detectable in upper respiratory specimens during the acute phase of infection. The lowest concentration of SARS-CoV-2 viral copies this assay can detect is 138 copies/mL. A negative result does not preclude SARS-Cov-2 infection and  should not be used as the sole basis for treatment or other patient management decisions. A negative result may occur with  improper specimen collection/handling, submission of specimen other than nasopharyngeal swab, presence of viral mutation(s) within the areas targeted by this assay, and inadequate number of viral copies(<138 copies/mL). A negative result must be combined with clinical observations, patient history, and epidemiological information. The expected result is Negative.  Fact Sheet for Patients:  EntrepreneurPulse.com.au  Fact Sheet for Healthcare Providers:  IncredibleEmployment.be  This test is no t yet approved or cleared by the [place] FDA and  has been authorized for detection and/or diagnosis of SARS-CoV-2 by FDA under an Emergency Use Authorization (EUA). This EUA will remain  in effect (meaning this test can be used) for the duration of the COVID-19 declaration under Section 564(b)(1) of the Act, 21 U.S.C.section 360bbb-3(b)(1), unless the authorization is terminated  or revoked sooner.       Influenza A by PCR NEGATIVE NEGATIVE Final   Influenza B by PCR NEGATIVE NEGATIVE Final    Comment: (NOTE) The Xpert Xpress SARS-CoV-2/FLU/RSV plus assay is intended as an aid in the diagnosis of influenza from Nasopharyngeal swab specimens and should not be used as a sole basis for treatment. Nasal washings and aspirates are unacceptable for Xpert Xpress SARS-CoV-2/FLU/RSV testing.  Fact Sheet for  Patients: EntrepreneurPulse.com.au  Fact Sheet for Healthcare Providers: IncredibleEmployment.be  This test is not yet approved or cleared by the [place] FDA and has been authorized for detection and/or diagnosis of SARS-CoV-2 by FDA under an Emergency Use Authorization (EUA). This EUA will remain in effect (meaning this test can be used) for the duration of the COVID-19 declaration under Section 564(b)(1) of the Act, 21 U.S.C. section 360bbb-3(b)(1), unless the authorization is terminated or revoked.     Resp Syncytial Virus by PCR NEGATIVE NEGATIVE Final    Comment: (NOTE) Fact Sheet for Patients: EntrepreneurPulse.com.au  Fact Sheet for Healthcare Providers: IncredibleEmployment.be  This test is not yet approved or cleared by the [place] FDA and has been authorized for detection and/or diagnosis of SARS-CoV-2 by FDA under an Emergency Use Authorization (EUA). This EUA will remain in effect (meaning this test can be used) for the duration of the COVID-19 declaration under Section 564(b)(1) of the Act, 21 U.S.C. section 360bbb-3(b)(1), unless the authorization is terminated or revoked.  Performed at [facility], [address], [place], [place] [address]      Radiological Exams on Admission: DG Chest 2 View  Result Date: 06/23/2022 CLINICAL DATA:  Chest pain and shortness of breath EXAM: CHEST - 2 VIEW COMPARISON:  02/03/2022 FINDINGS: Cardiac shadow is within normal limits. Postsurgical changes are again seen and stable. Lungs are well aerated bilaterally. No focal infiltrate or effusion is noted. Previously seen nipple shadows are less well appreciated on today's exam. No bony abnormality is noted. IMPRESSION: No acute abnormality noted. Electronically Signed   By: Inez Catalina M.D.   On: 06/23/2022 18:47    EKG: Independently reviewed. Undetermined rhythm, rate 138, LAD.   Assessment/Plan    1. Atrial fibrillation with RVR  - S/p successful DCCV in ED  - Continue warfarin, metoprolol, and propafenone, optimize electrolytes, consult cardiology in AM    2. Hypokalemia  - Replacing    3. Lymphadenopathy  - Undergoing workup with oncology, scheduled for lymph node biopsy    4. Anxiety  - Continue Zoloft and as-needed Valium   5. COPD; chronic hypoxic respiratory failure  - Not  in exacerbation on admission  - Continue ICS-LAMA-LABA, supplemental O2, and as-needed SABA    DVT prophylaxis: Warfarin  Code Status: Full  Level of Care: Level of care: Stepdown Family Communication: none present  Disposition Plan:  Patient is from: home  Anticipated d/c is to: home  Anticipated d/c date is: 2/21 or 06/25/22  Patient currently: pending cardiology consultation  Consults called: none  Admission status: Observation     Vianne Bulls, MD [facility]  06/24/2022, 12:36 AM

## 2022-06-24 NOTE — Discharge Instructions (Addendum)
[place]: [place], [place]  Provides hot meals seven days a week and twice (lunch and afternoon) on Saturdays and  Sundays. [employer] serves lunch Monday - Friday between 11 a.m. and noon. [place] [place] [id number] Come Tuesdays and Wednesdays to sign up from 2-4 pm with I.D. and proof of address.  Distribute food every other Thursday from 9-12.  [facility] - [place] 11:30-1:00 [facility] across from [place], behind the free clinic in [place].  It is a self-serve and the community can drop things in it. "Give if you can ... take if you  need". Anyone can give and anyone can go by. P a g e  [place] - Updated April 2020  [place], [place]. You have to call the number and leave a message and someone will call  you back to set up a time to come and pick up. [employer] - https://bit.ly/3cO1Az1 Bagged lunches/breakfast - Parents can request service online and then pick up their  meals Monday-Friday from 11:30-12:00. [facility] delivers the meals each day. They sort,  combine and place meals for each family on tables for pickup so that they adhere to social  distancing.  [facility] in [place], [place]: [address] [place] [phone] Distribution is from 10 a.m. to 1 p.m. the 2nd and 4th Tuesdays. Clients must sign up before  the day of distribution. [employer], [place]: [place] [facility] hours: 9 a.m.-11:45 a.m. Mondays, Wednesdays, Thursdays, and Fridays. The  pantry is closed on Tuesdays. [facility] of [place] - [phone]  Services Offered: Meals on [place] care, at home assisted living,  [employer] for [employer],  transportation [facility] [employer] 4th Wednesday of each month 9:30 a.m. to 12 noon [place], [place] [id number] Services Offered: Application and I.D are required. You must have a copy of a current bill.  Call on Tuesdays for appointment and complete application between [id number].  They will purchase fresh food and you are able to pick the food up in the same afternoon. [facility] [employer] - [place] [address] [place]. [id number] Services Offered: Food assistance on Tuesdays from 2-4 pm with photo I.D. and  proof of address    You will be scheduled for cardia CTA (cat Scan) in next few weeks.  Dr. Kyla Balzarine office will call with date and time and instructions.

## 2022-06-24 NOTE — Telephone Encounter (Signed)
Pt asked that I send a message to let Dr. [name] know she is in the hospital.

## 2022-06-24 NOTE — Consult Note (Addendum)
Cardiology Consultation   Patient ID: Mallory Sutton MRN: [id number]; DOB: 07-27-1960  Admit date: 06/23/2022 Date of Consult: 06/24/2022  PCP:  Valentino Nose, Dewy Rose Providers Cardiologist:  Jenkins Rouge, MD        Patient Profile:   Mallory Sutton is a 62 y.o. female with a hx of congenital heart disease with aortic coarctation repair age 35 not felt to be severe enough for repeat surgery in 2016 when she had bicuspid aortic valve with aortic aneurysm s/p aortic valve and arch repair in 2016 with MAZE and LAA procedure (listed as clipping vs occlusion in other places) but repair of CoA was not performed at [facility] in the redo surgery, persistent left sided SVC, paroxysmal atrial fibrillation/flutter, cutaneous vasculitis, anxiety, chronic lung disease with decreased DLCO with chronic respiratory failure on home O2, chornic diastolic CHF, endometriosis, recent abnormal lung nodules/lymph nodes with PET scan concerning for lymphoproliferative disease pending lymph node biopsy who is being seen 06/24/2022 for the evaluation of atrial fib/flutter at the request of Dr. Denton Brick.  History of Present Illness:   Mallory Sutton has extremely complex history above and has been followed by Dr. [name] and Dr. Lovena Le. She was previously on amiodarone in the past but more recently managed with Rhythmol. She has been having issues with breakthrough arrhythmia the last few months. Per last EP OV 06/04/22, Dr. Lovena Le reported her QT interval has been felt too long for dofetilide, but possibly acceptable off Rhythmol.  He also discussed AVN ablation and PPM. No specific changes made at that time, continued on Rhythmol. She has multiple other medical problems including neuropathy, livedo reticularis, vascular rash treated with steroids in the past, hypoxemic respiratory failure with abnormal PFTs, requiring home O2. COPD is listed in her chart but she reports she is a nonsmoker but was around smoke before.  Last full echo 03/2022 showed EF 65-70%, G2DD, "History of surgical aortic coactation repair in childhood. Descedning aortic not well defined, but there is turbulent flow and gradient of 47 mmHg noted suggesting restenosis. Consider CTA for further evaluation," mild LA dilation, mild MR, mild-moderate AI, cannot exclude perivalvular component. Repeat limited bubble study 05/2022 showed EF 60-65%, moderate LVH, bubble study negative.  More recently she has had a PET scan due to abnormal CT of the chest which was suggestive of lymphoproliferative disorder and was pending lymph node biopsy next Friday 3/1. However, she was seen in the office yesterday with complaints of worsening palpitations in rapid afib/flutter. She was sent to the ED for further evaluation and management. She underwent DCCV in the ED., INR had been 2.8 on 1/2, 2.9 on 2/20, and 2.0 yesterday. Labs also notable for leukocytosis of 19k, thrombocytosis of 466, hypokalemia of 2.8, AKI with BUN 21,  Cr 1.03, improved today. Mg wnl. D-dimer negative. CXR NAD. Cardiology consulted for further management. Her Rhythmol was held since admission, cited as held due to HR 50s. She reports some improvement in dyspnea back in sinus rhythm but still feels she is not back to baseline. She also reports a generalized sense of chronic persistent chest tightness back to March of last year. This was relieved with fentanyl in ED. Troponins are negative. Lasix not yet resumed - pt reports in the past when she was trialed off this, she gained 9lb of fluid. Likely need to resume.   Past Medical History:  Diagnosis Date   Aortic stenosis    ATRIAL ARRHYTHMIAS    Bicuspid aortic  valve    CHF (congestive heart failure) (HCC)    COARCTATION OF AORTA    ENDOMETRIOSIS    Heart murmur    Paroxysmal atrial fibrillation (HCC)    Shortness of breath    THORACIC AORTIC ANEURYSM    Vasculitis ([place])     Past Surgical History:  Procedure Laterality Date    CARDIOVERSION N/A 08/29/2019   Procedure: CARDIOVERSION;  Surgeon: Arnoldo Lenis, MD;  Location: [facility];  Service: Endoscopy;  Laterality: N/A;   coarctation repair and residual restenosis     KNEE SURGERY     TEE WITHOUT CARDIOVERSION N/A 07/12/2019   Procedure: TRANSESOPHAGEAL ECHOCARDIOGRAM (TEE);  Surgeon: Sueanne Margarita, MD;  Location[facility] ENDOSCOPY;  Service: Cardiovascular;  Laterality: N/A;   TEE WITHOUT CARDIOVERSION N/A 08/29/2019   Procedure: TRANSESOPHAGEAL ECHOCARDIOGRAM (TEE) WITH PROPOFOL;  Surgeon: Arnoldo Lenis, MD;  Location: [facility];  Service: Endoscopy;  Laterality: N/A;     Home Medications:  Prior to Admission medications   Medication Sig Start Date End Date Taking? Authorizing Provider  albuterol (VENTOLIN HFA) 108 (90 Base) MCG/ACT inhaler Inhale 2 puffs into the lungs every 6 (six) hours as needed for wheezing or shortness of breath. 10/06/21  Yes Freddi Starr, MD  aspirin EC 81 MG tablet Take 81 mg by mouth daily.   Yes [provider]  Budeson-Glycopyrrol-Formoterol (BREZTRI AEROSPHERE) 160-9-4.8 MCG/ACT AERO Inhale 2 puffs into the lungs 2 (two) times daily.   Yes [provider]  calcium-vitamin D (OSCAL WITH D) 500-5 MG-MCG tablet Take 1 tablet by mouth daily. 11/10/21  Yes Barton Dubois, MD  diazepam (VALIUM) 10 MG tablet Take 10 mg by mouth daily as needed for sleep. 07/04/21  Yes [provider]  feeding supplement (ENSURE ENLIVE / ENSURE PLUS) LIQD Take 237 mLs by mouth 2 (two) times daily between meals. 11/10/21  Yes Barton Dubois, MD  furosemide (LASIX) 40 MG tablet Take 80 mg by mouth daily.   Yes [provider]  gabapentin (NEURONTIN) 300 MG capsule Take 300-600 mg by mouth See admin instructions. Take 300 mg in the morning, 300 mg in the afternoon, and 600 mg at bedtime   Yes [provider]  HYDROcodone-acetaminophen (NORCO/VICODIN) 5-325 MG tablet Take 2 tablets by mouth 2 (two) times daily. 10/31/20   Yes [provider]  ipratropium-albuterol (DUONEB) 0.5-2.5 (3) MG/3ML SOLN Inhale 3 mLs into the lungs 2 (two) times daily as needed (shortness of breath). 11/24/21  Yes [provider]  magnesium oxide (MAGOX 400) 400 (240 Mg) MG tablet Take 1 tablet (400 mg total) by mouth daily. 02/06/22  Yes Croitoru, Mihai, MD  metoprolol succinate (TOPROL-XL) 25 MG 24 hr tablet TAKE 1 TABLET BY MOUTH ONCE DAILY. Patient taking differently: Take 12.5 mg by mouth daily. 04/29/21  Yes Josue Hector, MD  Multiple Vitamin (MULTI-VITAMINS) TABS Take 1 tablet by mouth daily.    Yes [provider]  ondansetron (ZOFRAN ODT) 4 MG disintegrating tablet Take 1 tablet (4 mg total) by mouth every 8 (eight) hours as needed for nausea or vomiting. 01/14/21  Yes Lamptey, Myrene Galas, MD  potassium chloride SA (KLOR-CON M) 20 MEQ tablet Take 1 tablet (20 mEq total) by mouth 2 (two) times daily for 7 days, THEN 1 tablet (20 mEq total) daily for 7 days. Take 3 tablets daily when taking lasix. 04/30/22 06/23/88 Yes Josue Hector, MD  predniSONE (DELTASONE) 20 MG tablet Take 1 tablet (20 mg total) by mouth  daily with breakfast. 04/29/22  Yes Josue Hector, MD  propafenone (RYTHMOL SR) 225 MG 12 hr capsule Take 1 capsule (225 mg total) by mouth every 8 (eight) hours. 06/22/22  Yes Josue Hector, MD  sertraline (ZOLOFT) 100 MG tablet Take 100 mg by mouth See admin instructions. Take 1 & 1/2 tablets by mouth once daily 05/25/22  Yes [provider]  Turmeric 500 MG CAPS Take 500 mg by mouth 3 (three) times a week.   Yes [provider]  warfarin (COUMADIN) 1 MG tablet Take 3 tablets daily except 4 tablets on Fridays or as directed by coumadin clinic Patient taking differently: Take 1 mg by mouth See admin instructions. Take 3 tablets daily except 4 tablets on Fridays or as directed by coumadin clinic 06/23/22  Yes Josue Hector, MD    Inpatient Medications: Scheduled Meds:  aspirin EC   81 mg Oral Daily   Chlorhexidine Gluconate Cloth  6 each Topical Q0600   gabapentin  300 mg Oral BID AC   gabapentin  600 mg Oral QHS   metoprolol succinate  12.5 mg Oral Daily   mometasone-formoterol  2 puff Inhalation BID   predniSONE  20 mg Oral Q breakfast   propafenone  225 mg Oral Q8H   sertraline  150 mg Oral Daily   sodium chloride flush  3 mL Intravenous Q12H   umeclidinium bromide  1 puff Inhalation Daily   warfarin  3 mg Oral Once per day on Sun Mon Tue Wed Thu Sat   [START ON 06/26/2022] warfarin  4 mg Oral Q Fri   Warfarin - Pharmacist Dosing Inpatient   Does not apply q1600   Continuous Infusions:  PRN Meds: acetaminophen **OR** acetaminophen, albuterol, diazepam, fentaNYL (SUBLIMAZE) injection, HYDROcodone-acetaminophen, polyethylene glycol  Allergies:    Allergies  Allergen Reactions   Amiodarone Nausea Only   Penicillins Hives    Has patient had a PCN reaction causing immediate rash, facial/tongue/throat swelling, SOB or lightheadedness with hypotension: YES Has patient had a PCN reaction causing severe rash involving mucus membranes or skin necrosis: NO Has patient had a PCN reaction that required hospitalization NO Has patient had a PCN reaction occurring within the last 10 years: NO If all of the above answers are "NO", then may proceed with Cephalosporin use.     Social History:   Social History   Socioeconomic History   Marital status: Married    Spouse name: Not on file   Number of children: Not on file   Years of education: Not on file   Highest education level: Not on file  Occupational History   Not on file  Tobacco Use   Smoking status: Never    Passive exposure: Past   Smokeless tobacco: Never  Vaping Use   Vaping Use: Never used  Substance and Sexual Activity   Alcohol use: Yes    Alcohol/week: 0.0 standard drinks of alcohol    Comment: 3 beers 3 nights a week   Drug use: No   Sexual activity: Never  Other Topics Concern   Not on  file  Social History Narrative   Not on file   Social Determinants of Health   Financial Resource Strain: Not on file  Food Insecurity: Food Insecurity Present (06/24/2022)   Hunger Vital Sign    Worried About Running Out of Food in the Last Year: Sometimes true    Ran Out of Food in the Last Year: Sometimes true  Transportation Needs: No  Transportation Needs (06/24/2022)   PRAPARE - Hydrologist (Medical): No    Lack of Transportation (Non-Medical): No  Physical Activity: Not on file  Stress: Not on file  Social Connections: Not on file  Intimate Partner Violence: Not At Risk (06/24/2022)   Humiliation, Afraid, Rape, and Kick questionnaire    Fear of Current or Ex-Partner: No    Emotionally Abused: No    Physically Abused: No    Sexually Abused: No    Family History:    Family History  Problem Relation Age of Onset   Suicidality Mother    Mental illness Mother    Drug abuse Brother    Healthy Daughter    Healthy Son      ROS:  Please see the history of present illness.  All other ROS reviewed and negative.     Physical Exam/Data:   Vitals:   06/24/22 0735 06/24/22 0800 06/24/22 0851 06/24/22 0910  BP:  (!) 171/85    Pulse:  73    Resp:  (!) 29    Temp: 98 F (36.7 C)     TempSrc: Oral     SpO2:  100% 100% 100%  Weight:      Height:        Intake/Output Summary (Last 24 hours) at 06/24/2022 0919 Last data filed at 06/24/2022 0359 Gross per 24 hour  Intake 603.34 ml  Output --  Net 603.34 ml      06/24/2022    3:00 AM 06/23/2022    2:57 PM 06/23/2022    1:35 PM  Last 3 Weights  Weight (lbs) 106 lb 7.7 oz 106 lb 105 lb 9.6 oz  Weight (kg) 48.3 kg 48.081 kg 47.9 kg     Body mass index is 18.28 kg/m.  General: Well developed, well nourished, in no acute distress. Head: Normocephalic, atraumatic, sclera non-icteric, no xanthomas, nares are without discharge. Neck: Negative for carotid bruits. JVP not elevated. Lungs: Diffusely  coarse BS without overt wheezes or rhonchi. Breathing is unlabored. Heart: RRR S1 S2 loud SEM without rubs or gallops.  Abdomen: Soft, non-tender, non-distended with normoactive bowel sounds. No rebound/guarding. Extremities: No clubbing or cyanosis. No edema. Distal pedal pulses are 2+ and equal bilaterally. Neuro: Alert and oriented X 3. Moves all extremities spontaneously. Psych:  Responds to questions appropriately with a normal affect.  EKG:  The EKG was personally reviewed and demonstrates:  Suspected atrial flutter vs SVT 138bpm, nonspecific STTW changes, do not suspect acute MI  Telemetry:  Telemetry was personally reviewed and demonstrates:  NSR with PACs, PVCs  Relevant CV Studies: Please see EMR for numerous studies  Laboratory Data:  High Sensitivity Troponin:   Recent Labs  Lab 06/23/22 1900 06/23/22 2030  TROPONINIHS 14 11     Chemistry Recent Labs  Lab 06/23/22 1417 06/23/22 1900 06/24/22 0532  NA 137 137 139  K 3.2* 2.8* 4.1  CL 98 96* 106  CO2 26 27 26  $ GLUCOSE 113* 105* 103*  BUN 19 21* 19  CREATININE 1.04* 1.03* 0.82  CALCIUM 9.7 9.3 7.9*  MG  --  2.4 2.2  GFRNONAA >60 >60 >60  ANIONGAP 13 14 7    $ Recent Labs  Lab 06/23/22 1417 06/23/22 1900  PROT 7.4 7.2  ALBUMIN 4.2 4.1  AST 38 35  ALT 28 27  ALKPHOS 89 86  BILITOT 0.6 0.8   Lipids No results for input(s): "CHOL", "TRIG", "HDL", "LABVLDL", "LDLCALC", "CHOLHDL" in the  last 168 hours.  Hematology Recent Labs  Lab 06/23/22 1417 06/23/22 1900 06/24/22 0532  WBC 21.8* 19.5* 19.8*  RBC 4.67 4.63 3.63*  HGB 12.4 12.1 9.6*  HCT 37.9 38.5 31.1*  MCV 81.2 83.2 85.7  MCH 26.6 26.1 26.4  MCHC 32.7 31.4 30.9  RDW 15.4 15.6* 15.9*  PLT 446* 466* 312   Thyroid No results for input(s): "TSH", "FREET4" in the last 168 hours.  BNPNo results for input(s): "BNP", "PROBNP" in the last 168 hours.  DDimer  Recent Labs  Lab 06/23/22 1900  DDIMER <0.27     Radiology/Studies:  DG Chest 2  View  Result Date: 06/23/2022 CLINICAL DATA:  Chest pain and shortness of breath EXAM: CHEST - 2 VIEW COMPARISON:  02/03/2022 FINDINGS: Cardiac shadow is within normal limits. Postsurgical changes are again seen and stable. Lungs are well aerated bilaterally. No focal infiltrate or effusion is noted. Previously seen nipple shadows are less well appreciated on today's exam. No bony abnormality is noted. IMPRESSION: No acute abnormality noted. Electronically Signed   By: Inez Catalina M.D.   On: 06/23/2022 18:47     Assessment and Plan:   # Preop cardiac risk stratification prior to lymph node biopsy -Patient has lymph node biopsy scheduled on 07/03/2022. Hold Coumadin for 5 days prior to procedure, begin therapeutic Lovenox bridging 3 days prior to the procedure (when INR is less than 2) and restart warfarin immediately after the procedure.  # Paroxysmal atrial fibrillation s/p DCCV in 2021 and 2024, s/p maze procedure and LAAO surgical occlusion in 2016 -Discussed the patient's case with on-call EP attending, Dr. Virl Axe and following are the recommendations. -Patient had breakthrough atrial arrhythmias despite being on propafenone.  She follows up with EP, Dr. Crissie Sickles who recommended Tikosyn loading when off propafenone and the last option being AV nodal ablation with PPM placement (patient has persistent left sided SVC). Ideally, she would benefit from short-term amiodarone therapy rather than propafenone due to worsening chest tightness x 1 year. will obtain outpatient CTA cardiac to rule out CAD. Patient refused to be on amiodarone due to refractory nausea/vomiting in the past. Will continue propafenone 225 mg every 8 hours for now and she will need to follow-up with EP as outpatient for further evaluation. Continue metoprolol succinate 12.5 mg once daily. -Patient had surgical occlusion of left atrial appendage in 2016. TEE in 2021 showed residual left atrial appendage with velocities 20  cm/s. 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 The pressure gradient across the descending aorta was 47 mmHg.   I have spent a total of 65 minutes with patient reviewing chart , telemetry, EKGs, labs and examining patient as well as establishing an assessment and plan that was discussed with the patient.  > 50% of time was spent in direct patient care.    Bich Mchaney Fidel Levy, MD [place]  [facility]  1:28 PM

## 2022-06-24 NOTE — Progress Notes (Signed)
[facility]  Inr 1.8; given superficial location of nodes will proceed tomorrow. Heparin stopped at midnight and npo midnight.  Updated team.  Curlene Labrum, MD

## 2022-06-24 NOTE — Progress Notes (Signed)
ANTICOAGULATION CONSULT NOTE - Initial Consult  Pharmacy Consult for warfarin Indication: atrial fibrillation  Allergies  Allergen Reactions   Amiodarone Nausea Only   Penicillins Hives    Has patient had a PCN reaction causing immediate rash, facial/tongue/throat swelling, SOB or lightheadedness with hypotension: YES Has patient had a PCN reaction causing severe rash involving mucus membranes or skin necrosis: NO Has patient had a PCN reaction that required hospitalization NO Has patient had a PCN reaction occurring within the last 10 years: NO If all of the above answers are "NO", then may proceed with Cephalosporin use.    Vital Signs: Temp: 98.8 F (37.1 C) (02/20 2246) Temp Source: Oral (02/20 2246) BP: 152/69 (02/20 2300) Pulse Rate: 68 (02/20 2300)  Labs: Recent Labs    06/23/22 1417 06/23/22 1507 06/23/22 1900 06/23/22 2030  HGB 12.4  --  12.1  --   HCT 37.9  --  38.5  --   PLT 446*  --  466*  --   LABPROT  --   --  22.6*  --   INR  --  2.9 2.0*  --   CREATININE 1.04*  --  1.03*  --   TROPONINIHS  --   --  14 11    Estimated Creatinine Clearance: 44.1 mL/min (A) (by C-G formula based on SCr of 1.03 mg/dL (H)).   Medical History: Past Medical History:  Diagnosis Date   Aortic stenosis    ATRIAL ARRHYTHMIAS    Bicuspid aortic valve    CHF (congestive heart failure) (HCC)    COARCTATION OF AORTA    ENDOMETRIOSIS    Heart murmur    Paroxysmal atrial fibrillation (HCC)    Shortness of breath    THORACIC AORTIC ANEURYSM    Vasculitis ([place])     Assessment: 62yo female was seen at cards office today >> rec'd to go to ED for further Afib monitoring prior to adjusting anti-arrhythmic meds, to continue Coumadin; current INR therapeutic with last dose of Coumadin taken 2/19.  Goal of Therapy:  INR 2-3   Plan:  Continue home Coumadin regimen of 60m daily except 478mFri. Monitor INR.  VeWynona NeatPharmD, BCPS  06/24/2022,12:36 AM

## 2022-06-24 NOTE — Progress Notes (Signed)
Patient consumed salad for dinner. Consent signed and placed on chart. Hep drip infusing at this time. Will endorse to start NPO and hold Hep at midnight.

## 2022-06-24 NOTE — Progress Notes (Addendum)
[facility] Progress Note     Subjective: Patient seen yesterday and planned for lymph node biopsy next week but unfortunately she required cardioversion. Cardiology does not want her off anticoagulation now for 4 weeks if she were to go as outpatient. She is currently here on heparin and Dr. Denton Brick notified me about this fact.  She says she is feeling better and is in sinus rhythm.   Objective: Vital signs in last 24 hours: Temp:  [97.7 F (36.5 C)-98.8 F (37.1 C)] 98.1 F (36.7 C) (02/21 1105) Pulse Rate:  [43-141] 86 (02/21 1300) Resp:  [13-32] 23 (02/21 1300) BP: (104-171)/(48-108) 129/64 (02/21 1300) SpO2:  [98 %-100 %] 100 % (02/21 1300) Weight:  [48.1 kg-48.3 kg] 48.3 kg (02/21 0300) Last BM Date : 06/23/22  Intake/Output from previous day: 02/20 0701 - 02/21 0700 In: 603.3 [I.V.:355.8; IV Piggyback:247.6] Out: -  Intake/Output this shift: No intake/output data recorded.  General appearance: alert and no distress Resp: normal work of breathing, O 2 in place   Lab Results:  Recent Labs    06/23/22 1900 06/24/22 0532  WBC 19.5* 19.8*  HGB 12.1 9.6*  HCT 38.5 31.1*  PLT 466* 312   BMET Recent Labs    06/23/22 1900 06/24/22 0532  NA 137 139  K 2.8* 4.1  CL 96* 106  CO2 27 26  GLUCOSE 105* 103*  BUN 21* 19  CREATININE 1.03* 0.82  CALCIUM 9.3 7.9*   PT/INR Recent Labs    06/23/22 1900 06/24/22 0532  LABPROT 22.6* 20.7*  INR 2.0* 1.8*    Studies/Results: DG Chest 2 View  Result Date: 06/23/2022 CLINICAL DATA:  Chest pain and shortness of breath EXAM: CHEST - 2 VIEW COMPARISON:  02/03/2022 FINDINGS: Cardiac shadow is within normal limits. Postsurgical changes are again seen and stable. Lungs are well aerated bilaterally. No focal infiltrate or effusion is noted. Previously seen nipple shadows are less well appreciated on today's exam. No bony abnormality is noted. IMPRESSION: No acute abnormality noted. Electronically Signed   By:  Inez Catalina M.D.   On: 06/23/2022 18:47    Anti-infectives: Anti-infectives (From admission, onward)    None       Assessment/Plan: Patient with multiple medical issues including A fib, just cardioverted. She has adenopathy and needs a lymph node biopsy. She is currently on heparin and her INR was 1.8 this AM.  She took coumadin yesterday thought.    Cardiology thinks it will be safer to do biopsy and hold heparin for the 6 hours and restart.  She has a leukocytosis-I presume from her steroid use versus lymphoma?  She is on her home O2.    I am trying to work it out for her to get the biopsy tomorrow versus Friday. If it is Friday, Dr. Arnoldo Morale will do the biopsy. The patient and family are aware and understanding.   Greater than 50% of the 35 minute visit was spent in counseling/ coordination of care regarding the biopsy and if this can be done and scheduling it.    LOS: 0 days    Virl Cagey 06/24/2022

## 2022-06-24 NOTE — TOC Progression Note (Addendum)
  Transition of Care [facility]) Screening Note   Patient Details  Name: Mallory Sutton Date of Birth: 05-30-1960   Transition of [facility]) CM/SW Contact:    Shade Flood, LCSW Phone Number: 06/24/2022, 10:27 AM    Resource list for food insecurity added to AVS to address SDOH.  Transition of Care Department [facility]) has reviewed patient and no TOC needs have been identified at this time. We will continue to monitor patient advancement through interdisciplinary progression rounds. If new patient transition needs arise, please place a TOC consult.

## 2022-06-24 NOTE — Progress Notes (Signed)
PROGRESS NOTE     Mallory Sutton, is a 62 y.o. female, DOB - 1961/02/15, [id number]  Admit date - 06/23/2022   Admitting Physician Vianne Bulls, MD  Outpatient Primary MD for the patient is Valentino Nose, FNP  LOS - 0  Chief Complaint  Patient presents with   Atrial Fibrillation        Brief Narrative:  62 y.o. 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 age 82, aortic valve and arch repair in 2016, atrial fibrillation on warfarin and status post MAZE and left atrial appendage occlusion, anxiety, COPD, and chronic hypoxic respiratory failure admitted on 06/23/22 with A-fib with RVR status post cardioversion on 06/23/2022     -Assessment and Plan: 1)Persistent Afib with RVR--status post cardioversion on 06/24/2022 -Paroxysmal atrial fibrillation s/p DCCV in 2021 and 2024, s/p maze procedure and LAAO surgical occlusion in 2016  -Per cardiologist okay to continue metoprolol and propafenone at this time -Patient does Not want to use amiodarone -Cardiology rec follow-up with EP cardiology -Patient had surgical occlusion of left atrial appendage in 2016. TEE in 2021 showed residual left atrial appendage with velocities 20 cm/s. 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 The  pressure gradient across the descending aorta was 47 mmHg.  4) possible lymphoma--- lymphadenopathy noted previously evaluated by oncologist Dr. Delton Coombes  general surgeon Dr. Constance Haw trying to figure out timing of lymph node biopsy because anticoagulation will have to be held and patient will need heparin bridge -  5) anxiety disorder--- continue benzos and Zoloft  6) chronic hypoxic respiratory failure secondary to COPD-- -appears stable -Oxygen at 2 L via nasal cannula -Continue bronchodilators  Status is: Inpatient   Disposition: The patient is from: Home              Anticipated d/c is to: Home              Anticipated d/c date is: 2 days              Patient currently is not medically stable to d/c. Barriers: Not Clinically Stable-   Code Status :  -  Code Status: Full Code   Family Communication:    NA (patient is alert, awake and coherent)   DVT Prophylaxis  :   - SCDs /Coumadin/Heparin    Lab Results  Component Value Date   PLT 312 06/24/2022    Inpatient Medications  Scheduled Meds:  aspirin EC  81 mg Oral Daily   Chlorhexidine Gluconate Cloth  6 each Topical Q0600   gabapentin  300 mg Oral BID AC   gabapentin  600 mg Oral QHS   metoprolol succinate  12.5 mg Oral Daily   mometasone-formoterol  2 puff Inhalation BID   pantoprazole  40 mg Oral Daily   predniSONE  20 mg Oral  Q breakfast   propafenone  225 mg Oral Q8H   sertraline  150 mg Oral Daily   sodium chloride flush  3 mL Intravenous Q12H   umeclidinium bromide  1 puff Inhalation Daily   Continuous Infusions: PRN Meds:.acetaminophen **OR** acetaminophen, albuterol, diazepam, fentaNYL (SUBLIMAZE) injection, HYDROcodone-acetaminophen, polyethylene glycol   Anti-infectives (From admission, onward)    None       Subjective: Mallory Sutton today has no fevers, no emesis,  No chest pain,   - No dizziness no palpitations  Objective: Vitals:   06/24/22 1100 06/24/22 1105 06/24/22 1200 06/24/22 1300   BP: 134/64  (!) 112/58 129/64  Pulse: 62 66 82 86  Resp: (!) 24 20 (!) 28 (!) 23  Temp:  98.1 F (36.7 C)    TempSrc:  Oral    SpO2: 99% 99% 100% 100%  Weight:      Height:        Intake/Output Summary (Last 24 hours) at 06/24/2022 1408 Last data filed at 06/24/2022 0359 Gross per 24 hour  Intake 603.34 ml  Output --  Net 603.34 ml   Filed Weights   06/24/22 0300  Weight: 48.3 kg   Physical Exam Gen:- Awake Alert, in no acute distress HEENT:- [place].AT, No sclera icterus Nose- [place] 2L/min Neck-Supple Neck,No JVD,.  Lungs-  fair symmetrical air movement, no wheezing CV- S1, S2 normal, regular , prior Sternotomy Abd-  +ve B.Sounds, Abd Soft, No tenderness,    Extremity/Skin:- No  edema, pedal pulses present  Psych-affect is appropriate, oriented x3 Neuro-no new focal deficits, no tremors  Data Reviewed: I have personally reviewed following labs and imaging studies  CBC: Recent Labs  Lab 06/23/22 1417 06/23/22 1900 06/24/22 0532  WBC 21.8* 19.5* 19.8*  NEUTROABS 17.1*  --   --   HGB 12.4 12.1 9.6*  HCT 37.9 38.5 31.1*  MCV 81.2 83.2 85.7  PLT 446* 466* [id number]   Basic Metabolic Panel: Recent Labs  Lab 06/23/22 1417 06/23/22 1900 06/24/22 0532  NA 137 137 139  K 3.2* 2.8* 4.1  CL 98 96* 106  CO2 26 27 26  $ GLUCOSE 113* 105* 103*  BUN 19 21* 19  CREATININE 1.04* 1.03* 0.82  CALCIUM 9.7 9.3 7.9*  MG  --  2.4 2.2   GFR: Estimated Creatinine Clearance: 55.6 mL/min (by C-G formula based on SCr of 0.82 mg/dL). Liver Function Tests: Recent Labs  Lab 06/23/22 1417 06/23/22 1900  AST 38 35  ALT 28 27  ALKPHOS 89 86  BILITOT 0.6 0.8  PROT 7.4 7.2  ALBUMIN 4.2 4.1   Recent Results (from the past 240 hour(s))  Resp panel by RT-PCR (RSV, Flu A&B, Covid) Anterior Nasal Swab     Status: None   Collection Time: 06/23/22  7:25 PM   Specimen: Anterior Nasal Swab  Result Value Ref Range Status   SARS Coronavirus 2 by RT PCR NEGATIVE NEGATIVE Final    Comment:  (NOTE) SARS-CoV-2 target nucleic acids are NOT DETECTED.  The SARS-CoV-2 RNA is generally detectable in upper respiratory specimens during the acute phase of infection. The lowest concentration of SARS-CoV-2 viral copies this assay can detect is 138 copies/mL. A negative result does not preclude SARS-Cov-2 infection and should not be used as the sole basis for treatment or other patient management decisions. A negative result may occur with  improper specimen collection/handling, submission of specimen other than nasopharyngeal swab, presence of viral mutation(s) within the areas targeted by this assay, and inadequate number  of viral copies(<138 copies/mL). A negative result must be combined with clinical observations, patient history, and epidemiological information. The expected result is Negative.  Fact Sheet for Patients:  EntrepreneurPulse.com.au  Fact Sheet for Healthcare Providers:  IncredibleEmployment.be  This test is no t yet approved or cleared by the [place] FDA and  has been authorized for detection and/or diagnosis of SARS-CoV-2 by FDA under an Emergency Use Authorization (EUA). This EUA will remain  in effect (meaning this test can be used) for the duration of the COVID-19 declaration under Section 564(b)(1) of the Act, 21 U.S.C.section 360bbb-3(b)(1), unless the authorization is terminated  or revoked sooner.       Influenza A by PCR NEGATIVE NEGATIVE Final   Influenza B by PCR NEGATIVE NEGATIVE Final    Comment: (NOTE) The Xpert Xpress SARS-CoV-2/FLU/RSV plus assay is intended as an aid in the diagnosis of influenza from Nasopharyngeal swab specimens and should not be used as a sole basis for treatment. Nasal washings and aspirates are unacceptable for Xpert Xpress SARS-CoV-2/FLU/RSV testing.  Fact Sheet for Patients: EntrepreneurPulse.com.au  Fact Sheet for Healthcare  Providers: IncredibleEmployment.be  This test is not yet approved or cleared by the [place] FDA and has been authorized for detection and/or diagnosis of SARS-CoV-2 by FDA under an Emergency Use Authorization (EUA). This EUA will remain in effect (meaning this test can be used) for the duration of the COVID-19 declaration under Section 564(b)(1) of the Act, 21 U.S.C. section 360bbb-3(b)(1), unless the authorization is terminated or revoked.     Resp Syncytial Virus by PCR NEGATIVE NEGATIVE Final    Comment: (NOTE) Fact Sheet for Patients: EntrepreneurPulse.com.au  Fact Sheet for Healthcare Providers: IncredibleEmployment.be  This test is not yet approved or cleared by the [place] FDA and has been authorized for detection and/or diagnosis of SARS-CoV-2 by FDA under an Emergency Use Authorization (EUA). This EUA will remain in effect (meaning this test can be used) for the duration of the COVID-19 declaration under Section 564(b)(1) of the Act, 21 U.S.C. section 360bbb-3(b)(1), unless the authorization is terminated or revoked.  Performed at [facility], [address] [place], [place] [address]   MRSA Next Gen by PCR, Nasal     Status: None   Collection Time: 06/24/22  1:43 AM   Specimen: Nasal Mucosa; Nasal Swab  Result Value Ref Range Status   MRSA by PCR Next Gen NOT DETECTED NOT DETECTED Final    Comment: (NOTE) The GeneXpert MRSA Assay (FDA approved for NASAL specimens only), is one component of a comprehensive MRSA colonization surveillance program. It is not intended to diagnose MRSA infection nor to guide or monitor treatment for MRSA infections. Test performance is not FDA approved in patients less than 55 years old. Performed at [facility], [address], [place],  [address]     Radiology Studies: DG Chest 2 View  Result Date: 06/23/2022 CLINICAL DATA:  Chest pain and shortness of breath  EXAM: CHEST - 2 VIEW COMPARISON:  02/03/2022 FINDINGS: Cardiac shadow is within normal limits. Postsurgical changes are again seen and stable. Lungs are well aerated bilaterally. No focal infiltrate or effusion is noted. Previously seen nipple shadows are less well appreciated on today's exam. No bony abnormality is noted. IMPRESSION: No acute abnormality noted. Electronically Signed   By: Inez Catalina M.D.   On: 06/23/2022 18:47     Scheduled Meds:  aspirin EC  81 mg Oral Daily   Chlorhexidine Gluconate Cloth  6 each Topical Q0600  gabapentin  300 mg Oral BID AC   gabapentin  600 mg Oral QHS   metoprolol succinate  12.5 mg Oral Daily   mometasone-formoterol  2 puff Inhalation BID   pantoprazole  40 mg Oral Daily   predniSONE  20 mg Oral Q breakfast   propafenone  225 mg Oral Q8H   sertraline  150 mg Oral Daily   sodium chloride flush  3 mL Intravenous Q12H   umeclidinium bromide  1 puff Inhalation Daily   Continuous Infusions:   LOS: 0 days    Roxan Hockey M.D on 06/24/2022 at 2:08 PM  Go to [email or web address] - for contact info  [facility] - Office  [phone]  If 7PM-7AM, please contact night-coverage [email or web address] 06/24/2022, 2:08 PM

## 2022-06-25 ENCOUNTER — Inpatient Hospital Stay (HOSPITAL_COMMUNITY): Payer: Medicaid Other | Admitting: Anesthesiology

## 2022-06-25 ENCOUNTER — Encounter (HOSPITAL_COMMUNITY): Payer: Self-pay | Admitting: Family Medicine

## 2022-06-25 ENCOUNTER — Encounter (HOSPITAL_COMMUNITY)
Admission: EM | Disposition: A | Discharge status: Home / Self Care | Payer: Self-pay | Source: Ambulatory Visit | Attending: Family Medicine

## 2022-06-25 ENCOUNTER — Ambulatory Visit: Payer: Medicaid Other | Admitting: Internal Medicine

## 2022-06-25 ENCOUNTER — Ambulatory Visit: Payer: Medicaid Other | Admitting: Nurse Practitioner

## 2022-06-25 DIAGNOSIS — I5032 Chronic diastolic (congestive) heart failure: Secondary | ICD-10-CM

## 2022-06-25 DIAGNOSIS — Z7901 Long term (current) use of anticoagulants: Secondary | ICD-10-CM | POA: Diagnosis not present

## 2022-06-25 DIAGNOSIS — Z952 Presence of prosthetic heart valve: Secondary | ICD-10-CM | POA: Diagnosis not present

## 2022-06-25 DIAGNOSIS — I4891 Unspecified atrial fibrillation: Secondary | ICD-10-CM | POA: Diagnosis not present

## 2022-06-25 DIAGNOSIS — J449 Chronic obstructive pulmonary disease, unspecified: Secondary | ICD-10-CM

## 2022-06-25 DIAGNOSIS — I10 Essential (primary) hypertension: Secondary | ICD-10-CM

## 2022-06-25 DIAGNOSIS — R599 Enlarged lymph nodes, unspecified: Secondary | ICD-10-CM | POA: Diagnosis not present

## 2022-06-25 DIAGNOSIS — Q251 Coarctation of aorta: Secondary | ICD-10-CM | POA: Diagnosis not present

## 2022-06-25 DIAGNOSIS — R590 Localized enlarged lymph nodes: Secondary | ICD-10-CM

## 2022-06-25 HISTORY — PX: LYMPH NODE BIOPSY: SHX201

## 2022-06-25 LAB — CBC
HCT: 30.2 % — ABNORMAL LOW (ref 36.0–46.0)
Hemoglobin: 9.2 g/dL — ABNORMAL LOW (ref 12.0–15.0)
MCH: 26.8 pg (ref 26.0–34.0)
MCHC: 30.5 g/dL (ref 30.0–36.0)
MCV: 88 fL (ref 80.0–100.0)
Platelets: 262 10*3/uL (ref 150–400)
RBC: 3.43 MIL/uL — ABNORMAL LOW (ref 3.87–5.11)
RDW: 16.1 % — ABNORMAL HIGH (ref 11.5–15.5)
WBC: 11.4 10*3/uL — ABNORMAL HIGH (ref 4.0–10.5)
nRBC: 0 % (ref 0.0–0.2)

## 2022-06-25 LAB — BASIC METABOLIC PANEL
Anion gap: 7 (ref 5–15)
BUN: 16 mg/dL (ref 6–20)
CO2: 25 mmol/L (ref 22–32)
Calcium: 8.3 mg/dL — ABNORMAL LOW (ref 8.9–10.3)
Chloride: 107 mmol/L (ref 98–111)
Creatinine, Ser: 0.91 mg/dL (ref 0.44–1.00)
GFR, Estimated: >60 mL/min (ref >60)
Glucose, Bld: 91 mg/dL (ref 70–99)
Potassium: 3.6 mmol/L (ref 3.5–5.1)
Sodium: 139 mmol/L (ref 135–145)

## 2022-06-25 LAB — HEPARIN LEVEL (UNFRACTIONATED): Heparin Unfractionated: 0.19 IU/mL — ABNORMAL LOW (ref 0.30–0.70)

## 2022-06-25 SURGERY — LYMPH NODE BIOPSY
Anesthesia: Monitor Anesthesia Care | Site: Groin | Laterality: Right

## 2022-06-25 MED ORDER — FENTANYL CITRATE PF 50 MCG/ML IJ SOSY: 25.0000 ug | PREFILLED_SYRINGE | INTRAMUSCULAR | Status: DC | PRN

## 2022-06-25 MED ORDER — MIDAZOLAM HCL 2 MG/2ML IJ SOLN
INTRAMUSCULAR | Status: AC
  Filled 2022-06-25: qty 2

## 2022-06-25 MED ORDER — LIDOCAINE HCL (PF) 1 % IJ SOLN
INTRAMUSCULAR | Status: AC
  Filled 2022-06-25: qty 30

## 2022-06-25 MED ORDER — LORAZEPAM 2 MG/ML IJ SOLN
1.0000 mg | Freq: Once | INTRAMUSCULAR | Status: AC
  Administered 2022-06-25: 1 mg via INTRAVENOUS
  Filled 2022-06-25: qty 1

## 2022-06-25 MED ORDER — WARFARIN SODIUM 2.5 MG PO TABS
5.0000 mg | ORAL_TABLET | Freq: Once | ORAL | Status: AC
  Administered 2022-06-25: 5 mg via ORAL
  Filled 2022-06-25: qty 2

## 2022-06-25 MED ORDER — WARFARIN - PHARMACIST DOSING INPATIENT: Freq: Every day | Status: DC

## 2022-06-25 MED ORDER — PROPOFOL 10 MG/ML IV BOLUS
INTRAVENOUS | Status: DC | PRN
  Administered 2022-06-25: 40 mg via INTRAVENOUS

## 2022-06-25 MED ORDER — AMLODIPINE BESYLATE 5 MG PO TABS
5.0000 mg | ORAL_TABLET | Freq: Every day | ORAL | Status: DC
  Administered 2022-06-25 – 2022-06-27 (×3): 5 mg via ORAL
  Filled 2022-06-25 (×3): qty 1

## 2022-06-25 MED ORDER — HEPARIN (PORCINE) 25000 UT/250ML-% IV SOLN
1150.0000 [IU]/h | INTRAVENOUS | Status: DC
  Administered 2022-06-25: 900 [IU]/h via INTRAVENOUS
  Administered 2022-06-26: 1050 [IU]/h via INTRAVENOUS
  Filled 2022-06-25 (×2): qty 250

## 2022-06-25 MED ORDER — 0.9 % SODIUM CHLORIDE (POUR BTL) OPTIME
TOPICAL | Status: DC | PRN
  Administered 2022-06-25: 1000 mL

## 2022-06-25 MED ORDER — ORAL CARE MOUTH RINSE: 15.0000 mL | Freq: Once | OROMUCOSAL | Status: AC

## 2022-06-25 MED ORDER — FENTANYL CITRATE (PF) 100 MCG/2ML IJ SOLN
INTRAMUSCULAR | Status: DC | PRN
  Administered 2022-06-25 (×2): 25 ug via INTRAVENOUS

## 2022-06-25 MED ORDER — PROPOFOL 10 MG/ML IV BOLUS
INTRAVENOUS | Status: AC
  Filled 2022-06-25: qty 20

## 2022-06-25 MED ORDER — ONDANSETRON HCL 4 MG/2ML IJ SOLN: 4.0000 mg | Freq: Once | INTRAMUSCULAR | Status: DC | PRN

## 2022-06-25 MED ORDER — LACTATED RINGERS IV SOLN: INTRAVENOUS | Status: DC

## 2022-06-25 MED ORDER — MIDAZOLAM HCL 2 MG/2ML IJ SOLN
INTRAMUSCULAR | Status: DC | PRN
  Administered 2022-06-25 (×3): 1 mg via INTRAVENOUS

## 2022-06-25 MED ORDER — FENTANYL CITRATE (PF) 100 MCG/2ML IJ SOLN
INTRAMUSCULAR | Status: AC
  Filled 2022-06-25: qty 2

## 2022-06-25 MED ORDER — LIDOCAINE HCL (PF) 1 % IJ SOLN
INTRAMUSCULAR | Status: DC | PRN
  Administered 2022-06-25: 4.5 mL

## 2022-06-25 MED ORDER — CHLORHEXIDINE GLUCONATE 0.12 % MT SOLN
15.0000 mL | Freq: Once | OROMUCOSAL | Status: AC
  Administered 2022-06-25: 15 mL via OROMUCOSAL
  Filled 2022-06-25: qty 15

## 2022-06-25 MED ORDER — LACTATED RINGERS IV SOLN: INTRAVENOUS | Status: DC | PRN

## 2022-06-25 SURGICAL SUPPLY — 35 items
ADH SKN CLS APL DERMABOND .7 (GAUZE/BANDAGES/DRESSINGS) ×1
APPLIER CLIP 9.375 SM OPEN (CLIP)
APR CLP SM 9.3 20 MLT OPN (CLIP)
CLIP APPLIE 9.375 SM OPEN (CLIP) IMPLANT
CLOTH BEACON ORANGE TIMEOUT ST (SAFETY) ×1 IMPLANT
COVER LIGHT HANDLE STERIS (MISCELLANEOUS) ×2 IMPLANT
DECANTER SPIKE VIAL GLASS SM (MISCELLANEOUS) ×1 IMPLANT
DERMABOND ADVANCED .7 DNX12 (GAUZE/BANDAGES/DRESSINGS) IMPLANT
ELECT NDL TIP 2.8 STRL (NEEDLE) ×1 IMPLANT
ELECT NEEDLE TIP 2.8 STRL (NEEDLE) ×1 IMPLANT
ELECT REM PT RETURN 9FT ADLT (ELECTROSURGICAL) ×1
ELECTRODE REM PT RTRN 9FT ADLT (ELECTROSURGICAL) ×1 IMPLANT
GLOVE BIO SURGEON STRL SZ 6.5 (GLOVE) ×1 IMPLANT
GLOVE BIOGEL PI IND STRL 6.5 (GLOVE) ×1 IMPLANT
GLOVE BIOGEL PI IND STRL 7.0 (GLOVE) ×2 IMPLANT
GOWN STRL REUS W/TWL LRG LVL3 (GOWN DISPOSABLE) ×2 IMPLANT
KIT CLEAN CATCH URINE (SET/KITS/TRAYS/PACK) ×1 IMPLANT
KIT TURNOVER KIT A (KITS) ×1 IMPLANT
LIGASURE VESSEL 5MM BLUNT TIP (ELECTROSURGICAL) IMPLANT
MANIFOLD NEPTUNE II (INSTRUMENTS) ×1 IMPLANT
NDL HYPO 25X1 1.5 SAFETY (NEEDLE) ×1 IMPLANT
NEEDLE HYPO 25X1 1.5 SAFETY (NEEDLE) ×1 IMPLANT
NS IRRIG 1000ML POUR BTL (IV SOLUTION) ×1 IMPLANT
PACK MINOR (CUSTOM PROCEDURE TRAY) ×1 IMPLANT
PAD ARMBOARD 7.5X6 YLW CONV (MISCELLANEOUS) ×1 IMPLANT
PAD TELFA 3X4 1S STER (GAUZE/BANDAGES/DRESSINGS) IMPLANT
SET BASIN LINEN APH (SET/KITS/TRAYS/PACK) ×1 IMPLANT
SHEARS HARMONIC 9CM CVD (BLADE) IMPLANT
SPONGE INTESTINAL PEANUT (DISPOSABLE) IMPLANT
SUT MNCRL AB 4-0 PS2 18 (SUTURE) ×1 IMPLANT
SUT SILK 2 0 (SUTURE)
SUT SILK 2-0 18XBRD TIE 12 (SUTURE) IMPLANT
SUT VIC AB 3-0 SH 27 (SUTURE) ×1
SUT VIC AB 3-0 SH 27X BRD (SUTURE) ×1 IMPLANT
SYR CONTROL 10ML LL (SYRINGE) ×1 IMPLANT

## 2022-06-25 NOTE — Progress Notes (Signed)
PROGRESS NOTE     Mallory Sutton, is a 62 y.o. female, DOB - 21-Aug-1960, [id number]  Admit date - 06/23/2022   Admitting Physician Francisca Harbuck Denton Brick, MD  Outpatient Primary MD for the patient is Valentino Nose, FNP  LOS - 1  Chief Complaint  Patient presents with   Atrial Fibrillation      Brief Narrative:  62 y.o. 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 age 54, aortic valve and arch repair in 2016, atrial fibrillation on warfarin and status post MAZE and left atrial appendage occlusion, anxiety, COPD, and chronic hypoxic respiratory failure admitted on 06/23/22 with A-fib with RVR status post cardioversion on 06/23/2022     -Assessment and Plan: 1)Persistent Afib with RVR--status post cardioversion on 06/24/2022 -Paroxysmal atrial fibrillation s/p DCCV in 2021 and 06/24/2022, s/p maze procedure and LAAO surgical occlusion in 2016  -Per cardiologist okay to continue metoprolol and propafenone at this time -Patient does Not want to use amiodarone -Patient had surgical occlusion of left atrial appendage in 2016. TEE in 2021 showed residual left atrial appendage with velocities 20 cm/s. Continue Coumadin with INR goal between 2 and 3  06/25/22 -Continue heparin bridge  (had Lymph Node biopsy on 06/25/22) especially given recent cardioversion on 06/23/2022 -Give Coumadin 5 mg tablet x 1 -Recheck PT/INR in a.m. -Potassium replaced and normalized,  magnesium WNL -Cardiology recommends outpatient follow-up with EP outpatient to switch propafenone to Tikosyn.   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 The pressure gradient across the descending aorta was 47 mmHg.  4) possible lymphoma--- lymphadenopathy noted previously evaluated by oncologist Dr. Delton Coombes  -s/p Lymph Node biopsy by general surgeon Dr. Constance Haw on 06/25/22 -Pathology pending - 5) anxiety disorder--- continue benzos and Zoloft  6) chronic hypoxic respiratory failure secondary to COPD-- -appears stable -Oxygen at 2 L via nasal cannula -Continue bronchodilators  Status is: Inpatient   Disposition: The patient is from: Home              Anticipated d/c is to: Home              Anticipated d/c date is: 1 day              Patient currently is not medically stable to d/c. Barriers: Not Clinically Stable-   Code Status :  -  Code Status: Full Code   Family Communication:    NA (patient is alert, awake and coherent)   DVT Prophylaxis  :   - SCDs /Coumadin/Heparin  warfarin (COUMADIN) tablet 5 mg   Lab Results  Component Value Date   PLT 262 06/25/2022    Inpatient Medications  Scheduled Meds:  amLODipine  5 mg Oral Daily   aspirin EC  81 mg Oral Daily   Chlorhexidine Gluconate Cloth  6 each Topical Q0600   gabapentin  300 mg Oral BID AC   gabapentin  600 mg Oral QHS   metoprolol succinate  12.5 mg Oral Daily   mometasone-formoterol  2 puff Inhalation BID   pantoprazole  40 mg Oral Daily  predniSONE  20 mg Oral Q breakfast   propafenone  225 mg Oral Q8H   sertraline  150 mg Oral Daily   sodium chloride flush  3 mL Intravenous Q12H   umeclidinium bromide  1 puff Inhalation Daily   warfarin  5 mg Oral ONCE-1600   Continuous Infusions:  heparin 900 Units/hr (06/25/22 1515)   lactated ringers 50 mL/hr at 06/25/22 1515   PRN Meds:.acetaminophen **OR** acetaminophen, albuterol, diazepam, fentaNYL (SUBLIMAZE) injection, HYDROcodone-acetaminophen, nitroGLYCERIN, polyethylene glycol   Anti-infectives (From admission, onward)     Start     Dose/Rate Route Frequency Ordered Stop   06/25/22 0600  vancomycin (VANCOCIN) IVPB 1000 mg/200 mL premix        1,000 mg 200 mL/hr over 60 Minutes Intravenous On call to O.R. 06/24/22 1639 06/25/22 0756       Subjective: Laureen Ochs today has no fevers, no emesis,  No chest pain,   - -Tolerated lymph node biopsy/sedation well -No bleeding concerns  Objective: Vitals:   06/25/22 0830 06/25/22 0832 06/25/22 0938 06/25/22 1000  BP:  (!) 143/71 (!) 167/78 (!) 151/91  Pulse: 63 67 91 81  Resp: 12 13  16  $ Temp:    98 F (36.7 C)  TempSrc:    Oral  SpO2: 100% 100%  100%  Weight:      Height:        Intake/Output Summary (Last 24 hours) at 06/25/2022 1547 Last data filed at 06/25/2022 1515 Gross per 24 hour  Intake 1062.77 ml  Output 0 ml  Net 1062.77 ml   Filed Weights   06/24/22 0300 06/25/22 0500 06/25/22 0639  Weight: 48.3 kg 49.6 kg 49.6 kg   Physical Exam Gen:- Awake Alert, in no acute distress HEENT:- [place].AT, No sclera icterus Nose- [place] 2L/min Neck-Supple Neck,No JVD,.  Lungs-  fair symmetrical air movement, no wheezing CV- S1, S2 normal, regular , prior Sternotomy Abd-  +ve B.Sounds, Abd Soft, No tenderness,    Extremity/Skin:- No  edema, pedal pulses present  Psych-affect is appropriate, oriented x3 Neuro-no new focal deficits, no tremors MSK-right groin biopsy site is hemostatic ??  Right inguinal hernia-reducible  Data Reviewed: I have personally reviewed following labs and imaging studies  CBC: Recent Labs  Lab 06/23/22 1417 06/23/22 1900 06/24/22 0532 06/25/22 0449  WBC 21.8* 19.5* 19.8* 11.4*  NEUTROABS 17.1*  --   --   --   HGB 12.4 12.1 9.6* 9.2*  HCT 37.9 38.5 31.1* 30.2*  MCV 81.2 83.2 85.7 88.0  PLT 446* 466* 312 [id number]   Basic Metabolic Panel: Recent Labs  Lab 06/23/22 1417 06/23/22 1900 06/24/22 0532 06/25/22 0449  NA 137 137 139 139  K 3.2* 2.8* 4.1 3.6  CL 98 96* 106 107  CO2 26 27 26 25  $ GLUCOSE 113* 105* 103* 91   BUN 19 21* 19 16  CREATININE 1.04* 1.03* 0.82 0.91  CALCIUM 9.7 9.3 7.9* 8.3*  MG  --  2.4 2.2  --    GFR: Estimated Creatinine Clearance: 51.5 mL/min (by C-G formula based on SCr of 0.91 mg/dL). Liver Function Tests: Recent Labs  Lab 06/23/22 1417 06/23/22 1900  AST 38 35  ALT 28 27  ALKPHOS 89 86  BILITOT 0.6 0.8  PROT 7.4 7.2  ALBUMIN 4.2 4.1   Recent Results (from the past 240 hour(s))  Resp panel by RT-PCR (RSV, Flu A&B, Covid) Anterior Nasal Swab     Status: None   Collection Time: 06/23/22  7:25 PM  Specimen: Anterior Nasal Swab  Result Value Ref Range Status   SARS Coronavirus 2 by RT PCR NEGATIVE NEGATIVE Final    Comment: (NOTE) SARS-CoV-2 target nucleic acids are NOT DETECTED.  The SARS-CoV-2 RNA is generally detectable in upper respiratory specimens during the acute phase of infection. The lowest concentration of SARS-CoV-2 viral copies this assay can detect is 138 copies/mL. A negative result does not preclude SARS-Cov-2 infection and should not be used as the sole basis for treatment or other patient management decisions. A negative result may occur with  improper specimen collection/handling, submission of specimen other than nasopharyngeal swab, presence of viral mutation(s) within the areas targeted by this assay, and inadequate number of viral copies(<138 copies/mL). A negative result must be combined with clinical observations, patient history, and epidemiological information. The expected result is Negative.  Fact Sheet for Patients:  EntrepreneurPulse.com.au  Fact Sheet for Healthcare Providers:  IncredibleEmployment.be  This test is no t yet approved or cleared by the [place] FDA and  has been authorized for detection and/or diagnosis of SARS-CoV-2 by FDA under an Emergency Use Authorization (EUA). This EUA will remain  in effect (meaning this test can be used) for the duration of the COVID-19  declaration under Section 564(b)(1) of the Act, 21 U.S.C.section 360bbb-3(b)(1), unless the authorization is terminated  or revoked sooner.       Influenza A by PCR NEGATIVE NEGATIVE Final   Influenza B by PCR NEGATIVE NEGATIVE Final    Comment: (NOTE) The Xpert Xpress SARS-CoV-2/FLU/RSV plus assay is intended as an aid in the diagnosis of influenza from Nasopharyngeal swab specimens and should not be used as a sole basis for treatment. Nasal washings and aspirates are unacceptable for Xpert Xpress SARS-CoV-2/FLU/RSV testing.  Fact Sheet for Patients: EntrepreneurPulse.com.au  Fact Sheet for Healthcare Providers: IncredibleEmployment.be  This test is not yet approved or cleared by the [place] FDA and has been authorized for detection and/or diagnosis of SARS-CoV-2 by FDA under an Emergency Use Authorization (EUA). This EUA will remain in effect (meaning this test can be used) for the duration of the COVID-19 declaration under Section 564(b)(1) of the Act, 21 U.S.C. section 360bbb-3(b)(1), unless the authorization is terminated or revoked.     Resp Syncytial Virus by PCR NEGATIVE NEGATIVE Final    Comment: (NOTE) Fact Sheet for Patients: EntrepreneurPulse.com.au  Fact Sheet for Healthcare Providers: IncredibleEmployment.be  This test is not yet approved or cleared by the [place] FDA and has been authorized for detection and/or diagnosis of SARS-CoV-2 by FDA under an Emergency Use Authorization (EUA). This EUA will remain in effect (meaning this test can be used) for the duration of the COVID-19 declaration under Section 564(b)(1) of the Act, 21 U.S.C. section 360bbb-3(b)(1), unless the authorization is terminated or revoked.  Performed at [facility], [address] [place], [place] [address]   MRSA Next Gen by PCR, Nasal     Status: None   Collection Time: 06/24/22  1:43 AM   Specimen:  Nasal Mucosa; Nasal Swab  Result Value Ref Range Status   MRSA by PCR Next Gen NOT DETECTED NOT DETECTED Final    Comment: (NOTE) The GeneXpert MRSA Assay (FDA approved for NASAL specimens only), is one component of a comprehensive MRSA colonization surveillance program. It is not intended to diagnose MRSA infection nor to guide or monitor treatment for MRSA infections. Test performance is not FDA approved in patients less than 15 years old. Performed at [facility], [address],  [place], [place] [address]     Radiology Studies: DG Chest 2 View  Result Date: 06/23/2022 CLINICAL DATA:  Chest pain and shortness of breath EXAM: CHEST - 2 VIEW COMPARISON:  02/03/2022 FINDINGS: Cardiac shadow is within normal limits. Postsurgical changes are again seen and stable. Lungs are well aerated bilaterally. No focal infiltrate or effusion is noted. Previously seen nipple shadows are less well appreciated on today's exam. No bony abnormality is noted. IMPRESSION: No acute abnormality noted. Electronically Signed   By: Inez Catalina M.D.   On: 06/23/2022 18:47    Scheduled Meds:  amLODipine  5 mg Oral Daily   aspirin EC  81 mg Oral Daily   Chlorhexidine Gluconate Cloth  6 each Topical Q0600   gabapentin  300 mg Oral BID AC   gabapentin  600 mg Oral QHS   metoprolol succinate  12.5 mg Oral Daily   mometasone-formoterol  2 puff Inhalation BID   pantoprazole  40 mg Oral Daily   predniSONE  20 mg Oral Q breakfast   propafenone  225 mg Oral Q8H   sertraline  150 mg Oral Daily   sodium chloride flush  3 mL Intravenous Q12H   umeclidinium bromide  1 puff Inhalation Daily   warfarin  5 mg Oral ONCE-1600   Continuous Infusions:  heparin 900 Units/hr (06/25/22 1515)   lactated ringers 50 mL/hr at 06/25/22 1515    LOS: 1 day   Roxan Hockey M.D on 06/25/2022 at 3:47 PM  Go to [email or web address] - for contact info  [facility] - Office  [phone]  If 7PM-7AM, please contact  night-coverage [email or web address] 06/25/2022, 3:47 PM

## 2022-06-25 NOTE — Progress Notes (Addendum)
ANTICOAGULATION CONSULT NOTE - Follow Up Consult  Pharmacy Consult for heparin Indication:  atrial fibrillation/aortic valve replacement  Allergies  Allergen Reactions   Amiodarone Nausea Only   Penicillins Hives    Has patient had a PCN reaction causing immediate rash, facial/tongue/throat swelling, SOB or lightheadedness with hypotension: YES Has patient had a PCN reaction causing severe rash involving mucus membranes or skin necrosis: NO Has patient had a PCN reaction that required hospitalization NO Has patient had a PCN reaction occurring within the last 10 years: NO If all of the above answers are "NO", then may proceed with Cephalosporin use.     Patient Measurements: Height: 5' 4"$  (162.6 cm) Weight: 49.6 kg (109 lb 5.6 oz) IBW/kg (Calculated) : 54.7 Heparin Dosing Weight: 49.6 kg  Vital Signs: Temp: 98 F (36.7 C) (02/22 0816) Temp Source: Oral (02/22 0639) BP: 167/78 (02/22 0938) Pulse Rate: 91 (02/22 0938)  Labs: Recent Labs    06/23/22 1900 06/23/22 2030 06/24/22 0532 06/24/22 1408 06/24/22 2317 06/25/22 0449  HGB 12.1  --  9.6*  --   --  9.2*  HCT 38.5  --  31.1*  --   --  30.2*  PLT 466*  --  312  --   --  262  LABPROT 22.6*  --  20.7* 20.3*  --   --   INR 2.0*  --  1.8* 1.8*  --   --   HEPARINUNFRC  --   --   --   --  <0.10*  --   CREATININE 1.03*  --  0.82  --   --  0.91  TROPONINIHS 14 11  --   --   --   --     Estimated Creatinine Clearance: 51.5 mL/min (by C-G formula based on SCr of 0.91 mg/dL).  Assessment: 61 yo female being restarted on heparin after holding for biopsy. Heparin level prior to procedure subtherapeutic at <0.10. To restart at 1200 today with no bolus per surgeon. Platelets WNL at 262 today. Hemoglobin 9.2 today and trending down from 12.4 on 2/20.  Goal of Therapy:  Heparin level 0.3-0.7 units/ml Monitor platelets by anticoagulation protocol: Yes   Plan:  Restart IV heparin infusion at an increased rate of 900 units/hr.  Recheck heparin level in 8 hours and daily. Continue to monitor CBC daily.  MD ordered warfarin 5 mg x 1 dose. F/U INR in AM and discontinue heparin if INR > 2.0  Charlett Lango 06/25/2022,10:12 AM

## 2022-06-25 NOTE — Transfer of Care (Signed)
Immediate Anesthesia Transfer of Care Note  Patient: Mallory Sutton  Procedure(s) Performed: LYMPH NODE BIOPSY; right groin (Right: Groin)  Patient Location: PACU  Anesthesia Type:MAC  Level of Consciousness: awake, alert , and oriented  Airway & Oxygen Therapy: Patient Spontanous Breathing and Patient connected to nasal cannula oxygen  Post-op Assessment: Report given to RN and Post -op Vital signs reviewed and stable  Post vital signs: Reviewed and stable  Last Vitals:  Vitals Value Taken Time  BP 158/78 06/25/22 0816  Temp 36.7 C 06/25/22 0816  Pulse 68 06/25/22 0816  Resp 16 06/25/22 0816  SpO2 100 % 06/25/22 0816    Last Pain:  Vitals:   06/25/22 0642  TempSrc:   PainSc: 6          Complications: No notable events documented.

## 2022-06-25 NOTE — Progress Notes (Signed)
Pt Heparin off at midnight, NPO since midnight, sip of H2O with Rythmol at 0630, Vanco given to pre-op nurse, pt left for pre-op holding.

## 2022-06-25 NOTE — Progress Notes (Addendum)
[facility]  Lymph node biopsy completed, 2cm node removed from right inguinal region. No issues. Restart heparin without bolus 12 noon.   Talked to her daughter.  I will plan to see her 3/6 in the office to check the wound. As long as incision area has no major swelling or bruising tomorrow can restart her coumadin.  Dr. Arnoldo Morale will be available if needed to look at patient.   Curlene Labrum, MD [facility] [address] [place], [place] [address] [phone] (office)

## 2022-06-25 NOTE — Interval H&P Note (Signed)
History and Physical Interval Note:  06/25/2022 7:22 AM  Mallory Sutton  has presented today for surgery, with the diagnosis of adenopathy.  The various methods of treatment have been discussed with the patient and family. After consideration of risks, benefits and other options for treatment, the patient has consented to  Procedure(s): LYMPH NODE BIOPSY; right groin (Right) as a surgical intervention.  The patient's history has been reviewed, patient examined, no change in status, stable for surgery.  I have reviewed the patient's chart and labs.  Questions were answered to the patient's satisfaction.    Admitted to hospital for cardioversion. Needs to stay on anticoagulation and off limited amount of time with the cardioversion. Will plan for biopsy while inpatient. Discusses risk of bleeding, infection, healing issues from prednisone, chyle leak.  Virl Cagey

## 2022-06-25 NOTE — Progress Notes (Signed)
ANTICOAGULATION CONSULT NOTE - Follow Up Consult  Pharmacy Consult for Heparin Indication: atrial fibrillation and aortic valve replacement  (On-X)  Allergies  Allergen Reactions   Amiodarone Nausea Only   Penicillins Hives    Has patient had a PCN reaction causing immediate rash, facial/tongue/throat swelling, SOB or lightheadedness with hypotension: YES Has patient had a PCN reaction causing severe rash involving mucus membranes or skin necrosis: NO Has patient had a PCN reaction that required hospitalization NO Has patient had a PCN reaction occurring within the last 10 years: NO If all of the above answers are "NO", then may proceed with Cephalosporin use.     Patient Measurements: Height: 5' 4"$  (162.6 cm) Weight: 49.6 kg (109 lb 5.6 oz) IBW/kg (Calculated) : 54.7 Heparin Dosing Weight: 49.6 kg  Vital Signs: Temp: 98 F (36.7 C) (02/22 1000) Temp Source: Oral (02/22 1000) BP: 151/91 (02/22 1000) Pulse Rate: 81 (02/22 1000)  Labs: Recent Labs    06/23/22 1900 06/23/22 2030 06/24/22 0532 06/24/22 1408 06/24/22 2317 06/25/22 0449 06/25/22 1851  HGB 12.1  --  9.6*  --   --  9.2*  --   HCT 38.5  --  31.1*  --   --  30.2*  --   PLT 466*  --  312  --   --  262  --   LABPROT 22.6*  --  20.7* 20.3*  --   --   --   INR 2.0*  --  1.8* 1.8*  --   --   --   HEPARINUNFRC  --   --   --   --  <0.10*  --  0.19*  CREATININE 1.03*  --  0.82  --   --  0.91  --   TROPONINIHS 14 11  --   --   --   --   --     Estimated Creatinine Clearance: 51.5 mL/min (by C-G formula based on SCr of 0.91 mg/dL).  Assessment: 62 yo female on warfarin PTA for atrial fibrillation. Warfarin held 2/21 pm for biopsy today. IV heparin resumed without bolus at ~12n post-procedure. Heparin level prior to procedure subtherapeutic at <0.10 on 700 units/hr. Hemoglobin 9.2 today trended down from 12.4 on 2/20, platelets 262. No bleeding reported.  Heparin level tonight is subtherapeutic (0.19) on 900  units/hr. Warfarin 5 mg x 1 given tonight per MD order.  INR 1.8 on 2/21. PTA warfarin regimen: 3 mg daily except 4 mg on Fridays.  Goal of Therapy:  Heparin level 0.3-0.7 units/ml Monitor platelets by anticoagulation protocol: Yes   Plan:  Increase heparin drip to 1050 units/hr. Next heparin level and CBC in am. Daily PT/INR. Stop heparin when INR at goal 2-3. Follow up for further warfarin orders, or Pharmacy consult to continue dosing.  Arty Baumgartner, [place] 06/25/2022,8:02 PM

## 2022-06-25 NOTE — H&P (Signed)
[facility] History and Physical   Reason for Referral: Adenopathy Referring Physician: Dr. Erin Fulling, MD Pulmonologist    Chief Complaint   Enlarged lymph nodes        Mallory Sutton is a 62 y.o. female.  HPI: Mallory Sutton is a 61 yo who I had seen in the past for a cyst on her leg but comes in today for adenopathy found by her pulmonologist.  She has a history of CHF with coarctation of the aorta, A fib, a heart murmur, vasculitis and presumed COPD that requires some oxygen. She had a PET scan that demonstrates some adenopathy and low attenuation in several node regions including her groin. She is very thin and can feel this nodes easily.    She she rheumatology for her vasculitis that  started in 2022 and sees pulmonary for her COPD. She has been on steroids for her COPD.  She sees Dr. [name] and Dr. Lovena Le for her cardiology team, and says Dr. [name] is aware of her needing to a lymph node biopsy and her coming off her coumadin prior to a procedure.    She is currently in A fib and her rate is not controlled. She has an appt with Cardiology after me.        Past Medical History:  Diagnosis Date   Aortic stenosis     ATRIAL ARRHYTHMIAS     Bicuspid aortic valve     CHF (congestive heart failure) (HCC)     COARCTATION OF AORTA     ENDOMETRIOSIS     Heart murmur     Paroxysmal atrial fibrillation (HCC)     Shortness of breath     THORACIC AORTIC ANEURYSM     Vasculitis ([place])             Past Surgical History:  Procedure Laterality Date   CARDIOVERSION N/A 08/29/2019    Procedure: CARDIOVERSION;  Surgeon: Arnoldo Lenis, MD;  Location: [facility];  Service: Endoscopy;  Laterality: N/A;   coarctation repair and residual restenosis       KNEE SURGERY       TEE WITHOUT CARDIOVERSION N/A 07/12/2019    Procedure: TRANSESOPHAGEAL ECHOCARDIOGRAM (TEE);  Surgeon: Sueanne Margarita, MD;  Location: [facility];  Service: Cardiovascular;  Laterality: N/A;   TEE WITHOUT  CARDIOVERSION N/A 08/29/2019    Procedure: TRANSESOPHAGEAL ECHOCARDIOGRAM (TEE) WITH PROPOFOL;  Surgeon: Arnoldo Lenis, MD;  Location: [facility];  Service: Endoscopy;  Laterality: N/A;           Family History  Problem Relation Age of Onset   Suicidality Mother     Mental illness Mother     Drug abuse Brother     Healthy Daughter     Healthy Son        Social History         Tobacco Use   Smoking status: Never      Passive exposure: Past   Smokeless tobacco: Never  Vaping Use   Vaping Use: Never used  Substance Use Topics   Alcohol use: Yes      Alcohol/week: 0.0 standard drinks of alcohol      Comment: 3 beers 3 nights a week   Drug use: No      Medications: I have reviewed the patient's current medications. Allergies as of 06/23/2022         Reactions    Amiodarone Nausea Only    Penicillins Hives    Has patient  had a PCN reaction causing immediate rash, facial/tongue/throat swelling, SOB or lightheadedness with hypotension: YES Has patient had a PCN reaction causing severe rash involving mucus membranes or skin necrosis: NO Has patient had a PCN reaction that required hospitalization NO Has patient had a PCN reaction occurring within the last 10 years: NO If all of the above answers are "NO", then may proceed with Cephalosporin use.            Medication List           Accurate as of June 23, 2022 10:58 AM. If you have any questions, ask your nurse or doctor.              albuterol 108 (90 Base) MCG/ACT inhaler Commonly known as: VENTOLIN HFA Inhale 2 puffs into the lungs every 6 (six) hours as needed for wheezing or shortness of breath.    aspirin EC 81 MG tablet Take 81 mg by mouth daily.    Breztri Aerosphere 160-9-4.8 MCG/ACT Aero Generic drug: Budeson-Glycopyrrol-Formoterol Inhale into the lungs.    calcium-vitamin D 500-5 MG-MCG tablet Commonly known as: OSCAL WITH D Take 1 tablet by mouth daily.    diazepam 10 MG tablet Commonly  known as: VALIUM Take 10 mg by mouth daily as needed for sleep.    feeding supplement Liqd Take 237 mLs by mouth 2 (two) times daily between meals.    furosemide 40 MG tablet Commonly known as: LASIX Take 80 mg by mouth daily as needed for fluid or edema.    gabapentin 300 MG capsule Commonly known as: NEURONTIN Take 300-600 mg by mouth See admin instructions. Take 300 mg in the morning, 300 mg in the afternoon, and 600 mg at bedtime    HYDROcodone-acetaminophen 5-325 MG tablet Commonly known as: NORCO/VICODIN Take 2 tablets by mouth 2 (two) times daily.    ipratropium-albuterol 0.5-2.5 (3) MG/3ML Soln Commonly known as: DUONEB SMARTSIG:3 Milliliter(s) Via Nebulizer Twice Daily PRN    magnesium oxide 400 (240 Mg) MG tablet Commonly known as: MagOx 400 Take 1 tablet (400 mg total) by mouth daily.    metoprolol succinate 25 MG 24 hr tablet Commonly known as: TOPROL-XL TAKE 1 TABLET BY MOUTH ONCE DAILY. What changed: how much to take    Multi-Vitamins Tabs Take 1 tablet by mouth daily.    ondansetron 4 MG disintegrating tablet Commonly known as: Zofran ODT Take 1 tablet (4 mg total) by mouth every 8 (eight) hours as needed for nausea or vomiting.    potassium chloride SA 20 MEQ tablet Commonly known as: KLOR-CON M Take 1 tablet (20 mEq total) by mouth 2 (two) times daily for 7 days, THEN 1 tablet (20 mEq total) daily for 7 days. Take 3 tablets daily when taking lasix. Start taking on: April 30, 2022    predniSONE 20 MG tablet Commonly known as: DELTASONE Take 1 tablet (20 mg total) by mouth daily with breakfast.    propafenone 225 MG 12 hr capsule Commonly known as: Rythmol SR Take 1 capsule (225 mg total) by mouth every 8 (eight) hours.    sertraline 100 MG tablet Commonly known as: ZOLOFT Take 150 mg by mouth daily.    Turmeric 500 MG Caps Take 500 mg by mouth 3 (three) times a week.    warfarin 1 MG tablet Commonly known as: COUMADIN Take as directed by  the anticoagulation clinic. If you are unsure how to take this medication, talk to your nurse or doctor. Original instructions: Take  3 tablets daily except 2 tablets on Sundays and Wednesdays               ROS:  A comprehensive review of systems was negative except for: Respiratory: positive for cough, wheezing, and SOB on O2 Cardiovascular: positive for A fib, Chest pain at times Integument/breast: positive for vasculitis with skin changes in lower legs Hematologic/lymphatic: positive for lymphadenopathy Musculoskeletal: positive for joint pain   Blood pressure (!) 138/94, pulse (!) 130, temperature 97.6 F (36.4 C), temperature source Oral, resp. rate 18, height 5' 4"$  (1.626 m), weight 105 lb (47.6 kg), last menstrual period 07/16/2013, SpO2 100 %. Physical Exam Constitutional:      Appearance: She is underweight.  HENT:     Head: Normocephalic.     Nose: Nose normal.  Eyes:     Extraocular Movements: Extraocular movements intact.  Cardiovascular:     Rate and Rhythm: Normal rate.     Heart sounds: Murmur heard.  Pulmonary:     Effort: Pulmonary effort is normal.     Breath sounds: No wheezing.  Abdominal:     General: There is no distension.     Palpations: Abdomen is soft.  Musculoskeletal:        General: No swelling.  Lymphadenopathy:     Lower Body: Right inguinal adenopathy present. Left inguinal adenopathy present.  Skin:    General: Skin is warm.     Comments: Vasculitis skin changes on lower extremities   Neurological:     General: No focal deficit present.     Mental Status: She is alert and oriented to person, place, and time.  Psychiatric:        Mood and Affect: Mood normal.        Behavior: Behavior normal.        Results: Personally reviewed- lymph nodes just under the skin on the PET    CLINICAL DATA:  Initial treatment strategy for lymphadenopathy, concern for lymphoma.   EXAM: NUCLEAR MEDICINE PET SKULL BASE TO THIGH   TECHNIQUE: 5.28 mCi  F-18 FDG was injected intravenously. Full-ring PET imaging was performed from the skull base to thigh after the radiotracer. CT data was obtained and used for attenuation correction and anatomic localization.   Fasting blood glucose: 91 mg/dl   COMPARISON:  January 06/02/2022   FINDINGS: Mediastinal blood pool activity: SUV max 1.31   Liver activity: SUV max 1.89   NECK: Adenopathy at the thoracic inlet, see below.   Incidental CT findings: None.   CHEST: Bilateral thoracic inlet adenopathy. SUV on the LEFT of lymph nodes measuring up to 12 mm 2.84. (Image 63/3)   AP window lymph node at approximately 12 mm with a maximum SUV of 3.60. (Image 86/3) multiple lymph nodes in the retropectoral region bilaterally at or less than a cm. RIGHT paratracheal lymph nodes with similar appearance including a high RIGHT paratracheal lymph node on image 64/3 also displaying low level FDG uptake and a 9 mm short axis dimension.   No focal parenchymal FDG uptake in the lungs.   Incidental CT findings: Post aortic valve replacement and ascending thoracic aortic repair. Persistent LEFT SVC. Moderate cardiomegaly without effusion.   Airways are patent. Lungs are clear without signs of consolidative change.   ABDOMEN/PELVIS: Similar pattern of nodal enlargement in the abdomen and in the pelvis. Lymph nodes display similar low-to-moderate level uptake of FDG.   Most pronounced adenopathy in the LEFT retroperitoneum largest 15 mm as a discrete lymph node on  image 160/3 showing a maximum SUV of 3.89. Similar FDG uptake throughout all retroperitoneal and upper abdominal lymph nodes. Lymph nodes seen in the gastrohepatic ligament on image 145/3 measuring 11 mm.   Intra-aortocaval nodal enlargement (image 171/3 with a levin mm lymph nodes.   LEFT common iliac lymph node at 10 mm also with mild-to-moderate FDG uptake maximum SUV of 2.90.   LEFT pelvic sidewall lymph node (image 220/3 10 mm  short axis with similar FDG avidity. Lymph nodes in the bilateral groin slightly greater than or just less than a cm size with similar FDG uptake. No focal area of solid organ activity. Spleen top normal size at 12 cm with splenic FDG uptake slightly less than liver.   Incidental CT findings: Liver density up to 65 Hounsfield units. Mildly lobular hepatic contours. Gallbladder not visualized and potentially surgically absent. No acute findings grossly relative to pancreas, spleen, adrenal glands or kidneys. Adrenal glands is cured by numerous retroperitoneal lymph nodes. No perinephric stranding or signs of hydronephrosis. Urinary bladder is collapsed limiting assessment.   No acute gastrointestinal process. Unremarkable appearance of reproductive structures.   SKELETON: Low level diffuse FDG activity within marrow spaces throughout the spine and pelvis. No focal area of increased metabolic activity.   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 Compression fracture at the L2 level with chronic appearance similar to chest radiograph of October 08, 2014 in terms of mild kyphotic angulation of the spine at this level.   IMPRESSION: 1. Generalized adenopathy perhaps slightly greatest in the abdomen in the retroperitoneum but seen throughout the low neck, chest, abdomen and pelvis. Findings are most suspicious for lymphoproliferative disorder showing mild-to-moderate FDG uptake. FDG uptake is greater than blood pool and liver. 2. Spleen top normal size with low level FDG uptake less than liver activity. 3. Low level diffuse FDG activity within marrow spaces throughout the spine and pelvis. No focal area of increased metabolic activity. In the absence of marrow stimulation findings are concerning for marrow space involvement. 4. Post aortic valve replacement and ascending thoracic aortic repair. 5. Hepatic steatosis. 6.  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 Increased density of the liver can be seen in the setting of amiodarone deposition. Correlate with any clinical or laboratory evidence of liver disease.     Electronically Signed   By: Zetta Bills M.D.   On: 06/18/2022 17:16   Assessment & Plan:  Mallory Sutton is a 62 y.o. female with adenopathy and concern for lymphoma. She is very thin and this is easily palpable. The nodes are just under the skin on the PET.   I reached out to Dr. Delton Coombes and to Ms. Arlington Calix /Dr. [name] Cardiology about her need for lymph node biopsy and her visits today. Dr. Delton Coombes said an inguinal node would be a good sample and Dr. [name] agreed at that time with holding her coumadin for 3 days prior and checking and INR.     Discussed biopsy in the groin on the right. Risk of bleeding, infection, lymph leak, seroma, and non diagnostic findings.    Will get the patient on the schedule and call her tomorrow with the date and time.    All questions were answered to the satisfaction of the patient and family.         Future Appointments  Date Time Provider [place]  06/23/2022  1:30 PM Derek Jack, MD [facility] None  06/23/2022  3:00 PM [facility]  06/23/2022  3:30 PM Finis Bud, NP [facility]  06/25/2022 11:20 AM Benjamine Mola, Resa Miner, MD [facility] None  08/24/2022 10:45 AM Josue Hector, MD [facility] St[place]ie [place] 06/23/2022, 10:58 AM

## 2022-06-25 NOTE — Progress Notes (Addendum)
Progress Note  Patient Name: Mallory Sutton Date of Encounter: 06/25/2022  Primary Cardiologist: Jenkins Rouge, MD  Subjective   No acute events overnight. She underwent lymph node biopsy this morning.  Patient received Coumadin 3 mg dose on 06/24/2022 at 1:42 AM.  No complaints, feels like her heart is skipping a beat.  Telemetry reviewed, showed normal sinus rhythm with PACs.  Inpatient Medications    Scheduled Meds:  aspirin EC  81 mg Oral Daily   Chlorhexidine Gluconate Cloth  6 each Topical Q0600   gabapentin  300 mg Oral BID AC   gabapentin  600 mg Oral QHS   metoprolol succinate  12.5 mg Oral Daily   mometasone-formoterol  2 puff Inhalation BID   pantoprazole  40 mg Oral Daily   predniSONE  20 mg Oral Q breakfast   propafenone  225 mg Oral Q8H   sertraline  150 mg Oral Daily   sodium chloride flush  3 mL Intravenous Q12H   umeclidinium bromide  1 puff Inhalation Daily   Continuous Infusions:  heparin     lactated ringers 50 mL/hr at 06/25/22 0949   PRN Meds: acetaminophen **OR** acetaminophen, albuterol, diazepam, fentaNYL (SUBLIMAZE) injection, HYDROcodone-acetaminophen, nitroGLYCERIN, polyethylene glycol   Vital Signs    Vitals:   06/25/22 0816 06/25/22 0830 06/25/22 0832 06/25/22 0938  BP: (!) 158/78  (!) 143/71 (!) 167/78  Pulse: 68 63 67 91  Resp: 16 12 13   $ Temp: 98 F (36.7 C)     TempSrc:      SpO2: 100% 100% 100%   Weight:      Height:        Intake/Output Summary (Last 24 hours) at 06/25/2022 1041 Last data filed at 06/25/2022 0807 Gross per 24 hour  Intake 1026.71 ml  Output 0 ml  Net 1026.71 ml   Filed Weights   06/24/22 0300 06/25/22 0500 06/25/22 0639  Weight: 48.3 kg 49.6 kg 49.6 kg    Telemetry     Personally reviewed, NSR and PACs  ECG    EKG not performed today  Physical Exam   GEN: No acute distress.   Neck: No JVD. Cardiac: RRR, no murmur, rub, or gallop.  Respiratory: Nonlabored. Clear to auscultation  bilaterally. GI: Soft, nontender, bowel sounds present. MS: No edema; No deformity. Neuro:  Nonfocal. Psych: Alert and oriented x 3. Normal affect.  Labs    Chemistry Recent Labs  Lab 06/23/22 1417 06/23/22 1900 06/24/22 0532 06/25/22 0449  NA 137 137 139 139  K 3.2* 2.8* 4.1 3.6  CL 98 96* 106 107  CO2 26 27 26 25  $ GLUCOSE 113* 105* 103* 91  BUN 19 21* 19 16  CREATININE 1.04* 1.03* 0.82 0.91  CALCIUM 9.7 9.3 7.9* 8.3*  PROT 7.4 7.2  --   --   ALBUMIN 4.2 4.1  --   --   AST 38 35  --   --   ALT 28 27  --   --   ALKPHOS 89 86  --   --   BILITOT 0.6 0.8  --   --   GFRNONAA >60 >60 >60 >60  ANIONGAP 13 14 7 7     $ Hematology Recent Labs  Lab 06/23/22 1900 06/24/22 0532 06/25/22 0449  WBC 19.5* 19.8* 11.4*  RBC 4.63 3.63* 3.43*  HGB 12.1 9.6* 9.2*  HCT 38.5 31.1* 30.2*  MCV 83.2 85.7 88.0  MCH 26.1 26.4 26.8  MCHC 31.4 30.9 30.5  RDW 15.6*  15.9* 16.1*  PLT 466* 312 262    Cardiac Enzymes Recent Labs  Lab 06/23/22 1900 06/23/22 2030  TROPONINIHS 14 11    BNPNo results for input(s): "BNP", "PROBNP" in the last 168 hours.   DDimer Recent Labs  Lab 06/23/22 1900  DDIMER <0.27     Radiology    DG Chest 2 View  Result Date: 06/23/2022 CLINICAL DATA:  Chest pain and shortness of breath EXAM: CHEST - 2 VIEW COMPARISON:  02/03/2022 FINDINGS: Cardiac shadow is within normal limits. Postsurgical changes are again seen and stable. Lungs are well aerated bilaterally. No focal infiltrate or effusion is noted. Previously seen nipple shadows are less well appreciated on today's exam. No bony abnormality is noted. IMPRESSION: No acute abnormality noted. Electronically Signed   By: Inez Catalina M.D.   On: 06/23/2022 18:47    Cardiac Studies  Echocardiogram 03/2022 LVEF 65 to 70% Moderate LVH Grade 2 DD RV systolic function is normal RVSP is normal Aortic valve has been repaired/replaced, aortic valve regurgitation is mild to moderate, cannot exclude  perivalvular component.  There is a 19 mm On-X valve present in the aortic position.  Aortic valve mean gradient measured 28 mmHg and similar to prior study.  Dimensionless index 0.5 argues against prosthetic stenosis. No intracardiac shunting on echocardiogram from 05/2022   Assessment & Plan    # Paroxysmal atrial fibrillation s/p DCCV in 2021 and 2024, s/p maze procedure and LAAO surgical occlusion in 2016  -Patient continued to have breakthrough atrial arrhythmias despite being on propafenone. She underwent DCCV on 06/24/2022 in the ER. Will continue propafenone to 25 mg every 8 hours and metoprolol succinate 12.5 mg once daily. Even though she underwent a surgical occlusion of the left atrial appendage in 2016, TEE in 2021 showed residual left atrial appendage with velocities 20 cm/s necessitating to be on Coumadin with INR goal between 2 and 3 for stroke prophylaxis. Patient underwent lymph node biopsy this morning. No bleeding complications. Resume Coumadin home dose (no need of bridging, last dose of Coumadin was on 06/24/2022). -Will schedule outpatient CT cardiac to rule out severe CAD due to persistent chest tightness although this chest tightness could be from enlarged lymph nodes/lung nodules. Follow-up with EP outpatient to switch propafenone to Tikosyn.  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 The pressure gradient across the descending aorta was 47 mmHg per echocardiogram in 2023.  # HTN, poorly controlled and likely  secondary to CoA -Start Amlodipine 5 mg once daily  [facility] will sign off.   Medication Recommendations: Propafenone 225 mg every 8 hours, metoprolol succinate 12.5 mg once daily, warfarin home dose with goal INR between 2 and 3, aspirin 81 mg once daily, Amlodipine 5 mg once daily. Other recommendations (labs, testing, etc): CTA cardiac outpatient Follow up as an outpatient: Keep appointment with Dr. [name] on 08/24/2022.   I have spent a total of 33 minutes with patient reviewing chart , telemetry, EKGs, labs and examining patient as well as establishing an  assessment and plan that was discussed with the patient.  > 50% of time was spent in direct patient care.      Signed, Chalmers Guest, MD  06/25/2022, 10:41 AM

## 2022-06-25 NOTE — Anesthesia Postprocedure Evaluation (Signed)
Anesthesia Post Note  Patient: Mallory Sutton  Procedure(s) Performed: LYMPH NODE BIOPSY; right groin (Right: Groin)  Patient location during evaluation: PACU Anesthesia Type: MAC Level of consciousness: awake and alert and oriented Pain management: pain level controlled Vital Signs Assessment: post-procedure vital signs reviewed and stable Respiratory status: spontaneous breathing, nonlabored ventilation, respiratory function stable and patient connected to nasal cannula oxygen Cardiovascular status: blood pressure returned to baseline and stable Postop Assessment: no apparent nausea or vomiting Anesthetic complications: no  No notable events documented.   Last Vitals:  Vitals:   06/25/22 0938 06/25/22 1000  BP: (!) 167/78 (!) 151/91  Pulse: 91 81  Resp:  16  Temp:  36.7 C  SpO2:  100%    Last Pain:  Vitals:   06/25/22 1000  TempSrc: Oral  PainSc:                  Aulden Calise C Cuthbert Turton

## 2022-06-25 NOTE — Op Note (Signed)
[facility] Operative Note  06/25/22  Preoperative Diagnosis: Adenopathy   Postoperative Diagnosis: Same   Procedure(s) Performed: Right inguinal lymph node biopsy   Surgeon: Ria Comment C. Constance Haw, MD   Assistants: No qualified resident was available    Anesthesia: Monitored anesthesia care and lidocaine 1%   Anesthesiologist: Denese Killings, MD    Specimens: 2cm lymph node    Estimated Blood Loss: Minimal   Blood Replacement: None    Complications: None   Wound Class: Clean   Operative Indications: Mallory Sutton is a 62 yo with adenopathy who needs a biopsy. She has the complication of a recent cardioversion and needs to stay on anticoagulation with minimal disruption after this event.  We discussed bleeding, infection, non-diagnostic biopsy, seroma/ chyle leak. She had her heparin held prior to the procedure.    Findings: Large 2cm superficial node    Procedure: The patient was taken to the operating room and placed supine. Monitored anesthesia care was induced. Intravenous antibiotics were administered per protocol.  The right inguinal region was prepped and draped. Lidocaine 1% was injected around the palpable lymph node.  An incision was made and carried down with cautery to minimize any bleeding potential.  I identified the lymph node and carefully excised it with a small ligasure device, ensuring the lymph channels and blood vessels around it were sealed.   The lymph node was removed and sent to pathology. The cavity was hemostatic. The deep space was closed with 3-0 Vicryl interrupted, attempting to close down the space.  The skin was closed with 4-0 Monocryl and dermabond.   Final inspection revealed acceptable hemostasis. All counts were correct at the end of the case. The patient was awakened from anesthesia without complication.  The patient went to the PACU in stable condition.   Curlene Labrum, MD [facility] [address] [place], [place] [address] [phone] (office)

## 2022-06-25 NOTE — Anesthesia Preprocedure Evaluation (Signed)
Anesthesia Evaluation  Patient identified by MRN, date of birth, ID band Patient awake    Reviewed: Allergy & Precautions, H&P , NPO status , Patient's Chart, lab work & pertinent test results, reviewed documented beta blocker date and time   Airway Mallampati: II  TM Distance: >3 FB Neck ROM: Full    Dental  (+) Missing, Dental Advisory Given   Pulmonary shortness of breath, with exertion and Long-Term Oxygen Therapy, COPD,  COPD inhaler and oxygen dependent          Cardiovascular Exercise Tolerance: Poor (-) hypertensionPt. on home beta blockers +CHF  + dysrhythmias Atrial Fibrillation + Valvular Problems/Murmurs (AVR - 07/25/14, 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 07/25/14 [facility]) AS  Rhythm:Regular Rate:Normal + Systolic murmurs    Neuro/Psych  PSYCHIATRIC DISORDERS Anxiety Depression    negative neurological ROS     GI/Hepatic Neg liver ROS,GERD  Medicated and Controlled,,  Endo/Other  negative endocrine ROS    Renal/GU negative Renal ROS  negative genitourinary   Musculoskeletal negative musculoskeletal ROS (+)    Abdominal   Peds negative pediatric ROS (+)  Hematology negative hematology ROS (+)   Anesthesia Other Findings On Prednisone for vasculitis   Reproductive/Obstetrics negative OB ROS                             Anesthesia Physical Anesthesia Plan  ASA: 4  Anesthesia Plan: MAC   Post-op Pain Management: Minimal or no pain anticipated   Induction: Intravenous  PONV Risk Score and Plan: 1 and Ondansetron and Dexamethasone  Airway Management Planned: Nasal Cannula, Natural Airway and Simple Face Mask  Additional Equipment:   Intra-op Plan:   Post-operative Plan:   Informed Consent: I have reviewed the patients History and Physical, chart, labs and discussed the procedure including the risks, benefits and alternatives for the proposed anesthesia with  the patient or authorized representative who has indicated his/her understanding and acceptance.     Dental advisory given  Plan Discussed with: CRNA and Surgeon  Anesthesia Plan Comments: (On Prednisone for vasculitis, will give dexamethasone intraop)        Anesthesia Quick Evaluation

## 2022-06-26 ENCOUNTER — Other Ambulatory Visit (HOSPITAL_COMMUNITY): Payer: Self-pay

## 2022-06-26 ENCOUNTER — Telehealth: Payer: Self-pay | Admitting: Pharmacist

## 2022-06-26 ENCOUNTER — Other Ambulatory Visit: Payer: Self-pay | Admitting: Cardiology

## 2022-06-26 DIAGNOSIS — R079 Chest pain, unspecified: Secondary | ICD-10-CM

## 2022-06-26 DIAGNOSIS — I4891 Unspecified atrial fibrillation: Secondary | ICD-10-CM | POA: Diagnosis not present

## 2022-06-26 LAB — HEPARIN LEVEL (UNFRACTIONATED)
Heparin Unfractionated: >1.1 IU/mL — ABNORMAL HIGH (ref 0.30–0.70)
Heparin Unfractionated: 0.42 IU/mL (ref 0.30–0.70)

## 2022-06-26 LAB — CBC
HCT: 33.1 % — ABNORMAL LOW (ref 36.0–46.0)
Hemoglobin: 9.8 g/dL — ABNORMAL LOW (ref 12.0–15.0)
MCH: 26.2 pg (ref 26.0–34.0)
MCHC: 29.6 g/dL — ABNORMAL LOW (ref 30.0–36.0)
MCV: 88.5 fL (ref 80.0–100.0)
Platelets: 279 10*3/uL (ref 150–400)
RBC: 3.74 MIL/uL — ABNORMAL LOW (ref 3.87–5.11)
RDW: 15.9 % — ABNORMAL HIGH (ref 11.5–15.5)
WBC: 17.1 10*3/uL — ABNORMAL HIGH (ref 4.0–10.5)
nRBC: 0 % (ref 0.0–0.2)

## 2022-06-26 LAB — BASIC METABOLIC PANEL
Anion gap: 7 (ref 5–15)
BUN: 11 mg/dL (ref 6–20)
CO2: 26 mmol/L (ref 22–32)
Calcium: 8.1 mg/dL — ABNORMAL LOW (ref 8.9–10.3)
Chloride: 104 mmol/L (ref 98–111)
Creatinine, Ser: 0.69 mg/dL (ref 0.44–1.00)
GFR, Estimated: >60 mL/min (ref >60)
Glucose, Bld: 99 mg/dL (ref 70–99)
Potassium: 3.9 mmol/L (ref 3.5–5.1)
Sodium: 137 mmol/L (ref 135–145)

## 2022-06-26 LAB — PROTIME-INR
INR: 1.6 — ABNORMAL HIGH (ref 0.8–1.2)
Prothrombin Time: 19.2 seconds — ABNORMAL HIGH (ref 11.4–15.2)

## 2022-06-26 MED ORDER — WARFARIN SODIUM 5 MG PO TABS
6.0000 mg | ORAL_TABLET | Freq: Once | ORAL | Status: AC
  Administered 2022-06-26: 6 mg via ORAL
  Filled 2022-06-26: qty 1

## 2022-06-26 MED ORDER — METOPROLOL SUCCINATE ER 25 MG PO TB24
25.0000 mg | ORAL_TABLET | Freq: Every day | ORAL | Status: DC
  Administered 2022-06-26 – 2022-06-27 (×2): 25 mg via ORAL
  Filled 2022-06-26 (×2): qty 1

## 2022-06-26 NOTE — Telephone Encounter (Signed)
Medication list reviewed in anticipation of upcoming Tikosyn initiation. Patient is not taking any contraindicated medications but is on 2 QTc prolonging medications.   Ondansetron and sertraline are both QTc prolonging agents. Risk is higher with IV ondansetron and it appears patient is on oral prn. If no alternative for n/v is available would recommend sparing use and if more frequent use is needed, then checking QTc more frequently could be warranted. Sertraline also increases QTc. I would recommend switching to an antidepressant that does not cause QTc prolongation such as duloxetine or desvenlafaxine. If patient is not able to change, then close monitoring is again warranted   Patient should stop propafenone 3 days prior to admission for Tikosyn.   Patient is anticoagulated on warfarin. Patient will need 3 consecutive weekly therapeutic INR and a therapeutic INR on the day of admission.  Patient is in the hospital currently. If her heparin drip remains therapeutic and continues until INR is therapeutic. Her 3 weeks could start from when her heparin drip was therapeutic.   Patient will need to be counseled to avoid use of Benadryl while on Tikosyn and in the 2-3 days prior to Tikosyn initiation.

## 2022-06-26 NOTE — Progress Notes (Signed)
1 Day Post-Op  Subjective: Patient has minimal incisional pain.  Objective: Vital signs in last 24 hours: Temp:  [98.5 F (36.9 C)-98.6 F (37 C)] 98.6 F (37 C) (02/23 0358) Pulse Rate:  [79-86] 85 (02/23 0905) Resp:  [18-21] 18 (02/23 0905) BP: (149-151)/(71-91) 151/91 (02/23 0358) SpO2:  [95 %-100 %] 95 % (02/23 0905) Weight:  [51.9 kg] 51.9 kg (02/23 0500) Last BM Date : 06/25/22  Intake/Output from previous day: 02/22 0701 - 02/23 0700 In: 1635.2 [P.O.:720; I.V.:915.2] Out: 0  Intake/Output this shift: No intake/output data recorded.  General appearance: alert, cooperative, and no distress Skin: Right groin incision healing well.  No hematoma or ecchymosis.  Lab Results:  Recent Labs    06/25/22 0449 06/26/22 0633  WBC 11.4* 17.1*  HGB 9.2* 9.8*  HCT 30.2* 33.1*  PLT 262 279   BMET Recent Labs    06/25/22 0449 06/26/22 0633  NA 139 137  K 3.6 3.9  CL 107 104  CO2 25 26  GLUCOSE 91 99  BUN 16 11  CREATININE 0.91 0.69  CALCIUM 8.3* 8.1*   PT/INR Recent Labs    06/24/22 1408 06/26/22 0633  LABPROT 20.3* 19.2*  INR 1.8* 1.6*    Studies/Results: No results found.  Anti-infectives: Anti-infectives (From admission, onward)    Start     Dose/Rate Route Frequency Ordered Stop   06/25/22 0600  vancomycin (VANCOCIN) IVPB 1000 mg/200 mL premix        1,000 mg 200 mL/hr over 60 Minutes Intravenous On call to O.R. 06/24/22 1639 06/25/22 0756       Assessment/Plan: s/p Procedure(s): LYMPH NODE BIOPSY; right groin Impression: Doing well, status post right inguinal lymph node biopsy.  Final pathology pending.  Will follow peripherally with you.  LOS: 2 days    Aviva Signs 06/26/2022

## 2022-06-26 NOTE — TOC Benefit Eligibility Note (Signed)
Patient Teacher, English as a foreign language completed.    The patient is currently admitted and upon discharge could be taking doefetilide (Tikosyn) 500 mcg capsules.  The current 30 day co-pay is $4.00.   The patient is insured through [place], [place] Patient Advocate Specialist [place] Patient Advocate Team Direct Number: [phone]  Fax: [phone]

## 2022-06-26 NOTE — Progress Notes (Signed)
PROGRESS NOTE     Mallory Sutton, is a 62 y.o. female, DOB - [birth year], [id number]  Admit date - 06/23/2022   Admitting Physician Minah Axelrod Denton Brick, MD  Outpatient Primary MD for the patient is Valentino Nose, FNP  LOS - 2  Chief Complaint  Patient presents with   Atrial Fibrillation      Brief Narrative:  62 y.o. 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 age 13, aortic valve and arch repair in 2016, atrial fibrillation on warfarin and status post MAZE and left atrial appendage occlusion, anxiety, COPD, and chronic hypoxic respiratory failure admitted on 06/23/22 with A-fib with RVR status post cardioversion on 06/23/2022     -Assessment and Plan: 1)Persistent Afib with RVR--status post cardioversion on 06/23/2022 -Paroxysmal atrial fibrillation s/p DCCV in 2021 and 06/24/2022, s/p maze procedure and LAAO surgical occlusion in 2016  -Per cardiologist okay to continue metoprolol and propafenone at this time -Patient does Not want to use amiodarone -Patient had surgical occlusion of left atrial appendage in 2016. TEE in 2021 showed residual left atrial appendage with velocities 20 cm/s. Continue Coumadin with INR goal between 2 and 3  06/26/22 -Continue heparin bridge  (had Lymph Node biopsy on 06/25/22) especially given recent cardioversion on 06/23/2022 -Pharmacy to manage Coumadin -INR trended down currently down to 1.6, recheck PT/INR in a.m. -Potassium replaced and normalized,  magnesium WNL -Cardiology recommends outpatient follow-up with EP outpatient to switch propafenone to Tikosyn.  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 The pressure gradient across the descending aorta was 47 mmHg.  4) possible lymphoma--- lymphadenopathy noted previously evaluated by oncologist Dr. Delton Coombes  -s/p Lymph Node biopsy by general surgeon Dr. Constance Haw on 06/25/22 -Pathology pending - 5) anxiety disorder--- continue benzos and Zoloft  6) chronic hypoxic respiratory failure secondary to COPD-- -appears stable --at home patient uses up to 3 L of oxygen via nasal cannula =-Currently requiring only 2 L per nasal cannula -Continue bronchodilators  Status is: Inpatient   Disposition: The patient is from: Home              Anticipated d/c is to: Home              Anticipated d/c date is: 1 day              Patient currently is not medically stable to d/c. Barriers: Not Clinically Stable-   Code Status :  -  Code Status: Full Code   Family Communication:    NA (patient is alert, awake and coherent)   DVT Prophylaxis  :   - SCDs /Coumadin/Heparin    Lab Results  Component Value Date   PLT 279 06/26/2022    Inpatient Medications  Scheduled Meds:  amLODipine  5 mg Oral Daily   aspirin EC  81 mg Oral Daily   gabapentin  300 mg Oral  BID AC   gabapentin  600 mg Oral QHS   metoprolol succinate  25 mg Oral Daily   mometasone-formoterol  2 puff Inhalation BID   pantoprazole  40 mg Oral Daily   predniSONE  20 mg Oral Q breakfast   propafenone  225 mg Oral Q8H   sertraline  150 mg Oral Daily   sodium chloride flush  3 mL Intravenous Q12H   umeclidinium bromide  1 puff Inhalation Daily   Warfarin - Pharmacist Dosing Inpatient   Does not apply q1600   Continuous Infusions:  heparin  1,050 Units/hr (06/26/22 0509)   PRN Meds:.acetaminophen **OR** acetaminophen, albuterol, diazepam, fentaNYL (SUBLIMAZE) injection, HYDROcodone-acetaminophen, nitroGLYCERIN, polyethylene glycol   Anti-infectives (From admission, onward)    Start     Dose/Rate Route Frequency Ordered Stop   06/25/22 0600  vancomycin (VANCOCIN) IVPB 1000 mg/200 mL premix        1,000 mg 200 mL/hr over 60 Minutes Intravenous On call to O.R. 06/24/22 1639 06/25/22 0756       Subjective: Mallory Sutton today has no fevers, no emesis,  No chest pain,   - -Episode of tachycardia earlier this morning/overnight--- associated with dizziness and shortness of breath- Anticipate discharge home on 06/27/2022 if INR starts to trend back up  Objective: Vitals:   06/25/22 2051 06/26/22 0358 06/26/22 0500 06/26/22 0905  BP: (!) 149/71 (!) 151/91    Pulse: 79 86  85  Resp: 20 (!) 21  18  Temp: 98.5 F (36.9 C) 98.6 F (37 C)    TempSrc: Oral Oral    SpO2: 100% 98%  95%  Weight:   51.9 kg   Height:        Intake/Output Summary (Last 24 hours) at 06/26/2022 1046 Last data filed at 06/26/2022 0509 Gross per 24 hour  Intake 1135.18 ml  Output --  Net 1135.18 ml   Filed Weights   06/25/22 0500 06/25/22 0639 06/26/22 0500  Weight: 49.6 kg 49.6 kg 51.9 kg   Physical Exam Gen:- Awake Alert, in no acute distress HEENT:- [place].AT, No sclera icterus Nose- [place] 2L/min Neck-Supple Neck,No JVD,.  Lungs-  fair symmetrical air movement, no wheezing CV- S1, S2 normal, regular , prior Sternotomy Abd-  +ve B.Sounds, Abd Soft, No tenderness,    Extremity/Skin:- No  edema, pedal pulses present  Psych-affect is appropriate, oriented x3 Neuro-no new focal deficits, no tremors MSK-right groin biopsy site is hemostatic ??  Right inguinal hernia-reducible  Data Reviewed: I have personally reviewed following labs and imaging studies  CBC: Recent Labs  Lab 06/23/22 1417 06/23/22 1900 06/24/22 0532 06/25/22 0449  06/26/22 0633  WBC 21.8* 19.5* 19.8* 11.4* 17.1*  NEUTROABS 17.1*  --   --   --   --   HGB 12.4 12.1 9.6* 9.2* 9.8*  HCT 37.9 38.5 31.1* 30.2* 33.1*  MCV 81.2 83.2 85.7 88.0 88.5  PLT 446* 466* 312 262 [id number]   Basic Metabolic Panel: Recent Labs  Lab 06/23/22 1417 06/23/22 1900 06/24/22 0532 06/25/22 0449 06/26/22 0633  NA 137 137 139 139 137  K 3.2* 2.8* 4.1 3.6 3.9  CL 98 96* 106 107 104  CO2 '26 27 26 25 26  '$ GLUCOSE 113* 105* 103* 91 99  BUN 19 21* '19 16 11  '$ CREATININE 1.04* 1.03* 0.82 0.91 0.69  CALCIUM 9.7 9.3 7.9* 8.3* 8.1*  MG  --  2.4 2.2  --   --    GFR: Estimated Creatinine Clearance: 61.3 mL/min (by C-G formula  based on SCr of 0.69 mg/dL). Liver Function Tests: Recent Labs  Lab 06/23/22 1417 06/23/22 1900  AST 38 35  ALT 28 27  ALKPHOS 89 86  BILITOT 0.6 0.8  PROT 7.4 7.2  ALBUMIN 4.2 4.1   Recent Results (from the past 240 hour(s))  Resp panel by RT-PCR (RSV, Flu A&B, Covid) Anterior Nasal Swab     Status: None   Collection Time: 06/23/22  7:25 PM   Specimen: Anterior Nasal Swab  Result Value Ref Range Status   SARS Coronavirus 2 by RT PCR NEGATIVE NEGATIVE Final    Comment: (NOTE) SARS-CoV-2 target nucleic acids are NOT DETECTED.  The SARS-CoV-2 RNA is generally detectable in upper respiratory specimens during the acute phase of infection. The lowest concentration of SARS-CoV-2 viral copies this assay can detect is 138 copies/mL. A negative result does not preclude SARS-Cov-2 infection and should not be used as the sole basis for treatment or other patient management decisions. A negative result may occur with  improper specimen collection/handling, submission of specimen other than nasopharyngeal swab, presence of viral mutation(s) within the areas targeted by this assay, and inadequate number of viral copies(<138 copies/mL). A negative result must be combined with clinical observations, patient history, and epidemiological information. The  expected result is Negative.  Fact Sheet for Patients:  EntrepreneurPulse.com.au  Fact Sheet for Healthcare Providers:  IncredibleEmployment.be  This test is no t yet approved or cleared by the [place] FDA and  has been authorized for detection and/or diagnosis of SARS-CoV-2 by FDA under an Emergency Use Authorization (EUA). This EUA will remain  in effect (meaning this test can be used) for the duration of the COVID-19 declaration under Section 564(b)(1) of the Act, 21 U.S.C.section 360bbb-3(b)(1), unless the authorization is terminated  or revoked sooner.       Influenza A by PCR NEGATIVE NEGATIVE Final   Influenza B by PCR NEGATIVE NEGATIVE Final    Comment: (NOTE) The Xpert Xpress SARS-CoV-2/FLU/RSV plus assay is intended as an aid in the diagnosis of influenza from Nasopharyngeal swab specimens and should not be used as a sole basis for treatment. Nasal washings and aspirates are unacceptable for Xpert Xpress SARS-CoV-2/FLU/RSV testing.  Fact Sheet for Patients: EntrepreneurPulse.com.au  Fact Sheet for Healthcare Providers: IncredibleEmployment.be  This test is not yet approved or cleared by the [place] FDA and has been authorized for detection and/or diagnosis of SARS-CoV-2 by FDA under an Emergency Use Authorization (EUA). This EUA will remain in effect (meaning this test can be used) for the duration of the COVID-19 declaration under Section 564(b)(1) of the Act, 21 U.S.C. section 360bbb-3(b)(1), unless the authorization is terminated or revoked.     Resp Syncytial Virus by PCR NEGATIVE NEGATIVE Final    Comment: (NOTE) Fact Sheet for Patients: EntrepreneurPulse.com.au  Fact Sheet for Healthcare Providers: IncredibleEmployment.be  This test is not yet approved or cleared by the [place] FDA and has been authorized for detection and/or  diagnosis of SARS-CoV-2 by FDA under an Emergency Use Authorization (EUA). This EUA will remain in effect (meaning this test can be used) for the duration of the COVID-19 declaration under Section 564(b)(1) of the Act, 21 U.S.C. section 360bbb-3(b)(1), unless the authorization is terminated or revoked.  Performed at [facility], [address] [place], [place] [address]   MRSA Next Gen by PCR, Nasal     Status: None   Collection Time: 06/24/22  1:43 AM   Specimen: Nasal Mucosa; Nasal Swab  Result Value  Ref Range Status   MRSA by PCR Next Gen NOT DETECTED NOT DETECTED Final    Comment: (NOTE) The GeneXpert MRSA Assay (FDA approved for NASAL specimens only), is one component of a comprehensive MRSA colonization surveillance program. It is not intended to diagnose MRSA infection nor to guide or monitor treatment for MRSA infections. Test performance is not FDA approved in patients less than 49 years old. Performed at [facility], [address] [place], [place] [address]     Radiology Studies: No results found.  Scheduled Meds:  amLODipine  5 mg Oral Daily   aspirin EC  81 mg Oral Daily   gabapentin  300 mg Oral BID AC   gabapentin  600 mg Oral QHS   metoprolol succinate  25 mg Oral Daily   mometasone-formoterol  2 puff Inhalation BID   pantoprazole  40 mg Oral Daily   predniSONE  20 mg Oral Q breakfast   propafenone  225 mg Oral Q8H   sertraline  150 mg Oral Daily   sodium chloride flush  3 mL Intravenous Q12H   umeclidinium bromide  1 puff Inhalation Daily   Warfarin - Pharmacist Dosing Inpatient   Does not apply q1600   Continuous Infusions:  heparin 1,050 Units/hr (06/26/22 0509)    LOS: 2 days   Roxan Hockey M.D on 06/26/2022 at 10:46 AM  Go to [email or web address] - for contact info  [facility] - Office  [phone]  If 7PM-7AM, please contact night-coverage [email or web address] 06/26/2022, 10:46 AM

## 2022-06-26 NOTE — Progress Notes (Addendum)
Patient weight this am is 51.9kg up from 49.6kg on yesterday. Patient states, "this is not normal for me, I don't ever weigh this much, it has to be fluid". MD made aware. Plan of care ongoing.

## 2022-06-26 NOTE — Progress Notes (Signed)
ANTICOAGULATION CONSULT NOTE - Follow Up Consult  Pharmacy Consult for Heparin>>warfarin Indication: atrial fibrillation and aortic valve replacement  (On-X)  Allergies  Allergen Reactions   Amiodarone Nausea Only   Penicillins Hives    Has patient had a PCN reaction causing immediate rash, facial/tongue/throat swelling, SOB or lightheadedness with hypotension: YES Has patient had a PCN reaction causing severe rash involving mucus membranes or skin necrosis: NO Has patient had a PCN reaction that required hospitalization NO Has patient had a PCN reaction occurring within the last 10 years: NO If all of the above answers are "NO", then may proceed with Cephalosporin use.     Patient Measurements: Height: '5\' 4"'$  (162.6 cm) Weight: 51.9 kg (114 lb 6.7 oz) IBW/kg (Calculated) : 54.7 Heparin Dosing Weight: 49.6 kg  Vital Signs: Temp: 99.1 F (37.3 C) (02/23 1255) Temp Source: Oral (02/23 1255) BP: 149/84 (02/23 1255) Pulse Rate: 86 (02/23 1255)  Labs: Recent Labs    06/23/22 1900 06/23/22 2030 06/24/22 0532 06/24/22 1408 06/24/22 2317 06/25/22 0449 06/25/22 1851 06/26/22 0633 06/26/22 0848  HGB 12.1  --  9.6*  --   --  9.2*  --  9.8*  --   HCT 38.5  --  31.1*  --   --  30.2*  --  33.1*  --   PLT 466*  --  312  --   --  262  --  279  --   LABPROT 22.6*  --  20.7* 20.3*  --   --   --  19.2*  --   INR 2.0*  --  1.8* 1.8*  --   --   --  1.6*  --   HEPARINUNFRC  --   --   --   --    < >  --  0.19* >1.10* 0.42  CREATININE 1.03*  --  0.82  --   --  0.91  --  0.69  --   TROPONINIHS 14 11  --   --   --   --   --   --   --    < > = values in this interval not displayed.     Estimated Creatinine Clearance: 61.3 mL/min (by C-G formula based on SCr of 0.69 mg/dL).  Assessment: 62 yo female on warfarin PTA for atrial fibrillation. Warfarin held 2/21 pm for biopsy today. IV heparin resumed without bolus at ~12n post-procedure. Heparin level prior to procedure subtherapeutic at  <0.10 on 700 units/hr. Hemoglobin 9.2 today trended down from 12.4 on 2/20, platelets 262. No bleeding reported.  Heparin level today was at goal on repeat.  INR trended down to 1.6. No bleeding issues noted.  PTA warfarin regimen: 3 mg daily except 4 mg on Fridays.  Goal of Therapy:  Heparin level 0.3-0.7 units/ml Monitor platelets by anticoagulation protocol: Yes   Plan:  Warfarin '6mg'$  today Continue heparin drip to 1050 units/hr. Next heparin level and CBC in am. Daily PT/INR. Stop heparin when INR at goal 2-3.  Erin Hearing PharmD., BCPS Clinical Pharmacist 06/26/2022 3:03 PM

## 2022-06-27 DIAGNOSIS — I4891 Unspecified atrial fibrillation: Secondary | ICD-10-CM | POA: Diagnosis not present

## 2022-06-27 LAB — CBC
HCT: 31.7 % — ABNORMAL LOW (ref 36.0–46.0)
Hemoglobin: 9.6 g/dL — ABNORMAL LOW (ref 12.0–15.0)
MCH: 26.4 pg (ref 26.0–34.0)
MCHC: 30.3 g/dL (ref 30.0–36.0)
MCV: 87.1 fL (ref 80.0–100.0)
Platelets: 299 10*3/uL (ref 150–400)
RBC: 3.64 MIL/uL — ABNORMAL LOW (ref 3.87–5.11)
RDW: 15.9 % — ABNORMAL HIGH (ref 11.5–15.5)
WBC: 15.7 10*3/uL — ABNORMAL HIGH (ref 4.0–10.5)
nRBC: 0 % (ref 0.0–0.2)

## 2022-06-27 LAB — HEPARIN LEVEL (UNFRACTIONATED): Heparin Unfractionated: 0.25 IU/mL — ABNORMAL LOW (ref 0.30–0.70)

## 2022-06-27 LAB — PROTIME-INR
INR: 1.3 — ABNORMAL HIGH (ref 0.8–1.2)
Prothrombin Time: 16.1 seconds — ABNORMAL HIGH (ref 11.4–15.2)

## 2022-06-27 MED ORDER — ENOXAPARIN SODIUM 40 MG/0.4ML IJ SOSY: 40.0000 mg | PREFILLED_SYRINGE | Freq: Once | INTRAMUSCULAR | Status: DC

## 2022-06-27 MED ORDER — ENOXAPARIN SODIUM 40 MG/0.4ML IJ SOSY
40.0000 mg | PREFILLED_SYRINGE | Freq: Once | INTRAMUSCULAR | Status: AC
  Administered 2022-06-27: 40 mg via SUBCUTANEOUS
  Filled 2022-06-27: qty 0.4

## 2022-06-27 MED ORDER — VENTOLIN HFA 108 (90 BASE) MCG/ACT IN AERS: 2.0000 | INHALATION_SPRAY | RESPIRATORY_TRACT | 6 refills | Status: DC | PRN

## 2022-06-27 MED ORDER — PANTOPRAZOLE SODIUM 40 MG PO TBEC: 40.0000 mg | DELAYED_RELEASE_TABLET | Freq: Every day | ORAL | 2 refills | Status: DC

## 2022-06-27 MED ORDER — ENOXAPARIN SODIUM 40 MG/0.4ML IJ SOSY: 40.0000 mg | PREFILLED_SYRINGE | Freq: Two times a day (BID) | INTRAMUSCULAR | 0 refills | Status: DC

## 2022-06-27 MED ORDER — WARFARIN SODIUM 7.5 MG PO TABS
7.5000 mg | ORAL_TABLET | Freq: Once | ORAL | Status: AC
  Administered 2022-06-27: 7.5 mg via ORAL
  Filled 2022-06-27: qty 1

## 2022-06-27 MED ORDER — WARFARIN SODIUM 7.5 MG PO TABS: 7.5000 mg | ORAL_TABLET | Freq: Once | ORAL | Status: DC

## 2022-06-27 MED ORDER — FUROSEMIDE 40 MG PO TABS: 80.0000 mg | ORAL_TABLET | Freq: Every day | ORAL | 3 refills | Status: DC

## 2022-06-27 MED ORDER — IPRATROPIUM-ALBUTEROL 0.5-2.5 (3) MG/3ML IN SOLN: 3.0000 mL | Freq: Two times a day (BID) | RESPIRATORY_TRACT | 1 refills | Status: DC | PRN

## 2022-06-27 MED ORDER — METOPROLOL SUCCINATE ER 25 MG PO TB24: 25.0000 mg | ORAL_TABLET | Freq: Every day | ORAL | 3 refills | Status: DC

## 2022-06-27 MED ORDER — BREZTRI AEROSPHERE 160-9-4.8 MCG/ACT IN AERO: 2.0000 | INHALATION_SPRAY | Freq: Two times a day (BID) | RESPIRATORY_TRACT | 1 refills | Status: DC

## 2022-06-27 MED ORDER — DIAZEPAM 10 MG PO TABS: 10.0000 mg | ORAL_TABLET | Freq: Every day | ORAL | 0 refills | Status: DC | PRN

## 2022-06-27 MED ORDER — ASPIRIN EC 81 MG PO TBEC: 81.0000 mg | DELAYED_RELEASE_TABLET | Freq: Every day | ORAL | 11 refills | Status: DC

## 2022-06-27 MED ORDER — ACETAMINOPHEN 325 MG PO TABS: 650.0000 mg | ORAL_TABLET | Freq: Four times a day (QID) | ORAL | 1 refills | Status: DC | PRN

## 2022-06-27 MED ORDER — ZOLPIDEM TARTRATE 10 MG PO TABS: 10.0000 mg | ORAL_TABLET | Freq: Every evening | ORAL | 0 refills | Status: DC | PRN

## 2022-06-27 NOTE — Progress Notes (Addendum)
ANTICOAGULATION CONSULT NOTE - Follow Up Consult  Pharmacy Consult for Heparin>>warfarin Indication: atrial fibrillation and aortic valve replacement  (On-X)  Allergies  Allergen Reactions   Amiodarone Nausea Only   Penicillins Hives    Has patient had a PCN reaction causing immediate rash, facial/tongue/throat swelling, SOB or lightheadedness with hypotension: YES Has patient had a PCN reaction causing severe rash involving mucus membranes or skin necrosis: NO Has patient had a PCN reaction that required hospitalization NO Has patient had a PCN reaction occurring within the last 10 years: NO If all of the above answers are "NO", then may proceed with Cephalosporin use.     Patient Measurements: Height: '5\' 4"'$  (162.6 cm) Weight: 51.9 kg (114 lb 6.7 oz) IBW/kg (Calculated) : 54.7 Heparin Dosing Weight: 49.6 kg  Vital Signs: Temp: 98.2 F (36.8 C) (02/24 0603) Temp Source: Oral (02/24 0603) BP: 156/62 (02/24 0603) Pulse Rate: 61 (02/24 0603)  Labs: Recent Labs    06/24/22 1408 06/24/22 2317 06/25/22 0449 06/25/22 1851 06/26/22 0633 06/26/22 0848 06/27/22 0245  HGB  --    < > 9.2*  --  9.8*  --  9.6*  HCT  --   --  30.2*  --  33.1*  --  31.7*  PLT  --   --  262  --  279  --  299  LABPROT 20.3*  --   --   --  19.2*  --  16.1*  INR 1.8*  --   --   --  1.6*  --  1.3*  HEPARINUNFRC  --    < >  --    < > >1.10* 0.42 0.25*  CREATININE  --   --  0.91  --  0.69  --   --    < > = values in this interval not displayed.     Estimated Creatinine Clearance: 61.3 mL/min (by C-G formula based on SCr of 0.69 mg/dL).  Assessment: 62 yo Sutton on warfarin PTA for atrial fibrillation. Warfarin held 2/21 pm for biopsy today. IV heparin resumed without bolus at ~12n post-procedure. Heparin level prior to procedure subtherapeutic at <0.10 on 700 units/hr. Hemoglobin 9.2 today trended down from 12.4 on 2/20, platelets 262. No bleeding reported.  Heparin level was low this morning at 0.25  on 1050 units/hr. INR continues to trend down to 1.3. No bleeding issues noted.  PTA warfarin regimen: 3 mg daily except 4 mg on Fridays.  Goal of Therapy:  Heparin level 0.3-0.7 units/ml Monitor platelets by anticoagulation protocol: Yes   Plan:  Will give boost - Warfarin 7.'5mg'$  today Increase heparin drip to 1150 units/hr. Next heparin level and CBC in am. Daily PT/INR. Stop heparin when INR at goal 2-3.  Possible discharge plan discussed with primary team Consider switching heparin to enoxaparin '40mg'$  bid at discharge (qty 8 for possible 4 day supply) Will give warfarin 7.'5mg'$  today Would recommend '5mg'$  2/25 and then resume home dose on Monday 2/26 Recommend follow up INR check 2/26 or 2/27 as able in order to stop lovenox injections when INR >2  Erin Hearing PharmD., BCPS Clinical Pharmacist 06/27/2022 11:02 AM   Erin Hearing PharmD., BCPS Clinical Pharmacist 06/27/2022 8:00 AM

## 2022-06-27 NOTE — Discharge Summary (Signed)
Mallory Sutton, is a 62 y.o. female  DOB 1960/11/06  MRN [id number].  Admission date:  06/23/2022  Admitting Physician  Roxan Hockey, MD  Discharge Date:  06/27/2022   Primary MD  Valentino Nose, FNP  Recommendations for primary care physician for things to follow:   1)You Received Coumadin 7.5 mg x 1 dose at the hospital on 06/27/22, next dose of Coumadin will be 6 mg on Sunday 06/28/22....then back to your usual schedule of 3 mg daily (starting Monday 06/29/22) and '4mg'$  on Fridays  2)Repeat PT/INR on Monday 06/29/22 or Tuesday 06/30/22--- this should be done through the Coumadin clinic  3)Follow up with electrophysiology cardiology Dr. Crissie Sickles and your general cardiologist Dr. Earney Navy discuss possibly switching from Rythmol to [place]  4)You need oxygen at home at 2 L via nasal cannula continuously while awake and while asleep--- smoking or having open fires around oxygen can cause fire, significant injury and death  5)Inject under the skin Lovenox/enoxaparin 40 mg twice daily (10 am and 10 PM) until your INR is greater than 2.0. -No further Lovenox/enoxaparin injections are needed once your INR is  greater than 2.0  6)Follow up with general surgeon Dr. Blake Divine next week for results of your lymph node biopsy  Admission Diagnosis  Atrial fibrillation with RVR ([place]) [I48.91]   Discharge Diagnosis  Atrial fibrillation with RVR ([place]) [I48.91]   Principal Problem:   Atrial fibrillation with RVR ([place]) Active Problems:   COPD (chronic obstructive pulmonary disease) ([place])   Hypokalemia   Vasculitis ([facility])   Generalized lymphadenopathy   Chronic respiratory failure with hypoxia ([place])   Adenopathy      Past Medical History:  Diagnosis Date   Aortic stenosis    ATRIAL ARRHYTHMIAS    Bicuspid aortic valve    CHF (congestive heart failure) ([place])    COARCTATION OF AORTA    ENDOMETRIOSIS     Heart murmur    Paroxysmal atrial fibrillation ([place])    Shortness of breath    THORACIC AORTIC ANEURYSM    Vasculitis ([place])     Past Surgical History:  Procedure Laterality Date   CARDIOVERSION N/A 08/29/2019   Procedure: CARDIOVERSION;  Surgeon: Arnoldo Lenis, MD;  Location: [facility];  Service: Endoscopy;  Laterality: N/A;   coarctation repair and residual restenosis     KNEE SURGERY     TEE WITHOUT CARDIOVERSION N/A 07/12/2019   Procedure: TRANSESOPHAGEAL ECHOCARDIOGRAM (TEE);  Surgeon: Sueanne Margarita, MD;  Location[facility] ENDOSCOPY;  Service: Cardiovascular;  Laterality: N/A;   TEE WITHOUT CARDIOVERSION N/A 08/29/2019   Procedure: TRANSESOPHAGEAL ECHOCARDIOGRAM (TEE) WITH PROPOFOL;  Surgeon: Arnoldo Lenis, MD;  Location: [facility];  Service: Endoscopy;  Laterality: N/A;     HPI  from the history and physical done on the day of admission:    Chief Complaint: SOB, palpitations    HPI: Mallory Sutton is a 62 y.o. 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   Patient reports suffering from rapid palpitations and dyspnea since 06/18/2022.  She was seen in the cardiology clinic for this today, noted to be in rapid atrial flutter, and was directed to the ED.   ED Course: Upon arrival to the ED, patient is found to be afebrile and saturating well on 3 L/min of supplemental oxygen with elevated heart rate and low-normal blood pressure.  Labs are notable for WBC 19,500, normal troponin x 2, INR 2.0, potassium 2.8, and creatinine 1.03.   Patient underwent successful DCCV in the ED and was treated with oral and IV potassium, Zofran, fentanyl, and IV fluids.   Review of  Systems:  All other systems reviewed and apart from HPI, are negative.     Hospital Course:   Brief Narrative:  62 y.o. 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 age 79, aortic valve and arch repair in 2016, atrial fibrillation on warfarin and status post MAZE and left atrial appendage occlusion, anxiety, COPD, and chronic hypoxic respiratory failure admitted on 06/23/22 with A-fib with RVR status post cardioversion on 06/23/2022       -Assessment and Plan: 1)Persistent Afib with RVR--status post cardioversion on 06/23/2022 -Paroxysmal atrial fibrillation s/p DCCV in 2021 and 06/23/2022, s/p maze procedure and LAAO surgical occlusion in 2016 at [place] -Per cardiologist okay to continue metoprolol and propafenone at this time -Patient does Not want to use amiodarone -TEE in 2021 showed residual left atrial appendage with velocities 20 cm/s.  -Continue Coumadin with INR goal between 2 and 3  06/27/22 -Treated with IV heparin bridge  (had Lymph Node biopsy on 06/25/22) especially given recent cardioversion on 06/23/2022 -INR trended down currently down to 1.3, -Potassium replaced and normalized,  magnesium WNL -Cardiology recommends outpatient follow-up with EP outpatient to switch propafenone to Tikosyn.  - increase metoprolol to 25 mg daily -Discharged on Lovenox bridge until INR >-2   2)4.7 cm Tubular ascending aorta aneurysm with associated bicuspid aortic valve stenosis s/p Wheat procedure (mechanical) with hemiarch # s/p SAVR, On-X mechanical valve -Continue Coumadin (Lovenox as above) and aspirin 81 mg once daily -Lifelong antibiotic prophylaxis prior to any invasive procedures   3) Coarctation of aorta s/p repair at the age of 89 with residual new CoA -CT surgery did not feel like rotation of aorta is severe enough to fix it during the surgery in 2016. The pressure gradient across the descending aorta was 47 mmHg.    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 Delton Coombes  -s/p Lymph Node biopsy by general surgeon Dr. Constance Haw on 06/25/22 -Pathology still pending - 5) anxiety disorder--- continue benzos and Zoloft, consider Ambien for sleep   6) chronic hypoxic respiratory failure secondary to COPD-- -appears stable --at home patient uses up to 3 L of oxygen via nasal cannula =-Currently requiring only 2 L per nasal cannula -Continue bronchodilators  7) chronic anemia----stable, hemoglobin currently greater than 9 -Longstanding history of anemia, hemoglobin was 9.9 on 07/20/2021 and hemoglobin was 9.6 on 11/09/2021   Disposition: The patient is from: Home              Anticipated d/c is to: Home  Discharge Condition: -stable  Follow UP   Follow-up Information  Shirley Friar, PA-C Follow up on 07/10/2022.   Specialty: Cardiology Why: at 1200 -  please arrive 15 min early to check in, this is Dr. Forde Dandy PA Contact information: [place] [address] [phone]         Josue Hector, MD Follow up on 07/24/2022.   Specialty: Cardiology Why: at 1045 AM please arrive 15 min early to check in Contact information: [address] [place] [address] [phone]         Virl Cagey, MD. Call.   Specialty: General Surgery Why: Lymph Node Biopsy results Contact information: [address] [facility] [address] [phone]                 Consults obtained -General surgery/cardiology/oncology  Diet and Activity recommendation:  As advised  Discharge Instructions    Discharge Instructions     Call MD for:  difficulty breathing, headache or visual disturbances   Complete by: As directed    Call MD for:  persistant dizziness or light-headedness   Complete by: As directed    Call MD for:  persistant nausea and vomiting   Complete by: As directed    Call MD for:  temperature >100.4    Complete by: As directed    Diet - low sodium heart healthy   Complete by: As directed    Discharge instructions   Complete by: As directed    1)You Received Coumadin 7.5 mg x 1 dose at the hospital on 06/27/22, next dose of Coumadin will be 6 mg on Sunday 06/28/22....then back to your usual schedule of 3 mg daily (starting Monday 06/29/22) and '4mg'$  on Fridays  2)Repeat PT/INR on Monday 06/29/22 or Tuesday 06/30/22--- this should be done through the Coumadin clinic  3)Follow up with electrophysiology cardiology Dr. Crissie Sickles and your general cardiologist Dr. Earney Navy discuss possibly switching from Rythmol to [place]  4)You need oxygen at home at 2 L via nasal cannula continuously while awake and while asleep--- smoking or having open fires around oxygen can cause fire, significant injury and death  5)Inject under the skin Lovenox/enoxaparin 40 mg twice daily (10 am and 10 PM) until your INR is greater than 2.0. -No further Lovenox/enoxaparin injections are needed once your INR is  greater than 2.0  6)Follow up with general surgeon Dr. Blake Divine next week for results of your lymph node biopsy   Increase activity slowly   Complete by: As directed        Discharge Medications     Allergies as of 06/27/2022       Reactions   Amiodarone Nausea Only   Penicillins Hives   Has patient had a PCN reaction causing immediate rash, facial/tongue/throat swelling, SOB or lightheadedness with hypotension: YES Has patient had a PCN reaction causing severe rash involving mucus membranes or skin necrosis: NO Has patient had a PCN reaction that required hospitalization NO Has patient had a PCN reaction occurring within the last 10 years: NO If all of the above answers are "NO", then may proceed with Cephalosporin use.        Medication List     TAKE these medications    acetaminophen 325 MG tablet Commonly known as: TYLENOL Take 2 tablets (650 mg total) by mouth every 6 (six)  hours as needed for mild pain (or Fever >/= 101).   aspirin EC 81 MG tablet Take 1 tablet (81 mg total) by mouth daily with breakfast. What  changed: when to take this   Breztri Aerosphere 160-9-4.8 MCG/ACT Aero Generic drug: Budeson-Glycopyrrol-Formoterol Inhale 2 puffs into the lungs 2 (two) times daily.   calcium-vitamin D 500-5 MG-MCG tablet Commonly known as: OSCAL WITH D Take 1 tablet by mouth daily.   diazepam 10 MG tablet Commonly known as: VALIUM Take 1 tablet (10 mg total) by mouth daily as needed for sleep.   enoxaparin 40 MG/0.4ML injection Commonly known as: LOVENOX Inject 0.4 mLs (40 mg total) into the skin every 12 (twelve) hours for 5 days. Stop Lovenox when INR gets to 2.0   feeding supplement Liqd Take 237 mLs by mouth 2 (two) times daily between meals.   furosemide 40 MG tablet Commonly known as: LASIX Take 2 tablets (80 mg total) by mouth daily.   gabapentin 300 MG capsule Commonly known as: NEURONTIN Take 300-600 mg by mouth See admin instructions. Take 300 mg in the morning, 300 mg in the afternoon, and 600 mg at bedtime   HYDROcodone-acetaminophen 5-325 MG tablet Commonly known as: NORCO/VICODIN Take 2 tablets by mouth 2 (two) times daily.   ipratropium-albuterol 0.5-2.5 (3) MG/3ML Soln Commonly known as: DUONEB Inhale 3 mLs into the lungs 2 (two) times daily as needed (shortness of breath).   magnesium oxide 400 (240 Mg) MG tablet Commonly known as: MagOx 400 Take 1 tablet (400 mg total) by mouth daily.   metoprolol succinate 25 MG 24 hr tablet Commonly known as: TOPROL-XL Take 1 tablet (25 mg total) by mouth daily. What changed: how much to take   Multi-Vitamins Tabs Take 1 tablet by mouth daily.   ondansetron 4 MG disintegrating tablet Commonly known as: Zofran ODT Take 1 tablet (4 mg total) by mouth every 8 (eight) hours as needed for nausea or vomiting.   pantoprazole 40 MG tablet Commonly known as: PROTONIX Take 1 tablet (40 mg  total) by mouth daily. Start taking on: June 28, 2022   potassium chloride SA 20 MEQ tablet Commonly known as: KLOR-CON M Take 1 tablet (20 mEq total) by mouth 2 (two) times daily for 7 days, THEN 1 tablet (20 mEq total) daily for 7 days. Take 3 tablets daily when taking lasix. Start taking on: April 30, 2022   predniSONE 20 MG tablet Commonly known as: DELTASONE Take 1 tablet (20 mg total) by mouth daily with breakfast.   propafenone 225 MG 12 hr capsule Commonly known as: Rythmol SR Take 1 capsule (225 mg total) by mouth every 8 (eight) hours.   sertraline 100 MG tablet Commonly known as: ZOLOFT Take 100 mg by mouth See admin instructions. Take 1 & 1/2 tablets by mouth once daily   Turmeric 500 MG Caps Take 500 mg by mouth 3 (three) times a week.   Ventolin HFA 108 (90 Base) MCG/ACT inhaler Generic drug: albuterol Inhale 2 puffs into the lungs every 4 (four) hours as needed for wheezing or shortness of breath. What changed: when to take this   warfarin 1 MG tablet Commonly known as: COUMADIN Take as directed. If you are unsure how to take this medication, talk to your nurse or doctor. Original instructions: Take 3 tablets daily except 4 tablets on Fridays or as directed by coumadin clinic What changed:  how much to take how to take this when to take this   zolpidem 10 MG tablet Commonly known as: AMBIEN Take 1 tablet (10 mg total) by mouth at bedtime as needed for sleep.       Major procedures and  Radiology Reports - PLEASE review detailed and final reports for all details, in brief -   DG Chest 2 View  Result Date: 06/23/2022 CLINICAL DATA:  Chest pain and shortness of breath EXAM: CHEST - 2 VIEW COMPARISON:  02/03/2022 FINDINGS: Cardiac shadow is within normal limits. Postsurgical changes are again seen and stable. Lungs are well aerated bilaterally. No focal infiltrate or effusion is noted. Previously seen nipple shadows are less well appreciated on today's  exam. No bony abnormality is noted. IMPRESSION: No acute abnormality noted. Electronically Signed   By: Inez Catalina M.D.   On: 06/23/2022 18:47   NM PET Image Initial (PI) Skull Base To Thigh  Result Date: 06/18/2022 CLINICAL DATA:  Initial treatment strategy for lymphadenopathy, concern for lymphoma. EXAM: NUCLEAR MEDICINE PET SKULL BASE TO THIGH TECHNIQUE: 5.28 mCi F-18 FDG was injected intravenously. Full-ring PET imaging was performed from the skull base to thigh after the radiotracer. CT data was obtained and used for attenuation correction and anatomic localization. Fasting blood glucose: 91 mg/dl COMPARISON:  January 06/02/2022 FINDINGS: Mediastinal blood pool activity: SUV max 1.31 Liver activity: SUV max 1.89 NECK: Adenopathy at the thoracic inlet, see below. Incidental CT findings: None. CHEST: Bilateral thoracic inlet adenopathy. SUV on the LEFT of lymph nodes measuring up to 12 mm 2.84. (Image 63/3) AP window lymph node at approximately 12 mm with a maximum SUV of 3.60. (Image 86/3) multiple lymph nodes in the retropectoral region bilaterally at or less than a cm. RIGHT paratracheal lymph nodes with similar appearance including a high RIGHT paratracheal lymph node on image 64/3 also displaying low level FDG uptake and a 9 mm short axis dimension. No focal parenchymal FDG uptake in the lungs. Incidental CT findings: Post aortic valve replacement and ascending thoracic aortic repair. Persistent LEFT SVC. Moderate cardiomegaly without effusion. Airways are patent. Lungs are clear without signs of consolidative change. ABDOMEN/PELVIS: Similar pattern of nodal enlargement in the abdomen and in the pelvis. Lymph nodes display similar low-to-moderate level uptake of FDG. Most pronounced adenopathy in the LEFT retroperitoneum largest 15 mm as a discrete lymph node on image 160/3 showing a maximum SUV of 3.89. Similar FDG uptake throughout all retroperitoneal and upper abdominal lymph nodes. Lymph nodes seen  in the gastrohepatic ligament on image 145/3 measuring 11 mm. Intra-aortocaval nodal enlargement (image 171/3 with a levin mm lymph nodes. LEFT common iliac lymph node at 10 mm also with mild-to-moderate FDG uptake maximum SUV of 2.90. LEFT pelvic sidewall lymph node (image 220/3 10 mm short axis with similar FDG avidity. Lymph nodes in the bilateral groin slightly greater than or just less than a cm size with similar FDG uptake. No focal area of solid organ activity. Spleen top normal size at 12 cm with splenic FDG uptake slightly less than liver. Incidental CT findings: Liver density up to 65 Hounsfield units. Mildly lobular hepatic contours. Gallbladder not visualized and potentially surgically absent. No acute findings grossly relative to pancreas, spleen, adrenal glands or kidneys. Adrenal glands is cured by numerous retroperitoneal lymph nodes. No perinephric stranding or signs of hydronephrosis. Urinary bladder is collapsed limiting assessment. No acute gastrointestinal process. Unremarkable appearance of reproductive structures. SKELETON: Low level diffuse FDG activity within marrow spaces throughout the spine and pelvis. No focal area of increased metabolic activity. 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 Compression fracture at the L2 level with chronic appearance similar to chest radiograph of October 08, 2014 in terms of mild  kyphotic angulation of the spine at this level. IMPRESSION: 1. Generalized adenopathy perhaps slightly greatest in the abdomen in the retroperitoneum but seen throughout the low neck, chest, abdomen and pelvis. Findings are most suspicious for lymphoproliferative disorder showing mild-to-moderate FDG uptake. FDG uptake is greater than blood pool and liver. 2. Spleen top normal size with low level FDG uptake less than liver activity. 3. Low level diffuse FDG activity within marrow spaces throughout the spine and pelvis.  No focal area of increased metabolic activity. In the absence of marrow stimulation findings are concerning for marrow space involvement. 4. Post aortic valve replacement and ascending thoracic aortic repair. 5. Hepatic steatosis. 6. 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 Increased density of the liver can be seen in the setting of amiodarone deposition. Correlate with any clinical or laboratory evidence of liver disease. Electronically Signed   By: Zetta Bills M.D.   On: 06/18/2022 17:16   CT Chest High Resolution  Result Date: 06/03/2022 CLINICAL DATA:  Interstitial lung disease, chronic hypoxic respiratory failure * Tracking Code: BO * EXAM: CT CHEST WITHOUT CONTRAST TECHNIQUE: Multidetector CT imaging of the chest was performed following the standard protocol without intravenous contrast. High resolution imaging of the lungs, as well as inspiratory and expiratory imaging, was performed. RADIATION DOSE REDUCTION: This exam was performed according to the departmental dose-optimization program which includes automated exposure control, adjustment of the mA and/or kV according to patient size and/or use of iterative reconstruction technique. COMPARISON:  CT chest angiogram, 08/22/2014 FINDINGS: Cardiovascular: Aortic atherosclerosis. 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 Cardiomegaly. Enlargement of the main pulmonary artery measuring up to 3.5 cm in caliber. No pericardial effusion. 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 Mediastinum/Nodes: Numerous newly enlarged bilateral axillary, subpectoral, lower cervical, mediastinal, hilar, and most likely upper abdominal lymph nodes. Index pretracheal node measures 1.4 x 1.2 cm (series 2, image 51). Thyroid gland, trachea, and esophagus demonstrate no significant findings. Lungs/Pleura: No evidence  of fibrotic interstitial lung disease. Background of very fine pulmonary nodules, most concentrated in the lung apices. Mild, lobular air trapping on expiratory phase imaging. Trace right pleural effusion. Upper Abdomen: No acute abnormality.  Partially imaged splenomegaly. Musculoskeletal: No chest wall abnormality. No acute osseous findings. IMPRESSION: 1. No evidence of fibrotic interstitial lung disease. 2. Background of very fine pulmonary nodules, most concentrated in the lung apices. Findings are most commonly seen in smoking-related respiratory bronchiolitis although generally nonspecific and infectious or inflammatory. 3. Mild lobular air trapping on expiratory phase imaging, suggesting small airways disease. 4. Trace right pleural effusion. 5. Numerous newly enlarged bilateral axillary, subpectoral, lower cervical, mediastinal, hilar, and most likely upper abdominal lymph nodes. In conjunction with partially imaged splenomegaly, findings are concerning for lymphoma. 6. Cardiomegaly. Enlargement of the main pulmonary artery, as can be seen in pulmonary hypertension. These results will be called to the ordering clinician or representative by the Radiologist Assistant, and communication documented in the PACS or [employer]. Aortic Atherosclerosis (ICD10-I70.0). Electronically Signed   By: Delanna Ahmadi M.D.   On: 06/03/2022 11:37    Micro Results   Recent Results (from the past 240 hour(s))  Resp panel by RT-PCR (RSV, Flu A&B, Covid) Anterior Nasal Swab     Status: None   Collection Time: 06/23/22  7:25 PM   Specimen: Anterior Nasal Swab  Result Value Ref Range Status  SARS Coronavirus 2 by RT PCR NEGATIVE NEGATIVE Final    Comment: (NOTE) SARS-CoV-2 target nucleic acids are NOT DETECTED.  The SARS-CoV-2 RNA is generally detectable in upper respiratory specimens during the acute phase of infection. The lowest concentration of SARS-CoV-2 viral copies this assay can detect is 138  copies/mL. A negative result does not preclude SARS-Cov-2 infection and should not be used as the sole basis for treatment or other patient management decisions. A negative result may occur with  improper specimen collection/handling, submission of specimen other than nasopharyngeal swab, presence of viral mutation(s) within the areas targeted by this assay, and inadequate number of viral copies(<138 copies/mL). A negative result must be combined with clinical observations, patient history, and epidemiological information. The expected result is Negative.  Fact Sheet for Patients:  EntrepreneurPulse.com.au  Fact Sheet for Healthcare Providers:  IncredibleEmployment.be  This test is no t yet approved or cleared by the [place] FDA and  has been authorized for detection and/or diagnosis of SARS-CoV-2 by FDA under an Emergency Use Authorization (EUA). This EUA will remain  in effect (meaning this test can be used) for the duration of the COVID-19 declaration under Section 564(b)(1) of the Act, 21 U.S.C.section 360bbb-3(b)(1), unless the authorization is terminated  or revoked sooner.       Influenza A by PCR NEGATIVE NEGATIVE Final   Influenza B by PCR NEGATIVE NEGATIVE Final    Comment: (NOTE) The Xpert Xpress SARS-CoV-2/FLU/RSV plus assay is intended as an aid in the diagnosis of influenza from Nasopharyngeal swab specimens and should not be used as a sole basis for treatment. Nasal washings and aspirates are unacceptable for Xpert Xpress SARS-CoV-2/FLU/RSV testing.  Fact Sheet for Patients: EntrepreneurPulse.com.au  Fact Sheet for Healthcare Providers: IncredibleEmployment.be  This test is not yet approved or cleared by the [place] FDA and has been authorized for detection and/or diagnosis of SARS-CoV-2 by FDA under an Emergency Use Authorization (EUA). This EUA will remain in effect (meaning  this test can be used) for the duration of the COVID-19 declaration under Section 564(b)(1) of the Act, 21 U.S.C. section 360bbb-3(b)(1), unless the authorization is terminated or revoked.     Resp Syncytial Virus by PCR NEGATIVE NEGATIVE Final    Comment: (NOTE) Fact Sheet for Patients: EntrepreneurPulse.com.au  Fact Sheet for Healthcare Providers: IncredibleEmployment.be  This test is not yet approved or cleared by the [place] FDA and has been authorized for detection and/or diagnosis of SARS-CoV-2 by FDA under an Emergency Use Authorization (EUA). This EUA will remain in effect (meaning this test can be used) for the duration of the COVID-19 declaration under Section 564(b)(1) of the Act, 21 U.S.C. section 360bbb-3(b)(1), unless the authorization is terminated or revoked.  Performed at [facility], [address], [place], [place] [address]   MRSA Next Gen by PCR, Nasal     Status: None   Collection Time: 06/24/22  1:43 AM   Specimen: Nasal Mucosa; Nasal Swab  Result Value Ref Range Status   MRSA by PCR Next Gen NOT DETECTED NOT DETECTED Final    Comment: (NOTE) The GeneXpert MRSA Assay (FDA approved for NASAL specimens only), is one component of a comprehensive MRSA colonization surveillance program. It is not intended to diagnose MRSA infection nor to guide or monitor treatment for MRSA infections. Test performance is not FDA approved in patients less than 75 years old. Performed at [facility], [address] [place], [place] [address]    Today   Subjective  Magalene Deakins today has no new complaints  - O2 sat 97 to 100% on 2 L/min No fever  Or chills   No Nausea, Vomiting or Diarrhea -- No further chest pains no palpitations no dizziness, no increased shortness of breath   Patient has been seen and examined prior to discharge   Objective   Blood pressure (!) 159/77, pulse 80, temperature 98.1 F (36.7 C),  temperature source Oral, resp. rate 18, height '5\' 4"'$  (1.626 m), weight 51.9 kg, last menstrual period 07/16/2013, SpO2 100 %.   Intake/Output Summary (Last 24 hours) at 06/27/2022 1247 Last data filed at 06/27/2022 0038 Gross per 24 hour  Intake 319.19 ml  Output --  Net 319.19 ml    Exam Gen:- Awake Alert, in no acute distress HEENT:- [place].AT, No sclera icterus Nose-  2L/min Neck-Supple Neck,No JVD,.  Lungs-  fair symmetrical air movement, no wheezing CV- S1, S2 normal, regular , prior Sternotomy Abd-  +ve B.Sounds, Abd Soft, No tenderness,    Extremity/Skin:- No  edema, pedal pulses present  Psych-affect is appropriate, oriented x3 Neuro-no new focal deficits, no tremors MSK-right groin biopsy site is hemostatic ??  Right inguinal hernia-reducible   Data Review   CBC w Diff:  Lab Results  Component Value Date   WBC 15.7 (H) 06/27/2022   HGB 9.6 (L) 06/27/2022   HGB 14.4 07/05/2019   HCT 31.7 (L) 06/27/2022   HCT 44.2 07/05/2019   PLT 299 06/27/2022   PLT 479 (H) 07/05/2019   LYMPHOPCT 17 06/23/2022   MONOPCT 4 06/23/2022   EOSPCT 0 06/23/2022   BASOPCT 0 06/23/2022    CMP:  Lab Results  Component Value Date   NA 137 06/26/2022   NA 141 07/05/2019   K 3.9 06/26/2022   CL 104 06/26/2022   CO2 26 06/26/2022   BUN 11 06/26/2022   BUN 13 07/05/2019   CREATININE 0.69 06/26/2022   CREATININE 0.78 03/18/2022   PROT 7.2 06/23/2022   ALBUMIN 4.1 06/23/2022   BILITOT 0.8 06/23/2022   ALKPHOS 86 06/23/2022   AST 35 06/23/2022   ALT 27 06/23/2022  .  Total Discharge time is about 33 minutes  Roxan Hockey M.D on 06/27/2022 at 12:47 PM  Go to [email or web address] -  for contact info  [facility] - Office  [phone]

## 2022-06-29 ENCOUNTER — Telehealth: Payer: Self-pay | Admitting: Internal Medicine

## 2022-06-29 ENCOUNTER — Ambulatory Visit: Payer: Medicaid Other | Attending: Cardiovascular Disease | Admitting: Cardiovascular Disease

## 2022-06-29 ENCOUNTER — Encounter: Payer: Self-pay | Admitting: Cardiovascular Disease

## 2022-06-29 VITALS — BP 120/76 | HR 150 | Ht 64.0 in | Wt 109.4 lb

## 2022-06-29 DIAGNOSIS — Q231 Congenital insufficiency of aortic valve: Secondary | ICD-10-CM

## 2022-06-29 DIAGNOSIS — I4892 Unspecified atrial flutter: Secondary | ICD-10-CM | POA: Diagnosis not present

## 2022-06-29 DIAGNOSIS — Z7901 Long term (current) use of anticoagulants: Secondary | ICD-10-CM

## 2022-06-29 DIAGNOSIS — Z8774 Personal history of (corrected) congenital malformations of heart and circulatory system: Secondary | ICD-10-CM

## 2022-06-29 DIAGNOSIS — Z952 Presence of prosthetic heart valve: Secondary | ICD-10-CM

## 2022-06-29 DIAGNOSIS — I776 Arteritis, unspecified: Secondary | ICD-10-CM

## 2022-06-29 DIAGNOSIS — Z5181 Encounter for therapeutic drug level monitoring: Secondary | ICD-10-CM

## 2022-06-29 DIAGNOSIS — Z9889 Other specified postprocedural states: Secondary | ICD-10-CM

## 2022-06-29 LAB — SURGICAL PATHOLOGY

## 2022-06-29 MED ORDER — PROPAFENONE HCL ER 325 MG PO CP12: 325.0000 mg | ORAL_CAPSULE | Freq: Two times a day (BID) | ORAL | 11 refills | Status: DC

## 2022-06-29 MED ORDER — METOPROLOL SUCCINATE ER 50 MG PO TB24: 50.0000 mg | ORAL_TABLET | Freq: Every day | ORAL | 11 refills | Status: DC

## 2022-06-29 MED ORDER — SERTRALINE HCL 100 MG PO TABS: 100.0000 mg | ORAL_TABLET | Freq: Every day | ORAL | 3 refills | Status: DC

## 2022-06-29 NOTE — Patient Instructions (Signed)
Medication Instructions:   Increase Rythmol to 325 Two Times Daily  Increase Toprol XL to 50 mg Daily  Decrease Zoloft to 100 mg Daily   *If you need a refill on your cardiac medications before your next appointment, please call your pharmacy*   Lab Work: NONE   If you have labs (blood work) drawn today and your tests are completely normal, you will receive your results only by: [place] (if you have MyChart) OR A paper copy in the mail If you have any lab test that is abnormal or we need to change your treatment, we will call you to review the results.   Testing/Procedures: NONE    Follow-Up: At [facility], you and your health needs are our priority.  As part of our continuing mission to provide you with exceptional heart care, we have created designated Provider Care Teams.  These Care Teams include your primary Cardiologist (physician) and Advanced Practice Providers (APPs -  Physician Assistants and Nurse Practitioners) who all work together to provide you with the care you need, when you need it.  We recommend signing up for the patient portal called "MyChart".  Sign up information is provided on this After Visit Summary.  MyChart is used to connect with patients for Virtual Visits (Telemedicine).  Patients are able to view lab/test results, encounter notes, upcoming appointments, etc.  Non-urgent messages can be sent to your provider as well.   To learn more about what you can do with MyChart, go to [email or web address].    Your next appointment:   3 month(s)  Provider:   You may see Jenkins Rouge, MD or one of the following Advanced Practice Providers on your designated Care Team:   Bernerd Pho, PA-C  Ermalinda Barrios, PA-C     Other Instructions Thank you for choosing [facility]!

## 2022-06-29 NOTE — Progress Notes (Signed)
Patient ID: Mallory Sutton, female   DOB: 1960-10-27, 62 y.o.   MRN: [id number]      61 y.o. long term patient followed since 1999. She has had congenital heart disease with coarctation repair at age 72 via left thoracotomy. Known persistent left sided SVC  Followed for long time for bicuspid AV and aortic aneurysm Finally had AVR and arch repair by [name] in 2016. I was upset that he did not bypass her coarctation at the time She had PAF and MAZE with LAA occlusion. She has been stable on propafenone for years and followed by Dr Lovena Le in EP  She developed a neuropathy and painful ? Vascular rash in her legs 2019 with short course of steroids.   Recent US RLE with complex cystic lesion in posterior upper left thigh   TEE done 08/29/19 showed normal EF she had no AR and mean gradient across her AVR 14 mmHg. She has not had recent MRI/CT imaging of her coarctation but the MLD had been < 1 cm in past     Her care has been compromised in past due to lack of insurance and financial issues She has been sporadic at times with her INR checks being subRx the last month   Vasculitis does not seem well Rx Still with lots of discoloration and some pain in legs Also developing more cysts She needs to have on removed by Dr Constance Haw from back of right thigh as it is pressing on nerve Also has one above right knee with lateral knee effusion Had steroid injection right knee 11/01/20 Seen by rheumatology DR Benjamine Mola 12/31/20  Labs including ESR, ANCA Cryoglobulin and cardiolipin negative   TTE:  07/20/21  EF 60-65% Normal RV mild MR AVR On-X 19 mm mean gradient 29 mmHg trivial PVL DVI 0.44  CXR 07/16/21 Emphysema NAD   BNP 07/20/21 437 BUN/Cr BUN 25 Cr 1.19 K 3.8  Hct 32.5  INR 2.5   Hospitalized 3/15-21/23 for hypoxemic respiratory failure COPD She is a non smoker Wood stove for heat D/c on 2L's oxygen Symbicort and Spiriva Plan to do walk test and consider nocturnal oxymetry She was in NSR throughout and d/c with lasix  80 mg daily   08/15/21 emotionally a wreck She feels helpless with another medical issue and being tied to oxygen. Her husband Mikki Santee is being ugly to her in regard to living his life in the shadow of all her medical problems and he is getting too old to do all his landscaping work and finances have always been tight. I spoke to her daughter Raquel Sarna at that time and son lives with them   Seen by Dr Erin Fulling 10/06/21 PFTls with severe obstruction and moderate diffusion defect Still on 2 L's oxygen   Continues to struggle with vasculitis/pain in arms/legs and dyspnea with her lung dx DLCO is < 50% and FEV1 < 1 Now using 3L' [place]  Recent ANCA negative as was Histone Ab and ANA   She is emotional again today PAF at night better and likely related to anxiety and hypoxia. Has lost 20 lbs since April Frustrated with lack of diagnosis with lungs   Despite increasing her rhythmol she has had more PAF Had [facility] in [place] ER on 06/27/22   Had a lymph node biopsy in groin after PET /CT suggested lymphoma. Surgical path is pending. She was on Zoloft and cannot start Tikosyn until this is weaned off due to QT interactions She needs to f/u with EP  ECG today with afib/flutter rate 150   She still has not found out results of biopsy. She is on lovenox and has appointment with Edrick Oh this week to check INR. She will see primary Dr Juel Burrow office to discuss weaning Zoloft. For now I told her to cute back from 1.5 tablets/day to 1.  May be ok to start Paxil as this has the lowest risk of QT prolongation of SSRI's She also needs script for ativan/xanax given her anxiety and breathing issues. She has been on valium 5 mg bid .  In regard to her PAF will increase her rhythmol and Toprol    ROS: Denies fever, malais, weight loss, blurry vision, decreased visual acuity, cough, sputum, SOB, hemoptysis, pleuritic pain, palpitaitons, heartburn, abdominal pain, melena, lower extremity edema, claudication, or rash.  All other systems reviewed  and negative  General: Vitals:   06/29/22 1421  BP: 120/76  Pulse: (!) 150  SpO2: 99%     BP 120/76   Pulse (!) 150   Ht '5\' 4"'$  (1.626 m)   Wt 109 lb 6.4 oz (49.6 kg)   LMP 07/16/2013   SpO2 99% Comment: 2 liters of oxygen  BMI 18.78 kg/m    Affect appropriate Healthy:  appears stated age 51: normal Neck supple with no adenopathy JVP normal no bruits no thyromegaly Lungs clear with no wheezing and good diaphragmatic motion Heart:  [id number] click SEM through AVR  No AR  murmur, no rub, gallop or click PMI normal Post left thoracotomy and sternotomy. Coarctation murmur radiates to back  Abdomen: benighn, BS positve, no tenderness, no AAA no bruit.  No HSM or HJR post biopsy right groin healing  Distal pulses intact with no bruits Post surgery age 73 left knee and bilateral knee swelling  Neuro non-focal Skin vasculitic rash arms and legs  Cystic mass back of right thigh With right knee effusion and cyst above knee     Current Outpatient Medications  Medication Sig Dispense Refill   acetaminophen (TYLENOL) 325 MG tablet Take 2 tablets (650 mg total) by mouth every 6 (six) hours as needed for mild pain (or Fever >/= 101). 100 tablet 1   albuterol (VENTOLIN HFA) 108 (90 Base) MCG/ACT inhaler Inhale 2 puffs into the lungs every 4 (four) hours as needed for wheezing or shortness of breath. 54 g 6   aspirin EC 81 MG tablet Take 1 tablet (81 mg total) by mouth daily with breakfast. 30 tablet 11   Budeson-Glycopyrrol-Formoterol (BREZTRI AEROSPHERE) 160-9-4.8 MCG/ACT AERO Inhale 2 puffs into the lungs 2 (two) times daily. 10.7 g 1   calcium-vitamin D (OSCAL WITH D) 500-5 MG-MCG tablet Take 1 tablet by mouth daily. 30 tablet 2   diazepam (VALIUM) 10 MG tablet Take 1 tablet (10 mg total) by mouth daily as needed for sleep. 15 tablet 0   enoxaparin (LOVENOX) 40 MG/0.4ML injection Inject 0.4 mLs (40 mg total) into the skin every 12 (twelve) hours for 5 days. Stop Lovenox when INR gets  to 2.0 4 mL 0   feeding supplement (ENSURE ENLIVE / ENSURE PLUS) LIQD Take 237 mLs by mouth 2 (two) times daily between meals.     furosemide (LASIX) 40 MG tablet Take 2 tablets (80 mg total) by mouth daily. 30 tablet 3   gabapentin (NEURONTIN) 300 MG capsule Take 300-600 mg by mouth See admin instructions. Take 300 mg in the morning, 300 mg in the afternoon, and 600 mg at bedtime     HYDROcodone-acetaminophen (  NORCO/VICODIN) 5-325 MG tablet Take 2 tablets by mouth 2 (two) times daily.     ipratropium-albuterol (DUONEB) 0.5-2.5 (3) MG/3ML SOLN Inhale 3 mLs into the lungs 2 (two) times daily as needed (shortness of breath). 360 mL 1   magnesium oxide (MAGOX 400) 400 (240 Mg) MG tablet Take 1 tablet (400 mg total) by mouth daily. 30 tablet 2   metoprolol succinate (TOPROL-XL) 25 MG 24 hr tablet Take 1 tablet (25 mg total) by mouth daily. 90 tablet 3   Multiple Vitamin (MULTI-VITAMINS) TABS Take 1 tablet by mouth daily.      ondansetron (ZOFRAN ODT) 4 MG disintegrating tablet Take 1 tablet (4 mg total) by mouth every 8 (eight) hours as needed for nausea or vomiting. 20 tablet 0   pantoprazole (PROTONIX) 40 MG tablet Take 1 tablet (40 mg total) by mouth daily. 30 tablet 2   potassium chloride SA (KLOR-CON M) 20 MEQ tablet Take 1 tablet (20 mEq total) by mouth 2 (two) times daily for 7 days, THEN 1 tablet (20 mEq total) daily for 7 days. Take 3 tablets daily when taking lasix. 90 tablet 3   predniSONE (DELTASONE) 20 MG tablet Take 1 tablet (20 mg total) by mouth daily with breakfast. 90 tablet 3   propafenone (RYTHMOL SR) 225 MG 12 hr capsule Take 1 capsule (225 mg total) by mouth every 8 (eight) hours. 270 capsule 3   sertraline (ZOLOFT) 100 MG tablet Take 100 mg by mouth See admin instructions. Take 1 & 1/2 tablets by mouth once daily     Turmeric 500 MG CAPS Take 500 mg by mouth 3 (three) times a week.     warfarin (COUMADIN) 1 MG tablet Take 3 tablets daily except 4 tablets on Fridays or as directed  by coumadin clinic 100 tablet 5   zolpidem (AMBIEN) 10 MG tablet Take 1 tablet (10 mg total) by mouth at bedtime as needed for sleep. 10 tablet 0   No current facility-administered medications for this visit.    Allergies  Amiodarone and Penicillins  Electrocardiogram:  Afib rate 150 QT normal   Assessment and Plan  PAF:  2016 post MAZE/LAA exclusion increased episodes of PAF/Flutter Rhythmol dose increased [facility] 06/27/22 Weaning Zoloft to consider changing AAT to Tikosyn Needs f/u with EP to arrange For now increase Rhythmol to 325 mg SR bid and increase Toprol to 50 mg daily Given her overall health and COPD doubt EP would consider ablation at this time AVR: Normal function on TTE 07/20/21    Coarctation:  Disagree with decision make to not repair coarctation at time of surgery for aneurysm and bicuspid valve at some point will need f/u MRI/MRA  HTN:  Well controlled.  Continue current medications and low sodium Dash type diet.   Aneursym:  In setting of bicuspid valve post  arch repair with graft.  Neuropathy/Rash:  Bilateral painful legs. F/U Rheumatology as her vasculitis may not be ideally Rx She only takes pulsed steroids. She is afraid to take MTX. Given worsening lung status will prescribe 20 mg of prednisone and have her f/u with Jari Favre and Loma Sousa to discuss further  COPD:  ? 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 Start Paxil in place of Zoloft Needs script for ativan.xanax from primary The Zoloft has worked very well for her and has " been a Therapist, sports" but she needs AAT and less chance of QT prolongation to initiate a drug like tikosyn   F/U with me in 3 months  [employer]

## 2022-06-29 NOTE — Telephone Encounter (Addendum)
Pt called to report that she resting and her heart is racing in the 140's. Pt does compline of SOB at this time. Pt complains of chest pain that started after her DCCV no increased chest pain noted. States that Toprol XL was increased to 25 mg daily. Encouraged to be seen in the ER. Pt states that she would like to hear Dr. Kyla Balzarine response first before going to the ER. Please advise.   Reviewed with Dr. [name]. Pt asked to come in today to be seen at 2:30 pm. Pt agrees with plan of care.

## 2022-06-29 NOTE — Telephone Encounter (Signed)
STAT if HR is under 50 or over 120 (normal HR is 60-100 beats per minute)  What is your heart rate?  141 about 5 minutes ago   Do you have a log of your heart rate readings (document readings)?  Ranging 130-141 this morning while still laying in bed  Do you have any other symptoms?  Palpitations/back in afib

## 2022-06-30 ENCOUNTER — Ambulatory Visit: Payer: Medicaid Other | Attending: Student | Admitting: Internal Medicine

## 2022-06-30 ENCOUNTER — Ambulatory Visit: Payer: Medicaid Other | Attending: Cardiology | Admitting: Pharmacist

## 2022-06-30 ENCOUNTER — Encounter: Payer: Self-pay | Admitting: Internal Medicine

## 2022-06-30 ENCOUNTER — Encounter (HOSPITAL_COMMUNITY): Payer: Medicaid Other

## 2022-06-30 VITALS — BP 108/52 | HR 136 | Ht 64.0 in | Wt 105.8 lb

## 2022-06-30 DIAGNOSIS — I4819 Other persistent atrial fibrillation: Secondary | ICD-10-CM

## 2022-06-30 DIAGNOSIS — Z5181 Encounter for therapeutic drug level monitoring: Secondary | ICD-10-CM

## 2022-06-30 DIAGNOSIS — I4891 Unspecified atrial fibrillation: Secondary | ICD-10-CM | POA: Diagnosis not present

## 2022-06-30 DIAGNOSIS — Q231 Congenital insufficiency of aortic valve: Secondary | ICD-10-CM

## 2022-06-30 DIAGNOSIS — Z952 Presence of prosthetic heart valve: Secondary | ICD-10-CM

## 2022-06-30 DIAGNOSIS — Q251 Coarctation of aorta: Secondary | ICD-10-CM

## 2022-06-30 LAB — POCT INR: INR: 3.7 — AB (ref 2.0–3.0)

## 2022-06-30 NOTE — Progress Notes (Signed)
HPI  Mallory Sutton returns today for followup. She is a pleasant 62 yo woman with a h/o coarct s/p repair, and bicuspid aortic valve replacement, pulmonary HTN, lung disease with a DLCO of 44%. She has been bothered by PAF with a RVR and when she is out of rhythm, her dyspnea worsens at least one class. She underwent CT scanning demonstrating multiple lung nodules and is pending a PET scan. She denies syncope. She has been treated for her atrial fib with rhythmol. Unfortunately she has had worsening atrial fib with a RVR. I have recommended she be brought in for initiation of dofetilide. Her QT is borderline and I have explained that she may not be able to tolerate but I think this is her best option. Her pulmonary HTN makes ablation (PVI) or AV node RFA/PPM both relatively contra-indicated. In addition, she will need to wean down from the zoloft. Her INR has not been therapeutic and she is taking lovenox.    Current Outpatient Medications  Medication Sig Dispense Refill   acetaminophen (TYLENOL) 325 MG tablet Take 2 tablets (650 mg total) by mouth every 6 (six) hours as needed for mild pain (or Fever >/= 101). 100 tablet 1   albuterol (VENTOLIN HFA) 108 (90 Base) MCG/ACT inhaler Inhale 2 puffs into the lungs every 4 (four) hours as needed for wheezing or shortness of breath. 54 g 6   aspirin EC 81 MG tablet Take 1 tablet (81 mg total) by mouth daily with breakfast. 30 tablet 11   Budeson-Glycopyrrol-Formoterol (BREZTRI AEROSPHERE) 160-9-4.8 MCG/ACT AERO Inhale 2 puffs into the lungs 2 (two) times daily. 10.7 g 1   calcium-vitamin D (OSCAL WITH D) 500-5 MG-MCG tablet Take 1 tablet by mouth daily. 30 tablet 2   diazepam (VALIUM) 10 MG tablet Take 1 tablet (10 mg total) by mouth daily as needed for sleep. 15 tablet 0   enoxaparin (LOVENOX) 40 MG/0.4ML injection Inject 0.4 mLs (40 mg total) into the skin every 12 (twelve) hours for 5 days. Stop Lovenox when INR gets to 2.0 4 mL 0   feeding  supplement (ENSURE ENLIVE / ENSURE PLUS) LIQD Take 237 mLs by mouth 2 (two) times daily between meals.     furosemide (LASIX) 40 MG tablet Take 2 tablets (80 mg total) by mouth daily. 30 tablet 3   gabapentin (NEURONTIN) 300 MG capsule Take 300-600 mg by mouth See admin instructions. Take 300 mg in the morning, 300 mg in the afternoon, and 600 mg at bedtime     HYDROcodone-acetaminophen (NORCO/VICODIN) 5-325 MG tablet Take 2 tablets by mouth 2 (two) times daily.     ipratropium-albuterol (DUONEB) 0.5-2.5 (3) MG/3ML SOLN Inhale 3 mLs into the lungs 2 (two) times daily as needed (shortness of breath). 360 mL 1   magnesium oxide (MAGOX 400) 400 (240 Mg) MG tablet Take 1 tablet (400 mg total) by mouth daily. 30 tablet 2   metoprolol succinate (TOPROL-XL) 50 MG 24 hr tablet Take 1 tablet (50 mg total) by mouth daily. Take with or immediately following a meal. 30 tablet 11   Multiple Vitamin (MULTI-VITAMINS) TABS Take 1 tablet by mouth daily.      ondansetron (ZOFRAN ODT) 4 MG disintegrating tablet Take 1 tablet (4 mg total) by mouth every 8 (eight) hours as needed for nausea or vomiting. 20 tablet 0   pantoprazole (PROTONIX) 40 MG tablet Take 1 tablet (40 mg total) by mouth daily. 30 tablet 2   potassium chloride SA (  KLOR-CON M) 20 MEQ tablet Take 1 tablet (20 mEq total) by mouth 2 (two) times daily for 7 days, THEN 1 tablet (20 mEq total) daily for 7 days. Take 3 tablets daily when taking lasix. 90 tablet 3   predniSONE (DELTASONE) 20 MG tablet Take 1 tablet (20 mg total) by mouth daily with breakfast. 90 tablet 3   propafenone (RYTHMOL SR) 325 MG 12 hr capsule Take 1 capsule (325 mg total) by mouth 2 (two) times daily. 60 capsule 11   sertraline (ZOLOFT) 100 MG tablet Take 1 tablet (100 mg total) by mouth daily. 30 tablet 3   Turmeric 500 MG CAPS Take 500 mg by mouth 3 (three) times a week.     warfarin (COUMADIN) 1 MG tablet Take 3 tablets daily except 4 tablets on Fridays or as directed by coumadin  clinic 100 tablet 5   zolpidem (AMBIEN) 10 MG tablet Take 1 tablet (10 mg total) by mouth at bedtime as needed for sleep. 10 tablet 0   No current facility-administered medications for this visit.     Past Medical History:  Diagnosis Date   Aortic stenosis    ATRIAL ARRHYTHMIAS    Bicuspid aortic valve    CHF (congestive heart failure) (HCC)    COARCTATION OF AORTA    ENDOMETRIOSIS    Heart murmur    Paroxysmal atrial fibrillation (HCC)    Shortness of breath    THORACIC AORTIC ANEURYSM    Vasculitis (HCC)     ROS:   All systems reviewed and negative except as noted in the HPI.   Past Surgical History:  Procedure Laterality Date   CARDIOVERSION N/A 08/29/2019   Procedure: CARDIOVERSION;  Surgeon: Arnoldo Lenis, MD;  Location: [facility];  Service: Endoscopy;  Laterality: N/A;   coarctation repair and residual restenosis     KNEE SURGERY     TEE WITHOUT CARDIOVERSION N/A 07/12/2019   Procedure: TRANSESOPHAGEAL ECHOCARDIOGRAM (TEE);  Surgeon: Sueanne Margarita, MD;  Location: [facility] ENDOSCOPY;  Service: Cardiovascular;  Laterality: N/A;   TEE WITHOUT CARDIOVERSION N/A 08/29/2019   Procedure: TRANSESOPHAGEAL ECHOCARDIOGRAM (TEE) WITH PROPOFOL;  Surgeon: Arnoldo Lenis, MD;  Location: [facility];  Service: Endoscopy;  Laterality: N/A;     Family History  Problem Relation Age of Onset   Suicidality Mother    Mental illness Mother    Drug abuse Brother    Healthy Daughter    Healthy Son      Social History   Socioeconomic History   Marital status: Married    Spouse name: Not on file   Number of children: Not on file   Years of education: Not on file   Highest education level: Not on file  Occupational History   Not on file  Tobacco Use   Smoking status: Never    Passive exposure: Past   Smokeless tobacco: Never  Vaping Use   Vaping Use: Never used  Substance and Sexual Activity   Alcohol use: Yes    Alcohol/week: 0.0 standard drinks of alcohol    Comment: 3  beers 3 nights a week   Drug use: No   Sexual activity: Never  Other Topics Concern   Not on file  Social History Narrative   Not on file   Social Determinants of Health   Financial Resource Strain: Not on file  Food Insecurity: Food Insecurity Present (06/24/2022)   Hunger Vital Sign    Worried About Running Out of Food in the Last Year:  Sometimes true    Ran Out of Food in the Last Year: Sometimes true  Transportation Needs: No Transportation Needs (06/24/2022)   PRAPARE - Hydrologist (Medical): No    Lack of Transportation (Non-Medical): No  Physical Activity: Not on file  Stress: Not on file  Social Connections: Not on file  Intimate Partner Violence: Not At Risk (06/24/2022)   Humiliation, Afraid, Rape, and Kick questionnaire    Fear of Current or Ex-Partner: No    Emotionally Abused: No    Physically Abused: No    Sexually Abused: No     BP (!) 108/52   Pulse (!) 136   Ht '5\' 4"'$  (1.626 m)   Wt 105 lb 12.8 oz (48 kg)   LMP 07/16/2013   SpO2 97%   BMI 18.16 kg/m   Physical Exam:  Well appearing NAD HEENT: Unremarkable Neck:  No JVD, no thyromegally Lymphatics:  No adenopathy Back:  No CVA tenderness Lungs:  scattered rales with no wheezes HEART:  IRegular tachy rhythm, no murmurs, no rubs, no clicks Abd:  soft, positive bowel sounds, no organomegally, no rebound, no guarding Ext:  2 plus pulses, no edema, no cyanosis, no clubbing Skin:  No rashes no nodules Neuro:  CN II through XII intact, motor grossly intact  Assess/Plan:  PAF - her symptoms may be a bit better controlled. She will continue Rhythmol. Her QT is a little long for dofetilide, but might be acceptable off of rhythmol. We discussed treatment options with the patient. I also discussed AV node ablation and PPM.  Chronic diastolic heart failure -she will continue her current meds. Coags - her INR has been subtherapeutic. She will continue coumadin and lovenox until her  INR is therapeutic.  Royston Sinner Daekwon Beswick,MD

## 2022-06-30 NOTE — Patient Instructions (Signed)
Medication Instructions:  Your physician recommends that you continue on your current medications as directed. Please refer to the Current Medication list given to you today.  *If you need a refill on your cardiac medications before your next appointment, please call your pharmacy*   Lab Work: Your physician recommends that you return for lab work to be done today,.   If you have labs (blood work) drawn today and your tests are completely normal, you will receive your results only by: [place] (if you have MyChart) OR A paper copy in the mail If you have any lab test that is abnormal or we need to change your treatment, we will call you to review the results.   Testing/Procedures: NONE     Follow-Up: At [facility], you and your health needs are our priority.  As part of our continuing mission to provide you with exceptional heart care, we have created designated Provider Care Teams.  These Care Teams include your primary Cardiologist (physician) and Advanced Practice Providers (APPs -  Physician Assistants and Nurse Practitioners) who all work together to provide you with the care you need, when you need it.  We recommend signing up for the patient portal called "MyChart".  Sign up information is provided on this After Visit Summary.  MyChart is used to connect with patients for Virtual Visits (Telemedicine).  Patients are able to view lab/test results, encounter notes, upcoming appointments, etc.  Non-urgent messages can be sent to your provider as well.   To learn more about what you can do with MyChart, go to [email or web address].    Your next appointment:    "To be determined   Provider:   Cristopher Peru, MD    Other Instructions Thank you for choosing [facility]!

## 2022-06-30 NOTE — Patient Instructions (Signed)
Description   Hold your warfarin today, then resume taking warfarin '3mg'$  daily except '4mg'$  on Fridays. Recheck INR weekly pending Tikosyn (dofetilide) admission. Continue Ensure 2 daily per MD

## 2022-07-01 ENCOUNTER — Telehealth: Payer: Self-pay

## 2022-07-01 NOTE — Telephone Encounter (Signed)
Message forward by RN / Dr [name] to consult Dr. Lovena Le on [place] admission through the [facility].    Orders per Dr. Lovena Le, Contact [facility] to make them aware of this PT / need to have a Tikosyn admission at hospital.   Pt stay on Lovenox until INR is therapeutic Pt should stop Rythmol on Friday 3/8.  Once INR therapeutic, Pt admit for Tikosyn admission on Monday 07/13/22.   This must be managed in 3 week time frame from 06/29/2022.    Pt called and stated she was told to stop Lovenox since her INR was 3.7.  Pt did not take Wednesday, 2/28 dose of Lovenox.  Pt states she was supposed to stop Zoloft with stopping Rythmol.  Pt states she will be taking xanax. The plan of care given by Dr. Lovena Le became extremely difficult to communicate to Pt, and Pt may not be able to start Tikosyn on 3/11, if she has not followed protocols, or other providers orders changed POC?  I will consult Dr. Lovena Le, and feel that [facility] clinic will need to communicate POC / orders to this patient for coumadin management, and Tikosyn start / admission.  Follow up required 07/02/2022.

## 2022-07-02 ENCOUNTER — Encounter (HOSPITAL_COMMUNITY): Payer: Self-pay | Admitting: General Surgery

## 2022-07-02 ENCOUNTER — Inpatient Hospital Stay: Payer: Medicaid Other | Admitting: Licensed Clinical Social Worker

## 2022-07-02 ENCOUNTER — Encounter: Payer: Self-pay | Admitting: Radiology

## 2022-07-02 DIAGNOSIS — R591 Generalized enlarged lymph nodes: Secondary | ICD-10-CM

## 2022-07-02 LAB — SURGICAL PATHOLOGY

## 2022-07-02 NOTE — Telephone Encounter (Signed)
Stacy RN [facility] clinic will explain orders to patient, and set up [place] admission

## 2022-07-02 NOTE — Progress Notes (Signed)
[facility] Work  Initial Assessment   Mallory Sutton is a 61 y.o. year old female contacted by phone. Clinical Social Work was referred by medical provider for assessment of psychosocial needs.   SDOH (Social Determinants of Health) assessments performed: Yes SDOH Interventions    Flowsheet Row Clinical Support from 07/02/2022 in [place] at [facility] ED to Hosp-Admission (Discharged) from 06/23/2022 in [place] Interventions    Food Insecurity Interventions -- Inpatient [facility]  Financial Strain Interventions Financial Counselor --       SDOH Screenings   Food Insecurity: Food Insecurity Present (06/24/2022)  Housing: Low Risk  (06/24/2022)  Transportation Needs: No Transportation Needs (06/24/2022)  Utilities: Not At Risk (06/24/2022)  Recent Concern: Utilities - At Risk (06/23/2022)  Depression (PHQ2-9): Medium Risk (06/23/2022)  Financial Resource Strain: High Risk (07/02/2022)  Tobacco Use: Low Risk  (06/30/2022)     Distress Screen completed: No     No data to display            Family/Social Information:  Housing Arrangement: patient lives with spouse and son.   Pt has a complex medical history and uses oxygen at baseline. Family members/support persons in your life? Pt's daughter and son-in-law live relatively close as well and provide support as needed. Transportation concerns: no  Employment: Unemployed .   Pt's husband works in Biomedical scientist. Income source: Pt's husband's income is their sole source of income.  Pt's daughter assists as able. Financial concerns: Yes, current concerns Type of concern: Utilities and IRS monthly payment Food access concerns: yes Religious or spiritual practice: Yes-[employer] [facility] Currently in place:  none  Coping/ Adjustment to diagnosis: Patient understands treatment plan and what happens next? Pt is awaiting biopsy results, treatment plan not yet determined. Concerns about diagnosis  and/or treatment: Overwhelmed by information Patient reported stressors: Finances, Anxiety/ nervousness, and overwhelmed with the possibility of another serious medical diagnosis as she has already struggled significantly with her health. Hopes and/or priorities: Pt's priority is to complete diagnostic tests with the hope of a definitive diagnosis and treatment plan which will yield positive results Patient enjoys time with family/ friends Current coping skills/ strengths: Active sense of humor , Capable of independent living , Communication skills , Motivation for treatment/growth , and Supportive family/friends     SUMMARY: Current SDOH Barriers:  Financial constraints related to limited income  Clinical Social Work Clinical Goal(s):  Explore community resource options for unmet needs related to:  Financial Strain   Interventions: Discussed common feeling and emotions when being diagnosed with cancer, and the importance of support during treatment Informed patient of the support team roles and support services at [facility] Provided [place] contact information and encouraged patient to call with any questions or concerns Provided patient with information about the [employer] as well as [facility].  Pt to reach out to CSW pending diagnosis.  Pt has experienced anxiety for a number of years and is prescribed valium and zoloft by her PCP.  Pt will need to change her cardiac medication and has needed to stop her zoloft in order to do so.  Pt states she has been prescribed xanax in the interim and will seek mental health support once more medically stable as she recognizes she would benefit from counseling.   Follow Up Plan: Patient will contact CSW with any support or resource needs Patient verbalizes understanding of plan: Yes    Henriette Combs, LCSW   Patient  is participating in a Managed Medicaid Plan:  Yes

## 2022-07-03 ENCOUNTER — Ambulatory Visit: Admit: 2022-07-03 | Payer: Medicaid Other | Admitting: General Surgery

## 2022-07-03 SURGERY — BIOPSY, LYMPH NODE, INGUINAL, OPEN
Anesthesia: Choice | Laterality: Right

## 2022-07-03 NOTE — Progress Notes (Signed)
I notified patient of results: A. LYMPH NODE, RIGHT GROIN, EXCISION: Follicular lymphoma, low-grade.  She has follow up with Dr. Delton Coombes 3/4 and I will see her 3/6.

## 2022-07-03 NOTE — Telephone Encounter (Signed)
Pt notified to stop propafenone as of 3/9. Pt will be weaned to '25mg'$  of zoloft by admission.

## 2022-07-05 DIAGNOSIS — C8200 Follicular lymphoma grade I, unspecified site: Secondary | ICD-10-CM | POA: Insufficient documentation

## 2022-07-05 NOTE — Progress Notes (Signed)
[facility] [address] [place] [address]    Clinic Day:  07/06/2022  Referring physician: Valentino Nose, FNP  Patient Care Team: Valentino Nose, FNP as PCP - General (Family Medicine) Josue Hector, MD as PCP - Cardiology (Cardiology) Celene Squibb, MD as PCP - Internal Medicine (Internal Medicine) Fay Records, MD as Consulting Physician (Cardiology) Derek Jack, MD as Medical Oncologist (Medical Oncology) Brien Mates, RN as Oncology Nurse Navigator (Medical Oncology)   ASSESSMENT & PLAN:   Assessment: 1.  Stage III follicular lymphoma, grade 1: - CT chest (06/03/2022): Newly enlarged bilateral axillary, subpectoral, low cervical, mediastinal, hilar and most likely upper abdominal lymph nodes.  No evidence of fibrotic interstitial lung disease. - PET scan (06/18/2022): Generalized lymphadenopathy more in the abdomen and retroperitoneum but seen throughout the low neck, chest, abdomen and pelvis, with SUV ranging between 2-4.  Spleen top normal size at 12 cm with FDG uptake slightly less than the liver.  Hepatic steatosis.  Low-level diffuse FDG activity within the marrow spaces throughout the spine and pelvis with no focal area of increased metabolic activity. - No B symptoms.  Rare night sweats. 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  Also has paroxysmal AF. - Right groin lymph node biopsy (06/25/2022): Follicular lymphoma, grade 1 - She complains of severe fatigue for the last 6 to 8 months, gradually worsening and interfering with her day-to-day activities.   2.  Social/family history: - Lives at home with her husband and son.  Seen with daughter Raquel Sarna today.  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  No chemical exposure.  Non-smoker.  Secondhand smoke  exposure present. - Maternal grandmother died of leukemia.  Maternal grandfather had lung cancer.  Paternal grandfather had lung cancer.  Paternal great aunt had cancer.  Maternal uncle had breast cancer x 2.  Plan: 1.  Stage III follicular lymphoma, grade 1: - We have reviewed right groin lymph node biopsy results in detail. - I have reviewed her recent hospitalization records when she underwent cardioversion for A-fib.  She is back in A-fib today.  She is being hospitalized again next Tuesday for switching to Tikosyn. - She reports 6 to 8 months of worsening fatigue which is interfering her day-to-day activities.  She does not have any fevers, night sweats or weight loss. - I have reviewed her labs from 06/23/2022.  LDH is elevated at 200.  Beta-2 microglobulin 2.7.  White count and platelet count elevated likely from prednisone, predominantly neutrophils.  Hepatitis B and C serologies negative. - As the PET scan is suggesting diffuse FDG activity within the marrow spaces, I have recommended bone marrow biopsy to rule out lymphomatous involvement of the bone marrow. - Treatment indication is severe fatigue which is new in the last 6 months. - We discussed treatment with combination of Bendamustine and rituximab for 4-6 cycles. - We will arrange for port placement. - I will see her back after the biopsy.  Orders Placed This Encounter  Procedures   CT BONE MARROW BIOPSY & ASPIRATION    Sugical path; cytogenetics    Standing Status:   Future    Standing Expiration Date:   07/06/2023    Order Specific Question:   Reason for Exam (SYMPTOM  OR DIAGNOSIS REQUIRED)  Answer:   follicular lymphoma staging    Order Specific Question:   Is patient pregnant?    Answer:   No    Order Specific Question:   Preferred location?    Answer:   [facility]   IR IMAGING GUIDED PORT INSERTION    Standing Status:   Future    Standing Expiration Date:   07/06/2023    Order Specific Question:   Reason for  Exam (SYMPTOM  OR DIAGNOSIS REQUIRED)    Answer:   chemotherapy administration    Order Specific Question:   Is the patient pregnant?    Answer:   No    Order Specific Question:   Preferred Imaging Location?    Answer:   [facility]      I,Alexis Herring,acting as a scribe for Derek Jack, MD.,have documented all relevant documentation on the behalf of Derek Jack, MD,as directed by  Derek Jack, MD while in the presence of Derek Jack, MD.   I, Derek Jack MD, have reviewed the above documentation for accuracy and completeness, and I agree with the above.   Derek Jack, MD   3/4/20246:48 PM  CHIEF COMPLAINT:   Diagnosis: Grade 1 stage III follicular lymphoma   Cancer Staging  Follicular lymphoma grade I ([facility]) Staging form: Hodgkin and Non-Hodgkin Lymphoma, AJCC 8th Edition - Clinical stage from [id number]: Stage III (Follicular lymphoma) - Unsigned    Prior Therapy: None  Current Therapy: Bendamustine and rituximab   HISTORY OF PRESENT ILLNESS:   Oncology History   No history exists.     INTERVAL HISTORY:   Mallory Sutton is a 62 y.o. female presenting to clinic today for follow up of generalized lymph nodes and biopsy results discussion. She was last seen by me on 06/23/22 for consult for generalized lymphadenopathy noted on PET scan.  She has since had a right groin lymph node biopsy which showed follicular lymphoma, low-grade results.  Today, she states that she is doing well overall. Her appetite level is at 75% %. Her energy level is at 0%.  She reports that her energy levels have gradually worsened in the last 6 to 8 months.  I have reviewed recent hospitalization records.   PAST MEDICAL HISTORY:   Past Medical History: Past Medical History:  Diagnosis Date   Aortic stenosis    ATRIAL ARRHYTHMIAS    Bicuspid aortic valve    CHF (congestive heart failure) (HCC)    COARCTATION OF AORTA    ENDOMETRIOSIS     Heart murmur    Paroxysmal atrial fibrillation (HCC)    Shortness of breath    THORACIC AORTIC ANEURYSM    Vasculitis ([place])     Surgical History: Past Surgical History:  Procedure Laterality Date   CARDIOVERSION N/A 08/29/2019   Procedure: CARDIOVERSION;  Surgeon: Arnoldo Lenis, MD;  Location: [facility];  Service: Endoscopy;  Laterality: N/A;   coarctation repair and residual restenosis     KNEE SURGERY     LYMPH NODE BIOPSY Right 06/25/2022   Procedure: LYMPH NODE BIOPSY; right groin;  Surgeon: Virl Cagey, MD;  Location: [facility];  Service: General;  Laterality: Right;   TEE WITHOUT CARDIOVERSION N/A 07/12/2019   Procedure: TRANSESOPHAGEAL ECHOCARDIOGRAM (TEE);  Surgeon: Sueanne Margarita, MD;  Location: [facility] ENDOSCOPY;  Service: Cardiovascular;  Laterality: N/A;   TEE WITHOUT CARDIOVERSION N/A 08/29/2019   Procedure: TRANSESOPHAGEAL ECHOCARDIOGRAM (TEE) WITH PROPOFOL;  Surgeon: Arnoldo Lenis, MD;  Location: [facility];  Service: Endoscopy;  Laterality: N/A;    Social History: Social History   Socioeconomic History   Marital status: Married    Spouse name: Not on file   Number of children: Not on file   Years of education: Not on file   Highest education level: Not on file  Occupational History   Not on file  Tobacco Use   Smoking status: Never    Passive exposure: Past   Smokeless tobacco: Never  Vaping Use   Vaping Use: Never used  Substance and Sexual Activity   Alcohol use: Yes    Alcohol/week: 0.0 standard drinks of alcohol    Comment: 3 beers 3 nights a week   Drug use: No   Sexual activity: Never  Other Topics Concern   Not on file  Social History Narrative   Not on file   Social Determinants of Health   Financial Resource Strain: High Risk (07/02/2022)   Overall Financial Resource Strain (CARDIA)    Difficulty of Paying Living Expenses: Very hard  Food Insecurity: Food Insecurity Present (06/24/2022)   Hunger Vital Sign    Worried About Running Out of  Food in the Last Year: Sometimes true    Ran Out of Food in the Last Year: Sometimes true  Transportation Needs: No Transportation Needs (06/24/2022)   PRAPARE - Hydrologist (Medical): No    Lack of Transportation (Non-Medical): No  Physical Activity: Not on file  Stress: Not on file  Social Connections: Not on file  Intimate Partner Violence: Not At Risk (06/24/2022)   Humiliation, Afraid, Rape, and Kick questionnaire    Fear of Current or Ex-Partner: No    Emotionally Abused: No    Physically Abused: No    Sexually Abused: No    Family History: Family History  Problem Relation Age of Onset   Suicidality Mother    Mental illness Mother    Drug abuse Brother    Healthy Daughter    Healthy Son     Current Medications:  Current Outpatient Medications:    acetaminophen (TYLENOL) 325 MG tablet, Take 2 tablets (650 mg total) by mouth every 6 (six) hours as needed for mild pain (or Fever >/= 101)., Disp: 100 tablet, Rfl: 1   albuterol (VENTOLIN HFA) 108 (90 Base) MCG/ACT inhaler, Inhale 2 puffs into the lungs every 4 (four) hours as needed for wheezing or shortness of breath., Disp: 54 g, Rfl: 6   ALPRAZolam (XANAX) 0.5 MG tablet, Take 0.5 mg by mouth 3 (three) times daily., Disp: , Rfl:    aspirin EC 81 MG tablet, Take 1 tablet (81 mg total) by mouth daily with breakfast., Disp: 30 tablet, Rfl: 11   Budeson-Glycopyrrol-Formoterol (BREZTRI AEROSPHERE) 160-9-4.8 MCG/ACT AERO, Inhale 2 puffs into the lungs 2 (two) times daily., Disp: 10.7 g, Rfl: 1   calcium-vitamin D (OSCAL WITH D) 500-5 MG-MCG tablet, Take 1 tablet by mouth daily., Disp: 30 tablet, Rfl: 2   diazepam (VALIUM) 10 MG tablet, Take 1 tablet (10 mg total) by mouth daily as needed for sleep., Disp: 15 tablet, Rfl: 0   feeding supplement (ENSURE ENLIVE / ENSURE PLUS) LIQD, Take 237 mLs by mouth 2 (two) times daily between meals., Disp: , Rfl:    furosemide (LASIX) 40 MG tablet, Take 2 tablets (80 mg  total) by mouth daily., Disp: 30 tablet, Rfl: 3   gabapentin (NEURONTIN) 300 MG capsule, Take 300-600 mg by mouth See admin instructions. Take 300 mg in the morning, 300  mg in the afternoon, and 600 mg at bedtime, Disp: , Rfl:    HYDROcodone-acetaminophen (NORCO/VICODIN) 5-325 MG tablet, Take 2 tablets by mouth 2 (two) times daily., Disp: , Rfl:    ipratropium-albuterol (DUONEB) 0.5-2.5 (3) MG/3ML SOLN, Inhale 3 mLs into the lungs 2 (two) times daily as needed (shortness of breath)., Disp: 360 mL, Rfl: 1   magnesium oxide (MAGOX 400) 400 (240 Mg) MG tablet, Take 1 tablet (400 mg total) by mouth daily., Disp: 30 tablet, Rfl: 2   metoprolol succinate (TOPROL-XL) 50 MG 24 hr tablet, Take 1 tablet (50 mg total) by mouth daily. Take with or immediately following a meal., Disp: 30 tablet, Rfl: 11   Multiple Vitamin (MULTI-VITAMINS) TABS, Take 1 tablet by mouth daily. , Disp: , Rfl:    ondansetron (ZOFRAN ODT) 4 MG disintegrating tablet, Take 1 tablet (4 mg total) by mouth every 8 (eight) hours as needed for nausea or vomiting., Disp: 20 tablet, Rfl: 0   pantoprazole (PROTONIX) 40 MG tablet, Take 1 tablet (40 mg total) by mouth daily., Disp: 30 tablet, Rfl: 2   potassium chloride SA (KLOR-CON M) 20 MEQ tablet, Take 1 tablet (20 mEq total) by mouth 2 (two) times daily for 7 days, THEN 1 tablet (20 mEq total) daily for 7 days. Take 3 tablets daily when taking lasix., Disp: 90 tablet, Rfl: 3   predniSONE (DELTASONE) 20 MG tablet, Take 1 tablet (20 mg total) by mouth daily with breakfast., Disp: 90 tablet, Rfl: 3   propafenone (RYTHMOL SR) 325 MG 12 hr capsule, Take 1 capsule (325 mg total) by mouth 2 (two) times daily., Disp: 60 capsule, Rfl: 11   sertraline (ZOLOFT) 100 MG tablet, Take 1 tablet (100 mg total) by mouth daily., Disp: 30 tablet, Rfl: 3   Turmeric 500 MG CAPS, Take 500 mg by mouth 3 (three) times a week., Disp: , Rfl:    warfarin (COUMADIN) 1 MG tablet, Take 3 tablets daily except 4 tablets on  Fridays or as directed by coumadin clinic, Disp: 100 tablet, Rfl: 5   zolpidem (AMBIEN) 10 MG tablet, Take 1 tablet (10 mg total) by mouth at bedtime as needed for sleep., Disp: 10 tablet, Rfl: 0   enoxaparin (LOVENOX) 40 MG/0.4ML injection, Inject 0.4 mLs (40 mg total) into the skin every 12 (twelve) hours for 5 days. Stop Lovenox when INR gets to 2.0, Disp: 4 mL, Rfl: 0   Allergies: Allergies  Allergen Reactions   Amiodarone Nausea Only   Penicillins Hives    Has patient had a PCN reaction causing immediate rash, facial/tongue/throat swelling, SOB or lightheadedness with hypotension: YES Has patient had a PCN reaction causing severe rash involving mucus membranes or skin necrosis: NO Has patient had a PCN reaction that required hospitalization NO Has patient had a PCN reaction occurring within the last 10 years: NO If all of the above answers are "NO", then may proceed with Cephalosporin use.     REVIEW OF SYSTEMS:   Review of Systems  Constitutional:  Negative for chills, fatigue and fever.  HENT:   Negative for lump/mass, mouth sores, nosebleeds, sore throat and trouble swallowing.   Eyes:  Negative for eye problems.  Respiratory:  Negative for cough and shortness of breath.   Cardiovascular:  Negative for chest pain, leg swelling and palpitations.  Gastrointestinal:  Negative for abdominal pain, constipation, diarrhea, nausea and vomiting.  Genitourinary:  Negative for bladder incontinence, difficulty urinating, dysuria, frequency, hematuria and nocturia.   Musculoskeletal:  Negative  for arthralgias, back pain, flank pain, myalgias and neck pain.  Skin:  Negative for itching and rash.  Neurological:  Negative for dizziness, headaches and numbness.  Hematological:  Does not bruise/bleed easily.  Psychiatric/Behavioral:  Negative for depression, sleep disturbance and suicidal ideas. The patient is not nervous/anxious.   All other systems reviewed and are negative.    VITALS:    Blood pressure 139/84, pulse (!) 145, temperature 98.4 F (36.9 C), temperature source Oral, resp. rate 18, weight 103 lb 9.6 oz (47 kg), last menstrual period 07/16/2013, SpO2 100 %.  Wt Readings from Last 3 Encounters:  07/06/22 103 lb 9.6 oz (47 kg)  06/30/22 105 lb 12.8 oz (48 kg)  06/29/22 109 lb 6.4 oz (49.6 kg)    Body mass index is 17.78 kg/m.  Performance status (ECOG): 1 - Symptomatic but completely ambulatory  PHYSICAL EXAM:   Physical Exam Vitals and nursing note reviewed. Exam conducted with a chaperone present.  Constitutional:      Appearance: Normal appearance.  Cardiovascular:     Rate and Rhythm: Normal rate and regular rhythm.     Pulses: Normal pulses.     Heart sounds: Normal heart sounds.  Pulmonary:     Effort: Pulmonary effort is normal.     Breath sounds: Normal breath sounds.  Abdominal:     Palpations: Abdomen is soft. There is no hepatomegaly, splenomegaly or mass.     Tenderness: There is no abdominal tenderness.  Musculoskeletal:     Right lower leg: No edema.     Left lower leg: No edema.  Lymphadenopathy:     Cervical: No cervical adenopathy.     Right cervical: No superficial, deep or posterior cervical adenopathy.    Left cervical: No superficial, deep or posterior cervical adenopathy.     Upper Body:     Right upper body: No supraclavicular or axillary adenopathy.     Left upper body: No supraclavicular or axillary adenopathy.  Neurological:     General: No focal deficit present.     Mental Status: She is alert and oriented to person, place, and time.  Psychiatric:        Mood and Affect: Mood normal.        Behavior: Behavior normal.     LABS:      Latest Ref Rng & Units 06/27/2022    2:45 AM 06/26/2022    6:33 AM 06/25/2022    4:49 AM  CBC  WBC 4.0 - 10.5 K/uL 15.7  17.1  11.4   Hemoglobin 12.0 - 15.0 g/dL 9.6  9.8  9.2   Hematocrit 36.0 - 46.0 % 31.7  33.1  30.2   Platelets 150 - 400 K/uL 299  279  262       Latest  Ref Rng & Units 06/26/2022    6:33 AM 06/25/2022    4:49 AM 06/24/2022    5:32 AM  CMP  Glucose 70 - 99 mg/dL 99  91  103   BUN 6 - 20 mg/dL '11  16  19   '$ Creatinine 0.44 - 1.00 mg/dL 0.69  0.91  0.82   Sodium 135 - 145 mmol/L 137  139  139   Potassium 3.5 - 5.1 mmol/L 3.9  3.6  4.1   Chloride 98 - 111 mmol/L 104  107  106   CO2 22 - 32 mmol/L '26  25  26   '$ Calcium 8.9 - 10.3 mg/dL 8.1  8.3  7.9  No results found for: "CEA1", "CEA" / No results found for: "CEA1", "CEA" No results found for: "PSA1" No results found for: "[id number]" No results found for: "CAN125"  No results found for: "TOTALPROTELP", "ALBUMINELP", "A1GS", "A2GS", "BETS", "BETA2SER", "GAMS", "MSPIKE", "SPEI" No results found for: "TIBC", "FERRITIN", "IRONPCTSAT" Lab Results  Component Value Date   LDH 200 (H) 06/23/2022   06/23/22    Beta-2 Microglobulin 0.6 - 2.4 mg/L 2.7    Pathology 06/25/22: FINAL MICROSCOPIC DIAGNOSIS:  A. LYMPH NODE, RIGHT GROIN, EXCISION: -  Follicular lymphoma, low-grade  Note: The normal lymph node architecture is effaced by a nodular pattern of predominantly small irregular cleaved type/centrocyte type lymphocytes.  Only a rare centroblasts is present.  The capsule is also effaced with loss of most of the capsular sinuses and extension into the perinodal fat by these small lymphocytes.  These lymphocytes are almost exclusively CD20 positive B cells that coexpress CD10 as well as prominent subset BCL6 consistent with a germinal center origin.  They aberrantly coexpress Bcl-2.  CD23 highlights an expanded abnormal follicular dendritic cell meshwork.  The proliferation rate by Ki-67 is overall low, <5%.  Flow cytometry identifies 85% CD10 positive kappa restricted B cells.  The above findings are consistent with a follicular lymphoma, low-grade (grade 1 of 3) with a near [id number] follicular pattern.  GROSS DESCRIPTION:  Received fresh is a 2.1 cm soft well-defined nodule with  homogeneous pink-tan cut surfaces.  Touch preparations are made on 1 slide, portions of tissue are submitted in RPMI for possible flow cytometry, and remaining specimen is sectioned and entirely submitted in 2 blocks for routine histology.    STUDIES:   DG Chest 2 View  Result Date: 06/23/2022 CLINICAL DATA:  Chest pain and shortness of breath EXAM: CHEST - 2 VIEW COMPARISON:  02/03/2022 FINDINGS: Cardiac shadow is within normal limits. Postsurgical changes are again seen and stable. Lungs are well aerated bilaterally. No focal infiltrate or effusion is noted. Previously seen nipple shadows are less well appreciated on today's exam. No bony abnormality is noted. IMPRESSION: No acute abnormality noted. Electronically Signed   By: Inez Catalina M.D.   On: 06/23/2022 18:47   NM PET Image Initial (PI) Skull Base To Thigh  Result Date: 06/18/2022 CLINICAL DATA:  Initial treatment strategy for lymphadenopathy, concern for lymphoma. EXAM: NUCLEAR MEDICINE PET SKULL BASE TO THIGH TECHNIQUE: 5.28 mCi F-18 FDG was injected intravenously. Full-ring PET imaging was performed from the skull base to thigh after the radiotracer. CT data was obtained and used for attenuation correction and anatomic localization. Fasting blood glucose: 91 mg/dl COMPARISON:  January 06/02/2022 FINDINGS: Mediastinal blood pool activity: SUV max 1.31 Liver activity: SUV max 1.89 NECK: Adenopathy at the thoracic inlet, see below. Incidental CT findings: None. CHEST: Bilateral thoracic inlet adenopathy. SUV on the LEFT of lymph nodes measuring up to 12 mm 2.84. (Image 63/3) AP window lymph node at approximately 12 mm with a maximum SUV of 3.60. (Image 86/3) multiple lymph nodes in the retropectoral region bilaterally at or less than a cm. RIGHT paratracheal lymph nodes with similar appearance including a high RIGHT paratracheal lymph node on image 64/3 also displaying low level FDG uptake and a 9 mm short axis dimension. No focal  parenchymal FDG uptake in the lungs. Incidental CT findings: Post aortic valve replacement and ascending thoracic aortic repair. Persistent LEFT SVC. Moderate cardiomegaly without effusion. Airways are patent. Lungs are clear without signs of consolidative change. ABDOMEN/PELVIS: Similar pattern of nodal enlargement in  the abdomen and in the pelvis. Lymph nodes display similar low-to-moderate level uptake of FDG. Most pronounced adenopathy in the LEFT retroperitoneum largest 15 mm as a discrete lymph node on image 160/3 showing a maximum SUV of 3.89. Similar FDG uptake throughout all retroperitoneal and upper abdominal lymph nodes. Lymph nodes seen in the gastrohepatic ligament on image 145/3 measuring 11 mm. Intra-aortocaval nodal enlargement (image 171/3 with a levin mm lymph nodes. LEFT common iliac lymph node at 10 mm also with mild-to-moderate FDG uptake maximum SUV of 2.90. LEFT pelvic sidewall lymph node (image 220/3 10 mm short axis with similar FDG avidity. Lymph nodes in the bilateral groin slightly greater than or just less than a cm size with similar FDG uptake. No focal area of solid organ activity. Spleen top normal size at 12 cm with splenic FDG uptake slightly less than liver. Incidental CT findings: Liver density up to 65 Hounsfield units. Mildly lobular hepatic contours. Gallbladder not visualized and potentially surgically absent. No acute findings grossly relative to pancreas, spleen, adrenal glands or kidneys. Adrenal glands is cured by numerous retroperitoneal lymph nodes. No perinephric stranding or signs of hydronephrosis. Urinary bladder is collapsed limiting assessment. No acute gastrointestinal process. Unremarkable appearance of reproductive structures. SKELETON: Low level diffuse FDG activity within marrow spaces throughout the spine and pelvis. No focal area of increased metabolic activity. Incidental CT findings: Spinal degenerative changes with rotary dextroconvex curvature of the  lumbar spine. Compression fracture at the L2 level with chronic appearance similar to chest radiograph of October 08, 2014 in terms of mild kyphotic angulation of the spine at this level. IMPRESSION: 1. Generalized adenopathy perhaps slightly greatest in the abdomen in the retroperitoneum but seen throughout the low neck, chest, abdomen and pelvis. Findings are most suspicious for lymphoproliferative disorder showing mild-to-moderate FDG uptake. FDG uptake is greater than blood pool and liver. 2. Spleen top normal size with low level FDG uptake less than liver activity. 3. Low level diffuse FDG activity within marrow spaces throughout the spine and pelvis. No focal area of increased metabolic activity. In the absence of marrow stimulation findings are concerning for marrow space involvement. 4. Post aortic valve replacement and ascending thoracic aortic repair. 5. Hepatic steatosis. 6. 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 Increased density of the liver can be seen in the setting of amiodarone deposition. Correlate with any clinical or laboratory evidence of liver disease. Electronically Signed   By: Zetta Bills M.D.   On: 06/18/2022 17:16

## 2022-07-06 ENCOUNTER — Inpatient Hospital Stay: Payer: Medicaid Other | Attending: Hematology | Admitting: Hematology

## 2022-07-06 VITALS — BP 139/84 | HR 145 | Temp 98.4°F | Resp 18 | Wt 103.6 lb

## 2022-07-06 DIAGNOSIS — C8208 Follicular lymphoma grade I, lymph nodes of multiple sites: Secondary | ICD-10-CM | POA: Diagnosis not present

## 2022-07-06 DIAGNOSIS — C8200 Follicular lymphoma grade I, unspecified site: Secondary | ICD-10-CM | POA: Insufficient documentation

## 2022-07-06 DIAGNOSIS — R591 Generalized enlarged lymph nodes: Secondary | ICD-10-CM | POA: Insufficient documentation

## 2022-07-06 DIAGNOSIS — Z95828 Presence of other vascular implants and grafts: Secondary | ICD-10-CM

## 2022-07-06 NOTE — Patient Instructions (Addendum)
[place] at [facility] Discharge Instructions   You were seen and examined today by Dr. Delton Coombes.  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 This lymphoma is a slow growing lymphoma. We typically do not treat this if it is not causing you any symptoms. Your hemoglobin and other blood counts are normal, and you do not describe most of the symptoms related to lymphoma. You are experiencing severe fatigue, which can also be an indication for treatment.   He reviewed the results of your PET scan. There is some lighting up in the lymph nodes as well as in the bone marrow.   He recommends you having a bone marrow biopsy to stage this lymphoma.   We will see you back a couple week after the biopsy to discuss the results.      Thank you for choosing [place] at [facility] to provide your oncology and hematology care.  To afford each patient quality time with our provider, please arrive at least 15 minutes before your scheduled appointment time.   If you have a lab appointment with the [facility] please come in thru the Main Entrance and check in at the main information desk.  You need to re-schedule your appointment should you arrive 10 or more minutes late.  We strive to give you quality time with our providers, and arriving late affects you and other patients whose appointments are after yours.  Also, if you no show three or more times for appointments you may be dismissed from the clinic at the providers discretion.     Again, thank you for choosing [facility].  Our hope is that these requests will decrease the amount of time that you wait before being seen by our physicians.       _____________________________________________________________  Should you have questions after your visit to [facility], please contact our  office at [phone] and follow the prompts.  Our office hours are 8:00 a.m. and 4:30 p.m. Monday - Friday.  Please note that voicemails left after 4:00 p.m. may not be returned until the following business day.  We are closed weekends and major holidays.  You do have access to a nurse 24-7, just call the main number to the clinic [phone] and do not press any options, hold on the line and a nurse will answer the phone.    For prescription refill requests, have your pharmacy contact our office and allow 72 hours.    Due to Covid, you will need to wear a mask upon entering the hospital. If you do not have a mask, a mask will be given to you at the Main Entrance upon arrival. For doctor visits, patients may have 1 support person age 72 or older with them. For treatment visits, patients can not have anyone with them due to social distancing guidelines and our immunocompromised population.

## 2022-07-06 NOTE — Progress Notes (Signed)
START ON PATHWAY REGIMEN - Lymphoma and CLL     A cycle is every 28 days:     Rituximab-xxxx      Bendamustine   **Always confirm dose/schedule in your pharmacy ordering system**  Patient Characteristics: Follicular Lymphoma, Grades 1, 2, and 3A, First Line, Stage III / IV, Symptomatic or Bulky Disease Disease Type: Follicular Lymphoma, Grade 1, 2, or 3A Disease Type: Not Applicable Disease Type: Not Applicable Line of Therapy: First Line Disease Characteristics: Symptomatic or Bulky Disease Intent of Therapy: Non-Curative / Palliative Intent, Discussed with Patient 

## 2022-07-07 ENCOUNTER — Ambulatory Visit: Payer: Medicaid Other | Admitting: Student

## 2022-07-07 ENCOUNTER — Ambulatory Visit: Payer: Medicaid Other | Attending: Cardiovascular Disease | Admitting: *Deleted

## 2022-07-07 DIAGNOSIS — Z5181 Encounter for therapeutic drug level monitoring: Secondary | ICD-10-CM | POA: Diagnosis not present

## 2022-07-07 DIAGNOSIS — I4892 Unspecified atrial flutter: Secondary | ICD-10-CM

## 2022-07-07 DIAGNOSIS — Q231 Congenital insufficiency of aortic valve: Secondary | ICD-10-CM

## 2022-07-07 LAB — POCT INR: INR: 3.5 — AB (ref 2.0–3.0)

## 2022-07-07 NOTE — Patient Instructions (Signed)
Hold your warfarin today, then resume taking warfarin '3mg'$  daily except '4mg'$  on Fridays. Recheck INR weekly pending Tikosyn (dofetilide) admission. Continue Ensure 2 daily per MD

## 2022-07-08 ENCOUNTER — Telehealth (HOSPITAL_COMMUNITY): Payer: Self-pay | Admitting: *Deleted

## 2022-07-08 ENCOUNTER — Other Ambulatory Visit: Payer: Self-pay

## 2022-07-08 ENCOUNTER — Encounter: Payer: Self-pay | Admitting: General Surgery

## 2022-07-08 ENCOUNTER — Ambulatory Visit (INDEPENDENT_AMBULATORY_CARE_PROVIDER_SITE_OTHER): Payer: Medicaid Other | Admitting: General Surgery

## 2022-07-08 VITALS — BP 117/77 | HR 122 | Temp 98.5°F | Resp 18 | Ht 64.0 in | Wt 106.0 lb

## 2022-07-08 DIAGNOSIS — R599 Enlarged lymph nodes, unspecified: Secondary | ICD-10-CM

## 2022-07-08 NOTE — Patient Instructions (Addendum)
Talk to your Cardiologist about the warfarin and if you can come off of this for your procedure (bone marrow biopsy and the port).  Ok to shower. Do not submerge until 4 weeks out.

## 2022-07-08 NOTE — Telephone Encounter (Signed)
Patient is scheduled for Tikosyn admission on 07/14/22-07/17/22 for afib management. Per Dr. Lovena Le pt will not be able to stop coumadin for 4 weeks post admission due to cardioversion. Feels rhythm/rate control in necessary prior to Bone Marrow biopsy/Port placement will need to be rescheduled for after 08/07/22.

## 2022-07-08 NOTE — Telephone Encounter (Signed)
Please note above

## 2022-07-08 NOTE — Telephone Encounter (Signed)
We discussed treatment with combination of Bendamustine and rituximab for 4-6 cycles. -- is this ok with tikosyn?   Tue 2:47 PM MS Megan E Supple, RPH-CPP no interaction that I can see

## 2022-07-09 ENCOUNTER — Telehealth (HOSPITAL_COMMUNITY): Payer: Self-pay | Admitting: Emergency Medicine

## 2022-07-09 NOTE — Progress Notes (Signed)
[facility]  Follicular lymphoma on pathology. Doing ok.  No issues with wound  BP 117/77   Pulse (!) 122 Comment: irregular/ AFIB  Temp 98.5 F (36.9 C) (Oral)   Resp 18   Ht '5\' 4"'$  (1.626 m)   Wt 106 lb (48.1 kg)   LMP 07/16/2013   SpO2 98% Comment: 2L via [place]  BMI 18.19 kg/m  Right groin incision healing, no erythema or drainage, minor ecchymosis   Patient s/p right inguinal lymph node biopsy. Follicular lymphoma.  Getting worked up further with Dr. Delton Coombes. Expects to have a Bone marrow biopsy and port placed in the upcoming weeks. Told her to check on if she can hold her anticoagulation after her cardioversion or not.   Ok to shower. Do not submerge until 4 weeks out.  Curlene Labrum, MD [facility] [address] [place], [place] [id number] [phone] (office)

## 2022-07-09 NOTE — Telephone Encounter (Signed)
Calling patient to cancel cardiac CTA since she's still in afib despite DCCV. Pt states she got DCCV 06/23/22 and it lasted 6 days and she converted back to afib.   Will send message to her card team regarding this matter.  Marchia Bond RN Navigator Cardiac Imaging [facility] [phone] Office  [phone] Cell

## 2022-07-10 ENCOUNTER — Encounter (HOSPITAL_COMMUNITY): Payer: Self-pay

## 2022-07-10 ENCOUNTER — Ambulatory Visit: Payer: Medicaid Other | Admitting: Student

## 2022-07-13 ENCOUNTER — Ambulatory Visit (HOSPITAL_COMMUNITY): Payer: Medicaid Other

## 2022-07-14 ENCOUNTER — Ambulatory Visit (HOSPITAL_COMMUNITY)
Admission: RE | Admit: 2022-07-14 | Discharge: 2022-07-14 | Disposition: A | Discharge status: Home / Self Care | Payer: Medicaid Other | Source: Ambulatory Visit | Attending: Physician Assistant | Admitting: Physician Assistant

## 2022-07-14 ENCOUNTER — Encounter (HOSPITAL_COMMUNITY): Payer: Self-pay | Admitting: Physician Assistant

## 2022-07-14 ENCOUNTER — Ambulatory Visit: Payer: Medicaid Other | Admitting: Hematology

## 2022-07-14 ENCOUNTER — Ambulatory Visit (INDEPENDENT_AMBULATORY_CARE_PROVIDER_SITE_OTHER): Payer: Medicaid Other | Admitting: *Deleted

## 2022-07-14 ENCOUNTER — Inpatient Hospital Stay (HOSPITAL_COMMUNITY)
Admission: RE | Admit: 2022-07-14 | Discharge: 2022-07-17 | DRG: 309 | Disposition: A | Discharge status: Home / Self Care | Payer: Medicaid Other | Attending: Internal Medicine | Admitting: Internal Medicine

## 2022-07-14 VITALS — BP 122/84 | HR 143 | Ht 64.0 in | Wt 110.6 lb

## 2022-07-14 DIAGNOSIS — Z7952 Long term (current) use of systemic steroids: Secondary | ICD-10-CM

## 2022-07-14 DIAGNOSIS — J449 Chronic obstructive pulmonary disease, unspecified: Secondary | ICD-10-CM | POA: Diagnosis present

## 2022-07-14 DIAGNOSIS — D6869 Other thrombophilia: Secondary | ICD-10-CM | POA: Diagnosis present

## 2022-07-14 DIAGNOSIS — Q231 Congenital insufficiency of aortic valve: Secondary | ICD-10-CM | POA: Diagnosis not present

## 2022-07-14 DIAGNOSIS — I4892 Unspecified atrial flutter: Secondary | ICD-10-CM

## 2022-07-14 DIAGNOSIS — Z888 Allergy status to other drugs, medicaments and biological substances status: Secondary | ICD-10-CM | POA: Diagnosis not present

## 2022-07-14 DIAGNOSIS — Z7901 Long term (current) use of anticoagulants: Secondary | ICD-10-CM | POA: Diagnosis not present

## 2022-07-14 DIAGNOSIS — Z880 Allergy status to penicillin: Secondary | ICD-10-CM | POA: Diagnosis not present

## 2022-07-14 DIAGNOSIS — Z5986 Financial insecurity: Secondary | ICD-10-CM

## 2022-07-14 DIAGNOSIS — I5032 Chronic diastolic (congestive) heart failure: Secondary | ICD-10-CM | POA: Diagnosis present

## 2022-07-14 DIAGNOSIS — Z818 Family history of other mental and behavioral disorders: Secondary | ICD-10-CM | POA: Diagnosis not present

## 2022-07-14 DIAGNOSIS — D649 Anemia, unspecified: Secondary | ICD-10-CM | POA: Diagnosis not present

## 2022-07-14 DIAGNOSIS — R000 Tachycardia, unspecified: Secondary | ICD-10-CM | POA: Diagnosis present

## 2022-07-14 DIAGNOSIS — Z7982 Long term (current) use of aspirin: Secondary | ICD-10-CM | POA: Diagnosis not present

## 2022-07-14 DIAGNOSIS — Z813 Family history of other psychoactive substance abuse and dependence: Secondary | ICD-10-CM | POA: Diagnosis not present

## 2022-07-14 DIAGNOSIS — Z5181 Encounter for therapeutic drug level monitoring: Secondary | ICD-10-CM

## 2022-07-14 DIAGNOSIS — C8290 Follicular lymphoma, unspecified, unspecified site: Secondary | ICD-10-CM | POA: Diagnosis present

## 2022-07-14 DIAGNOSIS — Z5941 Food insecurity: Secondary | ICD-10-CM | POA: Diagnosis not present

## 2022-07-14 DIAGNOSIS — F418 Other specified anxiety disorders: Secondary | ICD-10-CM | POA: Diagnosis not present

## 2022-07-14 DIAGNOSIS — Z79899 Other long term (current) drug therapy: Secondary | ICD-10-CM

## 2022-07-14 DIAGNOSIS — I4819 Other persistent atrial fibrillation: Principal | ICD-10-CM | POA: Diagnosis present

## 2022-07-14 DIAGNOSIS — Z952 Presence of prosthetic heart valve: Secondary | ICD-10-CM | POA: Diagnosis not present

## 2022-07-14 DIAGNOSIS — I110 Hypertensive heart disease with heart failure: Secondary | ICD-10-CM | POA: Diagnosis present

## 2022-07-14 DIAGNOSIS — I4891 Unspecified atrial fibrillation: Secondary | ICD-10-CM | POA: Diagnosis not present

## 2022-07-14 LAB — BASIC METABOLIC PANEL
Anion gap: 14 (ref 5–15)
Anion gap: 10 (ref 5–15)
BUN: 15 mg/dL (ref 6–20)
BUN: 13 mg/dL (ref 6–20)
CO2: 27 mmol/L (ref 22–32)
CO2: 33 mmol/L — ABNORMAL HIGH (ref 22–32)
Calcium: 9 mg/dL (ref 8.9–10.3)
Calcium: 9.2 mg/dL (ref 8.9–10.3)
Chloride: 94 mmol/L — ABNORMAL LOW (ref 98–111)
Chloride: 92 mmol/L — ABNORMAL LOW (ref 98–111)
Creatinine, Ser: 1.04 mg/dL — ABNORMAL HIGH (ref 0.44–1.00)
Creatinine, Ser: 1.05 mg/dL — ABNORMAL HIGH (ref 0.44–1.00)
GFR, Estimated: >60 mL/min (ref >60)
GFR, Estimated: >60 mL/min (ref >60)
Glucose, Bld: 141 mg/dL — ABNORMAL HIGH (ref 70–99)
Glucose, Bld: 150 mg/dL — ABNORMAL HIGH (ref 70–99)
Potassium: 3.7 mmol/L (ref 3.5–5.1)
Potassium: 3.9 mmol/L (ref 3.5–5.1)
Sodium: 135 mmol/L (ref 135–145)
Sodium: 135 mmol/L (ref 135–145)

## 2022-07-14 LAB — PROTIME-INR
INR: 1.8 — ABNORMAL HIGH (ref 0.8–1.2)
Prothrombin Time: 20.5 seconds — ABNORMAL HIGH (ref 11.4–15.2)

## 2022-07-14 LAB — MAGNESIUM: Magnesium: 2.4 mg/dL (ref 1.7–2.4)

## 2022-07-14 LAB — POCT INR: INR: 2 (ref 2.0–3.0)

## 2022-07-14 MED ORDER — UMECLIDINIUM BROMIDE 62.5 MCG/ACT IN AEPB
1.0000 | INHALATION_SPRAY | Freq: Every day | RESPIRATORY_TRACT | Status: DC
  Administered 2022-07-15 – 2022-07-17 (×3): 1 via RESPIRATORY_TRACT
  Filled 2022-07-14: qty 7

## 2022-07-14 MED ORDER — ASPIRIN 81 MG PO TBEC
81.0000 mg | DELAYED_RELEASE_TABLET | Freq: Every day | ORAL | Status: DC
  Administered 2022-07-15 – 2022-07-17 (×3): 81 mg via ORAL
  Filled 2022-07-14 (×3): qty 1

## 2022-07-14 MED ORDER — DOFETILIDE 250 MCG PO CAPS
250.0000 ug | ORAL_CAPSULE | Freq: Two times a day (BID) | ORAL | Status: DC
  Administered 2022-07-14 – 2022-07-17 (×6): 250 ug via ORAL
  Filled 2022-07-14 (×6): qty 1

## 2022-07-14 MED ORDER — METOPROLOL SUCCINATE ER 50 MG PO TB24
50.0000 mg | ORAL_TABLET | Freq: Every day | ORAL | Status: DC
  Administered 2022-07-15 – 2022-07-17 (×3): 50 mg via ORAL
  Filled 2022-07-14 (×3): qty 1

## 2022-07-14 MED ORDER — WARFARIN - PHARMACIST DOSING INPATIENT: Freq: Every day | Status: DC

## 2022-07-14 MED ORDER — GABAPENTIN 300 MG PO CAPS
600.0000 mg | ORAL_CAPSULE | Freq: Every day | ORAL | Status: DC
  Administered 2022-07-14 – 2022-07-16 (×3): 600 mg via ORAL
  Filled 2022-07-14 (×3): qty 2

## 2022-07-14 MED ORDER — WARFARIN SODIUM 2.5 MG PO TABS
3.5000 mg | ORAL_TABLET | ORAL | Status: AC
  Administered 2022-07-14: 3.5 mg via ORAL
  Filled 2022-07-14: qty 1

## 2022-07-14 MED ORDER — ALBUTEROL SULFATE (2.5 MG/3ML) 0.083% IN NEBU: 2.5000 mg | INHALATION_SOLUTION | RESPIRATORY_TRACT | Status: DC | PRN

## 2022-07-14 MED ORDER — SODIUM CHLORIDE 0.9% FLUSH
3.0000 mL | Freq: Two times a day (BID) | INTRAVENOUS | Status: DC
  Administered 2022-07-14 – 2022-07-17 (×4): 3 mL via INTRAVENOUS

## 2022-07-14 MED ORDER — BUDESON-GLYCOPYRROL-FORMOTEROL 160-9-4.8 MCG/ACT IN AERO: 2.0000 | INHALATION_SPRAY | Freq: Two times a day (BID) | RESPIRATORY_TRACT | Status: DC

## 2022-07-14 MED ORDER — SODIUM CHLORIDE 0.9% FLUSH: 3.0000 mL | INTRAVENOUS | Status: DC | PRN

## 2022-07-14 MED ORDER — ALPRAZOLAM 0.5 MG PO TABS
0.5000 mg | ORAL_TABLET | Freq: Three times a day (TID) | ORAL | Status: DC
  Administered 2022-07-14 – 2022-07-17 (×8): 0.5 mg via ORAL
  Filled 2022-07-14 (×8): qty 1

## 2022-07-14 MED ORDER — OYSTER SHELL CALCIUM/D3 500-5 MG-MCG PO TABS
1.0000 | ORAL_TABLET | Freq: Every day | ORAL | Status: DC
  Administered 2022-07-15 – 2022-07-17 (×2): 1 via ORAL
  Filled 2022-07-14 (×3): qty 1

## 2022-07-14 MED ORDER — HYDROCODONE-ACETAMINOPHEN 5-325 MG PO TABS
2.0000 | ORAL_TABLET | Freq: Two times a day (BID) | ORAL | Status: DC | PRN
  Administered 2022-07-15 – 2022-07-17 (×5): 2 via ORAL
  Filled 2022-07-14 (×6): qty 2

## 2022-07-14 MED ORDER — ADULT MULTIVITAMIN W/MINERALS CH
1.0000 | ORAL_TABLET | Freq: Every day | ORAL | Status: DC
  Administered 2022-07-15 – 2022-07-17 (×3): 1 via ORAL
  Filled 2022-07-14 (×3): qty 1

## 2022-07-14 MED ORDER — MOMETASONE FURO-FORMOTEROL FUM 200-5 MCG/ACT IN AERO
2.0000 | INHALATION_SPRAY | Freq: Two times a day (BID) | RESPIRATORY_TRACT | Status: DC
  Administered 2022-07-14 – 2022-07-17 (×6): 2 via RESPIRATORY_TRACT
  Filled 2022-07-14: qty 8.8

## 2022-07-14 MED ORDER — GABAPENTIN 300 MG PO CAPS
300.0000 mg | ORAL_CAPSULE | Freq: Two times a day (BID) | ORAL | Status: DC
  Administered 2022-07-15 – 2022-07-17 (×5): 300 mg via ORAL
  Filled 2022-07-14 (×6): qty 1

## 2022-07-14 MED ORDER — ENSURE ENLIVE PO LIQD
237.0000 mL | Freq: Two times a day (BID) | ORAL | Status: DC
  Administered 2022-07-15 – 2022-07-17 (×4): 237 mL via ORAL

## 2022-07-14 MED ORDER — PANTOPRAZOLE SODIUM 40 MG PO TBEC
40.0000 mg | DELAYED_RELEASE_TABLET | Freq: Every day | ORAL | Status: DC
  Administered 2022-07-15 – 2022-07-17 (×3): 40 mg via ORAL
  Filled 2022-07-14 (×3): qty 1

## 2022-07-14 MED ORDER — FUROSEMIDE 40 MG PO TABS
80.0000 mg | ORAL_TABLET | Freq: Every day | ORAL | Status: DC
  Administered 2022-07-15 – 2022-07-17 (×3): 80 mg via ORAL
  Filled 2022-07-14 (×3): qty 2

## 2022-07-14 MED ORDER — ACETAMINOPHEN 325 MG PO TABS
650.0000 mg | ORAL_TABLET | Freq: Four times a day (QID) | ORAL | Status: DC | PRN
  Administered 2022-07-15: 650 mg via ORAL
  Filled 2022-07-14: qty 2

## 2022-07-14 MED ORDER — WARFARIN SODIUM 1 MG PO TABS: 1.0000 mg | ORAL_TABLET | Freq: Three times a day (TID) | ORAL | Status: DC

## 2022-07-14 MED ORDER — SERTRALINE HCL 50 MG PO TABS
25.0000 mg | ORAL_TABLET | Freq: Every day | ORAL | Status: DC
  Administered 2022-07-15 – 2022-07-17 (×3): 25 mg via ORAL
  Filled 2022-07-14 (×3): qty 1

## 2022-07-14 MED ORDER — POTASSIUM CHLORIDE CRYS ER 20 MEQ PO TBCR
20.0000 meq | EXTENDED_RELEASE_TABLET | Freq: Three times a day (TID) | ORAL | Status: DC
  Administered 2022-07-14 – 2022-07-17 (×8): 20 meq via ORAL
  Filled 2022-07-14 (×8): qty 1

## 2022-07-14 MED ORDER — IPRATROPIUM-ALBUTEROL 0.5-2.5 (3) MG/3ML IN SOLN
3.0000 mL | Freq: Two times a day (BID) | RESPIRATORY_TRACT | Status: DC | PRN
  Administered 2022-07-14 – 2022-07-17 (×3): 3 mL via RESPIRATORY_TRACT
  Filled 2022-07-14 (×3): qty 3

## 2022-07-14 MED ORDER — POTASSIUM CHLORIDE CRYS ER 20 MEQ PO TBCR
60.0000 meq | EXTENDED_RELEASE_TABLET | ORAL | Status: AC
  Administered 2022-07-14: 60 meq via ORAL
  Filled 2022-07-14: qty 3

## 2022-07-14 MED ORDER — METOPROLOL TARTRATE 25 MG PO TABS
25.0000 mg | ORAL_TABLET | Freq: Once | ORAL | Status: AC
  Administered 2022-07-14: 25 mg via ORAL
  Filled 2022-07-14: qty 1

## 2022-07-14 MED ORDER — MAGNESIUM OXIDE -MG SUPPLEMENT 400 (240 MG) MG PO TABS
400.0000 mg | ORAL_TABLET | Freq: Every day | ORAL | Status: DC
  Administered 2022-07-15 – 2022-07-17 (×3): 400 mg via ORAL
  Filled 2022-07-14 (×3): qty 1

## 2022-07-14 MED ORDER — POTASSIUM CHLORIDE CRYS ER 20 MEQ PO TBCR
20.0000 meq | EXTENDED_RELEASE_TABLET | ORAL | Status: AC
  Administered 2022-07-14: 20 meq via ORAL
  Filled 2022-07-14: qty 1

## 2022-07-14 MED ORDER — SODIUM CHLORIDE 0.9 % IV SOLN: 250.0000 mL | INTRAVENOUS | Status: DC | PRN

## 2022-07-14 MED ORDER — SERTRALINE HCL 25 MG PO TABS: 25.0000 mg | ORAL_TABLET | Freq: Every day | ORAL | Status: DC

## 2022-07-14 MED ORDER — PREDNISONE 20 MG PO TABS
20.0000 mg | ORAL_TABLET | Freq: Every day | ORAL | Status: DC
  Administered 2022-07-15 – 2022-07-17 (×3): 20 mg via ORAL
  Filled 2022-07-14 (×3): qty 1

## 2022-07-14 MED ORDER — ZOLPIDEM TARTRATE 5 MG PO TABS
5.0000 mg | ORAL_TABLET | Freq: Every evening | ORAL | Status: DC | PRN
  Administered 2022-07-15 – 2022-07-16 (×2): 5 mg via ORAL
  Filled 2022-07-14 (×2): qty 1

## 2022-07-14 NOTE — Progress Notes (Signed)
ANTICOAGULATION CONSULT NOTE - Initial Consult  Pharmacy Consult for warfarin Indication:  afib and mechanical AVR  Allergies  Allergen Reactions   Amiodarone Nausea Only   Penicillins Hives    Has patient had a PCN reaction causing immediate rash, facial/tongue/throat swelling, SOB or lightheadedness with hypotension: YES Has patient had a PCN reaction causing severe rash involving mucus membranes or skin necrosis: NO Has patient had a PCN reaction that required hospitalization NO Has patient had a PCN reaction occurring within the last 10 years: NO If all of the above answers are "NO", then may proceed with Cephalosporin use.     Patient Measurements:    Vital Signs: Temp: 98.7 F (37.1 C) (03/12 1554) BP: 126/90 (03/12 1554) Pulse Rate: 166 (03/12 1554)  Labs: Recent Labs    07/14/22 0847 07/14/22 1010  INR 2.0  --   CREATININE  --  1.04*    Estimated Creatinine Clearance: 45.6 mL/min (A) (by C-G formula based on SCr of 1.04 mg/dL (H)).   Medical History: Past Medical History:  Diagnosis Date   Aortic stenosis    ATRIAL ARRHYTHMIAS    Bicuspid aortic valve    CHF (congestive heart failure) (HCC)    COARCTATION OF AORTA    ENDOMETRIOSIS    Heart murmur    Paroxysmal atrial fibrillation (HCC)    Shortness of breath    THORACIC AORTIC ANEURYSM    Vasculitis (HCC)     Medications:  Medications Prior to Admission  Medication Sig Dispense Refill Last Dose   acetaminophen (TYLENOL) 325 MG tablet Take 2 tablets (650 mg total) by mouth every 6 (six) hours as needed for mild pain (or Fever >/= 101). 100 tablet 1    albuterol (VENTOLIN HFA) 108 (90 Base) MCG/ACT inhaler Inhale 2 puffs into the lungs every 4 (four) hours as needed for wheezing or shortness of breath. 54 g 6    ALPRAZolam (XANAX) 0.5 MG tablet Take 0.5 mg by mouth 3 (three) times daily.      aspirin EC 81 MG tablet Take 1 tablet (81 mg total) by mouth daily with breakfast. 30 tablet 11     Budeson-Glycopyrrol-Formoterol (BREZTRI AEROSPHERE) 160-9-4.8 MCG/ACT AERO Inhale 2 puffs into the lungs 2 (two) times daily. 10.7 g 1    calcium-vitamin D (OSCAL WITH D) 500-5 MG-MCG tablet Take 1 tablet by mouth daily. 30 tablet 2    feeding supplement (ENSURE ENLIVE / ENSURE PLUS) LIQD Take 237 mLs by mouth 2 (two) times daily between meals.      furosemide (LASIX) 40 MG tablet Take 2 tablets (80 mg total) by mouth daily. 30 tablet 3    gabapentin (NEURONTIN) 300 MG capsule Take 300-600 mg by mouth See admin instructions. Take 300 mg in the morning, 300 mg in the afternoon, and 600 mg at bedtime      HYDROcodone-acetaminophen (NORCO/VICODIN) 5-325 MG tablet Take 2 tablets by mouth 2 (two) times daily.      ipratropium-albuterol (DUONEB) 0.5-2.5 (3) MG/3ML SOLN Inhale 3 mLs into the lungs 2 (two) times daily as needed (shortness of breath). 360 mL 1    magnesium oxide (MAGOX 400) 400 (240 Mg) MG tablet Take 1 tablet (400 mg total) by mouth daily. 30 tablet 2    metoprolol succinate (TOPROL-XL) 50 MG 24 hr tablet Take 1 tablet (50 mg total) by mouth daily. Take with or immediately following a meal. 30 tablet 11    Multiple Vitamin (MULTI-VITAMINS) TABS Take 1 tablet by mouth daily.  pantoprazole (PROTONIX) 40 MG tablet Take 1 tablet (40 mg total) by mouth daily. 30 tablet 2    potassium chloride SA (KLOR-CON M) 20 MEQ tablet Take 1 tablet (20 mEq total) by mouth 2 (two) times daily for 7 days, THEN 1 tablet (20 mEq total) daily for 7 days. Take 3 tablets daily when taking lasix. 90 tablet 3    predniSONE (DELTASONE) 20 MG tablet Take 1 tablet (20 mg total) by mouth daily with breakfast. 90 tablet 3    sertraline (ZOLOFT) 25 MG tablet Take 1 tablet (25 mg total) by mouth daily.      warfarin (COUMADIN) 1 MG tablet Take 3 tablets daily except 4 tablets on Fridays or as directed by coumadin clinic 100 tablet 5    zolpidem (AMBIEN) 10 MG tablet Take 1 tablet (10 mg total) by mouth at bedtime as  needed for sleep. 10 tablet 0     Assessment: 62 y.o. F presents for dofetilide initiation. Pt on warfarin PTA for afib and mechancial AVR. Admission INR 2. CBC ok on admission. Home dose: '3mg'$  daily except for '4mg'$  on Friday  Goal of Therapy:  INR 2-3 (per outpt [facility] clinic notes) Monitor platelets by anticoagulation protocol: Yes   Plan:  Warfarin 3.'5mg'$  tonight Daily INR  Sherlon Handing, PharmD, BCPS Please see amion for complete clinical pharmacist phone list 07/14/2022,4:08 PM

## 2022-07-14 NOTE — Progress Notes (Signed)
Primary Care Physician: Valentino Nose, FNP Primary Cardiologist: Dr [name] Primary Electrophysiologist: Dr Lovena Le  Referring Physician: Dr Gray Bernhardt Mallory Sutton is a 62 y.o. female with a history of coarctation repair at age 19, bicuspid AV s/p AVR 2016 with MAZE and LAA occlusion, chronic diastolic CHF, COPD, lymphoma, atrial fibrillation who presents for follow up in the [facility]. Patient has been maintained on propafenone but was having more breakthrough episodes requiring hospitalization. Seen by Dr Lovena Le and dofetilide was recommended. Patient is on warfarin for a CHADS2VASC score of 2.  Patient presents today for dofetilide admission. INR this AM is 2.0. she has discontinued propafenone and weaned to low dose sertraline. She remains symptomatic with fatigue, SOB, and palpitations.   Today, she denies symptoms of chest pain, orthopnea, PND, lower extremity edema, dizziness, presyncope, syncope, snoring, daytime somnolence, bleeding, or neurologic sequela. The patient is tolerating medications without difficulties and is otherwise without complaint today.    Atrial Fibrillation Risk Factors:  she does not have symptoms or diagnosis of sleep apnea. she does not have a history of rheumatic fever.   she has a BMI of Body mass index is 18.98 kg/m.Marland Kitchen Filed Weights   07/14/22 1021  Weight: 50.2 kg    Family History  Problem Relation Age of Onset   Suicidality Mother    Mental illness Mother    Drug abuse Brother    Healthy Daughter    Healthy Son      Atrial Fibrillation Management history:  Previous antiarrhythmic drugs: propafenone  Previous cardioversions: 08/29/19 Previous ablations: MAZE 2016 CHADS2VASC score: 2 Anticoagulation history: warfarin   Past Medical History:  Diagnosis Date   Aortic stenosis    ATRIAL ARRHYTHMIAS    Bicuspid aortic valve    CHF (congestive heart failure) (HCC)    COARCTATION OF AORTA    ENDOMETRIOSIS     Heart murmur    Paroxysmal atrial fibrillation (HCC)    Shortness of breath    THORACIC AORTIC ANEURYSM    Vasculitis ([place])    Past Surgical History:  Procedure Laterality Date   CARDIOVERSION N/A 08/29/2019   Procedure: CARDIOVERSION;  Surgeon: Arnoldo Lenis, MD;  Location: [facility];  Service: Endoscopy;  Laterality: N/A;   coarctation repair and residual restenosis     KNEE SURGERY     LYMPH NODE BIOPSY Right 06/25/2022   Procedure: LYMPH NODE BIOPSY; right groin;  Surgeon: Virl Cagey, MD;  Location: [facility];  Service: General;  Laterality: Right;   TEE WITHOUT CARDIOVERSION N/A 07/12/2019   Procedure: TRANSESOPHAGEAL ECHOCARDIOGRAM (TEE);  Surgeon: Sueanne Margarita, MD;  [facility] ENDOSCOPY;  Service: Cardiovascular;  Laterality: N/A;   TEE WITHOUT CARDIOVERSION N/A 08/29/2019   Procedure: TRANSESOPHAGEAL ECHOCARDIOGRAM (TEE) WITH PROPOFOL;  Surgeon: Arnoldo Lenis, MD;  Location: [facility];  Service: Endoscopy;  Laterality: N/A;    Current Outpatient Medications  Medication Sig Dispense Refill   acetaminophen (TYLENOL) 325 MG tablet Take 2 tablets (650 mg total) by mouth every 6 (six) hours as needed for mild pain (or Fever >/= 101). 100 tablet 1   albuterol (VENTOLIN HFA) 108 (90 Base) MCG/ACT inhaler Inhale 2 puffs into the lungs every 4 (four) hours as needed for wheezing or shortness of breath. 54 g 6   ALPRAZolam (XANAX) 0.5 MG tablet Take 0.5 mg by mouth 3 (three) times daily.     aspirin EC 81 MG tablet Take 1 tablet (81 mg total) by  mouth daily with breakfast. 30 tablet 11   Budeson-Glycopyrrol-Formoterol (BREZTRI AEROSPHERE) 160-9-4.8 MCG/ACT AERO Inhale 2 puffs into the lungs 2 (two) times daily. 10.7 g 1   calcium-vitamin D (OSCAL WITH D) 500-5 MG-MCG tablet Take 1 tablet by mouth daily. 30 tablet 2   feeding supplement (ENSURE ENLIVE / ENSURE PLUS) LIQD Take 237 mLs by mouth 2 (two) times daily between meals.     furosemide (LASIX) 40 MG tablet Take 2 tablets  (80 mg total) by mouth daily. 30 tablet 3   gabapentin (NEURONTIN) 300 MG capsule Take 300-600 mg by mouth See admin instructions. Take 300 mg in the morning, 300 mg in the afternoon, and 600 mg at bedtime     HYDROcodone-acetaminophen (NORCO/VICODIN) 5-325 MG tablet Take 2 tablets by mouth 2 (two) times daily.     ipratropium-albuterol (DUONEB) 0.5-2.5 (3) MG/3ML SOLN Inhale 3 mLs into the lungs 2 (two) times daily as needed (shortness of breath). 360 mL 1   magnesium oxide (MAGOX 400) 400 (240 Mg) MG tablet Take 1 tablet (400 mg total) by mouth daily. 30 tablet 2   metoprolol succinate (TOPROL-XL) 50 MG 24 hr tablet Take 1 tablet (50 mg total) by mouth daily. Take with or immediately following a meal. 30 tablet 11   Multiple Vitamin (MULTI-VITAMINS) TABS Take 1 tablet by mouth daily.      ondansetron (ZOFRAN ODT) 4 MG disintegrating tablet Take 1 tablet (4 mg total) by mouth every 8 (eight) hours as needed for nausea or vomiting. 20 tablet 0   pantoprazole (PROTONIX) 40 MG tablet Take 1 tablet (40 mg total) by mouth daily. 30 tablet 2   potassium chloride SA (KLOR-CON M) 20 MEQ tablet Take 1 tablet (20 mEq total) by mouth 2 (two) times daily for 7 days, THEN 1 tablet (20 mEq total) daily for 7 days. Take 3 tablets daily when taking lasix. 90 tablet 3   predniSONE (DELTASONE) 20 MG tablet Take 1 tablet (20 mg total) by mouth daily with breakfast. 90 tablet 3   sertraline (ZOLOFT) 100 MG tablet Take 1 tablet (100 mg total) by mouth daily. (Patient taking differently: Take 25 mg by mouth daily.) 30 tablet 3   warfarin (COUMADIN) 1 MG tablet Take 3 tablets daily except 4 tablets on Fridays or as directed by coumadin clinic 100 tablet 5   zolpidem (AMBIEN) 10 MG tablet Take 1 tablet (10 mg total) by mouth at bedtime as needed for sleep. 10 tablet 0   No current facility-administered medications for this encounter.    Allergies  Allergen Reactions   Amiodarone Nausea Only   Penicillins Hives    Has  patient had a PCN reaction causing immediate rash, facial/tongue/throat swelling, SOB or lightheadedness with hypotension: YES Has patient had a PCN reaction causing severe rash involving mucus membranes or skin necrosis: NO Has patient had a PCN reaction that required hospitalization NO Has patient had a PCN reaction occurring within the last 10 years: NO If all of the above answers are "NO", then may proceed with Cephalosporin use.     Social History   Socioeconomic History   Marital status: Married    Spouse name: Not on file   Number of children: Not on file   Years of education: Not on file   Highest education level: Not on file  Occupational History   Not on file  Tobacco Use   Smoking status: Never    Passive exposure: Past   Smokeless tobacco: Never  Tobacco comments:    Never smoke 07/14/22  Vaping Use   Vaping Use: Never used  Substance and Sexual Activity   Alcohol use: Yes    Comment: 3 beers 3 nights a week 07/14/22   Drug use: No   Sexual activity: Never  Other Topics Concern   Not on file  Social History Narrative   Not on file   Social Determinants of Health   Financial Resource Strain: High Risk (07/02/2022)   Overall Financial Resource Strain (CARDIA)    Difficulty of Paying Living Expenses: Very hard  Food Insecurity: Food Insecurity Present (06/24/2022)   Hunger Vital Sign    Worried About Running Out of Food in the Last Year: Sometimes true    Ran Out of Food in the Last Year: Sometimes true  Transportation Needs: No Transportation Needs (06/24/2022)   PRAPARE - Hydrologist (Medical): No    Lack of Transportation (Non-Medical): No  Physical Activity: Not on file  Stress: Not on file  Social Connections: Not on file  Intimate Partner Violence: Not At Risk (06/24/2022)   Humiliation, Afraid, Rape, and Kick questionnaire    Fear of Current or Ex-Partner: No    Emotionally Abused: No    Physically Abused: No     Sexually Abused: No     ROS- All systems are reviewed and negative except as per the HPI above.  Physical Exam: Vitals:   07/14/22 1021  BP: 122/84  Pulse: (!) 143  Weight: 50.2 kg  Height: '5\' 4"'$  (1.626 m)    GEN- The patient is a well appearing female, alert and oriented x 3 today.   Head- normocephalic, atraumatic Eyes-  Sclera clear, conjunctiva pink Ears- hearing intact Oropharynx- clear Neck- supple  Lungs- Clear to ausculation bilaterally, normal work of breathing Heart- irregular rate and rhythm, no rubs or gallops, mech click, 3/6 systolic murmur  GI- soft, NT, ND, + BS Extremities- no clubbing, cyanosis, or edema MS- no significant deformity or atrophy Skin- no rash or lesion Psych- euthymic mood, full affect Neuro- strength and sensation are intact  Wt Readings from Last 3 Encounters:  07/14/22 50.2 kg  07/08/22 48.1 kg  07/06/22 47 kg    EKG today demonstrates  Afib with RVR Vent. rate 143 BPM PR interval * ms QRS duration 78 ms QT/QTcB 312/481 ms  Echo 05/21/22 demonstrated   1. Left ventricular ejection fraction, by estimation, is 60 to 65%. The  left ventricle has normal function. The left ventricle has no regional  wall motion abnormalities. There is moderate left ventricular hypertrophy.   2. Right ventricular systolic function is normal. The right ventricular  size is normal.   3. Agitated saline contrast bubble study was negative, with no evidence  of any interatrial shunt.   4. Limited echo with bubble study to evaluate for intracardiac shunting   Epic records are reviewed at length today  CHA2DS2-VASc Score = 3  The patient's score is based upon: CHF History: 1 HTN History: 1 Diabetes History: 0 Stroke History: 0 Vascular Disease History: 0 Age Score: 0 Gender Score: 1        ASSESSMENT AND PLAN: 1. Persistent Atrial Fibrillation (ICD10:  I48.19) The patient's CHA2DS2-VASc score is 3, indicating a 3.2% annual risk of stroke.    S/p MAZE and LAA occlusion 2016 Patient presents for dofetilide admission. Continue warfarin. INR today therapeutic. She was bridged on lovenox when her INR was subtherapeutic.  No recent benadryl use  PharmD has screened medications QTc in SR ~460 ms Labs today show creatinine at 1.04, K+ 3.7 and mag 2.4, CrCl calculated at 46 mL/min. INR 2.0.  2. Secondary Hypercoagulable State (ICD10:  D68.69) The patient is at significant risk for stroke/thromboembolism based upon her CHA2DS2-VASc Score of 3.  Continue Warfarin (Coumadin).   3. Bicuspid AV  S/p AVR 2016, mechanical valve On warfarin  4. HTN Stable, no changes today.  5. Chronic diastolic CHF Fluid status appears stable.  6. Lymphoma  Stage III follicular lymphoma  Followed by Dr Delton Coombes    To be admitted later today once a bed becomes available.    [facility] [address] [place], [place] [address] [phone] 07/14/2022 10:34 AM

## 2022-07-14 NOTE — H&P (Addendum)
Primary Care Physician: Valentino Nose, FNP Primary Cardiologist: Dr [name] Primary Electrophysiologist: Dr Lovena Le  Referring Physician: Dr Gray Bernhardt [name] is a 62 y.o. female with a history of coarctation repair at age 48, bicuspid AV s/p AVR 2016 with MAZE and LAA occlusion, chronic diastolic CHF, COPD, lymphoma, atrial fibrillation who presents for follow up in the [facility]. Mallory Sutton has been maintained on propafenone but was having more breakthrough episodes requiring hospitalization. Seen by Dr Lovena Le and dofetilide was recommended. Mallory Sutton is on warfarin for a CHADS2VASC score of 2.  Mallory Sutton presents today for dofetilide admission. INR this AM is 2.0. Mallory Sutton has discontinued propafenone and weaned to low dose sertraline. Mallory Sutton remains symptomatic with fatigue, SOB, and palpitations.   Today, Mallory Sutton denies symptoms of chest pain, orthopnea, PND, lower extremity edema, dizziness, presyncope, syncope, snoring, daytime somnolence, bleeding, or neurologic sequela. The Mallory Sutton is tolerating medications without difficulties and is otherwise without complaint today.    Atrial Fibrillation Risk Factors:  Mallory Sutton does not have symptoms or diagnosis of sleep apnea. Mallory Sutton does not have a history of rheumatic fever.   Mallory Sutton has a BMI of 18.98. Weight 50.2 kg   Family History  Problem Relation Age of Onset   Suicidality Mother    Mental illness Mother    Drug abuse Brother    Healthy Daughter    Healthy Son      Atrial Fibrillation Management history:  Previous antiarrhythmic drugs: propafenone  Previous cardioversions: 08/29/19 Previous ablations: MAZE 2016 CHADS2VASC score: 2 Anticoagulation history: warfarin   Past Medical History:  Diagnosis Date   Aortic stenosis    ATRIAL ARRHYTHMIAS    Bicuspid aortic valve    CHF (congestive heart failure) (HCC)    COARCTATION OF AORTA    ENDOMETRIOSIS    Heart murmur    Paroxysmal atrial fibrillation (HCC)     Shortness of breath    THORACIC AORTIC ANEURYSM    Vasculitis ([place])    Past Surgical History:  Procedure Laterality Date   CARDIOVERSION N/A 08/29/2019   Procedure: CARDIOVERSION;  Surgeon: Arnoldo Lenis, MD;  Location: [facility];  Service: Endoscopy;  Laterality: N/A;   coarctation repair and residual restenosis     KNEE SURGERY     LYMPH NODE BIOPSY Right 06/25/2022   Procedure: LYMPH NODE BIOPSY; right groin;  Surgeon: Virl Cagey, MD;  Location: [facility];  Service: General;  Laterality: Right;   TEE WITHOUT CARDIOVERSION N/A 07/12/2019   Procedure: TRANSESOPHAGEAL ECHOCARDIOGRAM (TEE);  Surgeon: Sueanne Margarita, MD;  Location: [facility] ENDOSCOPY;  Service: Cardiovascular;  Laterality: N/A;   TEE WITHOUT CARDIOVERSION N/A 08/29/2019   Procedure: TRANSESOPHAGEAL ECHOCARDIOGRAM (TEE) WITH PROPOFOL;  Surgeon: Arnoldo Lenis, MD;  Location: [facility];  Service: Endoscopy;  Laterality: N/A;    Current Facility-Administered Medications  Medication Dose Route Frequency Provider Last Rate Last Admin   0.9 %  sodium chloride infusion  250 mL Intravenous PRN Fenton, Clint R, PA       acetaminophen (TYLENOL) tablet 650 mg  650 mg Oral Q6H PRN Fenton, Clint R, PA       albuterol (VENTOLIN HFA) 108 (90 Base) MCG/ACT inhaler 2 puff  2 puff Inhalation Q4H PRN Fenton, Clint R, PA       [START ON 07/15/2022] aspirin EC tablet 81 mg  81 mg Oral Q breakfast Fenton, Clint R, PA       Budeson-Glycopyrrol-Formoterol 160-9-4.8 MCG/ACT AERO 2 puff  2  puff Inhalation BID Fenton, [place] R, [place]       [START ON 07/15/2022] calcium-vitamin D (OSCAL WITH D) 500-5 MG-MCG per tablet 1 tablet  1 tablet Oral Daily Fenton, Clint R, PA       dofetilide (TIKOSYN) capsule 250 mcg  250 mcg Oral BID Fenton, Clint R, PA       [START ON 07/15/2022] feeding supplement (ENSURE ENLIVE / ENSURE PLUS) liquid 237 mL  237 mL Oral BID BM Fenton, Clint R, PA       [START ON 07/15/2022] furosemide (LASIX) tablet 80 mg  80 mg Oral Daily Fenton,  Clint R, PA       ipratropium-albuterol (DUONEB) 0.5-2.5 (3) MG/3ML nebulizer solution 3 mL  3 mL Inhalation BID PRN Fenton, Clint R, PA       [START ON 07/15/2022] magnesium oxide (MAG-OX) tablet 400 mg  400 mg Oral Daily Fenton, Clint R, PA       [START ON 07/15/2022] metoprolol succinate (TOPROL-XL) 24 hr tablet 50 mg  50 mg Oral Daily Fenton, Clint R, PA       [START ON 07/15/2022] Multi-Vitamins TABS 1 tablet  1 tablet Oral Daily Fenton, Clint R, PA       [START ON 07/15/2022] pantoprazole (PROTONIX) EC tablet 40 mg  40 mg Oral Daily Fenton, Clint R, PA       potassium chloride SA (KLOR-CON M) CR tablet 20 mEq  20 mEq Oral TID Fenton, Clint R, PA       [START ON 07/15/2022] predniSONE (DELTASONE) tablet 20 mg  20 mg Oral Q breakfast Fenton, Clint R, PA       [START ON 07/15/2022] sertraline (ZOLOFT) tablet 25 mg  25 mg Oral Daily Fenton, Clint R, PA       sodium chloride flush (NS) 0.9 % injection 3 mL  3 mL Intravenous Q12H Fenton, Clint R, PA       sodium chloride flush (NS) 0.9 % injection 3 mL  3 mL Intravenous PRN Fenton, Clint R, PA       warfarin (COUMADIN) tablet 1 mg  1 mg Oral TID Fenton, Clint R, PA        Allergies  Allergen Reactions   Amiodarone Nausea Only   Penicillins Hives    Has Mallory Sutton had a PCN reaction causing immediate rash, facial/tongue/throat swelling, SOB or lightheadedness with hypotension: YES Has Mallory Sutton had a PCN reaction causing severe rash involving mucus membranes or skin necrosis: NO Has Mallory Sutton had a PCN reaction that required hospitalization NO Has Mallory Sutton had a PCN reaction occurring within the last 10 years: NO If all of the above answers are "NO", then may proceed with Cephalosporin use.     Social History   Socioeconomic History   Marital status: Married    Spouse name: Not on file   Number of children: Not on file   Years of education: Not on file   Highest education level: Not on file  Occupational History   Not on file  Tobacco Use    Smoking status: Never    Passive exposure: Past   Smokeless tobacco: Never   Tobacco comments:    Never smoke 07/14/22  Vaping Use   Vaping Use: Never used  Substance and Sexual Activity   Alcohol use: Yes    Comment: 3 beers 3 nights a week 07/14/22   Drug use: No   Sexual activity: Never  Other Topics Concern   Not on file  Social History Narrative  Not on file   Social Determinants of Health   Financial Resource Strain: High Risk (07/02/2022)   Overall Financial Resource Strain (CARDIA)    Difficulty of Paying Living Expenses: Very hard  Food Insecurity: Food Insecurity Present (06/24/2022)   Hunger Vital Sign    Worried About Running Out of Food in the Last Year: Sometimes true    Ran Out of Food in the Last Year: Sometimes true  Transportation Needs: No Transportation Needs (06/24/2022)   PRAPARE - Hydrologist (Medical): No    Lack of Transportation (Non-Medical): No  Physical Activity: Not on file  Stress: Not on file  Social Connections: Not on file  Intimate Partner Violence: Not At Risk (06/24/2022)   Humiliation, Afraid, Rape, and Kick questionnaire    Fear of Current or Ex-Partner: No    Emotionally Abused: No    Physically Abused: No    Sexually Abused: No     ROS- All systems are reviewed and negative except as per the HPI above.  Physical Exam: Vitals:    07/14/22 1021  BP: 122/84  Pulse: (!) 143  Weight: 50.2 kg  Height: '5\' 4"'$  (1.626 m)     GEN- The Mallory Sutton is a well appearing female, alert and oriented x 3 today.   Head- normocephalic, atraumatic Eyes-  Sclera clear, conjunctiva pink Ears- hearing intact Oropharynx- clear Neck- supple  Lungs- Clear to ausculation bilaterally, normal work of breathing Heart- irregular rate and rhythm, no rubs or gallops, mech click, 3/6 systolic murmur  GI- soft, NT, ND, + BS Extremities- no clubbing, cyanosis, or edema MS- no significant deformity or atrophy Skin- no rash or  lesion Psych- euthymic mood, full affect Neuro- strength and sensation are intact  Wt Readings from Last 3 Encounters:  07/14/22 50.2 kg  07/08/22 48.1 kg  07/06/22 47 kg    EKG today demonstrates  Afib with RVR Vent. rate 143 BPM PR interval * ms QRS duration 78 ms QT/QTcB 312/481 ms  Echo 05/21/22 demonstrated   1. Left ventricular ejection fraction, by estimation, is 60 to 65%. The  left ventricle has normal function. The left ventricle has no regional  wall motion abnormalities. There is moderate left ventricular hypertrophy.   2. Right ventricular systolic function is normal. The right ventricular  size is normal.   3. Agitated saline contrast bubble study was negative, with no evidence  of any interatrial shunt.   4. Limited echo with bubble study to evaluate for intracardiac shunting   Epic records are reviewed at length today  CHA2DS2-VASc Score = 3  The Mallory Sutton's score is based upon: CHF History: 1 HTN History: 1 Diabetes History: 0 Stroke History: 0 Vascular Disease History: 0 Age Score: 0 Gender Score: 1      ASSESSMENT AND PLAN: 1. Persistent Atrial Fibrillation (ICD10:  I48.19) The Mallory Sutton's CHA2DS2-VASc score is 3, indicating a 3.2% annual risk of stroke.   S/p MAZE and LAA occlusion 2016 Mallory Sutton presents for dofetilide admission. Continue warfarin. INR today therapeutic. Mallory Sutton was bridged on lovenox when her INR was subtherapeutic.  No recent benadryl use PharmD has screened medications QTc in SR ~460 ms Labs today show creatinine at 1.04, K+ 3.7 and mag 2.4, CrCl calculated at 46 mL/min. INR 2.0.  2. Secondary Hypercoagulable State (ICD10:  D68.69) The Mallory Sutton is at significant risk for stroke/thromboembolism based upon her CHA2DS2-VASc Score of 3.  Continue Warfarin (Coumadin).   3. Bicuspid AV  S/p AVR 2016, mechanical  valve On warfarin  4. HTN Stable, no changes today.  5. Chronic diastolic CHF Fluid status appears stable.  6. Lymphoma   Stage III follicular lymphoma  Followed by Dr Delton Coombes    Mallory Sutton presents today for planned Tikosyn loading  Legrand Como "Oda Kilts, PA-C  07/14/2022 3:57 PM   I have seen and examined this Mallory Sutton with Oda Kilts.  Agree with above, note added to reflect my findings.  Mallory Sutton with a history of atrial fibrillation post MAZE and left atrial appendage clipping. Feeling poorly in atrial fibrillation. Has failed rhythmol. Admission today for tikosyn.  GEN: Well nourished, well developed, in no acute distress  HEENT: normal  Neck: no JVD, carotid bruits, or masses Cardiac: irregular; no murmurs, rubs, or gallops,no edema  Respiratory:  clear to auscultation bilaterally, normal work of breathing GI: soft, nontender, nondistended, + BS MS: no deformity or atrophy  Skin: warm and dry Neuro:  Strength and sensation are intact Psych: euthymic mood, full affect   Persistent atrial fibrillation: admission for tikosyn load. QTc acceptable. Landyn Lorincz plan to initiate tikosyn today. ECG after each dose.    Naavya Postma M. Deitrich Steve MD 07/14/2022 6:32 PM

## 2022-07-14 NOTE — Care Management (Signed)
  Transition of Care [facility]) Screening Note   Patient Details  Name: Mallory Sutton Date of Birth: 11/15/1960   Transition of Care [facility]) CM/SW Contact:    Bethena Roys, RN Phone Number: 07/14/2022, 4:06 PM    Transition of Care Department [facility]) has reviewed the patient. Patient presented for Tikosyn Load. Benefits check submitted for cost. Case Manager will discuss cost and pharmacy of choice as the patient progresses.

## 2022-07-14 NOTE — Patient Instructions (Signed)
Take warfarin '4mg'$  today then continue '3mg'$  daily except '4mg'$  on Fridays. Recheck INR 1 wk

## 2022-07-14 NOTE — Progress Notes (Signed)
Pharmacy: Dofetilide (Tikosyn) - Initial Consult Assessment and Electrolyte Replacement  Pharmacy consulted to assist in monitoring and replacing electrolytes in this 62 y.o. female admitted on 07/14/2022 undergoing dofetilide initiation. First dofetilide dose: 3/12 2230  Assessment:  Patient Exclusion Criteria: If any screening criteria checked as "Yes", then  patient  should NOT receive dofetilide until criteria item is corrected.  If "Yes" please indicate correction plan.  YES  NO Patient  Exclusion Criteria Correction Plan   '[x]'$   '[]'$   Baseline QTc interval is greater than or equal to 440 msec. IF above YES box checked dofetilide contraindicated unless patient has ICD; then may proceed if QTc 500-550 msec or with known ventricular conduction abnormalities may proceed with QTc 550-600 msec. QTc = QTc in SR ~460 ms (MD aware)     '[]'$   '[x]'$   Patient is known or suspected to have a digoxin level greater than 2 ng/ml: No results found for: "DIGOXIN"     '[]'$   '[x]'$   Creatinine clearance less than 20 ml/min (calculated using Cockcroft-Gault, actual body weight and serum creatinine): Estimated Creatinine Clearance: 45.6 mL/min (A) (by C-G formula based on SCr of 1.04 mg/dL (H)).     '[]'$   '[x]'$  Patient has received drugs known to prolong the QT intervals within the last 48 hours (phenothiazines, tricyclics or tetracyclic antidepressants, erythromycin, H-1 antihistamines, cisapride, fluoroquinolones, azithromycin, ondansetron).   Updated information on QT prolonging agents is available to be searched on the following database:QT prolonging agents     '[]'$   '[x]'$   Patient received a dose of hydrochlorothiazide (Oretic) alone or in any combination including triamterene (Dyazide, Maxzide) in the last 48 hours.    '[]'$   '[x]'$  Patient received a medication known to increase dofetilide plasma concentrations prior to initial dofetilide dose:  Trimethoprim (Primsol, Proloprim) in the last 36 hours Verapamil  (Calan, Verelan) in the last 36 hours or a sustained release dose in the last 72 hours Megestrol (Megace) in the last 5 days  Cimetidine (Tagamet) in the last 6 hours Ketoconazole (Nizoral) in the last 24 hours Itraconazole (Sporanox) in the last 48 hours  Prochlorperazine (Compazine) in the last 36 hours     '[]'$   '[x]'$   Patient is known to have a history of torsades de pointes; congenital or acquired long QT syndromes.    '[]'$   '[x]'$   Patient has received a Class 1 antiarrhythmic with less than 2 half-lives since last dose. (Disopyramide, Quinidine, Procainamide, Lidocaine, Mexiletine, Flecainide, Propafenone)    '[]'$   '[x]'$   Patient has received amiodarone therapy in the past 3 months or amiodarone level is greater than 0.3 ng/ml.    Labs:    Component Value Date/Time   K 3.7 07/14/2022 1010   MG 2.4 07/14/2022 1010     Plan: Select One Calculated CrCl  Dose q12h  '[]'$  > 60 ml/min 500 mcg  '[x]'$  40-60 ml/min 250 mcg  '[]'$  20-40 ml/min 125 mcg   '[x]'$   Physician selected initial dose within range recommended for patients level of renal function - will monitor for response.  '[]'$   Physician selected initial dose outside of range recommended for patients level of renal function - will discuss if the dose should be altered at this time.   Patient has been appropriately anticoagulated with Warfarin. INR 2.  Potassium: K 3.5-3.7:  Hold Tikosyn initiation and give KCl 60 mEq po x1 and repeat BMET 2hr after dose - repeat appropriate dose if K < 4    Magnesium: Mg >2: Appropriate to  initiate Tikosyn, no replacement needed     Thank you for allowing pharmacy to participate in this patient's care   Sherlon Handing, PharmD, BCPS Please see amion for complete clinical pharmacist phone list 07/14/2022  3:54 PM  ADDENDUM (2030)  Potassium: K 3.8-3.9:  Hold Tikosyn initiation and give KCl 40 mEq po x1 then begin Tikosyn at least 2hr after KCl dose - do not need to recheck K    Thank you for  allowing pharmacy to participate in this patient's care   Sherlon Handing, PharmD, BCPS Please see amion for complete clinical pharmacist phone list 07/14/2022  8:30 PM

## 2022-07-15 ENCOUNTER — Encounter: Payer: Self-pay | Admitting: Hematology

## 2022-07-15 ENCOUNTER — Other Ambulatory Visit (HOSPITAL_COMMUNITY): Payer: Self-pay

## 2022-07-15 ENCOUNTER — Other Ambulatory Visit: Payer: Self-pay

## 2022-07-15 ENCOUNTER — Ambulatory Visit: Payer: Medicaid Other | Admitting: Internal Medicine

## 2022-07-15 DIAGNOSIS — I4819 Other persistent atrial fibrillation: Secondary | ICD-10-CM | POA: Diagnosis not present

## 2022-07-15 LAB — BASIC METABOLIC PANEL
Anion gap: 7 (ref 5–15)
BUN: 17 mg/dL (ref 6–20)
CO2: 31 mmol/L (ref 22–32)
Calcium: 8.7 mg/dL — ABNORMAL LOW (ref 8.9–10.3)
Chloride: 99 mmol/L (ref 98–111)
Creatinine, Ser: 0.97 mg/dL (ref 0.44–1.00)
GFR, Estimated: >60 mL/min (ref >60)
Glucose, Bld: 117 mg/dL — ABNORMAL HIGH (ref 70–99)
Potassium: 4.5 mmol/L (ref 3.5–5.1)
Sodium: 137 mmol/L (ref 135–145)

## 2022-07-15 LAB — PROTIME-INR
INR: 1.7 — ABNORMAL HIGH (ref 0.8–1.2)
Prothrombin Time: 19.7 seconds — ABNORMAL HIGH (ref 11.4–15.2)

## 2022-07-15 LAB — HEPARIN LEVEL (UNFRACTIONATED): Heparin Unfractionated: 0.11 IU/mL — ABNORMAL LOW (ref 0.30–0.70)

## 2022-07-15 LAB — MAGNESIUM: Magnesium: 2.4 mg/dL (ref 1.7–2.4)

## 2022-07-15 MED ORDER — HEPARIN (PORCINE) 25000 UT/250ML-% IV SOLN
1000.0000 [IU]/h | INTRAVENOUS | Status: DC
  Administered 2022-07-15: 700 [IU]/h via INTRAVENOUS
  Administered 2022-07-16: 1000 [IU]/h via INTRAVENOUS
  Filled 2022-07-15 (×2): qty 250

## 2022-07-15 MED ORDER — WARFARIN SODIUM 5 MG PO TABS
5.0000 mg | ORAL_TABLET | Freq: Once | ORAL | Status: AC
  Administered 2022-07-15: 5 mg via ORAL
  Filled 2022-07-15: qty 1

## 2022-07-15 MED ORDER — SODIUM CHLORIDE 0.9 % IV SOLN: INTRAVENOUS | Status: DC

## 2022-07-15 NOTE — Progress Notes (Signed)
MEWS Progress Note  Patient Details Name: SAORY VERONA MRN: [id number] DOB: 1961/01/03 Today's Date: 07/15/2022   MEWS Flowsheet Documentation:  Assess: MEWS Score Temp: 98.6 F (37 C) BP: 113/81 MAP (mmHg): 92 Pulse Rate: (!) 140 ECG Heart Rate: (!) 141 Resp: 20 Level of Consciousness: Alert SpO2: 99 % O2 Device: Room Air Patient Activity (if Appropriate): In bed O2 Flow Rate (L/min): 2 L/min Assess: MEWS Score MEWS Temp: 0 MEWS Systolic: 0 MEWS Pulse: 3 MEWS RR: 0 MEWS LOC: 0 MEWS Score: 3 MEWS Score Color: Yellow Assess: SIRS CRITERIA SIRS Temperature : 0 SIRS Respirations : 0 SIRS Pulse: 1 SIRS WBC: 0 SIRS Score Sum : 1 SIRS Temperature : 0 SIRS Pulse: 1 SIRS Respirations : 0 SIRS WBC: 0 SIRS Score Sum : 1 Assess: if the MEWS score is Yellow or Red Were vital signs taken at a resting state?: Yes Focused Assessment: No change from prior assessment Does the patient meet 2 or more of the SIRS criteria?: No Does the patient have a confirmed or suspected source of infection?: No Provider and Rapid Response Notified?: No MEWS guidelines implemented : Yes, yellow Treat MEWS Interventions: Considered administering scheduled or prn medications/treatments as ordered Take Vital Signs Increase Vital Sign Frequency : Yellow: Q2hr x1, continue Q4hrs until patient remains green for 12hrs Escalate MEWS: Escalate: Yellow: Discuss with charge nurse and consider notifying provider and/or RRT Notify: Charge Nurse/RN Name of Charge Nurse/RN Notified: Yoko      Yena Tisby I Doshie Maggi 07/15/2022, 2:18 PM

## 2022-07-15 NOTE — Progress Notes (Signed)
CSW received consult for food resources for patient. CSW spoke with patient at bedside. Patient reports PTA she comes from home with spouse. CSW offered patient food resources. Patient accepted. All questions answered. No further questions reported at this time. SDOH screening complete.

## 2022-07-15 NOTE — Progress Notes (Signed)
Pharmacy: Dofetilide (Tikosyn) - Follow Up Assessment and Electrolyte Replacement  Pharmacy consulted to assist in monitoring and replacing electrolytes in this 62 y.o. female admitted on 07/14/2022 undergoing dofetilide initiation.   Labs:    Component Value Date/Time   K 4.5 07/15/2022 0207   MG 2.4 07/15/2022 0207     Plan: Potassium: K >/= 4: No additional supplementation needed  Magnesium: Mg > 2: No additional supplementation needed   Thank you for allowing pharmacy to participate in this patient's care   Hildred Laser, PharmD Clinical Pharmacist **Pharmacist phone directory can now be found on [email or web address] ([email or web address]).  Listed under [place].

## 2022-07-15 NOTE — Progress Notes (Signed)
ANTICOAGULATION CONSULT NOTE  Pharmacy Consult:  Heparin Indication: Afib and mechanical AVR  Allergies  Allergen Reactions   Amiodarone Nausea Only   Penicillins Hives    Has patient had a PCN reaction causing immediate rash, facial/tongue/throat swelling, SOB or lightheadedness with hypotension: YES Has patient had a PCN reaction causing severe rash involving mucus membranes or skin necrosis: NO Has patient had a PCN reaction that required hospitalization NO Has patient had a PCN reaction occurring within the last 10 years: NO If all of the above answers are "NO", then may proceed with Cephalosporin use.     Patient Measurements:   Heparin Dosing Weight: 50 kg  Vital Signs: Temp: 98.6 F (37 C) (03/13 1310) Temp Source: Oral (03/13 1310) BP: 113/81 (03/13 1310) Pulse Rate: 140 (03/13 1310)  Labs: Recent Labs    07/14/22 0847 07/14/22 1010 07/14/22 1911 07/15/22 0207 07/15/22 1406  LABPROT  --   --  20.5*  --  19.7*  INR 2.0  --  1.8*  --  1.7*  HEPARINUNFRC  --   --   --   --  0.11*  CREATININE  --  1.04* 1.05* 0.97  --     Estimated Creatinine Clearance: 48.9 mL/min (by C-G formula based on SCr of 0.97 mg/dL).    Assessment: 58 YOF admitted for Tikosyn initiation.  Patient has a history of Afib and a mechanical AVR to continue on Coumadin and IV heparin bridge while INR is sub-therapeutic.    Initial heparin level is sub-therapeutic at 0.11 units/mL.  Discussed with RN, no issue with infusion nor bleeding.  Goal of Therapy:  Heparin level 0.3-0.7 units/ml Monitor platelets by anticoagulation protocol: Yes   Plan:  Increase IV heparin to 900 units/hr - no bolus with INR at 1.7 Check 6 hr heparin level  Karrie Fluellen D. Mina Marble, PharmD, BCPS, [place] 07/15/2022, 4:39 PM

## 2022-07-15 NOTE — Progress Notes (Addendum)
[place] for warfarin, heparin  Indication:  afib and mechanical AVR  Allergies  Allergen Reactions   Amiodarone Nausea Only   Penicillins Hives    Has patient had a PCN reaction causing immediate rash, facial/tongue/throat swelling, SOB or lightheadedness with hypotension: YES Has patient had a PCN reaction causing severe rash involving mucus membranes or skin necrosis: NO Has patient had a PCN reaction that required hospitalization NO Has patient had a PCN reaction occurring within the last 10 years: NO If all of the above answers are "NO", then may proceed with Cephalosporin use.     Patient Measurements:    Vital Signs: Temp: 97.7 F (36.5 C) (03/13 0606) Temp Source: Oral (03/13 0606) BP: 110/70 (03/13 0606) Pulse Rate: 121 (03/13 0606)  Labs: Recent Labs    07/14/22 0847 07/14/22 1010 07/14/22 1911 07/15/22 0207  LABPROT  --   --  20.5*  --   INR 2.0  --  1.8*  --   CREATININE  --  1.04* 1.05* 0.97     Estimated Creatinine Clearance: 48.9 mL/min (by C-G formula based on SCr of 0.97 mg/dL).   Medical History: Past Medical History:  Diagnosis Date   Aortic stenosis    ATRIAL ARRHYTHMIAS    Bicuspid aortic valve    CHF (congestive heart failure) (HCC)    COARCTATION OF AORTA    ENDOMETRIOSIS    Heart murmur    Paroxysmal atrial fibrillation (HCC)    Shortness of breath    THORACIC AORTIC ANEURYSM    Vasculitis (HCC)     Medications:  Medications Prior to Admission  Medication Sig Dispense Refill Last Dose   acetaminophen (TYLENOL) 325 MG tablet Take 2 tablets (650 mg total) by mouth every 6 (six) hours as needed for mild pain (or Fever >/= 101). 100 tablet 1 Past Month   albuterol (VENTOLIN HFA) 108 (90 Base) MCG/ACT inhaler Inhale 2 puffs into the lungs every 4 (four) hours as needed for wheezing or shortness of breath. 54 g 6 07/12/2022   ALPRAZolam (XANAX) 0.5 MG tablet Take 0.5 mg by mouth 3 (three) times  daily.   07/14/2022   aspirin EC 81 MG tablet Take 1 tablet (81 mg total) by mouth daily with breakfast. 30 tablet 11 07/14/2022   Budeson-Glycopyrrol-Formoterol (BREZTRI AEROSPHERE) 160-9-4.8 MCG/ACT AERO Inhale 2 puffs into the lungs 2 (two) times daily. 10.7 g 1 07/14/2022   calcium-vitamin D (OSCAL WITH D) 500-5 MG-MCG tablet Take 1 tablet by mouth daily. 30 tablet 2 07/14/2022   feeding supplement (ENSURE ENLIVE / ENSURE PLUS) LIQD Take 237 mLs by mouth 2 (two) times daily between meals.   07/13/2022   furosemide (LASIX) 40 MG tablet Take 2 tablets (80 mg total) by mouth daily. 30 tablet 3 07/13/2022   gabapentin (NEURONTIN) 300 MG capsule Take 300-600 mg by mouth See admin instructions. Take 300 mg tablet by mouth in the morning, 300 mg tablet by mouth in the afternoon, and then take two of the 300 mg tablets by mouth for total of 600 mg in the evening per patient   07/14/2022   HYDROcodone-acetaminophen (NORCO/VICODIN) 5-325 MG tablet Take 2 tablets by mouth 2 (two) times daily.   07/14/2022   ipratropium-albuterol (DUONEB) 0.5-2.5 (3) MG/3ML SOLN Inhale 3 mLs into the lungs 2 (two) times daily as needed (shortness of breath). 360 mL 1 07/14/2022   magnesium oxide (MAGOX 400) 400 (240 Mg) MG tablet Take 1 tablet (400 mg total) by  mouth daily. 30 tablet 2 07/14/2022   metoprolol succinate (TOPROL-XL) 50 MG 24 hr tablet Take 1 tablet (50 mg total) by mouth daily. Take with or immediately following a meal. 30 tablet 11 07/14/2022 at 1000   Multiple Vitamin (MULTI-VITAMINS) TABS Take 1 tablet by mouth daily.    07/14/2022   pantoprazole (PROTONIX) 40 MG tablet Take 1 tablet (40 mg total) by mouth daily. 30 tablet 2 07/14/2022   potassium chloride SA (KLOR-CON M) 20 MEQ tablet Take 20 mEq by mouth 3 (three) times daily.   07/14/2022   predniSONE (DELTASONE) 20 MG tablet Take 1 tablet (20 mg total) by mouth daily with breakfast. 90 tablet 3 07/14/2022   sertraline (ZOLOFT) 25 MG tablet Take 1 tablet (25 mg total)  by mouth daily.   07/14/2022   warfarin (COUMADIN) 1 MG tablet Take 3 tablets daily except 4 tablets on Fridays or as directed by coumadin clinic (Patient taking differently: Take 1-4 mg by mouth See admin instructions. Take 3 tablets every day except on Fridays take four of the 1 mg tablets to make full dose of 4 mg or as directed by coumadin clinic) 100 tablet 5 07/13/2022 at 2100   zolpidem (AMBIEN) 10 MG tablet Take 1 tablet (10 mg total) by mouth at bedtime as needed for sleep. (Patient taking differently: Take 5 mg by mouth at bedtime as needed for sleep.) 10 tablet 0 07/13/2022   potassium chloride SA (KLOR-CON M) 20 MEQ tablet Take 1 tablet (20 mEq total) by mouth 2 (two) times daily for 7 days, THEN 1 tablet (20 mEq total) daily for 7 days. Take 3 tablets daily when taking lasix. (Patient not taking: Reported on 07/14/2022) 90 tablet 3 Not Taking    Assessment: 62 y.o. F presents for dofetilide initiation. Pt on warfarin PTA for afib and mechancial AVR. Admission INR 2. -INR= 1.8  With plans for cardioversion and INR 1.8 adding heparin today  Home warfarin dose: '3mg'$  daily except for '4mg'$  on Friday  Goal of Therapy:  INR 2-3 (per outpt [facility] clinic notes) Monitor platelets by anticoagulation protocol: Yes   Plan:  Warfarin '5mg'$  tonight Daily INR Start heparin at 700 units/hr Heparin level in 6 hours and daily wth CBC daily   Hildred Laser, PharmD Clinical Pharmacist **Pharmacist phone directory can now be found on [email or web address] (PW [email or web address]).  Listed under [place].

## 2022-07-15 NOTE — Progress Notes (Addendum)
Electrophysiology Rounding Note  Patient Name: Mallory Sutton Date of Encounter: 07/15/2022  Primary Cardiologist: Jenkins Rouge, MD  Electrophysiologist: None    Subjective   Pt remains in afib on Tikosyn 250 mcg BID   QTc from EKG last pm shows stable QTc at 460 by Fridericia calculation (corrected for tachycardia)  The patient is doing well today.  At this time, the patient denies chest pain, shortness of breath, or any new concerns.  Inpatient Medications    Scheduled Meds:  ALPRAZolam  0.5 mg Oral TID   aspirin EC  81 mg Oral Q breakfast   calcium-vitamin D  1 tablet Oral Daily   dofetilide  250 mcg Oral BID   feeding supplement  237 mL Oral BID BM   furosemide  80 mg Oral Daily   gabapentin  300 mg Oral BID   gabapentin  600 mg Oral QHS   magnesium oxide  400 mg Oral Daily   metoprolol succinate  50 mg Oral Daily   mometasone-formoterol  2 puff Inhalation BID   And   umeclidinium bromide  1 puff Inhalation Daily   multivitamin with minerals  1 tablet Oral Daily   pantoprazole  40 mg Oral Daily   potassium chloride SA  20 mEq Oral TID   predniSONE  20 mg Oral Q breakfast   sertraline  25 mg Oral Daily   sodium chloride flush  3 mL Intravenous Q12H   Warfarin - Pharmacist Dosing Inpatient   Does not apply q1600   Continuous Infusions:  sodium chloride     PRN Meds: sodium chloride, acetaminophen, albuterol, HYDROcodone-acetaminophen, ipratropium-albuterol, sodium chloride flush, zolpidem   Vital Signs    Vitals:   07/14/22 1735 07/14/22 2003 07/15/22 0022 07/15/22 0606  BP: (!) 121/94 116/77 113/85 110/70  Pulse: (!) 151 (!) 133 (!) 129 (!) 121  Resp:  '18 19 12  '$ Temp:  98 F (36.7 C) 98 F (36.7 C) 97.7 F (36.5 C)  TempSrc:  Oral Oral Oral  SpO2:  100% 100% 100%   No intake or output data in the 24 hours ending 07/15/22 0755 There were no vitals filed for this visit.  Physical Exam    GEN- NAD, A&O x 3. Normal affect.  Lungs- CTAB, Normal  effort.  Heart- Irregularly irregular rate and rhythm. No M/G/R GI- Soft, NT, ND Extremities- No clubbing, cyanosis, or edema Skin- no rash or lesion  Labs    CBC No results for input(s): "WBC", "NEUTROABS", "HGB", "HCT", "MCV", "PLT" in the last 72 hours. Basic Metabolic Panel Recent Labs    07/14/22 1010 07/14/22 1911 07/15/22 0207  NA 135 135 137  K 3.7 3.9 4.5  CL 94* 92* 99  CO2 27 33* 31  GLUCOSE 141* 150* 117*  BUN '15 13 17  '$ CREATININE 1.04* 1.05* 0.97  CALCIUM 9.0 9.2 8.7*  MG 2.4  --  2.4    Telemetry    AF with RVR 110-140s (personally reviewed)  Patient Profile     Mallory Sutton is a 62 y.o. female with a past medical history significant for persistent atrial fibrillation.  They were admitted for tikosyn load.   Assessment & Plan    Persistent atrial fibrillation Pt remains in afib on Tikosyn 250 mcg BID  Continue Coumadin Creatinine, ser  0.97 (03/13 0207) Magnesium  2.4 (03/13 0207) Potassium4.5 (03/13 0207) No electrolyte supplementation needed  2. Mechanical Aortic Valve INR 1.8. Will cover with heparin for cardioversion so that  it is not cancelled.  Will discuss with Dr. Lovena Le if we will need TEE for completeness.    For questions or updates, please contact [place] Please consult [email or web address] for contact info under Cardiology/STEMI.  Signed, Shirley Friar, PA-C  07/15/2022, 7:55 AM   EP Attending  Patient seen and examined. Agree with above. The patient remains stable. She will undergo DCCV in the morning. Hopefully her QT will remain stable.  Carleene Overlie Moana [place],MD

## 2022-07-15 NOTE — Progress Notes (Signed)
Morning EKG reviewed     Shows pt remains in afib with borderline QTc at ~470-480 ms, corrects to better by rate.   Continue  Tikosyn 250 mcg BID.   Potassium4.5 (03/13 0207) Magnesium  2.4 (03/13 0207) Creatinine, ser  0.97 (03/13 0207)  Pt will be NPO after midnight for DCCV if remains in [place], PA-C  07/15/2022 1:24 PM

## 2022-07-16 ENCOUNTER — Inpatient Hospital Stay (HOSPITAL_COMMUNITY): Payer: Medicaid Other | Admitting: Anesthesiology

## 2022-07-16 ENCOUNTER — Other Ambulatory Visit: Payer: Self-pay

## 2022-07-16 ENCOUNTER — Encounter (HOSPITAL_COMMUNITY): Payer: Self-pay | Admitting: Cardiology

## 2022-07-16 ENCOUNTER — Encounter (HOSPITAL_COMMUNITY)
Admission: RE | Disposition: A | Discharge status: Home / Self Care | Payer: Self-pay | Source: Ambulatory Visit | Attending: Cardiovascular Disease

## 2022-07-16 DIAGNOSIS — I4891 Unspecified atrial fibrillation: Secondary | ICD-10-CM | POA: Diagnosis not present

## 2022-07-16 DIAGNOSIS — F418 Other specified anxiety disorders: Secondary | ICD-10-CM | POA: Diagnosis not present

## 2022-07-16 DIAGNOSIS — I4819 Other persistent atrial fibrillation: Secondary | ICD-10-CM | POA: Diagnosis not present

## 2022-07-16 DIAGNOSIS — D649 Anemia, unspecified: Secondary | ICD-10-CM

## 2022-07-16 DIAGNOSIS — J449 Chronic obstructive pulmonary disease, unspecified: Secondary | ICD-10-CM | POA: Diagnosis not present

## 2022-07-16 HISTORY — PX: CARDIOVERSION: SHX1299

## 2022-07-16 LAB — BASIC METABOLIC PANEL
Anion gap: 8 (ref 5–15)
BUN: 20 mg/dL (ref 6–20)
CO2: 30 mmol/L (ref 22–32)
Calcium: 9 mg/dL (ref 8.9–10.3)
Chloride: 101 mmol/L (ref 98–111)
Creatinine, Ser: 0.98 mg/dL (ref 0.44–1.00)
GFR, Estimated: >60 mL/min (ref >60)
Glucose, Bld: 98 mg/dL (ref 70–99)
Potassium: 4.2 mmol/L (ref 3.5–5.1)
Sodium: 139 mmol/L (ref 135–145)

## 2022-07-16 LAB — PROTIME-INR
INR: 1.8 — ABNORMAL HIGH (ref 0.8–1.2)
Prothrombin Time: 20.3 seconds — ABNORMAL HIGH (ref 11.4–15.2)

## 2022-07-16 LAB — CBC
HCT: 32 % — ABNORMAL LOW (ref 36.0–46.0)
Hemoglobin: 9.9 g/dL — ABNORMAL LOW (ref 12.0–15.0)
MCH: 26.1 pg (ref 26.0–34.0)
MCHC: 30.9 g/dL (ref 30.0–36.0)
MCV: 84.2 fL (ref 80.0–100.0)
Platelets: 376 10*3/uL (ref 150–400)
RBC: 3.8 MIL/uL — ABNORMAL LOW (ref 3.87–5.11)
RDW: 15 % (ref 11.5–15.5)
WBC: 15.8 10*3/uL — ABNORMAL HIGH (ref 4.0–10.5)
nRBC: 0 % (ref 0.0–0.2)

## 2022-07-16 LAB — HEPARIN LEVEL (UNFRACTIONATED)
Heparin Unfractionated: 0.27 IU/mL — ABNORMAL LOW (ref 0.30–0.70)
Heparin Unfractionated: 0.36 IU/mL (ref 0.30–0.70)

## 2022-07-16 LAB — MAGNESIUM: Magnesium: 2.6 mg/dL — ABNORMAL HIGH (ref 1.7–2.4)

## 2022-07-16 SURGERY — CARDIOVERSION
Anesthesia: General

## 2022-07-16 MED ORDER — SODIUM CHLORIDE 0.9 % IV SOLN: INTRAVENOUS | Status: DC

## 2022-07-16 MED ORDER — PROPOFOL 10 MG/ML IV BOLUS
INTRAVENOUS | Status: DC | PRN
  Administered 2022-07-16: 60 mg via INTRAVENOUS

## 2022-07-16 MED ORDER — WARFARIN SODIUM 5 MG PO TABS
5.0000 mg | ORAL_TABLET | Freq: Once | ORAL | Status: AC
  Administered 2022-07-16: 5 mg via ORAL
  Filled 2022-07-16: qty 1

## 2022-07-16 MED ORDER — LIDOCAINE 2% (20 MG/ML) 5 ML SYRINGE
INTRAMUSCULAR | Status: DC | PRN
  Administered 2022-07-16: 40 mg via INTRAVENOUS

## 2022-07-16 NOTE — Anesthesia Postprocedure Evaluation (Signed)
Anesthesia Post Note  Patient: Mallory Sutton  Procedure(s) Performed: CARDIOVERSION     Patient location during evaluation: PACU Anesthesia Type: General Level of consciousness: awake and alert Pain management: pain level controlled Vital Signs Assessment: post-procedure vital signs reviewed and stable Respiratory status: spontaneous breathing, nonlabored ventilation, respiratory function stable and patient connected to nasal cannula oxygen Cardiovascular status: blood pressure returned to baseline and stable Postop Assessment: no apparent nausea or vomiting Anesthetic complications: no   No notable events documented.  Last Vitals:  Vitals:   07/16/22 1345 07/16/22 1447  BP: 120/62 130/86  Pulse: 79   Resp: 19   Temp:    SpO2: 100%     Last Pain:  Vitals:   07/16/22 1345  TempSrc:   PainSc: [place]

## 2022-07-16 NOTE — Progress Notes (Addendum)
Electrophysiology Rounding Note  Patient Name: Mallory Sutton Date of Encounter: 07/16/2022  Primary Cardiologist: Mallory Rouge, MD  Electrophysiologist: None    Subjective   Pt remains in afib on Tikosyn 250 mcg BID   QTc from EKG last pm shows  borderline  at 460-480 when corrected for rapid rates  The patient is doing well today.  At this time, the patient denies chest pain, shortness of breath, or any new concerns.  Inpatient Medications    Scheduled Meds:  ALPRAZolam  0.5 mg Oral TID   aspirin EC  81 mg Oral Q breakfast   calcium-vitamin D  1 tablet Oral Daily   dofetilide  250 mcg Oral BID   feeding supplement  237 mL Oral BID BM   furosemide  80 mg Oral Daily   gabapentin  300 mg Oral BID   gabapentin  600 mg Oral QHS   magnesium oxide  400 mg Oral Daily   metoprolol succinate  50 mg Oral Daily   mometasone-formoterol  2 puff Inhalation BID   And   umeclidinium bromide  1 puff Inhalation Daily   multivitamin with minerals  1 tablet Oral Daily   pantoprazole  40 mg Oral Daily   potassium chloride SA  20 mEq Oral TID   predniSONE  20 mg Oral Q breakfast   sertraline  25 mg Oral Daily   sodium chloride flush  3 mL Intravenous Q12H   Warfarin - Pharmacist Dosing Inpatient   Does not apply q1600   Continuous Infusions:  sodium chloride     sodium chloride     heparin 1,000 Units/hr (07/16/22 0045)   PRN Meds: sodium chloride, acetaminophen, albuterol, HYDROcodone-acetaminophen, ipratropium-albuterol, sodium chloride flush, zolpidem   Vital Signs    Vitals:   07/15/22 2050 07/15/22 2056 07/15/22 2331 07/16/22 0526  BP:  124/75 (!) 121/92 118/68  Pulse:  (!) 164 (!) 147 (!) 143  Resp:  '20 18 18  '$ Temp:  98 F (36.7 C) 97.7 F (36.5 C) 97.6 F (36.4 C)  TempSrc:  Oral Oral Oral  SpO2: 100% 100% 100% 100%    Intake/Output Summary (Last 24 hours) at 07/16/2022 0706 Last data filed at 07/16/2022 0500 Gross per 24 hour  Intake 157.08 ml  Output --  Net  157.08 ml   There were no vitals filed for this visit.  Physical Exam    GEN- NAD, A&O x 3. Normal affect.  Lungs- CTAB, Normal effort.  Heart- Irregularly irregular rate and rhythm. No M/G/R GI- Soft, NT, ND Extremities- No clubbing, cyanosis, or edema Skin- no rash or lesion  Labs    CBC No results for input(s): "WBC", "NEUTROABS", "HGB", "HCT", "MCV", "PLT" in the last 72 hours. Basic Metabolic Panel Recent Labs    07/14/22 1010 07/14/22 1911 07/15/22 0207  NA 135 135 137  K 3.7 3.9 4.5  CL 94* 92* 99  CO2 27 33* 31  GLUCOSE 141* 150* 117*  BUN '15 13 17  '$ CREATININE 1.04* 1.05* 0.97  CALCIUM 9.0 9.2 8.7*  MG 2.4  --  2.4    Telemetry    AF RVR 110-140s (personally reviewed)  Patient Profile     Mallory Sutton is a 63 y.o. female with a past medical history significant for persistent atrial fibrillation.  They were admitted for tikosyn load.   Assessment & Plan    Persistent atrial fibrillation Pt remains in afib on Tikosyn 250 mcg BID  Continue Coumadin Creatinine, ser  0.97 (03/13 0207) Magnesium  2.4 (03/13 0207) Potassium4.5 (03/13 0207) No electrolyte supplementation needed  If pt does not convert chemically, plan on DCCV this afternoon.   2. Sub-therapeutic INR INR 1.7 this am.  Was 2.0 on admission -> dropped to 1.8 and started on heparin.  She had subtherapeutic levels several weeks ago but was covered on lovenox around the time of her prior cardioversion.  Reviewed with both Dr. Lovena Sutton and Dr Mallory Sutton who agree OK to proceed with Mallory Sutton without TEE given continuous coverage.    For questions or updates, please contact Mallory Sutton Please consult [email or web address] for contact info under Cardiology/STEMI.  Signed, Mallory Friar, PA-C  07/16/2022, 7:06 AM   EP Attending  Patient seen and examined. Agree with above. She remains in atrial fib with a RVR, and her QT is borderline. She will undergo DCCV later today unless she converts with her  next dose of dofetilide. No change in plan at this time. It will be easier to assess her QTC once she is back in NSR.  Mallory Overlie Mallory Schlauch,MD

## 2022-07-16 NOTE — Progress Notes (Signed)
Morning EKG reviewed     Shows pt remains in afib with RVR with borderline QTc at ~480 ms, better when corrected for rate.  Continue  Tikosyn 250 mcg BID.   Potassium4.2 (03/14 [id number]) Magnesium  2.6* (03/14 [id number]) Creatinine, ser  0.98 (03/14 [id number])  Pending DCC This afternoon.   Will need to follow closely for decompensation given how long she has been in AF RVR.    Shirley Friar, PA-C  07/16/2022 11:06 AM

## 2022-07-16 NOTE — H&P (View-Only) (Signed)
Morning EKG reviewed     Shows pt remains in afib with RVR with borderline QTc at ~480 ms, better when corrected for rate.  Continue  Tikosyn 250 mcg BID.   Potassium4.2 (03/14 [id number]) Magnesium  2.6* (03/14 [id number]) Creatinine, ser  0.98 (03/14 [id number])  Pending DCC This afternoon.   Will need to follow closely for decompensation given how long she has been in AF RVR.    Shirley Friar, PA-C  07/16/2022 11:06 AM

## 2022-07-16 NOTE — Progress Notes (Signed)
ANTICOAGULATION CONSULT NOTE Pharmacy Consult:  Heparin Indication: Afib and mechanical AVR Brief A/P: Heparin level subtherapeutic Increase Heparin rate   Allergies  Allergen Reactions   Amiodarone Nausea Only   Penicillins Hives    Has patient had a PCN reaction causing immediate rash, facial/tongue/throat swelling, SOB or lightheadedness with hypotension: YES Has patient had a PCN reaction causing severe rash involving mucus membranes or skin necrosis: NO Has patient had a PCN reaction that required hospitalization NO Has patient had a PCN reaction occurring within the last 10 years: NO If all of the above answers are "NO", then may proceed with Cephalosporin use.     Patient Measurements:   Heparin Dosing Weight: 50 kg  Vital Signs: Temp: 97.7 F (36.5 C) (03/13 2331) Temp Source: Oral (03/13 2331) BP: 121/92 (03/13 2331) Pulse Rate: 147 (03/13 2331)  Labs: Recent Labs    07/14/22 0847 07/14/22 1010 07/14/22 1911 07/15/22 0207 07/15/22 1406 07/15/22 2341  LABPROT  --   --  20.5*  --  19.7*  --   INR 2.0  --  1.8*  --  1.7*  --   HEPARINUNFRC  --   --   --   --  0.11* 0.27*  CREATININE  --  1.04* 1.05* 0.97  --   --      Estimated Creatinine Clearance: 48.9 mL/min (by C-G formula based on SCr of 0.97 mg/dL).  Assessment: 62 yo female with history of Afib and a mechanical AVR, INR subtherapeutic, for heparin  Goal of Therapy:  Heparin level 0.3-0.7 units/ml Monitor platelets by anticoagulation protocol: Yes   Plan:  Increase Heparin 1000 units/hr Follow-up am labs.  Phillis Knack, PharmD, BCPS  07/16/2022, 12:26 AM

## 2022-07-16 NOTE — Progress Notes (Signed)
[place] for warfarin, heparin  Indication:  afib and mechanical AVR  Allergies  Allergen Reactions   Amiodarone Nausea Only   Penicillins Hives    Has patient had a PCN reaction causing immediate rash, facial/tongue/throat swelling, SOB or lightheadedness with hypotension: YES Has patient had a PCN reaction causing severe rash involving mucus membranes or skin necrosis: NO Has patient had a PCN reaction that required hospitalization NO Has patient had a PCN reaction occurring within the last 10 years: NO If all of the above answers are "NO", then may proceed with Cephalosporin use.     Patient Measurements:    Vital Signs: Temp: 98.2 F (36.8 C) (03/14 0848) Temp Source: Oral (03/14 0526) BP: 141/93 (03/14 0848) Pulse Rate: 153 (03/14 0848)  Labs: Recent Labs    07/14/22 1911 07/15/22 0207 07/15/22 1406 07/15/22 2341 07/16/22 0833  HGB  --   --   --   --  9.9*  HCT  --   --   --   --  32.0*  PLT  --   --   --   --  376  LABPROT 20.5*  --  19.7*  --  20.3*  INR 1.8*  --  1.7*  --  1.8*  HEPARINUNFRC  --   --  0.11* 0.27* 0.36  CREATININE 1.05* 0.97  --   --  0.98     Estimated Creatinine Clearance: 48.4 mL/min (by C-G formula based on SCr of 0.98 mg/dL).   Medical History: Past Medical History:  Diagnosis Date   Aortic stenosis    ATRIAL ARRHYTHMIAS    Bicuspid aortic valve    CHF (congestive heart failure) (HCC)    COARCTATION OF AORTA    ENDOMETRIOSIS    Heart murmur    Paroxysmal atrial fibrillation (HCC)    Shortness of breath    THORACIC AORTIC ANEURYSM    Vasculitis (HCC)     Medications:  Medications Prior to Admission  Medication Sig Dispense Refill Last Dose   acetaminophen (TYLENOL) 325 MG tablet Take 2 tablets (650 mg total) by mouth every 6 (six) hours as needed for mild pain (or Fever >/= 101). 100 tablet 1 Past Month   albuterol (VENTOLIN HFA) 108 (90 Base) MCG/ACT inhaler Inhale 2 puffs into the  lungs every 4 (four) hours as needed for wheezing or shortness of breath. 54 g 6 07/12/2022   ALPRAZolam (XANAX) 0.5 MG tablet Take 0.5 mg by mouth 3 (three) times daily.   07/14/2022   aspirin EC 81 MG tablet Take 1 tablet (81 mg total) by mouth daily with breakfast. 30 tablet 11 07/14/2022   Budeson-Glycopyrrol-Formoterol (BREZTRI AEROSPHERE) 160-9-4.8 MCG/ACT AERO Inhale 2 puffs into the lungs 2 (two) times daily. 10.7 g 1 07/14/2022   calcium-vitamin D (OSCAL WITH D) 500-5 MG-MCG tablet Take 1 tablet by mouth daily. 30 tablet 2 07/14/2022   feeding supplement (ENSURE ENLIVE / ENSURE PLUS) LIQD Take 237 mLs by mouth 2 (two) times daily between meals.   07/13/2022   furosemide (LASIX) 40 MG tablet Take 2 tablets (80 mg total) by mouth daily. 30 tablet 3 07/13/2022   gabapentin (NEURONTIN) 300 MG capsule Take 300-600 mg by mouth See admin instructions. Take 300 mg tablet by mouth in the morning, 300 mg tablet by mouth in the afternoon, and then take two of the 300 mg tablets by mouth for total of 600 mg in the evening per patient   07/14/2022   HYDROcodone-acetaminophen (  NORCO/VICODIN) 5-325 MG tablet Take 2 tablets by mouth 2 (two) times daily.   07/14/2022   ipratropium-albuterol (DUONEB) 0.5-2.5 (3) MG/3ML SOLN Inhale 3 mLs into the lungs 2 (two) times daily as needed (shortness of breath). 360 mL 1 07/14/2022   magnesium oxide (MAGOX 400) 400 (240 Mg) MG tablet Take 1 tablet (400 mg total) by mouth daily. 30 tablet 2 07/14/2022   metoprolol succinate (TOPROL-XL) 50 MG 24 hr tablet Take 1 tablet (50 mg total) by mouth daily. Take with or immediately following a meal. 30 tablet 11 07/14/2022 at 1000   Multiple Vitamin (MULTI-VITAMINS) TABS Take 1 tablet by mouth daily.    07/14/2022   pantoprazole (PROTONIX) 40 MG tablet Take 1 tablet (40 mg total) by mouth daily. 30 tablet 2 07/14/2022   potassium chloride SA (KLOR-CON M) 20 MEQ tablet Take 20 mEq by mouth 3 (three) times daily.   07/14/2022   predniSONE  (DELTASONE) 20 MG tablet Take 1 tablet (20 mg total) by mouth daily with breakfast. 90 tablet 3 07/14/2022   sertraline (ZOLOFT) 25 MG tablet Take 1 tablet (25 mg total) by mouth daily.   07/14/2022   warfarin (COUMADIN) 1 MG tablet Take 3 tablets daily except 4 tablets on Fridays or as directed by coumadin clinic (Patient taking differently: Take 1-4 mg by mouth See admin instructions. Take 3 tablets every day except on Fridays take four of the 1 mg tablets to make full dose of 4 mg or as directed by coumadin clinic) 100 tablet 5 07/13/2022 at 2100   zolpidem (AMBIEN) 10 MG tablet Take 1 tablet (10 mg total) by mouth at bedtime as needed for sleep. (Patient taking differently: Take 5 mg by mouth at bedtime as needed for sleep.) 10 tablet 0 07/13/2022    Assessment: 62 y.o. F presents for dofetilide initiation. Pt on warfarin PTA for afib and mechancial AVR. Admission INR < 2  With plans for DCCV and INR < 2  added heparin drip until INR at goal  Heparin drip 1000 uts/hr with heparin level 0.32 at goal  INR increase 1.7>1.8 with warfarin boost last pm will reboost today   Home warfarin dose: '3mg'$  daily except for '4mg'$  on Friday  Goal of Therapy:  INR goal 2.5-3.5 Heparin level 0.3-0.7 Monitor platelets by anticoagulation protocol: Yes   Plan:  Warfarin '5mg'$  tonight -repeat tonight  Daily INR Continue heparin at 1000 units/hr Heparin level and daily wth CBC daily and INR daily  Monitor s/s bleeding    Bonnita Nasuti Pharm.D. CPP, BCPS Clinical Pharmacist [phone] 07/16/2022 12:49 PM   **Pharmacist phone directory can now be found on [email or web address] ([email or web address]).  Listed under [place].

## 2022-07-16 NOTE — Interval H&P Note (Signed)
History and Physical Interval Note:  07/16/2022 1:08 PM  Mallory Sutton  has presented today for surgery, with the diagnosis of afib.  The various methods of treatment have been discussed with the patient and family. After consideration of risks, benefits and other options for treatment, the patient has consented to  Procedure(s): CARDIOVERSION (N/A) as a surgical intervention.  The patient's history has been reviewed, patient examined, no change in status, stable for surgery.  I have reviewed the patient's chart and labs.  Questions were answered to the patient's satisfaction.     NPO for DCCV. INR 1.8. Heparin level therapeutic.   [name], MD, [place]  [address], [place], [place] [address] [phone]  1:08 PM

## 2022-07-16 NOTE — Anesthesia Preprocedure Evaluation (Addendum)
Anesthesia Evaluation  Patient identified by MRN, date of birth, ID band Patient awake    Reviewed: Allergy & Precautions, NPO status , Patient's Chart, lab work & pertinent test results  History of Anesthesia Complications Negative for: history of anesthetic complications  Airway Mallampati: III  TM Distance: >3 FB Neck ROM: Full    Dental  (+) Dental Advisory Given, Chipped   Pulmonary COPD,  COPD inhaler and oxygen dependent    + decreased breath sounds+ wheezing      Cardiovascular + dysrhythmias Atrial Fibrillation + Valvular Problems/Murmurs (bicuspid AV) AI  Rhythm:Irregular Rate:Tachycardia   '24 TTE - EF 60 to 65%. There is moderate left ventricular hypertrophy. Agitated saline contrast bubble study was negative, with no evidence of any interatrial shunt. Limited echo with bubble study to evaluate for intracardiac shunting. (Echo in 2023 showed mild-mod AI, no AS)     Neuro/Psych  PSYCHIATRIC DISORDERS Anxiety Depression    negative neurological ROS     GI/Hepatic Neg liver ROS,GERD  Medicated and Controlled,,  Endo/Other  negative endocrine ROS    Renal/GU negative Renal ROS     Musculoskeletal negative musculoskeletal ROS (+)    Abdominal   Peds  Hematology  (+) Blood dyscrasia, anemia  On coumadin    Anesthesia Other Findings   Reproductive/Obstetrics                             Anesthesia Physical Anesthesia Plan  ASA: 3  Anesthesia Plan: General   Post-op Pain Management: Minimal or no pain anticipated   Induction: Intravenous  PONV Risk Score and Plan: 3 and Treatment may vary due to age or medical condition and Propofol infusion  Airway Management Planned: Natural Airway and Mask  Additional Equipment: None  Intra-op Plan:   Post-operative Plan:   Informed Consent: I have reviewed the patients History and Physical, chart, labs and discussed the procedure  including the risks, benefits and alternatives for the proposed anesthesia with the patient or authorized representative who has indicated his/her understanding and acceptance.       Plan Discussed with: CRNA and Anesthesiologist  Anesthesia Plan Comments:        Anesthesia Quick Evaluation

## 2022-07-16 NOTE — CV Procedure (Signed)
   DIRECT CURRENT CARDIOVERSION  NAME:  Mallory Sutton    MRN: [id number] DOB:  06-06-1960    ADMIT DATE: 07/14/2022  Indication:  Symptomatic atrial fibrillation   Procedure Note:  The patient signed informed consent.  They have had had therapeutic anticoagulation with warfarin/heparin greater than 3 weeks.  Anesthesia was administered by Dr. Fransisco Beau.  Adequate airway was maintained throughout and vital followed per protocol.  They were cardioverted x 1 with 120J of biphasic synchronized energy.  They converted to NSR.  There were no apparent complications.  The patient had normal neuro status and respiratory status post procedure with vitals stable as recorded elsewhere.    Follow up: They will continue on current medical therapy and follow up with cardiology as scheduled.  [name], MD, [facility]  [address] [place], [place] [address] [phone]  1:29 PM

## 2022-07-16 NOTE — Care Management (Signed)
1510 07-16-22 Case Manager spoke with the patient regarding co pay cost. Patient is agreeable to cost and would like to have the initial Rx filled via [facility] and the Rx refills escribed to [facility]. No further needs identified at this time.

## 2022-07-16 NOTE — Transfer of Care (Signed)
Immediate Anesthesia Transfer of Care Note  Patient: Mallory Sutton  Procedure(s) Performed: CARDIOVERSION  Patient Location: Endoscopy Unit  Anesthesia Type:MAC  Level of Consciousness: awake  Airway & Oxygen Therapy: Patient Spontanous Breathing and Patient connected to nasal cannula oxygen  Post-op Assessment: Report given to RN and Post -op Vital signs reviewed and stable  Post vital signs: Reviewed and stable  Last Vitals:  Vitals Value Taken Time  BP    Temp    Pulse    Resp    SpO2      Last Pain:  Vitals:   07/16/22 1248  TempSrc: Temporal  PainSc: 7       Patients Stated Pain Goal: 4 ([id number])  Complications: No notable events documented.

## 2022-07-17 ENCOUNTER — Other Ambulatory Visit: Payer: Self-pay

## 2022-07-17 ENCOUNTER — Other Ambulatory Visit (HOSPITAL_COMMUNITY): Payer: Self-pay

## 2022-07-17 ENCOUNTER — Telehealth (HOSPITAL_COMMUNITY): Payer: Self-pay

## 2022-07-17 DIAGNOSIS — I4819 Other persistent atrial fibrillation: Secondary | ICD-10-CM | POA: Diagnosis not present

## 2022-07-17 LAB — BASIC METABOLIC PANEL
Anion gap: 7 (ref 5–15)
BUN: 22 mg/dL — ABNORMAL HIGH (ref 6–20)
CO2: 29 mmol/L (ref 22–32)
Calcium: 8.4 mg/dL — ABNORMAL LOW (ref 8.9–10.3)
Chloride: 100 mmol/L (ref 98–111)
Creatinine, Ser: 1.03 mg/dL — ABNORMAL HIGH (ref 0.44–1.00)
GFR, Estimated: >60 mL/min (ref >60)
Glucose, Bld: 93 mg/dL (ref 70–99)
Potassium: 3.6 mmol/L (ref 3.5–5.1)
Sodium: 136 mmol/L (ref 135–145)

## 2022-07-17 LAB — MAGNESIUM: Magnesium: 2.5 mg/dL — ABNORMAL HIGH (ref 1.7–2.4)

## 2022-07-17 LAB — HEPARIN LEVEL (UNFRACTIONATED): Heparin Unfractionated: 0.29 IU/mL — ABNORMAL LOW (ref 0.30–0.70)

## 2022-07-17 LAB — PROTIME-INR
INR: 2.1 — ABNORMAL HIGH (ref 0.8–1.2)
Prothrombin Time: 23.6 seconds — ABNORMAL HIGH (ref 11.4–15.2)

## 2022-07-17 LAB — CBC
HCT: 25 % — ABNORMAL LOW (ref 36.0–46.0)
Hemoglobin: 7.9 g/dL — ABNORMAL LOW (ref 12.0–15.0)
MCH: 26.1 pg (ref 26.0–34.0)
MCHC: 31.6 g/dL (ref 30.0–36.0)
MCV: 82.5 fL (ref 80.0–100.0)
Platelets: 319 10*3/uL (ref 150–400)
RBC: 3.03 MIL/uL — ABNORMAL LOW (ref 3.87–5.11)
RDW: 15 % (ref 11.5–15.5)
WBC: 13.4 10*3/uL — ABNORMAL HIGH (ref 4.0–10.5)
nRBC: 0 % (ref 0.0–0.2)

## 2022-07-17 MED ORDER — ENOXAPARIN SODIUM 40 MG/0.4ML IJ SOSY
40.0000 mg | PREFILLED_SYRINGE | Freq: Two times a day (BID) | INTRAMUSCULAR | Status: DC
  Administered 2022-07-17: 40 mg via SUBCUTANEOUS
  Filled 2022-07-17: qty 0.4

## 2022-07-17 MED ORDER — WARFARIN SODIUM 5 MG PO TABS: 5.0000 mg | ORAL_TABLET | Freq: Once | ORAL | Status: DC

## 2022-07-17 MED ORDER — POTASSIUM CHLORIDE CRYS ER 20 MEQ PO TBCR
60.0000 meq | EXTENDED_RELEASE_TABLET | Freq: Once | ORAL | Status: AC
  Administered 2022-07-17: 60 meq via ORAL
  Filled 2022-07-17: qty 3

## 2022-07-17 MED ORDER — DOFETILIDE 250 MCG PO CAPS
250.0000 ug | ORAL_CAPSULE | Freq: Two times a day (BID) | ORAL | 0 refills | Status: DC
  Filled 2022-07-17: qty 60, 30d supply, fill #0

## 2022-07-17 NOTE — Telephone Encounter (Signed)
Pharmacy Patient Advocate Encounter  Insurance verification completed.    The patient is insured through [facility]   The patient is currently admitted and ran test claims for the following: Enoxaparin.  Copays and coinsurance results were relayed to Inpatient clinical team.

## 2022-07-17 NOTE — Plan of Care (Signed)
  Problem: Skin Integrity: Goal: Risk for impaired skin integrity will decrease Outcome: Adequate for Discharge   Problem: Safety: Goal: Ability to remain free from injury will improve Outcome: Adequate for Discharge   Problem: Pain Managment: Goal: General experience of comfort will improve Outcome: Adequate for Discharge   Problem: Elimination: Goal: Will not experience complications related to bowel motility Outcome: Adequate for Discharge Goal: Will not experience complications related to urinary retention Outcome: Adequate for Discharge   Problem: Coping: Goal: Level of anxiety will decrease Outcome: Adequate for Discharge   Problem: Nutrition: Goal: Adequate nutrition will be maintained Outcome: Adequate for Discharge   Problem: Activity: Goal: Risk for activity intolerance will decrease Outcome: Adequate for Discharge   Problem: Clinical Measurements: Goal: Ability to maintain clinical measurements within normal limits will improve Outcome: Adequate for Discharge Goal: Will remain free from infection Outcome: Adequate for Discharge Goal: Diagnostic test results will improve Outcome: Adequate for Discharge Goal: Respiratory complications will improve Outcome: Adequate for Discharge Goal: Cardiovascular complication will be avoided Outcome: Adequate for Discharge   Problem: Health Behavior/Discharge Planning: Goal: Ability to manage health-related needs will improve Outcome: Adequate for Discharge   Problem: Education: Goal: Knowledge of General Education information will improve Description: Including pain rating scale, medication(s)/side effects and non-pharmacologic comfort measures Outcome: Adequate for Discharge   

## 2022-07-17 NOTE — Progress Notes (Addendum)
[place] for warfarin, heparin  Indication:  afib and mechanical AVR  Allergies  Allergen Reactions   Amiodarone Nausea Only   Penicillins Hives    Has patient had a PCN reaction causing immediate rash, facial/tongue/throat swelling, SOB or lightheadedness with hypotension: YES Has patient had a PCN reaction causing severe rash involving mucus membranes or skin necrosis: NO Has patient had a PCN reaction that required hospitalization NO Has patient had a PCN reaction occurring within the last 10 years: NO If all of the above answers are "NO", then may proceed with Cephalosporin use.     Patient Measurements: Height: 5\' 4"  (162.6 cm) Weight: 50.2 kg (110 lb 10.7 oz) IBW/kg (Calculated) : 54.7  Vital Signs: Temp: 97.8 F (36.6 C) (03/15 0437) Temp Source: Oral (03/15 0437) BP: 113/72 (03/15 0437) Pulse Rate: 77 (03/15 0437)  Labs: Recent Labs    07/14/22 1911 07/15/22 0207 07/15/22 1406 07/15/22 2341 07/16/22 0833 07/17/22 0224  HGB  --   --   --   --  9.9* 7.9*  HCT  --   --   --   --  32.0* 25.0*  PLT  --   --   --   --  376 319  LABPROT  --   --  19.7*  --  20.3* 23.6*  INR  --   --  1.7*  --  1.8* 2.1*  HEPARINUNFRC   < >  --  0.11* 0.27* 0.36 0.29*  CREATININE  --  0.97  --   --  0.98 1.03*   < > = values in this interval not displayed.     Estimated Creatinine Clearance: 46 mL/min (A) (by C-G formula based on SCr of 1.03 mg/dL (H)).   Medical History: Past Medical History:  Diagnosis Date   Aortic stenosis    ATRIAL ARRHYTHMIAS    Bicuspid aortic valve    CHF (congestive heart failure) (HCC)    COARCTATION OF AORTA    ENDOMETRIOSIS    Heart murmur    Paroxysmal atrial fibrillation (HCC)    Shortness of breath    THORACIC AORTIC ANEURYSM    Vasculitis (HCC)     Medications:  Medications Prior to Admission  Medication Sig Dispense Refill Last Dose   acetaminophen (TYLENOL) 325 MG tablet Take 2 tablets (650  mg total) by mouth every 6 (six) hours as needed for mild pain (or Fever >/= 101). 100 tablet 1 Past Month   albuterol (VENTOLIN HFA) 108 (90 Base) MCG/ACT inhaler Inhale 2 puffs into the lungs every 4 (four) hours as needed for wheezing or shortness of breath. 54 g 6 07/12/2022   ALPRAZolam (XANAX) 0.5 MG tablet Take 0.5 mg by mouth 3 (three) times daily.   07/14/2022   aspirin EC 81 MG tablet Take 1 tablet (81 mg total) by mouth daily with breakfast. 30 tablet 11 07/14/2022   Budeson-Glycopyrrol-Formoterol (BREZTRI AEROSPHERE) 160-9-4.8 MCG/ACT AERO Inhale 2 puffs into the lungs 2 (two) times daily. 10.7 g 1 07/14/2022   calcium-vitamin D (OSCAL WITH D) 500-5 MG-MCG tablet Take 1 tablet by mouth daily. 30 tablet 2 07/14/2022   feeding supplement (ENSURE ENLIVE / ENSURE PLUS) LIQD Take 237 mLs by mouth 2 (two) times daily between meals.   07/13/2022   furosemide (LASIX) 40 MG tablet Take 2 tablets (80 mg total) by mouth daily. 30 tablet 3 07/13/2022   gabapentin (NEURONTIN) 300 MG capsule Take 300-600 mg by mouth See admin instructions. Take  300 mg tablet by mouth in the morning, 300 mg tablet by mouth in the afternoon, and then take two of the 300 mg tablets by mouth for total of 600 mg in the evening per patient   07/14/2022   HYDROcodone-acetaminophen (NORCO/VICODIN) 5-325 MG tablet Take 2 tablets by mouth 2 (two) times daily.   07/14/2022   ipratropium-albuterol (DUONEB) 0.5-2.5 (3) MG/3ML SOLN Inhale 3 mLs into the lungs 2 (two) times daily as needed (shortness of breath). 360 mL 1 07/14/2022   magnesium oxide (MAGOX 400) 400 (240 Mg) MG tablet Take 1 tablet (400 mg total) by mouth daily. 30 tablet 2 07/14/2022   metoprolol succinate (TOPROL-XL) 50 MG 24 hr tablet Take 1 tablet (50 mg total) by mouth daily. Take with or immediately following a meal. 30 tablet 11 07/14/2022 at 1000   Multiple Vitamin (MULTI-VITAMINS) TABS Take 1 tablet by mouth daily.    07/14/2022   pantoprazole (PROTONIX) 40 MG tablet Take 1  tablet (40 mg total) by mouth daily. 30 tablet 2 07/14/2022   potassium chloride SA (KLOR-CON M) 20 MEQ tablet Take 20 mEq by mouth 3 (three) times daily.   07/14/2022   predniSONE (DELTASONE) 20 MG tablet Take 1 tablet (20 mg total) by mouth daily with breakfast. 90 tablet 3 07/14/2022   sertraline (ZOLOFT) 25 MG tablet Take 1 tablet (25 mg total) by mouth daily.   07/14/2022   warfarin (COUMADIN) 1 MG tablet Take 3 tablets daily except 4 tablets on Fridays or as directed by coumadin clinic (Patient taking differently: Take 1-4 mg by mouth See admin instructions. Take 3 tablets every day except on Fridays take four of the 1 mg tablets to make full dose of 4 mg or as directed by coumadin clinic) 100 tablet 5 07/13/2022 at 2100   zolpidem (AMBIEN) 10 MG tablet Take 1 tablet (10 mg total) by mouth at bedtime as needed for sleep. (Patient taking differently: Take 5 mg by mouth at bedtime as needed for sleep.) 10 tablet 0 07/13/2022    Assessment: 62 y.o. F presents for dofetilide initiation. Pt on warfarin PTA for afib and mechancial AVR. Admission INR < 2 -heparin level= 0.29, INR= 2.1 -s/p DCCV 3/15  Goal INR clarified as 2-3 with Dr. [name]. She has an On-X mechanical AVR ([facility] in 2013 at [facility])  Home warfarin dose: 3mg  daily except for 4mg  on Friday  Goal of Therapy:  INR goal 2-3 Monitor platelets by anticoagulation protocol: Yes   Plan:  Continue warfarin at her home dose (plans are for discharge today Daily INR Goal INR= 2-3 No need for lovenox at discharge  Hildred Laser, PharmD Clinical Pharmacist **Pharmacist phone directory can now be found on [email or web address] ([email or web address]).  Listed under [place].

## 2022-07-17 NOTE — Progress Notes (Signed)
EKG from yesterday evening 07/16/2022 reviewed     Shows is in NSR s/p [place] with stable QTc at ~470-480 ms.  Continue  Tikosyn 250 mcg BID.   Potassium3.6 (03/15 0224) Magnesium  2.5* (03/15 0224) Creatinine, ser  1.03* (03/15 0224)  Supp K  Plan for home this afternoon if QTc remains stable.   Shirley Friar, PA-C  07/17/2022 7:05 AM

## 2022-07-17 NOTE — Discharge Summary (Addendum)
ELECTROPHYSIOLOGY PROCEDURE DISCHARGE SUMMARY    Patient ID: Mallory Sutton,  MRN: [id number], DOB/AGE: 11/11/1960 62 y.o.  Admit date: 07/14/2022 Discharge date: 07/17/2022  Primary Care Physician: Mallory Nose, FNP  Primary Cardiologist: Mallory Rouge, MD  Electrophysiologist: Dr. Lovena Sutton   Primary Discharge Diagnosis:  1.  Persistent atrial fibrillation status post Tikosyn loading this admission  Secondary Discharge Diagnosis:  2. Subtherapeutic INR  Allergies  Allergen Reactions   Amiodarone Nausea Only   Penicillins Hives    Has patient had a PCN reaction causing immediate rash, facial/tongue/throat swelling, SOB or lightheadedness with hypotension: YES Has patient had a PCN reaction causing severe rash involving mucus membranes or skin necrosis: NO Has patient had a PCN reaction that required hospitalization NO Has patient had a PCN reaction occurring within the last 10 years: NO If all of the above answers are "NO", then may proceed with Cephalosporin use.      Procedures This Admission:  1.  Tikosyn loading  2.  Direct current cardioversion on Thursday July 16, 2022 by Dr Mallory Sutton which successfully restored SR.  There were no early apparent complications.   Brief HPI: Mallory Sutton is a 62 y.o. female with a past medical history as noted above.  They were referred to EP for treatment options of atrial fibrillation.  Risks, benefits, and alternatives to Tikosyn were reviewed with the patient who wished to proceed with admission for loading.  Hospital Course:  The patient was admitted and Tikosyn was initiated.  Renal function and electrolytes were followed during the hospitalization.  Their QTc remained stable. On 3/14 they underwent direct current cardioversion which restored sinus rhythm. The patients QTc remained stable. They were monitored on telemetry up to discharge. On the day of discharge, they were examined by Dr. Curt Sutton  who considered them stable for  discharge to home.  Follow-up has been arranged with the Atrial Fibrillation clinic in approximately 1 week.   Course complicated by sub-therapeutic INR, was covered by heparin and Mallory Sutton have close coumadin clinic follow up on discharge.   Physical Exam: Vitals:   07/16/22 2329 07/17/22 0437 07/17/22 0757 07/17/22 0852  BP: 107/62 113/72  (!) 139/49  Pulse: 73 77  84  Resp: 19 16  16   Temp: 98.4 F (36.9 C) 97.8 F (36.6 C)    TempSrc: Oral Oral    SpO2: 100% 99% 96%   Weight:      Height:        GEN- NAD, A&O x 3. Normal affect.  Lungs- CTAB, Normal effort.  Heart- Regular rate and rhythm. No M/G/R GI- Soft, NT, ND Extremities- No clubbing, cyanosis, or edema Skin- no rash or lesion  Labs:   Lab Results  Component Value Date   WBC 13.4 (H) 07/17/2022   HGB 7.9 (L) 07/17/2022   HCT 25.0 (L) 07/17/2022   MCV 82.5 07/17/2022   PLT 319 07/17/2022    Recent Labs  Lab 07/17/22 0224  NA 136  K 3.6  CL 100  CO2 29  BUN 22*  CREATININE 1.03*  CALCIUM 8.4*  GLUCOSE 93    Discharge Medications:  Allergies as of 07/17/2022       Reactions   Amiodarone Nausea Only   Penicillins Hives   Has patient had a PCN reaction causing immediate rash, facial/tongue/throat swelling, SOB or lightheadedness with hypotension: YES Has patient had a PCN reaction causing severe rash involving mucus membranes or skin necrosis: NO Has patient had a PCN  reaction that required hospitalization NO Has patient had a PCN reaction occurring within the last 10 years: NO If all of the above answers are "NO", then may proceed with Cephalosporin use.        Medication List     TAKE these medications    acetaminophen 325 MG tablet Commonly known as: TYLENOL Take 2 tablets (650 mg total) by mouth every 6 (six) hours as needed for mild pain (or Fever >/= 101).   ALPRAZolam 0.5 MG tablet Commonly known as: XANAX Take 0.5 mg by mouth 3 (three) times daily.   aspirin EC 81 MG tablet Take 1  tablet (81 mg total) by mouth daily with breakfast.   Breztri Aerosphere 160-9-4.8 MCG/ACT Aero Generic drug: Budeson-Glycopyrrol-Formoterol Inhale 2 puffs into the lungs 2 (two) times daily.   calcium-vitamin D 500-5 MG-MCG tablet Commonly known as: OSCAL WITH D Take 1 tablet by mouth daily.   dofetilide 250 MCG capsule Commonly known as: TIKOSYN Take 1 capsule (250 mcg total) by mouth 2 (two) times daily.   feeding supplement Liqd Take 237 mLs by mouth 2 (two) times daily between meals.   furosemide 40 MG tablet Commonly known as: LASIX Take 2 tablets (80 mg total) by mouth daily.   gabapentin 300 MG capsule Commonly known as: NEURONTIN Take 300-600 mg by mouth See admin instructions. Take 300 mg tablet by mouth in the morning, 300 mg tablet by mouth in the afternoon, and then take two of the 300 mg tablets by mouth for total of 600 mg in the evening per patient   HYDROcodone-acetaminophen 5-325 MG tablet Commonly known as: NORCO/VICODIN Take 2 tablets by mouth 2 (two) times daily.   ipratropium-albuterol 0.5-2.5 (3) MG/3ML Soln Commonly known as: DUONEB Inhale 3 mLs into the lungs 2 (two) times daily as needed (shortness of breath).   magnesium oxide 400 (240 Mg) MG tablet Commonly known as: MagOx 400 Take 1 tablet (400 mg total) by mouth daily.   metoprolol succinate 50 MG 24 hr tablet Commonly known as: TOPROL-XL Take 1 tablet (50 mg total) by mouth daily. Take with or immediately following a meal.   Multi-Vitamins Tabs Take 1 tablet by mouth daily.   pantoprazole 40 MG tablet Commonly known as: PROTONIX Take 1 tablet (40 mg total) by mouth daily.   potassium chloride SA 20 MEQ tablet Commonly known as: KLOR-CON M Take 20 mEq by mouth 3 (three) times daily.   predniSONE 20 MG tablet Commonly known as: DELTASONE Take 1 tablet (20 mg total) by mouth daily with breakfast.   sertraline 25 MG tablet Commonly known as: Zoloft Take 1 tablet (25 mg total) by  mouth daily.   Ventolin HFA 108 (90 Base) MCG/ACT inhaler Generic drug: albuterol Inhale 2 puffs into the lungs every 4 (four) hours as needed for wheezing or shortness of breath.   warfarin 1 MG tablet Commonly known as: COUMADIN Take as directed. If you are unsure how to take this medication, talk to your nurse or doctor. Original instructions: Take 3 tablets daily except 4 tablets on Fridays or as directed by coumadin clinic What changed:  how much to take how to take this when to take this additional instructions   zolpidem 10 MG tablet Commonly known as: AMBIEN Take 1 tablet (10 mg total) by mouth at bedtime as needed for sleep. What changed: how much to take        Disposition:    Follow-up Information     Barrington Ellison  Mitzi Hansen, PA-C Follow up.   Specialty: Cardiology Why: on Friday, 3/22 at 1220 for post tikosyn follow up Contact information: [address] [place] [address] [phone]                 Duration of Discharge Encounter: Greater than 30 minutes including physician time.  Signed, Shirley Friar, PA-C  07/17/2022 11:20 AM    I have seen and examined this patient with  Oda Kilts.  Agree with above, note added to reflect my findings.  Patient admitted for dofetilide load.  QTc is remained stable.  Did require cardioversion.  In sinus rhythm currently.  Keiron Iodice continue with current management.  Plan for discharge today with follow-up in clinic.  GEN: Well nourished, well developed, in no acute distress  HEENT: normal  Neck: no JVD, carotid bruits, or masses Cardiac: RRR; no murmurs, rubs, or gallops,no edema  Respiratory:  clear to auscultation bilaterally, normal work of breathing GI: soft, nontender, nondistended, + BS MS: no deformity or atrophy  Skin: warm and dry,  Neuro:  Strength and sensation are intact Psych: euthymic mood, full affect     Hiliana Eilts M. Cason Luffman MD 07/17/2022 12:03 PM

## 2022-07-17 NOTE — Progress Notes (Addendum)
Pharmacy: Dofetilide (Tikosyn) - Follow Up Assessment and Electrolyte Replacement  Pharmacy consulted to assist in monitoring and replacing electrolytes in this 62 y.o. female admitted on 07/14/2022 undergoing dofetilide initiation.   Labs:    Component Value Date/Time   K 3.6 07/17/2022 0224   MG 2.5 (H) 07/17/2022 0224     Plan: Potassium: K 3.5-3.7:  Give KCl 60 mEq po x1   Magnesium: Mg > 2: No additional supplementation needed  Potassium requirements as inpatient -3/12: K 121meq total -3/13:  49meq (20 tid) -3/14: 90meq -3/15: 130meq  Would consider discharge on Kdur 20meq bid  Thank you for allowing pharmacy to participate in this patient's care   Hildred Laser, PharmD Clinical Pharmacist **Pharmacist phone directory can now be found on [email or web address] ([email or web address]).  Listed under [place].

## 2022-07-17 NOTE — TOC Benefit Eligibility Note (Signed)
Patient Teacher, English as a foreign language completed.    The patient is currently admitted and upon discharge could be taking Enoxaparin  The current 30 day co-pay is $4.00.   The patient is insured through [place]

## 2022-07-18 ENCOUNTER — Encounter (HOSPITAL_COMMUNITY): Payer: Self-pay | Admitting: Cardiovascular Disease

## 2022-07-21 ENCOUNTER — Ambulatory Visit: Payer: Medicaid Other | Attending: Cardiovascular Disease | Admitting: *Deleted

## 2022-07-21 DIAGNOSIS — Q231 Congenital insufficiency of aortic valve: Secondary | ICD-10-CM | POA: Diagnosis not present

## 2022-07-21 DIAGNOSIS — I4892 Unspecified atrial flutter: Secondary | ICD-10-CM | POA: Diagnosis not present

## 2022-07-21 DIAGNOSIS — Z5181 Encounter for therapeutic drug level monitoring: Secondary | ICD-10-CM | POA: Diagnosis not present

## 2022-07-21 LAB — POCT INR: INR: 1.8 — AB (ref 2.0–3.0)

## 2022-07-21 NOTE — Patient Instructions (Signed)
Take warfarin 4mg  today then increase dose to 3mg  daily except 4mg  on Mondays, Wednesdays and Fridays. Recheck INR 1 wk

## 2022-07-23 NOTE — Progress Notes (Signed)
  Electrophysiology Office Note:   Date:  07/24/2022  ID:  JAMESHA MCCOIG, DOB Dec 09, 1960, MRN [id number]  Primary Cardiologist: Jenkins Rouge, MD Electrophysiologist: Cristopher Peru, MD   History of Present Illness:   Mallory Sutton is a 62 y.o. 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    Admitted 3/12 - 3/15 for tikosyn load. Required [facility] but otherwise had unremarkable course  Since discharge from hospital the patient reports doing well from a cardiac perspective. She is fighting a cough/cold that her son gave her. No fever, no syncope. She has had several brief episodes of AF with HRs in 130-140s, but overall has been in NSR and is satisfied with her control.   Review of systems complete and found to be negative unless listed in HPI.   Studies Reviewed:    EKG is ordered today. Personal review shows NSR at 61 bpm with PAC, stable QT interval   Risk Assessment/Calculations:    CHA2DS2-VASc Score = 3      Physical Exam:   VS:  BP 134/62   Pulse 61   Ht 5\' 4"  (1.626 m)   Wt 110 lb (49.9 kg)   LMP 07/16/2013   SpO2 98%   BMI 18.88 kg/m    Wt Readings from Last 3 Encounters:  07/24/22 110 lb (49.9 kg)  07/16/22 110 lb 10.7 oz (50.2 kg)  07/14/22 110 lb 9.6 oz (50.2 kg)     GEN: Well nourished, well developed in no acute distress NECK: No JVD; No carotid bruits CARDIAC: Regular rate and rhythm, no murmurs, rubs, gallops RESPIRATORY:  Clear to auscultation without rales, wheezing or rhonchi  ABDOMEN: Soft, non-tender, non-distended EXTREMITIES:  No edema; No deformity   ASSESSMENT AND PLAN:   Persistent atrial fibrillation EKG today shows NSR with stable QT Continue tikosyn 250 mcg BID Labs today.  Refills sent to [facility] in [place]. She understand she should call the week prior to be sure they have it in  stock. She states she has already discussed it with them so they know to expect a prescription and plan to have it in stock.   Bicuspid AV Stable prior echo Continue coumadin. INR 1.8 3/19 and dose was increased, but was not bridged.   Given recent cardioversion, she will need Lovenox if remains low due to increased risk of CVA. Will check INR today.  She has 4 lovenox shots at home, so has enough to bridge over the weekend if she cannot get to a pharmacy to pick up.   HTN Stable on current regimen   Chronic diastolic CHF Volume status stable on exam.  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 AECOPD vs viral. Son with similar illness. Understands to avoid decongestants, QT prolonging agents including over the counter medications, and to seek care if she has oxygen desaturations despite O2, fever, chills, syncope, or worsening symptoms.   Follow up with EP APP in 4 weeks  Signed, Shirley Friar, PA-C

## 2022-07-24 ENCOUNTER — Other Ambulatory Visit (HOSPITAL_COMMUNITY): Payer: Medicaid Other

## 2022-07-24 ENCOUNTER — Encounter: Payer: Self-pay | Admitting: Student

## 2022-07-24 ENCOUNTER — Ambulatory Visit: Payer: Medicaid Other | Admitting: Student

## 2022-07-24 ENCOUNTER — Ambulatory Visit (HOSPITAL_COMMUNITY): Payer: Medicaid Other

## 2022-07-24 VITALS — BP 134/62 | HR 61 | Ht 64.0 in | Wt 110.0 lb

## 2022-07-24 DIAGNOSIS — Q231 Congenital insufficiency of aortic valve: Secondary | ICD-10-CM | POA: Diagnosis not present

## 2022-07-24 DIAGNOSIS — I4819 Other persistent atrial fibrillation: Secondary | ICD-10-CM | POA: Diagnosis not present

## 2022-07-24 DIAGNOSIS — I4892 Unspecified atrial flutter: Secondary | ICD-10-CM

## 2022-07-24 DIAGNOSIS — I272 Pulmonary hypertension, unspecified: Secondary | ICD-10-CM

## 2022-07-24 DIAGNOSIS — Z952 Presence of prosthetic heart valve: Secondary | ICD-10-CM

## 2022-07-24 LAB — PROTIME-INR
INR: 2.1 — ABNORMAL HIGH (ref 0.9–1.2)
Prothrombin Time: 20.4 s — ABNORMAL HIGH (ref 9.1–12.0)

## 2022-07-24 MED ORDER — DOFETILIDE 250 MCG PO CAPS: 250.0000 ug | ORAL_CAPSULE | Freq: Two times a day (BID) | ORAL | 6 refills | Status: DC

## 2022-07-24 NOTE — Patient Instructions (Signed)
Medication Instructions:  Your physician recommends that you continue on your current medications as directed. Please refer to the Current Medication list given to you today.  *If you need a refill on your cardiac medications before your next appointment, please call your pharmacy*  Lab Work: BMET, Reading today If you have labs (blood work) drawn today and your tests are completely normal, you will receive your results only by: [place] (if you have MyChart) OR A paper copy in the mail If you have any lab test that is abnormal or we need to change your treatment, we will call you to review the results.  Follow-Up: At [facility], you and your health needs are our priority.  As part of our continuing mission to provide you with exceptional heart care, we have created designated Provider Care Teams.  These Care Teams include your primary Cardiologist (physician) and Advanced Practice Providers (APPs -  Physician Assistants and Nurse Practitioners) who all work together to provide you with the care you need, when you need it.  Your next appointment:   1 month(s)  Provider:   Legrand Como "Oda Kilts, PA-C

## 2022-07-25 ENCOUNTER — Encounter: Payer: Self-pay | Admitting: Hematology

## 2022-07-25 LAB — MAGNESIUM: Magnesium: 2.5 mg/dL — ABNORMAL HIGH (ref 1.6–2.3)

## 2022-07-25 LAB — BASIC METABOLIC PANEL
BUN/Creatinine Ratio: 19 (ref 12–28)
BUN: 15 mg/dL (ref 8–27)
CO2: 29 mmol/L (ref 20–29)
Calcium: 9 mg/dL (ref 8.7–10.3)
Chloride: 98 mmol/L (ref 96–106)
Creatinine, Ser: 0.8 mg/dL (ref 0.57–1.00)
Glucose: 75 mg/dL (ref 70–99)
Potassium: 3.1 mmol/L — ABNORMAL LOW (ref 3.5–5.2)
Sodium: 143 mmol/L (ref 134–144)
eGFR: 84 mL/min/{1.73_m2} (ref >59)

## 2022-07-27 ENCOUNTER — Other Ambulatory Visit: Payer: Self-pay

## 2022-07-27 ENCOUNTER — Telehealth: Payer: Self-pay

## 2022-07-27 DIAGNOSIS — Z79899 Other long term (current) drug therapy: Secondary | ICD-10-CM

## 2022-07-27 NOTE — Telephone Encounter (Signed)
Spoke with patient to advise she needs repeat lab work Artist). Patient will go to [facility] office on 07/28/2022.

## 2022-07-28 ENCOUNTER — Other Ambulatory Visit (HOSPITAL_COMMUNITY)
Admission: RE | Admit: 2022-07-28 | Discharge: 2022-07-28 | Disposition: A | Discharge status: Home / Self Care | Payer: Medicaid Other | Source: Ambulatory Visit | Attending: Student | Admitting: Student

## 2022-07-28 DIAGNOSIS — Z79899 Other long term (current) drug therapy: Secondary | ICD-10-CM | POA: Diagnosis present

## 2022-07-28 LAB — BASIC METABOLIC PANEL
Anion gap: 9 (ref 5–15)
BUN: 16 mg/dL (ref 6–20)
CO2: 28 mmol/L (ref 22–32)
Calcium: 8.6 mg/dL — ABNORMAL LOW (ref 8.9–10.3)
Chloride: 102 mmol/L (ref 98–111)
Creatinine, Ser: 0.72 mg/dL (ref 0.44–1.00)
GFR, Estimated: >60 mL/min (ref >60)
Glucose, Bld: 90 mg/dL (ref 70–99)
Potassium: 4.4 mmol/L (ref 3.5–5.1)
Sodium: 139 mmol/L (ref 135–145)

## 2022-07-29 ENCOUNTER — Ambulatory Visit: Payer: Medicaid Other | Attending: Cardiovascular Disease | Admitting: *Deleted

## 2022-07-29 ENCOUNTER — Other Ambulatory Visit: Payer: Self-pay | Admitting: *Deleted

## 2022-07-29 DIAGNOSIS — Z5181 Encounter for therapeutic drug level monitoring: Secondary | ICD-10-CM | POA: Diagnosis not present

## 2022-07-29 DIAGNOSIS — I4892 Unspecified atrial flutter: Secondary | ICD-10-CM

## 2022-07-29 DIAGNOSIS — Q231 Congenital insufficiency of aortic valve: Secondary | ICD-10-CM

## 2022-07-29 LAB — POCT INR: INR: 2.3 (ref 2.0–3.0)

## 2022-07-29 MED ORDER — POTASSIUM CHLORIDE CRYS ER 20 MEQ PO TBCR: 40.0000 meq | EXTENDED_RELEASE_TABLET | Freq: Two times a day (BID) | ORAL | 3 refills | Status: DC

## 2022-07-29 NOTE — Patient Instructions (Signed)
Continue warfarin 3mg  daily except 4mg  on Mondays, Wednesdays and Fridays. Recheck INR 1 wk

## 2022-08-03 ENCOUNTER — Ambulatory Visit: Payer: Medicaid Other | Admitting: Hematology

## 2022-08-06 ENCOUNTER — Encounter: Payer: Self-pay | Admitting: Cardiovascular Disease

## 2022-08-06 NOTE — Telephone Encounter (Signed)
error 

## 2022-08-07 ENCOUNTER — Telehealth: Payer: Self-pay | Admitting: Cardiovascular Disease

## 2022-08-07 ENCOUNTER — Other Ambulatory Visit: Payer: Self-pay | Admitting: Radiology

## 2022-08-07 DIAGNOSIS — C8208 Follicular lymphoma grade I, lymph nodes of multiple sites: Secondary | ICD-10-CM

## 2022-08-07 NOTE — H&P (Incomplete)
Referring Physician(s): Katragadda,Sreedhar  Supervising Physician: Malachy MoanMcCullough, Heath  Patient Status:  [facility]  Chief Complaint:  "I'm getting a bone marrow biopsy and port a cath"  Subjective: Pt known to IR service from left thoracentesis in 2016.  She has a past medical history significant for aortic stenosis, recurrent arrhythmias, bicuspid aortic valve, CHF, coarctation of aorta, endometriosis, paroxysmal atrial fibrillation, thoracic aortic aneurysm and presents now with newly diagnosed follicular lymphoma.  Recent PET scan suggested diffuse FDG activity within the marrow spaces.  She presents today for Port-A-Cath placement to assist with treatment as well as image guided bone marrow biopsy to rule out lymphomatous involvement of the marrow.   Past Medical History:  Diagnosis Date   Aortic stenosis    ATRIAL ARRHYTHMIAS    Bicuspid aortic valve    CHF (congestive heart failure) (HCC)    COARCTATION OF AORTA    ENDOMETRIOSIS    Heart murmur    Paroxysmal atrial fibrillation (HCC)    Shortness of breath    THORACIC AORTIC ANEURYSM    Vasculitis (HCC)    Past Surgical History:  Procedure Laterality Date   CARDIOVERSION N/A 08/29/2019   Procedure: CARDIOVERSION;  Surgeon: Antoine PocheBranch, Jonathan F, MD;  Location: [facility];  Service: Endoscopy;  Laterality: N/A;   CARDIOVERSION N/A 07/16/2022   Procedure: CARDIOVERSION;  Surgeon: Sande Rives'Neal, [place] Thomas, MD;  [facility];  Service: Cardiovascular;  Laterality: N/A;   coarctation repair and residual restenosis     KNEE SURGERY     LYMPH NODE BIOPSY Right 06/25/2022   Procedure: LYMPH NODE BIOPSY; right groin;  Surgeon: Lucretia RoersBridges, Lindsay C, MD;  Location: [facility];  Service: General;  Laterality: Right;   TEE WITHOUT CARDIOVERSION N/A 07/12/2019   Procedure: TRANSESOPHAGEAL ECHOCARDIOGRAM (TEE);  Surgeon: Quintella Reicherturner, Traci R, MD;  [facility];  Service: Cardiovascular;  Laterality: N/A;   TEE WITHOUT CARDIOVERSION N/A  08/29/2019   Procedure: TRANSESOPHAGEAL ECHOCARDIOGRAM (TEE) WITH PROPOFOL;  Surgeon: Antoine PocheBranch, Jonathan F, MD;  Location: [facility];  Service: Endoscopy;  Laterality: N/A;     Allergies: Amiodarone and Penicillins  Medications: Prior to Admission medications   Medication Sig Start Date End Date Taking? Authorizing Provider  acetaminophen (TYLENOL) 325 MG tablet Take 2 tablets (650 mg total) by mouth every 6 (six) hours as needed for mild pain (or Fever >/= 101). 06/27/22   Emokpae, Courage, MD  albuterol (VENTOLIN HFA) 108 (90 Base) MCG/ACT inhaler Inhale 2 puffs into the lungs every 4 (four) hours as needed for wheezing or shortness of breath. 06/27/22   Shon HaleEmokpae, Courage, MD  ALPRAZolam Prudy Feeler(XANAX) 0.5 MG tablet Take 0.5 mg by mouth 3 (three) times daily. 07/01/22   [provider]  aspirin EC 81 MG tablet Take 1 tablet (81 mg total) by mouth daily with breakfast. 06/27/22   Mariea ClontsEmokpae, Courage, MD  Budeson-Glycopyrrol-Formoterol (BREZTRI AEROSPHERE) 160-9-4.8 MCG/ACT AERO Inhale 2 puffs into the lungs 2 (two) times daily. 06/27/22   Shon HaleEmokpae, Courage, MD  calcium-vitamin D (OSCAL WITH D) 500-5 MG-MCG tablet Take 1 tablet by mouth daily. 11/10/21   Vassie LollMadera, Carlos, MD  dofetilide (TIKOSYN) 250 MCG capsule Take 1 capsule (250 mcg total) by mouth 2 (two) times daily. 08/12/22   Graciella Freerillery, Michael Andrew, PA-C  feeding supplement (ENSURE ENLIVE / ENSURE PLUS) LIQD Take 237 mLs by mouth 2 (two) times daily between meals. 11/10/21   Vassie LollMadera, Carlos, MD  furosemide (LASIX) 40 MG tablet Take 2 tablets (80 mg total) by mouth daily. 06/27/22  Shon Hale, MD  gabapentin (NEURONTIN) 300 MG capsule Take 300-600 mg by mouth See admin instructions. Take 300 mg tablet by mouth in the morning, 300 mg tablet by mouth in the afternoon, and then take two of the 300 mg tablets by mouth for total of 600 mg in the evening per patient    [provider]  HYDROcodone-acetaminophen (NORCO/VICODIN) 5-325 MG tablet Take 2  tablets by mouth 2 (two) times daily. 10/31/20   [provider]  ipratropium-albuterol (DUONEB) 0.5-2.5 (3) MG/3ML SOLN Inhale 3 mLs into the lungs 2 (two) times daily as needed (shortness of breath). 06/27/22   Shon Hale, MD  magnesium oxide (MAGOX 400) 400 (240 Mg) MG tablet Take 1 tablet (400 mg total) by mouth daily. 02/06/22   Croitoru, Mihai, MD  metoprolol succinate (TOPROL-XL) 50 MG 24 hr tablet Take 1 tablet (50 mg total) by mouth daily. Take with or immediately following a meal. 06/29/22 06/24/23  Wendall Stade, MD  Multiple Vitamin (MULTI-VITAMINS) TABS Take 1 tablet by mouth daily.     [provider]  pantoprazole (PROTONIX) 40 MG tablet Take 1 tablet (40 mg total) by mouth daily. 06/28/22   Shon Hale, MD  potassium chloride SA (KLOR-CON M) 20 MEQ tablet Take 2 tablets (40 mEq total) by mouth 2 (two) times daily. 07/29/22   Graciella Freer, PA-C  predniSONE (DELTASONE) 20 MG tablet Take 1 tablet (20 mg total) by mouth daily with breakfast. 04/29/22   Wendall Stade, MD  warfarin (COUMADIN) 1 MG tablet Take 3 tablets daily except 4 tablets on Fridays or as directed by coumadin clinic Patient taking differently: Take 1-4 mg by mouth See admin instructions. Take 3 tablets every day except on Fridays take four of the 1 mg tablets to make full dose of 4 mg or as directed by coumadin clinic 06/23/22   Wendall Stade, MD  zolpidem (AMBIEN) 10 MG tablet Take 1 tablet (10 mg total) by mouth at bedtime as needed for sleep. Patient taking differently: Take 5 mg by mouth at bedtime as needed for sleep. 06/27/22 07/27/22  Shon Hale, MD     Vital Signs: LMP 07/16/2013    Code Status:   Physical Exam  Imaging: No results found.  Labs:  CBC: Recent Labs    06/26/22 0633 06/27/22 0245 07/16/22 0833 07/17/22 0224  WBC 17.1* 15.7* 15.8* 13.4*  HGB 9.8* 9.6* 9.9* 7.9*  HCT 33.1* 31.7* 32.0* 25.0*  PLT 279 299 376 319    COAGS: Recent Labs     11/08/21 1813 11/09/21 0640 07/17/22 0224 07/21/22 1539 07/24/22 1304 07/29/22 1530  INR 2.1*   < > 2.1* 1.8* 2.1* 2.3  APTT 29  --   --   --   --   --    < > = values in this interval not displayed.    BMP: Recent Labs    07/15/22 0207 07/16/22 0833 07/17/22 0224 07/24/22 1303 07/28/22 1522  NA 137 139 136 143 139  K 4.5 4.2 3.6 3.1* 4.4  CL 99 101 100 98 102  CO2 31 30 29 29 28   GLUCOSE 117* 98 93 75 90  BUN 17 20 22* 15 16  CALCIUM 8.7* 9.0 8.4* 9.0 8.6*  CREATININE 0.97 0.98 1.03* 0.80 0.72  GFRNONAA >60 >60 >60  --  >60    LIVER FUNCTION TESTS: Recent Labs    11/09/21 0640 04/08/22 1225 06/23/22 1417 06/23/22 1900  BILITOT 0.6 0.5 0.6 0.8  AST 31 21 38 35  ALT 20 12 28 27   ALKPHOS 67 84 89 86  PROT 5.7* 6.8 7.4 7.2  ALBUMIN 3.1* 3.9 4.2 4.1    Assessment and Plan: 62 yo female with past medical history significant for aortic stenosis, recurrent arrhythmias, bicuspid aortic valve, CHF, coarctation of aorta, endometriosis, paroxysmal atrial fibrillation, thoracic aortic aneurysm and presents now with newly diagnosed follicular lymphoma.  Recent PET scan suggested diffuse FDG activity within the marrow spaces.  She presents today for Port-A-Cath placement to assist with treatment as well as image guided bone marrow biopsy to rule out lymphomatous involvement of the marrow.   Electronically Signed: D. Jeananne RamaKevin Ingram Onnen, PA-C 08/07/2022, 2:48 PM   I spent a total of 25 minutes at the the patient's bedside AND on the patient's hospital floor or unit, greater than 50% of which was counseling/coordinating care for image guided bone marrow biopsy and Port-A-Cath placement

## 2022-08-07 NOTE — Telephone Encounter (Signed)
The patient called to ask Dr. Eden Emms about holding warfarin for port placement and bone marrow biopsy scheduled for Monday 08/10/22.  Dr. Eden Emms reviewed chart including recent hospital admission to start Tikosyn.  Patient reports it was 3 weeks yesterday that she has been on warfarin this time.  Per Dr. Eden Emms, okay for patient to hold warfarin Sunday and then she will need INR checked when she gets to [facility] on Monday for procedures.    Called patient and let her know.  She still is asking are you sure because I definitely don't want to have a stroke.  Explained that is why Dr. Eden Emms only wants her to hold it warfarin for one day.  She is pleased and in agreement with this plan.  She will make sure they know to check iNR Monday before starting.

## 2022-08-07 NOTE — Telephone Encounter (Signed)
Pt would like a callback from nurse regarding procedure and medication. Please advise

## 2022-08-10 ENCOUNTER — Encounter (HOSPITAL_COMMUNITY): Payer: Self-pay

## 2022-08-10 ENCOUNTER — Ambulatory Visit (HOSPITAL_COMMUNITY)
Admission: RE | Admit: 2022-08-10 | Discharge: 2022-08-10 | Disposition: A | Discharge status: Home / Self Care | Payer: Medicaid Other | Source: Ambulatory Visit | Attending: Hematology | Admitting: Hematology

## 2022-08-10 VITALS — BP 151/50 | HR 71 | Temp 97.8°F | Resp 16

## 2022-08-10 DIAGNOSIS — I509 Heart failure, unspecified: Secondary | ICD-10-CM | POA: Insufficient documentation

## 2022-08-10 DIAGNOSIS — Q251 Coarctation of aorta: Secondary | ICD-10-CM | POA: Insufficient documentation

## 2022-08-10 DIAGNOSIS — C8208 Follicular lymphoma grade I, lymph nodes of multiple sites: Secondary | ICD-10-CM

## 2022-08-10 DIAGNOSIS — I4819 Other persistent atrial fibrillation: Secondary | ICD-10-CM

## 2022-08-10 DIAGNOSIS — Z8679 Personal history of other diseases of the circulatory system: Secondary | ICD-10-CM | POA: Insufficient documentation

## 2022-08-10 DIAGNOSIS — I350 Nonrheumatic aortic (valve) stenosis: Secondary | ICD-10-CM | POA: Diagnosis not present

## 2022-08-10 DIAGNOSIS — C8200 Follicular lymphoma grade I, unspecified site: Secondary | ICD-10-CM

## 2022-08-10 DIAGNOSIS — I480 Paroxysmal atrial fibrillation: Secondary | ICD-10-CM | POA: Diagnosis not present

## 2022-08-10 HISTORY — PX: IR BONE MARROW BIOPSY & ASPIRATION: IMG5727

## 2022-08-10 HISTORY — PX: IR IMAGING GUIDED PORT INSERTION: IMG5740

## 2022-08-10 HISTORY — DX: Cardiac arrhythmia, unspecified: I49.9

## 2022-08-10 LAB — CBC WITH DIFFERENTIAL/PLATELET
Abs Immature Granulocytes: 0.15 10*3/uL — ABNORMAL HIGH (ref 0.00–0.07)
Basophils Absolute: 0.1 10*3/uL (ref 0.0–0.1)
Basophils Relative: 0 %
Eosinophils Absolute: 0.1 10*3/uL (ref 0.0–0.5)
Eosinophils Relative: 1 %
HCT: 32.9 % — ABNORMAL LOW (ref 36.0–46.0)
Hemoglobin: 10.2 g/dL — ABNORMAL LOW (ref 12.0–15.0)
Immature Granulocytes: 1 %
Lymphocytes Relative: 46 %
Lymphs Abs: 7.7 10*3/uL — ABNORMAL HIGH (ref 0.7–4.0)
MCH: 25.2 pg — ABNORMAL LOW (ref 26.0–34.0)
MCHC: 31 g/dL (ref 30.0–36.0)
MCV: 81.4 fL (ref 80.0–100.0)
Monocytes Absolute: 1.3 10*3/uL — ABNORMAL HIGH (ref 0.1–1.0)
Monocytes Relative: 8 %
Neutro Abs: 7.3 10*3/uL (ref 1.7–7.7)
Neutrophils Relative %: 44 %
Platelets: 385 10*3/uL (ref 150–400)
RBC: 4.04 MIL/uL (ref 3.87–5.11)
RDW: 15 % (ref 11.5–15.5)
WBC: 16.6 10*3/uL — ABNORMAL HIGH (ref 4.0–10.5)
nRBC: 0 % (ref 0.0–0.2)

## 2022-08-10 MED ORDER — LIDOCAINE HCL (PF) 1 % IJ SOLN
INTRAMUSCULAR | Status: AC
  Filled 2022-08-10: qty 30

## 2022-08-10 MED ORDER — MIDAZOLAM HCL 2 MG/2ML IJ SOLN
INTRAMUSCULAR | Status: AC
  Filled 2022-08-10: qty 6

## 2022-08-10 MED ORDER — FENTANYL CITRATE (PF) 100 MCG/2ML IJ SOLN
INTRAMUSCULAR | Status: AC | PRN
  Administered 2022-08-10 (×4): 50 ug via INTRAVENOUS

## 2022-08-10 MED ORDER — MIDAZOLAM HCL 2 MG/2ML IJ SOLN
INTRAMUSCULAR | Status: AC | PRN
  Administered 2022-08-10 (×5): 1 mg via INTRAVENOUS

## 2022-08-10 MED ORDER — HEPARIN SOD (PORK) LOCK FLUSH 100 UNIT/ML IV SOLN
INTRAVENOUS | Status: AC | PRN
  Administered 2022-08-10: 500 [IU] via INTRAVENOUS

## 2022-08-10 MED ORDER — LIDOCAINE-EPINEPHRINE 1 %-1:100000 IJ SOLN
13.0000 mL | Freq: Once | INTRAMUSCULAR | Status: AC
  Administered 2022-08-10: 13 mL via INTRADERMAL

## 2022-08-10 MED ORDER — LIDOCAINE-EPINEPHRINE 1 %-1:100000 IJ SOLN
INTRAMUSCULAR | Status: AC
  Filled 2022-08-10: qty 1

## 2022-08-10 MED ORDER — FENTANYL CITRATE (PF) 100 MCG/2ML IJ SOLN
INTRAMUSCULAR | Status: AC
  Filled 2022-08-10: qty 4

## 2022-08-10 MED ORDER — SODIUM CHLORIDE 0.9 % IV SOLN: INTRAVENOUS | Status: DC

## 2022-08-10 MED ORDER — HEPARIN SOD (PORK) LOCK FLUSH 100 UNIT/ML IV SOLN
INTRAVENOUS | Status: AC
  Filled 2022-08-10: qty 5

## 2022-08-10 MED ORDER — LIDOCAINE HCL (PF) 1 % IJ SOLN
10.0000 mL | Freq: Once | INTRAMUSCULAR | Status: AC
  Administered 2022-08-10: 10 mL via INTRADERMAL

## 2022-08-10 NOTE — Progress Notes (Signed)
Patient ID: Mallory EdwardsSusan H Sutton, female   DOB: Sep 26, 1960, 62 y.o.   MRN: [id number]      62 y.o. long term patient followed since 1999. She has had congenital heart disease with coarctation repair at age 89 via left thoracotomy. Known persistent left sided SVC  Followed for long time for bicuspid AV and aortic aneurysm Finally had AVR and arch repair by Dr Kizzie BaneHughes in 2016. I was upset that he did not bypass her coarctation at the time She had PAF and MAZE with LAA occlusion. She has been stable on propafenone for years and followed by Dr Ladona Ridgelaylor in [place]  She developed a neuropathy and painful ? Vascular rash in her legs 2019 with short course of steroids.   Recent US RLE with complex cystic lesion in posterior upper left thigh   TEE done 08/29/19 showed normal EF she had no AR and mean gradient across her AVR 14 mmHg. She has not had recent MRI/CT imaging of her coarctation but the MLD had been < 1 cm in past     Her care has been compromised in past due to lack of insurance and financial issues She has been sporadic at times with her INR checks being subRx the last month   Vasculitis does not seem well Rx Still with lots of discoloration and some pain in legs Also developing more cysts She needs to have on removed by Dr Henreitta LeberBridges from back of right thigh as it is pressing on nerve Also has one above right knee with lateral knee effusion Had steroid injection right knee 11/01/20 Seen by rheumatology DR Dimple Caseyice 12/31/20  Labs including ESR, ANCA Cryoglobulin and cardiolipin negative   TTE:  07/20/21  EF 60-65% Normal RV mild MR AVR On-X 19 mm mean gradient 29 mmHg trivial PVL DVI 0.44  CXR 07/16/21 Emphysema NAD   BNP 07/20/21 437 BUN/Cr BUN 25 Cr 1.19 K 3.8  Hct 32.5  INR 2.5   Hospitalized 3/15-21/23 for hypoxemic respiratory failure COPD She is a non smoker Wood stove for heat D/c on 2L's oxygen Symbicort and Spiriva Plan to do walk test and consider nocturnal oxymetry She was in NSR throughout and d/c with lasix  80 mg daily   08/15/21 emotionally a wreck She feels helpless with another medical issue and being tied to oxygen. Her husband Mallory Sutton is being ugly to her in regard to living his life in the shadow of all her medical problems and he is getting too old to do all his landscaping work and finances have always been tight. I spoke to her daughter Mallory Sutton at that time and son lives with them   Seen by Dr Francine Gravenewald 10/06/21 PFTls with severe obstruction and moderate diffusion defect Still on 2 L's oxygen   Continues to struggle with vasculitis/pain in arms/legs and dyspnea with her lung dx DLCO is < 50% and FEV1 < 1 Now using 3L' [place]  Recent ANCA negative as was Histone Ab and ANA   She is emotional again today PAF at night better and likely related to anxiety and hypoxia. Has lost 20 lbs since April Frustrated with lack of diagnosis with lungs   Despite increasing her rhythmol she has had more PAF [facility] [place] [facility]   Had a lymph node biopsy in groin after PET /CT suggested lymphoma. Surgical path is pending. 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 Port a cath placed 08/10/22   Spirits better. Port a cath still hurts from scar tissue.   ROS: Denies fever, malais, weight loss, blurry vision, decreased visual acuity, cough, sputum, SOB, hemoptysis, pleuritic pain, palpitaitons, heartburn, abdominal pain, melena, lower extremity edema, claudication, or rash.  All other systems reviewed and negative  General: Vitals:   08/24/22 1048  BP: (!) 148/74  Pulse: (!) 58  SpO2: 98%      BP (!) 148/74   Pulse (!) 58   Ht 5\' 4"  (1.626 m)   Wt 111 lb 3.2 oz (50.4 kg)   LMP 07/16/2013   SpO2 98%   BMI  19.09 kg/m    Affect appropriate Thin chronically ill  HEENT: normal Neck supple with no adenopathy Port a cath new  Lungs clear with no wheezing and good diaphragmatic motion Heart:  S1/S2 click SEM through AVR  No AR  murmur, no rub, gallop or click PMI normal Post left thoracotomy and sternotomy. Coarctation murmur radiates to back  Abdomen: benighn, BS positve, no tenderness, no AAA no bruit.  No HSM or HJR post biopsy right groin healing  Distal pulses intact with no bruits Post surgery age 62 left knee and bilateral knee swelling  Neuro non-focal Skin vasculitic rash arms and legs  Cystic mass back of right thigh With right knee effusion and cyst above knee     Current Outpatient Medications  Medication Sig Dispense Refill   acetaminophen (TYLENOL) 325 MG tablet Take 2 tablets (650 mg total) by mouth every 6 (six) hours as needed for mild pain (or Fever >/= 101). 100 tablet 1   albuterol (VENTOLIN HFA) 108 (90 Base) MCG/ACT inhaler Inhale 2 puffs into the lungs every 4 (four) hours as needed for wheezing or shortness of breath. 54 g 6   ALPRAZolam (XANAX) 0.5 MG tablet Take 0.5 mg by mouth 3 (three) times daily.     aspirin EC 81 MG tablet Take 1 tablet (81 mg total) by mouth daily with breakfast. 30 tablet 11   Budeson-Glycopyrrol-Formoterol (BREZTRI AEROSPHERE) 160-9-4.8 MCG/ACT AERO Inhale 2 puffs into the lungs 2 (two) times daily. 10.7 g 1   calcium-vitamin D (OSCAL WITH D) 500-5 MG-MCG tablet Take 1 tablet by mouth daily. 30 tablet 2   dofetilide (TIKOSYN) 250 MCG capsule Take 1 capsule (250 mcg total) by mouth 2 (two) times daily. 60 capsule 6   feeding supplement (ENSURE ENLIVE / ENSURE PLUS) LIQD Take 237 mLs by mouth 2 (two) times daily between meals.     furosemide (LASIX) 40 MG tablet Take 2 tablets (80 mg total) by mouth daily. 30 tablet 3   gabapentin (NEURONTIN) 300 MG capsule Take 300-600 mg by mouth See admin instructions. Take 300 mg tablet by mouth in the  morning, 300 mg tablet by mouth in the afternoon, and then take two of the 300 mg tablets by mouth for total of 600 mg in the evening per patient     HYDROcodone-acetaminophen (NORCO/VICODIN) 5-325 MG tablet Take 2 tablets by mouth 2 (two) times daily.     ipratropium-albuterol (DUONEB) 0.5-2.5 (3) MG/3ML SOLN Inhale 3 mLs into the lungs 2 (two) times daily as needed (shortness of breath). 360 mL 1  magnesium oxide (MAGOX 400) 400 (240 Mg) MG tablet Take 1 tablet (400 mg total) by mouth daily. 30 tablet 2   metoprolol succinate (TOPROL-XL) 25 MG 24 hr tablet Take 1 tablet (25 mg total) by mouth daily. 30 tablet 0   metoprolol succinate (TOPROL-XL) 50 MG 24 hr tablet Take 1 tablet (50 mg total) by mouth daily. Take with or immediately following a meal. 30 tablet 11   Multiple Vitamin (MULTI-VITAMINS) TABS Take 1 tablet by mouth daily.      pantoprazole (PROTONIX) 40 MG tablet Take 1 tablet (40 mg total) by mouth daily. 30 tablet 2   potassium chloride SA (KLOR-CON M) 20 MEQ tablet Take 2 tablets (40 mEq total) by mouth 2 (two) times daily. 120 tablet 3   predniSONE (DELTASONE) 20 MG tablet Take 1 tablet (20 mg total) by mouth daily with breakfast. 90 tablet 3   venlafaxine (EFFEXOR) 75 MG tablet Take 75 mg by mouth 2 (two) times daily.     warfarin (COUMADIN) 1 MG tablet Take 3 tablets daily except 4 tablets on Fridays or as directed by coumadin clinic (Patient taking differently: Take 1-4 mg by mouth See admin instructions. Take 3 tablets every day except on Fridays take four of the 1 mg tablets to make full dose of 4 mg or as directed by coumadin clinic) 100 tablet 5   zolpidem (AMBIEN) 10 MG tablet Take 1 tablet (10 mg total) by mouth at bedtime as needed for sleep. (Patient taking differently: Take 5 mg by mouth at bedtime as needed for sleep.) 10 tablet 0   No current facility-administered medications for this visit.    Allergies  Amiodarone and Penicillins  Electrocardiogram:  Afib rate  150 QT normal   Assessment and Plan  PAF:  2016 post MAZE/LAA exclusion increased episodes of PAF/Flutter Rhythmol dose increased [facility] 06/27/22 Recurrent and now post Tikosyn load with [facility] 07/16/22 Maintaining NSR May have some break through PAF So long as not symptomatic and rates ok no issues getting chemo even if in afib AVR: Normal function on TTE 07/20/21    Coarctation:  Disagree with decision make to not repair coarctation at time of surgery for aneurysm and bicuspid valve at some point will need f/u MRI/MRA  HTN:  Well controlled.  Continue current medications and low sodium Dash type diet.   Aneursym:  In setting of bicuspid valve post  arch repair with graft.  Neuropathy/Rash:  Bilateral painful legs. F/U Rheumatology as her vasculitis may not be ideally Rx She only takes pulsed steroids. She is afraid to take MTX. Given worsening lung status will prescribe 20 mg of prednisone and have her f/u with Derek Jack and Jeanella Anton to discuss further  COPD:  ? From 2nd hand smoke and wood burning stove non smoker F/U pulmonary continue inhalers and oxygen PFTls,severe obstruction moderate diffusion defect f/u Dr Francine Graven  See below  Edema:  don't think it is cardiac EF normal RV normal no evidence of pulmonary hypertension She has had some edema since diagnosis of vasculitis US LE;s negative DVT in 2021  she has been on Rx coumadin for her valve and DVT unlikely PRN lasix  Lymphoma:  abnormal PET/CT 06/18/22 post right inguinal node biopsy Path pending f/u with pulmonary and oncology I suspect that her vasculitis is associated with her lymphoproliferative dx and just pre dated the diagnosis Now with port a cath in place Started RX with Bendamustine and rituximab F/U Ellin Saba  Anxiety/Depression:  ? Start Paxil  in place of Zoloft Has xanax and is doing much better now    F/U with me in 6 months   Charlton Haws

## 2022-08-10 NOTE — Sedation Documentation (Signed)
End bone marrow

## 2022-08-10 NOTE — Sedation Documentation (Signed)
Start port

## 2022-08-12 LAB — SURGICAL PATHOLOGY

## 2022-08-13 ENCOUNTER — Ambulatory Visit: Payer: Medicaid Other | Attending: Cardiovascular Disease | Admitting: *Deleted

## 2022-08-13 DIAGNOSIS — Q231 Congenital insufficiency of aortic valve: Secondary | ICD-10-CM | POA: Diagnosis not present

## 2022-08-13 DIAGNOSIS — I4892 Unspecified atrial flutter: Secondary | ICD-10-CM | POA: Diagnosis not present

## 2022-08-13 DIAGNOSIS — Z5181 Encounter for therapeutic drug level monitoring: Secondary | ICD-10-CM | POA: Diagnosis not present

## 2022-08-13 LAB — POCT INR: INR: 1.7 — AB (ref 2.0–3.0)

## 2022-08-13 NOTE — Patient Instructions (Signed)
Take warfarin 5 tablets tonight then resume 3mg  daily except 4mg  on Mondays, Wednesdays and Fridays. Recheck INR 10 days

## 2022-08-17 ENCOUNTER — Encounter (HOSPITAL_COMMUNITY): Payer: Self-pay | Admitting: *Deleted

## 2022-08-17 ENCOUNTER — Emergency Department (HOSPITAL_COMMUNITY)
Admission: EM | Admit: 2022-08-17 | Discharge: 2022-08-17 | Disposition: A | Discharge status: Home / Self Care | Payer: Medicaid Other | Attending: Emergency Medicine | Admitting: Emergency Medicine

## 2022-08-17 ENCOUNTER — Telehealth: Payer: Self-pay | Admitting: Internal Medicine

## 2022-08-17 ENCOUNTER — Ambulatory Visit: Payer: Medicaid Other | Admitting: Cardiovascular Disease

## 2022-08-17 ENCOUNTER — Emergency Department (HOSPITAL_COMMUNITY): Payer: Medicaid Other

## 2022-08-17 ENCOUNTER — Other Ambulatory Visit: Payer: Self-pay

## 2022-08-17 DIAGNOSIS — Z7901 Long term (current) use of anticoagulants: Secondary | ICD-10-CM | POA: Diagnosis not present

## 2022-08-17 DIAGNOSIS — Z7982 Long term (current) use of aspirin: Secondary | ICD-10-CM | POA: Diagnosis not present

## 2022-08-17 DIAGNOSIS — I4891 Unspecified atrial fibrillation: Secondary | ICD-10-CM | POA: Diagnosis not present

## 2022-08-17 DIAGNOSIS — R0602 Shortness of breath: Secondary | ICD-10-CM | POA: Diagnosis present

## 2022-08-17 LAB — BASIC METABOLIC PANEL
Anion gap: 8 (ref 5–15)
BUN: 22 mg/dL — ABNORMAL HIGH (ref 6–20)
CO2: 30 mmol/L (ref 22–32)
Calcium: 8.6 mg/dL — ABNORMAL LOW (ref 8.9–10.3)
Chloride: 99 mmol/L (ref 98–111)
Creatinine, Ser: 0.86 mg/dL (ref 0.44–1.00)
GFR, Estimated: >60 mL/min (ref >60)
Glucose, Bld: 112 mg/dL — ABNORMAL HIGH (ref 70–99)
Potassium: 3.4 mmol/L — ABNORMAL LOW (ref 3.5–5.1)
Sodium: 137 mmol/L (ref 135–145)

## 2022-08-17 LAB — CBC
HCT: 36.5 % (ref 36.0–46.0)
Hemoglobin: 11.2 g/dL — ABNORMAL LOW (ref 12.0–15.0)
MCH: 25.1 pg — ABNORMAL LOW (ref 26.0–34.0)
MCHC: 30.7 g/dL (ref 30.0–36.0)
MCV: 81.8 fL (ref 80.0–100.0)
Platelets: 448 10*3/uL — ABNORMAL HIGH (ref 150–400)
RBC: 4.46 MIL/uL (ref 3.87–5.11)
RDW: 15.5 % (ref 11.5–15.5)
WBC: 25 10*3/uL — ABNORMAL HIGH (ref 4.0–10.5)
nRBC: 0 % (ref 0.0–0.2)

## 2022-08-17 LAB — MAGNESIUM: Magnesium: 2.5 mg/dL — ABNORMAL HIGH (ref 1.7–2.4)

## 2022-08-17 LAB — PROTIME-INR
INR: 1.7 — ABNORMAL HIGH (ref 0.8–1.2)
Prothrombin Time: 19.7 seconds — ABNORMAL HIGH (ref 11.4–15.2)

## 2022-08-17 MED ORDER — METOPROLOL SUCCINATE ER 25 MG PO TB24: 25.0000 mg | ORAL_TABLET | Freq: Every day | ORAL | 0 refills | Status: DC

## 2022-08-17 MED ORDER — POTASSIUM CHLORIDE CRYS ER 20 MEQ PO TBCR
40.0000 meq | EXTENDED_RELEASE_TABLET | Freq: Once | ORAL | Status: AC
  Administered 2022-08-17: 40 meq via ORAL
  Filled 2022-08-17: qty 2

## 2022-08-17 MED ORDER — OXYCODONE-ACETAMINOPHEN 5-325 MG PO TABS
1.0000 | ORAL_TABLET | Freq: Once | ORAL | Status: AC
  Administered 2022-08-17: 1 via ORAL
  Filled 2022-08-17: qty 1

## 2022-08-17 NOTE — Telephone Encounter (Signed)
Opened in error.Marland KitchenMarland KitchenNYRN

## 2022-08-17 NOTE — Progress Notes (Deleted)
No chief complaint on file.  History of Present Illness: 62 yo female with history of congenital heart disease with coarctation repair at age 47, persistent left sided SVC, bicuspid aortic stenosis s/p AVR and aortic arch repair in 2016, paroxysmal atrial fibrillation s/p MAZE and LAA occlusion here today as urgent add on for follow up. She called our office stating she was in rapid atrial fib. Her atrial fib has been followed in our EP clinic by Dr. Ladona Ridgel. She has been on Tikosyn, Toprol and coumadin. Her Tikosyn was started in March 2024. She was cardioverted to sinus in march 2024. Last echo in November 2023 with LVEF=65-70%, moderate LVH. Mild LAE. Mild MR. Mild AI and mean gradient of 28 mm Hg across the aortic valve.   She reports being compliant with coumadin and Tikosyn.   Primary Care Physician: Leone Payor, FNP   Past Medical History:  Diagnosis Date   Aortic stenosis    ATRIAL ARRHYTHMIAS    Bicuspid aortic valve    CHF (congestive heart failure)    COARCTATION OF AORTA    Dysrhythmia    ENDOMETRIOSIS    Heart murmur    Paroxysmal atrial fibrillation    Shortness of breath    THORACIC AORTIC ANEURYSM    Vasculitis     Past Surgical History:  Procedure Laterality Date   CARDIOVERSION N/A 08/29/2019   Procedure: CARDIOVERSION;  Surgeon: Antoine Poche, MD;  Location: [facility];  Service: Endoscopy;  Laterality: N/A;   CARDIOVERSION N/A 07/16/2022   Procedure: CARDIOVERSION;  Surgeon: Sande Rives, MD;  Location: [facility] ENDOSCOPY;  Service: Cardiovascular;  Laterality: N/A;   coarctation repair and residual restenosis     IR BONE MARROW BIOPSY & ASPIRATION  08/10/2022   IR IMAGING GUIDED PORT INSERTION  08/10/2022   KNEE SURGERY     LYMPH NODE BIOPSY Right 06/25/2022   Procedure: LYMPH NODE BIOPSY; right groin;  Surgeon: Lucretia Roers, MD;  Location: [facility];  Service: General;  Laterality: Right;   TEE WITHOUT CARDIOVERSION N/A 07/12/2019   Procedure:  TRANSESOPHAGEAL ECHOCARDIOGRAM (TEE);  Surgeon: Quintella Reichert, MD;  Location: [facility] ENDOSCOPY;  Service: Cardiovascular;  Laterality: N/A;   TEE WITHOUT CARDIOVERSION N/A 08/29/2019   Procedure: TRANSESOPHAGEAL ECHOCARDIOGRAM (TEE) WITH PROPOFOL;  Surgeon: Antoine Poche, MD;  Location: [facility];  Service: Endoscopy;  Laterality: N/A;    Current Outpatient Medications  Medication Sig Dispense Refill   acetaminophen (TYLENOL) 325 MG tablet Take 2 tablets (650 mg total) by mouth every 6 (six) hours as needed for mild pain (or Fever >/= 101). 100 tablet 1   albuterol (VENTOLIN HFA) 108 (90 Base) MCG/ACT inhaler Inhale 2 puffs into the lungs every 4 (four) hours as needed for wheezing or shortness of breath. 54 g 6   ALPRAZolam (XANAX) 0.5 MG tablet Take 0.5 mg by mouth 3 (three) times daily.     aspirin EC 81 MG tablet Take 1 tablet (81 mg total) by mouth daily with breakfast. 30 tablet 11   Budeson-Glycopyrrol-Formoterol (BREZTRI AEROSPHERE) 160-9-4.8 MCG/ACT AERO Inhale 2 puffs into the lungs 2 (two) times daily. 10.7 g 1   calcium-vitamin D (OSCAL WITH D) 500-5 MG-MCG tablet Take 1 tablet by mouth daily. 30 tablet 2   dofetilide (TIKOSYN) 250 MCG capsule Take 1 capsule (250 mcg total) by mouth 2 (two) times daily. 60 capsule 6   feeding supplement (ENSURE ENLIVE / ENSURE PLUS) LIQD Take 237 mLs by mouth 2 (two) times  daily between meals.     furosemide (LASIX) 40 MG tablet Take 2 tablets (80 mg total) by mouth daily. 30 tablet 3   gabapentin (NEURONTIN) 300 MG capsule Take 300-600 mg by mouth See admin instructions. Take 300 mg tablet by mouth in the morning, 300 mg tablet by mouth in the afternoon, and then take two of the 300 mg tablets by mouth for total of 600 mg in the evening per patient     HYDROcodone-acetaminophen (NORCO/VICODIN) 5-325 MG tablet Take 2 tablets by mouth 2 (two) times daily.     ipratropium-albuterol (DUONEB) 0.5-2.5 (3) MG/3ML SOLN Inhale 3 mLs into the lungs 2 (two) times  daily as needed (shortness of breath). 360 mL 1   magnesium oxide (MAGOX 400) 400 (240 Mg) MG tablet Take 1 tablet (400 mg total) by mouth daily. 30 tablet 2   metoprolol succinate (TOPROL-XL) 50 MG 24 hr tablet Take 1 tablet (50 mg total) by mouth daily. Take with or immediately following a meal. 30 tablet 11   Multiple Vitamin (MULTI-VITAMINS) TABS Take 1 tablet by mouth daily.      pantoprazole (PROTONIX) 40 MG tablet Take 1 tablet (40 mg total) by mouth daily. 30 tablet 2   potassium chloride SA (KLOR-CON M) 20 MEQ tablet Take 2 tablets (40 mEq total) by mouth 2 (two) times daily. 120 tablet 3   predniSONE (DELTASONE) 20 MG tablet Take 1 tablet (20 mg total) by mouth daily with breakfast. 90 tablet 3   venlafaxine (EFFEXOR) 75 MG tablet Take 75 mg by mouth 2 (two) times daily.     warfarin (COUMADIN) 1 MG tablet Take 3 tablets daily except 4 tablets on Fridays or as directed by coumadin clinic (Patient taking differently: Take 1-4 mg by mouth See admin instructions. Take 3 tablets every day except on Fridays take four of the 1 mg tablets to make full dose of 4 mg or as directed by coumadin clinic) 100 tablet 5   zolpidem (AMBIEN) 10 MG tablet Take 1 tablet (10 mg total) by mouth at bedtime as needed for sleep. (Patient taking differently: Take 5 mg by mouth at bedtime as needed for sleep.) 10 tablet 0   No current facility-administered medications for this visit.    Allergies  Allergen Reactions   Amiodarone Nausea Only   Penicillins Hives    Has patient had a PCN reaction causing immediate rash, facial/tongue/throat swelling, SOB or lightheadedness with hypotension: YES Has patient had a PCN reaction causing severe rash involving mucus membranes or skin necrosis: NO Has patient had a PCN reaction that required hospitalization NO Has patient had a PCN reaction occurring within the last 10 years: NO If all of the above answers are "NO", then may proceed with Cephalosporin use.     Social  History   Socioeconomic History   Marital status: Married    Spouse name: Not on file   Number of children: Not on file   Years of education: Not on file   Highest education level: Not on file  Occupational History   Not on file  Tobacco Use   Smoking status: Never    Passive exposure: Past   Smokeless tobacco: Never   Tobacco comments:    Never smoke 07/14/22  Vaping Use   Vaping Use: Never used  Substance and Sexual Activity   Alcohol use: Yes    Comment: 3 beers 3 nights a week 07/14/22   Drug use: No   Sexual activity: Never  Other Topics Concern   Not on file  Social History Narrative   Not on file   Social Determinants of Health   Financial Resource Strain: High Risk (07/02/2022)   Overall Financial Resource Strain (CARDIA)    Difficulty of Paying Living Expenses: Very hard  Food Insecurity: Food Insecurity Present (07/15/2022)   Hunger Vital Sign    Worried About Running Out of Food in the Last Year: Sometimes true    Ran Out of Food in the Last Year: Sometimes true  Transportation Needs: No Transportation Needs (07/17/2022)   PRAPARE - Administrator, Civil Service (Medical): No    Lack of Transportation (Non-Medical): No  Physical Activity: Not on file  Stress: Not on file  Social Connections: Not on file  Intimate Partner Violence: Not At Risk (07/17/2022)   Humiliation, Afraid, Rape, and Kick questionnaire    Fear of Current or Ex-Partner: No    Emotionally Abused: No    Physically Abused: No    Sexually Abused: No    Family History  Problem Relation Age of Onset   Suicidality Mother    Mental illness Mother    Drug abuse Brother    Healthy Daughter    Healthy Son     Review of Systems:  As stated in the HPI and otherwise negative.   LMP 07/16/2013   Physical Examination: General: Well developed, well nourished, NAD  HEENT: OP clear, mucus membranes moist  SKIN: warm, dry. No rashes. Neuro: No focal deficits  Musculoskeletal:  Muscle strength 5/5 all ext  Psychiatric: Mood and affect normal  Neck: No JVD, no carotid bruits, no thyromegaly, no lymphadenopathy.  Lungs:Clear bilaterally, no wheezes, rhonci, crackles Cardiovascular: Regular rate and rhythm. No murmurs, gallops or rubs. Abdomen:Soft. Bowel sounds present. Non-tender.  Extremities: No lower extremity edema. Pulses are 2 + in the bilateral DP/PT.  EKG:  EKG {ACTION; IS/IS [id number]} ordered today. The ekg ordered today demonstrates ***  Recent Labs: 06/23/2022: ALT 27 07/24/2022: Magnesium 2.5 07/28/2022: BUN 16; Creatinine, Ser 0.72; Potassium 4.4; Sodium 139 08/10/2022: Hemoglobin 10.2; Platelets 385   Lipid Panel    Component Value Date/Time   CHOL 144 01/23/2015 0913   TRIG 87 01/23/2015 0913   HDL 44 01/23/2015 0913   CHOLHDL 3.3 01/23/2015 0913   VLDL 17 01/23/2015 0913   LDLCALC 83 01/23/2015 0913     Wt Readings from Last 3 Encounters:  07/24/22 49.9 kg  07/16/22 50.2 kg  07/14/22 50.2 kg      Assessment and Plan:   1.   Labs/ tests ordered today include:  No orders of the defined types were placed in this encounter.    Disposition:   F/U with me in ***    Signed, Verne Carrow, MD, [facility] 08/17/2022 10:39 AM    [facility] [address] [place], [place], [place]  [address] Phone: [phone]; Fax: [phone]

## 2022-08-17 NOTE — ED Provider Notes (Signed)
[facility]   CSN: [id number] Arrival date & time: 08/17/22  1416     History {Add pertinent medical, surgical, social history, OB history to HPI:1} Chief Complaint  Patient presents with   Atrial Fibrillation    Mallory Sutton is a 62 y.o. female.  She has a remote history of a coarct repair, has been in A-fib for 20 years.  She said she was well-controlled on propafenone for a while but recently it did not work and they converted her over to [employer].  She said she was in sinus rhythm up until 3 days ago when she began experiencing fast heart rates lightheadedness headache that she associates with going into A-fib.  It has remained over the weekend and she called her cardiologist today who recommended she come to the emergency department for evaluation.  She is on warfarin and states is mostly been compliant with that although recently had a Port-A-Cath placed.  Recently diagnosed with lymphoma and awaiting chemotherapy initiation. she denies any chest pain.  She has baseline shortness of breath and is on oxygen, does not feel any worse than baseline.  No fevers or chills.  The history is provided by the patient.  Atrial Fibrillation This is a recurrent problem. The current episode started more than 2 days ago. The problem occurs constantly. The problem has not changed since onset.Associated symptoms include headaches and shortness of breath. Pertinent negatives include no chest pain and no abdominal pain. Nothing aggravates the symptoms. Nothing relieves the symptoms. She has tried rest for the symptoms. The treatment provided no relief.       Home Medications Prior to Admission medications   Medication Sig Start Date End Date Taking? Authorizing Provider  acetaminophen (TYLENOL) 325 MG tablet Take 2 tablets (650 mg total) by mouth every 6 (six) hours as needed for mild pain (or Fever >/= 101). 06/27/22   Emokpae, Courage, MD   albuterol (VENTOLIN HFA) 108 (90 Base) MCG/ACT inhaler Inhale 2 puffs into the lungs every 4 (four) hours as needed for wheezing or shortness of breath. 06/27/22   Shon Hale, MD  ALPRAZolam Prudy Feeler) 0.5 MG tablet Take 0.5 mg by mouth 3 (three) times daily. 07/01/22   [provider]  aspirin EC 81 MG tablet Take 1 tablet (81 mg total) by mouth daily with breakfast. 06/27/22   Mariea Clonts, Courage, MD  Budeson-Glycopyrrol-Formoterol (BREZTRI AEROSPHERE) 160-9-4.8 MCG/ACT AERO Inhale 2 puffs into the lungs 2 (two) times daily. 06/27/22   Shon Hale, MD  calcium-vitamin D (OSCAL WITH D) 500-5 MG-MCG tablet Take 1 tablet by mouth daily. 11/10/21   Vassie Loll, MD  dofetilide (TIKOSYN) 250 MCG capsule Take 1 capsule (250 mcg total) by mouth 2 (two) times daily. 08/12/22   Graciella Freer, PA-C  feeding supplement (ENSURE ENLIVE / ENSURE PLUS) LIQD Take 237 mLs by mouth 2 (two) times daily between meals. 11/10/21   Vassie Loll, MD  furosemide (LASIX) 40 MG tablet Take 2 tablets (80 mg total) by mouth daily. 06/27/22   Shon Hale, MD  gabapentin (NEURONTIN) 300 MG capsule Take 300-600 mg by mouth See admin instructions. Take 300 mg tablet by mouth in the morning, 300 mg tablet by mouth in the afternoon, and then take two of the 300 mg tablets by mouth for total of 600 mg in the evening per patient    [provider]  HYDROcodone-acetaminophen (NORCO/VICODIN) 5-325 MG tablet Take 2 tablets by mouth 2 (two)  times daily. 10/31/20   [provider]  ipratropium-albuterol (DUONEB) 0.5-2.5 (3) MG/3ML SOLN Inhale 3 mLs into the lungs 2 (two) times daily as needed (shortness of breath). 06/27/22   Shon Hale, MD  magnesium oxide (MAGOX 400) 400 (240 Mg) MG tablet Take 1 tablet (400 mg total) by mouth daily. 02/06/22   Croitoru, Mihai, MD  metoprolol succinate (TOPROL-XL) 50 MG 24 hr tablet Take 1 tablet (50 mg total) by mouth daily. Take with or immediately following  a meal. 06/29/22 06/24/23  Wendall Stade, MD  Multiple Vitamin (MULTI-VITAMINS) TABS Take 1 tablet by mouth daily.     [provider]  pantoprazole (PROTONIX) 40 MG tablet Take 1 tablet (40 mg total) by mouth daily. 06/28/22   Shon Hale, MD  potassium chloride SA (KLOR-CON M) 20 MEQ tablet Take 2 tablets (40 mEq total) by mouth 2 (two) times daily. 07/29/22   Graciella Freer, PA-C  predniSONE (DELTASONE) 20 MG tablet Take 1 tablet (20 mg total) by mouth daily with breakfast. 04/29/22   Wendall Stade, MD  venlafaxine (EFFEXOR) 75 MG tablet Take 75 mg by mouth 2 (two) times daily.    [provider]  warfarin (COUMADIN) 1 MG tablet Take 3 tablets daily except 4 tablets on Fridays or as directed by coumadin clinic Patient taking differently: Take 1-4 mg by mouth See admin instructions. Take 3 tablets every day except on Fridays take four of the 1 mg tablets to make full dose of 4 mg or as directed by coumadin clinic 06/23/22   Wendall Stade, MD  zolpidem (AMBIEN) 10 MG tablet Take 1 tablet (10 mg total) by mouth at bedtime as needed for sleep. Patient taking differently: Take 5 mg by mouth at bedtime as needed for sleep. 06/27/22 07/27/22  Shon Hale, MD      Allergies    Amiodarone and Penicillins    Review of Systems   Review of Systems  Constitutional:  Negative for fever.  Respiratory:  Positive for shortness of breath.   Cardiovascular:  Negative for chest pain.  Gastrointestinal:  Negative for abdominal pain.  Neurological:  Positive for light-headedness and headaches.    Physical Exam Updated Vital Signs BP (!) 131/98 (BP Location: Left Arm)   Pulse (!) 112   Temp 97.9 F (36.6 C) (Oral)   Resp 20   Ht 5\' 4"  (1.626 m)   Wt 47.6 kg   LMP 07/16/2013   SpO2 100%   BMI 18.02 kg/m  Physical Exam Vitals and nursing note reviewed.  Constitutional:      General: She is not in acute distress.    Appearance: Normal appearance. She is  well-developed.  HENT:     Head: Normocephalic and atraumatic.  Eyes:     Conjunctiva/sclera: Conjunctivae normal.  Cardiovascular:     Rate and Rhythm: Tachycardia present. Rhythm irregular.     Heart sounds: No murmur heard. Pulmonary:     Effort: Pulmonary effort is normal. No respiratory distress.     Breath sounds: Normal breath sounds.  Abdominal:     Palpations: Abdomen is soft.     Tenderness: There is no abdominal tenderness. There is no guarding or rebound.  Musculoskeletal:        General: Normal range of motion.     Cervical back: Neck supple.     Right lower leg: No edema.     Left lower leg: No edema.  Skin:    General: Skin is warm  and dry.     Capillary Refill: Capillary refill takes less than 2 seconds.  Neurological:     General: No focal deficit present.     Mental Status: She is alert.     ED Results / Procedures / Treatments   Labs (all labs ordered are listed, but only abnormal results are displayed) Labs Reviewed  BASIC METABOLIC PANEL  MAGNESIUM  CBC  PROTIME-INR    EKG EKG Interpretation  Date/Time:  Monday August 17 2022 14:40:27 EDT Ventricular Rate:  127 PR Interval:    QRS Duration: 74 QT Interval:  308 QTC Calculation: 447 R Axis:   -44 Text Interpretation: Atrial flutter with variable A-V block Left axis deviation Left ventricular hypertrophy with repolarization abnormality ( R in aVL ) Cannot rule out Septal infarct , age undetermined Abnormal ECG When compared with ECG of 17-Jul-2022 10:51, Atrial flutter has replaced Sinus rhythm Vent. rate has increased BY  48 BPM Minimal criteria for Septal infarct are now Present T wave inversion now evident in Anterolateral leads Confirmed by Meridee Score [phone]) on 08/17/2022 2:52:55 PM  Radiology No results found.  Procedures Procedures  {Document cardiac monitor, telemetry assessment procedure when appropriate:1}  Medications Ordered in ED Medications - No data to display  ED  Course/ Medical Decision Making/ A&P   {   Click here for ABCD2, HEART and other calculatorsREFRESH Note before signing :1}                          Medical Decision Making Amount and/or Complexity of Data Reviewed Labs: ordered. Radiology: ordered.   This patient complains of ***; this involves an extensive number of treatment Options and is a complaint that carries with it a high risk of complications and morbidity. The differential includes ***  I ordered, reviewed and interpreted labs, which included *** I ordered medication *** and reviewed PMP when indicated. I ordered imaging studies which included *** and I independently    visualized and interpreted imaging which showed *** Additional history obtained from *** Previous records obtained and reviewed *** I consulted *** and discussed lab and imaging findings and discussed disposition.  Cardiac monitoring reviewed, *** Social determinants considered, *** Critical Interventions: ***  After the interventions stated above, I reevaluated the patient and found *** Admission and further testing considered, ***   {Document critical care time when appropriate:1} {Document review of labs and clinical decision tools ie heart score, Chads2Vasc2 etc:1}  {Document your independent review of radiology images, and any outside records:1} {Document your discussion with family members, caretakers, and with consultants:1} {Document social determinants of health affecting pt's care:1} {Document your decision making why or why not admission, treatments were needed:1} Final Clinical Impression(s) / ED Diagnoses Final diagnoses:  None    Rx / DC Orders ED Discharge Orders     None

## 2022-08-17 NOTE — Telephone Encounter (Signed)
Patient stated she had a port placed on 08/10/22 to receive chemo therapy. Patient stated she is currently in A-Fib and has been since 08/14/22. She did not seek medical attention, patient stated she was diagnosed with A-Fib for years and her symptoms were not bad enough to seek medical attention. Patient does have shortness of breath due to some lung issues but it hasn't worsen, she does have dizziness at times, and HR is 140-160. Patient is scheduled with DOD today at 1530. Will forward to MD and nurse.

## 2022-08-17 NOTE — Progress Notes (Signed)
[facility] [address] [place] [address]    Clinic Day:  08/18/2022  Referring physician: Leone Payor, FNP  Patient Care Team: Leone Payor, FNP as PCP - General (Family Medicine) Wendall Stade, MD as PCP - Cardiology (Cardiology) Benita Stabile, MD as PCP - Internal Medicine (Internal Medicine) Marinus Maw, MD as PCP - Electrophysiology (Cardiology) Pricilla Riffle, MD as Consulting Physician (Cardiology) Doreatha Massed, MD as Medical Oncologist (Medical Oncology) Therese Sarah, RN as Oncology Nurse Navigator (Medical Oncology)   ASSESSMENT & PLAN:   Assessment: 1.  Stage IV follicular lymphoma, grade 1: - CT chest (06/03/2022): Newly enlarged bilateral axillary, subpectoral, low cervical, mediastinal, hilar and most likely upper abdominal lymph nodes.  No evidence of fibrotic interstitial lung disease. - PET scan (06/18/2022): Generalized lymphadenopathy more in the abdomen and retroperitoneum but seen throughout the low neck, chest, abdomen and pelvis, with SUV ranging between 2-4.  Spleen top normal size at 12 cm with FDG uptake slightly less than the liver.  Hepatic steatosis.  Low-level diffuse FDG activity within the marrow spaces throughout the spine and pelvis with no focal area of increased metabolic activity. - No B symptoms.  She is having more night sweats lately every night. 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  Also has paroxysmal AF. - Right groin lymph node biopsy (06/25/2022): Follicular lymphoma, grade 1 - She complains of severe fatigue for the last 6 to 8 months, gradually worsening and interfering with her day-to-day activities. - BMBX (08/10/2022): Hypercellular bone marrow with involvement by follicular lymphoma (70%)   2.  Social/family history: - Lives at home with her husband  and son.  Seen with daughter Irving Burton today.  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  No chemical exposure.  Non-smoker.  Secondhand smoke exposure present. - Maternal grandmother died of leukemia.  Maternal grandfather had lung cancer.  Paternal grandfather had lung cancer.  Paternal great aunt had cancer.  Maternal uncle had breast cancer x 2.  Plan: 1.  Stage IV follicular lymphoma, grade 1: - She was hospitalized and was started on Tikosyn. - Last Friday, she went back into A-fib. - She had port placed and bone marrow biopsy done. - We reviewed bone marrow biopsy results which showed 70% involvement by follicular lymphoma. - She started having night sweats every night. - Once her atrial fibrillation is rate controlled, will consider starting her on Bendamustine and rituximab with Bendamustine dose reduction during cycle 1.  We discussed side effects in detail.  I will see her back 2 weeks after cycle 1.  2.  Worsening shortness of breath: - She was started on prednisone 20 mg daily on 04/30/2022 which is continuing.  Labs today showed leukocytosis, likely from prednisone.  Will closely monitor.  No orders of the defined types were placed in this encounter.    I,Alexis Herring,acting as a Neurosurgeon for [employer], MD.,have documented all relevant documentation on the behalf of Doreatha Massed, MD,as directed by  Doreatha Massed, MD while in the presence of Doreatha Massed, MD.  I, Doreatha Massed MD, have reviewed the above documentation for accuracy and completeness, and I agree with the above.    Doreatha Massed, MD   4/16/20246:22 PM  CHIEF COMPLAINT:   Diagnosis: Grade 1  stage III follicular lymphoma   Cancer Staging  Follicular lymphoma grade I Staging form: Hodgkin and Non-Hodgkin Lymphoma, AJCC 8th Edition - Clinical stage from 07/05/2022: Stage IV (Follicular lymphoma) - Unsigned    Prior Therapy:  None  Current Therapy: Bendamustine and rituximab   HISTORY OF PRESENT ILLNESS:   Oncology History  Follicular lymphoma grade I  07/05/2022 Initial Diagnosis   Follicular lymphoma grade I (HCC)   07/20/2022 -  Chemotherapy   Patient is on Treatment Plan : NON-HODGKINS LYMPHOMA Rituximab D1 + Bendamustine D1,2 q28d x 6 cycles        INTERVAL HISTORY:   Mallory Sutton is a 62 y.o. female presenting to clinic today for follow up of generalized lymph nodes and biopsy results discussion. She was last seen by me on 07/06/22.  She was seen in the ED on 08/17/22 for A-fib with RVR. She had called her cardiology office after she was experiencing A-fib with rates of 140-160bpm with associated intermittent dizziness. Her potassium was 3.4 in the ED and she was treated with potassium chloride at that time. She was started on Toprol XL at time of discharge home.  Today, she states that she is doing well overall. Her appetite level is at 75%. Her energy level is at 50%.   PAST MEDICAL HISTORY:   Past Medical History: Past Medical History:  Diagnosis Date   Aortic stenosis    ATRIAL ARRHYTHMIAS    Bicuspid aortic valve    CHF (congestive heart failure)    COARCTATION OF AORTA    Dysrhythmia    ENDOMETRIOSIS    Heart murmur    Paroxysmal atrial fibrillation    Shortness of breath    THORACIC AORTIC ANEURYSM    Vasculitis     Surgical History: Past Surgical History:  Procedure Laterality Date   CARDIOVERSION N/A 08/29/2019   Procedure: CARDIOVERSION;  Surgeon: Antoine Poche, MD;  Location: [facility];  Service: Endoscopy;  Laterality: N/A;   CARDIOVERSION N/A 07/16/2022   Procedure: CARDIOVERSION;  Surgeon: Sande Rives, MD;  Location: [facility] ENDOSCOPY;  Service: Cardiovascular;  Laterality: N/A;   coarctation repair and residual restenosis     IR BONE MARROW BIOPSY & ASPIRATION  08/10/2022   IR IMAGING GUIDED PORT INSERTION  08/10/2022   KNEE SURGERY     LYMPH NODE BIOPSY Right  06/25/2022   Procedure: LYMPH NODE BIOPSY; right groin;  Surgeon: Lucretia Roers, MD;  Location: [facility];  Service: General;  Laterality: Right;   PORTA CATH INSERTION     TEE WITHOUT CARDIOVERSION N/A 07/12/2019   Procedure: TRANSESOPHAGEAL ECHOCARDIOGRAM (TEE);  Surgeon: Quintella Reichert, MD;  Location: [facility] ENDOSCOPY;  Service: Cardiovascular;  Laterality: N/A;   TEE WITHOUT CARDIOVERSION N/A 08/29/2019   Procedure: TRANSESOPHAGEAL ECHOCARDIOGRAM (TEE) WITH PROPOFOL;  Surgeon: Antoine Poche, MD;  Location: [facility];  Service: Endoscopy;  Laterality: N/A;    Social History: Social History   Socioeconomic History   Marital status: Married    Spouse name: Not on file   Number of children: Not on file   Years of education: Not on file   Highest education level: Not on file  Occupational History   Not on file  Tobacco Use   Smoking status: Never    Passive exposure: Past   Smokeless tobacco: Never   Tobacco comments:    Never smoke 07/14/22  Vaping Use   Vaping Use: Never used  Substance and Sexual Activity   Alcohol use: Yes  Comment: 3 beers 3 nights a week 07/14/22   Drug use: No   Sexual activity: Never  Other Topics Concern   Not on file  Social History Narrative   Not on file   Social Determinants of Health   Financial Resource Strain: High Risk (07/02/2022)   Overall Financial Resource Strain (CARDIA)    Difficulty of Paying Living Expenses: Very hard  Food Insecurity: Food Insecurity Present (07/15/2022)   Hunger Vital Sign    Worried About Running Out of Food in the Last Year: Sometimes true    Ran Out of Food in the Last Year: Sometimes true  Transportation Needs: No Transportation Needs (07/17/2022)   PRAPARE - Administrator, Civil Service (Medical): No    Lack of Transportation (Non-Medical): No  Physical Activity: Not on file  Stress: Not on file  Social Connections: Not on file  Intimate Partner Violence: Not At Risk (07/17/2022)    Humiliation, Afraid, Rape, and Kick questionnaire    Fear of Current or Ex-Partner: No    Emotionally Abused: No    Physically Abused: No    Sexually Abused: No    Family History: Family History  Problem Relation Age of Onset   Suicidality Mother    Mental illness Mother    Drug abuse Brother    Healthy Daughter    Healthy Son     Current Medications:  Current Outpatient Medications:    acetaminophen (TYLENOL) 325 MG tablet, Take 2 tablets (650 mg total) by mouth every 6 (six) hours as needed for mild pain (or Fever >/= 101)., Disp: 100 tablet, Rfl: 1   albuterol (VENTOLIN HFA) 108 (90 Base) MCG/ACT inhaler, Inhale 2 puffs into the lungs every 4 (four) hours as needed for wheezing or shortness of breath., Disp: 54 g, Rfl: 6   ALPRAZolam (XANAX) 0.5 MG tablet, Take 0.5 mg by mouth 3 (three) times daily., Disp: , Rfl:    aspirin EC 81 MG tablet, Take 1 tablet (81 mg total) by mouth daily with breakfast., Disp: 30 tablet, Rfl: 11   Budeson-Glycopyrrol-Formoterol (BREZTRI AEROSPHERE) 160-9-4.8 MCG/ACT AERO, Inhale 2 puffs into the lungs 2 (two) times daily., Disp: 10.7 g, Rfl: 1   calcium-vitamin D (OSCAL WITH D) 500-5 MG-MCG tablet, Take 1 tablet by mouth daily., Disp: 30 tablet, Rfl: 2   dofetilide (TIKOSYN) 250 MCG capsule, Take 1 capsule (250 mcg total) by mouth 2 (two) times daily., Disp: 60 capsule, Rfl: 6   feeding supplement (ENSURE ENLIVE / ENSURE PLUS) LIQD, Take 237 mLs by mouth 2 (two) times daily between meals., Disp: , Rfl:    furosemide (LASIX) 40 MG tablet, Take 2 tablets (80 mg total) by mouth daily., Disp: 30 tablet, Rfl: 3   gabapentin (NEURONTIN) 300 MG capsule, Take 300-600 mg by mouth See admin instructions. Take 300 mg tablet by mouth in the morning, 300 mg tablet by mouth in the afternoon, and then take two of the 300 mg tablets by mouth for total of 600 mg in the evening per patient, Disp: , Rfl:    HYDROcodone-acetaminophen (NORCO/VICODIN) 5-325 MG tablet, Take 2  tablets by mouth 2 (two) times daily., Disp: , Rfl:    ipratropium-albuterol (DUONEB) 0.5-2.5 (3) MG/3ML SOLN, Inhale 3 mLs into the lungs 2 (two) times daily as needed (shortness of breath)., Disp: 360 mL, Rfl: 1   magnesium oxide (MAGOX 400) 400 (240 Mg) MG tablet, Take 1 tablet (400 mg total) by mouth daily., Disp: 30 tablet, Rfl: 2  metoprolol succinate (TOPROL-XL) 25 MG 24 hr tablet, Take 1 tablet (25 mg total) by mouth daily., Disp: 30 tablet, Rfl: 0   metoprolol succinate (TOPROL-XL) 50 MG 24 hr tablet, Take 1 tablet (50 mg total) by mouth daily. Take with or immediately following a meal., Disp: 30 tablet, Rfl: 11   Multiple Vitamin (MULTI-VITAMINS) TABS, Take 1 tablet by mouth daily. , Disp: , Rfl:    pantoprazole (PROTONIX) 40 MG tablet, Take 1 tablet (40 mg total) by mouth daily., Disp: 30 tablet, Rfl: 2   potassium chloride SA (KLOR-CON M) 20 MEQ tablet, Take 2 tablets (40 mEq total) by mouth 2 (two) times daily., Disp: 120 tablet, Rfl: 3   predniSONE (DELTASONE) 20 MG tablet, Take 1 tablet (20 mg total) by mouth daily with breakfast., Disp: 90 tablet, Rfl: 3   venlafaxine (EFFEXOR) 75 MG tablet, Take 75 mg by mouth 2 (two) times daily., Disp: , Rfl:    warfarin (COUMADIN) 1 MG tablet, Take 3 tablets daily except 4 tablets on Fridays or as directed by coumadin clinic (Patient taking differently: Take 1-4 mg by mouth See admin instructions. Take 3 tablets every day except on Fridays take four of the 1 mg tablets to make full dose of 4 mg or as directed by coumadin clinic), Disp: 100 tablet, Rfl: 5   zolpidem (AMBIEN) 10 MG tablet, Take 1 tablet (10 mg total) by mouth at bedtime as needed for sleep. (Patient taking differently: Take 5 mg by mouth at bedtime as needed for sleep.), Disp: 10 tablet, Rfl: 0   Allergies: Allergies  Allergen Reactions   Amiodarone Nausea Only   Penicillins Hives    Has patient had a PCN reaction causing immediate rash, facial/tongue/throat swelling, SOB or  lightheadedness with hypotension: YES Has patient had a PCN reaction causing severe rash involving mucus membranes or skin necrosis: NO Has patient had a PCN reaction that required hospitalization NO Has patient had a PCN reaction occurring within the last 10 years: NO If all of the above answers are "NO", then may proceed with Cephalosporin use.     REVIEW OF SYSTEMS:   Review of Systems  Constitutional:  Negative for chills, fatigue and fever.  HENT:   Negative for lump/mass, mouth sores, nosebleeds, sore throat and trouble swallowing.   Eyes:  Negative for eye problems.  Respiratory:  Positive for shortness of breath. Negative for cough.   Cardiovascular:  Positive for palpitations. Negative for chest pain and leg swelling.  Gastrointestinal:  Negative for abdominal pain, constipation, diarrhea, nausea and vomiting.  Genitourinary:  Negative for bladder incontinence, difficulty urinating, dysuria, frequency, hematuria and nocturia.   Musculoskeletal:  Negative for arthralgias, back pain, flank pain, myalgias and neck pain.  Skin:  Negative for itching and rash.  Neurological:  Negative for dizziness, headaches and numbness.  Hematological:  Does not bruise/bleed easily.  Psychiatric/Behavioral:  Negative for depression, sleep disturbance and suicidal ideas. The patient is not nervous/anxious.   All other systems reviewed and are negative.    VITALS:   Blood pressure 132/87, pulse (!) 118, temperature (!) 97.5 F (36.4 C), temperature source Tympanic, resp. rate 20, weight 109 lb 8 oz (49.7 kg), last menstrual period 07/16/2013, SpO2 100 %.  Wt Readings from Last 3 Encounters:  08/18/22 109 lb 8 oz (49.7 kg)  08/17/22 105 lb (47.6 kg)  07/24/22 110 lb (49.9 kg)    Body mass index is 18.8 kg/m.  Performance status (ECOG): 1 - Symptomatic but completely ambulatory  PHYSICAL EXAM:   Physical Exam Vitals and nursing note reviewed. Exam conducted with a chaperone present.   Constitutional:      Appearance: Normal appearance.  Cardiovascular:     Rate and Rhythm: Normal rate and regular rhythm.     Pulses: Normal pulses.     Heart sounds: Normal heart sounds.  Pulmonary:     Effort: Pulmonary effort is normal.     Breath sounds: Normal breath sounds.  Abdominal:     Palpations: Abdomen is soft. There is no hepatomegaly, splenomegaly or mass.     Tenderness: There is no abdominal tenderness.  Musculoskeletal:     Right lower leg: No edema.     Left lower leg: No edema.  Lymphadenopathy:     Cervical: No cervical adenopathy.     Right cervical: No superficial, deep or posterior cervical adenopathy.    Left cervical: No superficial, deep or posterior cervical adenopathy.     Upper Body:     Right upper body: No supraclavicular or axillary adenopathy.     Left upper body: No supraclavicular or axillary adenopathy.  Neurological:     General: No focal deficit present.     Mental Status: She is alert and oriented to person, place, and time.  Psychiatric:        Mood and Affect: Mood normal.        Behavior: Behavior normal.     LABS:      Latest Ref Rng & Units 08/17/2022    3:30 PM 08/10/2022    6:45 AM 07/17/2022    2:24 AM  CBC  WBC 4.0 - 10.5 K/uL 25.0  16.6  13.4   Hemoglobin 12.0 - 15.0 g/dL 16.1  09.6  7.9   Hematocrit 36.0 - 46.0 % 36.5  32.9  25.0   Platelets 150 - 400 K/uL 448  385  319       Latest Ref Rng & Units 08/17/2022    3:30 PM 07/28/2022    3:22 PM 07/24/2022    1:03 PM  CMP  Glucose 70 - 99 mg/dL 045  90  75   BUN 6 - 20 mg/dL Creatinine 0.44 - 1.00 mg/dL 4.09  8.11  9.14   Sodium 135 - 145 mmol/L 137  139  143   Potassium 3.5 - 5.1 mmol/L 3.4  4.4  3.1   Chloride 98 - 111 mmol/L 99  102  98   CO2 22 - 32 mmol/L Calcium 8.9 - 10.3 mg/dL 8.6  8.6  9.0      No results found for: "CEA1", "CEA" / No results found for: "CEA1", "CEA" No results found for: "PSA1" No results found for:  "NWG956" No results found for: "CAN125"  No results found for: "TOTALPROTELP", "ALBUMINELP", "A1GS", "A2GS", "BETS", "BETA2SER", "GAMS", "MSPIKE", "SPEI" No results found for: "TIBC", "FERRITIN", "IRONPCTSAT" Lab Results  Component Value Date   LDH 200 (H) 06/23/2022   06/23/22    Beta-2 Microglobulin 0.6 - 2.4 mg/L 2.7    Pathology 06/25/22: FINAL MICROSCOPIC DIAGNOSIS:  A. LYMPH NODE, RIGHT GROIN, EXCISION: -  Follicular lymphoma, low-grade  Note: The normal lymph node architecture is effaced by a nodular pattern of predominantly small irregular cleaved type/centrocyte type lymphocytes.  Only a rare centroblasts is present.  The capsule is also effaced with loss of most of the capsular sinuses and extension into the perinodal fat by these small  lymphocytes.  These lymphocytes are almost exclusively CD20 positive B cells that coexpress CD10 as well as prominent subset BCL6 consistent with a germinal center origin.  They aberrantly coexpress Bcl-2.  CD23 highlights an expanded abnormal follicular dendritic cell meshwork.  The proliferation rate by Ki-67 is overall low, <5%.  Flow cytometry identifies 85% CD10 positive kappa restricted B cells.  The above findings are consistent with a follicular lymphoma, low-grade (grade 1 of 3) with a near 100% follicular pattern.  GROSS DESCRIPTION:  Received fresh is a 2.1 cm soft well-defined nodule with homogeneous pink-tan cut surfaces.  Touch preparations are made on 1 slide, portions of tissue are submitted in RPMI for possible flow cytometry, and remaining specimen is sectioned and entirely submitted in 2 blocks for routine histology.    STUDIES:   DG Chest 2 View  Result Date: 08/17/2022 CLINICAL DATA:  Shortness of breath.  Chest pain. EXAM: CHEST - 2 VIEW COMPARISON:  Chest radiographs 06/23/2022 and 02/03/2022 FINDINGS: New right chest wall porta catheter tip overlies the central superior vena cava. Status post median  sternotomy and aortic valve prosthesis. Left atrial appendage clip again noted. Cardiac silhouette and mediastinal contours are within normal limits. The lungs are clear. No pleural effusion or pneumothorax. Moderate-to-severe approximate L2 vertebral body height loss is unchanged. IMPRESSION: 1. No active cardiopulmonary disease. 2. New right chest wall porta catheter tip overlies the central superior vena cava. Electronically Signed   By: Neita Garnet M.D.   On: 08/17/2022 15:31   IR IMAGING GUIDED PORT INSERTION  Result Date: 08/10/2022 INDICATION: 62 year old female with new diagnosis of follicular cell lymphoma. She presents for port catheter placement to establish durable venous access. EXAM: IMPLANTED PORT A CATH PLACEMENT WITH ULTRASOUND AND FLUOROSCOPIC GUIDANCE MEDICATIONS: None. ANESTHESIA/SEDATION: Versed 5 mg IV; Fentanyl 200 mcg IV; Moderate Sedation Time:  48 minutes The patient's vital signs and level of consciousness were continuously monitored during the procedure by the interventional radiology nurse under my direct supervision. FLUOROSCOPY: Radiation exposure index: 1 mGy reference air kerma COMPLICATIONS: None immediate. PROCEDURE: The right neck and chest was prepped with chlorhexidine, and draped in the usual sterile fashion using maximum barrier technique (cap and mask, sterile gown, sterile gloves, large sterile sheet, hand hygiene and cutaneous antiseptic). Local anesthesia was attained by infiltration with 1% lidocaine with epinephrine. Ultrasound demonstrated patency of the right internal jugular vein, and this was documented with an image. Under real-time ultrasound guidance, this vein was accessed with a 21 gauge micropuncture needle and image documentation was performed. A small dermatotomy was made at the access site with an 11 scalpel. A 0.018" wire was advanced into the SVC and the access needle exchanged for a 75F micropuncture vascular sheath. The 0.018" wire was then removed  and a 0.035" wire advanced into the IVC. An appropriate location for the subcutaneous reservoir was selected below the clavicle and an incision was made through the skin and underlying soft tissues. The subcutaneous tissues were then dissected using a combination of blunt and sharp surgical technique and a pocket was formed. A single lumen power injectable portacatheter was then tunneled through the subcutaneous tissues from the pocket to the dermatotomy and the port reservoir placed within the subcutaneous pocket. The venous access site was then serially dilated and a peel away vascular sheath placed over the wire. The wire was removed and the port catheter advanced into position under fluoroscopic guidance. The catheter tip is positioned in the superior cavoatrial junction. This was documented  with a spot image. The portacatheter was then tested and found to flush and aspirate well. The port was flushed with saline followed by 100 units/mL heparinized saline. The pocket was then closed in two layers using first subdermal inverted interrupted absorbable sutures followed by a running subcuticular suture. The epidermis was then sealed with Dermabond. The dermatotomy at the venous access site was also closed with Dermabond. IMPRESSION: Successful placement of a right IJ approach Power Port with ultrasound and fluoroscopic guidance. The catheter is ready for use. Electronically Signed   By: Malachy Moan M.D.   On: 08/10/2022 10:13   IR BONE MARROW BIOPSY & ASPIRATION  Result Date: 08/10/2022 INDICATION: New diagnosis of follicular lymphoma. Bone marrow biopsy is requested to facilitate staging. EXAM: FLUORO GUIDED BONE MARROW ASPIRATION AND CORE BIOPSY Interventional Radiologist:  Sterling Big, MD MEDICATIONS: None. ANESTHESIA/SEDATION: See port catheter report. FLUOROSCOPY: Radiation exposure index: 69 mGy reference air kerma COMPLICATIONS: None immediate. Estimated blood loss: <25 mL PROCEDURE:  Informed written consent was obtained from the patient after a thorough discussion of the procedural risks, benefits and alternatives. All questions were addressed. Maximal Sterile Barrier Technique was utilized including caps, mask, sterile gowns, sterile gloves, sterile drape, hand hygiene and skin antiseptic. A timeout was performed prior to the initiation of the procedure. The patient was positioned prone and fluoroscopy was performed of the pelvis to demonstrate the iliac marrow spaces. Additionally, cone beam CT imaging was performed with independent reconstruction manipulation on the 3D workstation during the course of the procedure. Maximal barrier sterile technique utilized including caps, mask, sterile gowns, sterile gloves, large sterile drape, hand hygiene, and betadine prep. Under sterile conditions and local anesthesia, an 11 gauge coaxial bone biopsy needle was advanced into the left iliac marrow space. Needle position was confirmed with fluoroscopy and cone beam CT imaging. Initially, bone marrow aspiration was performed. Next, the 11 gauge outer cannula was utilized to obtain a left iliac bone marrow core biopsy. Needle was removed. Hemostasis was obtained with compression. The patient tolerated the procedure well. Samples were prepared with the cytotechnologist. IMPRESSION: Technically successful image guided left iliac bone marrow aspiration and core biopsy. Electronically Signed   By: Malachy Moan M.D.   On: 08/10/2022 10:13

## 2022-08-17 NOTE — Telephone Encounter (Signed)
Was advised by MD patient should go to the nearest emergency room due to current symptoms. Patient stated she does not know how will they treat my symptoms. Explained the MD recommendations, patient replied I have an appointment with my oncologist tomorrow. Reiterated MD recommendations, patient voiced understanding. Will forward to MD and nurse.

## 2022-08-17 NOTE — Telephone Encounter (Signed)
Patient c/o Palpitations:  High priority if patient c/o lightheadedness, shortness of breath, or chest pain  How long have you had palpitations/irregular HR/ Afib? Are you having the symptoms now? Yes Since Friday night at 8:30  Are you currently experiencing lightheadedness, SOB or CP? SOB   Do you have a history of afib (atrial fibrillation) or irregular heart rhythm? Yes  Have you checked your BP or HR? (document readings if available): HR 150 or 160 since Friday night   Are you experiencing any other symptoms? Pt has cancer and they put a port in Last Monday,  she also had a bone morrow biopsy.    Best number [phone]

## 2022-08-17 NOTE — Discharge Instructions (Addendum)
You were seen in the emergency department for atrial fibrillation with elevated heart rate.  Unfortunately your warfarin level was subtherapeutic at 1.7 and we are unable to cardiovert you.  Cardiology is recommending increasing your metoprolol to 75 so we are giving you a new prescription for 25 to add to the 50 you already take.  Please follow-up with your cardiologist on Friday as scheduled.  Continue your regular medications and increase your warfarin as directed by your team.  Return to the emergency department if any worsening or concerning symptoms

## 2022-08-17 NOTE — ED Triage Notes (Signed)
Pt had a medication change for her Afib and went back into Afib on Friday. Pt with SOB and CP.  Cough for a week. + lightheadedness off and on.

## 2022-08-18 ENCOUNTER — Encounter: Payer: Self-pay | Admitting: Hematology

## 2022-08-18 ENCOUNTER — Inpatient Hospital Stay: Payer: Medicaid Other | Attending: Hematology | Admitting: Hematology

## 2022-08-18 VITALS — BP 132/87 | HR 118 | Temp 97.5°F | Resp 20 | Wt 109.5 lb

## 2022-08-18 DIAGNOSIS — C8208 Follicular lymphoma grade I, lymph nodes of multiple sites: Secondary | ICD-10-CM | POA: Diagnosis not present

## 2022-08-18 DIAGNOSIS — Z95828 Presence of other vascular implants and grafts: Secondary | ICD-10-CM

## 2022-08-18 DIAGNOSIS — C8200 Follicular lymphoma grade I, unspecified site: Secondary | ICD-10-CM | POA: Diagnosis present

## 2022-08-18 NOTE — Patient Instructions (Addendum)
[facility] at [facility] Discharge Instructions   You were seen and examined today by Dr. Ellin Saba.  He reviewed the results of your bone marrow biopsy which shows 70% of your bone marrow is involved by lymphoma.   We will hold off on starting treatment until your a-fib is under better control.   Please call Revonda Standard after your appointment with your cardiologist on Friday to let her know what the plan is for treating your a-fib so we can plan your treatment schedule accordingly.      Thank you for choosing [facility] at [facility] to provide your oncology and hematology care.  To afford each patient quality time with our provider, please arrive at least 15 minutes before your scheduled appointment time.   If you have a lab appointment with the Cancer Center please come in thru the Main Entrance and check in at the main information desk.  You need to re-schedule your appointment should you arrive 10 or more minutes late.  We strive to give you quality time with our providers, and arriving late affects you and other patients whose appointments are after yours.  Also, if you no show three or more times for appointments you may be dismissed from the clinic at the providers discretion.     Again, thank you for choosing [facility].  Our hope is that these requests will decrease the amount of time that you wait before being seen by our physicians.       _____________________________________________________________  Should you have questions after your visit to [facility], please contact our office at [phone] and follow the prompts.  Our office hours are 8:00 a.m. and 4:30 p.m. Monday - Friday.  Please note that voicemails left after 4:00 p.m. may not be returned until the following business day.  We are closed weekends and major holidays.  You do have access to a nurse 24-7, just call the main number to the clinic  [phone] and do not press any options, hold on the line and a nurse will answer the phone.    For prescription refill requests, have your pharmacy contact our office and allow 72 hours.    Due to Covid, you will need to wear a mask upon entering the hospital. If you do not have a mask, a mask will be given to you at the Main Entrance upon arrival. For doctor visits, patients may have 1 support person age 63 or older with them. For treatment visits, patients can not have anyone with them due to social distancing guidelines and our immunocompromised population.

## 2022-08-21 ENCOUNTER — Encounter: Payer: Self-pay | Admitting: Student

## 2022-08-21 ENCOUNTER — Encounter (HOSPITAL_COMMUNITY): Payer: Self-pay | Admitting: Hematology

## 2022-08-21 ENCOUNTER — Ambulatory Visit: Payer: Medicaid Other | Attending: Student | Admitting: Student

## 2022-08-21 VITALS — BP 120/70 | HR 62 | Ht 64.0 in | Wt 108.0 lb

## 2022-08-21 DIAGNOSIS — Z952 Presence of prosthetic heart valve: Secondary | ICD-10-CM

## 2022-08-21 DIAGNOSIS — I272 Pulmonary hypertension, unspecified: Secondary | ICD-10-CM

## 2022-08-21 DIAGNOSIS — I4819 Other persistent atrial fibrillation: Secondary | ICD-10-CM

## 2022-08-21 DIAGNOSIS — I4892 Unspecified atrial flutter: Secondary | ICD-10-CM | POA: Diagnosis not present

## 2022-08-21 DIAGNOSIS — Q231 Congenital insufficiency of aortic valve: Secondary | ICD-10-CM

## 2022-08-21 NOTE — Progress Notes (Signed)
  Electrophysiology Office Note:   Date:  08/21/2022  ID:  Mallory Sutton, DOB April 12, 1961, MRN [id number]  Primary Cardiologist: Charlton Haws, MD Electrophysiologist: Lewayne Bunting, MD   History of Present Illness:   Mallory Sutton is a 62 y.o. female with h/o coarctation repair at age 82, bicuspid AV s/p AVR 2016 with MAZE and LAA occlusion, chronic diastolic CHF, COPD, lymphoma, atrial fibrillation  seen today for routine electrophysiology followup.   Admitted 3/12 - 3/15 for tikosyn load. Required [facility] but otherwise had unremarkable course   Underwent bone marrow biopsy and now has port with plans for chemotherapy.  Since last being seen in our clinic the patient reports doing well overall. She has had some more AF, but rates have been overall better controlled. She was seen in ED 4/15 and was in AF 120s, but with low INR she was discharge. Was still in AF at oncology appt 4/16.  SOB chronic and stable. Has not had any nausea, but is worried that she may after starting chemo. She understands medicine options are limited.  Review of systems complete and found to be negative unless listed in HPI.   Studies Reviewed:    EKG is ordered today. Personal review shows NSr at 62 bpm with PACs   Risk Assessment/Calculations:            Physical Exam:   VS:  BP 120/70   Pulse 62   Ht  (1.626 m)   Wt 108 lb (49 kg)   LMP 07/16/2013   SpO2 99%   BMI 18.54 kg/m    Wt Readings from Last 3 Encounters:  08/21/22 108 lb (49 kg)  08/18/22 109 lb 8 oz (49.7 kg)  08/17/22 105 lb (47.6 kg)     GEN: Well nourished, well developed in no acute distress NECK: No JVD; No carotid bruits CARDIAC: Regular rate and rhythm, no murmurs, rubs, gallops RESPIRATORY:  Clear to auscultation without rales, wheezing or rhonchi  ABDOMEN: Soft, non-tender, non-distended EXTREMITIES:  No edema; No deformity   ASSESSMENT AND PLAN:    Persistent atrial fibrillation EKG today shows NSR with stable QT  Continue  tikosyn 250 mcg BID Labs today.  She has continued to have some breakthrough AF. Rate control also seems better when she IS in AF on toprol, though rarely with rates up into 150-160s. Can increase as needed/tolerated.   Bicuspid AV Stable prior echo Continue coumadin. INR 1.7 4/15.    HTN Stable on current regimen    Chronic diastolic CHF Volume status stable on exam.   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 Ellin Saba Bendamustine and rituximab with Bendamustine dose reduction during cycle 1.    COPD Continue home O2  Anti-emetics She is worried about Nausea from chemo with her limited options being on tikosyn. Will work with pharm and Oncology to figure out best option for her.   Follow up with Dr. Ladona Ridgel in 3 months  Signed, Graciella Freer, PA-C

## 2022-08-21 NOTE — Patient Instructions (Signed)
Medication Instructions:  Your physician recommends that you continue on your current medications as directed. Please refer to the Current Medication list given to you today.  *If you need a refill on your cardiac medications before your next appointment, please call your pharmacy*  Lab Work: BMET, MAG--TODAY If you have labs (blood work) drawn today and your tests are completely normal, you will receive your results only by: MyChart Message (if you have MyChart) OR A paper copy in the mail If you have any lab test that is abnormal or we need to change your treatment, we will call you to review the results.  Follow-Up: At [facility], you and your health needs are our priority.  As part of our continuing mission to provide you with exceptional heart care, we have created designated Provider Care Teams.  These Care Teams include your primary Cardiologist (physician) and Advanced Practice Providers (APPs -  Physician Assistants and Nurse Practitioners) who all work together to provide you with the care you need, when you need it.  Your next appointment:   3 month(s)  Provider:   Lewayne Bunting, MD

## 2022-08-22 ENCOUNTER — Other Ambulatory Visit: Payer: Self-pay

## 2022-08-22 ENCOUNTER — Encounter: Payer: Self-pay | Admitting: Hematology

## 2022-08-22 LAB — BASIC METABOLIC PANEL
BUN/Creatinine Ratio: 20 (ref 12–28)
BUN: 16 mg/dL (ref 8–27)
CO2: 26 mmol/L (ref 20–29)
Calcium: 9.4 mg/dL (ref 8.7–10.3)
Chloride: 101 mmol/L (ref 96–106)
Creatinine, Ser: 0.81 mg/dL (ref 0.57–1.00)
Glucose: 71 mg/dL (ref 70–99)
Potassium: 4.1 mmol/L (ref 3.5–5.2)
Sodium: 142 mmol/L (ref 134–144)
eGFR: 83 mL/min/{1.73_m2} (ref >59)

## 2022-08-22 LAB — MAGNESIUM: Magnesium: 2.6 mg/dL — ABNORMAL HIGH (ref 1.6–2.3)

## 2022-08-24 ENCOUNTER — Ambulatory Visit: Payer: Medicaid Other | Attending: Cardiovascular Disease | Admitting: *Deleted

## 2022-08-24 ENCOUNTER — Encounter: Payer: Self-pay | Admitting: Cardiovascular Disease

## 2022-08-24 ENCOUNTER — Ambulatory Visit: Payer: Medicaid Other | Attending: Cardiovascular Disease | Admitting: Cardiovascular Disease

## 2022-08-24 VITALS — BP 148/74 | HR 58 | Ht 64.0 in | Wt 111.2 lb

## 2022-08-24 DIAGNOSIS — Q231 Congenital insufficiency of aortic valve: Secondary | ICD-10-CM | POA: Diagnosis not present

## 2022-08-24 DIAGNOSIS — I4892 Unspecified atrial flutter: Secondary | ICD-10-CM | POA: Diagnosis not present

## 2022-08-24 DIAGNOSIS — Z5181 Encounter for therapeutic drug level monitoring: Secondary | ICD-10-CM

## 2022-08-24 DIAGNOSIS — Z952 Presence of prosthetic heart valve: Secondary | ICD-10-CM

## 2022-08-24 DIAGNOSIS — I4891 Unspecified atrial fibrillation: Secondary | ICD-10-CM | POA: Diagnosis not present

## 2022-08-24 DIAGNOSIS — Q251 Coarctation of aorta: Secondary | ICD-10-CM | POA: Diagnosis not present

## 2022-08-24 DIAGNOSIS — Z7901 Long term (current) use of anticoagulants: Secondary | ICD-10-CM

## 2022-08-24 LAB — POCT INR: INR: 1.6 — AB (ref 2.0–3.0)

## 2022-08-24 NOTE — Patient Instructions (Signed)
Medication Instructions:  Your physician recommends that you continue on your current medications as directed. Please refer to the Current Medication list given to you today.  *If you need a refill on your cardiac medications before your next appointment, please call your pharmacy*   Lab Work: NONE   If you have labs (blood work) drawn today and your tests are completely normal, you will receive your results only by: MyChart Message (if you have MyChart) OR A paper copy in the mail If you have any lab test that is abnormal or we need to change your treatment, we will call you to review the results.   Testing/Procedures: NONE    Follow-Up: At [facility], you and your health needs are our priority.  As part of our continuing mission to provide you with exceptional heart care, we have created designated Provider Care Teams.  These Care Teams include your primary Cardiologist (physician) and Advanced Practice Providers (APPs -  Physician Assistants and Nurse Practitioners) who all work together to provide you with the care you need, when you need it.  We recommend signing up for the patient portal called "MyChart".  Sign up information is provided on this After Visit Summary.  MyChart is used to connect with patients for Virtual Visits (Telemedicine).  Patients are able to view lab/test results, encounter notes, upcoming appointments, etc.  Non-urgent messages can be sent to your provider as well.   To learn more about what you can do with MyChart, go to [email or web address].    Your next appointment:   6 month(s)  Provider:   You may see Peter Nishan, MD or one of the following Advanced Practice Providers on your designated Care Team:   Brittany Strader, PA-C  Michele Lenze, PA-C     Other Instructions Thank you for choosing  [facility]!    

## 2022-08-24 NOTE — Patient Instructions (Signed)
Increase warfarin to 4 tablets daily. Recheck INR in 1 wk

## 2022-08-27 NOTE — Patient Instructions (Addendum)
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  We will treat you in the clinic every 4 weeks with two different drugs. Those drugs are rituximab (Rituxan) and bendamustine.  You will come two days in a row every 4 weeks. On the first day you will receive both drugs.  On the second day, you will receive the bendamustine only. We will do this for a total of 6 cycles. The intent of treatment is to control this cancer, shrink it and prevent it from spreading further, and to alleviate any symptoms you may be having related to this lymphoma. You will see the doctor regularly throughout treatment.  We will obtain blood work from you prior to every treatment and monitor your results to make sure it is safe to give your treatment. The doctor monitors your response to treatment by the way you are feeling, your blood work, and by obtaining scans periodically. There will be wait times while you are here for treatment.  It will take about 30 minutes to 1 hour for your lab work to result. Then there will be wait times while pharmacy mixes your medications.     Medications you will receive in the clinic prior to your chemotherapy medications:  Aloxi:  ALOXI is used in adults to help prevent nausea and vomiting that happens with certain chemotherapy drugs.  Aloxi is a long acting medication, and will remain in your system for about two days.   Dexamethasone:  This is a steroid given prior to chemotherapy to help prevent allergic reactions; it may also help prevent and control nausea and diarrhea.   Tylenol:  Given to prevent infusion reactions to rituximab.  Benadryl:  Antihistamine given to prevent allergic/infusion reactions to rituximab.     Rituximab (Generic Name) Other Name: Rituxan  About This Drug  Rituximab is a monoclonal antibody used to treat cancer. This drug is given in the vein (IV).  This first time this is given it will be infused  slower to monitor for infusion reactions.    Possible Side Effects (More Common)   Bone marrow depression. This is a decrease in the number of white blood cells, red blood cells, and platelets. This  may raise your risk of infection, make you tired and weak (fatigue), and raise your risk of bleeding.   Rash-skin irritation, redness or itching (dermatitis)   Flu-like symptoms: fever, headache, muscle and joint aches, and fatigue (low energy, feeling weak)   Infusion-related reactions   Hepatitis B - if you have ever had hepatitis B, the virus may come back during treatment with this drug. Your doctor will test to see if you have ever had hepatitis B prior to your treatment.   Changes in your central nervous system can happen. The central nervous system is made up of your brain and spinal cord. You could feel: extreme tiredness, agitation, confusion, or have: hallucinations (see or hear things that are not there), trouble understanding or speaking, loss of control of your bowels or bladder, eyesight changes, numbness or lack of strength to your arms, legs, face, or body, seizures or coma. If you start to have any of these symptoms let your doctor know right away.   Tumor lysis: This drug may act on the cancer cells very quickly. This may affect how your kidneys work. Your doctor will monitor your kidney function.   Changes in your liver function. Your doctor will check your liver function as needed.  Nausea and throwing up (vomiting): these symptoms may happen within a few hours after your treatment and may last up to 24 hours. Medicines are available to stop or lessen these side effects.   Loose bowel movements (diarrhea) that may last for a few days   Abdominal pain   Infections   Cough, runny nose   Swelling of your legs, ankles and/or feet or hands   High blood pressure. Your doctor will check your blood pressure as needed.   Abnormal heart beat  Possible Side Effects (Less  Common)   Shortness of breath   Soreness of the mouth and throat. You may have red areas, white patches, or sores that hurt.  Infusion Reactions  Infusion Reactions are the most common side effect linked to use of this drug and can be quite severe. Medicines will be given before you get the drug to lower the severity of this side effect. The infusion reactions are the worse with the first dose of the drug and become less severe with more doses of the drug. While you are getting this drug in your vein (IV), tell your nurse right away if you have any of these symptoms of an allergic reaction:   Trouble catching your breath   Feeling like your tongue or throat are swelling   Feeling your heart beat quickly or in a not normal way (palpitations)   Feeling dizzy or lightheaded   Flushing, itching, rash, and/or hives   Treating Side Effects   Ask your doctor or nurse about medicine to stop or lessen headache, loose bowel movements (diarrhea), constipation, nausea, throwing up (vomiting), or pain.   If you get a rash do not put anything it unless your doctor or nurse says you may. Keep the area around the rash clean and dry. Ask your doctor for medicine if the rash bothers you.   Drink 6-8 cups of fluids each day unless your doctor has told you to limit your fluid intake due to some other health problem. A cup is 8 ounces of fluid. If you throw up or have loose bowel movements, you should drink more fluids so that you do not become dehydrated (lack of water in the body from losing too much fluid).   If you are not able to move your bowels, check with your doctor or nurse before you use enemas, laxatives, or suppositories   If you have mouth sores, avoid mouthwash that has alcohol. Also avoid alcohol and smoking because they can bother your mouth and throat.   If you have a nose bleed, sit with your head tipped slightly forward. Apply pressure by lightly pinching the bridge of your nose  between your thumb and forefinger. Call your doctor if you feel dizzy or faint or if the bleeding doesn't stop after 10 to 15 minutes   Important Information   After treatment with this drug, vaccination with live viruses should be delayed until the immune system recovers.   Symptoms of abnormal bleeding may be: coughing up blood, throwing up blood (may look like coffee grounds), red or black, tarry bowel movements, blood in urine, abnormally heavy menstrual flow, nosebleeds, or any unusual bleeding.   Symptoms of high blood pressure may be: headache, blurred vision, confusion, chest pain, or a feeling that your heart is beat differently.   Urinary tract infection. Symptoms may include:   Pain or burning when you pass urine   Feeling like you have to pass urine often, but not much comes  out when you do.   Tender or heavy feeling in your lower abdomen   Cloudy urine and/or urine that smells bad.   Pain on one side of your back under your ribs. This is where your kidneys are.   Fever, chills, nausea and/or throwing up   Food and Drug Interactions  There are no known interactions of rituximab and any food. This drug may interact with other medicines. Tell your doctor and pharmacist about all the medicines and dietary supplements (vitamins, minerals, herbs and others) that you are taking at this time. The safety and use of dietary supplements and alternative diets are often not known. Using these might affect your cancer or interfere with your treatment. Until more is known, you should not use dietary supplements or alternative diets without your cancer doctor's help.   When to Call the Doctor  Call your doctor or nurse right away if you have any of these symptoms:   Fever of 100.4 F (38 C) higher   Chills   Trouble breathing   Rash with or without itching   Blistering or peeling of skin   Chest pain or symptoms of a heart attack. Most heart attacks involve pain in the center  of the chest that lasts more than a few minutes. The pain may go away and come back or it can be constant. It can feel like pressure, squeezing, fullness, or pain. Sometimes pain is felt in one or both arms, the back, neck, jaw, or stomach. If any of these symptoms last 2 minutes, call 911   Easy bleeding or bruising   Blood in urine or bowel movements   Feeling that your heart is beating in a fast or not normal way (palpitations)   Nausea that stops you from eating or drinking   Throwing up/vomiting   Abdominal pain   Loose bowel movements (diarrhea) 4 times in one day or diarrhea with weakness or lightheadedness   No bowel movement in 3 days or if you feel uncomfortable   Feeling dizzy or lightheaded   Changes in your speech or vision   Feeling confused   Weakness of your arms and legs or poor coordination (feeling clumsy)   Signs of liver problems: dark urine, pale bowel movements, bad stomach pain, feeling very tired and weak, unusual itching, or yellowing of skin or eyes   Symptoms of a urinary tract infection (see important information)   Call your doctor or nurse as soon as possible if you have any of these symptoms:   Swelling of your legs, ankles and/or feet   Fatigue and /or weakness that interferes with your daily activities   Joint and muscle pain or muscle spasms that are not relieved by prescribed medicines   Cough that lasts longer than normal   Reproduction Concerns   Pregnancy warning: This drug is known to cross the placenta. This drug may have harmful effects on an unborn baby. Effective methods of birth control should be used during treatment with this drug and for 12 months after the last treatment. If exposure occurs to an unborn baby, the baby's immune system may be affected, which could last for months after birth. Until the immune system recovers, live vaccines should not be administered to the baby. Be sure to talk with your doctor if you are  pregnant or planning to become pregnant while getting this drug.   Breast feeding warning: It is not known if rituximab is passed into human breast milk. In animal  studies, this drug was detected in in breast milk. For this reason, women should talk to their doctor about the risks and benefits of breast feeding during treatment with this drug because this drug may enter the breast milk and badly harm a breast feeding baby.     Bendamustine Kathi Der, Bend[place])   About This Drug Bendamustine is used to treat cancer. It is given in the vein (IV).  It takes 10 minutes to infuse.    Possible Side Effects  Bone marrow suppression. This is a decrease in the number of white blood cells, red blood cells, and platelets. This may raise your risk of infection, make you tired and weak (fatigue), and raise your risk of bleeding.    Soreness of the mouth and throat. You may have red areas, white patches, or sores that hurt.    Nausea and vomiting (throwing up)    Diarrhea (loose bowel movements)    Constipation (not able to move bowels)    Fever    Tiredness    Changes in your liver function    Decreased appetite (decreased hunger)    Weight loss    Headache    Cough, trouble breathing    Rash   Note: Each of the side effects above was reported in 15% or greater of patients treated with bendamustine. Not all possible side effects are included above.   Warnings and Precautions    Severe bone marrow suppression and infections, which may be life-threatening.    Allergic reactions, including anaphylaxis are rare but may happen in some patients. Signs of allergic reaction to this drug may be swelling of the face, feeling like your tongue or throat are swelling, trouble breathing, rash, itching, fever, chills, feeling dizzy, and/or feeling that your heart is beating in a fast or not normal way. If this happens, do not take another dose of this drug. You should get urgent medical treatment.       While you are getting this drug in your vein (IV), you may have a reaction to the drug. Sometimes you may be given medication to stop or lessen these side effects. Your nurse will check you closely for these signs: fever or shaking chills, flushing, facial swelling, feeling dizzy, headache, trouble breathing, rash, itching, chest tightness, or chest pain. These reactions may happen after your infusion. If this happens, call 911 for emergency care    Skin and tissue irritation may involve redness, pain, warmth, or swelling at the IV site. This happens if the drug leaks out of the vein and into nearby tissue.    Tumor lysis syndrome: This drug may act on the cancer cells very quickly. This may affect how your kidneys work.    Severe allergic skin reaction, which may be life-threatening. You may develop blisters on your skin that are filled with fluid or a severe red rash all over your body that may be painful.    Severe changes in your liver function, which may be life-threatening.    This drug may raise your risk of getting a second cancer such as leukemia.   Note: Some of the side effects above are very rare. If you have concerns and/or questions, please discuss them with your medical team.   Important Information  This drug may be present in the saliva, tears, sweat, urine, stool, vomit, semen, and vaginal secretions. Talk to your doctor and/or your nurse about the necessary precautions to take during this time.   Treating Side Effects  Manage tiredness by pacing your activities for the day.    Be sure to include periods of rest between energy-draining activities.    To decrease the risk of infections, wash your hands regularly.    Avoid close contact with people who have a cold, the flu, or other infections.    Take your temperature as your doctor or nurse tells you, and whenever you feel like you may have a fever.    To help decrease the risk of bleeding, use a soft toothbrush.  Check with your nurse before using dental floss.    Be very careful when using knives or tools.    Use an electric shaver instead of a razor.    Drink plenty of fluids (a minimum of eight glasses per day is recommended).    Mouth care is very important. Your mouth care should consist of routine, gentle cleaning of your teeth or dentures and rinsing your mouth with a mixture of 1/2 teaspoon of salt in 8 ounces of water or 1/2 teaspoon of baking soda in 8 ounces of water. This should be done at least after each meal and at bedtime.    If you have mouth sores, avoid mouthwash that has alcohol. Also avoid alcohol and smoking because they can bother your mouth and throat.    To help with nausea and vomiting, eat small, frequent meals instead of three large meals a day. Choose foods and drinks that are at room temperature. Ask your nurse or doctor about other helpful tips and medicine that is available to help stop or lessen these symptoms.    If you throw up or have loose bowel movements, you should drink more fluids so that you do not become dehydrated (lack of water in the body from losing too much fluid).    If you have diarrhea, eat low-fiber foods that are high in protein and calories and avoid foods that can irritate your digestive tracts or lead to cramping.    If you are not able to move your bowels, check with your doctor or nurse before you use enemas, laxatives, or suppositories.    Ask your doctor or nurse about medicines that are available to help stop or lessen constipation and/or diarrhea.    Infusion reactions may happen after your infusion. If this happens, call 911 for emergency care.    To help with weight loss, drink fluids that contribute calories (whole milk, juice, soft drinks, sweetened beverages, milkshakes, and nutritional supplements) instead of water.    To help with decreased appetite, eat small, frequent meals. Eat foods high in calories and protein, such as meat,  poultry, fish, dry beans, tofu, eggs, nuts, milk, yogurt, cheese, ice cream, pudding, and nutritional supplements.    Consider using sauces and spices to increase taste. Daily exercise, with your doctor's approval, may increase your appetite.    Keeping your pain under control is important to your well-being. Please tell your doctor or nurse if you are experiencing pain.    If you get a rash do not put anything on it unless your doctor or nurse says you may. Keep the area around the rash clean and dry. Ask your doctor for medicine if your rash bothers you.   Food and Drug Interactions  There are no known interactions of bendamustine with food.    Check with your doctor or pharmacist about all other prescription medicines and over-the-counter medicines and dietary supplements (vitamins, minerals, herbs and others) you are taking before  starting this medicine as there are known drug interactions with bendamustine. Also, check with your doctor or pharmacist before starting any new prescription or over-the-counter medicines, or dietary supplements to make sure that there are no interactions.   When to Call the Doctor Call your doctor or nurse if you have any of these symptoms and/or any new or unusual symptoms:    Fever of 100.4 F (38 C) or higher    Chills    Tiredness that interferes with your daily activities    Feeling dizzy or lightheaded    Coughing up yellow, green, or bloody mucus    Wheezing or trouble breathing    Headache that does not go away    Easy bleeding or bruising    No bowel movement in 3 days or when you feel uncomfortable.    Diarrhea, 4 times in one day or diarrhea with lack of strength or a feeling of being dizzy    Pain in your mouth or throat that makes it hard to eat or drink    Nausea that stops you from eating or drinking and/or is not relieved by prescribed medicines    Throwing up    Lasting loss of appetite or rapid weight loss of five pounds in  a week    Flu-like symptoms: fever, headache, muscle and joint aches, and fatigue (low energy, feeling weak)    A new rash or a rash that is not relieved by prescribed medicines    Signs of allergic reaction: swelling of the face, feeling like your tongue or throat are swelling, trouble breathing, rash, itching, fever, chills, feeling dizzy, and/or feeling that your heart is beating in a fast or not normal way. If this happens, call 911 for emergency care.    Signs of infusion reaction: fever or shaking chills, flushing, facial swelling, feeling dizzy, headache, trouble breathing, rash, itching, chest tightness, or chest pain. If this happens, call 911 for emergency care.    While you are getting this drug, please tell your nurse right away if you have any pain, redness, or swelling at the site of the IV infusion.    Signs of possible liver problems: dark urine, pale bowel movements, bad stomach pain, feeling very tired and weak, unusual itching, or yellowing of the eyes or skin    Signs of tumor lysis: confusion or agitation, decreased urine, nausea/vomiting, diarrhea, muscle cramping, numbness and/or tingling, seizures    If you think you may be pregnant, or may have impregnated your partner   Reproduction Warnings    Pregnancy warning: This drug can have harmful effects on the unborn baby. Women of childbearing potential should use effective methods of birth control during your cancer treatment and for at least 6 months after treatment. Men with female partners of childbearing potential should use effective methods of birth control during your cancer treatment and for at least 3 months after your cancer treatment. Let your doctor know right away if you think you may be pregnant or may have impregnated your partner.    Breastfeeding warning: It is not known if this drug passes into breast milk. For this reason, women should not breastfeed during treatment and for at least 1 week after  treatment because this drug could enter the breast milk and cause harm to a breastfeeding baby.    Fertility warning: In men this drug may affect your ability to have children in the future. Talk with your doctor or nurse if you plan to have  children. Ask for information on sperm banking.    SELF CARE ACTIVITIES WHILE ON CHEMOTHERAPY/IMMUNOTHERAPY:  Hydration Increase your fluid intake and drink at least 64 ounces (2 liters) of water/decaffeinated beverages per day after treatment. You can still have your cup of coffee or soda but these beverages do not count as part of the 64 ounces that you need to drink daily. Limit alcohol intake.  Medications Continue taking your normal prescription medication as prescribed.  If you start any new herbal or new supplements please let us know first to make sure it is safe.  Mouth Care Have teeth cleaned professionally before starting treatment. Keep dentures and partial plates clean. Use soft toothbrush and do not use mouthwashes that contain alcohol. Biotene is a good mouthwash that is available at most pharmacies or may be ordered by calling [phone]. Use warm salt water gargles (1 teaspoon salt per 1 quart warm water) before and after meals and at bedtime. If you are still having problems with your mouth or sores in your mouth please call the clinic. If you need dental work, please let the doctor know before you go for your appointment so that we can coordinate the best possible time for you in regards to your chemo regimen. You need to also let your dentist know that you are actively taking chemo. We may need to do labs prior to your dental appointment.  Skin Care Always use sunscreen that has not expired and with SPF (Sun Protection Factor) of 50 or higher. Wear hats to protect your head from the sun. Remember to use sunscreen on your hands, ears, face, & feet.  Use good moisturizing lotions such as udder cream, eucerin, or even Vaseline. Some  chemotherapies can cause dry skin, color changes in your skin and nails.    Avoid long, hot showers or baths. Use gentle, fragrance-free soaps and laundry detergent. Use moisturizers, preferably creams or ointments rather than lotions because the thicker consistency is better at preventing skin dehydration. Apply the cream or ointment within 15 minutes of showering. Reapply moisturizer at night, and moisturize your hands every time after you wash them.   Infection Prevention Please wash your hands for at least 30 seconds using warm soapy water. Handwashing is the #1 way to prevent the spread of germs. Stay away from sick people or people who are getting over a cold. If you develop respiratory systems such as green/yellow mucus production or productive cough or persistent cough let us know and we will see if you need an antibiotic. It is a good idea to keep a pair of gloves on when going into grocery stores/[employer] to decrease your risk of coming into contact with germs on the carts, etc. Carry alcohol hand gel with you at all times and use it frequently if out in public. If your temperature reaches 100.5 or higher please call the clinic and let us know.  If it is after hours or on the weekend please go to the ER if your temperature is over 100.4.  Please have your own personal thermometer at home to use.    Sex and bodily fluids If you are going to have sex, a condom must be used to protect the person that isn't taking immunotherapy. For a few days after treatment, immunotherapy can be excreted through your bodily fluids.  When using the toilet please close the lid and flush the toilet twice.  Do this for a few day after you have had immunotherapy.  Contraception It is not known for sure whether or not immunotherapy drugs can be passed on through semen or secretions from the vagina. Because of this some doctors advise people to use a barrier method if you have sex during treatment. This applies to  vaginal, anal or oral sex.  Generally, doctors advise a barrier method only for the time you are actually having the treatment and for about a week after your treatment.  Advice like this can be worrying, but this does not mean that you have to avoid being intimate with your partner. You can still have close contact with your partner and continue to enjoy sex.  Animals If you have cats or birds we ask that you not change the litter or change the cage.  Please have someone else do this for you while you are on immunotherapy.   Food Safety During and After Cancer Treatment Food safety is important for people both during and after cancer treatment. Cancer and cancer treatments, such as chemotherapy, radiation therapy, and stem cell/bone marrow transplantation, often weaken the immune system. This makes it harder for your body to protect itself from foodborne illness, also called food poisoning. Foodborne illness is caused by eating food that contains harmful bacteria, parasites, or viruses.  Foods to avoid Some foods have a higher risk of becoming tainted with bacteria. These include: Unwashed fresh fruit and vegetables, especially leafy vegetables that can hide dirt and other contaminants Raw sprouts, such as alfalfa sprouts Raw or undercooked beef, especially ground beef, or other raw or undercooked meat and poultry Fatty, fried, or spicy foods immediately before or after treatment.  These can sit heavy on your stomach and make you feel nauseous. Raw or undercooked shellfish, such as oysters. Sushi and sashimi, which often contain raw fish.  Unpasteurized beverages, such as unpasteurized fruit juices, raw milk, raw yogurt, or cider Undercooked eggs, such as soft boiled, over easy, and poached; raw, unpasteurized eggs; or foods made with raw egg, such as homemade raw cookie dough and homemade mayonnaise  Simple steps for food safety  Shop smart. Do not buy food stored or displayed in an  unclean area. Do not buy bruised or damaged fruits or vegetables. Do not buy cans that have cracks, dents, or bulges. Pick up foods that can spoil at the end of your shopping trip and store them in a cooler on the way home.  Prepare and clean up foods carefully. Rinse all fresh fruits and vegetables under running water, and dry them with a clean towel or paper towel. Clean the top of cans before opening them. After preparing food, wash your hands for 20 seconds with hot water and soap. Pay special attention to areas between fingers and under nails. Clean your utensils and dishes with hot water and soap. Disinfect your kitchen and cutting boards using 1 teaspoon of liquid, unscented bleach mixed into 1 quart of water.    Dispose of old food. Eat canned and packaged food before its expiration date (the "use by" or "best before" date). Consume refrigerated leftovers within 3 to 4 days. After that time, throw out the food. Even if the food does not smell or look spoiled, it still may be unsafe. Some bacteria, such as Listeria, can grow even on foods stored in the refrigerator if they are kept for too long.  Take precautions when eating out. At restaurants, avoid buffets and salad bars where food sits out for a long time and comes in contact with many  people. Food can become contaminated when someone with a virus, often a norovirus, or another "bug" handles it. Put any leftover food in a "to-go" container yourself, rather than having the server do it. And, refrigerate leftovers as soon as you get home. Choose restaurants that are clean and that are willing to prepare your food as you order it cooked.    SYMPTOMS TO REPORT AS SOON AS POSSIBLE AFTER TREATMENT:  FEVER GREATER THAN 100.4 F CHILLS WITH OR WITHOUT FEVER NAUSEA AND VOMITING THAT IS NOT CONTROLLED WITH YOUR NAUSEA MEDICATION UNUSUAL SHORTNESS OF BREATH UNUSUAL BRUISING OR BLEEDING TENDERNESS IN MOUTH AND THROAT WITH OR WITHOUT  PRESENCE OF ULCERS URINARY PROBLEMS BOWEL PROBLEMS UNUSUAL RASH     Wear comfortable clothing and clothing appropriate for easy access to any Portacath or PICC line. Let us know if there is anything that we can do to make your therapy better!   What to do if you need assistance after hours or on the weekends: CALL [phone].  HOLD on the line, do not hang up.  You will hear multiple messages but at the end you will be connected with a nurse triage line.  They will contact the doctor if necessary.  Most of the time they will be able to assist you.  Do not call the hospital operator.    I have been informed and understand all of the instructions given to me and have received a copy. I have been instructed to call the clinic [phone] or my family physician as soon as possible for continued medical care, if indicated. I do not have any more questions at this time but understand that I may call the Cancer Center or the Patient Navigator at [phone] during office hours should I have questions or need assistance in obtaining follow-up care.

## 2022-08-31 ENCOUNTER — Inpatient Hospital Stay: Payer: Medicaid Other

## 2022-08-31 ENCOUNTER — Encounter: Payer: Self-pay | Admitting: Hematology

## 2022-08-31 DIAGNOSIS — Z95828 Presence of other vascular implants and grafts: Secondary | ICD-10-CM | POA: Insufficient documentation

## 2022-08-31 DIAGNOSIS — C8208 Follicular lymphoma grade I, lymph nodes of multiple sites: Secondary | ICD-10-CM

## 2022-08-31 HISTORY — DX: Presence of other vascular implants and grafts: Z95.828

## 2022-08-31 MED ORDER — ALLOPURINOL 300 MG PO TABS
300.0000 mg | ORAL_TABLET | Freq: Every day | ORAL | 3 refills | Status: DC
Start: 2022-08-31 — End: 2022-11-30

## 2022-08-31 MED ORDER — LIDOCAINE-PRILOCAINE 2.5-2.5 % EX CREA
TOPICAL_CREAM | CUTANEOUS | 3 refills | Status: DC
Start: 2022-08-31 — End: 2022-11-30

## 2022-08-31 MED ORDER — PROCHLORPERAZINE MALEATE 10 MG PO TABS
10.0000 mg | ORAL_TABLET | Freq: Four times a day (QID) | ORAL | 1 refills | Status: DC | PRN
Start: 2022-08-31 — End: 2022-09-08

## 2022-08-31 NOTE — Progress Notes (Signed)

## 2022-09-01 ENCOUNTER — Ambulatory Visit: Payer: Medicaid Other | Attending: Cardiovascular Disease | Admitting: *Deleted

## 2022-09-01 DIAGNOSIS — Z5181 Encounter for therapeutic drug level monitoring: Secondary | ICD-10-CM

## 2022-09-01 DIAGNOSIS — Q231 Congenital insufficiency of aortic valve: Secondary | ICD-10-CM

## 2022-09-01 DIAGNOSIS — I4892 Unspecified atrial flutter: Secondary | ICD-10-CM

## 2022-09-01 LAB — POCT INR: INR: 2.8 (ref 2.0–3.0)

## 2022-09-01 NOTE — Patient Instructions (Signed)
Continue warfarin 4 tablets daily. Having first chemo treatment 5/7 & 5/8 Recheck INR in 1 wk after treatment

## 2022-09-08 ENCOUNTER — Inpatient Hospital Stay (HOSPITAL_BASED_OUTPATIENT_CLINIC_OR_DEPARTMENT_OTHER): Payer: Medicaid Other | Admitting: Hematology

## 2022-09-08 ENCOUNTER — Inpatient Hospital Stay: Payer: Medicaid Other

## 2022-09-08 ENCOUNTER — Inpatient Hospital Stay: Payer: Medicaid Other | Attending: Hematology

## 2022-09-08 VITALS — BP 108/44 | HR 76 | Temp 98.1°F | Resp 20 | Wt 109.8 lb

## 2022-09-08 VITALS — BP 160/55

## 2022-09-08 DIAGNOSIS — C8208 Follicular lymphoma grade I, lymph nodes of multiple sites: Secondary | ICD-10-CM

## 2022-09-08 DIAGNOSIS — Z5112 Encounter for antineoplastic immunotherapy: Secondary | ICD-10-CM | POA: Diagnosis present

## 2022-09-08 DIAGNOSIS — E876 Hypokalemia: Secondary | ICD-10-CM | POA: Insufficient documentation

## 2022-09-08 DIAGNOSIS — Z5189 Encounter for other specified aftercare: Secondary | ICD-10-CM | POA: Insufficient documentation

## 2022-09-08 DIAGNOSIS — Z95828 Presence of other vascular implants and grafts: Secondary | ICD-10-CM

## 2022-09-08 DIAGNOSIS — C8204 Follicular lymphoma grade I, lymph nodes of axilla and upper limb: Secondary | ICD-10-CM | POA: Insufficient documentation

## 2022-09-08 DIAGNOSIS — Z5111 Encounter for antineoplastic chemotherapy: Secondary | ICD-10-CM | POA: Insufficient documentation

## 2022-09-08 LAB — CBC WITH DIFFERENTIAL/PLATELET
Abs Immature Granulocytes: 0 10*3/uL (ref 0.00–0.07)
Basophils Absolute: 0 10*3/uL (ref 0.0–0.1)
Basophils Relative: 0 %
Eosinophils Absolute: 0.2 10*3/uL (ref 0.0–0.5)
Eosinophils Relative: 1 %
HCT: 34.8 % — ABNORMAL LOW (ref 36.0–46.0)
Hemoglobin: 10.8 g/dL — ABNORMAL LOW (ref 12.0–15.0)
Lymphocytes Relative: 51 %
Lymphs Abs: 10.7 10*3/uL — ABNORMAL HIGH (ref 0.7–4.0)
MCH: 25.7 pg — ABNORMAL LOW (ref 26.0–34.0)
MCHC: 31 g/dL (ref 30.0–36.0)
MCV: 82.9 fL (ref 80.0–100.0)
Monocytes Absolute: 0.2 10*3/uL (ref 0.1–1.0)
Monocytes Relative: 1 %
Neutro Abs: 9.9 10*3/uL — ABNORMAL HIGH (ref 1.7–7.7)
Neutrophils Relative %: 47 %
Platelets: 383 10*3/uL (ref 150–400)
RBC: 4.2 MIL/uL (ref 3.87–5.11)
RDW: 16.8 % — ABNORMAL HIGH (ref 11.5–15.5)
WBC: 21 10*3/uL — ABNORMAL HIGH (ref 4.0–10.5)
nRBC: 0 % (ref 0.0–0.2)

## 2022-09-08 LAB — COMPREHENSIVE METABOLIC PANEL
ALT: 20 U/L (ref 0–44)
AST: 29 U/L (ref 15–41)
Albumin: 3.8 g/dL (ref 3.5–5.0)
Alkaline Phosphatase: 81 U/L (ref 38–126)
Anion gap: 13 (ref 5–15)
BUN: 14 mg/dL (ref 6–20)
CO2: 29 mmol/L (ref 22–32)
Calcium: 8.7 mg/dL — ABNORMAL LOW (ref 8.9–10.3)
Chloride: 94 mmol/L — ABNORMAL LOW (ref 98–111)
Creatinine, Ser: 0.86 mg/dL (ref 0.44–1.00)
GFR, Estimated: >60 mL/min (ref >60)
Glucose, Bld: 156 mg/dL — ABNORMAL HIGH (ref 70–99)
Potassium: 2.7 mmol/L — CL (ref 3.5–5.1)
Sodium: 136 mmol/L (ref 135–145)
Total Bilirubin: 0.7 mg/dL (ref 0.3–1.2)
Total Protein: 7 g/dL (ref 6.5–8.1)

## 2022-09-08 LAB — MAGNESIUM: Magnesium: 2.4 mg/dL (ref 1.7–2.4)

## 2022-09-08 LAB — URIC ACID: Uric Acid, Serum: 5.8 mg/dL (ref 2.5–7.1)

## 2022-09-08 MED ORDER — POTASSIUM CHLORIDE IN NACL 20-0.9 MEQ/L-% IV SOLN
Freq: Once | INTRAVENOUS | Status: AC
  Filled 2022-09-08: qty 1000

## 2022-09-08 MED ORDER — SODIUM CHLORIDE 0.9% FLUSH
10.0000 mL | Freq: Once | INTRAVENOUS | Status: AC
  Administered 2022-09-08: 10 mL via INTRAVENOUS

## 2022-09-08 MED ORDER — FAMOTIDINE IN NACL 20-0.9 MG/50ML-% IV SOLN: 20.0000 mg | Freq: Once | INTRAVENOUS | Status: DC

## 2022-09-08 MED ORDER — PALONOSETRON HCL INJECTION 0.25 MG/5ML
0.2500 mg | Freq: Once | INTRAVENOUS | Status: AC
  Administered 2022-09-08: 0.25 mg via INTRAVENOUS
  Filled 2022-09-08: qty 5

## 2022-09-08 MED ORDER — PROMETHAZINE HCL 25 MG PO TABS: 25.0000 mg | ORAL_TABLET | Freq: Four times a day (QID) | ORAL | 3 refills | Status: DC | PRN

## 2022-09-08 MED ORDER — SODIUM CHLORIDE 0.9 % IV SOLN
60.0000 mg/m2 | Freq: Once | INTRAVENOUS | Status: AC
  Administered 2022-09-08: 87.5 mg via INTRAVENOUS
  Filled 2022-09-08: qty 3.5

## 2022-09-08 MED ORDER — SODIUM CHLORIDE 0.9 % IV SOLN
375.0000 mg/m2 | Freq: Once | INTRAVENOUS | Status: AC
  Administered 2022-09-08: 500 mg via INTRAVENOUS
  Filled 2022-09-08: qty 50

## 2022-09-08 MED ORDER — POTASSIUM CHLORIDE CRYS ER 20 MEQ PO TBCR
40.0000 meq | EXTENDED_RELEASE_TABLET | Freq: Once | ORAL | Status: AC
  Administered 2022-09-08: 40 meq via ORAL
  Filled 2022-09-08: qty 2

## 2022-09-08 MED ORDER — FAMOTIDINE 20 MG IN NS 100 ML IVPB
20.0000 mg | Freq: Once | INTRAVENOUS | Status: AC
  Administered 2022-09-08: 20 mg via INTRAVENOUS
  Filled 2022-09-08: qty 100

## 2022-09-08 MED ORDER — SODIUM CHLORIDE 0.9 % IV SOLN: Freq: Once | INTRAVENOUS | Status: AC

## 2022-09-08 MED ORDER — SODIUM CHLORIDE 0.9 % IV SOLN
10.0000 mg | Freq: Once | INTRAVENOUS | Status: AC
  Administered 2022-09-08: 10 mg via INTRAVENOUS
  Filled 2022-09-08: qty 1

## 2022-09-08 MED ORDER — CETIRIZINE HCL 10 MG/ML IV SOLN
10.0000 mg | Freq: Once | INTRAVENOUS | Status: AC
  Administered 2022-09-08: 10 mg via INTRAVENOUS
  Filled 2022-09-08: qty 1

## 2022-09-08 MED ORDER — SODIUM CHLORIDE 0.9% FLUSH
10.0000 mL | INTRAVENOUS | Status: DC | PRN
  Administered 2022-09-08: 10 mL

## 2022-09-08 MED ORDER — ACETAMINOPHEN 325 MG PO TABS
650.0000 mg | ORAL_TABLET | Freq: Once | ORAL | Status: AC
  Administered 2022-09-08: 650 mg via ORAL
  Filled 2022-09-08: qty 2

## 2022-09-08 MED ORDER — LIDOCAINE 5 % EX PTCH
1.0000 | MEDICATED_PATCH | Freq: Once | CUTANEOUS | Status: DC
  Administered 2022-09-08: 1 via TRANSDERMAL
  Filled 2022-09-08: qty 1

## 2022-09-08 MED ORDER — DIPHENHYDRAMINE HCL 25 MG PO CAPS: 50.0000 mg | ORAL_CAPSULE | Freq: Once | ORAL | Status: DC

## 2022-09-08 MED ORDER — HEPARIN SOD (PORK) LOCK FLUSH 100 UNIT/ML IV SOLN
500.0000 [IU] | Freq: Once | INTRAVENOUS | Status: AC | PRN
  Administered 2022-09-08: 500 [IU]

## 2022-09-08 NOTE — Progress Notes (Signed)
Pharmacist Chemotherapy Monitoring - Initial Assessment    Anticipated start date: 09/08/22   The following has been reviewed per standard work regarding the patient's treatment regimen: The patient's diagnosis, treatment plan and drug doses, and organ/hematologic function Lab orders and baseline tests specific to treatment regimen  The treatment plan start date, drug sequencing, and pre-medications Prior authorization status  Patient's documented medication list, including drug-drug interaction screen and prescriptions for anti-emetics and supportive care specific to the treatment regimen The drug concentrations, fluid compatibility, administration routes, and timing of the medications to be used The patient's access for treatment and lifetime cumulative dose history, if applicable  The patient's medication allergies and previous infusion related reactions, if applicable   Changes made to treatment plan:  N/A  Follow up needed:  N/A  Discontinue diphenhydramine from oncology treatment plan --> Add Quzyttir (cetirizine) 10 mg IVPush x 1 as premedication for oncology treatment plan.  T.O. Dr Legrand Pitts Yetta Barre, PharmD   [facility] [place], [facility], 09/08/2022  9:41 AM

## 2022-09-08 NOTE — Progress Notes (Signed)
[facility] [address] [place] [address]    Clinic Day:  09/08/2022  Referring physician: Leone Payor, FNP  Patient Care Team: Leone Payor, FNP as PCP - General (Family Medicine) Wendall Stade, MD as PCP - Cardiology (Cardiology) Benita Stabile, MD as PCP - Internal Medicine (Internal Medicine) Marinus Maw, MD as PCP - Electrophysiology (Cardiology) Pricilla Riffle, MD as Consulting Physician (Cardiology) Doreatha Massed, MD as Medical Oncologist (Medical Oncology) Therese Sarah, RN as Oncology Nurse Navigator (Medical Oncology)   ASSESSMENT & PLAN:   Assessment: 1.  Stage IV follicular lymphoma, grade 1: - CT chest (06/03/2022): Newly enlarged bilateral axillary, subpectoral, low cervical, mediastinal, hilar and most likely upper abdominal lymph nodes.  No evidence of fibrotic interstitial lung disease. - PET scan (06/18/2022): Generalized lymphadenopathy more in the abdomen and retroperitoneum but seen throughout the low neck, chest, abdomen and pelvis, with SUV ranging between 2-4.  Spleen top normal size at 12 cm with FDG uptake slightly less than the liver.  Hepatic steatosis.  Low-level diffuse FDG activity within the marrow spaces throughout the spine and pelvis with no focal area of increased metabolic activity. - No B symptoms.  She is having more night sweats lately every night. 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  Also has paroxysmal AF. - Right groin lymph node biopsy (06/25/2022): Follicular lymphoma, grade 1 - She complains of severe fatigue for the last 6 to 8 months, gradually worsening and interfering with her day-to-day activities. - BMBX (08/10/2022): Hypercellular bone marrow with involvement by follicular lymphoma (70%)   2.  Social/family history: - Lives at home with her husband  and son.  Seen with daughter Irving Burton today.  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  No chemical exposure.  Non-smoker.  Secondhand smoke exposure present. - Maternal grandmother died of leukemia.  Maternal grandfather had lung cancer.  Paternal grandfather had lung cancer.  Paternal great aunt had cancer.  Maternal uncle had breast cancer x 2.    Plan: 1.  Stage IV follicular lymphoma, grade 1: - Her atrial fibrillation is rate controlled. - Reviewed labs today: Normal LFTs and creatinine.  CBC grossly normal. - We talked about cycle 1 of Bendamustine and rituximab today.  We discussed rare chance of allergic reactions. - We will dose reduce Bendamustine to 60 mg/m for cycle 1 to see how she tolerates it. - She will proceed with her cycle 1 today.  She will be evaluated in symptom management clinic in 2 weeks.  RTC 4 weeks with me prior to cycle 2.   2.  Worsening shortness of breath: - Continue prednisone 20 mg daily which was started on 04/30/2022.  3.  Hypokalemia: - Potassium today is 2.7.  Will replete potassium in the clinic. - Continue potassium 40 mEq twice daily.  4.  Nausea/vomiting prophylaxis: - We have sent prescription for Phenergan which does not interact with the Tikosyn. - She will use Lomotil for diarrhea as it is less likely to interact with Tikosyn.    Orders Placed This Encounter  Procedures   Magnesium    Standing Status:   Future    Standing Expiration Date:   10/06/2023   Uric acid    Standing Status:   Future  Standing Expiration Date:   10/06/2023      I,Katie Daubenspeck,acting as a scribe for Doreatha Massed, MD.,have documented all relevant documentation on the behalf of Doreatha Massed, MD,as directed by  Doreatha Massed, MD while in the presence of Doreatha Massed, MD.   I, Doreatha Massed MD, have reviewed the above documentation for accuracy and completeness, and I agree with  the above.   Doreatha Massed, MD   5/7/20246:08 PM  CHIEF COMPLAINT:   Diagnosis: Grade 1 stage III follicular lymphoma    Cancer Staging  Follicular lymphoma grade I (HCC) Staging form: Hodgkin and Non-Hodgkin Lymphoma, AJCC 8th Edition - Clinical stage from 07/05/2022: Stage IV (Follicular lymphoma) - Unsigned    Prior Therapy: none  Current Therapy:  Bendamustine and rituximab    HISTORY OF PRESENT ILLNESS:   Oncology History  Follicular lymphoma grade I (HCC)  07/05/2022 Initial Diagnosis   Follicular lymphoma grade I (HCC)   09/08/2022 -  Chemotherapy   Patient is on Treatment Plan : NON-HODGKINS LYMPHOMA Rituximab D1 + Bendamustine D1,2 q28d x 6 cycles        INTERVAL HISTORY:   Mallory Sutton is a 62 y.o. female presenting to clinic today for follow up of Grade 1 stage III follicular lymphoma. She was last seen by me on 08/18/22.  Today, she states that she is doing well overall. Her appetite level is at 100%. Her energy level is at 25%.  PAST MEDICAL HISTORY:   Past Medical History: Past Medical History:  Diagnosis Date   Aortic stenosis    ATRIAL ARRHYTHMIAS    Bicuspid aortic valve    CHF (congestive heart failure) (HCC)    COARCTATION OF AORTA    Dysrhythmia    ENDOMETRIOSIS    Heart murmur    Paroxysmal atrial fibrillation (HCC)    Port-A-Cath in place 08/31/2022   Shortness of breath    THORACIC AORTIC ANEURYSM    Vasculitis (HCC)     Surgical History: Past Surgical History:  Procedure Laterality Date   CARDIOVERSION N/A 08/29/2019   Procedure: CARDIOVERSION;  Surgeon: Antoine Poche, MD;  Location: [facility];  Service: Endoscopy;  Laterality: N/A;   CARDIOVERSION N/A 07/16/2022   Procedure: CARDIOVERSION;  Surgeon: Sande Rives, MD;  Location: [facility] ENDOSCOPY;  Service: Cardiovascular;  Laterality: N/A;   coarctation repair and residual restenosis     IR BONE MARROW BIOPSY & ASPIRATION  08/10/2022   IR IMAGING GUIDED PORT INSERTION   08/10/2022   KNEE SURGERY     LYMPH NODE BIOPSY Right 06/25/2022   Procedure: LYMPH NODE BIOPSY; right groin;  Surgeon: Lucretia Roers, MD;  Location: [facility];  Service: General;  Laterality: Right;   PORTA CATH INSERTION     TEE WITHOUT CARDIOVERSION N/A 07/12/2019   Procedure: TRANSESOPHAGEAL ECHOCARDIOGRAM (TEE);  Surgeon: Quintella Reichert, MD;  Location: [facility] ENDOSCOPY;  Service: Cardiovascular;  Laterality: N/A;   TEE WITHOUT CARDIOVERSION N/A 08/29/2019   Procedure: TRANSESOPHAGEAL ECHOCARDIOGRAM (TEE) WITH PROPOFOL;  Surgeon: Antoine Poche, MD;  Location: [facility];  Service: Endoscopy;  Laterality: N/A;    Social History: Social History   Socioeconomic History   Marital status: Married    Spouse name: Not on file   Number of children: Not on file   Years of education: Not on file   Highest education level: Not on file  Occupational History   Not on file  Tobacco Use   Smoking status: Never    Passive exposure: Past  Smokeless tobacco: Never   Tobacco comments:    Never smoke 07/14/22  Vaping Use   Vaping Use: Never used  Substance and Sexual Activity   Alcohol use: Yes    Comment: 3 beers 3 nights a week 07/14/22   Drug use: No   Sexual activity: Never  Other Topics Concern   Not on file  Social History Narrative   Not on file   Social Determinants of Health   Financial Resource Strain: High Risk (07/02/2022)   Overall Financial Resource Strain (CARDIA)    Difficulty of Paying Living Expenses: Very hard  Food Insecurity: Food Insecurity Present (07/15/2022)   Hunger Vital Sign    Worried About Running Out of Food in the Last Year: Sometimes true    Ran Out of Food in the Last Year: Sometimes true  Transportation Needs: No Transportation Needs (07/17/2022)   PRAPARE - Administrator, Civil Service (Medical): No    Lack of Transportation (Non-Medical): No  Physical Activity: Not on file  Stress: Not on file  Social Connections: Not on file   Intimate Partner Violence: Not At Risk (07/17/2022)   Humiliation, Afraid, Rape, and Kick questionnaire    Fear of Current or Ex-Partner: No    Emotionally Abused: No    Physically Abused: No    Sexually Abused: No    Family History: Family History  Problem Relation Age of Onset   Suicidality Mother    Mental illness Mother    Drug abuse Brother    Healthy Daughter    Healthy Son     Current Medications:  Current Outpatient Medications:    acetaminophen (TYLENOL) 325 MG tablet, Take 2 tablets (650 mg total) by mouth every 6 (six) hours as needed for mild pain (or Fever >/= 101)., Disp: 100 tablet, Rfl: 1   albuterol (VENTOLIN HFA) 108 (90 Base) MCG/ACT inhaler, Inhale 2 puffs into the lungs every 4 (four) hours as needed for wheezing or shortness of breath., Disp: 54 g, Rfl: 6   allopurinol (ZYLOPRIM) 300 MG tablet, Take 1 tablet (300 mg total) by mouth daily., Disp: 30 tablet, Rfl: 3   ALPRAZolam (XANAX) 0.5 MG tablet, Take 0.5 mg by mouth 3 (three) times daily., Disp: , Rfl:    aspirin EC 81 MG tablet, Take 1 tablet (81 mg total) by mouth daily with breakfast., Disp: 30 tablet, Rfl: 11   BENDAMUSTINE HCL IV, Inject into the vein every 28 (twenty-eight) days. Days 1&2 every 28 days, Disp: , Rfl:    Budeson-Glycopyrrol-Formoterol (BREZTRI AEROSPHERE) 160-9-4.8 MCG/ACT AERO, Inhale 2 puffs into the lungs 2 (two) times daily., Disp: 10.7 g, Rfl: 1   calcium-vitamin D (OSCAL WITH D) 500-5 MG-MCG tablet, Take 1 tablet by mouth daily., Disp: 30 tablet, Rfl: 2   dofetilide (TIKOSYN) 250 MCG capsule, Take 1 capsule (250 mcg total) by mouth 2 (two) times daily., Disp: 60 capsule, Rfl: 6   feeding supplement (ENSURE ENLIVE / ENSURE PLUS) LIQD, Take 237 mLs by mouth 2 (two) times daily between meals., Disp: , Rfl:    furosemide (LASIX) 40 MG tablet, Take 2 tablets (80 mg total) by mouth daily., Disp: 30 tablet, Rfl: 3   gabapentin (NEURONTIN) 300 MG capsule, Take 300-600 mg by mouth See admin  instructions. Take 300 mg tablet by mouth in the morning, 300 mg tablet by mouth in the afternoon, and then take two of the 300 mg tablets by mouth for total of 600 mg in the evening per  patient, Disp: , Rfl:    HYDROcodone-acetaminophen (NORCO/VICODIN) 5-325 MG tablet, Take 2 tablets by mouth 2 (two) times daily., Disp: , Rfl:    ipratropium-albuterol (DUONEB) 0.5-2.5 (3) MG/3ML SOLN, Inhale 3 mLs into the lungs 2 (two) times daily as needed (shortness of breath)., Disp: 360 mL, Rfl: 1   lidocaine-prilocaine (EMLA) cream, Apply a small amount to port a cath site and cover with plastic wrap 1 hour prior to infusion appointments, Disp: 30 g, Rfl: 3   magnesium oxide (MAGOX 400) 400 (240 Mg) MG tablet, Take 1 tablet (400 mg total) by mouth daily., Disp: 30 tablet, Rfl: 2   metoprolol succinate (TOPROL-XL) 25 MG 24 hr tablet, Take 1 tablet (25 mg total) by mouth daily., Disp: 30 tablet, Rfl: 0   metoprolol succinate (TOPROL-XL) 50 MG 24 hr tablet, Take 1 tablet (50 mg total) by mouth daily. Take with or immediately following a meal., Disp: 30 tablet, Rfl: 11   Multiple Vitamin (MULTI-VITAMINS) TABS, Take 1 tablet by mouth daily. , Disp: , Rfl:    pantoprazole (PROTONIX) 40 MG tablet, Take 1 tablet (40 mg total) by mouth daily., Disp: 30 tablet, Rfl: 2   potassium chloride SA (KLOR-CON M) 20 MEQ tablet, Take 2 tablets (40 mEq total) by mouth 2 (two) times daily., Disp: 120 tablet, Rfl: 3   predniSONE (DELTASONE) 20 MG tablet, Take 1 tablet (20 mg total) by mouth daily with breakfast., Disp: 90 tablet, Rfl: 3   promethazine (PHENERGAN) 25 MG tablet, Take 1 tablet (25 mg total) by mouth every 6 (six) hours as needed for nausea or vomiting., Disp: 30 tablet, Rfl: 3   riTUXimab (RITUXAN IV), Inject into the vein every 28 (twenty-eight) days., Disp: , Rfl:    venlafaxine (EFFEXOR) 75 MG tablet, Take 75 mg by mouth 2 (two) times daily., Disp: , Rfl:    warfarin (COUMADIN) 1 MG tablet, Take 3 tablets daily except  4 tablets on Fridays or as directed by coumadin clinic (Patient taking differently: Take 1-4 mg by mouth See admin instructions. Take 3 tablets every day except on Fridays take four of the 1 mg tablets to make full dose of 4 mg or as directed by coumadin clinic), Disp: 100 tablet, Rfl: 5   zolpidem (AMBIEN) 10 MG tablet, Take 1 tablet (10 mg total) by mouth at bedtime as needed for sleep. (Patient taking differently: Take 5 mg by mouth at bedtime as needed for sleep.), Disp: 10 tablet, Rfl: 0 No current facility-administered medications for this visit.  Facility-Administered Medications Ordered in Other Visits:    lidocaine (LIDODERM) 5 % 1 patch, 1 patch, Transdermal, Once, Doreatha Massed, MD, 1 patch at 09/08/22 1132   sodium chloride flush (NS) 0.9 % injection 10 mL, 10 mL, Intracatheter, PRN, Doreatha Massed, MD, 10 mL at 09/08/22 1522   Allergies: Allergies  Allergen Reactions   Amiodarone Nausea Only   Penicillins Hives    Has patient had a PCN reaction causing immediate rash, facial/tongue/throat swelling, SOB or lightheadedness with hypotension: YES Has patient had a PCN reaction causing severe rash involving mucus membranes or skin necrosis: NO Has patient had a PCN reaction that required hospitalization NO Has patient had a PCN reaction occurring within the last 10 years: NO If all of the above answers are "NO", then may proceed with Cephalosporin use.     REVIEW OF SYSTEMS:   Review of Systems  Constitutional:  Negative for chills, fatigue and fever.  HENT:   Negative for lump/mass,  mouth sores, nosebleeds, sore throat and trouble swallowing.   Eyes:  Negative for eye problems.  Respiratory:  Positive for cough and shortness of breath.   Cardiovascular:  Negative for chest pain, leg swelling and palpitations.  Gastrointestinal:  Negative for abdominal pain, constipation, diarrhea, nausea and vomiting.  Genitourinary:  Negative for bladder incontinence, difficulty  urinating, dysuria, frequency, hematuria and nocturia.   Musculoskeletal:  Negative for arthralgias, back pain, flank pain, myalgias and neck pain.  Skin:  Negative for itching and rash.  Neurological:  Positive for headaches and numbness. Negative for dizziness.  Hematological:  Does not bruise/bleed easily.  Psychiatric/Behavioral:  Positive for depression. Negative for sleep disturbance and suicidal ideas. The patient is not nervous/anxious.   All other systems reviewed and are negative.    VITALS:   Blood pressure (!) 160/55, last menstrual period 07/16/2013.  Wt Readings from Last 3 Encounters:  09/08/22 109 lb 12.8 oz (49.8 kg)  08/24/22 111 lb 3.2 oz (50.4 kg)  08/21/22 108 lb (49 kg)    There is no height or weight on file to calculate BMI.  Performance status (ECOG): 1 - Symptomatic but completely ambulatory  PHYSICAL EXAM:   Physical Exam Vitals and nursing note reviewed. Exam conducted with a chaperone present.  Constitutional:      Appearance: Normal appearance.  Cardiovascular:     Rate and Rhythm: Normal rate and regular rhythm.     Pulses: Normal pulses.     Heart sounds: Normal heart sounds.  Pulmonary:     Effort: Pulmonary effort is normal.     Breath sounds: Normal breath sounds.  Abdominal:     Palpations: Abdomen is soft. There is no hepatomegaly, splenomegaly or mass.     Tenderness: There is no abdominal tenderness.  Musculoskeletal:     Right lower leg: No edema.     Left lower leg: No edema.  Lymphadenopathy:     Cervical: No cervical adenopathy.     Right cervical: No superficial, deep or posterior cervical adenopathy.    Left cervical: No superficial, deep or posterior cervical adenopathy.     Upper Body:     Right upper body: No supraclavicular or axillary adenopathy.     Left upper body: No supraclavicular or axillary adenopathy.  Neurological:     General: No focal deficit present.     Mental Status: She is alert and oriented to person,  place, and time.  Psychiatric:        Mood and Affect: Mood normal.        Behavior: Behavior normal.     LABS:      Latest Ref Rng & Units 09/08/2022    8:13 AM 08/17/2022    3:30 PM 08/10/2022    6:45 AM  CBC  WBC 4.0 - 10.5 K/uL 21.0  25.0  16.6   Hemoglobin 12.0 - 15.0 g/dL 16.1  09.6  04.5   Hematocrit 36.0 - 46.0 % 34.8  36.5  32.9   Platelets 150 - 400 K/uL 383  448  385       Latest Ref Rng & Units 09/08/2022    8:13 AM 08/21/2022   12:34 PM 08/17/2022    3:30 PM  CMP  Glucose 70 - 99 mg/dL 409  71  811   BUN 6 - 20 mg/dL 14  16  22    Creatinine 0.44 - 1.00 mg/dL 9.14  7.82  9.56   Sodium 135 - 145 mmol/L 136  142  137   Potassium 3.5 - 5.1 mmol/L 2.7  4.1  3.4   Chloride 98 - 111 mmol/L 94  101  99   CO2 22 - 32 mmol/L 29  26  30    Calcium 8.9 - 10.3 mg/dL 8.7  9.4  8.6   Total Protein 6.5 - 8.1 g/dL 7.0     Total Bilirubin 0.3 - 1.2 mg/dL 0.7     Alkaline Phos 38 - 126 U/L 81     AST 15 - 41 U/L 29     ALT 0 - 44 U/L 20        No results found for: "CEA1", "CEA" / No results found for: "CEA1", "CEA" No results found for: "PSA1" No results found for: "ZOX096" No results found for: "CAN125"  No results found for: "TOTALPROTELP", "ALBUMINELP", "A1GS", "A2GS", "BETS", "BETA2SER", "GAMS", "MSPIKE", "SPEI" No results found for: "TIBC", "FERRITIN", "IRONPCTSAT" Lab Results  Component Value Date   LDH 200 (H) 06/23/2022     STUDIES:   DG Chest 2 View  Result Date: 08/17/2022 CLINICAL DATA:  Shortness of breath.  Chest pain. EXAM: CHEST - 2 VIEW COMPARISON:  Chest radiographs 06/23/2022 and 02/03/2022 FINDINGS: New right chest wall porta catheter tip overlies the central superior vena cava. Status post median sternotomy and aortic valve prosthesis. Left atrial appendage clip again noted. Cardiac silhouette and mediastinal contours are within normal limits. The lungs are clear. No pleural effusion or pneumothorax. Moderate-to-severe approximate L2 vertebral body  height loss is unchanged. IMPRESSION: 1. No active cardiopulmonary disease. 2. New right chest wall porta catheter tip overlies the central superior vena cava. Electronically Signed   By: Neita Garnet M.D.   On: 08/17/2022 15:31   IR IMAGING GUIDED PORT INSERTION  Result Date: 08/10/2022 INDICATION: 62 year old female with new diagnosis of follicular cell lymphoma. She presents for port catheter placement to establish durable venous access. EXAM: IMPLANTED PORT A CATH PLACEMENT WITH ULTRASOUND AND FLUOROSCOPIC GUIDANCE MEDICATIONS: None. ANESTHESIA/SEDATION: Versed 5 mg IV; Fentanyl 200 mcg IV; Moderate Sedation Time:  48 minutes The patient's vital signs and level of consciousness were continuously monitored during the procedure by the interventional radiology nurse under my direct supervision. FLUOROSCOPY: Radiation exposure index: 1 mGy reference air kerma COMPLICATIONS: None immediate. PROCEDURE: The right neck and chest was prepped with chlorhexidine, and draped in the usual sterile fashion using maximum barrier technique (cap and mask, sterile gown, sterile gloves, large sterile sheet, hand hygiene and cutaneous antiseptic). Local anesthesia was attained by infiltration with 1% lidocaine with epinephrine. Ultrasound demonstrated patency of the right internal jugular vein, and this was documented with an image. Under real-time ultrasound guidance, this vein was accessed with a 21 gauge micropuncture needle and image documentation was performed. A small dermatotomy was made at the access site with an 11 scalpel. A 0.018" wire was advanced into the SVC and the access needle exchanged for a 96F micropuncture vascular sheath. The 0.018" wire was then removed and a 0.035" wire advanced into the IVC. An appropriate location for the subcutaneous reservoir was selected below the clavicle and an incision was made through the skin and underlying soft tissues. The subcutaneous tissues were then dissected using a  combination of blunt and sharp surgical technique and a pocket was formed. A single lumen power injectable portacatheter was then tunneled through the subcutaneous tissues from the pocket to the dermatotomy and the port reservoir placed within the subcutaneous pocket. The venous access site was then serially dilated and a  peel away vascular sheath placed over the wire. The wire was removed and the port catheter advanced into position under fluoroscopic guidance. The catheter tip is positioned in the superior cavoatrial junction. This was documented with a spot image. The portacatheter was then tested and found to flush and aspirate well. The port was flushed with saline followed by 100 units/mL heparinized saline. The pocket was then closed in two layers using first subdermal inverted interrupted absorbable sutures followed by a running subcuticular suture. The epidermis was then sealed with Dermabond. The dermatotomy at the venous access site was also closed with Dermabond. IMPRESSION: Successful placement of a right IJ approach Power Port with ultrasound and fluoroscopic guidance. The catheter is ready for use. Electronically Signed   By: Malachy Moan M.D.   On: 08/10/2022 10:13   IR BONE MARROW BIOPSY & ASPIRATION  Result Date: 08/10/2022 INDICATION: New diagnosis of follicular lymphoma. Bone marrow biopsy is requested to facilitate staging. EXAM: FLUORO GUIDED BONE MARROW ASPIRATION AND CORE BIOPSY Interventional Radiologist:  Sterling Big, MD MEDICATIONS: None. ANESTHESIA/SEDATION: See port catheter report. FLUOROSCOPY: Radiation exposure index: 69 mGy reference air kerma COMPLICATIONS: None immediate. Estimated blood loss: <25 mL PROCEDURE: Informed written consent was obtained from the patient after a thorough discussion of the procedural risks, benefits and alternatives. All questions were addressed. Maximal Sterile Barrier Technique was utilized including caps, mask, sterile gowns, sterile  gloves, sterile drape, hand hygiene and skin antiseptic. A timeout was performed prior to the initiation of the procedure. The patient was positioned prone and fluoroscopy was performed of the pelvis to demonstrate the iliac marrow spaces. Additionally, cone beam CT imaging was performed with independent reconstruction manipulation on the 3D workstation during the course of the procedure. Maximal barrier sterile technique utilized including caps, mask, sterile gowns, sterile gloves, large sterile drape, hand hygiene, and betadine prep. Under sterile conditions and local anesthesia, an 11 gauge coaxial bone biopsy needle was advanced into the left iliac marrow space. Needle position was confirmed with fluoroscopy and cone beam CT imaging. Initially, bone marrow aspiration was performed. Next, the 11 gauge outer cannula was utilized to obtain a left iliac bone marrow core biopsy. Needle was removed. Hemostasis was obtained with compression. The patient tolerated the procedure well. Samples were prepared with the cytotechnologist. IMPRESSION: Technically successful image guided left iliac bone marrow aspiration and core biopsy. Electronically Signed   By: Malachy Moan M.D.   On: 08/10/2022 10:13

## 2022-09-08 NOTE — Progress Notes (Signed)
Treatment given today per MD orders.  Stable during infusion without adverse affects.  Vital signs stable.  No complaints at this time.  Discharge from clinic ambulatory in stable condition.  Alert and oriented X 3.  Follow up with [facility] as scheduled.  

## 2022-09-08 NOTE — Patient Instructions (Signed)
[facility]  Discharge Instructions: Thank you for choosing [facility] to provide your oncology and hematology care.  If you have a lab appointment with the Cancer Center - please note that after April 8th, 2024, all labs will be drawn in the cancer center.  You do not have to check in or register with the main entrance as you have in the past but will complete your check-in in the cancer center.  Wear comfortable clothing and clothing appropriate for easy access to any Portacath or PICC line.   We strive to give you quality time with your provider. You may need to reschedule your appointment if you arrive late (15 or more minutes).  Arriving late affects you and other patients whose appointments are after yours.  Also, if you miss three or more appointments without notifying the office, you may be dismissed from the clinic at the provider's discretion.      For prescription refill requests, have your pharmacy contact our office and allow 72 hours for refills to be completed.    Today you received the following chemotherapy and/or immunotherapy agents rituxan/bendeka      To help prevent nausea and vomiting after your treatment, we encourage you to take your nausea medication as directed.  BELOW ARE SYMPTOMS THAT SHOULD BE REPORTED IMMEDIATELY: *FEVER GREATER THAN 100.4 F (38 C) OR HIGHER *CHILLS OR SWEATING *NAUSEA AND VOMITING THAT IS NOT CONTROLLED WITH YOUR NAUSEA MEDICATION *UNUSUAL SHORTNESS OF BREATH *UNUSUAL BRUISING OR BLEEDING *URINARY PROBLEMS (pain or burning when urinating, or frequent urination) *BOWEL PROBLEMS (unusual diarrhea, constipation, pain near the anus) TENDERNESS IN MOUTH AND THROAT WITH OR WITHOUT PRESENCE OF ULCERS (sore throat, sores in mouth, or a toothache) UNUSUAL RASH, SWELLING OR PAIN  UNUSUAL VAGINAL DISCHARGE OR ITCHING   Items with * indicate a potential emergency and should be followed up as soon as possible or go to  the Emergency Department if any problems should occur.  Please show the CHEMOTHERAPY ALERT CARD or IMMUNOTHERAPY ALERT CARD at check-in to the Emergency Department and triage nurse.  Should you have questions after your visit or need to cancel or reschedule your appointment, please contact [facility] [phone]  and follow the prompts.  Office hours are 8:00 a.m. to 4:30 p.m. Monday - Friday. Please note that voicemails left after 4:00 p.m. may not be returned until the following business day.  We are closed weekends and major holidays. You have access to a nurse at all times for urgent questions. Please call the main number to the clinic [phone] and follow the prompts.  For any non-urgent questions, you may also contact your provider using MyChart. We now offer e-Visits for anyone 3 and older to request care online for non-urgent symptoms. For details visit [email or web address].   Also download the MyChart app! Go to the app store, search "MyChart", open the app, select [place], and log in with your MyChart username and password.

## 2022-09-08 NOTE — Patient Instructions (Signed)
[facility] Discharge Instructions   You were seen and examined today by Dr. Ellin Saba.  He reviewed the results of your lab work which are mostly normal/stable. Your potassium is low today at 2.7. We will give you potassium supplementation in the clinic today.   We will proceed with your treatment today.   Return as scheduled.    Thank you for choosing [facility] at [facility] to provide your oncology and hematology care.  To afford each patient quality time with our provider, please arrive at least 15 minutes before your scheduled appointment time.   If you have a lab appointment with the Cancer Center please come in thru the Main Entrance and check in at the main information desk.  You need to re-schedule your appointment should you arrive 10 or more minutes late.  We strive to give you quality time with our providers, and arriving late affects you and other patients whose appointments are after yours.  Also, if you no show three or more times for appointments you may be dismissed from the clinic at the providers discretion.     Again, thank you for choosing [facility].  Our hope is that these requests will decrease the amount of time that you wait before being seen by our physicians.       _____________________________________________________________  Should you have questions after your visit to [facility], please contact our office at [phone] and follow the prompts.  Our office hours are 8:00 a.m. and 4:30 p.m. Monday - Friday.  Please note that voicemails left after 4:00 p.m. may not be returned until the following business day.  We are closed weekends and major holidays.  You do have access to a nurse 24-7, just call the main number to the clinic [phone] and do not press any options, hold on the line and a nurse will answer the phone.    For prescription refill requests, have your  pharmacy contact our office and allow 72 hours.    Due to Covid, you will need to wear a mask upon entering the hospital. If you do not have a mask, a mask will be given to you at the Main Entrance upon arrival. For doctor visits, patients may have 1 support person age 13 or older with them. For treatment visits, patients can not have anyone with them due to social distancing guidelines and our immunocompromised population.

## 2022-09-08 NOTE — Progress Notes (Signed)
Patient presents today for treatment and follow up visit with Dr. Ellin Saba. Labs within parameters for treatment. Potassium 2.7. Standing orders followed. Vital signs within parameters for treatment.   Message received from Dr. Ellin Saba / A.Anderson RN to proceed with treatment. Per A. Anderson RN, patient will start with dose reduced Bendamustine.

## 2022-09-08 NOTE — Progress Notes (Signed)
Orders received for patient to have applied in clinic lidocaine 5% patch topically to skin x 1.  Fredda Hammed, PharmD

## 2022-09-08 NOTE — Progress Notes (Signed)
Patient has been examined by Dr. Ellin Saba. Vital signs and labs have been reviewed by MD - ANC, Creatinine, LFTs, hemoglobin, and platelets are within treatment parameters per M.D. - pt may proceed with treatment.  Potassium to be given per standing orders. Primary RN and pharmacy notified.

## 2022-09-09 ENCOUNTER — Inpatient Hospital Stay: Payer: Medicaid Other

## 2022-09-09 VITALS — BP 153/67 | HR 60 | Temp 98.0°F | Resp 18

## 2022-09-09 DIAGNOSIS — Z5112 Encounter for antineoplastic immunotherapy: Secondary | ICD-10-CM | POA: Diagnosis not present

## 2022-09-09 DIAGNOSIS — Z95828 Presence of other vascular implants and grafts: Secondary | ICD-10-CM

## 2022-09-09 DIAGNOSIS — C8208 Follicular lymphoma grade I, lymph nodes of multiple sites: Secondary | ICD-10-CM

## 2022-09-09 MED ORDER — SODIUM CHLORIDE 0.9 % IV SOLN
60.0000 mg/m2 | Freq: Once | INTRAVENOUS | Status: AC
  Administered 2022-09-09: 87.5 mg via INTRAVENOUS
  Filled 2022-09-09: qty 3.5

## 2022-09-09 MED ORDER — HEPARIN SOD (PORK) LOCK FLUSH 100 UNIT/ML IV SOLN
500.0000 [IU] | Freq: Once | INTRAVENOUS | Status: AC | PRN
  Administered 2022-09-09: 500 [IU]

## 2022-09-09 MED ORDER — SODIUM CHLORIDE 0.9 % IV SOLN: Freq: Once | INTRAVENOUS | Status: AC

## 2022-09-09 MED ORDER — SODIUM CHLORIDE 0.9 % IV SOLN
10.0000 mg | Freq: Once | INTRAVENOUS | Status: AC
  Administered 2022-09-09: 10 mg via INTRAVENOUS
  Filled 2022-09-09: qty 10

## 2022-09-09 MED ORDER — SODIUM CHLORIDE 0.9% FLUSH
10.0000 mL | INTRAVENOUS | Status: DC | PRN
  Administered 2022-09-09: 10 mL

## 2022-09-09 NOTE — Telephone Encounter (Signed)
Please advise 

## 2022-09-09 NOTE — Progress Notes (Signed)
Pt presents today for D2 Bendamustine per provider's order. Vitals sign stable and pt voiced no new complaints at this time.  D2 Bendamustine given today per MD orders. Tolerated infusion without adverse affects. Vital signs stable. No complaints at this time. Discharged from clinic via wheelchair in stable condition. Alert and oriented x 3. F/U with [facility] as scheduled.

## 2022-09-10 ENCOUNTER — Other Ambulatory Visit: Payer: Self-pay | Admitting: *Deleted

## 2022-09-10 DIAGNOSIS — R112 Nausea with vomiting, unspecified: Secondary | ICD-10-CM

## 2022-09-10 MED ORDER — TRANSDERM-SCOP 1 MG/3DAYS TD PT72: 1.0000 | MEDICATED_PATCH | TRANSDERMAL | 12 refills | Status: DC

## 2022-09-10 NOTE — Progress Notes (Signed)
24 hour call back: Left message on mobile cell phone at [phone]. Unable to reach patient. Instructed patient to call clinic with any issues pertaining to symptom management.

## 2022-09-11 ENCOUNTER — Inpatient Hospital Stay: Payer: Medicaid Other

## 2022-09-11 ENCOUNTER — Ambulatory Visit (INDEPENDENT_AMBULATORY_CARE_PROVIDER_SITE_OTHER): Payer: Medicaid Other

## 2022-09-11 ENCOUNTER — Ambulatory Visit
Admission: RE | Admit: 2022-09-11 | Discharge: 2022-09-11 | Disposition: A | Discharge status: Home / Self Care | Payer: Medicaid Other | Source: Ambulatory Visit | Attending: Internal Medicine | Admitting: Internal Medicine

## 2022-09-11 VITALS — BP 162/74 | HR 84 | Temp 97.4°F | Resp 18

## 2022-09-11 VITALS — BP 136/75 | HR 88 | Temp 98.4°F | Resp 20

## 2022-09-11 DIAGNOSIS — M546 Pain in thoracic spine: Secondary | ICD-10-CM

## 2022-09-11 DIAGNOSIS — S32050A Wedge compression fracture of fifth lumbar vertebra, initial encounter for closed fracture: Secondary | ICD-10-CM

## 2022-09-11 DIAGNOSIS — Z95828 Presence of other vascular implants and grafts: Secondary | ICD-10-CM

## 2022-09-11 DIAGNOSIS — Z5112 Encounter for antineoplastic immunotherapy: Secondary | ICD-10-CM | POA: Diagnosis not present

## 2022-09-11 DIAGNOSIS — C8208 Follicular lymphoma grade I, lymph nodes of multiple sites: Secondary | ICD-10-CM

## 2022-09-11 MED ORDER — PEGFILGRASTIM-FPGK 6 MG/0.6ML ~~LOC~~ SOSY
6.0000 mg | PREFILLED_SYRINGE | Freq: Once | SUBCUTANEOUS | Status: AC
  Administered 2022-09-11: 6 mg via SUBCUTANEOUS
  Filled 2022-09-11: qty 0.6

## 2022-09-11 NOTE — ED Provider Notes (Signed)
[facility]    CSN: [id number] Arrival date & time: 09/11/22  1347      History   Chief Complaint Chief Complaint  Patient presents with   Back Pain    Pain from a compression fracture that has suddenly gotten much worse - Entered by patient    HPI Mallory Sutton is a 62 y.o. female with hx of lymphoma, presents with her daughter due to developing severe R thoracic rib pain x 6 days. Lidoderm patch and her Noco are not helping and she can't walk due to the pain being so severe. Has hx of thoracic scoliosis and upper lumbar spine compression fractures which goes into flairs of pain, but normally resolves. She is unable to see her PCP today so came here. Denies fever and has not been sick with respiratory illness. Has bloating from all the nodes she has in her body.   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   Adenopathy 06/24/2022   Generalized lymphadenopathy 06/23/2022   Chronic respiratory failure with hypoxia (HCC) 06/23/2022   Hearing loss 06/03/2022   Numbness    Hypocalcemia    GERD (gastroesophageal reflux disease) 11/09/2021   Leukocytosis 11/09/2021   Protein-calorie malnutrition, severe (HCC) 07/19/2021   Acute respiratory failure with hypoxia (HCC) 07/17/2021   Hypokalemia 07/17/2021   Anxiety and depression 07/17/2021   Chronic diastolic CHF (congestive heart  failure) (HCC) 07/17/2021   COPD (chronic obstructive pulmonary disease) (HCC) 07/16/2021   Vasculitis (HCC) 12/31/2020   Bilateral hand pain 12/31/2020   Benign skin lesion of thigh 10/15/2020   Atrial flutter (HCC)    Acute diastolic heart failure (HCC) 08/23/2014   Pleural effusion 08/23/2014   Pleural effusion, bilateral 08/23/2014   Dyspnea    Acute respiratory failure (HCC) 08/22/2014   Atrial fibrillation with RVR (HCC) 08/06/2014   Encounter for therapeutic drug monitoring 08/06/2014   S/P aortic valve replacement-07/25/14 at [facility] 08/06/2014   Shortness of breath 04/23/2010   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 07/25/14 [facility] 03/12/2010   Aortic valve disorder 09/12/2009   ENDOMETRIOSIS 09/12/2009   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 09/12/2009   Paroxysmal atrial fibrillation (HCC) 07/18/2008   BICUSPID AORTIC VALVE 07/18/2008    Past Surgical History:  Procedure Laterality Date   CARDIOVERSION N/A 08/29/2019   Procedure: CARDIOVERSION;  Surgeon: Antoine Poche, MD;  Location: [facility];  Service: Endoscopy;  Laterality: N/A;   CARDIOVERSION N/A 07/16/2022   Procedure: CARDIOVERSION;  Surgeon: Sande Rives, MD;  Location: [facility] ENDOSCOPY;  Service: Cardiovascular;  Laterality: N/A;   coarctation repair and residual restenosis     IR BONE MARROW BIOPSY & ASPIRATION  08/10/2022   IR IMAGING GUIDED PORT INSERTION  08/10/2022   KNEE SURGERY  LYMPH NODE BIOPSY Right 06/25/2022   Procedure: LYMPH NODE BIOPSY; right groin;  Surgeon: Lucretia Roers, MD;  Location: [facility];  Service: General;  Laterality: Right;   PORTA CATH INSERTION     TEE WITHOUT CARDIOVERSION N/A 07/12/2019   Procedure: TRANSESOPHAGEAL ECHOCARDIOGRAM (TEE);  Surgeon: Quintella Reichert, MD;  Location: [facility] ENDOSCOPY;  Service: Cardiovascular;  Laterality: N/A;   TEE WITHOUT CARDIOVERSION N/A 08/29/2019   Procedure: TRANSESOPHAGEAL ECHOCARDIOGRAM (TEE) WITH  PROPOFOL;  Surgeon: Antoine Poche, MD;  Location: [facility];  Service: Endoscopy;  Laterality: N/A;    OB History   No obstetric history on file.      Home Medications    Prior to Admission medications   Medication Sig Start Date End Date Taking? Authorizing Provider  scopolamine (TRANSDERM-SCOP) 1 MG/3DAYS Place 1 patch (1.5 mg total) onto the skin every 3 (three) days. 09/10/22   Doreatha Massed, MD  acetaminophen (TYLENOL) 325 MG tablet Take 2 tablets (650 mg total) by mouth every 6 (six) hours as needed for mild pain (or Fever >/= 101). 06/27/22   Emokpae, Courage, MD  albuterol (VENTOLIN HFA) 108 (90 Base) MCG/ACT inhaler Inhale 2 puffs into the lungs every 4 (four) hours as needed for wheezing or shortness of breath. 06/27/22   Shon Hale, MD  allopurinol (ZYLOPRIM) 300 MG tablet Take 1 tablet (300 mg total) by mouth daily. 08/31/22   Doreatha Massed, MD  ALPRAZolam Prudy Feeler) 0.5 MG tablet Take 0.5 mg by mouth 3 (three) times daily. 07/01/22   [provider]  aspirin EC 81 MG tablet Take 1 tablet (81 mg total) by mouth daily with breakfast. 06/27/22   Mariea Clonts, Courage, MD  BENDAMUSTINE HCL IV Inject into the vein every 28 (twenty-eight) days. Days 1&2 every 28 days 09/08/22   [provider]  Budeson-Glycopyrrol-Formoterol (BREZTRI AEROSPHERE) 160-9-4.8 MCG/ACT AERO Inhale 2 puffs into the lungs 2 (two) times daily. 06/27/22   Shon Hale, MD  calcium-vitamin D (OSCAL WITH D) 500-5 MG-MCG tablet Take 1 tablet by mouth daily. 11/10/21   Vassie Loll, MD  dofetilide (TIKOSYN) 250 MCG capsule Take 1 capsule (250 mcg total) by mouth 2 (two) times daily. 08/12/22   Graciella Freer, PA-C  feeding supplement (ENSURE ENLIVE / ENSURE PLUS) LIQD Take 237 mLs by mouth 2 (two) times daily between meals. 11/10/21   Vassie Loll, MD  furosemide (LASIX) 40 MG tablet Take 2 tablets (80 mg total) by mouth daily. 06/27/22   Shon Hale, MD  gabapentin  (NEURONTIN) 300 MG capsule Take 300-600 mg by mouth See admin instructions. Take 300 mg tablet by mouth in the morning, 300 mg tablet by mouth in the afternoon, and then take two of the 300 mg tablets by mouth for total of 600 mg in the evening per patient    [provider]  HYDROcodone-acetaminophen (NORCO/VICODIN) 5-325 MG tablet Take 2 tablets by mouth 2 (two) times daily. 10/31/20   [provider]  ipratropium-albuterol (DUONEB) 0.5-2.5 (3) MG/3ML SOLN Inhale 3 mLs into the lungs 2 (two) times daily as needed (shortness of breath). 06/27/22   Shon Hale, MD  lidocaine-prilocaine (EMLA) cream Apply a small amount to port a cath site and cover with plastic wrap 1 hour prior to infusion appointments 08/31/22   Doreatha Massed, MD  magnesium oxide (MAGOX 400) 400 (240 Mg) MG tablet Take 1 tablet (400 mg total) by mouth daily. 02/06/22   Croitoru, Mihai, MD  metoprolol succinate (TOPROL-XL) 25 MG 24 hr tablet  Take 1 tablet (25 mg total) by mouth daily. 08/17/22   Terrilee Files, MD  metoprolol succinate (TOPROL-XL) 50 MG 24 hr tablet Take 1 tablet (50 mg total) by mouth daily. Take with or immediately following a meal. 06/29/22 06/24/23  Wendall Stade, MD  Multiple Vitamin (MULTI-VITAMINS) TABS Take 1 tablet by mouth daily.     [provider]  pantoprazole (PROTONIX) 40 MG tablet Take 1 tablet (40 mg total) by mouth daily. 06/28/22   Shon Hale, MD  potassium chloride SA (KLOR-CON M) 20 MEQ tablet Take 2 tablets (40 mEq total) by mouth 2 (two) times daily. 07/29/22   Graciella Freer, PA-C  predniSONE (DELTASONE) 20 MG tablet Take 1 tablet (20 mg total) by mouth daily with breakfast. 04/29/22   Wendall Stade, MD  promethazine (PHENERGAN) 25 MG tablet Take 1 tablet (25 mg total) by mouth every 6 (six) hours as needed for nausea or vomiting. 09/08/22   Doreatha Massed, MD  riTUXimab (RITUXAN IV) Inject into the vein every 28 (twenty-eight) days. 09/08/22    [provider]  venlafaxine (EFFEXOR) 75 MG tablet Take 75 mg by mouth 2 (two) times daily.    [provider]  warfarin (COUMADIN) 1 MG tablet Take 3 tablets daily except 4 tablets on Fridays or as directed by coumadin clinic Patient taking differently: Take 1-4 mg by mouth See admin instructions. Take 3 tablets every day except on Fridays take four of the 1 mg tablets to make full dose of 4 mg or as directed by coumadin clinic 06/23/22   Wendall Stade, MD  zolpidem (AMBIEN) 10 MG tablet Take 1 tablet (10 mg total) by mouth at bedtime as needed for sleep. Patient taking differently: Take 5 mg by mouth at bedtime as needed for sleep. 06/27/22 07/27/22  Shon Hale, MD    Family History Family History  Problem Relation Age of Onset   Suicidality Mother    Mental illness Mother    Drug abuse Brother    Healthy Daughter    Healthy Son     Social History Social History   Tobacco Use   Smoking status: Never    Passive exposure: Past   Smokeless tobacco: Never   Tobacco comments:    Never smoke 07/14/22  Vaping Use   Vaping Use: Never used  Substance Use Topics   Alcohol use: Yes    Comment: 3 beers 3 nights a week 07/14/22   Drug use: No     Allergies   Amiodarone and Penicillins   Review of Systems Review of Systems As noted in HPI  Physical Exam Triage Vital Signs ED Triage Vitals  Enc Vitals Group     BP 09/11/22 1426 136/75     Pulse Rate 09/11/22 1426 88     Resp 09/11/22 1426 20     Temp 09/11/22 1426 98.4 F (36.9 C)     Temp Source 09/11/22 1426 Oral     SpO2 09/11/22 1426 98 %     Weight --      Height --      Head Circumference --      Peak Flow --      Pain Score 09/11/22 1428 10     Pain Loc --      Pain Edu? --      Excl. in GC? --    No data found.  Updated Vital Signs BP 136/75 (BP Location: Left Arm)   Pulse 88  Temp 98.4 F (36.9 C) (Oral)   Resp 20   LMP 07/16/2013   SpO2 98%   Visual Acuity Right Eye  Distance:   Left Eye Distance:   Bilateral Distance:    Right Eye Near:   Left Eye Near:    Bilateral Near:     Physical Exam Vitals and nursing note reviewed.  Constitutional:      Appearance: She is ill-appearing.     Comments: In moderate pain and sitting on a wheelchair  HENT:     Right Ear: External ear normal.     Left Ear: External ear normal.  Eyes:     General: No scleral icterus.    Conjunctiva/sclera: Conjunctivae normal.  Pulmonary:     Effort: Pulmonary effort is normal.     Breath sounds: Normal breath sounds.     Comments: She is wearing O2 Chest:     Chest wall: No tenderness.  Musculoskeletal:     Cervical back: Neck supple.     Comments: BACK - with thoracic scoliosis, R lower thoracic rib region is more prominent and pt feels this is new, and is painful to palpation, but I could not provoke pain on lower spine region. Spine movement provoked her pain.   Skin:    General: Skin is warm and dry.     Findings: No rash.  Neurological:     Mental Status: She is alert and oriented to person, place, and time.  Psychiatric:        Mood and Affect: Mood normal.        Behavior: Behavior normal.        Thought Content: Thought content normal.      UC Treatments / Results  Labs (all labs ordered are listed, but only abnormal results are displayed) Labs Reviewed - No data to display  EKG   Radiology DG Lumbar Spine Complete  Result Date: 09/11/2022 CLINICAL DATA:  Back pain for 6 days. Reports history of compression fracture. EXAM: LUMBAR SPINE - COMPLETE 4+ VIEW COMPARISON:  Whole-body PET-CT 06/18/2022. FINDINGS: There are 5 non-rib-bearing lumbar-type vertebral bodies. There is significant dextrocurvature centered at L1, similar to the prior PET-CT. There is compression deformity of the L1 vertebral body with up to approximately 50% loss of vertebral body height which is similar to the prior PET-CT and radiograph from 01/14/2021. There is compression  deformity of the L5 vertebral body which appears new since the prior PET-CT and could be acute. There is trace retrolisthesis of L1 on L2. There is no other significant antero or retrolisthesis. There is no evidence of spondylolysis. There is overall mild multilevel degenerative change. The SI joints are intact. The soft tissues are unremarkable. IMPRESSION: 1. Compression deformity of the L5 vertebral body appears new since the prior PET-CT from February and could be acute. Consider CT or MRI for further evaluation. 2. Unchanged compression deformity of the L1 vertebral body. 3. 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 Otherwise overall mild degenerative changes. Electronically Signed   By: Lesia Hausen M.D.   On: 09/11/2022 16:00   DG Ribs Unilateral W/Chest Right  Result Date: 09/11/2022 CLINICAL DATA:  Right posterior rib pain and thoracic pain for 6 days. EXAM: RIGHT RIBS AND CHEST - 3+ VIEW COMPARISON:  Chest radiograph 08/17/2022. 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 The atrial appendage clipping device is stable. The cardiomediastinal silhouette is stable. There is no focal consolidation or pulmonary edema. There is  no pleural effusion or pneumothorax No displaced rib fracture or other acute osseous abnormality is identified. IMPRESSION: No displaced rib fracture or other acute osseous abnormality identified. Electronically Signed   By: Lesia Hausen M.D.   On: 09/11/2022 15:55    Procedures Procedures (including critical care time)  Medications Ordered in UC Medications - No data to display  Initial Impression / Assessment and Plan / UC Course  I have reviewed the triage vital signs and the nursing notes.  Pertinent  imaging results that were available during my care of the patient were reviewed by me and considered in my medical decision making (see chart for details).  Possible L5 compression fracture  She was sent to ER to  have CT done for confirmation. Also there is nothing for pain we can give her here since she is on Cumadin and all we have is Toradol.    Final Clinical Impressions(s) / UC Diagnoses   Final diagnoses:  Acute right-sided thoracic back pain  Closed compression fracture of L5 vertebra, initial encounter [facility])     Discharge Instructions      The back xray shows possible new compression fracture of L5 vertebrae area, but the radiologist advises to get CT or MRI for confirmation, so go to the ER right now to have that done. The rib xray shows normal ribs and lungs.    COMPARISON:  Whole-body PET-CT 06/18/2022.   FINDINGS: There are 5 non-rib-bearing lumbar-type vertebral bodies. There is significant dextrocurvature centered at L1, similar to the prior PET-CT. There is compression deformity of the L1 vertebral body with up to approximately 50% loss of vertebral body height which is similar to the prior PET-CT and radiograph from 01/14/2021.   There is compression deformity of the L5 vertebral body which appears new since the prior PET-CT and could be acute.   There is trace retrolisthesis of L1 on L2. There is no other significant antero or retrolisthesis. There is no evidence of spondylolysis.   There is overall mild multilevel degenerative change. The SI joints are intact. The soft tissues are unremarkable.   IMPRESSION: 1. Compression deformity of the L5 vertebral body appears new since the prior PET-CT from February and could be acute. Consider CT or MRI for further evaluation. 2. Unchanged compression deformity of the L1 vertebral body. 3. 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 Otherwise overall mild degenerative changes.     ED Prescriptions   None    PDMP not reviewed this encounter.   Garey Ham, [place] 09/11/22 1617

## 2022-09-11 NOTE — Progress Notes (Signed)
Patient tolerated Stimufend injection with no complaints voiced.  Site clean and dry with no bruising or swelling noted. Patient c/o severe back pain on arrival and states that is why she was late for her appointment but will be going directly to Urgent Care. Discharged with vital signs stable and with daughter at side.

## 2022-09-11 NOTE — Discharge Instructions (Addendum)
The back xray shows possible new compression fracture of L5 vertebrae area, but the radiologist advises to get CT or MRI for confirmation, so go to the ER right now to have that done. The rib xray shows normal ribs and lungs.    COMPARISON:  Whole-body PET-CT 06/18/2022.   FINDINGS: There are 5 non-rib-bearing lumbar-type vertebral bodies. There is significant dextrocurvature centered at L1, similar to the prior PET-CT. There is compression deformity of the L1 vertebral body with up to approximately 50% loss of vertebral body height which is similar to the prior PET-CT and radiograph from 01/14/2021.   There is compression deformity of the L5 vertebral body which appears new since the prior PET-CT and could be acute.   There is trace retrolisthesis of L1 on L2. There is no other significant antero or retrolisthesis. There is no evidence of spondylolysis.   There is overall mild multilevel degenerative change. The SI joints are intact. The soft tissues are unremarkable.   IMPRESSION: 1. Compression deformity of the L5 vertebral body appears new since the prior PET-CT from February and could be acute. Consider CT or MRI for further evaluation. 2. Unchanged compression deformity of the L1 vertebral body. 3. 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 Otherwise overall mild degenerative changes.

## 2022-09-11 NOTE — Patient Instructions (Signed)
[facility]  Discharge Instructions: Thank you for choosing [facility] to provide your oncology and hematology care.  If you have a lab appointment with the Cancer Center - please note that after April 8th, 2024, all labs will be drawn in the cancer center.  You do not have to check in or register with the main entrance as you have in the past but will complete your check-in in the cancer center.  Wear comfortable clothing and clothing appropriate for easy access to any Portacath or PICC line.   We strive to give you quality time with your provider. You may need to reschedule your appointment if you arrive late (15 or more minutes).  Arriving late affects you and other patients whose appointments are after yours.  Also, if you miss three or more appointments without notifying the office, you may be dismissed from the clinic at the provider's discretion.      For prescription refill requests, have your pharmacy contact our office and allow 72 hours for refills to be completed.    Today you received the following Stimufend.  Pegfilgrastim Injection What is this medication? PEGFILGRASTIM (PEG fil gra stim) lowers the risk of infection in people who are receiving chemotherapy. It works by Systems analyst make more white blood cells, which protects your body from infection. It may also be used to help people who have been exposed to high doses of radiation. This medicine may be used for other purposes; ask your health care provider or pharmacist if you have questions. COMMON BRAND NAME(S): Cherly Hensen, Neulasta, Nyvepria, Stimufend, UDENYCA, Ziextenzo What should I tell my care team before I take this medication? They need to know if you have any of these conditions: Kidney disease Latex allergy Ongoing radiation therapy Sickle cell disease Skin reactions to acrylic adhesives (On-Body Injector only) An unusual or allergic reaction to pegfilgrastim,  filgrastim, other medications, foods, dyes, or preservatives Pregnant or trying to get pregnant Breast-feeding How should I use this medication? This medication is for injection under the skin. If you get this medication at home, you will be taught how to prepare and give the pre-filled syringe or how to use the On-body Injector. Refer to the patient Instructions for Use for detailed instructions. Use exactly as directed. Tell your care team immediately if you suspect that the On-body Injector may not have performed as intended or if you suspect the use of the On-body Injector resulted in a missed or partial dose. It is important that you put your used needles and syringes in a special sharps container. Do not put them in a trash can. If you do not have a sharps container, call your pharmacist or care team to get one. Talk to your care team about the use of this medication in children. While this medication may be prescribed for selected conditions, precautions do apply. Overdosage: If you think you have taken too much of this medicine contact a poison control center or emergency room at once. NOTE: This medicine is only for you. Do not share this medicine with others. What if I miss a dose? It is important not to miss your dose. Call your care team if you miss your dose. If you miss a dose due to an On-body Injector failure or leakage, a new dose should be administered as soon as possible using a single prefilled syringe for manual use. What may interact with this medication? Interactions have not been studied. This list  may not describe all possible interactions. Give your health care provider a list of all the medicines, herbs, non-prescription drugs, or dietary supplements you use. Also tell them if you smoke, drink alcohol, or use illegal drugs. Some items may interact with your medicine. What should I watch for while using this medication? Your condition will be monitored carefully while you are  receiving this medication. You may need blood work done while you are taking this medication. Talk to your care team about your risk of cancer. You may be more at risk for certain types of cancer if you take this medication. If you are going to need a MRI, CT scan, or other procedure, tell your care team that you are using this medication (On-Body Injector only). What side effects may I notice from receiving this medication? Side effects that you should report to your care team as soon as possible: Allergic reactions--skin rash, itching, hives, swelling of the face, lips, tongue, or throat Capillary leak syndrome--stomach or muscle pain, unusual weakness or fatigue, feeling faint or lightheaded, decrease in the amount of urine, swelling of the ankles, hands, or feet, trouble breathing High white blood cell level--fever, fatigue, trouble breathing, night sweats, change in vision, weight loss Inflammation of the aorta--fever, fatigue, back, chest, or stomach pain, severe headache Kidney injury (glomerulonephritis)--decrease in the amount of urine, red or dark brown urine, foamy or bubbly urine, swelling of the ankles, hands, or feet Shortness of breath or trouble breathing Spleen injury--pain in upper left stomach or shoulder Unusual bruising or bleeding Side effects that usually do not require medical attention (report to your care team if they continue or are bothersome): Bone pain Pain in the hands or feet This list may not describe all possible side effects. Call your doctor for medical advice about side effects. You may report side effects to FDA at 1-800-FDA-1088. Where should I keep my medication? Keep out of the reach of children. If you are using this medication at home, you will be instructed on how to store it. Throw away any unused medication after the expiration date on the label. NOTE: This sheet is a summary. It may not cover all possible information. If you have questions about  this medicine, talk to your doctor, pharmacist, or health care provider.  2023 Elsevier/Gold Standard (2021-01-03 00:00:00)     To help prevent nausea and vomiting after your treatment, we encourage you to take your nausea medication as directed.  BELOW ARE SYMPTOMS THAT SHOULD BE REPORTED IMMEDIATELY: *FEVER GREATER THAN 100.4 F (38 C) OR HIGHER *CHILLS OR SWEATING *NAUSEA AND VOMITING THAT IS NOT CONTROLLED WITH YOUR NAUSEA MEDICATION *UNUSUAL SHORTNESS OF BREATH *UNUSUAL BRUISING OR BLEEDING *URINARY PROBLEMS (pain or burning when urinating, or frequent urination) *BOWEL PROBLEMS (unusual diarrhea, constipation, pain near the anus) TENDERNESS IN MOUTH AND THROAT WITH OR WITHOUT PRESENCE OF ULCERS (sore throat, sores in mouth, or a toothache) UNUSUAL RASH, SWELLING OR PAIN  UNUSUAL VAGINAL DISCHARGE OR ITCHING   Items with * indicate a potential emergency and should be followed up as soon as possible or go to the Emergency Department if any problems should occur.  Please show the CHEMOTHERAPY ALERT CARD or IMMUNOTHERAPY ALERT CARD at check-in to the Emergency Department and triage nurse.  Should you have questions after your visit or need to cancel or reschedule your appointment, please contact [facility] [phone]  and follow the prompts.  Office hours are 8:00 a.m. to 4:30 p.m. Monday - Friday.  Please note that voicemails left after 4:00 p.m. may not be returned until the following business day.  We are closed weekends and major holidays. You have access to a nurse at all times for urgent questions. Please call the main number to the clinic [phone] and follow the prompts.  For any non-urgent questions, you may also contact your provider using MyChart. We now offer e-Visits for anyone 61 and older to request care online for non-urgent symptoms. For details visit [email or web address].   Also download the MyChart app! Go to the app store, search  "MyChart", open the app, select [place], and log in with your MyChart username and password.

## 2022-09-11 NOTE — ED Triage Notes (Signed)
Pt reports she is having severe back pain  x 6 days. Pt has a compression fracture but in the last 6 days her back pain has worsened. Pt reports no injury  her back just flared up. Did not take anything other than her oxycodone but no relief.   Pt states she has a buldging area on her mid back that is sticking out more than usual.  Pt wants a chest x ray.

## 2022-09-11 NOTE — ED Notes (Signed)
Patient is being discharged from the Urgent Care and sent to the Emergency Department via POV . Per PA, patient is in need of higher level of care due to need for CT confirmation of compression fracture. Patient is aware and verbalizes understanding of plan of care.  Vitals:   09/11/22 1426  BP: 136/75  Pulse: 88  Resp: 20  Temp: 98.4 F (36.9 C)  SpO2: 98%

## 2022-09-12 ENCOUNTER — Emergency Department (HOSPITAL_BASED_OUTPATIENT_CLINIC_OR_DEPARTMENT_OTHER): Payer: Medicaid Other

## 2022-09-12 ENCOUNTER — Encounter (HOSPITAL_BASED_OUTPATIENT_CLINIC_OR_DEPARTMENT_OTHER): Payer: Self-pay | Admitting: Emergency Medicine

## 2022-09-12 ENCOUNTER — Emergency Department (HOSPITAL_BASED_OUTPATIENT_CLINIC_OR_DEPARTMENT_OTHER)
Admission: EM | Admit: 2022-09-12 | Discharge: 2022-09-12 | Disposition: A | Discharge status: Home / Self Care | Payer: Medicaid Other | Attending: Emergency Medicine | Admitting: Emergency Medicine

## 2022-09-12 ENCOUNTER — Other Ambulatory Visit: Payer: Self-pay

## 2022-09-12 DIAGNOSIS — S32050A Wedge compression fracture of fifth lumbar vertebra, initial encounter for closed fracture: Secondary | ICD-10-CM

## 2022-09-12 DIAGNOSIS — S22079A Unspecified fracture of T9-T10 vertebra, initial encounter for closed fracture: Secondary | ICD-10-CM | POA: Diagnosis not present

## 2022-09-12 DIAGNOSIS — I509 Heart failure, unspecified: Secondary | ICD-10-CM | POA: Diagnosis not present

## 2022-09-12 DIAGNOSIS — R0602 Shortness of breath: Secondary | ICD-10-CM | POA: Diagnosis not present

## 2022-09-12 DIAGNOSIS — G893 Neoplasm related pain (acute) (chronic): Secondary | ICD-10-CM | POA: Insufficient documentation

## 2022-09-12 DIAGNOSIS — S32059A Unspecified fracture of fifth lumbar vertebra, initial encounter for closed fracture: Secondary | ICD-10-CM | POA: Insufficient documentation

## 2022-09-12 DIAGNOSIS — R079 Chest pain, unspecified: Secondary | ICD-10-CM | POA: Insufficient documentation

## 2022-09-12 DIAGNOSIS — Z8572 Personal history of non-Hodgkin lymphomas: Secondary | ICD-10-CM | POA: Diagnosis not present

## 2022-09-12 DIAGNOSIS — S22070A Wedge compression fracture of T9-T10 vertebra, initial encounter for closed fracture: Secondary | ICD-10-CM

## 2022-09-12 DIAGNOSIS — Z7982 Long term (current) use of aspirin: Secondary | ICD-10-CM | POA: Diagnosis not present

## 2022-09-12 DIAGNOSIS — X58XXXA Exposure to other specified factors, initial encounter: Secondary | ICD-10-CM | POA: Insufficient documentation

## 2022-09-12 DIAGNOSIS — I4891 Unspecified atrial fibrillation: Secondary | ICD-10-CM | POA: Insufficient documentation

## 2022-09-12 DIAGNOSIS — Z7901 Long term (current) use of anticoagulants: Secondary | ICD-10-CM | POA: Insufficient documentation

## 2022-09-12 DIAGNOSIS — S3992XA Unspecified injury of lower back, initial encounter: Secondary | ICD-10-CM | POA: Diagnosis present

## 2022-09-12 HISTORY — DX: Non-Hodgkin lymphoma, unspecified, unspecified site: C85.90

## 2022-09-12 LAB — COMPREHENSIVE METABOLIC PANEL
ALT: 17 U/L (ref 0–44)
AST: 18 U/L (ref 15–41)
Albumin: 3.8 g/dL (ref 3.5–5.0)
Alkaline Phosphatase: 101 U/L (ref 38–126)
Anion gap: 11 (ref 5–15)
BUN: 19 mg/dL (ref 6–20)
CO2: 33 mmol/L — ABNORMAL HIGH (ref 22–32)
Calcium: 9 mg/dL (ref 8.9–10.3)
Chloride: 95 mmol/L — ABNORMAL LOW (ref 98–111)
Creatinine, Ser: 0.9 mg/dL (ref 0.44–1.00)
GFR, Estimated: >60 mL/min (ref >60)
Glucose, Bld: 125 mg/dL — ABNORMAL HIGH (ref 70–99)
Potassium: 3.3 mmol/L — ABNORMAL LOW (ref 3.5–5.1)
Sodium: 139 mmol/L (ref 135–145)
Total Bilirubin: 0.6 mg/dL (ref 0.3–1.2)
Total Protein: 6.3 g/dL — ABNORMAL LOW (ref 6.5–8.1)

## 2022-09-12 LAB — PROTIME-INR
INR: 3.3 — ABNORMAL HIGH (ref 0.8–1.2)
Prothrombin Time: 33.6 seconds — ABNORMAL HIGH (ref 11.4–15.2)

## 2022-09-12 LAB — CBC WITH DIFFERENTIAL/PLATELET
Abs Immature Granulocytes: 0.6 10*3/uL — ABNORMAL HIGH (ref 0.00–0.07)
Band Neutrophils: 7 %
Basophils Absolute: 0.6 10*3/uL — ABNORMAL HIGH (ref 0.0–0.1)
Basophils Relative: 1 %
Eosinophils Absolute: 0 10*3/uL (ref 0.0–0.5)
Eosinophils Relative: 0 %
HCT: 31.4 % — ABNORMAL LOW (ref 36.0–46.0)
Hemoglobin: 10 g/dL — ABNORMAL LOW (ref 12.0–15.0)
Lymphocytes Relative: 0 %
Lymphs Abs: 0 10*3/uL — ABNORMAL LOW (ref 0.7–4.0)
MCH: 25.8 pg — ABNORMAL LOW (ref 26.0–34.0)
MCHC: 31.8 g/dL (ref 30.0–36.0)
MCV: 81.1 fL (ref 80.0–100.0)
Metamyelocytes Relative: 1 %
Monocytes Absolute: 1.7 10*3/uL — ABNORMAL HIGH (ref 0.1–1.0)
Monocytes Relative: 3 %
Neutro Abs: 52.4 10*3/uL — ABNORMAL HIGH (ref 1.7–7.7)
Neutrophils Relative %: 88 %
Platelets: 343 10*3/uL (ref 150–400)
RBC: 3.87 MIL/uL (ref 3.87–5.11)
RDW: 16.5 % — ABNORMAL HIGH (ref 11.5–15.5)
WBC: 55.2 10*3/uL (ref 4.0–10.5)
nRBC: 0 % (ref 0.0–0.2)

## 2022-09-12 MED ORDER — HYDROMORPHONE HCL 1 MG/ML IJ SOLN
1.0000 mg | Freq: Once | INTRAMUSCULAR | Status: AC
  Administered 2022-09-12: 1 mg via INTRAVENOUS
  Filled 2022-09-12: qty 1

## 2022-09-12 MED ORDER — IOHEXOL 350 MG/ML SOLN
100.0000 mL | Freq: Once | INTRAVENOUS | Status: AC | PRN
  Administered 2022-09-12: 80 mL via INTRAVENOUS

## 2022-09-12 MED ORDER — HEPARIN SOD (PORK) LOCK FLUSH 100 UNIT/ML IV SOLN
500.0000 [IU] | Freq: Once | INTRAVENOUS | Status: AC
  Administered 2022-09-12: 500 [IU]
  Filled 2022-09-12: qty 5

## 2022-09-12 MED ORDER — HYDROMORPHONE HCL 2 MG PO TABS: 2.0000 mg | ORAL_TABLET | ORAL | 0 refills | Status: AC | PRN

## 2022-09-12 NOTE — ED Triage Notes (Signed)
Just started chemo , received wbc injection ( pt has been having pain longer than that).

## 2022-09-12 NOTE — ED Triage Notes (Signed)
Pt was seen yesterday, dx with new lumbar fracture. Pt is in extreme pain ,needs pain management

## 2022-09-12 NOTE — ED Provider Notes (Signed)
[place] EMERGENCY DEPARTMENT AT [facility] Provider Note   CSN: [id number] Arrival date & time: 09/12/22  1336     History  No chief complaint on file.   Mallory Sutton is a 62 y.o. female.  HPI Patient resents with chest pain shortness of breath and back pain.  Recently seen in urgent care for same and found to have new L5 compression fracture.  History of lymphoma and is on chemotherapy which she recently just started.  Has had a history of trouble breathing for a while now.  Predates the lymphoma diagnosis.  Is on oxygen.  States has had pain to but now more severe in the right flank.  Is on Coumadin for A-fib and mechanical valve.  States levels have been low at times.  No fall but found to have L5 compression fracture on x-ray done at urgent care.  Had been sent to ER last night but had too long to wait.  No weakness.  Has oxycodone at home and take some Tylenol at times.  Pain uncontrolled however with this.   Past Medical History:  Diagnosis Date   Aortic stenosis    ATRIAL ARRHYTHMIAS    Bicuspid aortic valve    CHF (congestive heart failure) (HCC)    COARCTATION OF AORTA    Dysrhythmia    ENDOMETRIOSIS    Heart murmur    Lymphoma (HCC)    Paroxysmal atrial fibrillation (HCC)    Port-A-Cath in place 08/31/2022   Shortness of breath    THORACIC AORTIC ANEURYSM    Vasculitis (HCC)     Home Medications Prior to Admission medications   Medication Sig Start Date End Date Taking? Authorizing Provider  HYDROmorphone (DILAUDID) 2 MG tablet Take 1 tablet (2 mg total) by mouth every 4 (four) hours as needed for up to 5 days for severe pain. 09/12/22 09/17/22 Yes Benjiman Core, MD  scopolamine (TRANSDERM-SCOP) 1 MG/3DAYS Place 1 patch (1.5 mg total) onto the skin every 3 (three) days. 09/10/22   Doreatha Massed, MD  acetaminophen (TYLENOL) 325 MG tablet Take 2 tablets (650 mg total) by mouth every 6 (six) hours as needed for mild pain (or Fever >/= 101). 06/27/22    Emokpae, Courage, MD  albuterol (VENTOLIN HFA) 108 (90 Base) MCG/ACT inhaler Inhale 2 puffs into the lungs every 4 (four) hours as needed for wheezing or shortness of breath. 06/27/22   Shon Hale, MD  allopurinol (ZYLOPRIM) 300 MG tablet Take 1 tablet (300 mg total) by mouth daily. 08/31/22   Doreatha Massed, MD  ALPRAZolam Prudy Feeler) 0.5 MG tablet Take 0.5 mg by mouth 3 (three) times daily. 07/01/22   [provider]  aspirin EC 81 MG tablet Take 1 tablet (81 mg total) by mouth daily with breakfast. 06/27/22   Mariea Clonts, Courage, MD  BENDAMUSTINE HCL IV Inject into the vein every 28 (twenty-eight) days. Days 1&2 every 28 days 09/08/22   [provider]  Budeson-Glycopyrrol-Formoterol (BREZTRI AEROSPHERE) 160-9-4.8 MCG/ACT AERO Inhale 2 puffs into the lungs 2 (two) times daily. 06/27/22   Shon Hale, MD  calcium-vitamin D (OSCAL WITH D) 500-5 MG-MCG tablet Take 1 tablet by mouth daily. 11/10/21   Vassie Loll, MD  dofetilide (TIKOSYN) 250 MCG capsule Take 1 capsule (250 mcg total) by mouth 2 (two) times daily. 08/12/22   Graciella Freer, PA-C  feeding supplement (ENSURE ENLIVE / ENSURE PLUS) LIQD Take 237 mLs by mouth 2 (two) times daily between meals. 11/10/21   Vassie Loll, MD  furosemide (LASIX) 40 MG tablet Take 2 tablets (80 mg total) by mouth daily. 06/27/22   Shon Hale, MD  gabapentin (NEURONTIN) 300 MG capsule Take 300-600 mg by mouth See admin instructions. Take 300 mg tablet by mouth in the morning, 300 mg tablet by mouth in the afternoon, and then take two of the 300 mg tablets by mouth for total of 600 mg in the evening per patient    [provider]  HYDROcodone-acetaminophen (NORCO/VICODIN) 5-325 MG tablet Take 2 tablets by mouth 2 (two) times daily. 10/31/20   [provider]  ipratropium-albuterol (DUONEB) 0.5-2.5 (3) MG/3ML SOLN Inhale 3 mLs into the lungs 2 (two) times daily as needed (shortness of breath). 06/27/22   Shon Hale, MD  lidocaine-prilocaine (EMLA) cream Apply a small amount to port a cath site and cover with plastic wrap 1 hour prior to infusion appointments 08/31/22   Doreatha Massed, MD  magnesium oxide (MAGOX 400) 400 (240 Mg) MG tablet Take 1 tablet (400 mg total) by mouth daily. 02/06/22   Croitoru, Mihai, MD  metoprolol succinate (TOPROL-XL) 25 MG 24 hr tablet Take 1 tablet (25 mg total) by mouth daily. 08/17/22   Terrilee Files, MD  metoprolol succinate (TOPROL-XL) 50 MG 24 hr tablet Take 1 tablet (50 mg total) by mouth daily. Take with or immediately following a meal. 06/29/22 06/24/23  Wendall Stade, MD  Multiple Vitamin (MULTI-VITAMINS) TABS Take 1 tablet by mouth daily.     [provider]  pantoprazole (PROTONIX) 40 MG tablet Take 1 tablet (40 mg total) by mouth daily. 06/28/22   Shon Hale, MD  potassium chloride SA (KLOR-CON M) 20 MEQ tablet Take 2 tablets (40 mEq total) by mouth 2 (two) times daily. 07/29/22   Graciella Freer, PA-C  predniSONE (DELTASONE) 20 MG tablet Take 1 tablet (20 mg total) by mouth daily with breakfast. 04/29/22   Wendall Stade, MD  promethazine (PHENERGAN) 25 MG tablet Take 1 tablet (25 mg total) by mouth every 6 (six) hours as needed for nausea or vomiting. 09/08/22   Doreatha Massed, MD  riTUXimab (RITUXAN IV) Inject into the vein every 28 (twenty-eight) days. 09/08/22   [provider]  venlafaxine (EFFEXOR) 75 MG tablet Take 75 mg by mouth 2 (two) times daily.    [provider]  warfarin (COUMADIN) 1 MG tablet Take 3 tablets daily except 4 tablets on Fridays or as directed by coumadin clinic Patient taking differently: Take 1-4 mg by mouth See admin instructions. Take 3 tablets every day except on Fridays take four of the 1 mg tablets to make full dose of 4 mg or as directed by coumadin clinic 06/23/22   Wendall Stade, MD  zolpidem (AMBIEN) 10 MG tablet Take 1 tablet (10 mg total) by mouth at bedtime as needed for  sleep. Patient taking differently: Take 5 mg by mouth at bedtime as needed for sleep. 06/27/22 07/27/22  Shon Hale, MD      Allergies    Amiodarone and Penicillins    Review of Systems   Review of Systems  Physical Exam Updated Vital Signs BP (!) 148/74 (BP Location: Left Arm)   Pulse 89   Temp 98.5 F (36.9 C) (Oral)   Resp 17   LMP 07/16/2013   SpO2 100%  Physical Exam Vitals and nursing note reviewed.  Eyes:     Pupils: Pupils are equal, round, and reactive to light.  Cardiovascular:     Rate and Rhythm:  Regular rhythm.  Pulmonary:     Breath sounds: No wheezing.  Chest:     Chest wall: No tenderness.  Abdominal:     Tenderness: There is no guarding.  Musculoskeletal:        General: Tenderness present.     Cervical back: Neck supple.     Comments: Some mild lumbar tenderness.  Skin:    Capillary Refill: Capillary refill takes less than 2 seconds.  Neurological:     Mental Status: She is alert.     ED Results / Procedures / Treatments   Labs (all labs ordered are listed, but only abnormal results are displayed) Labs Reviewed  CBC WITH DIFFERENTIAL/PLATELET - Abnormal; Notable for the following components:      Result Value   WBC 55.2 (*)    Hemoglobin 10.0 (*)    HCT 31.4 (*)    MCH 25.8 (*)    RDW 16.5 (*)    Neutro Abs 52.4 (*)    Lymphs Abs 0.0 (*)    Monocytes Absolute 1.7 (*)    Basophils Absolute 0.6 (*)    Abs Immature Granulocytes 0.60 (*)    All other components within normal limits  PROTIME-INR - Abnormal; Notable for the following components:   Prothrombin Time 33.6 (*)    INR 3.3 (*)    All other components within normal limits  COMPREHENSIVE METABOLIC PANEL - Abnormal; Notable for the following components:   Potassium 3.3 (*)    Chloride 95 (*)    CO2 33 (*)    Glucose, Bld 125 (*)    Total Protein 6.3 (*)    All other components within normal limits    EKG None  Radiology CT Lumbar Spine Wo Contrast  Result Date:  09/12/2022 CLINICAL DATA:  Lumbar spine fracture EXAM: CT LUMBAR SPINE WITHOUT CONTRAST TECHNIQUE: Multidetector CT imaging of the lumbar spine was performed without intravenous contrast administration. Multiplanar CT image reconstructions were also generated. RADIATION DOSE REDUCTION: This exam was performed according to the departmental dose-optimization program which includes automated exposure control, adjustment of the mA and/or kV according to patient size and/or use of iterative reconstruction technique. COMPARISON:  09/11/2022 FINDINGS: Segmentation: 5 lumbar type vertebrae. Alignment: Dextroscoliosis with apex at L2 Vertebrae: Acute compression fracture of L5 with less than 25% height loss. Chronic wedge compression fracture of L2 with approximately 50% anterior height loss. Paraspinal and other soft tissues: Calcific aortic atherosclerosis. Numerous retroperitoneal lymph nodes. Disc levels: There is no spinal canal stenosis. Severe right L5 foraminal stenosis. IMPRESSION: 1. Acute compression fracture of L5 with less than 25% height loss. No retropulsion or spinal canal compromise. 2. Chronic wedge compression fracture of L2 with approximately 50% anterior height loss. 3. Severe right L5 foraminal stenosis. 4. Numerous retroperitoneal lymph nodes in keeping with diagnosis of lymphoma. Aortic Atherosclerosis (ICD10-I70.0). Electronically Signed   By: Deatra Robinson M.D.   On: 09/12/2022 18:56   CT Angio Chest PE W and/or Wo Contrast  Result Date: 09/12/2022 CLINICAL DATA:  PE suspected. History of stage IV follicular lymphoma; currently on chemotherapy. Recently diagnosed lumbar fracture. EXAM: CT ANGIOGRAPHY CHEST WITH CONTRAST TECHNIQUE: Multidetector CT imaging of the chest was performed using the standard protocol during bolus administration of intravenous contrast. Multiplanar CT image reconstructions and MIPs were obtained to evaluate the vascular anatomy. RADIATION DOSE REDUCTION: This exam was  performed according to the departmental dose-optimization program which includes automated exposure control, adjustment of the mA and/or kV according to patient size and/or  use of iterative reconstruction technique. CONTRAST:  80mL OMNIPAQUE IOHEXOL 350 MG/ML SOLN COMPARISON:  CT chest 06/03/2022 FINDINGS: Cardiovascular: Satisfactory opacification of the pulmonary arteries to the segmental level. No evidence of pulmonary embolism. Stable cardiomegaly with four-chamber enlargement. 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 Left atrial appendage closure device. No pericardial effusion. Accessory left superior vena cava. Right IJ port central venous catheter tip in the right atrium. Mediastinum/Nodes: Interval improvement of the diffuse bilateral axillary, lower cervical, mediastinal and hilar lymphadenopathy noted on 06/03/2022. The previously documented index pretracheal node measures 1.4 x 0.8 cm compared to 1.4 x 1.2 cm previously (series 6, image 46). The thyroid gland, esophagus, and trachea demonstrate no significant findings. Lungs/Pleura: Stable thin linear scarring in the lingula. Lungs are otherwise clear. No pleural effusion or pneumothorax. Upper Abdomen: No acute abnormality. Musculoskeletal: Compression fracture of the T10 vertebral body, with approximately 50% vertebral body height loss, new compared to chest radiograph 08/17/2022. Review of the MIP images confirms the above findings. IMPRESSION: 1. No evidence of pulmonary embolism as queried.  Lungs are clear. 2. Compression fracture of the T10 vertebral body, with approximately 50% vertebral body height loss, new compared to chest radiograph 08/17/2022. 3. Interval improvement of the diffuse bilateral axillary, lower cervical, mediastinal and hilar lymphadenopathy compared to prior CT chest 06/03/2022. 4. Stable cardiomegaly. Electronically Signed   By: Sherron Ales M.D.   On: 09/12/2022 17:55   DG Lumbar  Spine Complete  Result Date: 09/11/2022 CLINICAL DATA:  Back pain for 6 days. Reports history of compression fracture. EXAM: LUMBAR SPINE - COMPLETE 4+ VIEW COMPARISON:  Whole-body PET-CT 06/18/2022. FINDINGS: There are 5 non-rib-bearing lumbar-type vertebral bodies. There is significant dextrocurvature centered at L1, similar to the prior PET-CT. There is compression deformity of the L1 vertebral body with up to approximately 50% loss of vertebral body height which is similar to the prior PET-CT and radiograph from 01/14/2021. There is compression deformity of the L5 vertebral body which appears new since the prior PET-CT and could be acute. There is trace retrolisthesis of L1 on L2. There is no other significant antero or retrolisthesis. There is no evidence of spondylolysis. There is overall mild multilevel degenerative change. The SI joints are intact. The soft tissues are unremarkable. IMPRESSION: 1. Compression deformity of the L5 vertebral body appears new since the prior PET-CT from February and could be acute. Consider CT or MRI for further evaluation. 2. Unchanged compression deformity of the L1 vertebral body. 3. 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 Otherwise overall mild degenerative changes. Electronically Signed   By: Lesia Hausen M.D.   On: 09/11/2022 16:00   DG Ribs Unilateral W/Chest Right  Result Date: 09/11/2022 CLINICAL DATA:  Right posterior rib pain and thoracic pain for 6 days. EXAM: RIGHT RIBS AND CHEST - 3+ VIEW COMPARISON:  Chest radiograph 08/17/2022. 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 The atrial appendage clipping device is stable. The cardiomediastinal silhouette is stable. There is no focal consolidation or pulmonary edema. There is no pleural effusion or pneumothorax No displaced rib fracture or other acute osseous abnormality is identified. IMPRESSION: No displaced rib fracture or other acute osseous  abnormality identified. Electronically Signed   By: Lesia Hausen M.D.   On: 09/11/2022 15:55    Procedures Procedures    Medications Ordered in ED Medications  HYDROmorphone (DILAUDID) injection 1 mg (1 mg Intravenous Given 09/12/22 1547)  iohexol (OMNIPAQUE) 350 MG/ML  injection 100 mL (80 mLs Intravenous Contrast Given 09/12/22 1643)  HYDROmorphone (DILAUDID) injection 1 mg (1 mg Intravenous Given 09/12/22 1847)    ED Course/ Medical Decision Making/ A&P                             Medical Decision Making Amount and/or Complexity of Data Reviewed Labs: ordered. Radiology: ordered.  Risk Prescription drug management.   Patient with chest pain.  Chronic shortness of breath states feels a little worse.  Pain is worse with breathing.  With history of malignancy puts patient at higher risk for pulmonary embolism, particularly with episodes going subtherapeutic on her INR.  Also low back pain.  Worse with movements.  Had previous L1 compression fracture but L5 compression found that was new since PET scan 3 months ago.  There also was worried for pathologic fracture or potentially epidural hematoma.  Will get CT scan of the chest and lumbar spine.  Basic blood work and will give some Dilaudid here.  Reviewed urgent care note from yesterday.  CT scan done due to potential pulmonary embolism.  It did not show pulm embolism but did show T10 compression fracture.  This potentially is the cause of the pain that is higher up in her back.  Also has lumbar compression fracture.  Pain much better controlled after IV Dilaudid.  Reviewed notes and she is on hydrocodone.  I think changing to Dilaudid may help.  Has cancer history and this is the reason her white count is 55 now since getting white cell stimulation.  However appears stable for discharge home discussed about possible TLSO brace now versus follow-up as an outpatient.  Pain controlled and we think she can follow as an outpatient.  Given  neurosurgery information.  Will discharge home.        Final Clinical Impression(s) / ED Diagnoses Final diagnoses:  Compression fracture of T10 vertebra, initial encounter (HCC)  Compression fracture of L5 vertebra, initial encounter (HCC)  Cancer associated pain    Rx / DC Orders ED Discharge Orders          Ordered    HYDROmorphone (DILAUDID) 2 MG tablet  Every 4 hours PRN        09/12/22 1930              Benjiman Core, MD 09/12/22 1935

## 2022-09-14 ENCOUNTER — Other Ambulatory Visit: Payer: Self-pay | Admitting: *Deleted

## 2022-09-14 MED ORDER — OXYCODONE HCL 10 MG PO TABS: 10.0000 mg | ORAL_TABLET | Freq: Three times a day (TID) | ORAL | 0 refills | Status: DC

## 2022-09-21 ENCOUNTER — Other Ambulatory Visit: Payer: Self-pay

## 2022-09-21 DIAGNOSIS — C8208 Follicular lymphoma grade I, lymph nodes of multiple sites: Secondary | ICD-10-CM

## 2022-09-23 ENCOUNTER — Ambulatory Visit: Payer: Medicaid Other | Attending: Cardiovascular Disease | Admitting: *Deleted

## 2022-09-23 ENCOUNTER — Ambulatory Visit: Payer: 59 | Admitting: Internal Medicine

## 2022-09-23 DIAGNOSIS — Q231 Congenital insufficiency of aortic valve: Secondary | ICD-10-CM

## 2022-09-23 DIAGNOSIS — I4892 Unspecified atrial flutter: Secondary | ICD-10-CM

## 2022-09-23 DIAGNOSIS — Z5181 Encounter for therapeutic drug level monitoring: Secondary | ICD-10-CM | POA: Diagnosis not present

## 2022-09-23 LAB — POCT INR: INR: 1.9 — AB (ref 2.0–3.0)

## 2022-09-23 MED ORDER — WARFARIN SODIUM 1 MG PO TABS: ORAL_TABLET | ORAL | 5 refills | Status: DC

## 2022-09-23 NOTE — Progress Notes (Unsigned)
[facility] [address], [place] [address] Phone: [phone] Fax: [phone]  SYMPTOM MANAGEMENT CLINIC PROGRESS NOTE   Mallory Sutton [id number] Nov 14, 1960 62 y.o.  Mallory Sutton is managed by Dr. Ellin Saba for stage IV follicular lymphoma, grade 1  Actively treated with chemotherapy/immunotherapy/hormonal therapy: YES  Current therapy: Bendamustine + rituximab  Last treated: Day #1 / cycle #1 on 09/08/2022 ***  INTERVAL HISTORY:  Chief Complaint: Chemotherapy follow-up & symptom management visit  Mallory Sutton is managed by Dr. Ellin Saba for stage IV follicular lymphoma.  She received day 1 of cycle 1 Bendamustine and rituximab on 09/08/2022, with G-CSF given on 09/11/2022.  Since that time, she has been feeling ***. *** Nausea/vomiting/diarrhea *** Mouth sores *** Fatigue - energy ***% *** Appetite ***% *** Oral intake (food) *** Water/liquids *** *** Weight today is ***.  <<OTHER SYMPTOMS>> *** Changes in urine *** Rash or skin changes *** Swelling/fluid buildup *** Chest pain, SOB, DOE (CHRONIC SOB/DOE ***) *** Neuropathy *** New pain (joint pain, headaches, leg pain) *** Bruising or bleeding *** B symptoms (NIGHT SWEATS, SEVERE FATIGUE x 6 to 8 months ***)  *** ER VISIT *** She had ED visit on 09/12/2022 due to chest pain, shortness of breath, and back pain.  She was found to have new L5 compression fracture and new T10 compression fracture.  She takes oxycodone and Tylenol at home ***.  She was referred to neurosurgery by ED physician.  ***  ASSESSMENT & PLAN:  ## STAGE IV FOLLICULAR LYMPHOMA, GRADE 1 - Primary medical oncologist is Dr. Ellin Saba. - PET scan (06/18/2022) with generalized lymphadenopathy in abdomen and retroperitoneum, and to a lesser extent throughout the lower neck, chest, abdomen/pelvis - Received day 1 of cycle 1 Bendamustine and rituximab on 09/08/2022, with G-CSF given on 09/11/2022. - PLAN: ***  # Chronic  shortness of breath, worsening*** - She takes prednisone 20 mg daily since 04/30/2022 *** - PLAN: ***  # Hypokalemia - She takes potassium 40 mEq twice daily *** - PLAN: ***  # Nausea, vomiting, diarrhea prophylaxis *** - Prescription for Phenergan sent, which does not interact with Tikosyn (dofetilide).*** - Lomotil used for treatment of diarrhea, is less likely to interact with Tikosyn *** - PLAN: ***  # *** - *** - PLAN: ***  # *** - *** - PLAN: ***  PLAN SUMMARY: >> *** >> *** >> *** >> Next scheduled appointment with main oncologist: ***   REVIEW OF SYSTEMS: ***  Review of Systems  Past Medical History, Surgical history, Social history, and Family history were reviewed as documented elsewhere in chart, and were updated as appropriate.   OBJECTIVE:  Physical Exam: *** LMP 07/16/2013  ECOG: ***  Physical Exam  Lab Review:     Component Value Date/Time   NA 139 09/12/2022 1546   NA 142 08/21/2022 1234   K 3.3 (L) 09/12/2022 1546   CL 95 (L) 09/12/2022 1546   CO2 33 (H) 09/12/2022 1546   GLUCOSE 125 (H) 09/12/2022 1546   BUN 19 09/12/2022 1546   BUN 16 08/21/2022 1234   CREATININE 0.90 09/12/2022 1546   CREATININE 0.78 03/18/2022 1322   CALCIUM 9.0 09/12/2022 1546   PROT 6.3 (L) 09/12/2022 1546   ALBUMIN 3.8 09/12/2022 1546   AST 18 09/12/2022 1546   ALT 17 09/12/2022 1546   ALKPHOS 101 09/12/2022 1546   BILITOT 0.6 09/12/2022 1546   GFRNONAA >60 09/12/2022 1546   GFRAA >60 08/25/2019 1414  Component Value Date/Time   WBC 55.2 (HH) 09/12/2022 1546   RBC 3.87 09/12/2022 1546   HGB 10.0 (L) 09/12/2022 1546   HGB 14.4 07/05/2019 1653   HCT 31.4 (L) 09/12/2022 1546   HCT 44.2 07/05/2019 1653   PLT 343 09/12/2022 1546   PLT 479 (H) 07/05/2019 1653   MCV 81.1 09/12/2022 1546   MCV 87 07/05/2019 1653   MCH 25.8 (L) 09/12/2022 1546   MCHC 31.8 09/12/2022 1546   RDW 16.5 (H) 09/12/2022 1546   RDW 13.6 07/05/2019 1653   LYMPHSABS 0.0 (L)  09/12/2022 1546   MONOABS 1.7 (H) 09/12/2022 1546   EOSABS 0.0 09/12/2022 1546   BASOSABS 0.6 (H) 09/12/2022 1546   -------------------------------  Imaging from last 24 hours (if applicable): Radiology interpretation: CT Lumbar Spine Wo Contrast  Result Date: 09/12/2022 CLINICAL DATA:  Lumbar spine fracture EXAM: CT LUMBAR SPINE WITHOUT CONTRAST TECHNIQUE: Multidetector CT imaging of the lumbar spine was performed without intravenous contrast administration. Multiplanar CT image reconstructions were also generated. RADIATION DOSE REDUCTION: This exam was performed according to the departmental dose-optimization program which includes automated exposure control, adjustment of the mA and/or kV according to patient size and/or use of iterative reconstruction technique. COMPARISON:  09/11/2022 FINDINGS: Segmentation: 5 lumbar type vertebrae. Alignment: Dextroscoliosis with apex at L2 Vertebrae: Acute compression fracture of L5 with less than 25% height loss. Chronic wedge compression fracture of L2 with approximately 50% anterior height loss. Paraspinal and other soft tissues: Calcific aortic atherosclerosis. Numerous retroperitoneal lymph nodes. Disc levels: There is no spinal canal stenosis. Severe right L5 foraminal stenosis. IMPRESSION: 1. Acute compression fracture of L5 with less than 25% height loss. No retropulsion or spinal canal compromise. 2. Chronic wedge compression fracture of L2 with approximately 50% anterior height loss. 3. Severe right L5 foraminal stenosis. 4. Numerous retroperitoneal lymph nodes in keeping with diagnosis of lymphoma. Aortic Atherosclerosis (ICD10-I70.0). Electronically Signed   By: Deatra Robinson M.D.   On: 09/12/2022 18:56   CT Angio Chest PE W and/or Wo Contrast  Result Date: 09/12/2022 CLINICAL DATA:  PE suspected. History of stage IV follicular lymphoma; currently on chemotherapy. Recently diagnosed lumbar fracture. EXAM: CT ANGIOGRAPHY CHEST WITH CONTRAST  TECHNIQUE: Multidetector CT imaging of the chest was performed using the standard protocol during bolus administration of intravenous contrast. Multiplanar CT image reconstructions and MIPs were obtained to evaluate the vascular anatomy. RADIATION DOSE REDUCTION: This exam was performed according to the departmental dose-optimization program which includes automated exposure control, adjustment of the mA and/or kV according to patient size and/or use of iterative reconstruction technique. CONTRAST:  80mL OMNIPAQUE IOHEXOL 350 MG/ML SOLN COMPARISON:  CT chest 06/03/2022 FINDINGS: Cardiovascular: Satisfactory opacification of the pulmonary arteries to the segmental level. No evidence of pulmonary embolism. Stable cardiomegaly with four-chamber enlargement. 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 Left atrial appendage closure device. No pericardial effusion. Accessory left superior vena cava. Right IJ port central venous catheter tip in the right atrium. Mediastinum/Nodes: Interval improvement of the diffuse bilateral axillary, lower cervical, mediastinal and hilar lymphadenopathy noted on 06/03/2022. The previously documented index pretracheal node measures 1.4 x 0.8 cm compared to 1.4 x 1.2 cm previously (series 6, image 46). The thyroid gland, esophagus, and trachea demonstrate no significant findings. Lungs/Pleura: Stable thin linear scarring in the lingula. Lungs are otherwise clear. No pleural effusion or pneumothorax. Upper Abdomen: No acute abnormality. Musculoskeletal: Compression fracture of the T10 vertebral body, with approximately 50% vertebral body height  loss, new compared to chest radiograph 08/17/2022. Review of the MIP images confirms the above findings. IMPRESSION: 1. No evidence of pulmonary embolism as queried.  Lungs are clear. 2. Compression fracture of the T10 vertebral body, with approximately 50% vertebral body height loss, new compared to chest  radiograph 08/17/2022. 3. Interval improvement of the diffuse bilateral axillary, lower cervical, mediastinal and hilar lymphadenopathy compared to prior CT chest 06/03/2022. 4. Stable cardiomegaly. Electronically Signed   By: Sherron Ales M.D.   On: 09/12/2022 17:55   DG Lumbar Spine Complete  Result Date: 09/11/2022 CLINICAL DATA:  Back pain for 6 days. Reports history of compression fracture. EXAM: LUMBAR SPINE - COMPLETE 4+ VIEW COMPARISON:  Whole-body PET-CT 06/18/2022. FINDINGS: There are 5 non-rib-bearing lumbar-type vertebral bodies. There is significant dextrocurvature centered at L1, similar to the prior PET-CT. There is compression deformity of the L1 vertebral body with up to approximately 50% loss of vertebral body height which is similar to the prior PET-CT and radiograph from 01/14/2021. There is compression deformity of the L5 vertebral body which appears new since the prior PET-CT and could be acute. There is trace retrolisthesis of L1 on L2. There is no other significant antero or retrolisthesis. There is no evidence of spondylolysis. There is overall mild multilevel degenerative change. The SI joints are intact. The soft tissues are unremarkable. IMPRESSION: 1. Compression deformity of the L5 vertebral body appears new since the prior PET-CT from February and could be acute. Consider CT or MRI for further evaluation. 2. Unchanged compression deformity of the L1 vertebral body. 3. 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 Otherwise overall mild degenerative changes. Electronically Signed   By: Lesia Hausen M.D.   On: 09/11/2022 16:00   DG Ribs Unilateral W/Chest Right  Result Date: 09/11/2022 CLINICAL DATA:  Right posterior rib pain and thoracic pain for 6 days. EXAM: RIGHT RIBS AND CHEST - 3+ VIEW COMPARISON:  Chest radiograph 08/17/2022. 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 The atrial appendage clipping device is stable. The  cardiomediastinal silhouette is stable. There is no focal consolidation or pulmonary edema. There is no pleural effusion or pneumothorax No displaced rib fracture or other acute osseous abnormality is identified. IMPRESSION: No displaced rib fracture or other acute osseous abnormality identified. Electronically Signed   By: Lesia Hausen M.D.   On: 09/11/2022 15:55      WRAP UP:  All questions were answered. The patient knows to call the clinic with any problems, questions or concerns.  Medical decision making: ***  Time spent on visit: I spent {CHL ONC TIME VISIT - ZOXWR:[id number]} counseling the patient face to face. The total time spent in the appointment was {CHL ONC TIME VISIT - BJYNW:[id number]} and more than 50% was on counseling.  Carnella Guadalajara, PA-C  ***

## 2022-09-23 NOTE — Patient Instructions (Signed)
Take warfarin 5 tablets tonight then continue 4 tablets daily. Next chemo 6/4 & 6/5 Recheck INR in 1 wk after treatment

## 2022-09-24 ENCOUNTER — Inpatient Hospital Stay: Payer: Medicaid Other

## 2022-09-24 ENCOUNTER — Inpatient Hospital Stay: Payer: Medicaid Other | Admitting: Physician Assistant

## 2022-09-24 VITALS — BP 135/66 | HR 67 | Temp 97.0°F | Resp 18 | Ht 64.0 in | Wt 109.0 lb

## 2022-09-24 VITALS — BP 122/64 | HR 66 | Temp 96.5°F | Resp 16

## 2022-09-24 DIAGNOSIS — S22000A Wedge compression fracture of unspecified thoracic vertebra, initial encounter for closed fracture: Secondary | ICD-10-CM | POA: Diagnosis not present

## 2022-09-24 DIAGNOSIS — E876 Hypokalemia: Secondary | ICD-10-CM

## 2022-09-24 DIAGNOSIS — C8208 Follicular lymphoma grade I, lymph nodes of multiple sites: Secondary | ICD-10-CM

## 2022-09-24 DIAGNOSIS — Z95828 Presence of other vascular implants and grafts: Secondary | ICD-10-CM

## 2022-09-24 DIAGNOSIS — Z5112 Encounter for antineoplastic immunotherapy: Secondary | ICD-10-CM | POA: Diagnosis not present

## 2022-09-24 LAB — COMPREHENSIVE METABOLIC PANEL
ALT: 18 U/L (ref 0–44)
AST: 24 U/L (ref 15–41)
Albumin: 3.7 g/dL (ref 3.5–5.0)
Alkaline Phosphatase: 130 U/L — ABNORMAL HIGH (ref 38–126)
Anion gap: 15 (ref 5–15)
BUN: 16 mg/dL (ref 6–20)
CO2: 30 mmol/L (ref 22–32)
Calcium: 8.6 mg/dL — ABNORMAL LOW (ref 8.9–10.3)
Chloride: 92 mmol/L — ABNORMAL LOW (ref 98–111)
Creatinine, Ser: 0.63 mg/dL (ref 0.44–1.00)
GFR, Estimated: >60 mL/min (ref >60)
Glucose, Bld: 120 mg/dL — ABNORMAL HIGH (ref 70–99)
Potassium: 2.6 mmol/L — CL (ref 3.5–5.1)
Sodium: 137 mmol/L (ref 135–145)
Total Bilirubin: 0.7 mg/dL (ref 0.3–1.2)
Total Protein: 6.5 g/dL (ref 6.5–8.1)

## 2022-09-24 LAB — CBC WITH DIFFERENTIAL/PLATELET
Abs Immature Granulocytes: 0.44 10*3/uL — ABNORMAL HIGH (ref 0.00–0.07)
Basophils Absolute: 0.2 10*3/uL — ABNORMAL HIGH (ref 0.0–0.1)
Basophils Relative: 2 %
Eosinophils Absolute: 0.1 10*3/uL (ref 0.0–0.5)
Eosinophils Relative: 1 %
HCT: 31 % — ABNORMAL LOW (ref 36.0–46.0)
Hemoglobin: 9.6 g/dL — ABNORMAL LOW (ref 12.0–15.0)
Immature Granulocytes: 4 %
Lymphocytes Relative: 27 %
Lymphs Abs: 2.9 10*3/uL (ref 0.7–4.0)
MCH: 26.2 pg (ref 26.0–34.0)
MCHC: 31 g/dL (ref 30.0–36.0)
MCV: 84.7 fL (ref 80.0–100.0)
Monocytes Absolute: 0.7 10*3/uL (ref 0.1–1.0)
Monocytes Relative: 6 %
Neutro Abs: 6.3 10*3/uL (ref 1.7–7.7)
Neutrophils Relative %: 60 %
Platelets: 293 10*3/uL (ref 150–400)
RBC: 3.66 MIL/uL — ABNORMAL LOW (ref 3.87–5.11)
RDW: 19.7 % — ABNORMAL HIGH (ref 11.5–15.5)
WBC: 10.6 10*3/uL — ABNORMAL HIGH (ref 4.0–10.5)
nRBC: 0 % (ref 0.0–0.2)

## 2022-09-24 LAB — MAGNESIUM: Magnesium: 2.1 mg/dL (ref 1.7–2.4)

## 2022-09-24 MED ORDER — POTASSIUM CHLORIDE IN NACL 40-0.9 MEQ/L-% IV SOLN
Freq: Once | INTRAVENOUS | Status: AC
  Filled 2022-09-24: qty 1000

## 2022-09-24 MED ORDER — OXYCODONE-ACETAMINOPHEN 10-325 MG PO TABS
1.0000 | ORAL_TABLET | ORAL | 0 refills | Status: DC | PRN
Start: 2022-09-24 — End: 2022-09-30

## 2022-09-24 MED ORDER — POTASSIUM CHLORIDE CRYS ER 20 MEQ PO TBCR
40.0000 meq | EXTENDED_RELEASE_TABLET | Freq: Three times a day (TID) | ORAL | 3 refills | Status: DC
Start: 2022-09-24 — End: 2022-11-30

## 2022-09-24 MED ORDER — SODIUM CHLORIDE 0.9% FLUSH
10.0000 mL | INTRAVENOUS | Status: DC | PRN
  Administered 2022-09-24: 10 mL via INTRAVENOUS

## 2022-09-24 NOTE — Progress Notes (Signed)
Patient presents today for IVF fluids.  Patient is in satisfactory condition with no new complaints voiced.  Vital signs are stable.  Labs reviewed. Potassium today is 2.6.  Rojelio Brenner, PA-C made aware.  We will give 1 L of NS with 40 mEq of Potassium over 2 hours per provider.    Patient tolerated IVF well with no complaints voiced.  Patient left via wheelchair with daughter in stable condition.  Vital signs stable at discharge.  Follow up as scheduled.

## 2022-09-24 NOTE — Patient Instructions (Signed)
[facility]  Discharge Instructions: Thank you for choosing [facility] to provide your oncology and hematology care.  If you have a lab appointment with the Cancer Center - please note that after April 8th, 2024, all labs will be drawn in the cancer center.  You do not have to check in or register with the main entrance as you have in the past but will complete your check-in in the cancer center.  Wear comfortable clothing and clothing appropriate for easy access to any Portacath or PICC line.   We strive to give you quality time with your provider. You may need to reschedule your appointment if you arrive late (15 or more minutes).  Arriving late affects you and other patients whose appointments are after yours.  Also, if you miss three or more appointments without notifying the office, you may be dismissed from the clinic at the provider's discretion.      For prescription refill requests, have your pharmacy contact our office and allow 72 hours for refills to be completed.  To help prevent nausea and vomiting after your treatment, we encourage you to take your nausea medication as directed.  BELOW ARE SYMPTOMS THAT SHOULD BE REPORTED IMMEDIATELY: *FEVER GREATER THAN 100.4 F (38 C) OR HIGHER *CHILLS OR SWEATING *NAUSEA AND VOMITING THAT IS NOT CONTROLLED WITH YOUR NAUSEA MEDICATION *UNUSUAL SHORTNESS OF BREATH *UNUSUAL BRUISING OR BLEEDING *URINARY PROBLEMS (pain or burning when urinating, or frequent urination) *BOWEL PROBLEMS (unusual diarrhea, constipation, pain near the anus) TENDERNESS IN MOUTH AND THROAT WITH OR WITHOUT PRESENCE OF ULCERS (sore throat, sores in mouth, or a toothache) UNUSUAL RASH, SWELLING OR PAIN  UNUSUAL VAGINAL DISCHARGE OR ITCHING   Items with * indicate a potential emergency and should be followed up as soon as possible or go to the Emergency Department if any problems should occur.  Please show the CHEMOTHERAPY ALERT CARD or  IMMUNOTHERAPY ALERT CARD at check-in to the Emergency Department and triage nurse.  Should you have questions after your visit or need to cancel or reschedule your appointment, please contact [facility] [phone]  and follow the prompts.  Office hours are 8:00 a.m. to 4:30 p.m. Monday - Friday. Please note that voicemails left after 4:00 p.m. may not be returned until the following business day.  We are closed weekends and major holidays. You have access to a nurse at all times for urgent questions. Please call the main number to the clinic [phone] and follow the prompts.  For any non-urgent questions, you may also contact your provider using MyChart. We now offer e-Visits for anyone 18 and older to request care online for non-urgent symptoms. For details visit [email or web address].   Also download the MyChart app! Go to the app store, search "MyChart", open the app, select [facility], and log in with your MyChart username and password.   

## 2022-09-24 NOTE — Patient Instructions (Addendum)
[facility] **VISIT SUMMARY & IMPORTANT INSTRUCTIONS **   You were seen today by Rojelio Brenner PA-C for your chemotherapy follow-up and symptom management visit.    COMPRESSION FRACTURE Your compression fractures may be related to your underlying lymphoma. We will change her pain medication to Percocet (oxycodone 10 mg/acetaminophen 325 mg).  New prescription has been sent to your pharmacy.  Take this every 4-6 hours for moderate to severe pain.  Take with stool softener to avoid constipation.  Please surrender your unused oxycodone 10 mg to local fire department or police station. Referral has been sent to neurosurgery (Dr. Lisbeth Renshaw)  HYPOKALEMIA Your potassium is dangerously low.  You were given IV potassium during your visit today. I would like you to INCREASE your potassium so that you are taking 40 mEq 3 times daily. We will recheck your potassium levels next week and give additional IV potassium if needed.  FOLLOW-UP APPOINTMENT: Your next scheduled visit with Dr. Ellin Saba is on 10/06/2022.  If you have any new or worsening symptoms before that time, do not hesitate to call and request another symptom management visit if needed.  ** Thank you for trusting me with your healthcare!  I strive to provide all of my patients with quality care at each visit.  If you receive a survey for this visit, I would be so grateful to you for taking the time to provide feedback.  Thank you in advance!  ~ Bernadetta Roell                   Dr. Doreatha Massed   &   Rojelio Brenner, PA-C   - - - - - - - - - - - - - - - - - -    Thank you for choosing [facility] at [facility] to provide your oncology and hematology care.  To afford each patient quality time with our provider, please arrive at least 15 minutes before your scheduled appointment time.   If you have a lab appointment with the Cancer Center please come in thru the Main  Entrance and check in at the main information desk.  You need to re-schedule your appointment should you arrive 10 or more minutes late.  We strive to give you quality time with our providers, and arriving late affects you and other patients whose appointments are after yours.  Also, if you no show three or more times for appointments you may be dismissed from the clinic at the providers discretion.     Again, thank you for choosing [facility].  Our hope is that these requests will decrease the amount of time that you wait before being seen by our physicians.       _____________________________________________________________  Should you have questions after your visit to [facility], please contact our office at [phone] and follow the prompts.  Our office hours are 8:00 a.m. and 4:30 p.m. Monday - Friday.  Please note that voicemails left after 4:00 p.m. may not be returned until the following business day.  We are closed weekends and major holidays.  You do have access to a nurse 24-7, just call the main number to the clinic [phone] and do not press any options, hold on the line and a nurse will answer the phone.    For prescription refill requests, have your pharmacy contact our office and allow 72 hours.

## 2022-09-24 NOTE — Progress Notes (Signed)
CRITICAL VALUE ALERT Critical value received:  K+2.6 Date of notification:  09-24-22 Time of notification: 1018 Critical value read back:  Yes.   Nurse who received alert:  C.Mcclain Shall RN MD notified time and response:  Reb. Pennington PA-C.

## 2022-09-24 NOTE — Progress Notes (Signed)
Patients port flushed without difficulty.  Good blood return noted with no bruising or swelling noted at site. Labs drawn. Patient remains access for possible treatment. VSS.

## 2022-09-25 ENCOUNTER — Encounter: Payer: Self-pay | Admitting: Cardiovascular Disease

## 2022-09-29 ENCOUNTER — Other Ambulatory Visit: Payer: Self-pay

## 2022-09-29 ENCOUNTER — Other Ambulatory Visit: Payer: Self-pay | Admitting: *Deleted

## 2022-09-29 DIAGNOSIS — C8208 Follicular lymphoma grade I, lymph nodes of multiple sites: Secondary | ICD-10-CM

## 2022-09-29 MED ORDER — PROMETHAZINE HCL 12.5 MG PO TABS: 12.5000 mg | ORAL_TABLET | Freq: Four times a day (QID) | ORAL | 0 refills | Status: DC | PRN

## 2022-09-30 ENCOUNTER — Inpatient Hospital Stay: Payer: Medicaid Other

## 2022-09-30 ENCOUNTER — Other Ambulatory Visit: Payer: Self-pay | Admitting: Physician Assistant

## 2022-09-30 VITALS — BP 126/52 | HR 67 | Temp 97.5°F | Resp 20

## 2022-09-30 DIAGNOSIS — Z95828 Presence of other vascular implants and grafts: Secondary | ICD-10-CM

## 2022-09-30 DIAGNOSIS — Z5112 Encounter for antineoplastic immunotherapy: Secondary | ICD-10-CM | POA: Diagnosis not present

## 2022-09-30 DIAGNOSIS — E876 Hypokalemia: Secondary | ICD-10-CM

## 2022-09-30 DIAGNOSIS — C8208 Follicular lymphoma grade I, lymph nodes of multiple sites: Secondary | ICD-10-CM

## 2022-09-30 DIAGNOSIS — S22000A Wedge compression fracture of unspecified thoracic vertebra, initial encounter for closed fracture: Secondary | ICD-10-CM

## 2022-09-30 LAB — CBC WITH DIFFERENTIAL/PLATELET
Abs Immature Granulocytes: 0.06 10*3/uL (ref 0.00–0.07)
Basophils Absolute: 0.1 10*3/uL (ref 0.0–0.1)
Basophils Relative: 1 %
Eosinophils Absolute: 0.1 10*3/uL (ref 0.0–0.5)
Eosinophils Relative: 2 %
HCT: 27.2 % — ABNORMAL LOW (ref 36.0–46.0)
Hemoglobin: 8.3 g/dL — ABNORMAL LOW (ref 12.0–15.0)
Immature Granulocytes: 1 %
Lymphocytes Relative: 23 %
Lymphs Abs: 1.8 10*3/uL (ref 0.7–4.0)
MCH: 26.1 pg (ref 26.0–34.0)
MCHC: 30.5 g/dL (ref 30.0–36.0)
MCV: 85.5 fL (ref 80.0–100.0)
Monocytes Absolute: 0.6 10*3/uL (ref 0.1–1.0)
Monocytes Relative: 7 %
Neutro Abs: 5.2 10*3/uL (ref 1.7–7.7)
Neutrophils Relative %: 66 %
Platelets: 302 10*3/uL (ref 150–400)
RBC: 3.18 MIL/uL — ABNORMAL LOW (ref 3.87–5.11)
RDW: 19.7 % — ABNORMAL HIGH (ref 11.5–15.5)
WBC: 7.8 10*3/uL (ref 4.0–10.5)
nRBC: 0 % (ref 0.0–0.2)

## 2022-09-30 LAB — COMPREHENSIVE METABOLIC PANEL
ALT: 19 U/L (ref 0–44)
AST: 23 U/L (ref 15–41)
Albumin: 3.7 g/dL (ref 3.5–5.0)
Alkaline Phosphatase: 89 U/L (ref 38–126)
Anion gap: 12 (ref 5–15)
BUN: 15 mg/dL (ref 6–20)
CO2: 24 mmol/L (ref 22–32)
Calcium: 8.7 mg/dL — ABNORMAL LOW (ref 8.9–10.3)
Chloride: 100 mmol/L (ref 98–111)
Creatinine, Ser: 0.77 mg/dL (ref 0.44–1.00)
GFR, Estimated: >60 mL/min (ref >60)
Glucose, Bld: 145 mg/dL — ABNORMAL HIGH (ref 70–99)
Potassium: 4.3 mmol/L (ref 3.5–5.1)
Sodium: 136 mmol/L (ref 135–145)
Total Bilirubin: 0.6 mg/dL (ref 0.3–1.2)
Total Protein: 6.3 g/dL — ABNORMAL LOW (ref 6.5–8.1)

## 2022-09-30 LAB — MAGNESIUM: Magnesium: 2.3 mg/dL (ref 1.7–2.4)

## 2022-09-30 MED ORDER — OXYCODONE-ACETAMINOPHEN 10-325 MG PO TABS
1.0000 | ORAL_TABLET | Freq: Four times a day (QID) | ORAL | 0 refills | Status: DC | PRN
Start: 2022-09-30 — End: 2022-10-15

## 2022-09-30 MED ORDER — SODIUM CHLORIDE 0.9% FLUSH
10.0000 mL | Freq: Once | INTRAVENOUS | Status: AC
  Administered 2022-09-30: 10 mL via INTRAVENOUS

## 2022-09-30 MED ORDER — HEPARIN SOD (PORK) LOCK FLUSH 100 UNIT/ML IV SOLN
500.0000 [IU] | Freq: Once | INTRAVENOUS | Status: AC
  Administered 2022-09-30: 500 [IU] via INTRAVENOUS

## 2022-09-30 NOTE — Progress Notes (Signed)
No complaints voiced for today's port lab draw.  See flowsheet.  Labs printed and given to the patient.   Patients port flushed without difficulty.  Good blood return noted with no bruising or swelling noted at site.  Band aid applied.  VSS with discharge and left in satisfactory condition with no s/s of distress noted.

## 2022-09-30 NOTE — Patient Instructions (Signed)
[facility]  Discharge Instructions: Thank you for choosing [facility] to provide your oncology and hematology care.  If you have a lab appointment with the Cancer Center - please note that after April 8th, 2024, all labs will be drawn in the cancer center.  You do not have to check in or register with the main entrance as you have in the past but will complete your check-in in the cancer center.  Wear comfortable clothing and clothing appropriate for easy access to any Portacath or PICC line.   We strive to give you quality time with your provider. You may need to reschedule your appointment if you arrive late (15 or more minutes).  Arriving late affects you and other patients whose appointments are after yours.  Also, if you miss three or more appointments without notifying the office, you may be dismissed from the clinic at the provider's discretion.      For prescription refill requests, have your pharmacy contact our office and allow 72 hours for refills to be completed.  To help prevent nausea and vomiting after your treatment, we encourage you to take your nausea medication as directed.  BELOW ARE SYMPTOMS THAT SHOULD BE REPORTED IMMEDIATELY: *FEVER GREATER THAN 100.4 F (38 C) OR HIGHER *CHILLS OR SWEATING *NAUSEA AND VOMITING THAT IS NOT CONTROLLED WITH YOUR NAUSEA MEDICATION *UNUSUAL SHORTNESS OF BREATH *UNUSUAL BRUISING OR BLEEDING *URINARY PROBLEMS (pain or burning when urinating, or frequent urination) *BOWEL PROBLEMS (unusual diarrhea, constipation, pain near the anus) TENDERNESS IN MOUTH AND THROAT WITH OR WITHOUT PRESENCE OF ULCERS (sore throat, sores in mouth, or a toothache) UNUSUAL RASH, SWELLING OR PAIN  UNUSUAL VAGINAL DISCHARGE OR ITCHING   Items with * indicate a potential emergency and should be followed up as soon as possible or go to the Emergency Department if any problems should occur.  Please show the CHEMOTHERAPY ALERT CARD or  IMMUNOTHERAPY ALERT CARD at check-in to the Emergency Department and triage nurse.  Should you have questions after your visit or need to cancel or reschedule your appointment, please contact [facility] [phone]  and follow the prompts.  Office hours are 8:00 a.m. to 4:30 p.m. Monday - Friday. Please note that voicemails left after 4:00 p.m. may not be returned until the following business day.  We are closed weekends and major holidays. You have access to a nurse at all times for urgent questions. Please call the main number to the clinic [phone] and follow the prompts.  For any non-urgent questions, you may also contact your provider using MyChart. We now offer e-Visits for anyone 18 and older to request care online for non-urgent symptoms. For details visit [email or web address].   Also download the MyChart app! Go to the app store, search "MyChart", open the app, select [place], and log in with your MyChart username and password.   

## 2022-10-05 NOTE — Progress Notes (Signed)
[facility] [address], [place] [address]    Clinic Day:  10/06/2022  Referring physician: Leone Payor, FNP  Patient Care Team: Leone Payor, FNP as PCP - General (Family Medicine) Wendall Stade, MD as PCP - Cardiology (Cardiology) Benita Stabile, MD as PCP - Internal Medicine (Internal Medicine) Marinus Maw, MD as PCP - Electrophysiology (Cardiology) Pricilla Riffle, MD as Consulting Physician (Cardiology) Doreatha Massed, MD as Medical Oncologist (Medical Oncology) Therese Sarah, RN as Oncology Nurse Navigator (Medical Oncology)   ASSESSMENT & PLAN:   Assessment: 1.  Stage IV follicular lymphoma, grade 1: - CT chest (06/03/2022): Newly enlarged bilateral axillary, subpectoral, low cervical, mediastinal, hilar and most likely upper abdominal lymph nodes.  No evidence of fibrotic interstitial lung disease. - PET scan (06/18/2022): Generalized lymphadenopathy more in the abdomen and retroperitoneum but seen throughout the low neck, chest, abdomen and pelvis, with SUV ranging between 2-4.  Spleen top normal size at 12 cm with FDG uptake slightly less than the liver.  Hepatic steatosis.  Low-level diffuse FDG activity within the marrow spaces throughout the spine and pelvis with no focal area of increased metabolic activity. - No B symptoms.  She is having more night sweats lately every night. 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  Also has paroxysmal AF. - Right groin lymph node biopsy (06/25/2022): Follicular lymphoma, grade 1 - She complains of severe fatigue for the last 6 to 8 months, gradually worsening and interfering with her day-to-day activities. - BMBX (08/10/2022): Hypercellular bone marrow with involvement by follicular lymphoma (70%) - Cycle 1 of Bendamustine and rituximab on 09/08/2022   2.   Social/family history: - Lives at home with her husband and son.  Seen with daughter Irving Burton today.  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  No chemical exposure.  Non-smoker.  Secondhand smoke exposure present. - Maternal grandmother died of leukemia.  Maternal grandfather had lung cancer.  Paternal grandfather had lung cancer.  Paternal great aunt had cancer.  Maternal uncle had breast cancer x 2.    Plan: 1.  Stage IV follicular lymphoma, grade 1: - Cycle 1 of 4 dose attenuated Bendamustine and rituximab on 09/08/2022. - She felt tired during the second week which improved third week onwards. - She was evaluated in the ER on 09/12/2022 for back pain.  She was found to have compression fractures of L5 and T10 vertebral bodies. - She was evaluated by neurosurgery and is being planned for kyphoplasty.  She is currently taking Percocet 4 times daily for the back pain. - She developed erythematous macular rash on the anterior abdominal wall, not itching but occasionally burns.  She had it 2 weeks ago.  No worsening.  Recommend applying hydrocortisone 1% cream twice daily. - Labs today: Normal LFTs and creatinine.  CBC grossly normal with hemoglobin 9.1. - She will proceed with cycle 2 today.  I will keep Bendamustine dose at 60 mg/m, same as last time due to multiple other comorbidities and nausea issues. - RTC 4 weeks for follow-up.   2.  Worsening shortness of breath: - Continue prednisone 20 mg daily which was started on 04/30/2022.   3.  Hypokalemia: - Continue potassium 40 mEq 3 times daily.  Potassium today is 3.3.  She has  not taken her morning medicine.   4.  Nausea/vomiting prophylaxis: - She had on and off nausea and vomited 3-4 times in the last 4 weeks.   - She was told not to take Phenergan by her cardiology because of potential of prolonging QT interval. - She was unable to obtain scopolamine due to high co-pay.  She will try with a  different pharmacy. - We will place scopolamine patch today.    Orders Placed This Encounter  Procedures   Magnesium    Standing Status:   Future    Standing Expiration Date:   11/03/2023   Uric acid    Standing Status:   Future    Standing Expiration Date:   11/03/2023   Magnesium    Standing Status:   Future    Standing Expiration Date:   12/01/2023   Uric acid    Standing Status:   Future    Standing Expiration Date:   12/01/2023   Magnesium    Standing Status:   Future    Standing Expiration Date:   12/29/2023   Uric acid    Standing Status:   Future    Standing Expiration Date:   12/29/2023   CBC with Differential    Standing Status:   Future    Standing Expiration Date:   12/29/2023   Comprehensive metabolic panel    Standing Status:   Future    Standing Expiration Date:   12/29/2023   Magnesium    Standing Status:   Future    Standing Expiration Date:   01/26/2024   Uric acid    Standing Status:   Future    Standing Expiration Date:   01/26/2024   CBC with Differential    Standing Status:   Future    Standing Expiration Date:   01/26/2024   Comprehensive metabolic panel    Standing Status:   Future    Standing Expiration Date:   01/26/2024      I,Katie Daubenspeck,acting as a scribe for Doreatha Massed, MD.,have documented all relevant documentation on the behalf of Doreatha Massed, MD,as directed by  Doreatha Massed, MD while in the presence of Doreatha Massed, MD.   I, Doreatha Massed MD, have reviewed the above documentation for accuracy and completeness, and I agree with the above.   Doreatha Massed, MD   6/4/202411:02 AM  CHIEF COMPLAINT:   Diagnosis: Grade 1 stage III follicular lymphoma    Cancer Staging  Follicular lymphoma grade I (HCC) Staging form: Hodgkin and Non-Hodgkin Lymphoma, AJCC 8th Edition - Clinical stage from 07/05/2022: Stage IV (Follicular lymphoma) - Unsigned    Prior Therapy: none  Current Therapy:   Bendamustine and rituximab    HISTORY OF PRESENT ILLNESS:   Oncology History  Follicular lymphoma grade I (HCC)  07/05/2022 Initial Diagnosis   Follicular lymphoma grade I (HCC)   09/08/2022 -  Chemotherapy   Patient is on Treatment Plan : NON-HODGKINS LYMPHOMA Rituximab D1 + Bendamustine D1,2 q28d x 6 cycles        INTERVAL HISTORY:   Zanib is a 62 y.o. female presenting to clinic today for follow up of Grade 1 stage III follicular lymphoma. She was last seen by me on 09/08/22. She was also seen by PA Rebekah in our symptom management clinic on 09/24/22.  Since I last saw her, she was seen in the ED on 5/10 and 5/11 for worsening back pain and SOB. Lumbar spine x-ray on 09/11/22 showed a new acute compression deformity  of L5. Further evaluation with lumbar spine CT the following day showed: acute compression fracture of L5 with <25% height loss; chronic wedge compression fracture of L2 with 50% anterior height loss.  An angio chest CT was also obtained on 09/12/22 due to her SOB showing: new compression fracture of T10 with 50% height loss, new since chest CT on 08/17/22; improvement of diffuse lymphadenopathy since 06/03/22 CT.  Today, she states that she is doing well overall. Her appetite level is at 80%. Her energy level is at 50%.  PAST MEDICAL HISTORY:   Past Medical History: Past Medical History:  Diagnosis Date   Aortic stenosis    ATRIAL ARRHYTHMIAS    Bicuspid aortic valve    CHF (congestive heart failure) (HCC)    COARCTATION OF AORTA    Dysrhythmia    ENDOMETRIOSIS    Heart murmur    Lymphoma (HCC)    Paroxysmal atrial fibrillation (HCC)    Port-A-Cath in place 08/31/2022   Shortness of breath    THORACIC AORTIC ANEURYSM    Vasculitis (HCC)     Surgical History: Past Surgical History:  Procedure Laterality Date   CARDIOVERSION N/A 08/29/2019   Procedure: CARDIOVERSION;  Surgeon: Antoine Poche, MD;  Location: [facility];  Service: Endoscopy;  Laterality: N/A;    CARDIOVERSION N/A 07/16/2022   Procedure: CARDIOVERSION;  Surgeon: Sande Rives, MD;  Location: [facility] ENDOSCOPY;  Service: Cardiovascular;  Laterality: N/A;   coarctation repair and residual restenosis     IR BONE MARROW BIOPSY & ASPIRATION  08/10/2022   IR IMAGING GUIDED PORT INSERTION  08/10/2022   KNEE SURGERY     LYMPH NODE BIOPSY Right 06/25/2022   Procedure: LYMPH NODE BIOPSY; right groin;  Surgeon: Lucretia Roers, MD;  Location: [facility];  Service: General;  Laterality: Right;   PORTA CATH INSERTION     TEE WITHOUT CARDIOVERSION N/A 07/12/2019   Procedure: TRANSESOPHAGEAL ECHOCARDIOGRAM (TEE);  Surgeon: Quintella Reichert, MD;  Location: [facility]COPY;  Service: Cardiovascular;  Laterality: N/A;   TEE WITHOUT CARDIOVERSION N/A 08/29/2019   Procedure: TRANSESOPHAGEAL ECHOCARDIOGRAM (TEE) WITH PROPOFOL;  Surgeon: Antoine Poche, MD;  Location: [facility];  Service: Endoscopy;  Laterality: N/A;    Social History: Social History   Socioeconomic History   Marital status: Married    Spouse name: Not on file   Number of children: Not on file   Years of education: Not on file   Highest education level: Not on file  Occupational History   Not on file  Tobacco Use   Smoking status: Never    Passive exposure: Past   Smokeless tobacco: Never   Tobacco comments:    Never smoke 07/14/22  Vaping Use   Vaping Use: Never used  Substance and Sexual Activity   Alcohol use: Yes    Comment: 3 beers 3 nights a week 07/14/22   Drug use: No   Sexual activity: Never  Other Topics Concern   Not on file  Social History Narrative   Not on file   Social Determinants of Health   Financial Resource Strain: High Risk (07/02/2022)   Overall Financial Resource Strain (CARDIA)    Difficulty of Paying Living Expenses: Very hard  Food Insecurity: Food Insecurity Present (07/15/2022)   Hunger Vital Sign    Worried About Running Out of Food in the Last Year: Sometimes true    Ran Out of Food in  the Last Year: Sometimes true  Transportation Needs: No Transportation Needs (  07/17/2022)   PRAPARE - Administrator, Civil Service (Medical): No    Lack of Transportation (Non-Medical): No  Physical Activity: Not on file  Stress: Not on file  Social Connections: Not on file  Intimate Partner Violence: Not At Risk (07/17/2022)   Humiliation, Afraid, Rape, and Kick questionnaire    Fear of Current or Ex-Partner: No    Emotionally Abused: No    Physically Abused: No    Sexually Abused: No    Family History: Family History  Problem Relation Age of Onset   Suicidality Mother    Mental illness Mother    Drug abuse Brother    Healthy Daughter    Healthy Son     Current Medications:  Current Outpatient Medications:    acetaminophen (TYLENOL) 325 MG tablet, Take 2 tablets (650 mg total) by mouth every 6 (six) hours as needed for mild pain (or Fever >/= 101)., Disp: 100 tablet, Rfl: 1   albuterol (VENTOLIN HFA) 108 (90 Base) MCG/ACT inhaler, Inhale 2 puffs into the lungs every 4 (four) hours as needed for wheezing or shortness of breath., Disp: 54 g, Rfl: 6   allopurinol (ZYLOPRIM) 300 MG tablet, Take 1 tablet (300 mg total) by mouth daily., Disp: 30 tablet, Rfl: 3   ALPRAZolam (XANAX) 0.5 MG tablet, Take 0.5 mg by mouth 3 (three) times daily., Disp: , Rfl:    aspirin EC 81 MG tablet, Take 1 tablet (81 mg total) by mouth daily with breakfast., Disp: 30 tablet, Rfl: 11   BENDAMUSTINE HCL IV, Inject into the vein every 28 (twenty-eight) days. Days 1&2 every 28 days, Disp: , Rfl:    Budeson-Glycopyrrol-Formoterol (BREZTRI AEROSPHERE) 160-9-4.8 MCG/ACT AERO, Inhale 2 puffs into the lungs 2 (two) times daily., Disp: 10.7 g, Rfl: 1   calcium-vitamin D (OSCAL WITH D) 500-5 MG-MCG tablet, Take 1 tablet by mouth daily., Disp: 30 tablet, Rfl: 2   dofetilide (TIKOSYN) 250 MCG capsule, Take 1 capsule (250 mcg total) by mouth 2 (two) times daily., Disp: 60 capsule, Rfl: 6   feeding  supplement (ENSURE ENLIVE / ENSURE PLUS) LIQD, Take 237 mLs by mouth 2 (two) times daily between meals., Disp: , Rfl:    furosemide (LASIX) 40 MG tablet, Take 2 tablets (80 mg total) by mouth daily., Disp: 30 tablet, Rfl: 3   gabapentin (NEURONTIN) 300 MG capsule, Take 300-600 mg by mouth See admin instructions. Take 300 mg tablet by mouth in the morning, 300 mg tablet by mouth in the afternoon, and then take two of the 300 mg tablets by mouth for total of 600 mg in the evening per patient, Disp: , Rfl:    ipratropium-albuterol (DUONEB) 0.5-2.5 (3) MG/3ML SOLN, Inhale 3 mLs into the lungs 2 (two) times daily as needed (shortness of breath)., Disp: 360 mL, Rfl: 1   lidocaine-prilocaine (EMLA) cream, Apply a small amount to port a cath site and cover with plastic wrap 1 hour prior to infusion appointments, Disp: 30 g, Rfl: 3   magnesium oxide (MAGOX 400) 400 (240 Mg) MG tablet, Take 1 tablet (400 mg total) by mouth daily., Disp: 30 tablet, Rfl: 2   metoprolol succinate (TOPROL-XL) 25 MG 24 hr tablet, Take 1 tablet (25 mg total) by mouth daily., Disp: 30 tablet, Rfl: 0   metoprolol succinate (TOPROL-XL) 50 MG 24 hr tablet, Take 1 tablet (50 mg total) by mouth daily. Take with or immediately following a meal., Disp: 30 tablet, Rfl: 11   Multiple Vitamin (MULTI-VITAMINS) TABS,  Take 1 tablet by mouth daily. , Disp: , Rfl:    oxyCODONE-acetaminophen (PERCOCET) 10-325 MG tablet, Take 1 tablet by mouth every 6 (six) hours as needed for pain., Disp: 60 tablet, Rfl: 0   pantoprazole (PROTONIX) 40 MG tablet, Take 1 tablet (40 mg total) by mouth daily., Disp: 30 tablet, Rfl: 2   potassium chloride SA (KLOR-CON M) 20 MEQ tablet, Take 2 tablets (40 mEq total) by mouth 3 (three) times daily., Disp: 180 tablet, Rfl: 3   predniSONE (DELTASONE) 20 MG tablet, Take 1 tablet (20 mg total) by mouth daily with breakfast., Disp: 90 tablet, Rfl: 3   riTUXimab (RITUXAN IV), Inject into the vein every 28 (twenty-eight) days.,  Disp: , Rfl:    scopolamine (TRANSDERM-SCOP) 1 MG/3DAYS, Place 1 patch (1.5 mg total) onto the skin every 3 (three) days., Disp: 10 patch, Rfl: 0   venlafaxine (EFFEXOR) 75 MG tablet, Take 75 mg by mouth 2 (two) times daily., Disp: , Rfl:    warfarin (COUMADIN) 1 MG tablet, Take warfarin 4 to 5 tablets daily or as directed by coumadin clinic, Disp: 135 tablet, Rfl: 5   zolpidem (AMBIEN) 10 MG tablet, Take 1 tablet (10 mg total) by mouth at bedtime as needed for sleep. (Patient taking differently: Take 5 mg by mouth at bedtime as needed for sleep.), Disp: 10 tablet, Rfl: 0 No current facility-administered medications for this visit.  Facility-Administered Medications Ordered in Other Visits:    bendamustine (BENDEKA) 87.5 mg in sodium chloride 0.9 % 50 mL (1.6355 mg/mL) chemo infusion, 60 mg/m2 (Treatment Plan Recorded), Intravenous, Once, Doreatha Massed, MD   heparin lock flush 100 unit/mL, 500 Units, Intracatheter, Once PRN, Doreatha Massed, MD   riTUXimab-pvvr (RUXIENCE) 500 mg in sodium chloride 0.9 % 250 mL (1.6667 mg/mL) infusion, 375 mg/m2 (Treatment Plan Recorded), Intravenous, Once, Doreatha Massed, MD   sodium chloride flush (NS) 0.9 % injection 10 mL, 10 mL, Intracatheter, PRN, Doreatha Massed, MD   Allergies: Allergies  Allergen Reactions   Amiodarone Nausea Only   Penicillins Hives    Has patient had a PCN reaction causing immediate rash, facial/tongue/throat swelling, SOB or lightheadedness with hypotension: YES Has patient had a PCN reaction causing severe rash involving mucus membranes or skin necrosis: NO Has patient had a PCN reaction that required hospitalization NO Has patient had a PCN reaction occurring within the last 10 years: NO If all of the above answers are "NO", then may proceed with Cephalosporin use.     REVIEW OF SYSTEMS:   Review of Systems  Constitutional:  Negative for chills, fatigue and fever.  HENT:   Negative for lump/mass,  mouth sores, nosebleeds, sore throat and trouble swallowing.   Eyes:  Negative for eye problems.  Respiratory:  Positive for shortness of breath. Negative for cough.   Cardiovascular:  Negative for chest pain, leg swelling and palpitations.  Gastrointestinal:  Positive for constipation, nausea and vomiting. Negative for abdominal pain and diarrhea.  Genitourinary:  Negative for bladder incontinence, difficulty urinating, dysuria, frequency, hematuria and nocturia.   Musculoskeletal:  Positive for back pain. Negative for arthralgias, flank pain, myalgias and neck pain.  Skin:  Negative for itching and rash.  Neurological:  Positive for dizziness and numbness. Negative for headaches.  Hematological:  Does not bruise/bleed easily.  Psychiatric/Behavioral:  Positive for depression. Negative for sleep disturbance and suicidal ideas. The patient is nervous/anxious.   All other systems reviewed and are negative.    VITALS:   Last menstrual period 07/16/2013.  Wt Readings from Last 3 Encounters:  10/06/22 110 lb 6.4 oz (50.1 kg)  09/24/22 109 lb (49.4 kg)  09/08/22 109 lb 12.8 oz (49.8 kg)    There is no height or weight on file to calculate BMI.  Performance status (ECOG): 1 - Symptomatic but completely ambulatory  PHYSICAL EXAM:   Physical Exam Vitals and nursing note reviewed. Exam conducted with a chaperone present.  Constitutional:      Appearance: Normal appearance.  Cardiovascular:     Rate and Rhythm: Normal rate and regular rhythm.     Pulses: Normal pulses.     Heart sounds: Normal heart sounds.  Pulmonary:     Effort: Pulmonary effort is normal.     Breath sounds: Normal breath sounds.  Abdominal:     Palpations: Abdomen is soft. There is no hepatomegaly, splenomegaly or mass.     Tenderness: There is no abdominal tenderness.  Musculoskeletal:     Right lower leg: No edema.     Left lower leg: No edema.  Lymphadenopathy:     Cervical: No cervical adenopathy.      Right cervical: No superficial, deep or posterior cervical adenopathy.    Left cervical: No superficial, deep or posterior cervical adenopathy.     Upper Body:     Right upper body: No supraclavicular or axillary adenopathy.     Left upper body: No supraclavicular or axillary adenopathy.  Skin:    Comments: Erythematous macular rash on the anterior abdominal wall.  Neurological:     General: No focal deficit present.     Mental Status: She is alert and oriented to person, place, and time.  Psychiatric:        Mood and Affect: Mood normal.        Behavior: Behavior normal.     LABS:      Latest Ref Rng & Units 10/06/2022    8:54 AM 09/30/2022    8:31 AM 09/24/2022    9:37 AM  CBC  WBC 4.0 - 10.5 K/uL 7.3  7.8  10.6   Hemoglobin 12.0 - 15.0 g/dL 9.1  8.3  9.6   Hematocrit 36.0 - 46.0 % 30.0  27.2  31.0   Platelets 150 - 400 K/uL 353  302  293       Latest Ref Rng & Units 10/06/2022    8:54 AM 09/30/2022    8:31 AM 09/24/2022    9:37 AM  CMP  Glucose 70 - 99 mg/dL 161  096  045   BUN 6 - 20 mg/dL 12  15  16    Creatinine 0.44 - 1.00 mg/dL 4.09  8.11  9.14   Sodium 135 - 145 mmol/L 136  136  137   Potassium 3.5 - 5.1 mmol/L 3.3  4.3  2.6   Chloride 98 - 111 mmol/L 100  100  92   CO2 22 - 32 mmol/L 28  24  30    Calcium 8.9 - 10.3 mg/dL 8.5  8.7  8.6   Total Protein 6.5 - 8.1 g/dL 6.6  6.3  6.5   Total Bilirubin 0.3 - 1.2 mg/dL 0.5  0.6  0.7   Alkaline Phos 38 - 126 U/L 85  89  130   AST 15 - 41 U/L 24  23  24    ALT 0 - 44 U/L 19  19  18       No results found for: "CEA1", "CEA" / No results found for: "CEA1", "CEA" No results found  for: "PSA1" No results found for: "CAN199" No results found for: "CAN125"  No results found for: "TOTALPROTELP", "ALBUMINELP", "A1GS", "A2GS", "BETS", "BETA2SER", "GAMS", "MSPIKE", "SPEI" No results found for: "TIBC", "FERRITIN", "IRONPCTSAT" Lab Results  Component Value Date   LDH 200 (H) 06/23/2022     STUDIES:   CT Lumbar Spine Wo  Contrast  Result Date: 09/12/2022 CLINICAL DATA:  Lumbar spine fracture EXAM: CT LUMBAR SPINE WITHOUT CONTRAST TECHNIQUE: Multidetector CT imaging of the lumbar spine was performed without intravenous contrast administration. Multiplanar CT image reconstructions were also generated. RADIATION DOSE REDUCTION: This exam was performed according to the departmental dose-optimization program which includes automated exposure control, adjustment of the mA and/or kV according to patient size and/or use of iterative reconstruction technique. COMPARISON:  09/11/2022 FINDINGS: Segmentation: 5 lumbar type vertebrae. Alignment: Dextroscoliosis with apex at L2 Vertebrae: Acute compression fracture of L5 with less than 25% height loss. Chronic wedge compression fracture of L2 with approximately 50% anterior height loss. Paraspinal and other soft tissues: Calcific aortic atherosclerosis. Numerous retroperitoneal lymph nodes. Disc levels: There is no spinal canal stenosis. Severe right L5 foraminal stenosis. IMPRESSION: 1. Acute compression fracture of L5 with less than 25% height loss. No retropulsion or spinal canal compromise. 2. Chronic wedge compression fracture of L2 with approximately 50% anterior height loss. 3. Severe right L5 foraminal stenosis. 4. Numerous retroperitoneal lymph nodes in keeping with diagnosis of lymphoma. Aortic Atherosclerosis (ICD10-I70.0). Electronically Signed   By: Deatra Robinson M.D.   On: 09/12/2022 18:56   CT Angio Chest PE W and/or Wo Contrast  Result Date: 09/12/2022 CLINICAL DATA:  PE suspected. History of stage IV follicular lymphoma; currently on chemotherapy. Recently diagnosed lumbar fracture. EXAM: CT ANGIOGRAPHY CHEST WITH CONTRAST TECHNIQUE: Multidetector CT imaging of the chest was performed using the standard protocol during bolus administration of intravenous contrast. Multiplanar CT image reconstructions and MIPs were obtained to evaluate the vascular anatomy. RADIATION DOSE  REDUCTION: This exam was performed according to the departmental dose-optimization program which includes automated exposure control, adjustment of the mA and/or kV according to patient size and/or use of iterative reconstruction technique. CONTRAST:  80mL OMNIPAQUE IOHEXOL 350 MG/ML SOLN COMPARISON:  CT chest 06/03/2022 FINDINGS: Cardiovascular: Satisfactory opacification of the pulmonary arteries to the segmental level. No evidence of pulmonary embolism. Stable cardiomegaly with four-chamber enlargement. 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 Left atrial appendage closure device. No pericardial effusion. Accessory left superior vena cava. Right IJ port central venous catheter tip in the right atrium. Mediastinum/Nodes: Interval improvement of the diffuse bilateral axillary, lower cervical, mediastinal and hilar lymphadenopathy noted on 06/03/2022. The previously documented index pretracheal node measures 1.4 x 0.8 cm compared to 1.4 x 1.2 cm previously (series 6, image 46). The thyroid gland, esophagus, and trachea demonstrate no significant findings. Lungs/Pleura: Stable thin linear scarring in the lingula. Lungs are otherwise clear. No pleural effusion or pneumothorax. Upper Abdomen: No acute abnormality. Musculoskeletal: Compression fracture of the T10 vertebral body, with approximately 50% vertebral body height loss, new compared to chest radiograph 08/17/2022. Review of the MIP images confirms the above findings. IMPRESSION: 1. No evidence of pulmonary embolism as queried.  Lungs are clear. 2. Compression fracture of the T10 vertebral body, with approximately 50% vertebral body height loss, new compared to chest radiograph 08/17/2022. 3. Interval improvement of the diffuse bilateral axillary, lower cervical, mediastinal and hilar lymphadenopathy compared to prior CT chest 06/03/2022. 4. Stable cardiomegaly. Electronically Signed   By: Sherron Ales  M.D.   On:  09/12/2022 17:55   DG Lumbar Spine Complete  Result Date: 09/11/2022 CLINICAL DATA:  Back pain for 6 days. Reports history of compression fracture. EXAM: LUMBAR SPINE - COMPLETE 4+ VIEW COMPARISON:  Whole-body PET-CT 06/18/2022. FINDINGS: There are 5 non-rib-bearing lumbar-type vertebral bodies. There is significant dextrocurvature centered at L1, similar to the prior PET-CT. There is compression deformity of the L1 vertebral body with up to approximately 50% loss of vertebral body height which is similar to the prior PET-CT and radiograph from 01/14/2021. There is compression deformity of the L5 vertebral body which appears new since the prior PET-CT and could be acute. There is trace retrolisthesis of L1 on L2. There is no other significant antero or retrolisthesis. There is no evidence of spondylolysis. There is overall mild multilevel degenerative change. The SI joints are intact. The soft tissues are unremarkable. IMPRESSION: 1. Compression deformity of the L5 vertebral body appears new since the prior PET-CT from February and could be acute. Consider CT or MRI for further evaluation. 2. Unchanged compression deformity of the L1 vertebral body. 3. 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 Otherwise overall mild degenerative changes. Electronically Signed   By: Lesia Hausen M.D.   On: 09/11/2022 16:00   DG Ribs Unilateral W/Chest Right  Result Date: 09/11/2022 CLINICAL DATA:  Right posterior rib pain and thoracic pain for 6 days. EXAM: RIGHT RIBS AND CHEST - 3+ VIEW COMPARISON:  Chest radiograph 08/17/2022. 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 The atrial appendage clipping device is stable. The cardiomediastinal silhouette is stable. There is no focal consolidation or pulmonary edema. There is no pleural effusion or pneumothorax No displaced rib fracture or other acute osseous abnormality is identified. IMPRESSION: No displaced rib fracture or  other acute osseous abnormality identified. Electronically Signed   By: Lesia Hausen M.D.   On: 09/11/2022 15:55

## 2022-10-06 ENCOUNTER — Inpatient Hospital Stay: Payer: Medicaid Other | Attending: Hematology

## 2022-10-06 ENCOUNTER — Inpatient Hospital Stay: Payer: Medicaid Other

## 2022-10-06 ENCOUNTER — Inpatient Hospital Stay (HOSPITAL_BASED_OUTPATIENT_CLINIC_OR_DEPARTMENT_OTHER): Payer: Medicaid Other | Admitting: Hematology

## 2022-10-06 VITALS — BP 117/66 | HR 60 | Temp 97.5°F | Resp 19

## 2022-10-06 DIAGNOSIS — Z5189 Encounter for other specified aftercare: Secondary | ICD-10-CM | POA: Diagnosis not present

## 2022-10-06 DIAGNOSIS — Z5112 Encounter for antineoplastic immunotherapy: Secondary | ICD-10-CM | POA: Insufficient documentation

## 2022-10-06 DIAGNOSIS — Z95828 Presence of other vascular implants and grafts: Secondary | ICD-10-CM

## 2022-10-06 DIAGNOSIS — Z5111 Encounter for antineoplastic chemotherapy: Secondary | ICD-10-CM | POA: Insufficient documentation

## 2022-10-06 DIAGNOSIS — E876 Hypokalemia: Secondary | ICD-10-CM

## 2022-10-06 DIAGNOSIS — C8208 Follicular lymphoma grade I, lymph nodes of multiple sites: Secondary | ICD-10-CM

## 2022-10-06 LAB — CBC WITH DIFFERENTIAL/PLATELET
Abs Immature Granulocytes: 0.04 10*3/uL (ref 0.00–0.07)
Basophils Absolute: 0.1 10*3/uL (ref 0.0–0.1)
Basophils Relative: 1 %
Eosinophils Absolute: 0.3 10*3/uL (ref 0.0–0.5)
Eosinophils Relative: 4 %
HCT: 30 % — ABNORMAL LOW (ref 36.0–46.0)
Hemoglobin: 9.1 g/dL — ABNORMAL LOW (ref 12.0–15.0)
Immature Granulocytes: 1 %
Lymphocytes Relative: 22 %
Lymphs Abs: 1.6 10*3/uL (ref 0.7–4.0)
MCH: 25.6 pg — ABNORMAL LOW (ref 26.0–34.0)
MCHC: 30.3 g/dL (ref 30.0–36.0)
MCV: 84.5 fL (ref 80.0–100.0)
Monocytes Absolute: 0.7 10*3/uL (ref 0.1–1.0)
Monocytes Relative: 9 %
Neutro Abs: 4.6 10*3/uL (ref 1.7–7.7)
Neutrophils Relative %: 63 %
Platelets: 353 10*3/uL (ref 150–400)
RBC: 3.55 MIL/uL — ABNORMAL LOW (ref 3.87–5.11)
RDW: 17.9 % — ABNORMAL HIGH (ref 11.5–15.5)
WBC: 7.3 10*3/uL (ref 4.0–10.5)
nRBC: 0 % (ref 0.0–0.2)

## 2022-10-06 LAB — URIC ACID: Uric Acid, Serum: 3.1 mg/dL (ref 2.5–7.1)

## 2022-10-06 LAB — COMPREHENSIVE METABOLIC PANEL
ALT: 19 U/L (ref 0–44)
AST: 24 U/L (ref 15–41)
Albumin: 3.8 g/dL (ref 3.5–5.0)
Alkaline Phosphatase: 85 U/L (ref 38–126)
Anion gap: 8 (ref 5–15)
BUN: 12 mg/dL (ref 6–20)
CO2: 28 mmol/L (ref 22–32)
Calcium: 8.5 mg/dL — ABNORMAL LOW (ref 8.9–10.3)
Chloride: 100 mmol/L (ref 98–111)
Creatinine, Ser: 0.67 mg/dL (ref 0.44–1.00)
GFR, Estimated: >60 mL/min (ref >60)
Glucose, Bld: 107 mg/dL — ABNORMAL HIGH (ref 70–99)
Potassium: 3.3 mmol/L — ABNORMAL LOW (ref 3.5–5.1)
Sodium: 136 mmol/L (ref 135–145)
Total Bilirubin: 0.5 mg/dL (ref 0.3–1.2)
Total Protein: 6.6 g/dL (ref 6.5–8.1)

## 2022-10-06 LAB — MAGNESIUM: Magnesium: 2.3 mg/dL (ref 1.7–2.4)

## 2022-10-06 MED ORDER — CETIRIZINE HCL 10 MG/ML IV SOLN
10.0000 mg | Freq: Once | INTRAVENOUS | Status: AC
  Administered 2022-10-06: 10 mg via INTRAVENOUS
  Filled 2022-10-06: qty 1

## 2022-10-06 MED ORDER — POTASSIUM CHLORIDE CRYS ER 20 MEQ PO TBCR
40.0000 meq | EXTENDED_RELEASE_TABLET | Freq: Once | ORAL | Status: AC
  Administered 2022-10-06: 40 meq via ORAL
  Filled 2022-10-06: qty 2

## 2022-10-06 MED ORDER — SODIUM CHLORIDE 0.9 % IV SOLN
375.0000 mg/m2 | Freq: Once | INTRAVENOUS | Status: AC
  Administered 2022-10-06: 500 mg via INTRAVENOUS
  Filled 2022-10-06: qty 50

## 2022-10-06 MED ORDER — TRANSDERM-SCOP 1 MG/3DAYS TD PT72: 1.0000 | MEDICATED_PATCH | TRANSDERMAL | 0 refills | Status: DC

## 2022-10-06 MED ORDER — SODIUM CHLORIDE 0.9% FLUSH
10.0000 mL | INTRAVENOUS | Status: DC | PRN
  Administered 2022-10-06: 10 mL

## 2022-10-06 MED ORDER — PALONOSETRON HCL INJECTION 0.25 MG/5ML
0.2500 mg | Freq: Once | INTRAVENOUS | Status: AC
  Administered 2022-10-06: 0.25 mg via INTRAVENOUS
  Filled 2022-10-06: qty 5

## 2022-10-06 MED ORDER — SODIUM CHLORIDE 0.9 % IV SOLN
60.0000 mg/m2 | Freq: Once | INTRAVENOUS | Status: AC
  Administered 2022-10-06: 87.5 mg via INTRAVENOUS
  Filled 2022-10-06: qty 3.5

## 2022-10-06 MED ORDER — FAMOTIDINE IN NACL 20-0.9 MG/50ML-% IV SOLN
20.0000 mg | Freq: Once | INTRAVENOUS | Status: AC
  Administered 2022-10-06: 20 mg via INTRAVENOUS
  Filled 2022-10-06: qty 50

## 2022-10-06 MED ORDER — SODIUM CHLORIDE 0.9 % IV SOLN: Freq: Once | INTRAVENOUS | Status: AC

## 2022-10-06 MED ORDER — SODIUM CHLORIDE 0.9 % IV SOLN
10.0000 mg | Freq: Once | INTRAVENOUS | Status: AC
  Administered 2022-10-06: 10 mg via INTRAVENOUS
  Filled 2022-10-06: qty 1

## 2022-10-06 MED ORDER — HEPARIN SOD (PORK) LOCK FLUSH 100 UNIT/ML IV SOLN
500.0000 [IU] | Freq: Once | INTRAVENOUS | Status: AC | PRN
  Administered 2022-10-06: 500 [IU]

## 2022-10-06 MED ORDER — SCOPOLAMINE 1 MG/3DAYS TD PT72
1.0000 | MEDICATED_PATCH | TRANSDERMAL | Status: DC
  Administered 2022-10-06: 1.5 mg via TRANSDERMAL
  Filled 2022-10-06: qty 1

## 2022-10-06 MED ORDER — ACETAMINOPHEN 325 MG PO TABS
650.0000 mg | ORAL_TABLET | Freq: Once | ORAL | Status: AC
  Administered 2022-10-06: 650 mg via ORAL
  Filled 2022-10-06: qty 2

## 2022-10-06 MED ORDER — SODIUM CHLORIDE 0.9% FLUSH
10.0000 mL | Freq: Once | INTRAVENOUS | Status: AC
  Administered 2022-10-06: 10 mL via INTRAVENOUS

## 2022-10-06 NOTE — Progress Notes (Signed)
Patient presents today for chemotherapy infusion. Patient is in satisfactory condition with no new complaints voiced.  Vital signs are stable.  Labs reviewed by Dr. Ellin Saba during the office visit and all labs are within treatment parameters.  Potassium today is 3.3.  We will give Klor Con 40 mEq PO x one dose today per standing orders by Dr. Ellin Saba.  We will proceed with treatment per MD orders.   Scopolomine patch administered behind right ear for nausea per Dr. Ellin Saba.  Patient tolerated treatment well with no complaints voiced.  Patient left via wheelchair with daughter in stable condition.  Vital signs stable at discharge.  Follow up as scheduled.

## 2022-10-06 NOTE — Patient Instructions (Signed)
[facility]  Discharge Instructions: Thank you for choosing [facility] to provide your oncology and hematology care.  If you have a lab appointment with the Cancer Center - please note that after April 8th, 2024, all labs will be drawn in the cancer center.  You do not have to check in or register with the main entrance as you have in the past but will complete your check-in in the cancer center.  Wear comfortable clothing and clothing appropriate for easy access to any Portacath or PICC line.   We strive to give you quality time with your provider. You may need to reschedule your appointment if you arrive late (15 or more minutes).  Arriving late affects you and other patients whose appointments are after yours.  Also, if you miss three or more appointments without notifying the office, you may be dismissed from the clinic at the provider's discretion.      For prescription refill requests, have your pharmacy contact our office and allow 72 hours for refills to be completed.    Today you received the following chemotherapy and/or immunotherapy agents Rituxan/Bendeka.  Rituximab Injection What is this medication? RITUXIMAB (ri TUX i mab) treats leukemia and lymphoma. It works by blocking a protein that causes cancer cells to grow and multiply. This helps to slow or stop the spread of cancer cells. It may also be used to treat autoimmune conditions, such as arthritis. It works by slowing down an overactive immune system. It is a monoclonal antibody. This medicine may be used for other purposes; ask your health care provider or pharmacist if you have questions. COMMON BRAND NAME(S): RIABNI, Rituxan, RUXIENCE, truxima What should I tell my care team before I take this medication? They need to know if you have any of these conditions: Chest pain Heart disease Immune system problems Infection, such as chickenpox, cold sores, hepatitis B, herpes Irregular  heartbeat or rhythm Kidney disease Low blood counts, such as low white cells, platelets, red cells Lung disease Recent or upcoming vaccine An unusual or allergic reaction to rituximab, other medications, foods, dyes, or preservatives Pregnant or trying to get pregnant Breast-feeding How should I use this medication? This medication is injected into a vein. It is given by a care team in a hospital or clinic setting. A special MedGuide will be given to you before each treatment. Be sure to read this information carefully each time. Talk to your care team about the use of this medication in children. While this medication may be prescribed for children as young as 6 months for selected conditions, precautions do apply. Overdosage: If you think you have taken too much of this medicine contact a poison control center or emergency room at once. NOTE: This medicine is only for you. Do not share this medicine with others. What if I miss a dose? Keep appointments for follow-up doses. It is important not to miss your dose. Call your care team if you are unable to keep an appointment. What may interact with this medication? Do not take this medication with any of the following: Live vaccines This medication may also interact with the following: Cisplatin This list may not describe all possible interactions. Give your health care provider a list of all the medicines, herbs, non-prescription drugs, or dietary supplements you use. Also tell them if you smoke, drink alcohol, or use illegal drugs. Some items may interact with your medicine. What should I watch for while using this medication?  Your condition will be monitored carefully while you are receiving this medication. You may need blood work while taking this medication. This medication can cause serious infusion reactions. To reduce the risk your care team may give you other medications to take before receiving this one. Be sure to follow the  directions from your care team. This medication may increase your risk of getting an infection. Call your care team for advice if you get a fever, chills, sore throat, or other symptoms of a cold or flu. Do not treat yourself. Try to avoid being around people who are sick. Call your care team if you are around anyone with measles, chickenpox, or if you develop sores or blisters that do not heal properly. Avoid taking medications that contain aspirin, acetaminophen, ibuprofen, naproxen, or ketoprofen unless instructed by your care team. These medications may hide a fever. This medication may cause serious skin reactions. They can happen weeks to months after starting the medication. Contact your care team right away if you notice fevers or flu-like symptoms with a rash. The rash may be red or purple and then turn into blisters or peeling of the skin. You may also notice a red rash with swelling of the face, lips, or lymph nodes in your neck or under your arms. In some patients, this medication may cause a serious brain infection that may cause death. If you have any problems seeing, thinking, speaking, walking, or standing, tell your care team right away. If you cannot reach your care team, urgently seek another source of medical care. Talk to your care team if you may be pregnant. Serious birth defects can occur if you take this medication during pregnancy and for 12 months after the last dose. You will need a negative pregnancy test before starting this medication. Contraception is recommended while taking this medication and for 12 months after the last dose. Your care team can help you find the option that works for you. Do not breastfeed while taking this medication and for at least 6 months after the last dose. What side effects may I notice from receiving this medication? Side effects that you should report to your care team as soon as possible: Allergic reactions or angioedema--skin rash, itching or  hives, swelling of the face, eyes, lips, tongue, arms, or legs, trouble swallowing or breathing Bowel blockage--stomach cramping, unable to have a bowel movement or pass gas, loss of appetite, vomiting Dizziness, loss of balance or coordination, confusion or trouble speaking Heart attack--pain or tightness in the chest, shoulders, arms, or jaw, nausea, shortness of breath, cold or clammy skin, feeling faint or lightheaded Heart rhythm changes--fast or irregular heartbeat, dizziness, feeling faint or lightheaded, chest pain, trouble breathing Infection--fever, chills, cough, sore throat, wounds that don't heal, pain or trouble when passing urine, general feeling of discomfort or being unwell Infusion reactions--chest pain, shortness of breath or trouble breathing, feeling faint or lightheaded Kidney injury--decrease in the amount of urine, swelling of the ankles, hands, or feet Liver injury--right upper belly pain, loss of appetite, nausea, light-colored stool, dark yellow or [place] urine, yellowing skin or eyes, unusual weakness or fatigue Redness, blistering, peeling, or loosening of the skin, including inside the mouth Stomach pain that is severe, does not go away, or gets worse Tumor lysis syndrome (TLS)--nausea, vomiting, diarrhea, decrease in the amount of urine, dark urine, unusual weakness or fatigue, confusion, muscle pain or cramps, fast or irregular heartbeat, joint pain Side effects that usually do not require medical attention (  report to your care team if they continue or are bothersome): Headache Joint pain Nausea Runny or stuffy nose Unusual weakness or fatigue This list may not describe all possible side effects. Call your doctor for medical advice about side effects. You may report side effects to FDA at 1-800-FDA-1088. Where should I keep my medication? This medication is given in a hospital or clinic. It will not be stored at home. NOTE: This sheet is a summary. It may not cover  all possible information. If you have questions about this medicine, talk to your doctor, pharmacist, or health care provider.  2024 Elsevier/Gold Standard (2021-09-11 00:00:00)    Bendamustine Injection What is this medication? BENDAMUSTINE (BEN da MUS teen) treats leukemia and lymphoma. It works by slowing down the growth of cancer cells. This medicine may be used for other purposes; ask your health care provider or pharmacist if you have questions. COMMON BRAND NAME(S): Marilynne Halsted, VIVIMUSTA What should I tell my care team before I take this medication? They need to know if you have any of these conditions: Infection, especially a viral infection, such as chickenpox, cold sores, herpes Kidney disease Liver disease An unusual or allergic reaction to bendamustine, mannitol, other medications, foods, dyes, or preservatives Pregnant or trying to get pregnant Breast-feeding How should I use this medication? This medication is injected into a vein. It is given by your care team in a hospital or clinic setting. Talk to your care team about the use of this medication in children. Special care may be needed. Overdosage: If you think you have taken too much of this medicine contact a poison control center or emergency room at once. NOTE: This medicine is only for you. Do not share this medicine with others. What if I miss a dose? Keep appointments for follow-up doses. It is important not to miss your dose. Call your care team if you are unable to keep an appointment. What may interact with this medication? Do not take this medication with any of the following: Clozapine This medication may also interact with the following: Atazanavir Cimetidine Ciprofloxacin Enoxacin Fluvoxamine Medications for seizures, such as carbamazepine, phenobarbital Mexiletine Rifampin Tacrine Thiabendazole Zileuton This list may not describe all possible interactions. Give your health care  provider a list of all the medicines, herbs, non-prescription drugs, or dietary supplements you use. Also tell them if you smoke, drink alcohol, or use illegal drugs. Some items may interact with your medicine. What should I watch for while using this medication? Visit your care team for regular checks on your progress. This medication may make you feel generally unwell. This is not uncommon, as chemotherapy can affect healthy cells as well as cancer cells. Report any side effects. Continue your course of treatment even though you feel ill unless your care team tells you to stop. You may need blood work while taking this medication. This medication may increase your risk of getting an infection. Call your care team for advice if you get a fever, chills, sore throat, or other symptoms of a cold or flu. Do not treat yourself. Try to avoid being around people who are sick. This medication may cause serious skin reactions. They can happen weeks to months after starting the medication. Contact your care team right away if you notice fevers or flu-like symptoms with a rash. The rash may be red or purple and then turn into blisters or peeling of the skin. You may also notice a red rash with swelling of the  face, lips, or lymph nodes in your neck or under your arms. In some patients, this medication may cause a serious brain infection that may cause death. If you have any problems seeing, thinking, speaking, walking, or standing, tell your care team right away. If you cannot reach your care team, urgently seek other source of medical care. This medication may increase your risk to bruise or bleed. Call your care team if you notice any unusual bleeding. Talk to your care team about your risk of cancer. You may be more at risk for certain types of cancer if you take this medication. Talk to your care team about your risk of skin cancer. You may be more at risk for skin cancer if you take this medication. Talk to  your care team if you or your partner wish to become pregnant or think either of you might be pregnant. This medication can cause serious birth defects if taken during pregnancy or for up to 6 months after the last dose. A negative pregnancy test is required before starting this medication. A reliable form of contraception is recommended while taking this medication and for 6 months after the last dose. Talk to your care team about reliable forms of contraception. Wear a condom while taking this medication and for at least 3 months after the last dose. Do not breast-feed while taking this medication or for at least 1 week after the last dose. This medication may cause infertility. Talk to your care team if you are concerned about your fertility. What side effects may I notice from receiving this medication? Side effects that you should report to your care team as soon as possible: Allergic reactions--skin rash, itching, hives, swelling of the face, lips, tongue, or throat Infection--fever, chills, cough, sore throat, wounds that don't heal, pain or trouble when passing urine, general feeling of discomfort or being unwell Infusion reactions--chest pain, shortness of breath or trouble breathing, feeling faint or lightheaded Liver injury--right upper belly pain, loss of appetite, nausea, light-colored stool, dark yellow or [place] urine, yellowing skin or eyes, unusual weakness or fatigue Low red blood cell level--unusual weakness or fatigue, dizziness, headache, trouble breathing Painful swelling, warmth, or redness of the skin, blisters or sores at the infusion site Rash, fever, and swollen lymph nodes Redness, blistering, peeling, or loosening of the skin, including inside the mouth Tumor lysis syndrome (TLS)--nausea, vomiting, diarrhea, decrease in the amount of urine, dark urine, unusual weakness or fatigue, confusion, muscle pain or cramps, fast or irregular heartbeat, joint pain Unusual bruising or  bleeding Side effects that usually do not require medical attention (report to your care team if they continue or are bothersome): Diarrhea Fatigue Headache Loss of appetite Nausea Vomiting This list may not describe all possible side effects. Call your doctor for medical advice about side effects. You may report side effects to FDA at 1-800-FDA-1088. Where should I keep my medication? This medication is given in a hospital or clinic. It will not be stored at home. NOTE: This sheet is a summary. It may not cover all possible information. If you have questions about this medicine, talk to your doctor, pharmacist, or health care provider.  2024 Elsevier/Gold Standard (2021-08-12 00:00:00)        To help prevent nausea and vomiting after your treatment, we encourage you to take your nausea medication as directed.  BELOW ARE SYMPTOMS THAT SHOULD BE REPORTED IMMEDIATELY: *FEVER GREATER THAN 100.4 F (38 C) OR HIGHER *CHILLS OR SWEATING *NAUSEA AND VOMITING  THAT IS NOT CONTROLLED WITH YOUR NAUSEA MEDICATION *UNUSUAL SHORTNESS OF BREATH *UNUSUAL BRUISING OR BLEEDING *URINARY PROBLEMS (pain or burning when urinating, or frequent urination) *BOWEL PROBLEMS (unusual diarrhea, constipation, pain near the anus) TENDERNESS IN MOUTH AND THROAT WITH OR WITHOUT PRESENCE OF ULCERS (sore throat, sores in mouth, or a toothache) UNUSUAL RASH, SWELLING OR PAIN  UNUSUAL VAGINAL DISCHARGE OR ITCHING   Items with * indicate a potential emergency and should be followed up as soon as possible or go to the Emergency Department if any problems should occur.  Please show the CHEMOTHERAPY ALERT CARD or IMMUNOTHERAPY ALERT CARD at check-in to the Emergency Department and triage nurse.  Should you have questions after your visit or need to cancel or reschedule your appointment, please contact [facility] [phone]  and follow the prompts.  Office hours are 8:00 a.m. to 4:30 p.m. Monday -  Friday. Please note that voicemails left after 4:00 p.m. may not be returned until the following business day.  We are closed weekends and major holidays. You have access to a nurse at all times for urgent questions. Please call the main number to the clinic [phone] and follow the prompts.  For any non-urgent questions, you may also contact your provider using MyChart. We now offer e-Visits for anyone 74 and older to request care online for non-urgent symptoms. For details visit [email or web address].   Also download the MyChart app! Go to the app store, search "MyChart", open the app, select , and log in with your MyChart username and password.

## 2022-10-06 NOTE — Patient Instructions (Signed)
[facility] Discharge Instructions   You were seen and examined today by Dr. Katragadda.  He reviewed the results of your lab work which are normal/stable.   We will proceed with your treatment today.  Return as scheduled.    Thank you for choosing  [facility] Center at [facility] to provide your oncology and hematology care.  To afford each patient quality time with our provider, please arrive at least 15 minutes before your scheduled appointment time.   If you have a lab appointment with the Cancer Center please come in thru the Main Entrance and check in at the main information desk.  You need to re-schedule your appointment should you arrive 10 or more minutes late.  We strive to give you quality time with our providers, and arriving late affects you and other patients whose appointments are after yours.  Also, if you no show three or more times for appointments you may be dismissed from the clinic at the providers discretion.     Again, thank you for choosing [facility].  Our hope is that these requests will decrease the amount of time that you wait before being seen by our physicians.       _____________________________________________________________  Should you have questions after your visit to [facility], please contact our office at [phone] and follow the prompts.  Our office hours are 8:00 a.m. and 4:30 p.m. Monday - Friday.  Please note that voicemails left after 4:00 p.m. may not be returned until the following business day.  We are closed weekends and major holidays.  You do have access to a nurse 24-7, just call the main number to the clinic [phone] and do not press any options, hold on the line and a nurse will answer the phone.    For prescription refill requests, have your pharmacy contact our office and allow 72 hours.    Due to Covid, you will need to wear a mask upon entering  the hospital. If you do not have a mask, a mask will be given to you at the Main Entrance upon arrival. For doctor visits, patients may have 1 support person age 18 or older with them. For treatment visits, patients can not have anyone with them due to social distancing guidelines and our immunocompromised population.      

## 2022-10-06 NOTE — Progress Notes (Signed)
Labs reviewed today with Dr. Ellin Saba during his office visit. Will proceed as planned per MD.

## 2022-10-06 NOTE — Progress Notes (Signed)
Patient has been examined by Dr. Katragadda. Vital signs and labs have been reviewed by MD - ANC, Creatinine, LFTs, hemoglobin, and platelets are within treatment parameters per M.D. - pt may proceed with treatment.  Primary RN and pharmacy notified.  

## 2022-10-07 ENCOUNTER — Inpatient Hospital Stay: Payer: Medicaid Other

## 2022-10-07 ENCOUNTER — Other Ambulatory Visit: Payer: Self-pay

## 2022-10-07 VITALS — BP 135/61 | HR 60 | Temp 98.6°F | Resp 18

## 2022-10-07 DIAGNOSIS — Z95828 Presence of other vascular implants and grafts: Secondary | ICD-10-CM

## 2022-10-07 DIAGNOSIS — Z5112 Encounter for antineoplastic immunotherapy: Secondary | ICD-10-CM | POA: Diagnosis not present

## 2022-10-07 DIAGNOSIS — C8208 Follicular lymphoma grade I, lymph nodes of multiple sites: Secondary | ICD-10-CM

## 2022-10-07 MED ORDER — SODIUM CHLORIDE 0.9 % IV SOLN: Freq: Once | INTRAVENOUS | Status: AC

## 2022-10-07 MED ORDER — SODIUM CHLORIDE 0.9 % IV SOLN
10.0000 mg | Freq: Once | INTRAVENOUS | Status: AC
  Administered 2022-10-07: 10 mg via INTRAVENOUS
  Filled 2022-10-07: qty 1

## 2022-10-07 MED ORDER — SODIUM CHLORIDE 0.9 % IV SOLN
60.0000 mg/m2 | Freq: Once | INTRAVENOUS | Status: AC
  Administered 2022-10-07: 87.5 mg via INTRAVENOUS
  Filled 2022-10-07: qty 3.5

## 2022-10-07 MED ORDER — SODIUM CHLORIDE 0.9% FLUSH
10.0000 mL | INTRAVENOUS | Status: DC | PRN
  Administered 2022-10-07: 10 mL

## 2022-10-07 MED ORDER — HEPARIN SOD (PORK) LOCK FLUSH 100 UNIT/ML IV SOLN
500.0000 [IU] | Freq: Once | INTRAVENOUS | Status: AC | PRN
  Administered 2022-10-07: 500 [IU]

## 2022-10-07 NOTE — Progress Notes (Signed)
Patient presents today for chemotherapy infusion.  Patient is in satisfactory condition with no new complaints voiced.  Vital signs are stable.  Labs from 10/06/2022 are all within treatment parameters.  We will proceed with treatment per MD orders.    Patient tolerated treatment well with no complaints voiced.  Patient left via wheelchair with daughter in stable condition.  Vital signs stable at discharge.  Follow up as scheduled.

## 2022-10-07 NOTE — Patient Instructions (Signed)
[facility]  Discharge Instructions: Thank you for choosing [facility] to provide your oncology and hematology care.  If you have a lab appointment with the Cancer Center - please note that after April 8th, 2024, all labs will be drawn in the cancer center.  You do not have to check in or register with the main entrance as you have in the past but will complete your check-in in the cancer center.  Wear comfortable clothing and clothing appropriate for easy access to any Portacath or PICC line.   We strive to give you quality time with your provider. You may need to reschedule your appointment if you arrive late (15 or more minutes).  Arriving late affects you and other patients whose appointments are after yours.  Also, if you miss three or more appointments without notifying the office, you may be dismissed from the clinic at the provider's discretion.      For prescription refill requests, have your pharmacy contact our office and allow 72 hours for refills to be completed.    Today you received the following chemotherapy and/or immunotherapy agents Bendeka.  Bendamustine Injection What is this medication? BENDAMUSTINE (BEN da MUS teen) treats leukemia and lymphoma. It works by slowing down the growth of cancer cells. This medicine may be used for other purposes; ask your health care provider or pharmacist if you have questions. COMMON BRAND NAME(S): BELRAPZO, BENDEKA, Treanda, VIVIMUSTA What should I tell my care team before I take this medication? They need to know if you have any of these conditions: Infection, especially a viral infection, such as chickenpox, cold sores, herpes Kidney disease Liver disease An unusual or allergic reaction to bendamustine, mannitol, other medications, foods, dyes, or preservatives Pregnant or trying to get pregnant Breast-feeding How should I use this medication? This medication is injected into a vein. It is given  by your care team in a hospital or clinic setting. Talk to your care team about the use of this medication in children. Special care may be needed. Overdosage: If you think you have taken too much of this medicine contact a poison control center or emergency room at once. NOTE: This medicine is only for you. Do not share this medicine with others. What if I miss a dose? Keep appointments for follow-up doses. It is important not to miss your dose. Call your care team if you are unable to keep an appointment. What may interact with this medication? Do not take this medication with any of the following: Clozapine This medication may also interact with the following: Atazanavir Cimetidine Ciprofloxacin Enoxacin Fluvoxamine Medications for seizures, such as carbamazepine, phenobarbital Mexiletine Rifampin Tacrine Thiabendazole Zileuton This list may not describe all possible interactions. Give your health care provider a list of all the medicines, herbs, non-prescription drugs, or dietary supplements you use. Also tell them if you smoke, drink alcohol, or use illegal drugs. Some items may interact with your medicine. What should I watch for while using this medication? Visit your care team for regular checks on your progress. This medication may make you feel generally unwell. This is not uncommon, as chemotherapy can affect healthy cells as well as cancer cells. Report any side effects. Continue your course of treatment even though you feel ill unless your care team tells you to stop. You may need blood work while taking this medication. This medication may increase your risk of getting an infection. Call your care team for advice if you get   a fever, chills, sore throat, or other symptoms of a cold or flu. Do not treat yourself. Try to avoid being around people who are sick. This medication may cause serious skin reactions. They can happen weeks to months after starting the medication. Contact  your care team right away if you notice fevers or flu-like symptoms with a rash. The rash may be red or purple and then turn into blisters or peeling of the skin. You may also notice a red rash with swelling of the face, lips, or lymph nodes in your neck or under your arms. In some patients, this medication may cause a serious brain infection that may cause death. If you have any problems seeing, thinking, speaking, walking, or standing, tell your care team right away. If you cannot reach your care team, urgently seek other source of medical care. This medication may increase your risk to bruise or bleed. Call your care team if you notice any unusual bleeding. Talk to your care team about your risk of cancer. You may be more at risk for certain types of cancer if you take this medication. Talk to your care team about your risk of skin cancer. You may be more at risk for skin cancer if you take this medication. Talk to your care team if you or your partner wish to become pregnant or think either of you might be pregnant. This medication can cause serious birth defects if taken during pregnancy or for up to 6 months after the last dose. A negative pregnancy test is required before starting this medication. A reliable form of contraception is recommended while taking this medication and for 6 months after the last dose. Talk to your care team about reliable forms of contraception. Wear a condom while taking this medication and for at least 3 months after the last dose. Do not breast-feed while taking this medication or for at least 1 week after the last dose. This medication may cause infertility. Talk to your care team if you are concerned about your fertility. What side effects may I notice from receiving this medication? Side effects that you should report to your care team as soon as possible: Allergic reactions--skin rash, itching, hives, swelling of the face, lips, tongue, or throat Infection--fever,  chills, cough, sore throat, wounds that don't heal, pain or trouble when passing urine, general feeling of discomfort or being unwell Infusion reactions--chest pain, shortness of breath or trouble breathing, feeling faint or lightheaded Liver injury--right upper belly pain, loss of appetite, nausea, light-colored stool, dark yellow or Jasira Robinson urine, yellowing skin or eyes, unusual weakness or fatigue Low red blood cell level--unusual weakness or fatigue, dizziness, headache, trouble breathing Painful swelling, warmth, or redness of the skin, blisters or sores at the infusion site Rash, fever, and swollen lymph nodes Redness, blistering, peeling, or loosening of the skin, including inside the mouth Tumor lysis syndrome (TLS)--nausea, vomiting, diarrhea, decrease in the amount of urine, dark urine, unusual weakness or fatigue, confusion, muscle pain or cramps, fast or irregular heartbeat, joint pain Unusual bruising or bleeding Side effects that usually do not require medical attention (report to your care team if they continue or are bothersome): Diarrhea Fatigue Headache Loss of appetite Nausea Vomiting This list may not describe all possible side effects. Call your doctor for medical advice about side effects. You may report side effects to FDA at 1-800-FDA-1088. Where should I keep my medication? This medication is given in a hospital or clinic. It will not be stored   at home. NOTE: This sheet is a summary. It may not cover all possible information. If you have questions about this medicine, talk to your doctor, pharmacist, or health care provider.  2024 Elsevier/Gold Standard (2021-08-12 00:00:00)        To help prevent nausea and vomiting after your treatment, we encourage you to take your nausea medication as directed.  BELOW ARE SYMPTOMS THAT SHOULD BE REPORTED IMMEDIATELY: *FEVER GREATER THAN 100.4 F (38 C) OR HIGHER *CHILLS OR SWEATING *NAUSEA AND VOMITING THAT IS NOT CONTROLLED  WITH YOUR NAUSEA MEDICATION *UNUSUAL SHORTNESS OF BREATH *UNUSUAL BRUISING OR BLEEDING *URINARY PROBLEMS (pain or burning when urinating, or frequent urination) *BOWEL PROBLEMS (unusual diarrhea, constipation, pain near the anus) TENDERNESS IN MOUTH AND THROAT WITH OR WITHOUT PRESENCE OF ULCERS (sore throat, sores in mouth, or a toothache) UNUSUAL RASH, SWELLING OR PAIN  UNUSUAL VAGINAL DISCHARGE OR ITCHING   Items with * indicate a potential emergency and should be followed up as soon as possible or go to the Emergency Department if any problems should occur.  Please show the CHEMOTHERAPY ALERT CARD or IMMUNOTHERAPY ALERT CARD at check-in to the Emergency Department and triage nurse.  Should you have questions after your visit or need to cancel or reschedule your appointment, please contact [facility] [phone]  and follow the prompts.  Office hours are 8:00 a.m. to 4:30 p.m. Monday - Friday. Please note that voicemails left after 4:00 p.m. may not be returned until the following business day.  We are closed weekends and major holidays. You have access to a nurse at all times for urgent questions. Please call the main number to the clinic [phone] and follow the prompts.  For any non-urgent questions, you may also contact your provider using MyChart. We now offer e-Visits for anyone 18 and older to request care online for non-urgent symptoms. For details visit [email or web address].   Also download the MyChart app! Go to the app store, search "MyChart", open the app, select [facility], and log in with your MyChart username and password.   

## 2022-10-08 ENCOUNTER — Inpatient Hospital Stay: Payer: Medicaid Other | Admitting: Licensed Clinical Social Worker

## 2022-10-08 DIAGNOSIS — C8208 Follicular lymphoma grade I, lymph nodes of multiple sites: Secondary | ICD-10-CM

## 2022-10-08 NOTE — Progress Notes (Signed)
[facility] CSW Progress Note  Clinical Social Worker  placed a follow up call to pt due to the start of treatment.  Voicemail left informing pt a referral will be placed to Marijean Niemann as previously discussed and reminded pt of the [employer] for financial assistance.  CSW left contact information for any additional questions pt may have.  CSW to remain available as appropriate throughout duration of treatment to provide support.        Rachel Moulds, LCSW Clinical Social Worker [facility]    Patient is participating in a Managed Medicaid Plan:  Yes

## 2022-10-09 ENCOUNTER — Telehealth: Payer: Self-pay

## 2022-10-09 ENCOUNTER — Inpatient Hospital Stay: Payer: Medicaid Other

## 2022-10-09 ENCOUNTER — Other Ambulatory Visit: Payer: Self-pay | Admitting: *Deleted

## 2022-10-09 VITALS — BP 131/82 | HR 139 | Temp 97.6°F | Resp 20

## 2022-10-09 DIAGNOSIS — C8208 Follicular lymphoma grade I, lymph nodes of multiple sites: Secondary | ICD-10-CM

## 2022-10-09 DIAGNOSIS — Z95828 Presence of other vascular implants and grafts: Secondary | ICD-10-CM

## 2022-10-09 DIAGNOSIS — Z5112 Encounter for antineoplastic immunotherapy: Secondary | ICD-10-CM | POA: Diagnosis not present

## 2022-10-09 MED ORDER — PEGFILGRASTIM-FPGK 6 MG/0.6ML ~~LOC~~ SOSY
6.0000 mg | PREFILLED_SYRINGE | Freq: Once | SUBCUTANEOUS | Status: AC
  Administered 2022-10-09: 6 mg via SUBCUTANEOUS
  Filled 2022-10-09: qty 0.6

## 2022-10-09 MED ORDER — PANTOPRAZOLE SODIUM 40 MG PO TBEC: 40.0000 mg | DELAYED_RELEASE_TABLET | Freq: Every day | ORAL | 2 refills | Status: DC

## 2022-10-09 NOTE — Patient Instructions (Signed)
[facility]  Discharge Instructions: Thank you for choosing [facility] to provide your oncology and hematology care.  If you have a lab appointment with the Cancer Center - please note that after April 8th, 2024, all labs will be drawn in the cancer center.  You do not have to check in or register with the main entrance as you have in the past but will complete your check-in in the cancer center.  Wear comfortable clothing and clothing appropriate for easy access to any Portacath or PICC line.   We strive to give you quality time with your provider. You may need to reschedule your appointment if you arrive late (15 or more minutes).  Arriving late affects you and other patients whose appointments are after yours.  Also, if you miss three or more appointments without notifying the office, you may be dismissed from the clinic at the provider's discretion.      For prescription refill requests, have your pharmacy contact our office and allow 72 hours for refills to be completed.    Today you received the following chemotherapy and/or immunotherapy agents pegfilgrastim injection      To help prevent nausea and vomiting after your treatment, we encourage you to take your nausea medication as directed.  BELOW ARE SYMPTOMS THAT SHOULD BE REPORTED IMMEDIATELY: *FEVER GREATER THAN 100.4 F (38 C) OR HIGHER *CHILLS OR SWEATING *NAUSEA AND VOMITING THAT IS NOT CONTROLLED WITH YOUR NAUSEA MEDICATION *UNUSUAL SHORTNESS OF BREATH *UNUSUAL BRUISING OR BLEEDING *URINARY PROBLEMS (pain or burning when urinating, or frequent urination) *BOWEL PROBLEMS (unusual diarrhea, constipation, pain near the anus) TENDERNESS IN MOUTH AND THROAT WITH OR WITHOUT PRESENCE OF ULCERS (sore throat, sores in mouth, or a toothache) UNUSUAL RASH, SWELLING OR PAIN  UNUSUAL VAGINAL DISCHARGE OR ITCHING   Items with * indicate a potential emergency and should be followed up as soon as possible  or go to the Emergency Department if any problems should occur.  Please show the CHEMOTHERAPY ALERT CARD or IMMUNOTHERAPY ALERT CARD at check-in to the Emergency Department and triage nurse.  Should you have questions after your visit or need to cancel or reschedule your appointment, please contact [facility] [phone]  and follow the prompts.  Office hours are 8:00 a.m. to 4:30 p.m. Monday - Friday. Please note that voicemails left after 4:00 p.m. may not be returned until the following business day.  We are closed weekends and major holidays. You have access to a nurse at all times for urgent questions. Please call the main number to the clinic [phone] and follow the prompts.  For any non-urgent questions, you may also contact your provider using MyChart. We now offer e-Visits for anyone 50 and older to request care online for non-urgent symptoms. For details visit [email or web address].   Also download the MyChart app! Go to the app store, search "MyChart", open the app, select [facility], and log in with your MyChart username and password.

## 2022-10-09 NOTE — Telephone Encounter (Signed)
   Pre-operative Risk Assessment    Patient Name: Mallory Sutton  DOB: [birth year] MRN: [id number]      Request for Surgical Clearance    Procedure:   Kyphoplasty  Date of Surgery:  Clearance TBD                                 Surgeon:  Dr. Jhonnie Garner Group or Practice Name:  [facility] Phone number:  [phone] Fax number:  [phone]   Type of Clearance Requested:   - Medical  - Pharmacy:  Hold Aspirin pt will need instructions on when/if to hold   Type of Anesthesia:  General    Additional requests/questions:    SignedZada Finders   10/09/2022, 11:38 AM

## 2022-10-09 NOTE — Telephone Encounter (Signed)
Okay per Dr. Ellin Saba for patient to resume protonix.  Refills sent to her pharmacy.

## 2022-10-09 NOTE — Telephone Encounter (Signed)
Left message for the [facility] with Dr. Val Riles office to please call back to preop to confirm they will want Coumadin to be held as well as ASA.

## 2022-10-09 NOTE — Telephone Encounter (Signed)
I confirmed with Lowella Bandy, surgery scheduler for Dr. Conchita Paris they will need both ASA and coumadin held. I will update the preop APP.

## 2022-10-09 NOTE — Progress Notes (Signed)
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  She reports that she went into A-fib last night/early this morning.  She states that she has hx of it but is short of breath more than her normal this time.  She is at a rate of 140.  Dr. Ellin Saba is aware.  We will proceed with her injection today as planned.  She will follow up with her cardiologist. Patient also wants to restart her protonix.  Dr. Ellin Saba is okay with that as well.  Patient tolerated her injection without incidence.  She was discharged in stable condition via wheelchair with her daughter.  She will follow up as scheduled.

## 2022-10-12 ENCOUNTER — Ambulatory Visit: Payer: Medicaid Other | Attending: Cardiovascular Disease | Admitting: *Deleted

## 2022-10-12 DIAGNOSIS — Q231 Congenital insufficiency of aortic valve: Secondary | ICD-10-CM

## 2022-10-12 DIAGNOSIS — Z5181 Encounter for therapeutic drug level monitoring: Secondary | ICD-10-CM | POA: Diagnosis not present

## 2022-10-12 DIAGNOSIS — I4892 Unspecified atrial flutter: Secondary | ICD-10-CM

## 2022-10-12 LAB — POCT INR: INR: 4.9 — AB (ref 2.0–3.0)

## 2022-10-12 NOTE — Patient Instructions (Signed)
Hold warfarin tonight and tomorrow night then decrease dose to 4 tablets daily except 3 tablets on Tuesdays, Thursdays and Saturdays. Next chemo 7/2 & 7/3 Recheck INR in 2 wks

## 2022-10-12 NOTE — Telephone Encounter (Signed)
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 INR range 2-3.  Procedure: kyphoplasty Date of procedure: TBD  CrCl 64mL/min Platelet count 353K  Per office protocol, patient can hold warfarin for 5 days prior to procedure. Will forward to MD for input regarding potential Lovenox bridging or not. Usually would bridge with afib and mechanical AVR, however she has lower risk On-X valve, and has also been in NSR s/p Tikosyn load and cardioversion this past March (also has prior LAA occlusion).  **This guidance is not considered finalized until pre-operative APP has relayed final recommendations.**

## 2022-10-13 ENCOUNTER — Telehealth: Payer: Self-pay | Admitting: *Deleted

## 2022-10-13 ENCOUNTER — Encounter: Payer: Self-pay | Admitting: Cardiovascular Disease

## 2022-10-13 NOTE — Telephone Encounter (Signed)
Pt has been scheduled for tele pre op 10/21/22 @ 2:20. Med rec and consent are done. Med rec and consent are done

## 2022-10-13 NOTE — Telephone Encounter (Signed)
Pt has been scheduled for tele pre op 10/21/22 @ 2:20. Med rec and consent are done. Med rec and consent are done.      Patient Consent for Virtual Visit        Mallory Sutton has provided verbal consent on 10/13/2022 for a virtual visit (video or telephone).   CONSENT FOR VIRTUAL VISIT FOR:  Mallory Sutton  By participating in this virtual visit I agree to the following:  I hereby voluntarily request, consent and authorize [facility] and its employed or contracted physicians, physician assistants, nurse practitioners or other licensed health care professionals (the Practitioner), to provide me with telemedicine health care services (the "Services") as deemed necessary by the treating Practitioner. I acknowledge and consent to receive the Services by the Practitioner via telemedicine. I understand that the telemedicine visit will involve communicating with the Practitioner through live audiovisual communication technology and the disclosure of certain medical information by electronic transmission. I acknowledge that I have been given the opportunity to request an in-person assessment or other available alternative prior to the telemedicine visit and am voluntarily participating in the telemedicine visit.  I understand that I have the right to withhold or withdraw my consent to the use of telemedicine in the course of my care at any time, without affecting my right to future care or treatment, and that the Practitioner or I may terminate the telemedicine visit at any time. I understand that I have the right to inspect all information obtained and/or recorded in the course of the telemedicine visit and may receive copies of available information for a reasonable fee.  I understand that some of the potential risks of receiving the Services via telemedicine include:  Delay or interruption in medical evaluation due to technological equipment failure or disruption; Information transmitted may not  be sufficient (e.g. poor resolution of images) to allow for appropriate medical decision making by the Practitioner; and/or  In rare instances, security protocols could fail, causing a breach of personal health information.  Furthermore, I acknowledge that it is my responsibility to provide information about my medical history, conditions and care that is complete and accurate to the best of my ability. I acknowledge that Practitioner's advice, recommendations, and/or decision may be based on factors not within their control, such as incomplete or inaccurate data provided by me or distortions of diagnostic images or specimens that may result from electronic transmissions. I understand that the practice of medicine is not an exact science and that Practitioner makes no warranties or guarantees regarding treatment outcomes. I acknowledge that a copy of this consent can be made available to me via my patient portal [facility] MyChart), or I can request a printed copy by calling the office of  Heart[facility].    I understand that my insurance will be billed for this visit.   I have read or had this consent read to me. I understand the contents of this consent, which adequately explains the benefits and risks of the Services being provided via telemedicine.  I have been provided ample opportunity to ask questions regarding this consent and the Services and have had my questions answered to my satisfaction. I give my informed consent for the services to be provided through the use of telemedicine in my medical care

## 2022-10-13 NOTE — Telephone Encounter (Signed)
Pt can hold warfarin for 5 days before procedure. She does NOT require bridging.

## 2022-10-13 NOTE — Telephone Encounter (Signed)
   Name: Mallory Sutton  DOB: 11-25-1960  MRN: [id number]  Primary Cardiologist: Charlton Haws, MD  Chart reviewed as part of pre-operative protocol coverage. Because of Mallory Sutton's past medical history and time since last visit, she will require a follow-up telephone visit in order to better assess preoperative cardiovascular risk.  Pre-op covering staff: - Please schedule appointment and call patient to inform them. If patient already had an upcoming appointment within acceptable timeframe, please add "pre-op clearance" to the appointment notes so provider is aware. - Please contact requesting surgeon's office via preferred method (i.e, phone, fax) to inform them of need for appointment prior to surgery.  Pt can hold warfarin for 5 days before procedure. She does NOT require bridging. In NSR with On X valve no need for bridge .  Sharlene Dory, PA-C  10/13/2022, 8:56 AM

## 2022-10-14 ENCOUNTER — Encounter (HOSPITAL_COMMUNITY): Payer: Self-pay

## 2022-10-14 ENCOUNTER — Other Ambulatory Visit: Payer: Self-pay

## 2022-10-14 ENCOUNTER — Emergency Department (HOSPITAL_COMMUNITY): Payer: Medicaid Other

## 2022-10-14 ENCOUNTER — Inpatient Hospital Stay (HOSPITAL_COMMUNITY)
Admission: EM | Admit: 2022-10-14 | Discharge: 2022-10-16 | DRG: 309 | Disposition: A | Discharge status: Home / Self Care | Payer: Medicaid Other | Attending: Internal Medicine | Admitting: Internal Medicine

## 2022-10-14 ENCOUNTER — Telehealth: Payer: Self-pay

## 2022-10-14 DIAGNOSIS — Z818 Family history of other mental and behavioral disorders: Secondary | ICD-10-CM

## 2022-10-14 DIAGNOSIS — Z888 Allergy status to other drugs, medicaments and biological substances status: Secondary | ICD-10-CM | POA: Diagnosis not present

## 2022-10-14 DIAGNOSIS — Z7982 Long term (current) use of aspirin: Secondary | ICD-10-CM

## 2022-10-14 DIAGNOSIS — E876 Hypokalemia: Secondary | ICD-10-CM | POA: Diagnosis present

## 2022-10-14 DIAGNOSIS — Z79899 Other long term (current) drug therapy: Secondary | ICD-10-CM

## 2022-10-14 DIAGNOSIS — I712 Thoracic aortic aneurysm, without rupture, unspecified: Secondary | ICD-10-CM | POA: Diagnosis present

## 2022-10-14 DIAGNOSIS — I5032 Chronic diastolic (congestive) heart failure: Secondary | ICD-10-CM | POA: Diagnosis present

## 2022-10-14 DIAGNOSIS — I350 Nonrheumatic aortic (valve) stenosis: Secondary | ICD-10-CM | POA: Diagnosis present

## 2022-10-14 DIAGNOSIS — Z952 Presence of prosthetic heart valve: Secondary | ICD-10-CM

## 2022-10-14 DIAGNOSIS — Z5986 Financial insecurity: Secondary | ICD-10-CM | POA: Diagnosis not present

## 2022-10-14 DIAGNOSIS — J449 Chronic obstructive pulmonary disease, unspecified: Secondary | ICD-10-CM | POA: Diagnosis present

## 2022-10-14 DIAGNOSIS — I509 Heart failure, unspecified: Secondary | ICD-10-CM | POA: Diagnosis not present

## 2022-10-14 DIAGNOSIS — Z7901 Long term (current) use of anticoagulants: Secondary | ICD-10-CM

## 2022-10-14 DIAGNOSIS — C8200 Follicular lymphoma grade I, unspecified site: Secondary | ICD-10-CM | POA: Diagnosis present

## 2022-10-14 DIAGNOSIS — C8590 Non-Hodgkin lymphoma, unspecified, unspecified site: Secondary | ICD-10-CM

## 2022-10-14 DIAGNOSIS — Z880 Allergy status to penicillin: Secondary | ICD-10-CM | POA: Diagnosis not present

## 2022-10-14 DIAGNOSIS — Z7952 Long term (current) use of systemic steroids: Secondary | ICD-10-CM | POA: Diagnosis not present

## 2022-10-14 DIAGNOSIS — J9611 Chronic respiratory failure with hypoxia: Secondary | ICD-10-CM | POA: Diagnosis present

## 2022-10-14 DIAGNOSIS — R002 Palpitations: Secondary | ICD-10-CM | POA: Diagnosis present

## 2022-10-14 DIAGNOSIS — I4891 Unspecified atrial fibrillation: Secondary | ICD-10-CM | POA: Diagnosis present

## 2022-10-14 DIAGNOSIS — R791 Abnormal coagulation profile: Secondary | ICD-10-CM | POA: Diagnosis present

## 2022-10-14 DIAGNOSIS — D72829 Elevated white blood cell count, unspecified: Secondary | ICD-10-CM | POA: Diagnosis present

## 2022-10-14 DIAGNOSIS — I340 Nonrheumatic mitral (valve) insufficiency: Secondary | ICD-10-CM | POA: Diagnosis not present

## 2022-10-14 DIAGNOSIS — F418 Other specified anxiety disorders: Secondary | ICD-10-CM | POA: Diagnosis not present

## 2022-10-14 DIAGNOSIS — I480 Paroxysmal atrial fibrillation: Principal | ICD-10-CM | POA: Diagnosis present

## 2022-10-14 LAB — CBC WITH DIFFERENTIAL/PLATELET
Abs Immature Granulocytes: 0.7 10*3/uL — ABNORMAL HIGH (ref 0.00–0.07)
Band Neutrophils: 25 %
Basophils Absolute: 0 10*3/uL (ref 0.0–0.1)
Basophils Relative: 0 %
Eosinophils Absolute: 0.7 10*3/uL — ABNORMAL HIGH (ref 0.0–0.5)
Eosinophils Relative: 2 %
HCT: 30.7 % — ABNORMAL LOW (ref 36.0–46.0)
Hemoglobin: 9.5 g/dL — ABNORMAL LOW (ref 12.0–15.0)
Lymphocytes Relative: 7 %
Lymphs Abs: 2.6 10*3/uL (ref 0.7–4.0)
MCH: 25.7 pg — ABNORMAL LOW (ref 26.0–34.0)
MCHC: 30.9 g/dL (ref 30.0–36.0)
MCV: 83 fL (ref 80.0–100.0)
Metamyelocytes Relative: 1 %
Monocytes Absolute: 2.2 10*3/uL — ABNORMAL HIGH (ref 0.1–1.0)
Monocytes Relative: 6 %
Myelocytes: 1 %
Neutro Abs: 31 10*3/uL — ABNORMAL HIGH (ref 1.7–7.7)
Neutrophils Relative %: 58 %
Platelets: 365 10*3/uL (ref 150–400)
RBC: 3.7 MIL/uL — ABNORMAL LOW (ref 3.87–5.11)
RDW: 17.5 % — ABNORMAL HIGH (ref 11.5–15.5)
WBC: 37.4 10*3/uL — ABNORMAL HIGH (ref 4.0–10.5)
nRBC: 0 % (ref 0.0–0.2)

## 2022-10-14 LAB — COMPREHENSIVE METABOLIC PANEL WITH GFR
ALT: 16 U/L (ref 0–44)
AST: 20 U/L (ref 15–41)
Albumin: 3.8 g/dL (ref 3.5–5.0)
Alkaline Phosphatase: 186 U/L — ABNORMAL HIGH (ref 38–126)
Anion gap: 12 (ref 5–15)
BUN: 11 mg/dL (ref 6–20)
CO2: 30 mmol/L (ref 22–32)
Calcium: 9 mg/dL (ref 8.9–10.3)
Chloride: 93 mmol/L — ABNORMAL LOW (ref 98–111)
Creatinine, Ser: 0.68 mg/dL (ref 0.44–1.00)
GFR, Estimated: >60 mL/min (ref >60)
Glucose, Bld: 110 mg/dL — ABNORMAL HIGH (ref 70–99)
Potassium: 3 mmol/L — ABNORMAL LOW (ref 3.5–5.1)
Sodium: 135 mmol/L (ref 135–145)
Total Bilirubin: 0.6 mg/dL (ref 0.3–1.2)
Total Protein: 6.3 g/dL — ABNORMAL LOW (ref 6.5–8.1)

## 2022-10-14 LAB — PROTIME-INR
INR: 1.8 — ABNORMAL HIGH (ref 0.8–1.2)
Prothrombin Time: 21 seconds — ABNORMAL HIGH (ref 11.4–15.2)

## 2022-10-14 MED ORDER — HYDROMORPHONE HCL 1 MG/ML IJ SOLN
1.0000 mg | Freq: Once | INTRAMUSCULAR | Status: AC
  Administered 2022-10-14: 1 mg via INTRAVENOUS
  Filled 2022-10-14: qty 1

## 2022-10-14 MED ORDER — METOPROLOL TARTRATE 5 MG/5ML IV SOLN
2.5000 mg | Freq: Once | INTRAVENOUS | Status: AC
  Administered 2022-10-14: 2.5 mg via INTRAVENOUS
  Filled 2022-10-14: qty 5

## 2022-10-14 MED ORDER — MUPIROCIN 2 % EX OINT
1.0000 | TOPICAL_OINTMENT | Freq: Two times a day (BID) | CUTANEOUS | Status: DC
  Administered 2022-10-15 – 2022-10-16 (×4): 1 via NASAL
  Filled 2022-10-14: qty 22

## 2022-10-14 MED ORDER — POTASSIUM CHLORIDE CRYS ER 20 MEQ PO TBCR
40.0000 meq | EXTENDED_RELEASE_TABLET | Freq: Once | ORAL | Status: AC
  Administered 2022-10-14: 40 meq via ORAL
  Filled 2022-10-14: qty 2

## 2022-10-14 MED ORDER — HEPARIN (PORCINE) 25000 UT/250ML-% IV SOLN
1000.0000 [IU]/h | INTRAVENOUS | Status: DC
  Administered 2022-10-14: 700 [IU]/h via INTRAVENOUS
  Filled 2022-10-14: qty 250

## 2022-10-14 MED ORDER — CHLORHEXIDINE GLUCONATE CLOTH 2 % EX PADS
6.0000 | MEDICATED_PAD | Freq: Every day | CUTANEOUS | Status: DC
  Administered 2022-10-15: 6 via TOPICAL

## 2022-10-14 NOTE — ED Provider Notes (Signed)
[place] EMERGENCY DEPARTMENT AT [facility] Provider Note   CSN: [id number] Arrival date & time: 10/14/22  1731     History  Chief Complaint  Patient presents with   Palpitations    Mallory Sutton is a 62 y.o. female.  Patient presenting with a rapid heart rate since Thursday.  Patient has a history of atrial flutter but today's rhythm seems to be atrial fibs with RVR.  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  Patient is on Tikosyn to control this.  Patient is followed by cardiology.  Patient is supposed to be on Coumadin.  She was told that her level was high so she has been holding it for the past 2 days.  Patient denies any chest pain or shortness of breath.  The patient also does have a history of compression fractures.  Patient has a history of congenital heart disease and has had surgery.  Known to have bicuspid aortic valve and aortic stenosis.  Known to have a history of congestive heart failure has a Port-A-Cath in place since April 2024 has a history of the lymphoma.  As a thoracic aortic aneurysm as well.  Patient does not use tobacco products.         Home Medications Prior to Admission medications   Medication Sig Start Date End Date Taking? Authorizing Provider  acetaminophen (TYLENOL) 325 MG tablet Take 2 tablets (650 mg total) by mouth every 6 (six) hours as needed for mild pain (or Fever >/= 101). 06/27/22   Emokpae, Courage, MD  albuterol (VENTOLIN HFA) 108 (90 Base) MCG/ACT inhaler Inhale 2 puffs into the lungs every 4 (four) hours as needed for wheezing or shortness of breath. 06/27/22   Shon Hale, MD  allopurinol (ZYLOPRIM) 300 MG tablet Take 1 tablet (300 mg total) by mouth daily. 08/31/22   Doreatha Massed, MD  ALPRAZolam Prudy Feeler) 0.5 MG tablet Take 0.5 mg by mouth 3 (three) times daily. 07/01/22   [provider]   aspirin EC 81 MG tablet Take 1 tablet (81 mg total) by mouth daily with breakfast. 06/27/22   Mariea Clonts, Courage, MD  BENDAMUSTINE HCL IV Inject into the vein every 28 (twenty-eight) days. Days 1&2 every 28 days 09/08/22   [provider]  Budeson-Glycopyrrol-Formoterol (BREZTRI AEROSPHERE) 160-9-4.8 MCG/ACT AERO Inhale 2 puffs into the lungs 2 (two) times daily. 06/27/22   Shon Hale, MD  calcium-vitamin D (OSCAL WITH D) 500-5 MG-MCG tablet Take 1 tablet by mouth daily. 11/10/21   Vassie Loll, MD  dofetilide (TIKOSYN) 250 MCG capsule Take 1 capsule (250 mcg total) by mouth 2 (two) times daily. 08/12/22   Graciella Freer, PA-C  feeding supplement (ENSURE ENLIVE / ENSURE PLUS) LIQD Take 237 mLs by mouth 2 (two) times daily between meals. 11/10/21   Vassie Loll, MD  furosemide (LASIX) 40 MG tablet Take 2 tablets (80 mg total) by mouth daily. 06/27/22   Shon Hale, MD  gabapentin (NEURONTIN) 300 MG capsule Take 300-600 mg by mouth See admin instructions. Take 300 mg tablet by mouth in the morning, 300 mg tablet by mouth in the afternoon, and then take two of the 300 mg tablets by mouth for total of 600 mg in the evening per patient    [provider]  ipratropium-albuterol (DUONEB) 0.5-2.5 (3) MG/3ML SOLN Inhale 3 mLs into the lungs 2 (two) times daily as needed (  shortness of breath). 06/27/22   Shon Hale, MD  lidocaine-prilocaine (EMLA) cream Apply a small amount to port a cath site and cover with plastic wrap 1 hour prior to infusion appointments 08/31/22   Doreatha Massed, MD  magnesium oxide (MAGOX 400) 400 (240 Mg) MG tablet Take 1 tablet (400 mg total) by mouth daily. 02/06/22   Croitoru, Mihai, MD  metoprolol succinate (TOPROL-XL) 25 MG 24 hr tablet Take 1 tablet (25 mg total) by mouth daily. 08/17/22   Terrilee Files, MD  metoprolol succinate (TOPROL-XL) 50 MG 24 hr tablet Take 1 tablet (50 mg total) by mouth daily. Take with or immediately following a  meal. 06/29/22 06/24/23  Wendall Stade, MD  Multiple Vitamin (MULTI-VITAMINS) TABS Take 1 tablet by mouth daily.     [provider]  oxyCODONE-acetaminophen (PERCOCET) 10-325 MG tablet Take 1 tablet by mouth every 6 (six) hours as needed for pain. 09/30/22   Carnella Guadalajara, PA-C  pantoprazole (PROTONIX) 40 MG tablet Take 1 tablet (40 mg total) by mouth daily. 10/09/22   Doreatha Massed, MD  potassium chloride SA (KLOR-CON M) 20 MEQ tablet Take 2 tablets (40 mEq total) by mouth 3 (three) times daily. 09/24/22   Carnella Guadalajara, PA-C  predniSONE (DELTASONE) 20 MG tablet Take 1 tablet (20 mg total) by mouth daily with breakfast. 04/29/22   Wendall Stade, MD  riTUXimab (RITUXAN IV) Inject into the vein every 28 (twenty-eight) days. 09/08/22   [provider]  scopolamine (TRANSDERM-SCOP) 1 MG/3DAYS Place 1 patch (1.5 mg total) onto the skin every 3 (three) days. 10/06/22   Doreatha Massed, MD  venlafaxine (EFFEXOR) 75 MG tablet Take 75 mg by mouth 2 (two) times daily.    [provider]  warfarin (COUMADIN) 1 MG tablet Take warfarin 4 to 5 tablets daily or as directed by coumadin clinic 09/23/22   Wendall Stade, MD  zolpidem (AMBIEN) 10 MG tablet Take 1 tablet (10 mg total) by mouth at bedtime as needed for sleep. Patient not taking: Reported on 10/13/2022 06/27/22 07/27/22  Shon Hale, MD      Allergies    Amiodarone and Penicillins    Review of Systems   Review of Systems  Constitutional:  Negative for chills and fever.  HENT:  Negative for ear pain and sore throat.   Eyes:  Negative for pain and visual disturbance.  Respiratory:  Negative for cough and shortness of breath.   Cardiovascular:  Positive for palpitations. Negative for chest pain.  Gastrointestinal:  Negative for abdominal pain and vomiting.  Genitourinary:  Negative for dysuria and hematuria.  Musculoskeletal:  Positive for back pain. Negative for arthralgias.  Skin:  Negative  for color change and rash.  Neurological:  Negative for seizures and syncope.  Hematological:  Bruises/bleeds easily.  All other systems reviewed and are negative.   Physical Exam Updated Vital Signs BP (!) 115/100   Pulse (!) 153   Resp 20   Ht 1.626 m (5\' 4" )   Wt 50 kg   LMP 07/16/2013   SpO2 98%   BMI 18.92 kg/m  Physical Exam Vitals and nursing note reviewed.  Constitutional:      General: She is not in acute distress.    Appearance: Normal appearance. She is well-developed. She is not ill-appearing.  HENT:     Head: Normocephalic and atraumatic.  Eyes:     Extraocular Movements: Extraocular movements intact.     Conjunctiva/sclera: Conjunctivae normal.  Pupils: Pupils are equal, round, and reactive to light.  Cardiovascular:     Rate and Rhythm: Tachycardia present. Rhythm irregular.     Heart sounds: No murmur heard. Pulmonary:     Effort: Pulmonary effort is normal. No respiratory distress.     Breath sounds: Normal breath sounds.     Comments: Port-A-Cath in place Abdominal:     Palpations: Abdomen is soft.     Tenderness: There is no abdominal tenderness.  Musculoskeletal:        General: No swelling.     Cervical back: Normal range of motion and neck supple.  Skin:    General: Skin is warm and dry.     Capillary Refill: Capillary refill takes less than 2 seconds.  Neurological:     General: No focal deficit present.     Mental Status: She is alert and oriented to person, place, and time.  Psychiatric:        Mood and Affect: Mood normal.     ED Results / Procedures / Treatments   Labs (all labs ordered are listed, but only abnormal results are displayed) Labs Reviewed  PROTIME-INR - Abnormal; Notable for the following components:      Result Value   Prothrombin Time 21.0 (*)    INR 1.8 (*)    All other components within normal limits  COMPREHENSIVE METABOLIC PANEL - Abnormal; Notable for the following components:   Potassium 3.0 (*)     Chloride 93 (*)    Glucose, Bld 110 (*)    Total Protein 6.3 (*)    Alkaline Phosphatase 186 (*)    All other components within normal limits  CBC WITH DIFFERENTIAL/PLATELET - Abnormal; Notable for the following components:   WBC 37.4 (*)    RBC 3.70 (*)    Hemoglobin 9.5 (*)    HCT 30.7 (*)    MCH 25.7 (*)    RDW 17.5 (*)    Neutro Abs 31.0 (*)    Monocytes Absolute 2.2 (*)    Eosinophils Absolute 0.7 (*)    Abs Immature Granulocytes 0.70 (*)    All other components within normal limits  HEPARIN LEVEL (UNFRACTIONATED)  CBC    EKG None  Radiology DG Chest Port 1 View  Result Date: 10/14/2022 CLINICAL DATA:  Atrial fibrillation. EXAM: PORTABLE CHEST 1 VIEW COMPARISON:  Chest/rib radiographs 09/11/2022. Chest CTA 09/12/2022. FINDINGS: The patient is rotated to the left with grossly unchanged cardiomediastinal silhouette. Sequelae of aortic valve replacement and left atrial appendage clipping are again identified. A right jugular Port-A-Cath remains in place with tip projecting over the high right atrium. Lung volumes are decreased with mild interstitial prominence and mild atelectasis in the left lung base. No overt pulmonary edema, segmental airspace consolidation, sizeable pleural effusion, or pneumothorax is identified. No acute osseous abnormality is seen. IMPRESSION: Low lung volumes without evidence of acute airspace disease. Electronically Signed   By: Sebastian Ache M.D.   On: 10/14/2022 18:37    Procedures Procedures    Medications Ordered in ED Medications  heparin ADULT infusion 100 units/mL (25000 units/216mL) (700 Units/hr Intravenous New Bag/Given 10/14/22 2024)  metoprolol tartrate (LOPRESSOR) injection 2.5 mg (2.5 mg Intravenous Given 10/14/22 2005)  potassium chloride SA (KLOR-CON M) CR tablet 40 mEq (40 mEq Oral Given 10/14/22 2116)  HYDROmorphone (DILAUDID) injection 1 mg (1 mg Intravenous Given 10/14/22 2116)    ED Course/ Medical Decision Making/ A&P  Medical Decision Making Amount and/or Complexity of Data Reviewed Labs: ordered. Radiology: ordered.  Risk Prescription drug management. Decision regarding hospitalization.  CRITICAL CARE Performed by: Vanetta Mulders Total critical care time: 40 minutes Critical care time was exclusive of separately billable procedures and treating other patients. Critical care was necessary to treat or prevent imminent or life-threatening deterioration. Critical care was time spent personally by me on the following activities: development of treatment plan with patient and/or surrogate as well as nursing, discussions with consultants, evaluation of patient's response to treatment, examination of patient, obtaining history from patient or surrogate, ordering and performing treatments and interventions, ordering and review of laboratory studies, ordering and review of radiographic studies, pulse oximetry and re-evaluation of patient's condition.  Discussed with cardiology because her INR was 1.8 she was not therapeutic.  They recommend heparinizing her admitting her here having cardiology consider cardioversion tomorrow use some IV Lopressor since patient already on Lopressor.  To help rate control but nothing had to be real aggressive because this has been ongoing since Thursday.  Heparin started patient given 2.5 mg IV Lopressor.  Brought heart rate down to 130s.  Complete metabolic panel significant for potassium 3.0 patient given 40 mill close potassium orally.  CBC white count 37.4 hemoglobin 9.4 platelets 365.  Patient known to have the lymphoma.  Chest x-ray low lung volumes without evidence of airspace disease.  The right Port-A-Cath remains in place.  As stated discussed with on-call cardiology Dr. Antoine Poche.  Discussed with hospitalist here they will admit here.  Final Clinical Impression(s) / ED Diagnoses Final diagnoses:  Atrial fibrillation with rapid ventricular response  (HCC)  Hypokalemia  Lymphoma, unspecified body region, unspecified lymphoma type [facility])    Rx / DC Orders ED Discharge Orders     None         Vanetta Mulders, MD 10/14/22 2138

## 2022-10-14 NOTE — ED Triage Notes (Signed)
Pt reports recent just finished her second chemo tx and has had her afib flared up ever since.  Pt reports she had a flare up after the first tx.

## 2022-10-14 NOTE — Telephone Encounter (Signed)
Multiple MyChart messages received from Pt, and shared with Dr. Lewayne Bunting in clinic yesterday, 10/13/2022.    Orders received from Dr. Lewayne Bunting 10/13/2022:   1.) Submit referral to Dr. Elberta Fortis to consider Atrial Fib Radio Frequency Ablation.   2.)  If no AFIB RFA after Dr. Elberta Fortis referral, then return appointment with Dr. Lewayne Bunting to discuss PPM / AVN RFA.   Referral will be submitted to Dr. Elberta Fortis.  Pt advised via MyChart, and message willl be sent to EP scheduling for appointment.  ====================================== Urgent / Emergent Message / Assessment 10/14/2022  Pt last MyChart message stated she felt like her Afib was beating too fast, and she was short of breath.  Pt called to follow up and assess her symptoms.    Pt stated her HR was between 150 bpm and 160 bpm, and sometimes goes higher with the rapid Atrial Fibrillation.  Pt states she has been short of breath, and is in pain from compression fractures she is dealing with.  With multiple symptoms and a HR that is way to fast, Pt advised to go to the nearest ER for care, [facility] being the closest.  Pt advised of the care plan / referral per Dr. Ladona Ridgel above, but may need medical interventions today per what she shared on the telephone.    Pt advised to share this note with ER Triage.

## 2022-10-14 NOTE — Progress Notes (Signed)
ANTICOAGULATION CONSULT NOTE - Initial Consult  Pharmacy Consult for Heparin Indication: atrial fibrillation  Allergies  Allergen Reactions   Amiodarone Nausea Only   Penicillins Hives    Has patient had a PCN reaction causing immediate rash, facial/tongue/throat swelling, SOB or lightheadedness with hypotension: YES Has patient had a PCN reaction causing severe rash involving mucus membranes or skin necrosis: NO Has patient had a PCN reaction that required hospitalization NO Has patient had a PCN reaction occurring within the last 10 years: NO If all of the above answers are "NO", then may proceed with Cephalosporin use.     Patient Measurements: Height: 5\' 4"  (162.6 cm) Weight: 50 kg (110 lb 3.7 oz) IBW/kg (Calculated) : 54.7 Heparin Dosing Weight: 50 kg  Vital Signs: BP: 128/98 (06/12 1921) Pulse Rate: 141 (06/12 1921)  Labs: Recent Labs    10/12/22 1515 10/14/22 1826  HGB  --  9.5*  HCT  --  30.7*  PLT  --  365  LABPROT  --  21.0*  INR 4.9* 1.8*  CREATININE  --  0.68    Estimated Creatinine Clearance: 59 mL/min (by C-G formula based on SCr of 0.68 mg/dL).   Medical History: Past Medical History:  Diagnosis Date   Aortic stenosis    ATRIAL ARRHYTHMIAS    Bicuspid aortic valve    CHF (congestive heart failure) (HCC)    COARCTATION OF AORTA    Dysrhythmia    ENDOMETRIOSIS    Heart murmur    Lymphoma (HCC)    Paroxysmal atrial fibrillation (HCC)    Port-A-Cath in place 08/31/2022   Shortness of breath    THORACIC AORTIC ANEURYSM    Vasculitis (HCC)     Assessment:   Pt reports recent just finished her second chemo tx and has had her afib 'flared up' ever since.  Pt reports she had a flare up after the first tx. 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 Goal INR range 2-3. Per Coumadin clinic note, patient was advised to hold Coumadin for 5 days prior to chemo tx with no bridge.  Pharmacy consulted to  start heparin drip as current INR < 2 (INR 1.8).  Goal of Therapy:  Heparin level 0.3-0.7 units/ml Monitor platelets by anticoagulation protocol: Yes   Plan:  Start heparin infusion at 700 units/hr Check anti-Xa level in 6 hours and daily while on heparin Continue to monitor H&H and platelets  Jeanella Cara, PharmD, [facility] Clinical Pharmacist Please see AMION for all Pharmacists' Contact Phone Numbers 10/14/2022, 8:10 PM

## 2022-10-15 ENCOUNTER — Encounter (HOSPITAL_COMMUNITY): Payer: Self-pay | Admitting: Family Medicine

## 2022-10-15 ENCOUNTER — Other Ambulatory Visit (HOSPITAL_COMMUNITY): Payer: Medicaid Other

## 2022-10-15 ENCOUNTER — Encounter (HOSPITAL_COMMUNITY)
Admission: EM | Disposition: A | Discharge status: Home / Self Care | Payer: Self-pay | Source: Home / Self Care | Attending: Internal Medicine

## 2022-10-15 ENCOUNTER — Other Ambulatory Visit (HOSPITAL_COMMUNITY): Payer: Self-pay | Admitting: *Deleted

## 2022-10-15 ENCOUNTER — Other Ambulatory Visit: Payer: Self-pay | Admitting: Physician Assistant

## 2022-10-15 ENCOUNTER — Inpatient Hospital Stay (HOSPITAL_COMMUNITY): Payer: Medicaid Other | Admitting: Anesthesiology

## 2022-10-15 ENCOUNTER — Inpatient Hospital Stay (HOSPITAL_COMMUNITY): Payer: Medicaid Other

## 2022-10-15 DIAGNOSIS — I4891 Unspecified atrial fibrillation: Secondary | ICD-10-CM

## 2022-10-15 DIAGNOSIS — J9611 Chronic respiratory failure with hypoxia: Secondary | ICD-10-CM | POA: Diagnosis not present

## 2022-10-15 DIAGNOSIS — S22000A Wedge compression fracture of unspecified thoracic vertebra, initial encounter for closed fracture: Secondary | ICD-10-CM

## 2022-10-15 DIAGNOSIS — I509 Heart failure, unspecified: Secondary | ICD-10-CM

## 2022-10-15 DIAGNOSIS — E876 Hypokalemia: Secondary | ICD-10-CM

## 2022-10-15 DIAGNOSIS — F418 Other specified anxiety disorders: Secondary | ICD-10-CM

## 2022-10-15 DIAGNOSIS — J449 Chronic obstructive pulmonary disease, unspecified: Secondary | ICD-10-CM

## 2022-10-15 DIAGNOSIS — I340 Nonrheumatic mitral (valve) insufficiency: Secondary | ICD-10-CM

## 2022-10-15 DIAGNOSIS — D72829 Elevated white blood cell count, unspecified: Secondary | ICD-10-CM

## 2022-10-15 HISTORY — PX: CARDIOVERSION: SHX1299

## 2022-10-15 HISTORY — PX: TEE WITHOUT CARDIOVERSION: SHX5443

## 2022-10-15 LAB — CBC WITH DIFFERENTIAL/PLATELET
Abs Immature Granulocytes: 1.1 10*3/uL — ABNORMAL HIGH (ref 0.00–0.07)
Band Neutrophils: 7 %
Basophils Absolute: 0.4 10*3/uL — ABNORMAL HIGH (ref 0.0–0.1)
Basophils Relative: 1 %
Eosinophils Absolute: 0.4 10*3/uL (ref 0.0–0.5)
Eosinophils Relative: 1 %
HCT: 29.7 % — ABNORMAL LOW (ref 36.0–46.0)
Hemoglobin: 8.9 g/dL — ABNORMAL LOW (ref 12.0–15.0)
Lymphocytes Relative: 1 %
Lymphs Abs: 0.4 10*3/uL — ABNORMAL LOW (ref 0.7–4.0)
MCH: 25.5 pg — ABNORMAL LOW (ref 26.0–34.0)
MCHC: 30 g/dL (ref 30.0–36.0)
MCV: 85.1 fL (ref 80.0–100.0)
Metamyelocytes Relative: 2 %
Monocytes Absolute: 2.3 10*3/uL — ABNORMAL HIGH (ref 0.1–1.0)
Monocytes Relative: 6 %
Myelocytes: 1 %
Neutro Abs: 33.4 10*3/uL — ABNORMAL HIGH (ref 1.7–7.7)
Neutrophils Relative %: 81 %
Platelets: 320 10*3/uL (ref 150–400)
RBC: 3.49 MIL/uL — ABNORMAL LOW (ref 3.87–5.11)
RDW: 17.7 % — ABNORMAL HIGH (ref 11.5–15.5)
WBC: 38 10*3/uL — ABNORMAL HIGH (ref 4.0–10.5)
nRBC: 0 % (ref 0.0–0.2)

## 2022-10-15 LAB — MRSA NEXT GEN BY PCR, NASAL: MRSA by PCR Next Gen: NOT DETECTED

## 2022-10-15 LAB — COMPREHENSIVE METABOLIC PANEL
ALT: 14 U/L (ref 0–44)
AST: 19 U/L (ref 15–41)
Albumin: 3.5 g/dL (ref 3.5–5.0)
Alkaline Phosphatase: 167 U/L — ABNORMAL HIGH (ref 38–126)
Anion gap: 11 (ref 5–15)
BUN: 11 mg/dL (ref 6–20)
CO2: 33 mmol/L — ABNORMAL HIGH (ref 22–32)
Calcium: 8.6 mg/dL — ABNORMAL LOW (ref 8.9–10.3)
Chloride: 93 mmol/L — ABNORMAL LOW (ref 98–111)
Creatinine, Ser: 0.73 mg/dL (ref 0.44–1.00)
GFR, Estimated: >60 mL/min (ref >60)
Glucose, Bld: 98 mg/dL (ref 70–99)
Potassium: 3 mmol/L — ABNORMAL LOW (ref 3.5–5.1)
Sodium: 137 mmol/L (ref 135–145)
Total Bilirubin: 0.7 mg/dL (ref 0.3–1.2)
Total Protein: 5.8 g/dL — ABNORMAL LOW (ref 6.5–8.1)

## 2022-10-15 LAB — URINALYSIS, ROUTINE W REFLEX MICROSCOPIC
Bilirubin Urine: NEGATIVE
Glucose, UA: NEGATIVE mg/dL
Hgb urine dipstick: NEGATIVE
Ketones, ur: NEGATIVE mg/dL
Leukocytes,Ua: NEGATIVE
Nitrite: NEGATIVE
Protein, ur: 100 mg/dL — AB
Specific Gravity, Urine: 1.023 (ref 1.005–1.030)
pH: 5 (ref 5.0–8.0)

## 2022-10-15 LAB — PROTIME-INR
INR: 1.8 — ABNORMAL HIGH (ref 0.8–1.2)
Prothrombin Time: 20.7 seconds — ABNORMAL HIGH (ref 11.4–15.2)

## 2022-10-15 LAB — ECHO TEE

## 2022-10-15 LAB — TROPONIN I (HIGH SENSITIVITY): Troponin I (High Sensitivity): 25 ng/L — ABNORMAL HIGH (ref <18)

## 2022-10-15 LAB — HIV ANTIBODY (ROUTINE TESTING W REFLEX): HIV Screen 4th Generation wRfx: NONREACTIVE

## 2022-10-15 LAB — HEPARIN LEVEL (UNFRACTIONATED)
Heparin Unfractionated: <0.1 IU/mL — ABNORMAL LOW (ref 0.30–0.70)
Heparin Unfractionated: 0.19 IU/mL — ABNORMAL LOW (ref 0.30–0.70)

## 2022-10-15 LAB — PROCALCITONIN: Procalcitonin: 0.28 ng/mL

## 2022-10-15 LAB — MAGNESIUM: Magnesium: 2.2 mg/dL (ref 1.7–2.4)

## 2022-10-15 SURGERY — ECHOCARDIOGRAM, TRANSESOPHAGEAL
Anesthesia: General

## 2022-10-15 MED ORDER — WARFARIN - PHARMACIST DOSING INPATIENT: Freq: Every day | Status: DC

## 2022-10-15 MED ORDER — WARFARIN SODIUM 2 MG PO TABS
4.0000 mg | ORAL_TABLET | Freq: Once | ORAL | Status: DC
  Filled 2022-10-15: qty 2

## 2022-10-15 MED ORDER — CHLORHEXIDINE GLUCONATE 0.12 % MT SOLN: 15.0000 mL | Freq: Once | OROMUCOSAL | Status: DC

## 2022-10-15 MED ORDER — MOMETASONE FURO-FORMOTEROL FUM 100-5 MCG/ACT IN AERO
2.0000 | INHALATION_SPRAY | Freq: Two times a day (BID) | RESPIRATORY_TRACT | Status: DC
  Administered 2022-10-15 – 2022-10-16 (×3): 2 via RESPIRATORY_TRACT
  Filled 2022-10-15: qty 8.8

## 2022-10-15 MED ORDER — ALPRAZOLAM 0.5 MG PO TABS
0.5000 mg | ORAL_TABLET | Freq: Three times a day (TID) | ORAL | Status: DC
  Administered 2022-10-15 – 2022-10-16 (×4): 0.5 mg via ORAL
  Filled 2022-10-15 (×4): qty 1

## 2022-10-15 MED ORDER — ONDANSETRON HCL 4 MG/2ML IJ SOLN: 4.0000 mg | Freq: Four times a day (QID) | INTRAMUSCULAR | Status: DC | PRN

## 2022-10-15 MED ORDER — PANTOPRAZOLE SODIUM 40 MG PO TBEC
40.0000 mg | DELAYED_RELEASE_TABLET | Freq: Every day | ORAL | Status: DC
  Administered 2022-10-15 – 2022-10-16 (×2): 40 mg via ORAL
  Filled 2022-10-15 (×2): qty 1

## 2022-10-15 MED ORDER — POTASSIUM CHLORIDE CRYS ER 20 MEQ PO TBCR
40.0000 meq | EXTENDED_RELEASE_TABLET | Freq: Two times a day (BID) | ORAL | Status: AC
  Administered 2022-10-15 (×2): 40 meq via ORAL
  Filled 2022-10-15 (×2): qty 2

## 2022-10-15 MED ORDER — WARFARIN SODIUM 1 MG PO TABS: 1.0000 mg | ORAL_TABLET | Freq: Once | ORAL | Status: DC

## 2022-10-15 MED ORDER — ASPIRIN 81 MG PO TBEC
81.0000 mg | DELAYED_RELEASE_TABLET | Freq: Every day | ORAL | Status: DC
  Administered 2022-10-15 – 2022-10-16 (×2): 81 mg via ORAL
  Filled 2022-10-15 (×2): qty 1

## 2022-10-15 MED ORDER — PROPOFOL 500 MG/50ML IV EMUL
INTRAVENOUS | Status: AC
  Filled 2022-10-15: qty 50

## 2022-10-15 MED ORDER — IPRATROPIUM-ALBUTEROL 0.5-2.5 (3) MG/3ML IN SOLN: 3.0000 mL | Freq: Two times a day (BID) | RESPIRATORY_TRACT | Status: DC | PRN

## 2022-10-15 MED ORDER — ACETAMINOPHEN 325 MG PO TABS: 650.0000 mg | ORAL_TABLET | Freq: Four times a day (QID) | ORAL | Status: DC | PRN

## 2022-10-15 MED ORDER — ENOXAPARIN SODIUM 60 MG/0.6ML IJ SOSY
50.0000 mg | PREFILLED_SYRINGE | Freq: Two times a day (BID) | INTRAMUSCULAR | Status: DC
  Administered 2022-10-15 – 2022-10-16 (×2): 50 mg via SUBCUTANEOUS
  Filled 2022-10-15 (×4): qty 0.6

## 2022-10-15 MED ORDER — OXYCODONE-ACETAMINOPHEN 7.5-325 MG PO TABS
1.0000 | ORAL_TABLET | ORAL | Status: DC | PRN
  Administered 2022-10-15 – 2022-10-16 (×4): 1 via ORAL
  Filled 2022-10-15 (×4): qty 1

## 2022-10-15 MED ORDER — GABAPENTIN 300 MG PO CAPS
300.0000 mg | ORAL_CAPSULE | Freq: Two times a day (BID) | ORAL | Status: DC
  Administered 2022-10-15 – 2022-10-16 (×3): 300 mg via ORAL
  Filled 2022-10-15 (×3): qty 1

## 2022-10-15 MED ORDER — LACTATED RINGERS IV SOLN: INTRAVENOUS | Status: DC

## 2022-10-15 MED ORDER — ACETAMINOPHEN 650 MG RE SUPP: 650.0000 mg | Freq: Four times a day (QID) | RECTAL | Status: DC | PRN

## 2022-10-15 MED ORDER — VENLAFAXINE HCL 37.5 MG PO TABS
75.0000 mg | ORAL_TABLET | Freq: Two times a day (BID) | ORAL | Status: DC
  Administered 2022-10-15 – 2022-10-16 (×4): 75 mg via ORAL
  Filled 2022-10-15 (×4): qty 2

## 2022-10-15 MED ORDER — LIDOCAINE 4 % EX CREA
TOPICAL_CREAM | Freq: Three times a day (TID) | CUTANEOUS | Status: DC | PRN
  Administered 2022-10-15 – 2022-10-16 (×2): 1 via TOPICAL
  Filled 2022-10-15: qty 5

## 2022-10-15 MED ORDER — DILTIAZEM LOAD VIA INFUSION
10.0000 mg | Freq: Once | INTRAVENOUS | Status: AC
  Administered 2022-10-15: 10 mg via INTRAVENOUS
  Filled 2022-10-15: qty 10

## 2022-10-15 MED ORDER — SODIUM CHLORIDE 0.9 % IV SOLN: INTRAVENOUS | Status: DC

## 2022-10-15 MED ORDER — BUTAMBEN-TETRACAINE-BENZOCAINE 2-2-14 % EX AERO
INHALATION_SPRAY | CUTANEOUS | Status: AC
  Filled 2022-10-15: qty 5

## 2022-10-15 MED ORDER — DILTIAZEM HCL-DEXTROSE 125-5 MG/125ML-% IV SOLN (PREMIX)
5.0000 mg/h | INTRAVENOUS | Status: DC
  Administered 2022-10-15: 5 mg/h via INTRAVENOUS
  Filled 2022-10-15: qty 125

## 2022-10-15 MED ORDER — OXYCODONE HCL 5 MG PO TABS
5.0000 mg | ORAL_TABLET | ORAL | Status: DC | PRN
  Administered 2022-10-15 (×2): 5 mg via ORAL
  Filled 2022-10-15 (×2): qty 1

## 2022-10-15 MED ORDER — ONDANSETRON HCL 4 MG PO TABS: 4.0000 mg | ORAL_TABLET | Freq: Four times a day (QID) | ORAL | Status: DC | PRN

## 2022-10-15 MED ORDER — ORAL CARE MOUTH RINSE: 15.0000 mL | Freq: Once | OROMUCOSAL | Status: DC

## 2022-10-15 MED ORDER — METOPROLOL TARTRATE 5 MG/5ML IV SOLN
5.0000 mg | Freq: Once | INTRAVENOUS | Status: AC
  Administered 2022-10-15: 5 mg via INTRAVENOUS
  Filled 2022-10-15: qty 5

## 2022-10-15 MED ORDER — GABAPENTIN 300 MG PO CAPS
600.0000 mg | ORAL_CAPSULE | Freq: Every day | ORAL | Status: DC
  Administered 2022-10-15 (×2): 600 mg via ORAL
  Filled 2022-10-15 (×2): qty 2

## 2022-10-15 MED ORDER — WARFARIN - PHYSICIAN DOSING INPATIENT: Freq: Every day | Status: DC

## 2022-10-15 MED ORDER — EPHEDRINE 5 MG/ML INJ
INTRAVENOUS | Status: AC
  Filled 2022-10-15: qty 5

## 2022-10-15 MED ORDER — ALLOPURINOL 300 MG PO TABS
300.0000 mg | ORAL_TABLET | Freq: Every day | ORAL | Status: DC
  Administered 2022-10-15: 300 mg via ORAL
  Filled 2022-10-15: qty 1

## 2022-10-15 MED ORDER — DOFETILIDE 125 MCG PO CAPS
250.0000 ug | ORAL_CAPSULE | Freq: Two times a day (BID) | ORAL | Status: DC
  Administered 2022-10-15 – 2022-10-16 (×3): 250 ug via ORAL
  Filled 2022-10-15: qty 2
  Filled 2022-10-15: qty 1
  Filled 2022-10-15 (×2): qty 2
  Filled 2022-10-15: qty 1
  Filled 2022-10-15 (×2): qty 2
  Filled 2022-10-15: qty 1
  Filled 2022-10-15: qty 2

## 2022-10-15 MED ORDER — PHENYLEPHRINE 80 MCG/ML (10ML) SYRINGE FOR IV PUSH (FOR BLOOD PRESSURE SUPPORT)
PREFILLED_SYRINGE | INTRAVENOUS | Status: AC
  Filled 2022-10-15: qty 10

## 2022-10-15 MED ORDER — PHENYLEPHRINE 80 MCG/ML (10ML) SYRINGE FOR IV PUSH (FOR BLOOD PRESSURE SUPPORT)
PREFILLED_SYRINGE | INTRAVENOUS | Status: DC | PRN
  Administered 2022-10-15 (×3): 160 ug via INTRAVENOUS

## 2022-10-15 MED ORDER — PROPOFOL 500 MG/50ML IV EMUL
INTRAVENOUS | Status: DC | PRN
  Administered 2022-10-15: 75 ug/kg/min via INTRAVENOUS

## 2022-10-15 MED ORDER — METOPROLOL SUCCINATE ER 50 MG PO TB24
50.0000 mg | ORAL_TABLET | Freq: Every day | ORAL | Status: DC
  Administered 2022-10-15 – 2022-10-16 (×2): 50 mg via ORAL
  Filled 2022-10-15 (×2): qty 1

## 2022-10-15 MED ORDER — OXYCODONE HCL 5 MG PO TABS: 10.0000 mg | ORAL_TABLET | ORAL | Status: DC | PRN

## 2022-10-15 MED ORDER — HYDROCORTISONE 1 % EX CREA
TOPICAL_CREAM | Freq: Two times a day (BID) | CUTANEOUS | Status: DC
  Filled 2022-10-15 (×2): qty 28

## 2022-10-15 MED ORDER — UMECLIDINIUM BROMIDE 62.5 MCG/ACT IN AEPB
1.0000 | INHALATION_SPRAY | Freq: Every day | RESPIRATORY_TRACT | Status: DC
  Administered 2022-10-15 – 2022-10-16 (×2): 1 via RESPIRATORY_TRACT
  Filled 2022-10-15: qty 7

## 2022-10-15 MED ORDER — POTASSIUM CHLORIDE 20 MEQ PO PACK
40.0000 meq | PACK | Freq: Once | ORAL | Status: DC
  Filled 2022-10-15: qty 2

## 2022-10-15 MED ORDER — PROPOFOL 10 MG/ML IV BOLUS
INTRAVENOUS | Status: DC | PRN
  Administered 2022-10-15: 40 mg via INTRAVENOUS
  Administered 2022-10-15: 100 mg via INTRAVENOUS

## 2022-10-15 MED ORDER — LACTATED RINGERS IV SOLN: INTRAVENOUS | Status: DC | PRN

## 2022-10-15 MED ORDER — WARFARIN SODIUM 2 MG PO TABS
4.0000 mg | ORAL_TABLET | Freq: Once | ORAL | Status: AC
  Administered 2022-10-15: 4 mg via ORAL

## 2022-10-15 MED ORDER — BUDESON-GLYCOPYRROL-FORMOTEROL 160-9-4.8 MCG/ACT IN AERO: 2.0000 | INHALATION_SPRAY | Freq: Two times a day (BID) | RESPIRATORY_TRACT | Status: DC

## 2022-10-15 NOTE — Consult Note (Signed)
Cardiology Consultation   Patient ID: Mallory Sutton MRN: [id number]; DOB: 02/10/1961  Admit date: 10/14/2022 Date of Consult: 10/15/2022  PCP:  Leone Payor, FNP   [facility] Providers Cardiologist:  Charlton Haws, MD  Electrophysiologist:  Lewayne Bunting, MD       Patient Profile:   Mallory Sutton is a 62 y.o. 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 Reports palpitations often few days after her chemo sessions. This most recent episode started after chemo early June, consistent symptoms for the last several days. Has had some nausea and vomiting, though has been able to be compliant with her meds   Regarding her PAF, previously on propafenone, later failed. She was admitted 07/14/22 for tikosyn loading. After loading was cardioverted to SR Recurrent afib since, ER visit 4/20 with elevated rates.  Followed in afib clinic, continued on tikosyn and metoprolol.     INR 1.8 K 3 BUN 11 Cr 0.68 WBC 37.4 Hgb 9.5 Plt 365 Procalc 0.28 Mg 2.2 Trop 25 EKG afib with RVR Jan 2024 echo: LVE 60-65%, no WMAs,  03/2022 echo: LVE 65-70%, no WMAs, grade II dd, aortic coarctation, AVR stable mean grad 28     Past Medical History:  Diagnosis Date   Aortic stenosis    ATRIAL ARRHYTHMIAS    Bicuspid aortic valve    CHF (congestive heart failure) (HCC)    COARCTATION OF AORTA    Dysrhythmia    ENDOMETRIOSIS    Heart murmur    Lymphoma (HCC)    Paroxysmal atrial fibrillation (HCC)    Port-A-Cath in place 08/31/2022   Shortness of breath    THORACIC AORTIC ANEURYSM    Vasculitis (HCC)     Past Surgical History:  Procedure Laterality Date    CARDIOVERSION N/A 08/29/2019   Procedure: CARDIOVERSION;  Surgeon: Antoine Poche, MD;  Location: [facility];  Service: Endoscopy;  Laterality: N/A;   CARDIOVERSION N/A 07/16/2022   Procedure: CARDIOVERSION;  Surgeon: Sande Rives, MD;  Location: [facility] ENDOSCOPY;  Service: Cardiovascular;  Laterality: N/A;   coarctation repair and residual restenosis     IR BONE MARROW BIOPSY & ASPIRATION  08/10/2022   IR IMAGING GUIDED PORT INSERTION  08/10/2022   KNEE SURGERY     LYMPH NODE BIOPSY Right 06/25/2022   Procedure: LYMPH NODE BIOPSY; right groin;  Surgeon: Lucretia Roers, MD;  Location: [facility];  Service: General;  Laterality: Right;   PORTA CATH INSERTION     TEE WITHOUT CARDIOVERSION N/A 07/12/2019   Procedure: TRANSESOPHAGEAL ECHOCARDIOGRAM (TEE);  Surgeon: Quintella Reichert, MD;  Location: [facility] ENDOSCOPY;  Service: Cardiovascular;  Laterality: N/A;   TEE WITHOUT CARDIOVERSION N/A 08/29/2019   Procedure: TRANSESOPHAGEAL ECHOCARDIOGRAM (TEE) WITH PROPOFOL;  Surgeon: Antoine Poche, MD;  Location: [facility];  Service: Endoscopy;  Laterality: N/A;      Inpatient Medications: Scheduled Meds:  ALPRAZolam  0.5 mg Oral TID   aspirin EC  81 mg Oral Q breakfast   Chlorhexidine Gluconate Cloth  6 each Topical Q0600   diltiazem  10 mg Intravenous Once   gabapentin  300 mg Oral BID AC   gabapentin  600 mg Oral QHS   hydrocortisone cream   Topical BID   metoprolol succinate  50 mg Oral Daily   mometasone-formoterol  2 puff Inhalation BID   mupirocin ointment  1 Application Nasal BID   pantoprazole  40 mg Oral Daily   potassium chloride  40 mEq Oral Once   umeclidinium bromide  1 puff Inhalation Daily   venlafaxine  75 mg Oral BID   warfarin  1 mg Oral ONCE-1600   Warfarin - Physician Dosing Inpatient   Does not apply q1600   Continuous Infusions:  diltiazem (CARDIZEM) infusion     heparin 1,000 Units/hr (10/15/22 0505)   PRN Meds: acetaminophen **OR** acetaminophen,  ipratropium-albuterol, ondansetron **OR** ondansetron (ZOFRAN) IV, oxyCODONE  Allergies:    Allergies  Allergen Reactions   Amiodarone Nausea Only   Penicillins Hives    Has patient had a PCN reaction causing immediate rash, facial/tongue/throat swelling, SOB or lightheadedness with hypotension: YES Has patient had a PCN reaction causing severe rash involving mucus membranes or skin necrosis: NO Has patient had a PCN reaction that required hospitalization NO Has patient had a PCN reaction occurring within the last 10 years: NO If all of the above answers are "NO", then may proceed with Cephalosporin use.     Social History:   Social History   Socioeconomic History   Marital status: Married    Spouse name: Not on file   Number of children: Not on file   Years of education: Not on file   Highest education level: Not on file  Occupational History   Not on file  Tobacco Use   Smoking status: Never    Passive exposure: Past   Smokeless tobacco: Never   Tobacco comments:    Never smoke 07/14/22  Vaping Use   Vaping Use: Never used  Substance and Sexual Activity   Alcohol use: Yes    Comment: 3 beers 3 nights a week 07/14/22   Drug use: No   Sexual activity: Never  Other Topics Concern   Not on file  Social History Narrative   Not on file   Social Determinants of Health   Financial Resource Strain: High Risk (07/02/2022)   Overall Financial Resource Strain (CARDIA)    Difficulty of Paying Living Expenses: Very hard  Food Insecurity: No Food Insecurity (10/15/2022)   Hunger Vital Sign    Worried About Running Out of Food in the Last Year: Never true    Ran Out of Food in the Last Year: Never true  Transportation Needs: No Transportation Needs (10/15/2022)   PRAPARE - Administrator, Civil Service (Medical): No    Lack of Transportation (Non-Medical): No  Physical Activity: Not on file  Stress: Not on file  Social Connections: Not on file  Intimate Partner  Violence: Not At Risk (10/15/2022)   Humiliation, Afraid, Rape, and Kick questionnaire    Fear of Current or Ex-Partner: No    Emotionally Abused: No    Physically Abused: No    Sexually Abused: No    Family History:    Family History  Problem Relation Age of Onset   Suicidality Mother    Mental illness Mother    Drug abuse Brother    Healthy Daughter    Healthy Son      ROS:  Please see the history of present illness.   All other ROS reviewed and negative.     Physical Exam/Data:   Vitals:   10/15/22 0600 10/15/22  0700 10/15/22 0744 10/15/22 0816  BP: 93/64 93/68    Pulse: (!) 148 (!) 150    Resp: 20 19    Temp:   98.1 F (36.7 C)   TempSrc:   Oral   SpO2: 99% 99%  98%  Weight:      Height:       No intake or output data in the 24 hours ending 10/15/22 0831    10/15/2022    4:59 AM 10/15/2022   12:00 AM 10/14/2022    5:50 PM  Last 3 Weights  Weight (lbs) 111 lb 5.3 oz 109 lb 5.6 oz 110 lb 3.7 oz  Weight (kg) 50.5 kg 49.6 kg 50 kg     Body mass index is 19.11 kg/m.  General:  Well nourished, well developed, in no acute distress HEENT: normal Neck: no JVD Vascular: No carotid bruits; Distal pulses 2+ bilaterally Cardiac:  irregular, tachy, mechanical S2 with 2/6 systolic murmur Lungs:  clear to auscultation bilaterally, no wheezing, rhonchi or rales  Abd: soft, nontender, no hepatomegaly  Ext: no edema Musculoskeletal:  No deformities, BUE and BLE strength normal and equal Skin: warm and dry  Neuro:  CNs 2-12 intact, no focal abnormalities noted Psych:  Normal affect     Laboratory Data:  High Sensitivity Troponin:   Recent Labs  Lab 10/15/22 0340  TROPONINIHS 25*     Chemistry Recent Labs  Lab 10/14/22 1826 10/15/22 0340  NA 135 137  K 3.0* 3.0*  CL 93* 93*  CO2 30 33*  GLUCOSE 110* 98  BUN 11 11  CREATININE 0.68 0.73  CALCIUM 9.0 8.6*  MG  --  2.2  GFRNONAA >60 >60  ANIONGAP 12 11    Recent Labs  Lab 10/14/22 1826 10/15/22 0340   PROT 6.3* 5.8*  ALBUMIN 3.8 3.5  AST 20 19  ALT 16 14  ALKPHOS 186* 167*  BILITOT 0.6 0.7   Lipids No results for input(s): "CHOL", "TRIG", "HDL", "LABVLDL", "LDLCALC", "CHOLHDL" in the last 168 hours.  Hematology Recent Labs  Lab 10/14/22 1826 10/15/22 0340  WBC 37.4* 38.0*  RBC 3.70* 3.49*  HGB 9.5* 8.9*  HCT 30.7* 29.7*  MCV 83.0 85.1  MCH 25.7* 25.5*  MCHC 30.9 30.0  RDW 17.5* 17.7*  PLT 365 320   Thyroid No results for input(s): "TSH", "FREET4" in the last 168 hours.  BNPNo results for input(s): "BNP", "PROBNP" in the last 168 hours.  DDimer No results for input(s): "DDIMER" in the last 168 hours.   Radiology/Studies:  DG Chest Port 1 View  Result Date: 10/14/2022 CLINICAL DATA:  Atrial fibrillation. EXAM: PORTABLE CHEST 1 VIEW COMPARISON:  Chest/rib radiographs 09/11/2022. Chest CTA 09/12/2022. FINDINGS: The patient is rotated to the left with grossly unchanged cardiomediastinal silhouette. Sequelae of aortic valve replacement and left atrial appendage clipping are again identified. A right jugular Port-A-Cath remains in place with tip projecting over the high right atrium. Lung volumes are decreased with mild interstitial prominence and mild atelectasis in the left lung base. No overt pulmonary edema, segmental airspace consolidation, sizeable pleural effusion, or pneumothorax is identified. No acute osseous abnormality is seen. IMPRESSION: Low lung volumes without evidence of acute airspace disease. Electronically Signed   By: Sebastian Ache M.D.   On: 10/14/2022 18:37     Assessment and Plan:   PAF -failed propafenone in the past - has been on dofetilide, noted break throughs over time in clinic follow ups but typically short in duration. Longer episodes  of palpitaitons typically a few days after her chemo treatments, does have some N/V after chemo but able to keep meds down. Most recent palpitations have lasted several days. K was 3.0 on admission.  -she was in SR  08/21/22 appt  -home regimen is dofetilide bid, toprol 75mg  - start dilt gtt, continue home regimen. Plan for TEE/DCCV today, requires TEE given INRs were subtherapeutic. - reports nausea on amio in the past. - long term management of afib to continue with afib cliinc and Dr Ladona Ridgel, will need close outpatient f/u    Informed Consent   Shared Decision Making/Informed Consent{  The risks [stroke, cardiac arrhythmias rarely resulting in the need for a temporary or permanent pacemaker, skin irritation or burns, esophageal damage, perforation (1:10,000 risk), bleeding, pharyngeal hematoma as well as other potential complications associated with conscious sedation including aspiration, arrhythmia, respiratory failure and death], benefits (treatment guidance, restoration of normal sinus rhythm, diagnostic support) and alternatives of a transesophageal echocardiogram guided cardioversion were discussed in detail with Ms. Blankenbeckler and she is willing to proceed.      2.History of mechanical AVR - 2016 19mm on X valve - on coumadin at home along with ASA - INR subtherapetic on admission, started on hep gtt  3.Lymphoma - followed by onc, on chemo     For questions or updates, please contact [facility] Please consult [email or web address] for contact info under    Signed, [name], MD  10/15/2022 8:31 AM

## 2022-10-15 NOTE — Assessment & Plan Note (Signed)
-   Patient reports a diagnosis of COPD that she is not convinced of - Continue Breztri, DuoNeb - Continue oxygen supplementation as indicated - Continue to monitor

## 2022-10-15 NOTE — Anesthesia Procedure Notes (Signed)
Date/Time: 10/15/2022 2:41 PM  Performed by: Julian Reil, CRNAPre-anesthesia Checklist: Patient identified, Emergency Drugs available, Suction available and Patient being monitored Patient Re-evaluated:Patient Re-evaluated prior to induction Oxygen Delivery Method: Nasal cannula Induction Type: IV induction Placement Confirmation: positive ETCO2

## 2022-10-15 NOTE — Progress Notes (Signed)
*  PRELIMINARY RESULTS* Echocardiogram Echocardiogram Transesophageal has been performed.  Stacey Drain 10/15/2022, 3:19 PM

## 2022-10-15 NOTE — TOC Initial Note (Signed)
Transition of Care [facility]) - Initial/Assessment Note    Patient Details  Name: Mallory Sutton MRN: [id number] Date of Birth: Oct 01, 1960  Transition of Care [facility]) CM/SW Contact:    Annice Needy, LCSW Phone Number: 10/15/2022, 3:40 PM  Clinical Narrative:                 Patient from home with spouse and son. Admitted for Afib with RVR. Considered high risk for readmission. Independent with ADLs and ambulation. Has oxygen provided by [employer] (2-3L) and nebulizer. Daughter and spouse take to appointments.   Expected Discharge Plan: Home/Self Care Barriers to Discharge: Continued Medical Work up   Patient Goals and CMS Choice Patient states their goals for this hospitalization and ongoing recovery are:: return home          Expected Discharge Plan and Services       Living arrangements for the past 2 months: Single Family Home                                      Prior Living Arrangements/Services Living arrangements for the past 2 months: Single Family Home Lives with:: Adult Children, Spouse Patient language and need for interpreter reviewed:: Yes Do you feel safe going back to the place where you live?: Yes      Need for Family Participation in Patient Care: Yes (Comment) Care giver support system in place?: Yes (comment) Current home services: DME (oxygen provided by [employer] (2-3L) and nebulizer) Criminal Activity/Legal Involvement Pertinent to Current Situation/Hospitalization: No - Comment as needed  Activities of Daily Living Home Assistive Devices/Equipment: None ADL Screening (condition at time of admission) Patient's cognitive ability adequate to safely complete daily activities?: Yes Is the patient deaf or have difficulty hearing?: No Does the patient have difficulty seeing, even when wearing glasses/contacts?: No Does the patient have difficulty concentrating, remembering, or making decisions?: No Patient able to express need for assistance with  ADLs?: Yes Does the patient have difficulty dressing or bathing?: No Independently performs ADLs?: Yes (appropriate for developmental age) Does the patient have difficulty walking or climbing stairs?: Yes Weakness of Legs: Both Weakness of Arms/Hands: None  Permission Sought/Granted Permission sought to share information with : Family Supports    Share Information with NAME: Mr. Fahr, spouse           Emotional Assessment     Affect (typically observed): Appropriate Orientation: : Oriented to Self, Oriented to Place, Oriented to  Time, Oriented to Situation Alcohol / Substance Use: Not Applicable Psych Involvement: No (comment)  Admission diagnosis:  Hypokalemia [E87.6] Atrial fibrillation with rapid ventricular response (HCC) [I48.91] Atrial fibrillation with RVR (HCC) [I48.91] Lymphoma, unspecified body region, unspecified lymphoma type (HCC) [C85.90] Patient Active Problem List   Diagnosis Date Noted   Port-A-Cath in place 08/31/2022   Hypercoagulable state due to persistent atrial fibrillation (HCC) 07/14/2022   Persistent atrial fibrillation (HCC) 07/14/2022   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   Adenopathy 06/24/2022   Generalized lymphadenopathy 06/23/2022   Chronic respiratory failure with hypoxia (HCC) 06/23/2022   Hearing loss 06/03/2022   Numbness    Hypocalcemia    GERD (gastroesophageal reflux disease) 11/09/2021   Leukocytosis 11/09/2021   Protein-calorie malnutrition, severe (HCC) 07/19/2021   Acute respiratory failure with hypoxia (HCC) 07/17/2021   Hypokalemia 07/17/2021   Anxiety and depression 07/17/2021   Chronic diastolic CHF (congestive heart failure) (HCC) 07/17/2021  COPD (chronic obstructive pulmonary disease) (HCC) 07/16/2021   Vasculitis (HCC) 12/31/2020   Bilateral hand pain 12/31/2020   Benign skin lesion of thigh 10/15/2020   Atrial flutter (HCC)    Acute diastolic heart failure (HCC) 08/23/2014   Pleural effusion 08/23/2014    Pleural effusion, bilateral 08/23/2014   Dyspnea    Acute respiratory failure (HCC) 08/22/2014   Atrial fibrillation with RVR (HCC) 08/06/2014   Encounter for therapeutic drug monitoring 08/06/2014   S/P aortic valve replacement-07/25/14 at [facility] 08/06/2014   Shortness of breath 04/23/2010   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 07/25/14 [facility] 03/12/2010   Aortic valve disorder 09/12/2009   ENDOMETRIOSIS 09/12/2009   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 09/12/2009   Paroxysmal atrial fibrillation (HCC) 07/18/2008   BICUSPID AORTIC VALVE 07/18/2008   PCP:  Leone Payor, FNP Pharmacy:   [address] [facility] - [place], [place] - [phone] [place] [address] Phone: [phone] Fax: [phone]  [facility] [address] [place] [address] Phone: [phone] Fax: [phone]  [id number] [place], [place] - [address] AT [facility] [address] [place] [address] Phone: [phone] Fax: [phone]     Social Determinants of Health (SDOH) Social History: SDOH Screenings   Food Insecurity: No Food Insecurity (10/15/2022)  Housing: Patient Declined (10/15/2022)  Transportation Needs: No Transportation Needs (10/15/2022)  Utilities: Not At Risk (10/15/2022)  Depression (PHQ2-9): Medium Risk (06/23/2022)  Financial Resource Strain: High Risk (07/02/2022)  Tobacco Use: Low Risk  (10/15/2022)   SDOH Interventions:     Readmission Risk Interventions     No data to display

## 2022-10-15 NOTE — H&P (Signed)
Procedue H&P   Patient presents for transesophageal echocardiogram and electrical cardioversion in the setting of atrial fibrillation. Plan for procedure today with the assistance of anesthesiology. For full medical history please refer to referenced clinical note below  Mallory Rich MD   Cardiology Consultation    Patient ID: Mallory Sutton MRN: [id number]; DOB: [birth year]   Admit date: 10/14/2022 Date of Consult: 10/15/2022   PCP:  Leone Payor, FNP              [facility] Providers Cardiologist:  Charlton Haws, MD  Electrophysiologist:  Lewayne Bunting, MD         Patient Profile:    Mallory Sutton is a 62 y.o. 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 Reports palpitations often few days after her chemo sessions. This most recent episode started after chemo early June, consistent symptoms for the last several days. Has had some nausea and vomiting, though has been able to be compliant with her meds     Regarding her PAF, previously on propafenone, later failed. She was admitted 07/14/22 for tikosyn loading. After loading was cardioverted to SR Recurrent afib since, ER visit 4/20 with elevated rates.  Followed in afib clinic, continued on tikosyn and metoprolol.        INR 1.8 K 3 BUN 11 Cr 0.68 WBC 37.4 Hgb 9.5 Plt 365 Procalc 0.28 Mg 2.2 Trop 25 EKG afib with RVR Jan 2024 echo: LVE 60-65%, no WMAs,  03/2022 echo: LVE 65-70%, no WMAs, grade II dd, aortic coarctation, AVR stable mean grad 28             Past Medical History:  Diagnosis Date   Aortic stenosis     ATRIAL ARRHYTHMIAS     Bicuspid aortic valve     CHF (congestive heart  failure) (HCC)     COARCTATION OF AORTA     Dysrhythmia     ENDOMETRIOSIS     Heart murmur     Lymphoma (HCC)     Paroxysmal atrial fibrillation (HCC)     Port-A-Cath in place 08/31/2022   Shortness of breath     THORACIC AORTIC ANEURYSM     Vasculitis (HCC)             Past Surgical History:  Procedure Laterality Date   CARDIOVERSION N/A 08/29/2019    Procedure: CARDIOVERSION;  Surgeon: Antoine Poche, MD;  Location: [facility];  Service: Endoscopy;  Laterality: N/A;   CARDIOVERSION N/A 07/16/2022    Procedure: CARDIOVERSION;  Surgeon: Sande Rives, MD;  Location: [facility] ENDOSCOPY;  Service: Cardiovascular;  Laterality: N/A;   coarctation repair and residual restenosis       IR BONE MARROW BIOPSY & ASPIRATION   08/10/2022   IR IMAGING GUIDED PORT INSERTION   08/10/2022   KNEE SURGERY       LYMPH NODE BIOPSY Right 06/25/2022    Procedure: LYMPH NODE BIOPSY; right groin;  Surgeon: Lucretia Roers, MD;  Location: [facility];  Service: General;  Laterality: Right;   PORTA CATH INSERTION       TEE WITHOUT CARDIOVERSION N/A 07/12/2019    Procedure:  TRANSESOPHAGEAL ECHOCARDIOGRAM (TEE);  Surgeon: Quintella Reichert, MD;  Location: [facility] ENDOSCOPY;  Service: Cardiovascular;  Laterality: N/A;   TEE WITHOUT CARDIOVERSION N/A 08/29/2019    Procedure: TRANSESOPHAGEAL ECHOCARDIOGRAM (TEE) WITH PROPOFOL;  Surgeon: Antoine Poche, MD;  Location: [facility];  Service: Endoscopy;  Laterality: N/A;        Inpatient Medications: Scheduled Meds:  ALPRAZolam  0.5 mg Oral TID   aspirin EC  81 mg Oral Q breakfast   Chlorhexidine Gluconate Cloth  6 each Topical Q0600   diltiazem  10 mg Intravenous Once   gabapentin  300 mg Oral BID AC   gabapentin  600 mg Oral QHS   hydrocortisone cream   Topical BID   metoprolol succinate  50 mg Oral Daily   mometasone-formoterol  2 puff Inhalation BID   mupirocin ointment  1 Application Nasal BID   pantoprazole  40 mg Oral Daily   potassium chloride  40 mEq  Oral Once   umeclidinium bromide  1 puff Inhalation Daily   venlafaxine  75 mg Oral BID   warfarin  1 mg Oral ONCE-1600   Warfarin - Physician Dosing Inpatient   Does not apply q1600    Continuous Infusions:  diltiazem (CARDIZEM) infusion     heparin 1,000 Units/hr (10/15/22 0505)    PRN Meds: acetaminophen **OR** acetaminophen, ipratropium-albuterol, ondansetron **OR** ondansetron (ZOFRAN) IV, oxyCODONE   Allergies:         Allergies  Allergen Reactions   Amiodarone Nausea Only   Penicillins Hives      Has patient had a PCN reaction causing immediate rash, facial/tongue/throat swelling, SOB or lightheadedness with hypotension: YES Has patient had a PCN reaction causing severe rash involving mucus membranes or skin necrosis: NO Has patient had a PCN reaction that required hospitalization NO Has patient had a PCN reaction occurring within the last 10 years: NO If all of the above answers are "NO", then may proceed with Cephalosporin use.        Social History:   Social History         Socioeconomic History   Marital status: Married      Spouse name: Not on file   Number of children: Not on file   Years of education: Not on file   Highest education level: Not on file  Occupational History   Not on file  Tobacco Use   Smoking status: Never      Passive exposure: Past   Smokeless tobacco: Never   Tobacco comments:      Never smoke 07/14/22  Vaping Use   Vaping Use: Never used  Substance and Sexual Activity   Alcohol use: Yes      Comment: 3 beers 3 nights a week 07/14/22   Drug use: No   Sexual activity: Never  Other Topics Concern   Not on file  Social History Narrative   Not on file    Social Determinants of Health        Financial Resource Strain: High Risk (07/02/2022)    Overall Financial Resource Strain (CARDIA)     Difficulty of Paying Living Expenses: Very hard  Food Insecurity: No Food Insecurity (10/15/2022)    Hunger Vital Sign     Worried About  Running Out of Food in the Last Year: Never true     Ran Out of Food in the Last Year: Never true  Transportation Needs: No Transportation Needs (10/15/2022)    PRAPARE - Transportation     Lack  of Transportation (Medical): No     Lack of Transportation (Non-Medical): No  Physical Activity: Not on file  Stress: Not on file  Social Connections: Not on file  Intimate Partner Violence: Not At Risk (10/15/2022)    Humiliation, Afraid, Rape, and Kick questionnaire     Fear of Current or Ex-Partner: No     Emotionally Abused: No     Physically Abused: No     Sexually Abused: No    Family History:          Family History  Problem Relation Age of Onset   Suicidality Mother     Mental illness Mother     Drug abuse Brother     Healthy Daughter     Healthy Son        ROS:  Please see the history of present illness.    All other ROS reviewed and negative.      Physical Exam/Data:          Vitals:    10/15/22 0600 10/15/22 0700 10/15/22 0744 10/15/22 0816  BP: 93/64 93/68      Pulse: (!) 148 (!) 150      Resp: 20 19      Temp:     98.1 F (36.7 C)    TempSrc:     Oral    SpO2: 99% 99%   98%  Weight:          Height:            No intake or output data in the 24 hours ending 10/15/22 0831     10/15/2022    4:59 AM 10/15/2022   12:00 AM 10/14/2022    5:50 PM  Last 3 Weights  Weight (lbs) 111 lb 5.3 oz 109 lb 5.6 oz 110 lb 3.7 oz  Weight (kg) 50.5 kg 49.6 kg 50 kg     Body mass index is 19.11 kg/m.  General:  Well nourished, well developed, in no acute distress HEENT: normal Neck: no JVD Vascular: No carotid bruits; Distal pulses 2+ bilaterally Cardiac:  irregular, tachy, mechanical S2 with 2/6 systolic murmur Lungs:  clear to auscultation bilaterally, no wheezing, rhonchi or rales  Abd: soft, nontender, no hepatomegaly  Ext: no edema Musculoskeletal:  No deformities, BUE and BLE strength normal and equal Skin: warm and dry  Neuro:  CNs 2-12 intact, no focal  abnormalities noted Psych:  Normal affect        Laboratory Data:   High Sensitivity Troponin:   Last Labs     Recent Labs  Lab 10/15/22 0340  TROPONINIHS 25*       Chemistry Last Labs      Recent Labs  Lab 10/14/22 1826 10/15/22 0340  NA 135 137  K 3.0* 3.0*  CL 93* 93*  CO2 30 33*  GLUCOSE 110* 98  BUN 11 11  CREATININE 0.68 0.73  CALCIUM 9.0 8.6*  MG  --  2.2  GFRNONAA >60 >60  ANIONGAP 12 11      Last Labs      Recent Labs  Lab 10/14/22 1826 10/15/22 0340  PROT 6.3* 5.8*  ALBUMIN 3.8 3.5  AST 20 19  ALT 16 14  ALKPHOS 186* 167*  BILITOT 0.6 0.7      Lipids  Last Labs  No results for input(s): "CHOL", "TRIG", "HDL", "LABVLDL", "LDLCALC", "CHOLHDL" in the last 168 hours.    Hematology Last Labs      Recent Labs  Lab 10/14/22  1826 10/15/22 0340  WBC 37.4* 38.0*  RBC 3.70* 3.49*  HGB 9.5* 8.9*  HCT 30.7* 29.7*  MCV 83.0 85.1  MCH 25.7* 25.5*  MCHC 30.9 30.0  RDW 17.5* 17.7*  PLT 365 320      Thyroid  Last Labs  No results for input(s): "TSH", "FREET4" in the last 168 hours.    BNP Last Labs  No results for input(s): "BNP", "PROBNP" in the last 168 hours.    DDimer  Last Labs  No results for input(s): "DDIMER" in the last 168 hours.       Radiology/Studies:  DG Chest Port 1 View   Result Date: 10/14/2022 CLINICAL DATA:  Atrial fibrillation. EXAM: PORTABLE CHEST 1 VIEW COMPARISON:  Chest/rib radiographs 09/11/2022. Chest CTA 09/12/2022. FINDINGS: The patient is rotated to the left with grossly unchanged cardiomediastinal silhouette. Sequelae of aortic valve replacement and left atrial appendage clipping are again identified. A right jugular Port-A-Cath remains in place with tip projecting over the high right atrium. Lung volumes are decreased with mild interstitial prominence and mild atelectasis in the left lung base. No overt pulmonary edema, segmental airspace consolidation, sizeable pleural effusion, or pneumothorax is  identified. No acute osseous abnormality is seen. IMPRESSION: Low lung volumes without evidence of acute airspace disease. Electronically Signed   By: Sebastian Ache M.D.   On: 10/14/2022 18:37       Assessment and Plan:    PAF -failed propafenone in the past - has been on dofetilide, noted break throughs over time in clinic follow ups but typically short in duration. Longer episodes of palpitaitons typically a few days after her chemo treatments, does have some N/V after chemo but able to keep meds down. Most recent palpitations have lasted several days. K was 3.0 on admission.  -she was in SR 08/21/22 appt   -home regimen is dofetilide bid, toprol 75mg  - start dilt gtt, continue home regimen. Plan for TEE/DCCV today, requires TEE given INRs were subtherapeutic. - reports nausea on amio in the past. - long term management of afib to continue with afib cliinc and Dr Ladona Ridgel, will need close outpatient f/u      Informed Consent   Shared Decision Making/Informed Consent{   The risks [stroke, cardiac arrhythmias rarely resulting in the need for a temporary or permanent pacemaker, skin irritation or burns, esophageal damage, perforation (1:10,000 risk), bleeding, pharyngeal hematoma as well as other potential complications associated with conscious sedation including aspiration, arrhythmia, respiratory failure and death], benefits (treatment guidance, restoration of normal sinus rhythm, diagnostic support) and alternatives of a transesophageal echocardiogram guided cardioversion were discussed in detail with Ms. Hochstetler and she is willing to proceed.        2.History of mechanical AVR - 2016 19mm on X valve - on coumadin at home along with ASA - INR subtherapetic on admission, started on hep gtt   3.Lymphoma - followed by onc, on chemo         For questions or updates, please contact [facility] Please consult [email or web address] for contact info under      Signed, Mallory Rich, MD  10/15/2022 8:31 AM

## 2022-10-15 NOTE — Progress Notes (Signed)
ANTICOAGULATION CONSULT NOTE   Pharmacy Consult for Lovenox/Coumadin Indication: atrial fibrillation/AVR  Brief A/P: Heparin level subtherapeutic Increase Heparin rate  Allergies  Allergen Reactions   Amiodarone Nausea Only   Penicillins Hives    Has patient had a PCN reaction causing immediate rash, facial/tongue/throat swelling, SOB or lightheadedness with hypotension: YES Has patient had a PCN reaction causing severe rash involving mucus membranes or skin necrosis: NO Has patient had a PCN reaction that required hospitalization NO Has patient had a PCN reaction occurring within the last 10 years: NO If all of the above answers are "NO", then may proceed with Cephalosporin use.     Patient Measurements: Height: 5\' 4"  (162.6 cm) Weight: 50.5 kg (111 lb 5.3 oz) IBW/kg (Calculated) : 54.7 Heparin Dosing Weight: 50 kg  Vital Signs: Temp: 98.1 F (36.7 C) (06/13 1417) Temp Source: Oral (06/13 1417) BP: 96/79 (06/13 1417) Pulse Rate: 122 (06/13 1417)  Labs: Recent Labs    10/12/22 1515 10/14/22 1826 10/15/22 0340 10/15/22 1403  HGB  --  9.5* 8.9*  --   HCT  --  30.7* 29.7*  --   PLT  --  365 320  --   LABPROT  --  21.0* 20.7*  --   INR 4.9* 1.8* 1.8*  --   HEPARINUNFRC  --   --  <0.10* 0.19*  CREATININE  --  0.68 0.73  --   TROPONINIHS  --   --  25*  --      Estimated Creatinine Clearance: 59.6 mL/min (by C-G formula based on SCr of 0.73 mg/dL).  Assessment:   62 y.o. 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 Pt holding coumadin d/t elevated INR a few days ago.  Possible Cardioverison today. Continue Coumadin per MD, pharmacy to dose.  Patient successfully converted. MD okay to transition heparin to lovenox to bridge Coumadin until INR > or = 2 INR 1.8  Anticoag clinic adjusted dose to 3mg  on Tue, Thu, Sat, then 4mg  all other days on 10/12/22  Goal of Therapy:  INR 2-3 Monitor platelets by anticoagulation protocol: Yes   Plan:   D/C heparin Lovenox 1mg /kg (50mg ) sq q12h Coumadin 4mg  po x 1 today PT-INR daily CBC daily and monitor for bleeding  Elder Cyphers, BS Pharm D, BCPS Clinical Pharmacist 10/15/2022, 3:10 PM

## 2022-10-15 NOTE — Assessment & Plan Note (Signed)
-   Potassium is 3.0 - 40 mEq potassium given in the ED - Potassium is again 3.0 in the a.m. - Magnesium is normal at 2.2 - Give another 40 mEq of potassium - Continue to monitor on telemetry

## 2022-10-15 NOTE — Progress Notes (Signed)
PROGRESS NOTE    Mallory FERNER  Sutton:[id number] DOB: 05/16/1960 DOA: 10/14/2022 PCP: Leone Payor, FNP   Brief Narrative:    Mallory Sutton is a 61 y.o. female with medical history significant of CHF, Heart murmur, PAF, chronic respiratory failure and more presents to the ED with a chief complaint of palpitations. Patient reports that she has just had her second round of chemo. After chemo, she has developed palpitations for both rounds. She called cardiology who recommended that she comes in to the ED for eval. She has been admitted with atrial fibrillation with RVR and has undergone successful TEE/cardioversion this afternoon.  Assessment & Plan:   Principal Problem:   Atrial fibrillation with RVR (HCC) Active Problems:   Hypokalemia   Leukocytosis   Chronic respiratory failure with hypoxia (HCC)  Assessment and Plan:  Atrial fibrillation with RVR (HCC)-now sinus rhythm - A fib with RVR -Subtherapeutic INR -Patient has been holding coumadin at the recommendation of her coumadin RN -Cards consulted and advised heparin with plan for possible cardioversion today -Patient has undergone successful TEE/DCCV 6/13   Hypokalemia - Potassium is 3.0 - 40 mEq potassium given in the ED - Potassium is again 3.0 in the a.m. - Magnesium is normal at 2.2 - Give another 40 mEq of potassium - Continue to monitor on telemetry   Chronic respiratory failure with hypoxia (HCC) - Patient reports a diagnosis of COPD that she is not convinced of - Continue Breztri, DuoNeb - Continue oxygen supplementation as indicated - Continue to monitor   Leukocytosis - Leukocytosis of 37.4 - Has been as high as 55.2 in May - Likely related to oncology -patient has stage IV follicular lymphoma grade 1 and is undergoing chemo - Procalcitonin is not indicative of acute bacterial infection - No infectious symptoms - Trend in the a.m.   DVT prophylaxis:Heparin infusion Code Status: Full Family  Communication: None at bedside Disposition Plan: Admit for cardioversion Status is: Inpatient Remains inpatient appropriate because: Need for IV medications and close monitoring   Consultants:  Cardiology  Procedures:  TEE/DCCV 6/13  Antimicrobials:  None   Subjective: Patient seen and evaluated today with ongoing palpitations and elevated heart rate.  Objective: Vitals:   10/15/22 0500 10/15/22 0600 10/15/22 0700 10/15/22 0744  BP: 96/71 93/64 93/68    Pulse: (!) 140 (!) 148 (!) 150   Resp: (!) 33 20 19   Temp:    98.1 F (36.7 C)  TempSrc:    Oral  SpO2: 100% 99% 99%   Weight:      Height:       No intake or output data in the 24 hours ending 10/15/22 0809 Filed Weights   10/14/22 1750 10/15/22 0000 10/15/22 0459  Weight: 50 kg 49.6 kg 50.5 kg    Examination:  General exam: Appears calm and comfortable  Respiratory system: Clear to auscultation. Respiratory effort normal.  Nasal cannula Cardiovascular system: S1 & S2 heard, irregular and tachycardic Gastrointestinal system: Abdomen is soft Central nervous system: Alert and awake Extremities: No edema Skin: No significant lesions noted Psychiatry: Flat affect.    Data Reviewed: I have personally reviewed following labs and imaging studies  CBC: Recent Labs  Lab 10/14/22 1826 10/15/22 0340  WBC 37.4* 38.0*  NEUTROABS 31.0* 33.4*  HGB 9.5* 8.9*  HCT 30.7* 29.7*  MCV 83.0 85.1  PLT 365 320   Basic Metabolic Panel: Recent Labs  Lab 10/14/22 1826 10/15/22 0340  NA 135 137  K 3.0* 3.0*  CL 93* 93*  CO2 30 33*  GLUCOSE 110* 98  BUN 11 11  CREATININE 0.68 0.73  CALCIUM 9.0 8.6*  MG  --  2.2   GFR: Estimated Creatinine Clearance: 59.6 mL/min (by C-G formula based on SCr of 0.73 mg/dL). Liver Function Tests: Recent Labs  Lab 10/14/22 1826 10/15/22 0340  AST 20 19  ALT 16 14  ALKPHOS 186* 167*  BILITOT 0.6 0.7  PROT 6.3* 5.8*  ALBUMIN 3.8 3.5   No results for input(s): "LIPASE",  "AMYLASE" in the last 168 hours. No results for input(s): "AMMONIA" in the last 168 hours. Coagulation Profile: Recent Labs  Lab 10/12/22 1515 10/14/22 1826 10/15/22 0340  INR 4.9* 1.8* 1.8*   Cardiac Enzymes: No results for input(s): "CKTOTAL", "CKMB", "CKMBINDEX", "TROPONINI" in the last 168 hours. BNP (last 3 results) No results for input(s): "PROBNP" in the last 8760 hours. HbA1C: No results for input(s): "HGBA1C" in the last 72 hours. CBG: No results for input(s): "GLUCAP" in the last 168 hours. Lipid Profile: No results for input(s): "CHOL", "HDL", "LDLCALC", "TRIG", "CHOLHDL", "LDLDIRECT" in the last 72 hours. Thyroid Function Tests: No results for input(s): "TSH", "T4TOTAL", "FREET4", "T3FREE", "THYROIDAB" in the last 72 hours. Anemia Panel: No results for input(s): "VITAMINB12", "FOLATE", "FERRITIN", "TIBC", "IRON", "RETICCTPCT" in the last 72 hours. Sepsis Labs: Recent Labs  Lab 10/14/22 1826  PROCALCITON 0.28    No results found for this or any previous visit (from the past 240 hour(s)).       Radiology Studies: DG Chest Port 1 View  Result Date: 10/14/2022 CLINICAL DATA:  Atrial fibrillation. EXAM: PORTABLE CHEST 1 VIEW COMPARISON:  Chest/rib radiographs 09/11/2022. Chest CTA 09/12/2022. FINDINGS: The patient is rotated to the left with grossly unchanged cardiomediastinal silhouette. Sequelae of aortic valve replacement and left atrial appendage clipping are again identified. A right jugular Port-A-Cath remains in place with tip projecting over the high right atrium. Lung volumes are decreased with mild interstitial prominence and mild atelectasis in the left lung base. No overt pulmonary edema, segmental airspace consolidation, sizeable pleural effusion, or pneumothorax is identified. No acute osseous abnormality is seen. IMPRESSION: Low lung volumes without evidence of acute airspace disease. Electronically Signed   By: Sebastian Ache M.D.   On: 10/14/2022 18:37         Scheduled Meds:  ALPRAZolam  0.5 mg Oral TID   aspirin EC  81 mg Oral Q breakfast   Chlorhexidine Gluconate Cloth  6 each Topical Q0600   gabapentin  300 mg Oral BID AC   gabapentin  600 mg Oral QHS   hydrocortisone cream   Topical BID   metoprolol succinate  50 mg Oral Daily   mometasone-formoterol  2 puff Inhalation BID   mupirocin ointment  1 Application Nasal BID   pantoprazole  40 mg Oral Daily   potassium chloride  40 mEq Oral Once   umeclidinium bromide  1 puff Inhalation Daily   venlafaxine  75 mg Oral BID   warfarin  1 mg Oral ONCE-1600   Warfarin - Physician Dosing Inpatient   Does not apply q1600   Continuous Infusions:  heparin 1,000 Units/hr (10/15/22 0505)     LOS: 1 day    Time spent: 35 minutes    Ruble Buttler D Sherryll Burger, DO [facility]  If 7PM-7AM, please contact night-coverage [email or web address] 10/15/2022, 8:09 AM

## 2022-10-15 NOTE — Anesthesia Preprocedure Evaluation (Addendum)
Anesthesia Evaluation  Patient identified by MRN, date of birth, ID band Patient awake    Reviewed: Allergy & Precautions, H&P , NPO status , Patient's Chart, lab work & pertinent test results  Airway Mallampati: III  TM Distance: >3 FB Neck ROM: Full    Dental  (+) Dental Advisory Given, Poor Dentition, Chipped, Missing   Pulmonary shortness of breath and with exertion, COPD,  COPD inhaler and oxygen dependent   Pulmonary exam normal breath sounds clear to auscultation       Cardiovascular Exercise Tolerance: Poor +CHF  + dysrhythmias Atrial Fibrillation + Valvular Problems/Murmurs (AVR) AS  Rhythm:Irregular Rate:Tachycardia + Systolic murmurs    Neuro/Psych  PSYCHIATRIC DISORDERS Anxiety Depression    negative neurological ROS     GI/Hepatic Neg liver ROS,GERD  Medicated and Controlled,,  Endo/Other  negative endocrine ROS    Renal/GU negative Renal ROS  negative genitourinary   Musculoskeletal negative musculoskeletal ROS (+)    Abdominal   Peds negative pediatric ROS (+)  Hematology negative hematology ROS (+)   Anesthesia Other Findings   Reproductive/Obstetrics negative OB ROS                             Anesthesia Physical Anesthesia Plan  ASA: 4  Anesthesia Plan: General   Post-op Pain Management: Minimal or no pain anticipated   Induction: Intravenous  PONV Risk Score and Plan: 1 and Propofol infusion  Airway Management Planned: Nasal Cannula and Natural Airway  Additional Equipment:   Intra-op Plan:   Post-operative Plan:   Informed Consent: I have reviewed the patients History and Physical, chart, labs and discussed the procedure including the risks, benefits and alternatives for the proposed anesthesia with the patient or authorized representative who has indicated his/her understanding and acceptance.       Plan Discussed with: CRNA and  Surgeon  Anesthesia Plan Comments:         Anesthesia Quick Evaluation

## 2022-10-15 NOTE — Progress Notes (Signed)
ANTICOAGULATION CONSULT NOTE   Pharmacy Consult for Heparin/Coumadin Indication: atrial fibrillation/AVR  Brief A/P: Heparin level subtherapeutic Increase Heparin rate  Allergies  Allergen Reactions   Amiodarone Nausea Only   Penicillins Hives    Has patient had a PCN reaction causing immediate rash, facial/tongue/throat swelling, SOB or lightheadedness with hypotension: YES Has patient had a PCN reaction causing severe rash involving mucus membranes or skin necrosis: NO Has patient had a PCN reaction that required hospitalization NO Has patient had a PCN reaction occurring within the last 10 years: NO If all of the above answers are "NO", then may proceed with Cephalosporin use.     Patient Measurements: Height: 5\' 4"  (162.6 cm) Weight: 50.5 kg (111 lb 5.3 oz) IBW/kg (Calculated) : 54.7 Heparin Dosing Weight: 50 kg  Vital Signs: Temp: 98.1 F (36.7 C) (06/13 0744) Temp Source: Oral (06/13 0744) BP: 93/68 (06/13 0700) Pulse Rate: 150 (06/13 0700)  Labs: Recent Labs    10/12/22 1515 10/14/22 1826 10/15/22 0340  HGB  --  9.5* 8.9*  HCT  --  30.7* 29.7*  PLT  --  365 320  LABPROT  --  21.0* 20.7*  INR 4.9* 1.8* 1.8*  HEPARINUNFRC  --   --  <0.10*  CREATININE  --  0.68 0.73  TROPONINIHS  --   --  25*     Estimated Creatinine Clearance: 59.6 mL/min (by C-G formula based on SCr of 0.73 mg/dL).  Assessment:   62 y.o. 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 Pt holding coumadin d/t elevated INR a few days ago.  Possible Cardioverison today. Continue Coumadin per MD, pharmacy to dose  INR1.8 Anticoag clinic adjusted dose to 3mg  on Tue, Thu, Sat, then 4mg  all other days on 10/12/22  Goal of Therapy:  Heparin level 0.3-0.7 units/ml Monitor platelets by anticoagulation protocol: Yes   Plan:  Continue Heparin 1000 units/hr Check heparin level in 8 hours and daily heparin until INR therapeutic Coumadin 4mg  po x 1 today PT-INR daily CBC  daily and monitor for bleeding  Elder Cyphers, BS Pharm D, BCPS Clinical Pharmacist 10/15/2022, 8:55 AM

## 2022-10-15 NOTE — Transfer of Care (Addendum)
Immediate Anesthesia Transfer of Care Note  Patient: Mallory Sutton  Procedure(s) Performed: TRANSESOPHAGEAL ECHOCARDIOGRAM (TEE) CARDIOVERSION  Patient Location: PACU  Anesthesia Type:General  Level of Consciousness: drowsy and patient cooperative  Airway & Oxygen Therapy: Patient Spontanous Breathing and Patient connected to nasal cannula oxygen  Post-op Assessment: Report given to RN and Post -op Vital signs reviewed and stable  Post vital signs: Reviewed and stable  Last Vitals:  Vitals Value Taken Time  BP 90/36 10/15/2022    1521  Temp 36.7 10/15/2022    1521  Pulse 51 10/15/2022    1521  Resp 18 10/15/2022    1521  SpO2 100% 10/15/2022    1521    Last Pain:  Vitals:   10/15/22 1417  TempSrc: Oral  PainSc: 6       Patients Stated Pain Goal: 3 (10/15/22 1417)  Complications: No notable events documented.

## 2022-10-15 NOTE — H&P (Signed)
History and Physical    Patient: Mallory Sutton ZOX:[id number] DOB: May 25, 1960 DOA: 10/14/2022 DOS: the patient was seen and examined on 10/15/2022 PCP: Leone Payor, FNP  Patient coming from: Home  Chief Complaint:  Chief Complaint  Patient presents with   Palpitations   HPI: Mallory Sutton is a 62 y.o. female with medical history significant of CHF, Heart murmur, PAF, chronic respiratory failure and more presents to the ED with a chief complaint of palpitations. Patient reports that she has just had her second round of chemo. After chemo, she has developed palpitations for both rounds. She called cardiology who recommended that she comes in to the ED for eval. Her HR was up to 150s. Patient reports that she can always feel it when her HR gets above 140 because she becomes short of breath. Patient is wearing O2. She reports that she was diagnosed with COPD and has been on O2 since March. She is on her baseline requirement. Patient reports that she had a supratherapeutic INR > 4.0. she was told to hold her coumadin for a few days which she did. Now her INR is 1.8. Patient also reports that she has had some chest tightness associated with palpitations. She describes the two feelings together as "mice running in her chest." It is worse with exertion, but does not always go away with rest. Patient denies cough, fever, dysuria and any bleeding. Patient has been cardioverted in the past - last time was March 2024.  On ROS patient reports back pain from 3 known compression fractures.   Patient has no other complaints at this time. . She does not smoke, drinks light beer two times per week, and is vaxed for covid and flu. She is full code.   Review of Systems: As mentioned in the history of present illness. All other systems reviewed and are negative. Past Medical History:  Diagnosis Date   Aortic stenosis    ATRIAL ARRHYTHMIAS    Bicuspid aortic valve    CHF (congestive heart failure) (HCC)     COARCTATION OF AORTA    Dysrhythmia    ENDOMETRIOSIS    Heart murmur    Lymphoma (HCC)    Paroxysmal atrial fibrillation (HCC)    Port-A-Cath in place 08/31/2022   Shortness of breath    THORACIC AORTIC ANEURYSM    Vasculitis (HCC)    Past Surgical History:  Procedure Laterality Date   CARDIOVERSION N/A 08/29/2019   Procedure: CARDIOVERSION;  Surgeon: Antoine Poche, MD;  Location: [facility];  Service: Endoscopy;  Laterality: N/A;   CARDIOVERSION N/A 07/16/2022   Procedure: CARDIOVERSION;  Surgeon: Sande Rives, MD;  Location: [facility] ENDOSCOPY;  Service: Cardiovascular;  Laterality: N/A;   coarctation repair and residual restenosis     IR BONE MARROW BIOPSY & ASPIRATION  08/10/2022   IR IMAGING GUIDED PORT INSERTION  08/10/2022   KNEE SURGERY     LYMPH NODE BIOPSY Right 06/25/2022   Procedure: LYMPH NODE BIOPSY; right groin;  Surgeon: Lucretia Roers, MD;  Location: [facility];  Service: General;  Laterality: Right;   PORTA CATH INSERTION     TEE WITHOUT CARDIOVERSION N/A 07/12/2019   Procedure: TRANSESOPHAGEAL ECHOCARDIOGRAM (TEE);  Surgeon: Quintella Reichert, MD;  Location: [facility] ENDOSCOPY;  Service: Cardiovascular;  Laterality: N/A;   TEE WITHOUT CARDIOVERSION N/A 08/29/2019   Procedure: TRANSESOPHAGEAL ECHOCARDIOGRAM (TEE) WITH PROPOFOL;  Surgeon: Antoine Poche, MD;  Location: [facility];  Service: Endoscopy;  Laterality: N/A;   Social History:  reports that she has never smoked. She has been exposed to tobacco smoke. She has never used smokeless tobacco. She reports current alcohol use. She reports that she does not use drugs.  Allergies  Allergen Reactions   Amiodarone Nausea Only   Penicillins Hives    Has patient had a PCN reaction causing immediate rash, facial/tongue/throat swelling, SOB or lightheadedness with hypotension: YES Has patient had a PCN reaction causing severe rash involving mucus membranes or skin necrosis: NO Has patient had a PCN reaction that required  hospitalization NO Has patient had a PCN reaction occurring within the last 10 years: NO If all of the above answers are "NO", then may proceed with Cephalosporin use.     Family History  Problem Relation Age of Onset   Suicidality Mother    Mental illness Mother    Drug abuse Brother    Healthy Daughter    Healthy Son     Prior to Admission medications   Medication Sig Start Date End Date Taking? Authorizing Provider  acetaminophen (TYLENOL) 325 MG tablet Take 2 tablets (650 mg total) by mouth every 6 (six) hours as needed for mild pain (or Fever >/= 101). 06/27/22   Emokpae, Courage, MD  albuterol (VENTOLIN HFA) 108 (90 Base) MCG/ACT inhaler Inhale 2 puffs into the lungs every 4 (four) hours as needed for wheezing or shortness of breath. 06/27/22   Shon Hale, MD  allopurinol (ZYLOPRIM) 300 MG tablet Take 1 tablet (300 mg total) by mouth daily. 08/31/22   Doreatha Massed, MD  ALPRAZolam Prudy Feeler) 0.5 MG tablet Take 0.5 mg by mouth 3 (three) times daily. 07/01/22   [provider]  aspirin EC 81 MG tablet Take 1 tablet (81 mg total) by mouth daily with breakfast. 06/27/22   Mariea Clonts, Courage, MD  BENDAMUSTINE HCL IV Inject into the vein every 28 (twenty-eight) days. Days 1&2 every 28 days 09/08/22   [provider]  Budeson-Glycopyrrol-Formoterol (BREZTRI AEROSPHERE) 160-9-4.8 MCG/ACT AERO Inhale 2 puffs into the lungs 2 (two) times daily. 06/27/22   Shon Hale, MD  calcium-vitamin D (OSCAL WITH D) 500-5 MG-MCG tablet Take 1 tablet by mouth daily. 11/10/21   Vassie Loll, MD  dofetilide (TIKOSYN) 250 MCG capsule Take 1 capsule (250 mcg total) by mouth 2 (two) times daily. 08/12/22   Graciella Freer, PA-C  feeding supplement (ENSURE ENLIVE / ENSURE PLUS) LIQD Take 237 mLs by mouth 2 (two) times daily between meals. 11/10/21   Vassie Loll, MD  furosemide (LASIX) 40 MG tablet Take 2 tablets (80 mg total) by mouth daily. 06/27/22   Shon Hale, MD   gabapentin (NEURONTIN) 300 MG capsule Take 300-600 mg by mouth See admin instructions. Take 300 mg tablet by mouth in the morning, 300 mg tablet by mouth in the afternoon, and then take two of the 300 mg tablets by mouth for total of 600 mg in the evening per patient    [provider]  ipratropium-albuterol (DUONEB) 0.5-2.5 (3) MG/3ML SOLN Inhale 3 mLs into the lungs 2 (two) times daily as needed (shortness of breath). 06/27/22   Shon Hale, MD  lidocaine-prilocaine (EMLA) cream Apply a small amount to port a cath site and cover with plastic wrap 1 hour prior to infusion appointments 08/31/22   Doreatha Massed, MD  magnesium oxide (MAGOX 400) 400 (240 Mg) MG tablet Take 1 tablet (400 mg total) by mouth daily. 02/06/22   Croitoru, Mihai, MD  metoprolol succinate (TOPROL-XL) 25 MG 24 hr tablet Take 1  tablet (25 mg total) by mouth daily. 08/17/22   Terrilee Files, MD  metoprolol succinate (TOPROL-XL) 50 MG 24 hr tablet Take 1 tablet (50 mg total) by mouth daily. Take with or immediately following a meal. 06/29/22 06/24/23  Wendall Stade, MD  Multiple Vitamin (MULTI-VITAMINS) TABS Take 1 tablet by mouth daily.     [provider]  oxyCODONE-acetaminophen (PERCOCET) 10-325 MG tablet Take 1 tablet by mouth every 6 (six) hours as needed for pain. 09/30/22   Carnella Guadalajara, PA-C  pantoprazole (PROTONIX) 40 MG tablet Take 1 tablet (40 mg total) by mouth daily. 10/09/22   Doreatha Massed, MD  potassium chloride SA (KLOR-CON M) 20 MEQ tablet Take 2 tablets (40 mEq total) by mouth 3 (three) times daily. 09/24/22   Carnella Guadalajara, PA-C  predniSONE (DELTASONE) 20 MG tablet Take 1 tablet (20 mg total) by mouth daily with breakfast. 04/29/22   Wendall Stade, MD  riTUXimab (RITUXAN IV) Inject into the vein every 28 (twenty-eight) days. 09/08/22   [provider]  scopolamine (TRANSDERM-SCOP) 1 MG/3DAYS Place 1 patch (1.5 mg total) onto the skin every 3 (three) days.  10/06/22   Doreatha Massed, MD  venlafaxine (EFFEXOR) 75 MG tablet Take 75 mg by mouth 2 (two) times daily.    [provider]  warfarin (COUMADIN) 1 MG tablet Take warfarin 4 to 5 tablets daily or as directed by coumadin clinic 09/23/22   Wendall Stade, MD  zolpidem (AMBIEN) 10 MG tablet Take 1 tablet (10 mg total) by mouth at bedtime as needed for sleep. Patient not taking: Reported on 10/13/2022 06/27/22 07/27/22  Shon Hale, MD    Physical Exam: Vitals:   10/15/22 0300 10/15/22 0400 10/15/22 0459 10/15/22 0500  BP: 110/65 (!) 88/47  96/71  Pulse: (!) 137 (!) 110  (!) 140  Resp: 11 16  (!) 33  Temp:   98.2 F (36.8 C)   TempSrc:   Oral   SpO2: 100% 100%  100%  Weight:   50.5 kg   Height:       1.  General: Patient lying supine in bed,  appears to be in pain when she tries to re-position in the bed   2. Psychiatric: Alert and oriented x 3, mood and behavior normal for situation, pleasant and cooperative with exam   3. Neurologic: Speech and language are normal, face is symmetric, moves all 4 extremities voluntarily, at baseline without acute deficits on limited exam   4. HEENMT:  Head is atraumatic, normocephalic, pupils reactive to light, neck is supple, trachea is midline, mucous membranes are moist   5. Respiratory : Lungs are clear to auscultation bilaterally without wheezing, rhonchi, rales, no cyanosis, no increase in work of breathing or accessory muscle use   6. Cardiovascular : Heart rate tachycardic, rhythm is irregular, murmur present, rubs or gallops, no peripheral edema, peripheral pulses palpated   7. Gastrointestinal:  Abdomen is soft, nondistended, nontender to palpation bowel sounds active, no masses or organomegaly palpated   8. Skin:  Skin is warm, dry and intact without rashes, acute lesions, or ulcers on limited exam   9.Musculoskeletal:  No acute deformities or trauma, no asymmetry in tone, no peripheral edema, peripheral pulses  palpated, no tenderness to palpation in the extremities  Data Reviewed: A febrile, HR 130s - 150s, BP stable Maintaining O2 sats on 2 L [place] CXR = low lung volumes, no acute changes Dr. Antoine Poche advised admission, heparin, and likely cardioversion  in the AM Hypokalemic - replaced with in ED Admission requested for Afib RVR  Assessment and Plan: * Atrial fibrillation with RVR (HCC) - A fib with RVR -Subtherapeutic INR -Patient has been holding coumadin at the recommendation of her coumadin RN -Cards consulted and advised heparin with plan for possible cardioversion today -cards advised beta blockers >> but they are only effective for a brief period -Continue to monitor   Hypokalemia - Potassium is 3.0 - 40 mEq potassium given in the ED - Potassium is again 3.0 in the a.m. 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      Advance Care Planning:   Code Status: Full Code   Consults: Cardiology  Family Communication: No fam at bedside  Severity of Illness: The appropriate patient status for this patient is INPATIENT. Inpatient status is judged to be reasonable and necessary in order to provide the required intensity of service to ensure the patient's safety. The patient's presenting symptoms, physical exam findings, and initial radiographic and laboratory data in the context of their chronic comorbidities is felt to place them at  high risk for further clinical deterioration. Furthermore, it is not anticipated that the patient will be medically stable for discharge from the hospital within 2 midnights of admission.   * I certify that at the point of admission it is my clinical judgment that the patient will require inpatient hospital care spanning beyond 2 midnights from the point of admission due to high intensity of service, high risk for further deterioration and high frequency of surveillance required.*  Author: Lilyan Gilford, DO 10/15/2022 6:35 AM  For on call review [email or web address].

## 2022-10-15 NOTE — Assessment & Plan Note (Signed)
-   A fib with RVR -Subtherapeutic INR -Patient has been holding coumadin at the recommendation of her coumadin RN -Cards consulted and advised heparin with plan for possible cardioversion today -cards advised beta blockers >> but they are only effective for a brief period -Continue to monitor

## 2022-10-15 NOTE — CV Procedure (Signed)
CV Procedure Note  Procedure: Transesophagel echocardiogram and electrical cardioversion Physician: Dr [name] MD Indicatin: Atrial fibrillation   Patient was brought to the procedure suite after appropriate consent was obtained. Sedation was achieved with the assistance of anesthesiology, please refer to there documentation for details. Bite block placed and TEE probe intubated into the esophagus without difficultly and several views taken. No evidence of LA thrombus. Please see TEE report for full findings.  TEE probe removed and defib pads placed in the anterior and posterior positions. Succesfully cardioverted from afib to ectopic atrial rhythm with a single synchronized shock. Cardiopulmonary monitoring was performed throughout the procedure, she tolerated well without complications.    [name] MD

## 2022-10-15 NOTE — Progress Notes (Signed)
Electrical Cardioversion Procedure Note Mallory Sutton [id number] Feb 15, 1961  Procedure: Electrical Cardioversion Indications:  Atrial Fibrillation  Procedure Details Consent: Risks of procedure as well as the alternatives and risks of each were explained to the (patient/caregiver).  Consent for procedure obtained. Time Out: Verified patient identification, verified procedure, site/side was marked, verified correct patient position, special equipment/implants available, medications/allergies/relevent history reviewed, required imaging and test results available.  Performed  Patient placed on cardiac monitor, pulse oximetry, supplemental oxygen as necessary.  Sedation given: Short-acting barbiturates Pacer pads placed anterior and posterior chest.  Cardioverted 1 time(s).  Cardioverted at 200J.  Evaluation Findings: Post procedure EKG shows: NSR Complications: None Patient did tolerate procedure well.   Trenton Gammon S 10/15/2022, 3:12 PM

## 2022-10-15 NOTE — Anesthesia Postprocedure Evaluation (Signed)
Anesthesia Post Note  Patient: Lakesa Coste Tholl  Procedure(s) Performed: TRANSESOPHAGEAL ECHOCARDIOGRAM (TEE) CARDIOVERSION  Patient location during evaluation: Phase II Anesthesia Type: General Level of consciousness: awake and alert and oriented Pain management: pain level controlled Vital Signs Assessment: post-procedure vital signs reviewed and stable Respiratory status: spontaneous breathing, nonlabored ventilation and respiratory function stable Cardiovascular status: blood pressure returned to baseline and stable Postop Assessment: no apparent nausea or vomiting Anesthetic complications: no  No notable events documented.   Last Vitals:  Vitals:   10/15/22 1600 10/15/22 1619  BP: (!) 83/64   Pulse: 84   Resp: (!) 30   Temp: 36.7 C 36.5 C  SpO2: 100%     Last Pain:  Vitals:   10/15/22 1543  TempSrc:   PainSc: 0-No pain                 Cyril Railey C Ashby Leflore

## 2022-10-15 NOTE — Progress Notes (Signed)
ANTICOAGULATION CONSULT NOTE  Pharmacy Consult for Heparin Indication: atrial fibrillation Brief A/P: Heparin level subtherapeutic Increase Heparin rate  Allergies  Allergen Reactions   Amiodarone Nausea Only   Penicillins Hives    Has patient had a PCN reaction causing immediate rash, facial/tongue/throat swelling, SOB or lightheadedness with hypotension: YES Has patient had a PCN reaction causing severe rash involving mucus membranes or skin necrosis: NO Has patient had a PCN reaction that required hospitalization NO Has patient had a PCN reaction occurring within the last 10 years: NO If all of the above answers are "NO", then may proceed with Cephalosporin use.     Patient Measurements: Height: 5\' 4"  (162.6 cm) Weight: 49.6 kg (109 lb 5.6 oz) IBW/kg (Calculated) : 54.7 Heparin Dosing Weight: 50 kg  Vital Signs: Temp: 98.1 F (36.7 C) (06/13 0000) Temp Source: Oral (06/13 0000) BP: 96/73 (06/13 0200) Pulse Rate: 138 (06/13 0200)  Labs: Recent Labs    10/12/22 1515 10/14/22 1826 10/15/22 0340  HGB  --  9.5* 8.9*  HCT  --  30.7* 29.7*  PLT  --  365 320  LABPROT  --  21.0* 20.7*  INR 4.9* 1.8* 1.8*  HEPARINUNFRC  --   --  <0.10*  CREATININE  --  0.68 0.73     Estimated Creatinine Clearance: 58.6 mL/min (by C-G formula based on SCr of 0.73 mg/dL).  Assessment:   62 y.o. 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   Geannie Risen, PharmD, BCPS  10/15/2022, 4:54 AM

## 2022-10-15 NOTE — Assessment & Plan Note (Signed)
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

## 2022-10-16 DIAGNOSIS — I4891 Unspecified atrial fibrillation: Secondary | ICD-10-CM | POA: Diagnosis not present

## 2022-10-16 LAB — BASIC METABOLIC PANEL
Anion gap: 11 (ref 5–15)
BUN: 9 mg/dL (ref 6–20)
CO2: 27 mmol/L (ref 22–32)
Calcium: 8 mg/dL — ABNORMAL LOW (ref 8.9–10.3)
Chloride: 98 mmol/L (ref 98–111)
Creatinine, Ser: 0.62 mg/dL (ref 0.44–1.00)
GFR, Estimated: >60 mL/min (ref >60)
Glucose, Bld: 78 mg/dL (ref 70–99)
Potassium: 3.9 mmol/L (ref 3.5–5.1)
Sodium: 136 mmol/L (ref 135–145)

## 2022-10-16 LAB — CBC
HCT: 27.3 % — ABNORMAL LOW (ref 36.0–46.0)
Hemoglobin: 8 g/dL — ABNORMAL LOW (ref 12.0–15.0)
MCH: 25.2 pg — ABNORMAL LOW (ref 26.0–34.0)
MCHC: 29.3 g/dL — ABNORMAL LOW (ref 30.0–36.0)
MCV: 86.1 fL (ref 80.0–100.0)
Platelets: 278 10*3/uL (ref 150–400)
RBC: 3.17 MIL/uL — ABNORMAL LOW (ref 3.87–5.11)
RDW: 17.5 % — ABNORMAL HIGH (ref 11.5–15.5)
WBC: 18.4 10*3/uL — ABNORMAL HIGH (ref 4.0–10.5)
nRBC: 0 % (ref 0.0–0.2)

## 2022-10-16 LAB — PROTIME-INR
INR: 2 — ABNORMAL HIGH (ref 0.8–1.2)
Prothrombin Time: 23.1 seconds — ABNORMAL HIGH (ref 11.4–15.2)

## 2022-10-16 LAB — MAGNESIUM: Magnesium: 2.3 mg/dL (ref 1.7–2.4)

## 2022-10-16 MED ORDER — WARFARIN SODIUM 2 MG PO TABS: 4.0000 mg | ORAL_TABLET | Freq: Once | ORAL | Status: DC

## 2022-10-16 NOTE — Progress Notes (Signed)
ANTICOAGULATION CONSULT NOTE   Pharmacy Consult for Lovenox/Coumadin Indication: atrial fibrillation/AVR  Brief A/P: Heparin level subtherapeutic Increase Heparin rate  Allergies  Allergen Reactions   Amiodarone Nausea Only   Penicillins Hives    Has patient had a PCN reaction causing immediate rash, facial/tongue/throat swelling, SOB or lightheadedness with hypotension: YES Has patient had a PCN reaction causing severe rash involving mucus membranes or skin necrosis: NO Has patient had a PCN reaction that required hospitalization NO Has patient had a PCN reaction occurring within the last 10 years: NO If all of the above answers are "NO", then may proceed with Cephalosporin use.     Patient Measurements: Height: 5\' 4"  (162.6 cm) Weight: 52 kg (114 lb 10.2 oz) IBW/kg (Calculated) : 54.7 Heparin Dosing Weight: 50 kg  Vital Signs: Temp: 98.2 F (36.8 C) (06/14 0408) Temp Source: Oral (06/14 0408) BP: 110/53 (06/14 0604) Pulse Rate: 79 (06/14 0604)  Labs: Recent Labs    10/14/22 1826 10/15/22 0340 10/15/22 1403 10/16/22 0522  HGB 9.5* 8.9*  --  8.0*  HCT 30.7* 29.7*  --  27.3*  PLT 365 320  --  278  LABPROT 21.0* 20.7*  --  23.1*  INR 1.8* 1.8*  --  2.0*  HEPARINUNFRC  --  <0.10* 0.19*  --   CREATININE 0.68 0.73  --   --   TROPONINIHS  --  25*  --   --      Estimated Creatinine Clearance: 61.4 mL/min (by C-G formula based on SCr of 0.73 mg/dL).  Assessment:   62 y.o. 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 Coumadin resumed with lovenox bridging.  INR therapeutic today  Anticoag clinic adjusted dose to 3mg  on Tue, Thu, Sat, then 4mg  all other days on 10/12/22  Goal of Therapy:  INR 2-3 Monitor platelets by anticoagulation protocol: Yes   Plan:  Warfarin 4mg  po x1, may resume home regimen this weekend Continue lovenox - could dc after today's dose  Caryl Asp, PharmD Clinical Pharmacist 10/16/2022 8:04 AM

## 2022-10-16 NOTE — Discharge Summary (Signed)
Physician Discharge Summary  [name]:[id number] DOB: 1960/12/16 DOA: 10/14/2022  PCP: Leone Payor, FNP  Admit date: 10/14/2022  Discharge date: 10/16/2022  Admitted From:Home  Disposition:  Home  Recommendations for Outpatient Follow-up:  Follow up with PCP in 1-2 weeks Follow-up with cardiology outpatient as scheduled Continue Tikosyn and metoprolol as prior Continue on Coumadin with INR 2 Continue other home medications as prior  Home Health: None  Equipment/Devices: None  Discharge Condition:Stable  CODE STATUS: Full  Diet recommendation: Heart Healthy  Brief/Interim Summary:  [name] is a 62 y.o. female with medical history significant of CHF, Heart murmur, PAF, chronic respiratory failure and more presents to the ED with a chief complaint of palpitations. Patient reports that she has just had her second round of chemo. After chemo, she has developed palpitations for both rounds. She called cardiology who recommended that she comes in to the ED for eval. She had been admitted with atrial fibrillation with RVR and has undergone successful TEE/cardioversion on 6/13.  She remains in sinus rhythm this morning and potassium and magnesium are within normal limits.  She continues to have some ongoing shortness of breath, but this is near baseline for her.  Discharge Diagnoses:  Principal Problem:   Atrial fibrillation with RVR (HCC) Active Problems:   Hypokalemia   Leukocytosis   Chronic respiratory failure with hypoxia (HCC)  Principal discharge diagnosis: Atrial fibrillation with RVR status post TEE/DCCV now in sinus rhythm.  Discharge Instructions  Discharge Instructions     Diet - low sodium heart healthy   Complete by: As directed    Increase activity slowly   Complete by: As directed       Allergies as of 10/16/2022       Reactions   Amiodarone Nausea Only   Penicillins Hives   Has patient had a PCN reaction causing immediate rash,  facial/tongue/throat swelling, SOB or lightheadedness with hypotension: YES Has patient had a PCN reaction causing severe rash involving mucus membranes or skin necrosis: NO Has patient had a PCN reaction that required hospitalization NO Has patient had a PCN reaction occurring within the last 10 years: NO If all of the above answers are "NO", then may proceed with Cephalosporin use.        Medication List     TAKE these medications    acetaminophen 325 MG tablet Commonly known as: TYLENOL Take 2 tablets (650 mg total) by mouth every 6 (six) hours as needed for mild pain (or Fever >/= 101).   allopurinol 300 MG tablet Commonly known as: ZYLOPRIM Take 1 tablet (300 mg total) by mouth daily.   ALPRAZolam 0.5 MG tablet Commonly known as: XANAX Take 0.5 mg by mouth 3 (three) times daily.   aspirin EC 81 MG tablet Take 1 tablet (81 mg total) by mouth daily with breakfast.   BENDAMUSTINE HCL IV Inject into the vein every 28 (twenty-eight) days. Days 1&2 every 28 days   Breztri Aerosphere 160-9-4.8 MCG/ACT Aero Generic drug: Budeson-Glycopyrrol-Formoterol Inhale 2 puffs into the lungs 2 (two) times daily.   calcium-vitamin D 500-5 MG-MCG tablet Commonly known as: OSCAL WITH D Take 1 tablet by mouth daily.   dofetilide 250 MCG capsule Commonly known as: TIKOSYN Take 1 capsule (250 mcg total) by mouth 2 (two) times daily.   feeding supplement Liqd Take 237 mLs by mouth 2 (two) times daily between meals.   furosemide 40 MG tablet Commonly known as: LASIX Take 2 tablets (80 mg total)  by mouth daily.   gabapentin 300 MG capsule Commonly known as: NEURONTIN Take 300-600 mg by mouth See admin instructions. Take 300 mg tablet by mouth in the morning, 300 mg tablet by mouth in the afternoon, and then take two of the 300 mg tablets by mouth for total of 600 mg in the evening per patient   ipratropium-albuterol 0.5-2.5 (3) MG/3ML Soln Commonly known as: DUONEB Inhale 3 mLs into  the lungs 2 (two) times daily as needed (shortness of breath).   lidocaine-prilocaine cream Commonly known as: EMLA Apply a small amount to port a cath site and cover with plastic wrap 1 hour prior to infusion appointments   magnesium oxide 400 (240 Mg) MG tablet Commonly known as: MagOx 400 Take 1 tablet (400 mg total) by mouth daily.   metoprolol succinate 50 MG 24 hr tablet Commonly known as: TOPROL-XL Take 1 tablet (50 mg total) by mouth daily. Take with or immediately following a meal.   metoprolol succinate 25 MG 24 hr tablet Commonly known as: TOPROL-XL Take 1 tablet (25 mg total) by mouth daily.   Multi-Vitamins Tabs Take 1 tablet by mouth daily.   oxyCODONE-acetaminophen 10-325 MG tablet Commonly known as: PERCOCET TAKE 1 TABLET BY MOUTH EVERY 6 HOURS AS NEEDED FOR PAIN What changed:  reasons to take this additional instructions   pantoprazole 40 MG tablet Commonly known as: PROTONIX Take 1 tablet (40 mg total) by mouth daily.   potassium chloride SA 20 MEQ tablet Commonly known as: KLOR-CON M Take 2 tablets (40 mEq total) by mouth 3 (three) times daily.   predniSONE 20 MG tablet Commonly known as: DELTASONE Take 1 tablet (20 mg total) by mouth daily with breakfast.   RITUXAN IV Inject into the vein every 28 (twenty-eight) days.   Transderm-Scop 1 MG/3DAYS Generic drug: scopolamine Place 1 patch (1.5 mg total) onto the skin every 3 (three) days.   venlafaxine 75 MG tablet Commonly known as: EFFEXOR Take 75 mg by mouth 2 (two) times daily.   Ventolin HFA 108 (90 Base) MCG/ACT inhaler Generic drug: albuterol Inhale 2 puffs into the lungs every 4 (four) hours as needed for wheezing or shortness of breath.   warfarin 1 MG tablet Commonly known as: COUMADIN Take as directed. If you are unsure how to take this medication, talk to your nurse or doctor. Original instructions: Take warfarin 4 to 5 tablets daily or as directed by coumadin clinic What changed:   how much to take how to take this when to take this   zolpidem 10 MG tablet Commonly known as: AMBIEN Take 1 tablet (10 mg total) by mouth at bedtime as needed for sleep.        Follow-up Information     Leone Payor, FNP. Schedule an appointment as soon as possible for a visit in 1 week(s).   Specialty: Family Medicine Contact information: [address] [place] [address] [phone]                Allergies  Allergen Reactions   Amiodarone Nausea Only   Penicillins Hives    Has patient had a PCN reaction causing immediate rash, facial/tongue/throat swelling, SOB or lightheadedness with hypotension: YES Has patient had a PCN reaction causing severe rash involving mucus membranes or skin necrosis: NO Has patient had a PCN reaction that required hospitalization NO Has patient had a PCN reaction occurring within the last 10 years: NO If all of the above answers are "NO", then  may proceed with Cephalosporin use.     Consultations: Cardiology   Procedures/Studies: ECHO TEE  Result Date: 10/15/2022    TRANSESOPHOGEAL ECHO REPORT   Patient Name:   Mallory Sutton Date of Exam: 10/15/2022 Medical Rec #:  [id number]    Height:       64.0 in Accession #:    [id number]   Weight:       111.3 lb Date of Birth:  12-13-1960   BSA:          1.525 m Patient Age:    61 years     BP:           109/76 mmHg Patient Gender: F            HR:           115 bpm. [facility] Procedure: Transesophageal Echo, Cardiac Doppler and Color Doppler Indications:    Atrial fibrillation I48.91  History:        Patient has prior history of Echocardiogram examinations, most                 recent 05/21/2022. CHF, COPD, Arrythmias:Atrial Fibrillation;                 Risk Factors:Non-Smoker, Hypertension and Dyslipidemia. There is                 a 19mm On-X prosthetic valve in the Aortic position. Procedure                 date 07/25/14. Aortic root repair, Coarctation of the Aorto.   Sonographer:    Celesta Gentile RCS Referring Phys: [id number] Dorothe Pea BRANCH PROCEDURE: The transesophogeal probe was passed without difficulty through the esophogus of the patient. Sedation performed by different physician. Image quality was good. The patient developed no complications during the procedure.  IMPRESSIONS  1. Left ventricular ejection fraction, by estimation, is 55 to 60%. The left ventricle has normal function.  2. Right ventricular systolic function is normal. The right ventricular size is normal.  3. Left atrial size was moderately dilated. No left atrial/left atrial appendage thrombus was detected. The LAA emptying velocity was 15 cm/s.  4. Right atrial size was moderately dilated.  5. The mitral valve is abnormal. Mild mitral valve regurgitation. No evidence of mitral stenosis.  6. The tricuspid valve is abnormal.  7. 19mm on X mechanical valve is in the AV position. . The aortic valve has been repaired/replaced. Aortic valve regurgitation is not visualized. No aortic stenosis is present. FINDINGS  Left Ventricle: Left ventricular ejection fraction, by estimation, is 55 to 60%. The left ventricle has normal function. The left ventricular internal cavity size was normal in size. Right Ventricle: The right ventricular size is normal. Right vetricular wall thickness was not well visualized. Right ventricular systolic function is normal. Left Atrium: Left atrial size was moderately dilated. No left atrial/left atrial appendage thrombus was detected. The LAA emptying velocity was 15 cm/s. Right Atrium: Right atrial size was moderately dilated. Pericardium: There is no evidence of pericardial effusion. Mitral Valve: The mitral valve is abnormal. Mild mitral valve regurgitation. No evidence of mitral valve stenosis. Tricuspid Valve: The tricuspid valve is abnormal. Tricuspid valve regurgitation is mild . No evidence of tricuspid stenosis. Aortic Valve: 19mm on X mechanical valve is in the AV position.  The aortic valve has been repaired/replaced. Aortic valve regurgitation is not visualized. No aortic stenosis is present. Pulmonic Valve: The pulmonic  valve was not well visualized. Pulmonic valve regurgitation is not visualized. No evidence of pulmonic stenosis. Aorta: The aortic root is normal in size and structure. IAS/Shunts: No atrial level shunt detected by color flow Doppler.   AORTA Ao Root diam: 2.90 cm [name] MD Electronically signed by [name] MD Signature Date/Time: 10/15/2022/4:05:15 PM    Final    DG Chest Port 1 View  Result Date: 10/14/2022 CLINICAL DATA:  Atrial fibrillation. EXAM: PORTABLE CHEST 1 VIEW COMPARISON:  Chest/rib radiographs 09/11/2022. Chest CTA 09/12/2022. FINDINGS: The patient is rotated to the left with grossly unchanged cardiomediastinal silhouette. Sequelae of aortic valve replacement and left atrial appendage clipping are again identified. A right jugular Port-A-Cath remains in place with tip projecting over the high right atrium. Lung volumes are decreased with mild interstitial prominence and mild atelectasis in the left lung base. No overt pulmonary edema, segmental airspace consolidation, sizeable pleural effusion, or pneumothorax is identified. No acute osseous abnormality is seen. IMPRESSION: Low lung volumes without evidence of acute airspace disease. Electronically Signed   By: Sebastian Ache M.D.   On: 10/14/2022 18:37     Discharge Exam: Vitals:   10/16/22 0808 10/16/22 0855  BP:    Pulse:    Resp:    Temp: 97.8 F (36.6 C)   SpO2:  100%   Vitals:   10/16/22 0408 10/16/22 0604 10/16/22 0808 10/16/22 0855  BP:  (!) 110/53    Pulse:  79    Resp:  (!) 23    Temp: 98.2 F (36.8 C)  97.8 F (36.6 C)   TempSrc: Oral  Oral   SpO2:  96%  100%  Weight: 52 kg     Height:        General: Pt is alert, awake, not in acute distress Cardiovascular: RRR, S1/S2 +, no rubs, no gallops Respiratory: CTA bilaterally, no wheezing, no  rhonchi Abdominal: Soft, NT, ND, bowel sounds + Extremities: no edema, no cyanosis    The results of significant diagnostics from this hospitalization (including imaging, microbiology, ancillary and laboratory) are listed below for reference.     Microbiology: Recent Results (from the past 240 hour(s))  MRSA Next Gen by PCR, Nasal     Status: None   Collection Time: 10/15/22 12:11 AM   Specimen: Nasal Mucosa; Nasal Swab  Result Value Ref Range Status   MRSA by PCR Next Gen NOT DETECTED NOT DETECTED Final    Comment: (NOTE) The GeneXpert MRSA Assay (FDA approved for NASAL specimens only), is one component of a comprehensive MRSA colonization surveillance program. It is not intended to diagnose MRSA infection nor to guide or monitor treatment for MRSA infections. Test performance is not FDA approved in patients less than 37 years old. Performed at [facility], [address] [place], [place] [address]      Labs: BNP (last 3 results) No results for input(s): "BNP" in the last 8760 hours. Basic Metabolic Panel: Recent Labs  Lab 10/14/22 1826 10/15/22 0340 10/16/22 0854  NA 135 137 136  K 3.0* 3.0* 3.9  CL 93* 93* 98  CO2 30 33* 27  GLUCOSE 110* 98 78  BUN 11 11 9   CREATININE 0.68 0.73 0.62  CALCIUM 9.0 8.6* 8.0*  MG  --  2.2 2.3   Liver Function Tests: Recent Labs  Lab 10/14/22 1826 10/15/22 0340  AST 20 19  ALT 16 14  ALKPHOS 186* 167*  BILITOT 0.6 0.7  PROT 6.3* 5.8*  ALBUMIN 3.8 3.5  No results for input(s): "LIPASE", "AMYLASE" in the last 168 hours. No results for input(s): "AMMONIA" in the last 168 hours. CBC: Recent Labs  Lab 10/14/22 1826 10/15/22 0340 10/16/22 0522  WBC 37.4* 38.0* 18.4*  NEUTROABS 31.0* 33.4*  --   HGB 9.5* 8.9* 8.0*  HCT 30.7* 29.7* 27.3*  MCV 83.0 85.1 86.1  PLT 365 320 278   Cardiac Enzymes: No results for input(s): "CKTOTAL", "CKMB", "CKMBINDEX", "TROPONINI" in the last 168 hours. BNP: Invalid input(s):  "POCBNP" CBG: No results for input(s): "GLUCAP" in the last 168 hours. D-Dimer No results for input(s): "DDIMER" in the last 72 hours. Hgb A1c No results for input(s): "HGBA1C" in the last 72 hours. Lipid Profile No results for input(s): "CHOL", "HDL", "LDLCALC", "TRIG", "CHOLHDL", "LDLDIRECT" in the last 72 hours. Thyroid function studies No results for input(s): "TSH", "T4TOTAL", "T3FREE", "THYROIDAB" in the last 72 hours.  Invalid input(s): "FREET3" Anemia work up No results for input(s): "VITAMINB12", "FOLATE", "FERRITIN", "TIBC", "IRON", "RETICCTPCT" in the last 72 hours. Urinalysis    Component Value Date/Time   COLORURINE YELLOW 10/15/2022 0009   APPEARANCEUR HAZY (A) 10/15/2022 0009   LABSPEC 1.023 10/15/2022 0009   PHURINE 5.0 10/15/2022 0009   GLUCOSEU NEGATIVE 10/15/2022 0009   HGBUR NEGATIVE 10/15/2022 0009   BILIRUBINUR NEGATIVE 10/15/2022 0009   KETONESUR NEGATIVE 10/15/2022 0009   PROTEINUR 100 (A) 10/15/2022 0009   NITRITE NEGATIVE 10/15/2022 0009   LEUKOCYTESUR NEGATIVE 10/15/2022 0009   Sepsis Labs Recent Labs  Lab 10/14/22 1826 10/15/22 0340 10/16/22 0522  WBC 37.4* 38.0* 18.4*   Microbiology Recent Results (from the past 240 hour(s))  MRSA Next Gen by PCR, Nasal     Status: None   Collection Time: 10/15/22 12:11 AM   Specimen: Nasal Mucosa; Nasal Swab  Result Value Ref Range Status   MRSA by PCR Next Gen NOT DETECTED NOT DETECTED Final    Comment: (NOTE) The GeneXpert MRSA Assay (FDA approved for NASAL specimens only), is one component of a comprehensive MRSA colonization surveillance program. It is not intended to diagnose MRSA infection nor to guide or monitor treatment for MRSA infections. Test performance is not FDA approved in patients less than 26 years old. Performed at [facility], [address], [place], [place] [address]      Time coordinating discharge: 35 minutes  SIGNED:   Erick Blinks, DO [facility] 10/16/2022,  9:51 AM  If 7PM-7AM, please contact night-coverage [email or web address]

## 2022-10-16 NOTE — Progress Notes (Signed)
Rounding Note    Patient Name: Mallory Sutton Date of Encounter: 10/16/2022  [facility] Cardiologist: Charlton Haws, MD   Subjective   No palpitations this AM  Inpatient Medications    Scheduled Meds:  allopurinol  300 mg Oral QHS   ALPRAZolam  0.5 mg Oral TID   aspirin EC  81 mg Oral Q breakfast   Chlorhexidine Gluconate Cloth  6 each Topical Q0600   dofetilide  250 mcg Oral BID   enoxaparin (LOVENOX) injection  50 mg Subcutaneous Q12H   gabapentin  300 mg Oral BID AC   gabapentin  600 mg Oral QHS   hydrocortisone cream   Topical BID   metoprolol succinate  50 mg Oral Daily   mometasone-formoterol  2 puff Inhalation BID   mupirocin ointment  1 Application Nasal BID   pantoprazole  40 mg Oral Daily   umeclidinium bromide  1 puff Inhalation Daily   venlafaxine  75 mg Oral BID   warfarin  4 mg Oral ONCE-1600   Warfarin - Pharmacist Dosing Inpatient   Does not apply q1600   Continuous Infusions:  PRN Meds: acetaminophen **OR** acetaminophen, ipratropium-albuterol, lidocaine, ondansetron **OR** ondansetron (ZOFRAN) IV, oxyCODONE-acetaminophen   Vital Signs    Vitals:   10/16/22 0400 10/16/22 0408 10/16/22 0604 10/16/22 0808  BP: (!) 118/46  (!) 110/53   Pulse: 82  79   Resp: 14  (!) 23   Temp:  98.2 F (36.8 C)  97.8 F (36.6 C)  TempSrc:  Oral  Oral  SpO2: 100%  96%   Weight:  52 kg    Height:        Intake/Output Summary (Last 24 hours) at 10/16/2022 0836 Last data filed at 10/16/2022 0529 Gross per 24 hour  Intake 632.05 ml  Output 300 ml  Net 332.05 ml      10/16/2022    4:08 AM 10/15/2022    2:17 PM 10/15/2022    4:59 AM  Last 3 Weights  Weight (lbs) 114 lb 10.2 oz 111 lb 5.3 oz 111 lb 5.3 oz  Weight (kg) 52 kg 50.5 kg 50.5 kg      Telemetry    NSR - Personally Reviewed  ECG    N/a - Personally Reviewed  Physical Exam   GEN: No acute distress.   Neck: No JVD Cardiac: RRR Respiratory: Clear to auscultation bilaterally. GI:  Soft, nontender, non-distended  MS: No edema; No deformity. Neuro:  Nonfocal  Psych: Normal affect   Labs    High Sensitivity Troponin:   Recent Labs  Lab 10/15/22 0340  TROPONINIHS 25*     Chemistry Recent Labs  Lab 10/14/22 1826 10/15/22 0340  NA 135 137  K 3.0* 3.0*  CL 93* 93*  CO2 30 33*  GLUCOSE 110* 98  BUN 11 11  CREATININE 0.68 0.73  CALCIUM 9.0 8.6*  MG  --  2.2  PROT 6.3* 5.8*  ALBUMIN 3.8 3.5  AST 20 19  ALT 16 14  ALKPHOS 186* 167*  BILITOT 0.6 0.7  GFRNONAA >60 >60  ANIONGAP 12 11    Lipids No results for input(s): "CHOL", "TRIG", "HDL", "LABVLDL", "LDLCALC", "CHOLHDL" in the last 168 hours.  Hematology Recent Labs  Lab 10/14/22 1826 10/15/22 0340 10/16/22 0522  WBC 37.4* 38.0* 18.4*  RBC 3.70* 3.49* 3.17*  HGB 9.5* 8.9* 8.0*  HCT 30.7* 29.7* 27.3*  MCV 83.0 85.1 86.1  MCH 25.7* 25.5* 25.2*  MCHC 30.9 30.0 29.3*  RDW  17.5* 17.7* 17.5*  PLT 365 320 278   Thyroid No results for input(s): "TSH", "FREET4" in the last 168 hours.  BNPNo results for input(s): "BNP", "PROBNP" in the last 168 hours.  DDimer No results for input(s): "DDIMER" in the last 168 hours.   Radiology    ECHO TEE  Result Date: 10/15/2022    TRANSESOPHOGEAL ECHO REPORT   Patient Name:   Mallory Sutton Date of Exam: 10/15/2022 Medical Rec #:  [id number]    Height:       64.0 in Accession #:    [id number]   Weight:       111.3 lb Date of Birth:  1960/05/10   BSA:          1.525 m Patient Age:    61 years     BP:           109/76 mmHg Patient Gender: F            HR:           115 bpm. [facility] Procedure: Transesophageal Echo, Cardiac Doppler and Color Doppler Indications:    Atrial fibrillation I48.91  History:        Patient has prior history of Echocardiogram examinations, most                 recent 05/21/2022. CHF, COPD, Arrythmias:Atrial Fibrillation;                 Risk Factors:Non-Smoker, Hypertension and Dyslipidemia. There is                 a 19mm On-X  prosthetic valve in the Aortic position. Procedure                 date 07/25/14. Aortic root repair, Coarctation of the Aorto.  Sonographer:    Celesta Gentile RCS Referring Phys: [id number] Dorothe Pea Latrell [place] PROCEDURE: The transesophogeal probe was passed without difficulty through the esophogus of the patient. Sedation performed by different physician. Image quality was good. The patient developed no complications during the procedure.  IMPRESSIONS  1. Left ventricular ejection fraction, by estimation, is 55 to 60%. The left ventricle has normal function.  2. Right ventricular systolic function is normal. The right ventricular size is normal.  3. Left atrial size was moderately dilated. No left atrial/left atrial appendage thrombus was detected. The LAA emptying velocity was 15 cm/s.  4. Right atrial size was moderately dilated.  5. The mitral valve is abnormal. Mild mitral valve regurgitation. No evidence of mitral stenosis.  6. The tricuspid valve is abnormal.  7. 19mm on X mechanical valve is in the AV position. . The aortic valve has been repaired/replaced. Aortic valve regurgitation is not visualized. No aortic stenosis is present. FINDINGS  Left Ventricle: Left ventricular ejection fraction, by estimation, is 55 to 60%. The left ventricle has normal function. The left ventricular internal cavity size was normal in size. Right Ventricle: The right ventricular size is normal. Right vetricular wall thickness was not well visualized. Right ventricular systolic function is normal. Left Atrium: Left atrial size was moderately dilated. No left atrial/left atrial appendage thrombus was detected. The LAA emptying velocity was 15 cm/s. Right Atrium: Right atrial size was moderately dilated. Pericardium: There is no evidence of pericardial effusion. Mitral Valve: The mitral valve is abnormal. Mild mitral valve regurgitation. No evidence of mitral valve stenosis. Tricuspid Valve: The tricuspid valve is abnormal. Tricuspid  valve regurgitation is mild .  No evidence of tricuspid stenosis. Aortic Valve: 19mm on X mechanical valve is in the AV position. The aortic valve has been repaired/replaced. Aortic valve regurgitation is not visualized. No aortic stenosis is present. Pulmonic Valve: The pulmonic valve was not well visualized. Pulmonic valve regurgitation is not visualized. No evidence of pulmonic stenosis. Aorta: The aortic root is normal in size and structure. IAS/Shunts: No atrial level shunt detected by color flow Doppler.   AORTA Ao Root diam: 2.90 cm [name] MD Electronically signed by [name] MD Signature Date/Time: 10/15/2022/4:05:15 PM    Final    DG Chest Port 1 View  Result Date: 10/14/2022 CLINICAL DATA:  Atrial fibrillation. EXAM: PORTABLE CHEST 1 VIEW COMPARISON:  Chest/rib radiographs 09/11/2022. Chest CTA 09/12/2022. FINDINGS: The patient is rotated to the left with grossly unchanged cardiomediastinal silhouette. Sequelae of aortic valve replacement and left atrial appendage clipping are again identified. A right jugular Port-A-Cath remains in place with tip projecting over the high right atrium. Lung volumes are decreased with mild interstitial prominence and mild atelectasis in the left lung base. No overt pulmonary edema, segmental airspace consolidation, sizeable pleural effusion, or pneumothorax is identified. No acute osseous abnormality is seen. IMPRESSION: Low lung volumes without evidence of acute airspace disease. Electronically Signed   By: Sebastian Ache M.D.   On: 10/14/2022 18:37    Cardiac Studies    Patient Profile     Mallory Sutton is a 62 y.o. 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   Assessment & Plan    PAF -failed propafenone in the past - has been on dofetilide, noted break throughs over time in clinic follow ups but typically short in  duration. Longer episodes of palpitaitons typically a few days after her chemo treatments, does have some N/V after chemo but able to keep meds down. Most recent palpitations have lasted several days. K was 3.0 on admission.  -she was in SR 08/21/22 appt   -home regimen is dofetilide bid, toprol 75mg  - 6/14 TEE/DCCV to SR   - start dilt gtt, continue home regimen. Plan for TEE/DCCV today, requires TEE given INRs were subtherapeutic. - reports nausea on amio in the past. - long term management of afib to continue with afib cliinc and Dr Ladona Ridgel, will need close outpatient f/u  -INR is at 2, can d/c lovenox. COntinue coumadin - f/u BMET/Mg, since on dofeitlide would verify K is back to 4 and Mg still at 2 prior to discharge. If electrolytes ok then ok for discharge, we will arrange f/u   For questions or updates, please contact [facility] Please consult [email or web address] for contact info under        Signed, [name], MD  10/16/2022, 8:36 AM

## 2022-10-19 NOTE — Progress Notes (Signed)
Patient ID: Yevette EdwardsSusan H Mifflin, female   DOB: Sep 26, 1960, 62 y.o.   MRN: [id number]      62 y.o. long term patient followed since 1999. She has had congenital heart disease with coarctation repair at age 89 via left thoracotomy. Known persistent left sided SVC  Followed for long time for bicuspid AV and aortic aneurysm Finally had AVR and arch repair by Dr Kizzie BaneHughes in 2016. I was upset that he did not bypass her coarctation at the time She had PAF and MAZE with LAA occlusion. She has been stable on propafenone for years and followed by Dr Ladona Ridgelaylor in EP  She developed a neuropathy and painful ? Vascular rash in her legs 2019 with short course of steroids.   Recent US RLE with complex cystic lesion in posterior upper left thigh   TEE done 08/29/19 showed normal EF she had no AR and mean gradient across her AVR 14 mmHg. She has not had recent MRI/CT imaging of her coarctation but the MLD had been < 1 cm in past     Her care has been compromised in past due to lack of insurance and financial issues She has been sporadic at times with her INR checks being subRx the last month   Vasculitis does not seem well Rx Still with lots of discoloration and some pain in legs Also developing more cysts She needs to have on removed by Dr Henreitta LeberBridges from back of right thigh as it is pressing on nerve Also has one above right knee with lateral knee effusion Had steroid injection right knee 11/01/20 Seen by rheumatology DR Dimple Caseyice 12/31/20  Labs including ESR, ANCA Cryoglobulin and cardiolipin negative   TTE:  07/20/21  EF 60-65% Normal RV mild MR AVR On-X 19 mm mean gradient 29 mmHg trivial PVL DVI 0.44  CXR 07/16/21 Emphysema NAD   BNP 07/20/21 437 BUN/Cr BUN 25 Cr 1.19 K 3.8  Hct 32.5  INR 2.5   Hospitalized 3/15-21/23 for hypoxemic respiratory failure COPD She is a non smoker Wood stove for heat D/c on 2L's oxygen Symbicort and Spiriva Plan to do walk test and consider nocturnal oxymetry She was in NSR throughout and d/c with lasix  80 mg daily   08/15/21 emotionally a wreck She feels helpless with another medical issue and being tied to oxygen. Her husband Nadine CountsBob is being ugly to her in regard to living his life in the shadow of all her medical problems and he is getting too old to do all his landscaping work and finances have always been tight. I spoke to her daughter Irving Burtonmily at that time and son lives with them   Seen by Dr Francine Gravenewald 10/06/21 PFTls with severe obstruction and moderate diffusion defect Still on 2 L's oxygen   Continues to struggle with vasculitis/pain in arms/legs and dyspnea with her lung dx DLCO is < 50% and FEV1 < 1 Now using 3L' [place]  Recent ANCA negative as was Histone Ab and ANA   She is emotional again today PAF at night better and likely related to anxiety and hypoxia. Has lost 20 lbs since April Frustrated with lack of diagnosis with lungs   Despite increasing her rhythmol she has had more PAF [facility] [place] [facility]   Had a lymph node biopsy in groin after PET /CT suggested lymphoma. Surgical path is pending. 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 Port a cath placed 08/10/22 Started on chemo which makes her feel miserable and precipitates more PAF.  Required TEE/DCC 10/15/22 AAT Rx limited with prior failed propafenone, nausea with amiodarone and break through on low dose tikosyn Is to see Dr Ladona Ridgel / Elberta Fortis for possible ablation but functional status poor.    09/12/22 see in EF for back pain and has L5/T10 compression fracture Using percocet and plans for kyphoplasty  She is in NSR today No doubt she may have more PAF with chemo, surgery or any stress but she is stable with it and we have exhausted AAT She  is not a candidate for ablation per Dr Elberta Fortis and Ladona Ridgel   ROS: Denies fever, malais, weight loss, blurry vision, decreased visual acuity, cough, sputum, SOB, hemoptysis, pleuritic pain, palpitaitons, heartburn, abdominal pain, melena, lower extremity edema, claudication, or rash.  All other systems reviewed and negative  General: Vitals:   10/30/22 1129  BP: 124/84  Pulse: 72  SpO2: 99%       BP 124/84   Pulse 72   Ht 5\' 4"  (1.626 m)   Wt 108 lb 12.8 oz (49.4 kg)   LMP 07/16/2013   SpO2 99% Comment: 2 l Oxygen  BMI 18.68 kg/m    Affect appropriate Thin chronically ill  HEENT: normal Neck supple with no adenopathy Port a cath new  Lungs clear with no wheezing and good diaphragmatic motion Heart:  S1/S2 click SEM through AVR  No AR  murmur, no rub, gallop or click PMI normal Post left thoracotomy and sternotomy. Coarctation murmur radiates to back  Abdomen: benighn, BS positve, no tenderness, no AAA no bruit.  No HSM or HJR post biopsy right groin healing  Distal pulses intact with no bruits Post surgery age 83 left knee and bilateral knee swelling  Neuro non-focal Skin vasculitic rash arms and legs  Cystic mass back of right thigh With right knee effusion and cyst above knee     Current Outpatient Medications  Medication Sig Dispense Refill   acetaminophen (TYLENOL) 325 MG tablet Take 2 tablets (650 mg total) by mouth every 6 (six) hours as needed for mild pain (or Fever >/= 101). 100 tablet 1   albuterol (VENTOLIN HFA) 108 (90 Base) MCG/ACT inhaler Inhale 2 puffs into the lungs every 4 (four) hours as needed for wheezing or shortness of breath. 54 g 6   allopurinol (ZYLOPRIM) 300 MG tablet Take 1 tablet (300 mg total) by mouth daily. 30 tablet 3   ALPRAZolam (XANAX) 0.5 MG tablet Take 0.5 mg by mouth 3 (three) times daily.     aspirin EC 81 MG tablet Take 1 tablet (81 mg total) by mouth daily with breakfast. 30 tablet 11   BENDAMUSTINE HCL IV Inject into the vein  every 28 (twenty-eight) days. Days 1&2 every 28 days     Budeson-Glycopyrrol-Formoterol (BREZTRI AEROSPHERE) 160-9-4.8 MCG/ACT AERO Inhale 2 puffs into the lungs 2 (two) times daily. 10.7 g 1   calcium-vitamin D (OSCAL WITH D) 500-5 MG-MCG tablet Take 1 tablet by mouth daily. 30 tablet 2   dofetilide (TIKOSYN) 250 MCG capsule Take 1 capsule (250 mcg total) by mouth 2 (two) times daily. 60 capsule 6   feeding supplement (ENSURE ENLIVE /  ENSURE PLUS) LIQD Take 237 mLs by mouth 2 (two) times daily between meals.     furosemide (LASIX) 40 MG tablet Take 2 tablets (80 mg total) by mouth daily. 30 tablet 3   gabapentin (NEURONTIN) 300 MG capsule Take 300-600 mg by mouth See admin instructions. Take 300 mg tablet by mouth in the morning, 300 mg tablet by mouth in the afternoon, and then take two of the 300 mg tablets by mouth for total of 600 mg in the evening per patient     ipratropium-albuterol (DUONEB) 0.5-2.5 (3) MG/3ML SOLN Inhale 3 mLs into the lungs 2 (two) times daily as needed (shortness of breath). 360 mL 1   lidocaine-prilocaine (EMLA) cream Apply a small amount to port a cath site and cover with plastic wrap 1 hour prior to infusion appointments 30 g 3   magnesium oxide (MAGOX 400) 400 (240 Mg) MG tablet Take 1 tablet (400 mg total) by mouth daily. 30 tablet 2   metoprolol succinate (TOPROL-XL) 100 MG 24 hr tablet Take 1.5 tablets (150 mg total) by mouth daily. Take with or immediately following a meal. 135 tablet 3   Multiple Vitamin (MULTI-VITAMINS) TABS Take 1 tablet by mouth daily.      oxyCODONE-acetaminophen (PERCOCET) 10-325 MG tablet TAKE 1 TABLET BY MOUTH EVERY 6 HOURS AS NEEDED FOR PAIN 120 tablet 0   pantoprazole (PROTONIX) 40 MG tablet Take 1 tablet (40 mg total) by mouth daily. 30 tablet 2   potassium chloride SA (KLOR-CON M) 20 MEQ tablet Take 2 tablets (40 mEq total) by mouth 3 (three) times daily. 180 tablet 3   predniSONE (DELTASONE) 20 MG tablet Take 1 tablet (20 mg total) by  mouth daily with breakfast. 90 tablet 3   riTUXimab (RITUXAN IV) Inject into the vein every 28 (twenty-eight) days.     scopolamine (TRANSDERM-SCOP) 1 MG/3DAYS Place 1 patch (1.5 mg total) onto the skin every 3 (three) days. 10 patch 0   venlafaxine (EFFEXOR) 75 MG tablet Take 75 mg by mouth 2 (two) times daily.     warfarin (COUMADIN) 1 MG tablet Take warfarin 4 to 5 tablets daily or as directed by coumadin clinic (Patient taking differently: Take 1 mg by mouth as directed. Take warfarin 4 to 5 tablets daily or as directed by coumadin clinic) 135 tablet 5   zolpidem (AMBIEN) 10 MG tablet Take 1 tablet (10 mg total) by mouth at bedtime as needed for sleep. 10 tablet 0   No current facility-administered medications for this visit.    Allergies  Amiodarone and Penicillins  Electrocardiogram:  Afib rate 150 QT normal   Assessment and Plan  PAF:  2016 post MAZE/LAA exclusion increased episodes of PAF/Flutter Rhythmol dose increased [facility] 06/27/22 Recurrent and now post Tikosyn load with [facility] 07/16/22 and again June 2024 Nausea with amiodarone  Not an ablation candidate In NSR today on low dose Tikosyn. She may continue to have PAF but is stable with it and needs to be on coumadin for AVR anyway   AVR: 18 mm On-X valve normal function on TEE 10/15/22 INR goal with this valve lower 1.5-2.0   Coarctation:  Disagree with decision make to not repair coarctation at time of surgery for aneurysm and bicuspid valve at some point will need f/u MRI/MRA   HTN:  Well controlled.  Continue current medications and low sodium Dash type diet.    Aneursym:  In setting of bicuspid valve post  arch repair with graft.   Neuropathy/Rash:  Bilateral painful legs. F/U Rheumatology as her vasculitis may not be ideally Rx She only takes pulsed steroids. She is afraid to take MTX. May be paraneoplastic syndrome from cancer   COPD:  ? From 2nd hand smoke and wood burning stove non smoker F/U pulmonary continue inhalers and  oxygen PFTls,severe obstruction moderate diffusion defect f/u Dr Francine Graven  See below   Edema:  don't think it is cardiac EF normal RV normal no evidence of pulmonary hypertension She has had some edema since diagnosis of vasculitis US LE;s negative DVT in 2021  she has been on Rx coumadin for her valve and DVT unlikely PRN lasix   Lymphoma:  abnormal PET/CT 06/18/22 post right inguinal node biopsy Stage 4 Follicular lymphoma Rx with Bendamustine and Rituximab  Anxiety/Depression:  ? Start Paxil in place of Zoloft Has xanax and is doing much better now   Orhto:  L5/T10 compression fracture seeing surgery for possible kyphoplasty would have to hold coumadin 3 days before surgery   Preoperative:  Clear to have kyphoplasty with Dr Conchita Paris Will need to hold coumadin 5 days before and cover with lovenox if needed with last dose evening before surgery INR over 6 last week has f/u with Vashti Hey in clinic and she can help coordinate lovenox and checking INR    F/U with me in 6 months   Charlton Haws

## 2022-10-21 ENCOUNTER — Encounter (HOSPITAL_COMMUNITY): Payer: Self-pay | Admitting: Cardiology

## 2022-10-21 ENCOUNTER — Ambulatory Visit: Payer: Medicaid Other

## 2022-10-21 NOTE — Telephone Encounter (Signed)
Pt already had an appointment with Dr. Elberta Fortis 10/28/22, clearance will be addressed at that time.  Will route back to the requesting surgeon's office to make them aware.

## 2022-10-21 NOTE — Telephone Encounter (Signed)
   Name: Mallory Sutton  DOB: 1960/11/10  MRN: [id number]  Primary Cardiologist: Charlton Haws, MD  Chart reviewed as part of pre-operative protocol coverage. Because of Mallory Sutton's past medical history and time since last visit, she will require a follow-up in-office visit in order to better assess preoperative cardiovascular risk.  Patient was recently discharged from the hospital.  Preop telephone visit was scheduled prior to this hospitalization.  Please cancel preop telephone visit scheduled for 10/21/2022.  Pre-op covering staff: - Please schedule appointment and call patient to inform them. If patient already had an upcoming appointment within acceptable timeframe, please add "pre-op clearance" to the appointment notes so provider is aware. - Please contact requesting surgeon's office via preferred method (i.e, phone, fax) to inform them of need for appointment prior to surgery.  Joylene Grapes, NP  10/21/2022, 1:54 PM

## 2022-10-23 ENCOUNTER — Telehealth: Payer: Self-pay | Admitting: Internal Medicine

## 2022-10-23 DIAGNOSIS — J449 Chronic obstructive pulmonary disease, unspecified: Secondary | ICD-10-CM

## 2022-10-23 NOTE — Telephone Encounter (Signed)
Called patient about her message. Patient stated she is back in A.FIB and her HR is staying in the 140's. Patient is symptomatic with SOB and a little chest tightness.  Patient took an extra metoprolol 25 mg about an hour ago. Patient stated it has not really helped. Patient thinks it might be the chemo that caused her to go back into A. FIB. Patient stated her last cardioversion lasted for a week before she went back into A. FIB. Encouraged patient to go to ED. Patient stated she will go to ED if HR does not come down soon with extra metoprolol.   Patient also wanted to get referred to another pulmonologist but she could not remember who Dr. Eden Emms recommended. Will send message to Dr. Eden Emms for advisement.

## 2022-10-23 NOTE — Telephone Encounter (Signed)
Patient sent in message via MyChart:  Wanted to let my electrical doctors know that I went back into afib last night  6/20.  I spent two nights at [facility] last week and had a cardioversion done on the 13th.  Lasted almost exactly a week.   If someone could please make sure they know about this I would greatly appreciate it.  I have an Urgent visit with Dr. Elberta Fortis at 3:45 on the 26th.  I also have an appointment with Dr. Eden Emms at 11:30 on the 28th.   I also see my primary on the 28th.  I will write to Dr. Eden Emms also.  Thank you so much! Mallory Sutton    Pt answered questions for palpitations, please see below:  Patient c/o Palpitations:  High priority if patient c/o lightheadedness, shortness of breath, or chest pain  How long have you had palpitations/irregular HR/ Afib? Are you having the symptoms now? I've had afib for 23 years. This bout started last night around 10pm.   Are you currently experiencing lightheadedness, SOB or CP? I am having shortness of breath but that has been worse all week. Everything I do leaves me winded. No chest pain per se but I'm having chest tightness in my chest.  Do you have a history of afib (atrial fibrillation) or irregular heart rhythm? I've had afib since March 2001. 23 years.   Have you checked your BP or HR? (document readings if available): HR at this moment is 144.   Are you experiencing any other symptoms? I'm exhausted from the chemo and most food tastes weird.  But I know that will pass with the food. I think the exhaustion is mine to keep for a while. I also have a headache which I am not prone to. I'm just really overwhelmed and it just keep getting worse. Held together by duct tape and medicine.

## 2022-10-26 NOTE — Telephone Encounter (Signed)
Mallory Stade, MD  to Me     10/23/22  8:01 PM  Defer to EP regarding her recurrent afib needs to see them again ASAp can refer to Jeannetta Ellis for lungs  Informed patient of Dr. Fabio Bering advisement. Patient agreed to plan. Patient seeing Dr. Elberta Fortis on Wednesday and referral placed for Dr. Vassie Loll. Patient stated that her HR is still elevated with extra metoprolol. Encouraged patient to go to ED if HR does not come down. Patient verbalized understanding.

## 2022-10-27 ENCOUNTER — Ambulatory Visit: Payer: Medicaid Other | Attending: Cardiovascular Disease | Admitting: *Deleted

## 2022-10-27 DIAGNOSIS — Q231 Congenital insufficiency of aortic valve: Secondary | ICD-10-CM | POA: Diagnosis not present

## 2022-10-27 DIAGNOSIS — I4892 Unspecified atrial flutter: Secondary | ICD-10-CM

## 2022-10-27 DIAGNOSIS — Z5181 Encounter for therapeutic drug level monitoring: Secondary | ICD-10-CM | POA: Diagnosis not present

## 2022-10-27 LAB — POCT INR: INR: 6.3 — AB (ref 2.0–3.0)

## 2022-10-27 NOTE — Patient Instructions (Signed)
Hold warfarin tonight and tomorrow night then decrease dose to 3 tablets daily  Next chemo 7/2 & 7/3 Recheck INR in 1 wk

## 2022-10-28 ENCOUNTER — Encounter: Payer: Self-pay | Admitting: Cardiology

## 2022-10-28 ENCOUNTER — Telehealth: Payer: Self-pay | Admitting: *Deleted

## 2022-10-28 ENCOUNTER — Ambulatory Visit: Payer: Medicaid Other | Attending: Cardiology | Admitting: Cardiology

## 2022-10-28 VITALS — BP 122/80 | HR 125 | Ht 64.0 in | Wt 107.0 lb

## 2022-10-28 DIAGNOSIS — D6869 Other thrombophilia: Secondary | ICD-10-CM

## 2022-10-28 DIAGNOSIS — I4892 Unspecified atrial flutter: Secondary | ICD-10-CM

## 2022-10-28 DIAGNOSIS — I4891 Unspecified atrial fibrillation: Secondary | ICD-10-CM

## 2022-10-28 MED ORDER — METOPROLOL SUCCINATE ER 100 MG PO TB24: 150.0000 mg | ORAL_TABLET | Freq: Every day | ORAL | 3 refills | Status: DC

## 2022-10-28 NOTE — Patient Instructions (Signed)
Medication Instructions:  Your physician recommends that you continue on your current medications as directed. Please refer to the Current Medication list given to you today.  *If you need a refill on your cardiac medications before your next appointment, please call your pharmacy*   Lab Work: None ordered   Testing/Procedures: None ordered   Follow-Up: At [facility], you and your health needs are our priority.  As part of our continuing mission to provide you with exceptional heart care, we have created designated Provider Care Teams.  These Care Teams include your primary Cardiologist (physician) and Advanced Practice Providers (APPs -  Physician Assistants and Nurse Practitioners) who all work together to provide you with the care you need, when you need it.  Your next appointment:   To be  determined  after Dr. Elberta Fortis speak with Dr. Ladona Ridgel  The format for your next appointment:   In Person  Provider:   Loman Brooklyn, MD    Thank you for choosing [facility]!!   Dory Horn, RN [phone]

## 2022-10-28 NOTE — Telephone Encounter (Signed)
Pt aware Dr. Ladona Ridgel & Camnitz spoke. Pt aware she is not an ablation candidate. Advised to increase Toprol to 150 mg. Keep follow up with Dr. Ladona Ridgel next week. She will let them know to send in updated Rx at next weeks appt (in case plan of care changes. She will use what she has at home for now) Patient verbalized understanding and agreeable to plan.

## 2022-10-28 NOTE — Progress Notes (Unsigned)
  Electrophysiology Office Note:   Date:  10/29/2022  ID:  Mallory Sutton, DOB January 30, 1961, MRN [id number]  Primary Cardiologist: Charlton Haws, MD Electrophysiologist: Lewayne Bunting, MD      History of Present Illness:   Mallory Sutton is a 62 y.o. female with h/o atrial fibrillation seen today for routine electrophysiology followup.  Since last being seen in our clinic the patient reports doing decently.  She has a long medical history including lung issues requiring supplemental oxygen.  She states that she does not smoke, but does have obstructive disease on PFTs.  She also has lymphoma and is undergoing chemotherapy.  She is back in atrial flutter today.  She had a recent cardioversion.  She is currently on dofetilide.  She feels that she was in normal rhythm for only a few days after her most recent cardioversion.  She does not notice much change in her overall wellbeing post cardioversion.  she denies chest pain, palpitations, dyspnea, PND, orthopnea, nausea, vomiting, dizziness, syncope, edema, weight gain, or early satiety.   Review of systems complete and found to be negative unless listed in HPI.   Studies Reviewed:    EKG is ordered today. Personal review as below.  EKG Interpretation  Date/Time:  Wednesday October 28 2022 16:25:40 EDT Ventricular Rate:  125 PR Interval:    QRS Duration: 76 QT Interval:  310 QTC Calculation: 447 R Axis:   -35 Text Interpretation: Atrial flutter with variable A-V block Left axis deviation When compared with ECG of 14-Oct-2022 18:25, No significant change was found Confirmed by Cherokee Boccio ([id number]) on 10/28/2022 4:38:44 PM     Risk Assessment/Calculations:    CHA2DS2-VASc Score = 3   This indicates a 3.2% annual risk of stroke. The patient's score is based upon: CHF History: 1 HTN History: 1 Diabetes History: 0 Stroke History: 0 Vascular Disease History: 0 Age Score: 0 Gender Score: 1             Physical Exam:   VS:  BP 122/80   Pulse  (!) 125   Ht 5\' 4"  (1.626 m)   Wt 107 lb (48.5 kg)   LMP 07/16/2013   SpO2 99%   BMI 18.37 kg/m    Wt Readings from Last 3 Encounters:  10/28/22 107 lb (48.5 kg)  10/16/22 114 lb 10.2 oz (52 kg)  10/06/22 110 lb 6.4 oz (50.1 kg)     GEN: Well nourished, well developed in no acute distress NECK: No JVD; No carotid bruits CARDIAC: Irregularly irregular rate and rhythm, no murmurs, rubs, gallops RESPIRATORY:  Clear to auscultation without rales, wheezing or rhonchi  ABDOMEN: Soft, non-tender, non-distended EXTREMITIES:  No edema; No deformity   ASSESSMENT AND PLAN:    1.  Persistent atrial fibrillation/flutter: Currently on dofetilide and warfarin.  QTc is remained stable.  She is unfortunately continued to have episodes of atrial fibrillation.  Due to her chronic medical problems, I do not think that ablation is necessarily the best option for her.  Deyani Hegarty refer back to her primary electrophysiologist for discussion of AV node ablation and pacemaker.  Laquandra Carrillo increase metoprolol to 75 mg twice daily.  2.  Secondary hypercoagulable state: Currently on warfarin for atrial fibrillation  Follow up with Dr. Ladona Ridgel  at next available visit   Signed, Abdifatah Colquhoun Jorja Loa, MD

## 2022-10-29 ENCOUNTER — Encounter: Payer: Self-pay | Admitting: Cardiology

## 2022-10-30 ENCOUNTER — Encounter: Payer: Self-pay | Admitting: Cardiovascular Disease

## 2022-10-30 ENCOUNTER — Ambulatory Visit: Payer: Medicaid Other | Attending: Cardiovascular Disease | Admitting: Cardiovascular Disease

## 2022-10-30 VITALS — BP 124/84 | HR 72 | Ht 64.0 in | Wt 108.8 lb

## 2022-10-30 DIAGNOSIS — I4891 Unspecified atrial fibrillation: Secondary | ICD-10-CM

## 2022-10-30 DIAGNOSIS — Z5181 Encounter for therapeutic drug level monitoring: Secondary | ICD-10-CM

## 2022-10-30 DIAGNOSIS — Z7901 Long term (current) use of anticoagulants: Secondary | ICD-10-CM

## 2022-10-30 DIAGNOSIS — Z952 Presence of prosthetic heart valve: Secondary | ICD-10-CM

## 2022-10-30 DIAGNOSIS — I776 Arteritis, unspecified: Secondary | ICD-10-CM

## 2022-10-30 DIAGNOSIS — Q251 Coarctation of aorta: Secondary | ICD-10-CM | POA: Diagnosis not present

## 2022-10-30 NOTE — Patient Instructions (Addendum)
Medication Instructions:  Your physician recommends that you continue on your current medications as directed. Please refer to the Current Medication list given to you today.   Labwork: None today  Testing/Procedures: None today  Follow-Up: 3 months Dr.Nishan  Any Other Special Instructions Will Be Listed Below (If Applicable).  If you need a refill on your cardiac medications before your next appointment, please call your pharmacy.

## 2022-10-31 ENCOUNTER — Other Ambulatory Visit: Payer: Self-pay

## 2022-11-02 ENCOUNTER — Other Ambulatory Visit: Payer: Self-pay

## 2022-11-02 NOTE — Telephone Encounter (Signed)
Update to clearance: pt with increased afib burden, hospitalized 6/12 for this after initial clearance provided. Per Dr Eden Emms, pt to be bridged with Lovenox now for upcoming procedure. Misty Stanley, RN in [place] coumadin clinic is aware, pt has INR check scheduled for tomorrow and bridge will be coordinated then.

## 2022-11-02 NOTE — Telephone Encounter (Signed)
   Patient Name: Mallory Sutton  DOB: 1960-09-22 MRN: [id number]  Primary Cardiologist: Charlton Haws, MD  Chart reviewed as part of pre-operative protocol coverage.  Updated clearance information reviewed and no further information or changes needed at this time.  Napoleon Form, Leodis Rains, NP 11/02/2022, 9:35 AM

## 2022-11-03 ENCOUNTER — Inpatient Hospital Stay: Payer: Medicaid Other

## 2022-11-03 ENCOUNTER — Inpatient Hospital Stay: Payer: Medicaid Other | Admitting: Hematology

## 2022-11-03 ENCOUNTER — Ambulatory Visit: Payer: Medicaid Other | Attending: Cardiovascular Disease | Admitting: *Deleted

## 2022-11-03 ENCOUNTER — Inpatient Hospital Stay: Payer: Medicaid Other | Attending: Hematology

## 2022-11-03 VITALS — BP 105/66 | HR 99 | Temp 97.0°F | Resp 20

## 2022-11-03 VITALS — BP 107/66 | HR 106 | Temp 98.8°F | Resp 20 | Ht 64.0 in | Wt 107.5 lb

## 2022-11-03 DIAGNOSIS — Z95828 Presence of other vascular implants and grafts: Secondary | ICD-10-CM

## 2022-11-03 DIAGNOSIS — C8208 Follicular lymphoma grade I, lymph nodes of multiple sites: Secondary | ICD-10-CM

## 2022-11-03 DIAGNOSIS — Z5112 Encounter for antineoplastic immunotherapy: Secondary | ICD-10-CM | POA: Diagnosis present

## 2022-11-03 DIAGNOSIS — Z5181 Encounter for therapeutic drug level monitoring: Secondary | ICD-10-CM

## 2022-11-03 DIAGNOSIS — I4892 Unspecified atrial flutter: Secondary | ICD-10-CM | POA: Diagnosis not present

## 2022-11-03 DIAGNOSIS — Z5111 Encounter for antineoplastic chemotherapy: Secondary | ICD-10-CM | POA: Diagnosis present

## 2022-11-03 DIAGNOSIS — D649 Anemia, unspecified: Secondary | ICD-10-CM | POA: Insufficient documentation

## 2022-11-03 DIAGNOSIS — E876 Hypokalemia: Secondary | ICD-10-CM | POA: Insufficient documentation

## 2022-11-03 DIAGNOSIS — Z5189 Encounter for other specified aftercare: Secondary | ICD-10-CM | POA: Insufficient documentation

## 2022-11-03 DIAGNOSIS — Q231 Congenital insufficiency of aortic valve: Secondary | ICD-10-CM | POA: Diagnosis not present

## 2022-11-03 LAB — CBC WITH DIFFERENTIAL/PLATELET
Abs Immature Granulocytes: 0.06 10*3/uL (ref 0.00–0.07)
Basophils Absolute: 0.1 10*3/uL (ref 0.0–0.1)
Basophils Relative: 1 %
Eosinophils Absolute: 0.3 10*3/uL (ref 0.0–0.5)
Eosinophils Relative: 4 %
HCT: 27.7 % — ABNORMAL LOW (ref 36.0–46.0)
Hemoglobin: 8.5 g/dL — ABNORMAL LOW (ref 12.0–15.0)
Immature Granulocytes: 1 %
Lymphocytes Relative: 5 %
Lymphs Abs: 0.4 10*3/uL — ABNORMAL LOW (ref 0.7–4.0)
MCH: 25.1 pg — ABNORMAL LOW (ref 26.0–34.0)
MCHC: 30.7 g/dL (ref 30.0–36.0)
MCV: 82 fL (ref 80.0–100.0)
Monocytes Absolute: 1 10*3/uL (ref 0.1–1.0)
Monocytes Relative: 11 %
Neutro Abs: 6.9 10*3/uL (ref 1.7–7.7)
Neutrophils Relative %: 78 %
Platelets: 314 10*3/uL (ref 150–400)
RBC: 3.38 MIL/uL — ABNORMAL LOW (ref 3.87–5.11)
RDW: 17 % — ABNORMAL HIGH (ref 11.5–15.5)
WBC: 8.7 10*3/uL (ref 4.0–10.5)
nRBC: 0 % (ref 0.0–0.2)

## 2022-11-03 LAB — POCT INR: INR: 2 (ref 2.0–3.0)

## 2022-11-03 LAB — COMPREHENSIVE METABOLIC PANEL
ALT: 15 U/L (ref 0–44)
AST: 22 U/L (ref 15–41)
Albumin: 3.6 g/dL (ref 3.5–5.0)
Alkaline Phosphatase: 93 U/L (ref 38–126)
Anion gap: 11 (ref 5–15)
BUN: 11 mg/dL (ref 6–20)
CO2: 32 mmol/L (ref 22–32)
Calcium: 8.3 mg/dL — ABNORMAL LOW (ref 8.9–10.3)
Chloride: 92 mmol/L — ABNORMAL LOW (ref 98–111)
Creatinine, Ser: 0.75 mg/dL (ref 0.44–1.00)
GFR, Estimated: >60 mL/min (ref >60)
Glucose, Bld: 144 mg/dL — ABNORMAL HIGH (ref 70–99)
Potassium: 2.5 mmol/L — CL (ref 3.5–5.1)
Sodium: 135 mmol/L (ref 135–145)
Total Bilirubin: 0.6 mg/dL (ref 0.3–1.2)
Total Protein: 6.2 g/dL — ABNORMAL LOW (ref 6.5–8.1)

## 2022-11-03 LAB — MAGNESIUM: Magnesium: 2 mg/dL (ref 1.7–2.4)

## 2022-11-03 LAB — URIC ACID: Uric Acid, Serum: 3.5 mg/dL (ref 2.5–7.1)

## 2022-11-03 MED ORDER — POTASSIUM CHLORIDE IN NACL 20-0.9 MEQ/L-% IV SOLN
INTRAVENOUS | Status: DC
  Filled 2022-11-03 (×2): qty 1000

## 2022-11-03 MED ORDER — SODIUM CHLORIDE 0.9 % IV SOLN: Freq: Once | INTRAVENOUS | Status: AC

## 2022-11-03 MED ORDER — CETIRIZINE HCL 10 MG/ML IV SOLN
10.0000 mg | Freq: Once | INTRAVENOUS | Status: AC
  Administered 2022-11-03: 10 mg via INTRAVENOUS
  Filled 2022-11-03: qty 1

## 2022-11-03 MED ORDER — PALONOSETRON HCL INJECTION 0.25 MG/5ML
0.2500 mg | Freq: Once | INTRAVENOUS | Status: AC
  Administered 2022-11-03: 0.25 mg via INTRAVENOUS
  Filled 2022-11-03: qty 5

## 2022-11-03 MED ORDER — POTASSIUM CHLORIDE CRYS ER 20 MEQ PO TBCR
40.0000 meq | EXTENDED_RELEASE_TABLET | Freq: Once | ORAL | Status: AC
  Administered 2022-11-03: 40 meq via ORAL
  Filled 2022-11-03: qty 2

## 2022-11-03 MED ORDER — SODIUM CHLORIDE 0.9 % IV SOLN
60.0000 mg/m2 | Freq: Once | INTRAVENOUS | Status: AC
  Administered 2022-11-03: 87.5 mg via INTRAVENOUS
  Filled 2022-11-03: qty 3.5

## 2022-11-03 MED ORDER — HEPARIN SOD (PORK) LOCK FLUSH 100 UNIT/ML IV SOLN
500.0000 [IU] | Freq: Once | INTRAVENOUS | Status: AC | PRN
  Administered 2022-11-03: 500 [IU]

## 2022-11-03 MED ORDER — ACETAMINOPHEN 325 MG PO TABS
650.0000 mg | ORAL_TABLET | Freq: Once | ORAL | Status: AC
  Administered 2022-11-03: 650 mg via ORAL
  Filled 2022-11-03: qty 2

## 2022-11-03 MED ORDER — SODIUM CHLORIDE 0.9 % IV SOLN
375.0000 mg/m2 | Freq: Once | INTRAVENOUS | Status: AC
  Administered 2022-11-03: 500 mg via INTRAVENOUS
  Filled 2022-11-03: qty 50

## 2022-11-03 MED ORDER — SODIUM CHLORIDE 0.9% FLUSH
10.0000 mL | INTRAVENOUS | Status: DC | PRN
  Administered 2022-11-03: 10 mL

## 2022-11-03 MED ORDER — FAMOTIDINE IN NACL 20-0.9 MG/50ML-% IV SOLN
20.0000 mg | Freq: Once | INTRAVENOUS | Status: AC
  Administered 2022-11-03: 20 mg via INTRAVENOUS
  Filled 2022-11-03: qty 50

## 2022-11-03 MED ORDER — POTASSIUM CHLORIDE CRYS ER 20 MEQ PO TBCR
40.0000 meq | EXTENDED_RELEASE_TABLET | Freq: Two times a day (BID) | ORAL | Status: DC
  Administered 2022-11-03: 40 meq via ORAL
  Filled 2022-11-03: qty 2

## 2022-11-03 MED ORDER — SODIUM CHLORIDE 0.9 % IV SOLN
10.0000 mg | Freq: Once | INTRAVENOUS | Status: AC
  Administered 2022-11-03: 10 mg via INTRAVENOUS
  Filled 2022-11-03: qty 1

## 2022-11-03 NOTE — Patient Instructions (Addendum)
[facility]  Discharge Instructions: Thank you for choosing [facility] to provide your oncology and hematology care.  If you have a lab appointment with the Cancer Center - please note that after April 8th, 2024, all labs will be drawn in the cancer center.  You do not have to check in or register with the main entrance as you have in the past but will complete your check-in in the cancer center.  Wear comfortable clothing and clothing appropriate for easy access to any Portacath or PICC line.   We strive to give you quality time with your provider. You may need to reschedule your appointment if you arrive late (15 or more minutes).  Arriving late affects you and other patients whose appointments are after yours.  Also, if you miss three or more appointments without notifying the office, you may be dismissed from the clinic at the provider's discretion.      For prescription refill requests, have your pharmacy contact our office and allow 72 hours for refills to be completed.    Today you received the following chemotherapy and/or immunotherapy agents Rituximab/bendeka     To help prevent nausea and vomiting after your treatment, we encourage you to take your nausea medication as directed.  BELOW ARE SYMPTOMS THAT SHOULD BE REPORTED IMMEDIATELY: *FEVER GREATER THAN 100.4 F (38 C) OR HIGHER *CHILLS OR SWEATING *NAUSEA AND VOMITING THAT IS NOT CONTROLLED WITH YOUR NAUSEA MEDICATION *UNUSUAL SHORTNESS OF BREATH *UNUSUAL BRUISING OR BLEEDING *URINARY PROBLEMS (pain or burning when urinating, or frequent urination) *BOWEL PROBLEMS (unusual diarrhea, constipation, pain near the anus) TENDERNESS IN MOUTH AND THROAT WITH OR WITHOUT PRESENCE OF ULCERS (sore throat, sores in mouth, or a toothache) UNUSUAL RASH, SWELLING OR PAIN  UNUSUAL VAGINAL DISCHARGE OR ITCHING   Items with * indicate a potential emergency and should be followed up as soon as possible or go  to the Emergency Department if any problems should occur.  Please show the CHEMOTHERAPY ALERT CARD or IMMUNOTHERAPY ALERT CARD at check-in to the Emergency Department and triage nurse.  Should you have questions after your visit or need to cancel or reschedule your appointment, please contact [facility] [phone]  and follow the prompts.  Office hours are 8:00 a.m. to 4:30 p.m. Monday - Friday. Please note that voicemails left after 4:00 p.m. may not be returned until the following business day.  We are closed weekends and major holidays. You have access to a nurse at all times for urgent questions. Please call the main number to the clinic [phone] and follow the prompts.  For any non-urgent questions, you may also contact your provider using MyChart. We now offer e-Visits for anyone 64 and older to request care online for non-urgent symptoms. For details visit [email or web address].   Also download the MyChart app! Go to the app store, search "MyChart", open the app, select [place], and log in with your MyChart username and password.

## 2022-11-03 NOTE — Progress Notes (Signed)
Patient presents today for Rituximab/Bendeka infusions per providers order.  Vital signs and labs reviewed by MD.  Message received from Chapman Moss RN/Dr. Ellin Saba patient okay for treatment.  Patient also to receive 20 mEq IV Potassium and 80 mEq of PO Potassium for a Potassium level of 2.5, per MD order.  Treatment given today per MD orders.  Stable during infusion without adverse affects.  Vital signs stable.  No complaints at this time.  Discharge from clinic via wheelchair in stable condition.  Alert and oriented X 3.  Follow up with [facility] as scheduled.

## 2022-11-03 NOTE — Progress Notes (Signed)
CRITICAL VALUE ALERT Critical value received:  potassium 2.5.  Date of notification:  11-03-2022 Time of notification: 09:40 am.  Critical value read back:  Yes.   Nurse who received alert:  B. Evanna Washinton RN.  MD notified time and response:  Dr. Ellin Saba @ 09:42 am. Standing orders followed. Patient will receive 20 mEq IV potassium and 40 mEq PO  pre and post IV potassium.

## 2022-11-03 NOTE — Progress Notes (Signed)
Mallory Sutton [address] [place] [address]    Clinic Day:  11/03/2022  Referring physician: Leone Payor, FNP  Patient Care Team: Leone Payor, FNP as PCP - General (Family Medicine) Wendall Stade, MD as PCP - Cardiology (Cardiology) Benita Stabile, MD as PCP - Internal Medicine (Internal Medicine) Marinus Maw, MD as PCP - Electrophysiology (Cardiology) Pricilla Riffle, MD as Consulting Physician (Cardiology) Doreatha Massed, MD as Medical Oncologist (Medical Oncology) Therese Sarah, RN as Oncology Nurse Navigator (Medical Oncology)   ASSESSMENT & PLAN:   Assessment: 1.  Stage IV follicular lymphoma, grade 1: - CT chest (06/03/2022): Newly enlarged bilateral axillary, subpectoral, low cervical, mediastinal, hilar and most likely upper abdominal lymph nodes.  No evidence of fibrotic interstitial lung disease. - PET scan (06/18/2022): Generalized lymphadenopathy more in the abdomen and retroperitoneum but seen throughout the low neck, chest, abdomen and pelvis, with SUV ranging between 2-4.  Spleen top normal size at 12 cm with FDG uptake slightly less than the liver.  Hepatic steatosis.  Low-level diffuse FDG activity within the marrow spaces throughout the spine and pelvis with no focal area of increased metabolic activity. - No B symptoms.  She is having more night sweats lately every night. 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  Also has paroxysmal AF. - Right groin lymph node biopsy (06/25/2022): Follicular lymphoma, grade 1 - She complains of severe fatigue for the last 6 to 8 months, gradually worsening and interfering with her day-to-day activities. - BMBX (08/10/2022): Hypercellular bone marrow with involvement by follicular lymphoma (70%) - Cycle 1 of Bendamustine and rituximab on 09/08/2022   2.   Social/family history: - Lives at home with her husband and son.  Seen with daughter Irving Burton today.  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  No chemical exposure.  Non-smoker.  Secondhand smoke exposure present. - Maternal grandmother died of leukemia.  Maternal grandfather had lung cancer.  Paternal grandfather had lung cancer.  Paternal great aunt had cancer.  Maternal uncle had breast cancer x 2.    Plan: 1.  Stage IV follicular lymphoma, grade 1: - Cycle 2 of dose attenuated Bendamustine and rituximab on 10/06/2022. - She felt more tired in the first 2 weeks. - She has vomited once but denied any diarrhea. - Reviewed labs today: Normal LFTs and creatinine.  CBC grossly normal with normocytic anemia with hemoglobin 8.5.  Differential normal. - Recommend proceeding with Bendamustine cycle 3 at 60 mg/m.  RTC 4 weeks for follow-up with PET scan 1 week prior.   2.  Worsening shortness of breath: - Continue prednisone 20 mg daily which was started on 04/30/2022.   3.  Hypokalemia: - She is taking potassium 40 mEq 3 times daily.  Potassium today is 2.5.  Will replete potassium in the clinic today.  Will check potassium again tomorrow.   4.  Nausea/vomiting prophylaxis: - She cannot take any antiemetics due to potential QT prolongation. - Continue scopolamine patch.  5.  Back pain: - This is from vertebral compression fracture.  Continue Percocet 10/325 every 6 hours as needed.  She will have vertebroplasty done.    Orders Placed This Encounter  Procedures   NM PET Image Restag (PS) Skull Base To Thigh    Standing Status:  Future    Standing Expiration Date:   11/02/2023    Order Specific Question:   If indicated for the ordered procedure, I authorize the administration of a radiopharmaceutical per Radiology protocol    Answer:   Yes    Order Specific Question:   Is the patient pregnant?    Answer:   No    Order Specific Question:    Preferred imaging location?    Answer:   [facility]    Order Specific Question:   Release to patient    Answer:   Immediate      I,Katie Daubenspeck,acting as a scribe for Doreatha Massed, MD.,have documented all relevant documentation on the behalf of Doreatha Massed, MD,as directed by  Doreatha Massed, MD while in the presence of Doreatha Massed, MD.   I, Doreatha Massed MD, have reviewed the above documentation for accuracy and completeness, and I agree with the above.   Doreatha Massed, MD   7/2/20246:59 PM  CHIEF COMPLAINT:   Diagnosis: Grade 1 stage III follicular lymphoma    Cancer Staging  Follicular lymphoma grade I (HCC) Staging form: Hodgkin and Non-Hodgkin Lymphoma, AJCC 8th Edition - Clinical stage from 07/05/2022: Stage IV (Follicular lymphoma) - Unsigned    Prior Therapy: none  Current Therapy:  Bendamustine and rituximab    HISTORY OF PRESENT ILLNESS:   Oncology History  Follicular lymphoma grade I (HCC)  07/05/2022 Initial Diagnosis   Follicular lymphoma grade I (HCC)   09/08/2022 -  Chemotherapy   Patient is on Treatment Plan : NON-HODGKINS LYMPHOMA Rituximab D1 + Bendamustine D1,2 q28d x 6 cycles        INTERVAL HISTORY:   Mallory Sutton is a 62 y.o. female presenting to clinic today for follow up of Grade 1 stage III follicular lymphoma. She was last seen by me on 10/06/22.  Since her last visit, she presented to the ED on 10/15/22 with palpitations. She was admitted with atrial fibrillation with RVR and underwent successful TEE/cardioversion on 10/15/22.  Today, she states that she is doing well overall. Her appetite level is at 75%. Her energy level is at 25%.  PAST MEDICAL HISTORY:   Past Medical History: Past Medical History:  Diagnosis Date   Aortic stenosis    ATRIAL ARRHYTHMIAS    Bicuspid aortic valve    CHF (congestive heart failure) (HCC)    COARCTATION OF AORTA    Dysrhythmia    ENDOMETRIOSIS    Heart murmur     Lymphoma (HCC)    Paroxysmal atrial fibrillation (HCC)    Port-A-Cath in place 08/31/2022   Shortness of breath    THORACIC AORTIC ANEURYSM    Vasculitis (HCC)     Surgical History: Past Surgical History:  Procedure Laterality Date   CARDIOVERSION N/A 08/29/2019   Procedure: CARDIOVERSION;  Surgeon: Antoine Poche, MD;  Location: [facility];  Service: Endoscopy;  Laterality: N/A;   CARDIOVERSION N/A 07/16/2022   Procedure: CARDIOVERSION;  Surgeon: Sande Rives, MD;  Location: [facility]COPY;  Service: Cardiovascular;  Laterality: N/A;   CARDIOVERSION N/A 10/15/2022   Procedure: CARDIOVERSION;  Surgeon: Antoine Poche, MD;  Location: [facility];  Service: Endoscopy;  Laterality: N/A;   coarctation repair and residual restenosis     IR BONE MARROW BIOPSY & ASPIRATION  08/10/2022   IR IMAGING GUIDED PORT INSERTION  08/10/2022   KNEE SURGERY     LYMPH NODE BIOPSY Right 06/25/2022   Procedure: LYMPH NODE BIOPSY; right groin;  Surgeon: Lucretia Roers,  MD;  Location: [facility];  Service: General;  Laterality: Right;   PORTA CATH INSERTION     TEE WITHOUT CARDIOVERSION N/A 07/12/2019   Procedure: TRANSESOPHAGEAL ECHOCARDIOGRAM (TEE);  Surgeon: Quintella Reichert, MD;  Location: [facility]COPY;  Service: Cardiovascular;  Laterality: N/A;   TEE WITHOUT CARDIOVERSION N/A 08/29/2019   Procedure: TRANSESOPHAGEAL ECHOCARDIOGRAM (TEE) WITH PROPOFOL;  Surgeon: Antoine Poche, MD;  Location: [facility];  Service: Endoscopy;  Laterality: N/A;   TEE WITHOUT CARDIOVERSION N/A 10/15/2022   Procedure: TRANSESOPHAGEAL ECHOCARDIOGRAM (TEE);  Surgeon: Antoine Poche, MD;  Location: [facility];  Service: Endoscopy;  Laterality: N/A;    Social History: Social History   Socioeconomic History   Marital status: Married    Spouse name: Not on file   Number of children: Not on file   Years of education: Not on file   Highest education level: Not on file  Occupational History   Not on file  Tobacco Use    Smoking status: Never    Passive exposure: Past   Smokeless tobacco: Never   Tobacco comments:    Never smoke 07/14/22  Vaping Use   Vaping Use: Never used  Substance and Sexual Activity   Alcohol use: Yes    Comment: 3 beers 3 nights a week 07/14/22   Drug use: No   Sexual activity: Never  Other Topics Concern   Not on file  Social History Narrative   Not on file   Social Determinants of Health   Financial Resource Strain: High Risk (07/02/2022)   Overall Financial Resource Strain (CARDIA)    Difficulty of Paying Living Expenses: Very hard  Food Insecurity: No Food Insecurity (10/15/2022)   Hunger Vital Sign    Worried About Running Out of Food in the Last Year: Never true    Ran Out of Food in the Last Year: Never true  Transportation Needs: No Transportation Needs (10/15/2022)   PRAPARE - Administrator, Civil Service (Medical): No    Lack of Transportation (Non-Medical): No  Physical Activity: Not on file  Stress: Not on file  Social Connections: Not on file  Intimate Partner Violence: Not At Risk (10/15/2022)   Humiliation, Afraid, Rape, and Kick questionnaire    Fear of Current or Ex-Partner: No    Emotionally Abused: No    Physically Abused: No    Sexually Abused: No    Family History: Family History  Problem Relation Age of Onset   Suicidality Mother    Mental illness Mother    Drug abuse Brother    Healthy Daughter    Healthy Son     Current Medications:  Current Outpatient Medications:    acetaminophen (TYLENOL) 325 MG tablet, Take 2 tablets (650 mg total) by mouth every 6 (six) hours as needed for mild pain (or Fever >/= 101)., Disp: 100 tablet, Rfl: 1   albuterol (VENTOLIN HFA) 108 (90 Base) MCG/ACT inhaler, Inhale 2 puffs into the lungs every 4 (four) hours as needed for wheezing or shortness of breath., Disp: 54 g, Rfl: 6   allopurinol (ZYLOPRIM) 300 MG tablet, Take 1 tablet (300 mg total) by mouth daily., Disp: 30 tablet, Rfl: 3    ALPRAZolam (XANAX) 0.5 MG tablet, Take 0.5 mg by mouth 3 (three) times daily., Disp: , Rfl:    aspirin EC 81 MG tablet, Take 1 tablet (81 mg total) by mouth daily with breakfast., Disp: 30 tablet, Rfl: 11   BENDAMUSTINE HCL IV, Inject into the vein  every 28 (twenty-eight) days. Days 1&2 every 28 days, Disp: , Rfl:    Budeson-Glycopyrrol-Formoterol (BREZTRI AEROSPHERE) 160-9-4.8 MCG/ACT AERO, Inhale 2 puffs into the lungs 2 (two) times daily., Disp: 10.7 g, Rfl: 1   calcium-vitamin D (OSCAL WITH D) 500-5 MG-MCG tablet, Take 1 tablet by mouth daily., Disp: 30 tablet, Rfl: 2   dofetilide (TIKOSYN) 250 MCG capsule, Take 1 capsule (250 mcg total) by mouth 2 (two) times daily., Disp: 60 capsule, Rfl: 6   feeding supplement (ENSURE ENLIVE / ENSURE PLUS) LIQD, Take 237 mLs by mouth 2 (two) times daily between meals., Disp: , Rfl:    furosemide (LASIX) 40 MG tablet, Take 2 tablets (80 mg total) by mouth daily., Disp: 30 tablet, Rfl: 3   gabapentin (NEURONTIN) 300 MG capsule, Take 300-600 mg by mouth See admin instructions. Take 300 mg tablet by mouth in the morning, 300 mg tablet by mouth in the afternoon, and then take two of the 300 mg tablets by mouth for total of 600 mg in the evening per patient, Disp: , Rfl:    ipratropium-albuterol (DUONEB) 0.5-2.5 (3) MG/3ML SOLN, Inhale 3 mLs into the lungs 2 (two) times daily as needed (shortness of breath)., Disp: 360 mL, Rfl: 1   lidocaine-prilocaine (EMLA) cream, Apply a small amount to port a cath site and cover with plastic wrap 1 hour prior to infusion appointments, Disp: 30 g, Rfl: 3   magnesium oxide (MAGOX 400) 400 (240 Mg) MG tablet, Take 1 tablet (400 mg total) by mouth daily., Disp: 30 tablet, Rfl: 2   metoprolol succinate (TOPROL-XL) 100 MG 24 hr tablet, Take 1.5 tablets (150 mg total) by mouth daily. Take with or immediately following a meal., Disp: 135 tablet, Rfl: 3   Multiple Vitamin (MULTI-VITAMINS) TABS, Take 1 tablet by mouth daily. , Disp: , Rfl:     oxyCODONE-acetaminophen (PERCOCET) 10-325 MG tablet, TAKE 1 TABLET BY MOUTH EVERY 6 HOURS AS NEEDED FOR PAIN, Disp: 120 tablet, Rfl: 0   pantoprazole (PROTONIX) 40 MG tablet, Take 1 tablet (40 mg total) by mouth daily., Disp: 30 tablet, Rfl: 2   potassium chloride SA (KLOR-CON M) 20 MEQ tablet, Take 2 tablets (40 mEq total) by mouth 3 (three) times daily., Disp: 180 tablet, Rfl: 3   predniSONE (DELTASONE) 20 MG tablet, Take 1 tablet (20 mg total) by mouth daily with breakfast., Disp: 90 tablet, Rfl: 3   riTUXimab (RITUXAN IV), Inject into the vein every 28 (twenty-eight) days., Disp: , Rfl:    scopolamine (TRANSDERM-SCOP) 1 MG/3DAYS, Place 1 patch (1.5 mg total) onto the skin every 3 (three) days., Disp: 10 patch, Rfl: 0   venlafaxine (EFFEXOR) 75 MG tablet, Take 75 mg by mouth 2 (two) times daily., Disp: , Rfl:    warfarin (COUMADIN) 1 MG tablet, Take warfarin 4 to 5 tablets daily or as directed by coumadin clinic (Patient taking differently: Take 1 mg by mouth as directed. Take warfarin 4 to 5 tablets daily or as directed by coumadin clinic), Disp: 135 tablet, Rfl: 5   zolpidem (AMBIEN) 10 MG tablet, Take 1 tablet (10 mg total) by mouth at bedtime as needed for sleep., Disp: 10 tablet, Rfl: 0   Allergies: Allergies  Allergen Reactions   Amiodarone Nausea Only   Penicillins Hives    Has patient had a PCN reaction causing immediate rash, facial/tongue/throat swelling, SOB or lightheadedness with hypotension: YES Has patient had a PCN reaction causing severe rash involving mucus membranes or skin necrosis: NO Has patient  had a PCN reaction that required hospitalization NO Has patient had a PCN reaction occurring within the last 10 years: NO If all of the above answers are "NO", then may proceed with Cephalosporin use.     REVIEW OF SYSTEMS:   Review of Systems  Constitutional:  Positive for fatigue. Negative for chills and fever.  HENT:   Negative for lump/mass, mouth sores, nosebleeds,  sore throat and trouble swallowing.   Eyes:  Negative for eye problems.  Respiratory:  Positive for shortness of breath. Negative for cough.   Cardiovascular:  Positive for palpitations. Negative for chest pain and leg swelling.  Gastrointestinal:  Positive for vomiting. Negative for abdominal pain, constipation, diarrhea and nausea.  Genitourinary:  Negative for bladder incontinence, difficulty urinating, dysuria, frequency, hematuria and nocturia.   Musculoskeletal:  Negative for arthralgias, back pain, flank pain, myalgias and neck pain.  Skin:  Negative for itching and rash.  Neurological:  Positive for numbness. Negative for dizziness and headaches.  Hematological:  Does not bruise/bleed easily.  Psychiatric/Behavioral:  Positive for depression. Negative for sleep disturbance and suicidal ideas. The patient is not nervous/anxious.   All other systems reviewed and are negative.    VITALS:   Blood pressure 107/66, pulse (!) 106, temperature 98.8 F (37.1 C), temperature source Oral, resp. rate 20, height 5\' 4"  (1.626 m), weight 107 lb 8 oz (48.8 kg), last menstrual period 07/16/2013, SpO2 100 %.  Wt Readings from Last 3 Encounters:  11/03/22 107 lb 8 oz (48.8 kg)  10/30/22 108 lb 12.8 oz (49.4 kg)  10/28/22 107 lb (48.5 kg)    Body mass index is 18.45 kg/m.  Performance status (ECOG): 1 - Symptomatic but completely ambulatory  PHYSICAL EXAM:   Physical Exam Vitals and nursing note reviewed. Exam conducted with a chaperone present.  Constitutional:      Appearance: Normal appearance.  Cardiovascular:     Rate and Rhythm: Normal rate and regular rhythm.     Pulses: Normal pulses.     Heart sounds: Normal heart sounds.  Pulmonary:     Effort: Pulmonary effort is normal.     Breath sounds: Normal breath sounds.  Abdominal:     Palpations: Abdomen is soft. There is no hepatomegaly, splenomegaly or mass.     Tenderness: There is no abdominal tenderness.  Musculoskeletal:      Right lower leg: No edema.     Left lower leg: No edema.  Lymphadenopathy:     Cervical: No cervical adenopathy.     Right cervical: No superficial, deep or posterior cervical adenopathy.    Left cervical: No superficial, deep or posterior cervical adenopathy.     Upper Body:     Right upper body: No supraclavicular or axillary adenopathy.     Left upper body: No supraclavicular or axillary adenopathy.  Neurological:     General: No focal deficit present.     Mental Status: She is alert and oriented to person, place, and time.  Psychiatric:        Mood and Affect: Mood normal.        Behavior: Behavior normal.     LABS:      Latest Ref Rng & Units 11/03/2022    9:06 AM 10/16/2022    5:22 AM 10/15/2022    3:40 AM  CBC  WBC 4.0 - 10.5 K/uL 8.7  18.4  38.0   Hemoglobin 12.0 - 15.0 g/dL 8.5  8.0  8.9   Hematocrit 36.0 - 46.0 %  27.7  27.3  29.7   Platelets 150 - 400 K/uL 314  278  320       Latest Ref Rng & Units 11/03/2022    9:06 AM 10/16/2022    8:54 AM 10/15/2022    3:40 AM  CMP  Glucose 70 - 99 mg/dL 161  78  98   BUN 6 - 20 mg/dL 11  9  11    Creatinine 0.44 - 1.00 mg/dL 0.96  0.45  4.09   Sodium 135 - 145 mmol/L 135  136  137   Potassium 3.5 - 5.1 mmol/L 2.5  3.9  3.0   Chloride 98 - 111 mmol/L 92  98  93   CO2 22 - 32 mmol/L 32  27  33   Calcium 8.9 - 10.3 mg/dL 8.3  8.0  8.6   Total Protein 6.5 - 8.1 g/dL 6.2   5.8   Total Bilirubin 0.3 - 1.2 mg/dL 0.6   0.7   Alkaline Phos 38 - 126 U/L 93   167   AST 15 - 41 U/L 22   19   ALT 0 - 44 U/L 15   14      No results found for: "CEA1", "CEA" / No results found for: "CEA1", "CEA" No results found for: "PSA1" No results found for: "WJX914" No results found for: "CAN125"  No results found for: "TOTALPROTELP", "ALBUMINELP", "A1GS", "A2GS", "BETS", "BETA2SER", "GAMS", "MSPIKE", "SPEI" No results found for: "TIBC", "FERRITIN", "IRONPCTSAT" Lab Results  Component Value Date   LDH 200 (H) 06/23/2022     STUDIES:   ECHO  TEE  Result Date: 10/15/2022    TRANSESOPHOGEAL ECHO REPORT   Patient Name:   FARREL SISEMORE Date of Exam: 10/15/2022 Medical Rec #:  [id number]    Height:       64.0 in Accession #:    [id number]   Weight:       111.3 lb Date of Birth:  March 12, 1961   BSA:          1.525 m Patient Age:    61 years     BP:           109/76 mmHg Patient Gender: F            HR:           115 bpm. [facility] Procedure: Transesophageal Echo, Cardiac Doppler and Color Doppler Indications:    Atrial fibrillation I48.91  History:        Patient has prior history of Echocardiogram examinations, most                 recent 05/21/2022. CHF, COPD, Arrythmias:Atrial Fibrillation;                 Risk Factors:Non-Smoker, Hypertension and Dyslipidemia. There is                 a 19mm On-X prosthetic valve in the Aortic position. Procedure                 date 07/25/14. Aortic root repair, Coarctation of the Aorto.  Sonographer:    Celesta Gentile RCS Referring Phys: [id number] Dorothe Pea BRANCH PROCEDURE: The transesophogeal probe was passed without difficulty through the esophogus of the patient. Sedation performed by different physician. Image quality was good. The patient developed no complications during the procedure.  IMPRESSIONS  1. Left ventricular ejection fraction, by estimation, is 55 to 60%. The left ventricle has  normal function.  2. Right ventricular systolic function is normal. The right ventricular size is normal.  3. Left atrial size was moderately dilated. No left atrial/left atrial appendage thrombus was detected. The LAA emptying velocity was 15 cm/s.  4. Right atrial size was moderately dilated.  5. The mitral valve is abnormal. Mild mitral valve regurgitation. No evidence of mitral stenosis.  6. The tricuspid valve is abnormal.  7. 19mm on X mechanical valve is in the AV position. . The aortic valve has been repaired/replaced. Aortic valve regurgitation is not visualized. No aortic stenosis is present. FINDINGS  Left  Ventricle: Left ventricular ejection fraction, by estimation, is 55 to 60%. The left ventricle has normal function. The left ventricular internal cavity size was normal in size. Right Ventricle: The right ventricular size is normal. Right vetricular wall thickness was not well visualized. Right ventricular systolic function is normal. Left Atrium: Left atrial size was moderately dilated. No left atrial/left atrial appendage thrombus was detected. The LAA emptying velocity was 15 cm/s. Right Atrium: Right atrial size was moderately dilated. Pericardium: There is no evidence of pericardial effusion. Mitral Valve: The mitral valve is abnormal. Mild mitral valve regurgitation. No evidence of mitral valve stenosis. Tricuspid Valve: The tricuspid valve is abnormal. Tricuspid valve regurgitation is mild . No evidence of tricuspid stenosis. Aortic Valve: 19mm on X mechanical valve is in the AV position. The aortic valve has been repaired/replaced. Aortic valve regurgitation is not visualized. No aortic stenosis is present. Pulmonic Valve: The pulmonic valve was not well visualized. Pulmonic valve regurgitation is not visualized. No evidence of pulmonic stenosis. Aorta: The aortic root is normal in size and structure. IAS/Shunts: No atrial level shunt detected by color flow Doppler.   AORTA Ao Root diam: 2.90 cm [name] MD Electronically signed by [name] MD Signature Date/Time: 10/15/2022/4:05:15 PM    Final    DG Chest Port 1 View  Result Date: 10/14/2022 CLINICAL DATA:  Atrial fibrillation. EXAM: PORTABLE CHEST 1 VIEW COMPARISON:  Chest/rib radiographs 09/11/2022. Chest CTA 09/12/2022. FINDINGS: The patient is rotated to the left with grossly unchanged cardiomediastinal silhouette. Sequelae of aortic valve replacement and left atrial appendage clipping are again identified. A right jugular Port-A-Cath remains in place with tip projecting over the high right atrium. Lung volumes are decreased with mild  interstitial prominence and mild atelectasis in the left lung base. No overt pulmonary edema, segmental airspace consolidation, sizeable pleural effusion, or pneumothorax is identified. No acute osseous abnormality is seen. IMPRESSION: Low lung volumes without evidence of acute airspace disease. Electronically Signed   By: Sebastian Ache M.D.   On: 10/14/2022 18:37

## 2022-11-03 NOTE — Patient Instructions (Addendum)
Increase warfarin to 3 tablets daily except 4 tablets on Tuesdays Next chemo 7/2 & 7/3 Recheck INR in 1 wk Pending kyphoplasty:  Date TBD  Will hold warfarin 5 days before procedure and bridge with lovenox 40mg  twice daily with last dose am of day before surgery

## 2022-11-03 NOTE — Progress Notes (Signed)
Patient has been examined by Dr. Ellin Saba. Vital signs (HR 106) and labs have been reviewed by MD - ANC, Creatinine, LFTs, hemoglobin, and platelets are within treatment parameters per M.D. - pt may proceed with treatment. Replete K+ per standing orders Primary RN and pharmacy notified.

## 2022-11-03 NOTE — Patient Instructions (Signed)
[facility] at [facility] Discharge Instructions   You were seen and examined today by Dr. Ellin Saba.  He reviewed the results of your lab work which are mostly normal/stable. Your potassium is critically low at 2.5. We will give you IV potassium and potassium pills in the clinic today.   We will proceed with your treatment today.   Return as scheduled.    Thank you for choosing [facility] at [facility] to provide your oncology and hematology care.  To afford each patient quality time with our provider, please arrive at least 15 minutes before your scheduled appointment time.   If you have a lab appointment with the Cancer Center please come in thru the Main Entrance and check in at the main information desk.  You need to re-schedule your appointment should you arrive 10 or more minutes late.  We strive to give you quality time with our providers, and arriving late affects you and other patients whose appointments are after yours.  Also, if you no show three or more times for appointments you may be dismissed from the clinic at the providers discretion.     Again, thank you for choosing [facility].  Our hope is that these requests will decrease the amount of time that you wait before being seen by our physicians.       _____________________________________________________________  Should you have questions after your visit to [facility], please contact our office at [phone] and follow the prompts.  Our office hours are 8:00 a.m. and 4:30 p.m. Monday - Friday.  Please note that voicemails left after 4:00 p.m. may not be returned until the following business day.  We are closed weekends and major holidays.  You do have access to a nurse 24-7, just call the main number to the clinic [phone] and do not press any options, hold on the line and a nurse will answer the phone.    For prescription refill requests,  have your pharmacy contact our office and allow 72 hours.    Due to Covid, you will need to wear a mask upon entering the hospital. If you do not have a mask, a mask will be given to you at the Main Entrance upon arrival. For doctor visits, patients may have 1 support person age 62 or older with them. For treatment visits, patients can not have anyone with them due to social distancing guidelines and our immunocompromised population.

## 2022-11-04 ENCOUNTER — Inpatient Hospital Stay: Payer: Medicaid Other

## 2022-11-04 ENCOUNTER — Encounter: Payer: Self-pay | Admitting: Internal Medicine

## 2022-11-04 ENCOUNTER — Ambulatory Visit: Payer: Medicaid Other | Attending: Internal Medicine | Admitting: Internal Medicine

## 2022-11-04 VITALS — BP 128/88 | HR 132 | Ht 64.0 in | Wt 113.8 lb

## 2022-11-04 VITALS — BP 103/69 | HR 110 | Temp 97.0°F | Resp 18

## 2022-11-04 DIAGNOSIS — I4892 Unspecified atrial flutter: Secondary | ICD-10-CM | POA: Diagnosis not present

## 2022-11-04 DIAGNOSIS — Z5112 Encounter for antineoplastic immunotherapy: Secondary | ICD-10-CM | POA: Diagnosis not present

## 2022-11-04 DIAGNOSIS — C8208 Follicular lymphoma grade I, lymph nodes of multiple sites: Secondary | ICD-10-CM

## 2022-11-04 DIAGNOSIS — Z95828 Presence of other vascular implants and grafts: Secondary | ICD-10-CM

## 2022-11-04 MED ORDER — SODIUM CHLORIDE 0.9 % IV SOLN
60.0000 mg/m2 | Freq: Once | INTRAVENOUS | Status: AC
  Administered 2022-11-04: 87.5 mg via INTRAVENOUS
  Filled 2022-11-04: qty 3.5

## 2022-11-04 MED ORDER — SODIUM CHLORIDE 0.9% FLUSH
10.0000 mL | INTRAVENOUS | Status: DC | PRN
  Administered 2022-11-04: 10 mL

## 2022-11-04 MED ORDER — SODIUM CHLORIDE 0.9 % IV SOLN
10.0000 mg | Freq: Once | INTRAVENOUS | Status: AC
  Administered 2022-11-04: 10 mg via INTRAVENOUS
  Filled 2022-11-04: qty 10

## 2022-11-04 MED ORDER — SODIUM CHLORIDE 0.9 % IV SOLN: Freq: Once | INTRAVENOUS | Status: AC

## 2022-11-04 MED ORDER — HEPARIN SOD (PORK) LOCK FLUSH 100 UNIT/ML IV SOLN
500.0000 [IU] | Freq: Once | INTRAVENOUS | Status: AC | PRN
  Administered 2022-11-04: 500 [IU]

## 2022-11-04 NOTE — Progress Notes (Signed)
HPI Mallory Sutton returns today for followup. She is a pleasant 62 yo woman with multiple cardiac and medical problems including lymphoma now undergoing chemotherapy, with a Port a cath, longstanding atrial fib and flutter with a RVR. She has pulmonary hypertension and adult congenital heart disease, h/o bicuspid aortic valve, s/p replacement, and aortic arch repair. She had maintained NSR on propafenone which became ineffective, and has been on dofetilide. She maintained NSR on this medication but with initiation of chemotherapy has had more atrial fib. She appears to be tolerating this fairly well. She notes that she has either 1 or 3 more rounds of chemotherapy.  Allergies  Allergen Reactions   Amiodarone Nausea Only   Penicillins Hives    Has patient had a PCN reaction causing immediate rash, facial/tongue/throat swelling, SOB or lightheadedness with hypotension: YES Has patient had a PCN reaction causing severe rash involving mucus membranes or skin necrosis: NO Has patient had a PCN reaction that required hospitalization NO Has patient had a PCN reaction occurring within the last 10 years: NO If all of the above answers are "NO", then may proceed with Cephalosporin use.      Current Outpatient Medications  Medication Sig Dispense Refill   acetaminophen (TYLENOL) 325 MG tablet Take 2 tablets (650 mg total) by mouth every 6 (six) hours as needed for mild pain (or Fever >/= 101). 100 tablet 1   albuterol (VENTOLIN HFA) 108 (90 Base) MCG/ACT inhaler Inhale 2 puffs into the lungs every 4 (four) hours as needed for wheezing or shortness of breath. 54 g 6   allopurinol (ZYLOPRIM) 300 MG tablet Take 1 tablet (300 mg total) by mouth daily. 30 tablet 3   ALPRAZolam (XANAX) 0.5 MG tablet Take 0.5 mg by mouth 3 (three) times daily.     aspirin EC 81 MG tablet Take 1 tablet (81 mg total) by mouth daily with breakfast. 30 tablet 11   BENDAMUSTINE HCL IV Inject into the vein every 28  (twenty-eight) days. Days 1&2 every 28 days     Budeson-Glycopyrrol-Formoterol (BREZTRI AEROSPHERE) 160-9-4.8 MCG/ACT AERO Inhale 2 puffs into the lungs 2 (two) times daily. 10.7 g 1   calcium-vitamin D (OSCAL WITH D) 500-5 MG-MCG tablet Take 1 tablet by mouth daily. 30 tablet 2   dofetilide (TIKOSYN) 250 MCG capsule Take 1 capsule (250 mcg total) by mouth 2 (two) times daily. 60 capsule 6   feeding supplement (ENSURE ENLIVE / ENSURE PLUS) LIQD Take 237 mLs by mouth 2 (two) times daily between meals.     furosemide (LASIX) 40 MG tablet Take 2 tablets (80 mg total) by mouth daily. 30 tablet 3   gabapentin (NEURONTIN) 300 MG capsule Take 300-600 mg by mouth See admin instructions. Take 300 mg tablet by mouth in the morning, 300 mg tablet by mouth in the afternoon, and then take two of the 300 mg tablets by mouth for total of 600 mg in the evening per patient     ipratropium-albuterol (DUONEB) 0.5-2.5 (3) MG/3ML SOLN Inhale 3 mLs into the lungs 2 (two) times daily as needed (shortness of breath). 360 mL 1   lidocaine-prilocaine (EMLA) cream Apply a small amount to port a cath site and cover with plastic wrap 1 hour prior to infusion appointments 30 g 3   magnesium oxide (MAGOX 400) 400 (240 Mg) MG tablet Take 1 tablet (400 mg total) by mouth daily. 30 tablet 2   metoprolol succinate (TOPROL-XL) 100 MG 24 hr tablet Take  1.5 tablets (150 mg total) by mouth daily. Take with or immediately following a meal. 135 tablet 3   Multiple Vitamin (MULTI-VITAMINS) TABS Take 1 tablet by mouth daily.      oxyCODONE-acetaminophen (PERCOCET) 10-325 MG tablet TAKE 1 TABLET BY MOUTH EVERY 6 HOURS AS NEEDED FOR PAIN 120 tablet 0   pantoprazole (PROTONIX) 40 MG tablet Take 1 tablet (40 mg total) by mouth daily. 30 tablet 2   potassium chloride SA (KLOR-CON M) 20 MEQ tablet Take 2 tablets (40 mEq total) by mouth 3 (three) times daily. 180 tablet 3   predniSONE (DELTASONE) 20 MG tablet Take 1 tablet (20 mg total) by mouth  daily with breakfast. 90 tablet 3   riTUXimab (RITUXAN IV) Inject into the vein every 28 (twenty-eight) days.     scopolamine (TRANSDERM-SCOP) 1 MG/3DAYS Place 1 patch (1.5 mg total) onto the skin every 3 (three) days. 10 patch 0   venlafaxine (EFFEXOR) 75 MG tablet Take 75 mg by mouth 2 (two) times daily.     warfarin (COUMADIN) 1 MG tablet Take warfarin 4 to 5 tablets daily or as directed by coumadin clinic (Patient taking differently: Take 1 mg by mouth as directed. Take warfarin 4 to 5 tablets daily or as directed by coumadin clinic) 135 tablet 5   zolpidem (AMBIEN) 10 MG tablet Take 1 tablet (10 mg total) by mouth at bedtime as needed for sleep. 10 tablet 0   No current facility-administered medications for this visit.     Past Medical History:  Diagnosis Date   Aortic stenosis    ATRIAL ARRHYTHMIAS    Bicuspid aortic valve    CHF (congestive heart failure) (HCC)    COARCTATION OF AORTA    Dysrhythmia    ENDOMETRIOSIS    Heart murmur    Lymphoma (HCC)    Paroxysmal atrial fibrillation (HCC)    Port-A-Cath in place 08/31/2022   Shortness of breath    THORACIC AORTIC ANEURYSM    Vasculitis (HCC)     ROS:   All systems reviewed and negative except as noted in the HPI.   Past Surgical History:  Procedure Laterality Date   CARDIOVERSION N/A 08/29/2019   Procedure: CARDIOVERSION;  Surgeon: Antoine Poche, MD;  Location: [facility];  Service: Endoscopy;  Laterality: N/A;   CARDIOVERSION N/A 07/16/2022   Procedure: CARDIOVERSION;  Surgeon: Sande Rives, MD;  Location[facility] ENDOSCOPY;  Service: Cardiovascular;  Laterality: N/A;   CARDIOVERSION N/A 10/15/2022   Procedure: CARDIOVERSION;  Surgeon: Antoine Poche, MD;  Location: [facility];  Service: Endoscopy;  Laterality: N/A;   coarctation repair and residual restenosis     IR BONE MARROW BIOPSY & ASPIRATION  08/10/2022   IR IMAGING GUIDED PORT INSERTION  08/10/2022   KNEE SURGERY     LYMPH NODE BIOPSY Right 06/25/2022    Procedure: LYMPH NODE BIOPSY; right groin;  Surgeon: Lucretia Roers, MD;  Location: [facility];  Service: General;  Laterality: Right;   PORTA CATH INSERTION     TEE WITHOUT CARDIOVERSION N/A 07/12/2019   Procedure: TRANSESOPHAGEAL ECHOCARDIOGRAM (TEE);  Surgeon: Quintella Reichert, MD;  Location: [facility] ENDOSCOPY;  Service: Cardiovascular;  Laterality: N/A;   TEE WITHOUT CARDIOVERSION N/A 08/29/2019   Procedure: TRANSESOPHAGEAL ECHOCARDIOGRAM (TEE) WITH PROPOFOL;  Surgeon: Antoine Poche, MD;  Location: [facility];  Service: Endoscopy;  Laterality: N/A;   TEE WITHOUT CARDIOVERSION N/A 10/15/2022   Procedure: TRANSESOPHAGEAL ECHOCARDIOGRAM (TEE);  Surgeon: Antoine Poche, MD;  Location: [facility];  Service: Endoscopy;  Laterality: N/A;     Family History  Problem Relation Age of Onset   Suicidality Mother    Mental illness Mother    Drug abuse Brother    Healthy Daughter    Healthy Son      Social History   Socioeconomic History   Marital status: Married    Spouse name: Not on file   Number of children: Not on file   Years of education: Not on file   Highest education level: Not on file  Occupational History   Not on file  Tobacco Use   Smoking status: Never    Passive exposure: Past   Smokeless tobacco: Never   Tobacco comments:    Never smoke 07/14/22  Vaping Use   Vaping Use: Never used  Substance and Sexual Activity   Alcohol use: Yes    Comment: 3 beers 3 nights a week 07/14/22   Drug use: No   Sexual activity: Never  Other Topics Concern   Not on file  Social History Narrative   Not on file   Social Determinants of Health   Financial Resource Strain: High Risk (07/02/2022)   Overall Financial Resource Strain (CARDIA)    Difficulty of Paying Living Expenses: Very hard  Food Insecurity: No Food Insecurity (10/15/2022)   Hunger Vital Sign    Worried About Running Out of Food in the Last Year: Never true    Ran Out of Food in the Last Year: Never true   Transportation Needs: No Transportation Needs (10/15/2022)   PRAPARE - Administrator, Civil Service (Medical): No    Lack of Transportation (Non-Medical): No  Physical Activity: Not on file  Stress: Not on file  Social Connections: Not on file  Intimate Partner Violence: Not At Risk (10/15/2022)   Humiliation, Afraid, Rape, and Kick questionnaire    Fear of Current or Ex-Partner: No    Emotionally Abused: No    Physically Abused: No    Sexually Abused: No     BP 128/88 (BP Location: Left Arm, Patient Position: Sitting, Cuff Size: Normal)   Pulse (!) 132   Ht 5\' 4"  (1.626 m)   Wt 113 lb 12.8 oz (51.6 kg)   LMP 07/16/2013   SpO2 97%   BMI 19.53 kg/m   Physical Exam:  Well appearing NAD HEENT: Unremarkable Neck:  7 cm JVD, no thyromegally Lymphatics:  No adenopathy Back:  No CVA tenderness Lungs:  scattered rales with no wheezes HEART:  IRegular tachy rhythm, 2/6 systolic murmurs, no rubs, no clicks Abd:  soft, positive bowel sounds, no organomegally, no rebound, no guarding Ext:  2 plus pulses, no edema, no cyanosis, no clubbing Skin:  No rashes no nodules Neuro:  CN II through XII intact, motor grossly intact  EKG - atrial fib with a RVR   Assess/Plan: Atrial fib - hopefully when her chemo is over she will return mainly to NSR. If not amio would be the only medication to maintain NSR. AV node ablation and PPM would be a consideration but she is not a candidate presently with an indwelling port o cath. I'll see her back in a couple of months. She was not felt to be a candidate for afib ablation.  Mallory Sutton Otilia Kareem,MD

## 2022-11-04 NOTE — Patient Instructions (Signed)
[facility]  Discharge Instructions: Thank you for choosing  Cancer Center to provide your oncology and hematology care.  If you have a lab appointment with the Cancer Center - please note that after April 8th, 2024, all labs will be drawn in the cancer center.  You do not have to check in or register with the main entrance as you have in the past but will complete your check-in in the cancer center.  Wear comfortable clothing and clothing appropriate for easy access to any Portacath or PICC line.   We strive to give you quality time with your provider. You may need to reschedule your appointment if you arrive late (15 or more minutes).  Arriving late affects you and other patients whose appointments are after yours.  Also, if you miss three or more appointments without notifying the office, you may be dismissed from the clinic at the provider's discretion.      For prescription refill requests, have your pharmacy contact our office and allow 72 hours for refills to be completed.    Today you received the following chemotherapy and/or immunotherapy agents Bendka.  Bendamustine Injection What is this medication? BENDAMUSTINE (BEN da MUS teen) treats leukemia and lymphoma. It works by slowing down the growth of cancer cells. This medicine may be used for other purposes; ask your health care provider or pharmacist if you have questions. COMMON BRAND NAME(S): Marilynne Halsted, VIVIMUSTA What should I tell my care team before I take this medication? They need to know if you have any of these conditions: Infection, especially a viral infection, such as chickenpox, cold sores, herpes Kidney disease Liver disease An unusual or allergic reaction to bendamustine, mannitol, other medications, foods, dyes, or preservatives Pregnant or trying to get pregnant Breast-feeding How should I use this medication? This medication is injected into a vein. It is given by  your care team in a hospital or clinic setting. Talk to your care team about the use of this medication in children. Special care may be needed. Overdosage: If you think you have taken too much of this medicine contact a poison control center or emergency room at once. NOTE: This medicine is only for you. Do not share this medicine with others. What if I miss a dose? Keep appointments for follow-up doses. It is important not to miss your dose. Call your care team if you are unable to keep an appointment. What may interact with this medication? Do not take this medication with any of the following: Clozapine This medication may also interact with the following: Atazanavir Cimetidine Ciprofloxacin Enoxacin Fluvoxamine Medications for seizures, such as carbamazepine, phenobarbital Mexiletine Rifampin Tacrine Thiabendazole Zileuton This list may not describe all possible interactions. Give your health care provider a list of all the medicines, herbs, non-prescription drugs, or dietary supplements you use. Also tell them if you smoke, drink alcohol, or use illegal drugs. Some items may interact with your medicine. What should I watch for while using this medication? Visit your care team for regular checks on your progress. This medication may make you feel generally unwell. This is not uncommon, as chemotherapy can affect healthy cells as well as cancer cells. Report any side effects. Continue your course of treatment even though you feel ill unless your care team tells you to stop. You may need blood work while taking this medication. This medication may increase your risk of getting an infection. Call your care team for advice if you get  a fever, chills, sore throat, or other symptoms of a cold or flu. Do not treat yourself. Try to avoid being around people who are sick. This medication may cause serious skin reactions. They can happen weeks to months after starting the medication. Contact  your care team right away if you notice fevers or flu-like symptoms with a rash. The rash may be red or purple and then turn into blisters or peeling of the skin. You may also notice a red rash with swelling of the face, lips, or lymph nodes in your neck or under your arms. In some patients, this medication may cause a serious brain infection that may cause death. If you have any problems seeing, thinking, speaking, walking, or standing, tell your care team right away. If you cannot reach your care team, urgently seek other source of medical care. This medication may increase your risk to bruise or bleed. Call your care team if you notice any unusual bleeding. Talk to your care team about your risk of cancer. You may be more at risk for certain types of cancer if you take this medication. Talk to your care team about your risk of skin cancer. You may be more at risk for skin cancer if you take this medication. Talk to your care team if you or your partner wish to become pregnant or think either of you might be pregnant. This medication can cause serious birth defects if taken during pregnancy or for up to 6 months after the last dose. A negative pregnancy test is required before starting this medication. A reliable form of contraception is recommended while taking this medication and for 6 months after the last dose. Talk to your care team about reliable forms of contraception. Wear a condom while taking this medication and for at least 3 months after the last dose. Do not breast-feed while taking this medication or for at least 1 week after the last dose. This medication may cause infertility. Talk to your care team if you are concerned about your fertility. What side effects may I notice from receiving this medication? Side effects that you should report to your care team as soon as possible: Allergic reactions--skin rash, itching, hives, swelling of the face, lips, tongue, or throat Infection--fever,  chills, cough, sore throat, wounds that don't heal, pain or trouble when passing urine, general feeling of discomfort or being unwell Infusion reactions--chest pain, shortness of breath or trouble breathing, feeling faint or lightheaded Liver injury--right upper belly pain, loss of appetite, nausea, light-colored stool, dark yellow or brown urine, yellowing skin or eyes, unusual weakness or fatigue Low red blood cell level--unusual weakness or fatigue, dizziness, headache, trouble breathing Painful swelling, warmth, or redness of the skin, blisters or sores at the infusion site Rash, fever, and swollen lymph nodes Redness, blistering, peeling, or loosening of the skin, including inside the mouth Tumor lysis syndrome (TLS)--nausea, vomiting, diarrhea, decrease in the amount of urine, dark urine, unusual weakness or fatigue, confusion, muscle pain or cramps, fast or irregular heartbeat, joint pain Unusual bruising or bleeding Side effects that usually do not require medical attention (report to your care team if they continue or are bothersome): Diarrhea Fatigue Headache Loss of appetite Nausea Vomiting This list may not describe all possible side effects. Call your doctor for medical advice about side effects. You may report side effects to FDA at 1-800-FDA-1088. Where should I keep my medication? This medication is given in a hospital or clinic. It will not be stored   at home. NOTE: This sheet is a summary. It may not cover all possible information. If you have questions about this medicine, talk to your doctor, pharmacist, or health care provider.  2024 Elsevier/Gold Standard (2021-08-12 00:00:00)       To help prevent nausea and vomiting after your treatment, we encourage you to take your nausea medication as directed.  BELOW ARE SYMPTOMS THAT SHOULD BE REPORTED IMMEDIATELY: *FEVER GREATER THAN 100.4 F (38 C) OR HIGHER *CHILLS OR SWEATING *NAUSEA AND VOMITING THAT IS NOT CONTROLLED  WITH YOUR NAUSEA MEDICATION *UNUSUAL SHORTNESS OF BREATH *UNUSUAL BRUISING OR BLEEDING *URINARY PROBLEMS (pain or burning when urinating, or frequent urination) *BOWEL PROBLEMS (unusual diarrhea, constipation, pain near the anus) TENDERNESS IN MOUTH AND THROAT WITH OR WITHOUT PRESENCE OF ULCERS (sore throat, sores in mouth, or a toothache) UNUSUAL RASH, SWELLING OR PAIN  UNUSUAL VAGINAL DISCHARGE OR ITCHING   Items with * indicate a potential emergency and should be followed up as soon as possible or go to the Emergency Department if any problems should occur.  Please show the CHEMOTHERAPY ALERT CARD or IMMUNOTHERAPY ALERT CARD at check-in to the Emergency Department and triage nurse.  Should you have questions after your visit or need to cancel or reschedule your appointment, please contact [facility] [phone]  and follow the prompts.  Office hours are 8:00 a.m. to 4:30 p.m. Monday - Friday. Please note that voicemails left after 4:00 p.m. may not be returned until the following business day.  We are closed weekends and major holidays. You have access to a nurse at all times for urgent questions. Please call the main number to the clinic [phone] and follow the prompts.  For any non-urgent questions, you may also contact your provider using MyChart. We now offer e-Visits for anyone 80 and older to request care online for non-urgent symptoms. For details visit [email or web address].   Also download the MyChart app! Go to the app store, search "MyChart", open the app, select [place], and log in with your MyChart username and password.

## 2022-11-04 NOTE — Progress Notes (Signed)
Patient presents today for [facility] chemotherapy infusion.  Patient is in satisfactory condition with no new complaints voiced.  Vital signs are stable.  Labs reviewed and all labs are within treatment parameters.  We will proceed with treatment per MD orders.    Patient tolerated treatment well with no complaints voiced.  Patient left via wheelchair, with family, stable condition.  Vital signs stable at discharge.  Follow up as scheduled.

## 2022-11-04 NOTE — Patient Instructions (Addendum)
Medication Instructions:  Continue all current medications.  Labwork: none  Testing/Procedures: none  Follow-Up: 4 months   Any Other Special Instructions Will Be Listed Below (If Applicable).  If you need a refill on your cardiac medications before your next appointment, please call your pharmacy.\ 

## 2022-11-05 ENCOUNTER — Other Ambulatory Visit: Payer: Self-pay

## 2022-11-05 ENCOUNTER — Encounter: Payer: Self-pay | Admitting: Emergency Medicine

## 2022-11-05 ENCOUNTER — Ambulatory Visit
Admission: EM | Admit: 2022-11-05 | Discharge: 2022-11-05 | Disposition: A | Discharge status: Home / Self Care | Payer: Medicaid Other | Attending: Family Medicine | Admitting: Family Medicine

## 2022-11-05 DIAGNOSIS — I4891 Unspecified atrial fibrillation: Secondary | ICD-10-CM | POA: Diagnosis not present

## 2022-11-05 DIAGNOSIS — H0289 Other specified disorders of eyelid: Secondary | ICD-10-CM

## 2022-11-05 MED ORDER — ERYTHROMYCIN 5 MG/GM OP OINT: TOPICAL_OINTMENT | OPHTHALMIC | 0 refills | Status: DC

## 2022-11-05 MED ORDER — OLOPATADINE HCL 0.1 % OP SOLN: 1.0000 [drp] | Freq: Two times a day (BID) | OPHTHALMIC | 1 refills | Status: DC

## 2022-11-05 NOTE — ED Triage Notes (Signed)
Pt reports left eye problem for last several weeks. Pt reports "feels like something is in it." Pt reports swelling, drainage since this am. Denies any known injury.   Pt reports wears contact lens in right eye at baseline. Reports blurred vision in left eye today.

## 2022-11-05 NOTE — Discharge Instructions (Signed)
You can purchase allergy eye drops OTC if unable to pick up your prescription tonight. It appears they go by all sorts of brand names but look for it to say antihistamine eye drops on the package

## 2022-11-05 NOTE — ED Provider Notes (Signed)
[facility]    CSN: [id number] Arrival date & time: 11/05/22  1536      History   Chief Complaint Chief Complaint  Patient presents with   Eye Problem    HPI Mallory Sutton is a 62 y.o. female.   Pt reports left eye problem for last several weeks. Pt reports "feels like something is in it." Pt reports swelling, drainage since this am. Denies any known injury.    Pt reports wears contact lens in right eye at baseline. Reports blurred vision in left eye today.       Past Medical History:  Diagnosis Date   Aortic stenosis    ATRIAL ARRHYTHMIAS    Bicuspid aortic valve    CHF (congestive heart failure) (HCC)    COARCTATION OF AORTA    Dysrhythmia    ENDOMETRIOSIS    Heart murmur    Lymphoma (HCC)    Paroxysmal atrial fibrillation (HCC)    Port-A-Cath in place 08/31/2022   Shortness of breath    THORACIC AORTIC ANEURYSM    Vasculitis (HCC)     Patient Active Problem List   Diagnosis Date Noted   Port-A-Cath in place 08/31/2022   Hypercoagulable state due to persistent atrial fibrillation (HCC) 07/14/2022   Persistent atrial fibrillation (HCC) 07/14/2022   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   Adenopathy 06/24/2022   Generalized lymphadenopathy 06/23/2022   Chronic respiratory failure with hypoxia (HCC) 06/23/2022   Hearing loss 06/03/2022   Numbness    Hypocalcemia    GERD (gastroesophageal reflux disease) 11/09/2021   Leukocytosis 11/09/2021   Protein-calorie malnutrition, severe (HCC) 07/19/2021   Acute respiratory failure with hypoxia (HCC) 07/17/2021   Hypokalemia 07/17/2021   Anxiety and depression 07/17/2021   Chronic diastolic CHF (congestive heart failure) (HCC) 07/17/2021   COPD (chronic obstructive pulmonary disease) (HCC) 07/16/2021   Vasculitis (HCC) 12/31/2020   Bilateral hand pain 12/31/2020   Benign skin lesion of thigh 10/15/2020   Atrial flutter (HCC)    Acute diastolic heart failure (HCC) 08/23/2014   Pleural  effusion 08/23/2014   Pleural effusion, bilateral 08/23/2014   Dyspnea    Acute respiratory failure (HCC) 08/22/2014   Atrial fibrillation with RVR (HCC) 08/06/2014   Encounter for therapeutic drug monitoring 08/06/2014   S/P aortic valve replacement-07/25/14 at [facility] 08/06/2014   Shortness of breath 04/23/2010   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 07/25/14 [facility] 03/12/2010   Aortic valve disorder 09/12/2009   ENDOMETRIOSIS 09/12/2009   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 09/12/2009   Paroxysmal atrial fibrillation (HCC) 07/18/2008   BICUSPID AORTIC VALVE 07/18/2008    Past Surgical History:  Procedure Laterality Date   CARDIOVERSION N/A 08/29/2019   Procedure: CARDIOVERSION;  Surgeon: Antoine Poche, MD;  Location: [facility];  Service: Endoscopy;  Laterality: N/A;   CARDIOVERSION N/A 07/16/2022   Procedure: CARDIOVERSION;  Surgeon: Sande Rives, MD;  Location: [facility] ENDOSCOPY;  Service: Cardiovascular;  Laterality: N/A;   CARDIOVERSION N/A 10/15/2022   Procedure: CARDIOVERSION;  Surgeon: Antoine Poche, MD;  Location: [facility];  Service: Endoscopy;  Laterality: N/A;   coarctation repair and residual restenosis     IR BONE MARROW BIOPSY & ASPIRATION  08/10/2022   IR IMAGING GUIDED PORT INSERTION  08/10/2022   KNEE SURGERY     LYMPH NODE BIOPSY Right 06/25/2022   Procedure: LYMPH NODE BIOPSY; right groin;  Surgeon: Lucretia Roers, MD;  Location: [facility];  Service:  General;  Laterality: Right;   PORTA CATH INSERTION     TEE WITHOUT CARDIOVERSION N/A 07/12/2019   Procedure: TRANSESOPHAGEAL ECHOCARDIOGRAM (TEE);  Surgeon: Quintella Reichert, MD;  Location: [facility] ENDOSCOPY;  Service: Cardiovascular;  Laterality: N/A;   TEE WITHOUT CARDIOVERSION N/A 08/29/2019   Procedure: TRANSESOPHAGEAL ECHOCARDIOGRAM (TEE) WITH PROPOFOL;  Surgeon: Antoine Poche, MD;  Location: [facility];  Service: Endoscopy;  Laterality: N/A;   TEE WITHOUT CARDIOVERSION  N/A 10/15/2022   Procedure: TRANSESOPHAGEAL ECHOCARDIOGRAM (TEE);  Surgeon: Antoine Poche, MD;  Location: [facility];  Service: Endoscopy;  Laterality: N/A;    OB History   No obstetric history on file.      Home Medications    Prior to Admission medications   Medication Sig Start Date End Date Taking? Authorizing Provider  erythromycin ophthalmic ointment Place a 1/2 inch ribbon of ointment into the left lower eyelid BID prn. 11/05/22  Yes Particia Nearing, PA-C  olopatadine (PATADAY) 0.1 % ophthalmic solution Place 1 drop into the left eye 2 (two) times daily. 11/05/22  Yes Particia Nearing, PA-C  acetaminophen (TYLENOL) 325 MG tablet Take 2 tablets (650 mg total) by mouth every 6 (six) hours as needed for mild pain (or Fever >/= 101). 06/27/22   Emokpae, Courage, MD  albuterol (VENTOLIN HFA) 108 (90 Base) MCG/ACT inhaler Inhale 2 puffs into the lungs every 4 (four) hours as needed for wheezing or shortness of breath. 06/27/22   Shon Hale, MD  allopurinol (ZYLOPRIM) 300 MG tablet Take 1 tablet (300 mg total) by mouth daily. 08/31/22   Doreatha Massed, MD  ALPRAZolam Prudy Feeler) 0.5 MG tablet Take 0.5 mg by mouth 3 (three) times daily. 07/01/22   [provider]  aspirin EC 81 MG tablet Take 1 tablet (81 mg total) by mouth daily with breakfast. 06/27/22   Mariea Clonts, Courage, MD  BENDAMUSTINE HCL IV Inject into the vein every 28 (twenty-eight) days. Days 1&2 every 28 days 09/08/22   [provider]  Budeson-Glycopyrrol-Formoterol (BREZTRI AEROSPHERE) 160-9-4.8 MCG/ACT AERO Inhale 2 puffs into the lungs 2 (two) times daily. 06/27/22   Shon Hale, MD  calcium-vitamin D (OSCAL WITH D) 500-5 MG-MCG tablet Take 1 tablet by mouth daily. 11/10/21   Vassie Loll, MD  dofetilide (TIKOSYN) 250 MCG capsule Take 1 capsule (250 mcg total) by mouth 2 (two) times daily. 08/12/22   Graciella Freer, PA-C  feeding supplement (ENSURE ENLIVE / ENSURE PLUS) LIQD Take 237 mLs  by mouth 2 (two) times daily between meals. 11/10/21   Vassie Loll, MD  furosemide (LASIX) 40 MG tablet Take 2 tablets (80 mg total) by mouth daily. 06/27/22   Shon Hale, MD  gabapentin (NEURONTIN) 300 MG capsule Take 300-600 mg by mouth See admin instructions. Take 300 mg tablet by mouth in the morning, 300 mg tablet by mouth in the afternoon, and then take two of the 300 mg tablets by mouth for total of 600 mg in the evening per patient    [provider]  ipratropium-albuterol (DUONEB) 0.5-2.5 (3) MG/3ML SOLN Inhale 3 mLs into the lungs 2 (two) times daily as needed (shortness of breath). 06/27/22   Shon Hale, MD  lidocaine-prilocaine (EMLA) cream Apply a small amount to port a cath site and cover with plastic wrap 1 hour prior to infusion appointments 08/31/22   Doreatha Massed, MD  magnesium oxide (MAGOX 400) 400 (240 Mg) MG tablet Take 1 tablet (400 mg total) by mouth daily. 02/06/22   Croitoru, Rachelle Hora, MD  metoprolol succinate (TOPROL-XL) 100 MG 24 hr tablet Take 1.5 tablets (150 mg total) by mouth daily. Take with or immediately following a meal. 10/28/22 01/26/23  Marinus Maw, MD  Multiple Vitamin (MULTI-VITAMINS) TABS Take 1 tablet by mouth daily.     [provider]  oxyCODONE-acetaminophen (PERCOCET) 10-325 MG tablet TAKE 1 TABLET BY MOUTH EVERY 6 HOURS AS NEEDED FOR PAIN 10/15/22   Rojelio Brenner M, PA-C  pantoprazole (PROTONIX) 40 MG tablet Take 1 tablet (40 mg total) by mouth daily. 10/09/22   Doreatha Massed, MD  potassium chloride SA (KLOR-CON M) 20 MEQ tablet Take 2 tablets (40 mEq total) by mouth 3 (three) times daily. 09/24/22   Carnella Guadalajara, PA-C  predniSONE (DELTASONE) 20 MG tablet Take 1 tablet (20 mg total) by mouth daily with breakfast. 04/29/22   Wendall Stade, MD  riTUXimab (RITUXAN IV) Inject into the vein every 28 (twenty-eight) days. 09/08/22   [provider]  scopolamine (TRANSDERM-SCOP) 1 MG/3DAYS Place 1 patch  (1.5 mg total) onto the skin every 3 (three) days. 10/06/22   Doreatha Massed, MD  venlafaxine (EFFEXOR) 75 MG tablet Take 75 mg by mouth 2 (two) times daily.    [provider]  warfarin (COUMADIN) 1 MG tablet Take warfarin 4 to 5 tablets daily or as directed by coumadin clinic Patient taking differently: Take 1 mg by mouth as directed. Take warfarin 4 to 5 tablets daily or as directed by coumadin clinic 09/23/22   Wendall Stade, MD  zolpidem (AMBIEN) 10 MG tablet Take 1 tablet (10 mg total) by mouth at bedtime as needed for sleep. 06/27/22 10/15/22  Shon Hale, MD    Family History Family History  Problem Relation Age of Onset   Suicidality Mother    Mental illness Mother    Drug abuse Brother    Healthy Daughter    Healthy Son     Social History Social History   Tobacco Use   Smoking status: Never    Passive exposure: Past   Smokeless tobacco: Never   Tobacco comments:    Never smoke 07/14/22  Vaping Use   Vaping Use: Never used  Substance Use Topics   Alcohol use: Yes    Comment: 3 beers 3 nights a week 07/14/22   Drug use: No     Allergies   Amiodarone and Penicillins   Review of Systems Review of Systems Per HPI  Physical Exam Triage Vital Signs ED Triage Vitals  Enc Vitals Group     BP 11/05/22 1702 (!) 144/99     Pulse Rate 11/05/22 1702 (!) 151     Resp 11/05/22 1702 18     Temp 11/05/22 1702 (!) 97.3 F (36.3 C)     Temp Source 11/05/22 1702 Oral     SpO2 11/05/22 1702 98 %     Weight --      Height --      Head Circumference --      Peak Flow --      Pain Score 11/05/22 1701 8     Pain Loc --      Pain Edu? --      Excl. in GC? --    No data found.  Updated Vital Signs BP (!) 144/99 (BP Location: Right Arm)   Pulse (!) 151 Comment: pt reports specialists aware and reports is in between medication.  Temp (!) 97.3 F (36.3 C) (Oral)   Resp 18   LMP 07/16/2013  SpO2 98%   Visual Acuity Right Eye Distance:   Left Eye  Distance:   Bilateral Distance:    Right Eye Near:   Left Eye Near:    Bilateral Near:     Physical Exam Vitals and nursing note reviewed.  Constitutional:      Appearance: Normal appearance. She is not ill-appearing.  HENT:     Head: Atraumatic.  Eyes:     Extraocular Movements: Extraocular movements intact.     Comments: Left upper and lower eyelids mildly erythematous and edematous.  Conjunctive a within normal limits.  Right eye within normal limits  Cardiovascular:     Rate and Rhythm: Tachycardia present. Rhythm irregular.  Pulmonary:     Effort: Pulmonary effort is normal.     Breath sounds: Normal breath sounds.     Comments: On continuous oxygen via nasal cannula Musculoskeletal:        General: Normal range of motion.     Cervical back: Normal range of motion and neck supple.  Skin:    General: Skin is warm and dry.  Neurological:     Mental Status: She is alert and oriented to person, place, and time.  Psychiatric:        Mood and Affect: Mood normal.        Thought Content: Thought content normal.        Judgment: Judgment normal.      UC Treatments / Results  Labs (all labs ordered are listed, but only abnormal results are displayed) Labs Reviewed - No data to display  EKG   Radiology No results found.  Procedures Procedures (including critical care time)  Medications Ordered in UC Medications - No data to display  Initial Impression / Assessment and Plan / UC Course  I have reviewed the triage vital signs and the nursing notes.  Pertinent labs & imaging results that were available during my care of the patient were reviewed by me and considered in my medical decision making (see chart for details).     Suspect allergic conjunctivitis, treat with Pataday drops, will also cover with erythromycin ointment to protect against infection while so inflamed.  Declines visual acuity today as vision is intact per patient.  Regarding her atrial  fibrillation with RVR, per chart review she saw her cardiologist yesterday who is aware, EKG is very similar to EKG performed yesterday.  Discussed with patient to go to the emergency department if becoming significantly symptomatic but she states she feels at her baseline currently.  Final Clinical Impressions(s) / UC Diagnoses   Final diagnoses:  Irritation of eyelid  Atrial fibrillation with RVR [facility])     Discharge Instructions      You can purchase allergy eye drops OTC if unable to pick up your prescription tonight. It appears they go by all sorts of brand names but look for it to say antihistamine eye drops on the package    ED Prescriptions     Medication Sig Dispense Auth. Provider   olopatadine (PATADAY) 0.1 % ophthalmic solution Place 1 drop into the left eye 2 (two) times daily. 5 mL Particia Nearing, PA-C   erythromycin ophthalmic ointment Place a 1/2 inch ribbon of ointment into the left lower eyelid BID prn. 3.5 g Particia Nearing, PA-C      PDMP not reviewed this encounter.   Particia Nearing, [place] 11/05/22 1805

## 2022-11-06 ENCOUNTER — Inpatient Hospital Stay: Payer: Medicaid Other

## 2022-11-06 VITALS — BP 115/97 | HR 65 | Temp 97.4°F | Resp 18

## 2022-11-06 DIAGNOSIS — C8208 Follicular lymphoma grade I, lymph nodes of multiple sites: Secondary | ICD-10-CM

## 2022-11-06 DIAGNOSIS — Z5112 Encounter for antineoplastic immunotherapy: Secondary | ICD-10-CM | POA: Diagnosis not present

## 2022-11-06 DIAGNOSIS — Z95828 Presence of other vascular implants and grafts: Secondary | ICD-10-CM

## 2022-11-06 MED ORDER — PEGFILGRASTIM-FPGK 6 MG/0.6ML ~~LOC~~ SOSY
6.0000 mg | PREFILLED_SYRINGE | Freq: Once | SUBCUTANEOUS | Status: AC
  Administered 2022-11-06: 6 mg via SUBCUTANEOUS
  Filled 2022-11-06: qty 0.6

## 2022-11-06 NOTE — Progress Notes (Signed)
Patient tolerated Stimufend 6 mg injection with no complaints voiced.  Site clean and dry with no bruising or swelling noted.  No complaints of pain.  Discharged with vital signs stable and no signs or symptoms of distress noted.

## 2022-11-06 NOTE — Patient Instructions (Signed)
[facility]  Discharge Instructions: Thank you for choosing [facility] to provide your oncology and hematology care.  If you have a lab appointment with the Cancer Center - please note that after April 8th, 2024, all labs will be drawn in the cancer center.  You do not have to check in or register with the main entrance as you have in the past but will complete your check-in in the cancer center.  Wear comfortable clothing and clothing appropriate for easy access to any Portacath or PICC line.   We strive to give you quality time with your provider. You may need to reschedule your appointment if you arrive late (15 or more minutes).  Arriving late affects you and other patients whose appointments are after yours.  Also, if you miss three or more appointments without notifying the office, you may be dismissed from the clinic at the provider's discretion.      For prescription refill requests, have your pharmacy contact our office and allow 72 hours for refills to be completed.    Today you received the following chemotherapy and/or immunotherapy agents Stimufend.   Pegfilgrastim Injection What is this medication? PEGFILGRASTIM (PEG fil gra stim) lowers the risk of infection in people who are receiving chemotherapy. It works by Systems analyst make more white blood cells, which protects your body from infection. It may also be used to help people who have been exposed to high doses of radiation. This medicine may be used for other purposes; ask your health care provider or pharmacist if you have questions. COMMON BRAND NAME(S): Cherly Hensen, Neulasta, Nyvepria, Stimufend, UDENYCA, UDENYCA ONBODY, Ziextenzo What should I tell my care team before I take this medication? They need to know if you have any of these conditions: Kidney disease Latex allergy Ongoing radiation therapy Sickle cell disease Skin reactions to acrylic adhesives (On-Body Injector  only) An unusual or allergic reaction to pegfilgrastim, filgrastim, other medications, foods, dyes, or preservatives Pregnant or trying to get pregnant Breast-feeding How should I use this medication? This medication is for injection under the skin. If you get this medication at home, you will be taught how to prepare and give the pre-filled syringe or how to use the On-body Injector. Refer to the patient Instructions for Use for detailed instructions. Use exactly as directed. Tell your care team immediately if you suspect that the On-body Injector may not have performed as intended or if you suspect the use of the On-body Injector resulted in a missed or partial dose. It is important that you put your used needles and syringes in a special sharps container. Do not put them in a trash can. If you do not have a sharps container, call your pharmacist or care team to get one. Talk to your care team about the use of this medication in children. While this medication may be prescribed for selected conditions, precautions do apply. Overdosage: If you think you have taken too much of this medicine contact a poison control center or emergency room at once. NOTE: This medicine is only for you. Do not share this medicine with others. What if I miss a dose? It is important not to miss your dose. Call your care team if you miss your dose. If you miss a dose due to an On-body Injector failure or leakage, a new dose should be administered as soon as possible using a single prefilled syringe for manual use. What may interact with this medication?  Interactions have not been studied. This list may not describe all possible interactions. Give your health care provider a list of all the medicines, herbs, non-prescription drugs, or dietary supplements you use. Also tell them if you smoke, drink alcohol, or use illegal drugs. Some items may interact with your medicine. What should I watch for while using this  medication? Your condition will be monitored carefully while you are receiving this medication. You may need blood work done while you are taking this medication. Talk to your care team about your risk of cancer. You may be more at risk for certain types of cancer if you take this medication. If you are going to need a MRI, CT scan, or other procedure, tell your care team that you are using this medication (On-Body Injector only). What side effects may I notice from receiving this medication? Side effects that you should report to your care team as soon as possible: Allergic reactions--skin rash, itching, hives, swelling of the face, lips, tongue, or throat Capillary leak syndrome--stomach or muscle pain, unusual weakness or fatigue, feeling faint or lightheaded, decrease in the amount of urine, swelling of the ankles, hands, or feet, trouble breathing High white blood cell level--fever, fatigue, trouble breathing, night sweats, change in vision, weight loss Inflammation of the aorta--fever, fatigue, back, chest, or stomach pain, severe headache Kidney injury (glomerulonephritis)--decrease in the amount of urine, red or dark Mallory Sutton urine, foamy or bubbly urine, swelling of the ankles, hands, or feet Shortness of breath or trouble breathing Spleen injury--pain in upper left stomach or shoulder Unusual bruising or bleeding Side effects that usually do not require medical attention (report to your care team if they continue or are bothersome): Bone pain Pain in the hands or feet This list may not describe all possible side effects. Call your doctor for medical advice about side effects. You may report side effects to FDA at 1-800-FDA-1088. Where should I keep my medication? Keep out of the reach of children. If you are using this medication at home, you will be instructed on how to store it. Throw away any unused medication after the expiration date on the label. NOTE: This sheet is a summary. It  may not cover all possible information. If you have questions about this medicine, talk to your doctor, pharmacist, or health care provider.  2024 Elsevier/Gold Standard (2021-03-21 00:00:00)          To help prevent nausea and vomiting after your treatment, we encourage you to take your nausea medication as directed.  BELOW ARE SYMPTOMS THAT SHOULD BE REPORTED IMMEDIATELY: *FEVER GREATER THAN 100.4 F (38 C) OR HIGHER *CHILLS OR SWEATING *NAUSEA AND VOMITING THAT IS NOT CONTROLLED WITH YOUR NAUSEA MEDICATION *UNUSUAL SHORTNESS OF BREATH *UNUSUAL BRUISING OR BLEEDING *URINARY PROBLEMS (pain or burning when urinating, or frequent urination) *BOWEL PROBLEMS (unusual diarrhea, constipation, pain near the anus) TENDERNESS IN MOUTH AND THROAT WITH OR WITHOUT PRESENCE OF ULCERS (sore throat, sores in mouth, or a toothache) UNUSUAL RASH, SWELLING OR PAIN  UNUSUAL VAGINAL DISCHARGE OR ITCHING   Items with * indicate a potential emergency and should be followed up as soon as possible or go to the Emergency Department if any problems should occur.  Please show the CHEMOTHERAPY ALERT CARD or IMMUNOTHERAPY ALERT CARD at check-in to the Emergency Department and triage nurse.  Should you have questions after your visit or need to cancel or reschedule your appointment, please contact [facility] [phone]  and follow the prompts.  Office hours are 8:00 a.m. to 4:30 p.m. Monday - Friday. Please note that voicemails left after 4:00 p.m. may not be returned until the following business day.  We are closed weekends and major holidays. You have access to a nurse at all times for urgent questions. Please call the main number to the clinic [phone] and follow the prompts.  For any non-urgent questions, you may also contact your provider using MyChart. We now offer e-Visits for anyone 50 and older to request care online for non-urgent symptoms. For details visit  [email or web address].   Also download the MyChart app! Go to the app store, search "MyChart", open the app, select [place], and log in with your MyChart username and password.

## 2022-11-10 ENCOUNTER — Other Ambulatory Visit: Payer: Self-pay | Admitting: *Deleted

## 2022-11-10 DIAGNOSIS — S22000A Wedge compression fracture of unspecified thoracic vertebra, initial encounter for closed fracture: Secondary | ICD-10-CM

## 2022-11-10 MED ORDER — OXYCODONE-ACETAMINOPHEN 10-325 MG PO TABS
1.0000 | ORAL_TABLET | Freq: Four times a day (QID) | ORAL | 0 refills | Status: DC | PRN
Start: 2022-11-10 — End: 2022-11-30

## 2022-11-12 ENCOUNTER — Ambulatory Visit: Payer: Medicaid Other | Attending: Cardiovascular Disease | Admitting: *Deleted

## 2022-11-12 ENCOUNTER — Encounter: Payer: Self-pay | Admitting: *Deleted

## 2022-11-12 DIAGNOSIS — I4892 Unspecified atrial flutter: Secondary | ICD-10-CM | POA: Diagnosis not present

## 2022-11-12 DIAGNOSIS — Q231 Congenital insufficiency of aortic valve: Secondary | ICD-10-CM | POA: Diagnosis not present

## 2022-11-12 DIAGNOSIS — Z5181 Encounter for therapeutic drug level monitoring: Secondary | ICD-10-CM

## 2022-11-12 LAB — POCT INR: INR: 8 — AB (ref 2.0–3.0)

## 2022-11-12 NOTE — Progress Notes (Signed)
Approval received from [facility] for Oxycodone-Acetaminophen 10/-325 until 05/11/23.

## 2022-11-12 NOTE — Patient Instructions (Signed)
INR >8.0 Had chemo 7/2 & 7/3 HOLD coumadin and come for INR check on Monday 7/15 Denies any S/S of bleeding. Bleeding and fall precautions discussed with pt and daughter Pending kyphoplasty:  Date TBD  Will hold warfarin 5 days before procedure and bridge with lovenox 40mg  twice daily with last dose am of day before surgery

## 2022-11-15 ENCOUNTER — Other Ambulatory Visit: Payer: Self-pay

## 2022-11-15 ENCOUNTER — Inpatient Hospital Stay (HOSPITAL_COMMUNITY)
Admission: EM | Admit: 2022-11-15 | Discharge: 2022-11-21 | DRG: 308 | Disposition: A | Discharge status: Home / Self Care | Payer: Medicaid Other | Attending: Internal Medicine | Admitting: Internal Medicine

## 2022-11-15 ENCOUNTER — Emergency Department (HOSPITAL_COMMUNITY): Payer: Medicaid Other

## 2022-11-15 ENCOUNTER — Encounter (HOSPITAL_COMMUNITY): Payer: Self-pay | Admitting: Emergency Medicine

## 2022-11-15 DIAGNOSIS — T458X5A Adverse effect of other primarily systemic and hematological agents, initial encounter: Secondary | ICD-10-CM | POA: Diagnosis present

## 2022-11-15 DIAGNOSIS — Z20822 Contact with and (suspected) exposure to covid-19: Secondary | ICD-10-CM | POA: Diagnosis present

## 2022-11-15 DIAGNOSIS — I5033 Acute on chronic diastolic (congestive) heart failure: Secondary | ICD-10-CM | POA: Diagnosis present

## 2022-11-15 DIAGNOSIS — Z9981 Dependence on supplemental oxygen: Secondary | ICD-10-CM

## 2022-11-15 DIAGNOSIS — R112 Nausea with vomiting, unspecified: Secondary | ICD-10-CM | POA: Diagnosis present

## 2022-11-15 DIAGNOSIS — J449 Chronic obstructive pulmonary disease, unspecified: Secondary | ICD-10-CM | POA: Diagnosis present

## 2022-11-15 DIAGNOSIS — T451X5A Adverse effect of antineoplastic and immunosuppressive drugs, initial encounter: Secondary | ICD-10-CM | POA: Diagnosis present

## 2022-11-15 DIAGNOSIS — Z813 Family history of other psychoactive substance abuse and dependence: Secondary | ICD-10-CM

## 2022-11-15 DIAGNOSIS — D6869 Other thrombophilia: Secondary | ICD-10-CM | POA: Diagnosis present

## 2022-11-15 DIAGNOSIS — D72823 Leukemoid reaction: Secondary | ICD-10-CM | POA: Diagnosis present

## 2022-11-15 DIAGNOSIS — Z818 Family history of other mental and behavioral disorders: Secondary | ICD-10-CM

## 2022-11-15 DIAGNOSIS — M4850XD Collapsed vertebra, not elsewhere classified, site unspecified, subsequent encounter for fracture with routine healing: Secondary | ICD-10-CM | POA: Diagnosis present

## 2022-11-15 DIAGNOSIS — R002 Palpitations: Principal | ICD-10-CM

## 2022-11-15 DIAGNOSIS — R791 Abnormal coagulation profile: Secondary | ICD-10-CM | POA: Diagnosis not present

## 2022-11-15 DIAGNOSIS — E871 Hypo-osmolality and hyponatremia: Secondary | ICD-10-CM | POA: Diagnosis present

## 2022-11-15 DIAGNOSIS — F32A Depression, unspecified: Secondary | ICD-10-CM | POA: Diagnosis present

## 2022-11-15 DIAGNOSIS — Z952 Presence of prosthetic heart valve: Secondary | ICD-10-CM

## 2022-11-15 DIAGNOSIS — C8200 Follicular lymphoma grade I, unspecified site: Secondary | ICD-10-CM | POA: Diagnosis present

## 2022-11-15 DIAGNOSIS — Z7982 Long term (current) use of aspirin: Secondary | ICD-10-CM

## 2022-11-15 DIAGNOSIS — Z95828 Presence of other vascular implants and grafts: Secondary | ICD-10-CM | POA: Diagnosis not present

## 2022-11-15 DIAGNOSIS — C8208 Follicular lymphoma grade I, lymph nodes of multiple sites: Secondary | ICD-10-CM | POA: Diagnosis not present

## 2022-11-15 DIAGNOSIS — I4819 Other persistent atrial fibrillation: Principal | ICD-10-CM | POA: Diagnosis present

## 2022-11-15 DIAGNOSIS — Z9221 Personal history of antineoplastic chemotherapy: Secondary | ICD-10-CM

## 2022-11-15 DIAGNOSIS — X58XXXD Exposure to other specified factors, subsequent encounter: Secondary | ICD-10-CM | POA: Diagnosis present

## 2022-11-15 DIAGNOSIS — I4892 Unspecified atrial flutter: Secondary | ICD-10-CM | POA: Diagnosis present

## 2022-11-15 DIAGNOSIS — F419 Anxiety disorder, unspecified: Secondary | ICD-10-CM | POA: Diagnosis present

## 2022-11-15 DIAGNOSIS — Z888 Allergy status to other drugs, medicaments and biological substances status: Secondary | ICD-10-CM

## 2022-11-15 DIAGNOSIS — X58XXXA Exposure to other specified factors, initial encounter: Secondary | ICD-10-CM | POA: Diagnosis present

## 2022-11-15 DIAGNOSIS — Z880 Allergy status to penicillin: Secondary | ICD-10-CM

## 2022-11-15 DIAGNOSIS — Z7952 Long term (current) use of systemic steroids: Secondary | ICD-10-CM

## 2022-11-15 DIAGNOSIS — I5032 Chronic diastolic (congestive) heart failure: Secondary | ICD-10-CM | POA: Diagnosis present

## 2022-11-15 DIAGNOSIS — D649 Anemia, unspecified: Secondary | ICD-10-CM | POA: Diagnosis not present

## 2022-11-15 DIAGNOSIS — J9611 Chronic respiratory failure with hypoxia: Secondary | ICD-10-CM | POA: Diagnosis present

## 2022-11-15 DIAGNOSIS — F1120 Opioid dependence, uncomplicated: Secondary | ICD-10-CM | POA: Diagnosis present

## 2022-11-15 DIAGNOSIS — Z79899 Other long term (current) drug therapy: Secondary | ICD-10-CM

## 2022-11-15 DIAGNOSIS — E876 Hypokalemia: Secondary | ICD-10-CM | POA: Diagnosis present

## 2022-11-15 DIAGNOSIS — Y929 Unspecified place or not applicable: Secondary | ICD-10-CM | POA: Diagnosis not present

## 2022-11-15 DIAGNOSIS — I4891 Unspecified atrial fibrillation: Secondary | ICD-10-CM | POA: Diagnosis not present

## 2022-11-15 DIAGNOSIS — Z7901 Long term (current) use of anticoagulants: Secondary | ICD-10-CM

## 2022-11-15 DIAGNOSIS — I5031 Acute diastolic (congestive) heart failure: Secondary | ICD-10-CM | POA: Diagnosis not present

## 2022-11-15 DIAGNOSIS — D6481 Anemia due to antineoplastic chemotherapy: Secondary | ICD-10-CM | POA: Diagnosis present

## 2022-11-15 DIAGNOSIS — Z5986 Financial insecurity: Secondary | ICD-10-CM

## 2022-11-15 LAB — HEPATIC FUNCTION PANEL
ALT: 22 U/L (ref 0–44)
AST: 26 U/L (ref 15–41)
Albumin: 3.6 g/dL (ref 3.5–5.0)
Alkaline Phosphatase: 193 U/L — ABNORMAL HIGH (ref 38–126)
Bilirubin, Direct: 0.3 mg/dL — ABNORMAL HIGH (ref 0.0–0.2)
Indirect Bilirubin: 0.8 mg/dL (ref 0.3–0.9)
Total Bilirubin: 1.1 mg/dL (ref 0.3–1.2)
Total Protein: 6.3 g/dL — ABNORMAL LOW (ref 6.5–8.1)

## 2022-11-15 LAB — CBC
HCT: 27.1 % — ABNORMAL LOW (ref 36.0–46.0)
Hemoglobin: 8.2 g/dL — ABNORMAL LOW (ref 12.0–15.0)
MCH: 24 pg — ABNORMAL LOW (ref 26.0–34.0)
MCHC: 30.3 g/dL (ref 30.0–36.0)
MCV: 79.5 fL — ABNORMAL LOW (ref 80.0–100.0)
Platelets: 313 10*3/uL (ref 150–400)
RBC: 3.41 MIL/uL — ABNORMAL LOW (ref 3.87–5.11)
RDW: 17.7 % — ABNORMAL HIGH (ref 11.5–15.5)
WBC: 21.7 10*3/uL — ABNORMAL HIGH (ref 4.0–10.5)
nRBC: 0 % (ref 0.0–0.2)

## 2022-11-15 LAB — BASIC METABOLIC PANEL
Anion gap: 14 (ref 5–15)
BUN: 21 mg/dL — ABNORMAL HIGH (ref 6–20)
CO2: 33 mmol/L — ABNORMAL HIGH (ref 22–32)
Calcium: 9.1 mg/dL (ref 8.9–10.3)
Chloride: 84 mmol/L — ABNORMAL LOW (ref 98–111)
Creatinine, Ser: 0.97 mg/dL (ref 0.44–1.00)
GFR, Estimated: >60 mL/min (ref >60)
Glucose, Bld: 124 mg/dL — ABNORMAL HIGH (ref 70–99)
Potassium: 3 mmol/L — ABNORMAL LOW (ref 3.5–5.1)
Sodium: 131 mmol/L — ABNORMAL LOW (ref 135–145)

## 2022-11-15 LAB — BRAIN NATRIURETIC PEPTIDE: B Natriuretic Peptide: 840 pg/mL — ABNORMAL HIGH (ref 0.0–100.0)

## 2022-11-15 LAB — MRSA NEXT GEN BY PCR, NASAL: MRSA by PCR Next Gen: NOT DETECTED

## 2022-11-15 LAB — PROTIME-INR
INR: 6.1 (ref 0.8–1.2)
Prothrombin Time: 54.1 seconds — ABNORMAL HIGH (ref 11.4–15.2)

## 2022-11-15 LAB — TROPONIN I (HIGH SENSITIVITY): Troponin I (High Sensitivity): 9 ng/L (ref <18)

## 2022-11-15 MED ORDER — ERYTHROMYCIN 5 MG/GM OP OINT
TOPICAL_OINTMENT | Freq: Every day | OPHTHALMIC | Status: DC
  Filled 2022-11-15: qty 3.5

## 2022-11-15 MED ORDER — ALPRAZOLAM 0.5 MG PO TABS
0.5000 mg | ORAL_TABLET | Freq: Three times a day (TID) | ORAL | Status: DC
  Administered 2022-11-15 – 2022-11-21 (×15): 0.5 mg via ORAL
  Filled 2022-11-15 (×15): qty 1

## 2022-11-15 MED ORDER — GABAPENTIN 300 MG PO CAPS
300.0000 mg | ORAL_CAPSULE | Freq: Two times a day (BID) | ORAL | Status: DC
  Administered 2022-11-16 – 2022-11-21 (×12): 300 mg via ORAL
  Filled 2022-11-15 (×12): qty 1

## 2022-11-15 MED ORDER — ALLOPURINOL 300 MG PO TABS
300.0000 mg | ORAL_TABLET | Freq: Every day | ORAL | Status: DC
  Administered 2022-11-16 – 2022-11-21 (×6): 300 mg via ORAL
  Filled 2022-11-15 (×6): qty 1

## 2022-11-15 MED ORDER — DILTIAZEM HCL-DEXTROSE 125-5 MG/125ML-% IV SOLN (PREMIX)
5.0000 mg/h | INTRAVENOUS | Status: DC
  Administered 2022-11-15: 5 mg/h via INTRAVENOUS
  Administered 2022-11-15: 15 mg/h via INTRAVENOUS
  Administered 2022-11-16: 5 mg/h via INTRAVENOUS
  Filled 2022-11-15 (×3): qty 125

## 2022-11-15 MED ORDER — DOFETILIDE 125 MCG PO CAPS
250.0000 ug | ORAL_CAPSULE | Freq: Two times a day (BID) | ORAL | Status: DC
  Administered 2022-11-15 – 2022-11-21 (×11): 250 ug via ORAL
  Filled 2022-11-15: qty 2
  Filled 2022-11-15: qty 1
  Filled 2022-11-15 (×2): qty 2
  Filled 2022-11-15 (×2): qty 1
  Filled 2022-11-15 (×4): qty 2
  Filled 2022-11-15 (×2): qty 1
  Filled 2022-11-15: qty 2
  Filled 2022-11-15 (×2): qty 1
  Filled 2022-11-15 (×2): qty 2

## 2022-11-15 MED ORDER — BUDESON-GLYCOPYRROL-FORMOTEROL 160-9-4.8 MCG/ACT IN AERO: 2.0000 | INHALATION_SPRAY | Freq: Two times a day (BID) | RESPIRATORY_TRACT | Status: DC

## 2022-11-15 MED ORDER — VENLAFAXINE HCL 37.5 MG PO TABS
75.0000 mg | ORAL_TABLET | Freq: Two times a day (BID) | ORAL | Status: DC
  Administered 2022-11-15 – 2022-11-21 (×11): 75 mg via ORAL
  Filled 2022-11-15 (×11): qty 2

## 2022-11-15 MED ORDER — POTASSIUM CHLORIDE 20 MEQ PO PACK: 20.0000 meq | PACK | Freq: Two times a day (BID) | ORAL | Status: DC

## 2022-11-15 MED ORDER — POTASSIUM CHLORIDE 10 MEQ/100ML IV SOLN
10.0000 meq | Freq: Once | INTRAVENOUS | Status: AC
  Administered 2022-11-15: 10 meq via INTRAVENOUS
  Filled 2022-11-15: qty 100

## 2022-11-15 MED ORDER — SODIUM CHLORIDE 0.9% FLUSH
3.0000 mL | Freq: Two times a day (BID) | INTRAVENOUS | Status: DC
  Administered 2022-11-15 – 2022-11-21 (×12): 3 mL via INTRAVENOUS

## 2022-11-15 MED ORDER — GABAPENTIN 300 MG PO CAPS
600.0000 mg | ORAL_CAPSULE | Freq: Every day | ORAL | Status: DC
  Administered 2022-11-15 – 2022-11-20 (×5): 600 mg via ORAL
  Filled 2022-11-15 (×5): qty 2

## 2022-11-15 MED ORDER — PANTOPRAZOLE SODIUM 40 MG IV SOLR
40.0000 mg | Freq: Two times a day (BID) | INTRAVENOUS | Status: DC
  Administered 2022-11-15 – 2022-11-21 (×12): 40 mg via INTRAVENOUS
  Filled 2022-11-15 (×12): qty 10

## 2022-11-15 MED ORDER — OXYCODONE HCL 5 MG PO TABS
5.0000 mg | ORAL_TABLET | Freq: Four times a day (QID) | ORAL | Status: DC | PRN
  Administered 2022-11-15 – 2022-11-20 (×12): 5 mg via ORAL
  Filled 2022-11-15 (×12): qty 1

## 2022-11-15 MED ORDER — SODIUM CHLORIDE 0.9 % IV SOLN: 250.0000 mL | INTRAVENOUS | Status: DC | PRN

## 2022-11-15 MED ORDER — MAGNESIUM OXIDE -MG SUPPLEMENT 400 (240 MG) MG PO TABS
400.0000 mg | ORAL_TABLET | Freq: Every day | ORAL | Status: DC
  Administered 2022-11-16 – 2022-11-21 (×6): 400 mg via ORAL
  Filled 2022-11-15 (×6): qty 1

## 2022-11-15 MED ORDER — SODIUM CHLORIDE 0.9% FLUSH: 3.0000 mL | INTRAVENOUS | Status: DC | PRN

## 2022-11-15 MED ORDER — SCOPOLAMINE 1 MG/3DAYS TD PT72
1.0000 | MEDICATED_PATCH | TRANSDERMAL | Status: DC
  Administered 2022-11-15 – 2022-11-18 (×2): 1.5 mg via TRANSDERMAL
  Filled 2022-11-15 (×2): qty 1

## 2022-11-15 MED ORDER — MOMETASONE FURO-FORMOTEROL FUM 200-5 MCG/ACT IN AERO
2.0000 | INHALATION_SPRAY | Freq: Two times a day (BID) | RESPIRATORY_TRACT | Status: DC
  Administered 2022-11-16 – 2022-11-21 (×10): 2 via RESPIRATORY_TRACT
  Filled 2022-11-15: qty 8.8

## 2022-11-15 MED ORDER — ACETAMINOPHEN 325 MG PO TABS: 650.0000 mg | ORAL_TABLET | ORAL | Status: DC | PRN

## 2022-11-15 MED ORDER — SODIUM CHLORIDE 0.9 % IV BOLUS
1000.0000 mL | Freq: Once | INTRAVENOUS | Status: AC
  Administered 2022-11-15: 1000 mL via INTRAVENOUS

## 2022-11-15 MED ORDER — POTASSIUM CHLORIDE 10 MEQ/100ML IV SOLN
10.0000 meq | INTRAVENOUS | Status: AC
  Administered 2022-11-15 (×2): 10 meq via INTRAVENOUS
  Filled 2022-11-15 (×2): qty 100

## 2022-11-15 MED ORDER — UMECLIDINIUM BROMIDE 62.5 MCG/ACT IN AEPB
1.0000 | INHALATION_SPRAY | Freq: Every day | RESPIRATORY_TRACT | Status: DC
  Administered 2022-11-16 – 2022-11-21 (×6): 1 via RESPIRATORY_TRACT
  Filled 2022-11-15: qty 7

## 2022-11-15 MED ORDER — FUROSEMIDE 10 MG/ML IJ SOLN
80.0000 mg | Freq: Two times a day (BID) | INTRAMUSCULAR | Status: AC
  Administered 2022-11-16 (×2): 80 mg via INTRAVENOUS
  Filled 2022-11-15 (×2): qty 8

## 2022-11-15 MED ORDER — OXYCODONE-ACETAMINOPHEN 5-325 MG PO TABS
1.0000 | ORAL_TABLET | Freq: Four times a day (QID) | ORAL | Status: DC | PRN
  Administered 2022-11-15 – 2022-11-20 (×11): 1 via ORAL
  Filled 2022-11-15 (×11): qty 1

## 2022-11-15 MED ORDER — ENSURE ENLIVE PO LIQD
237.0000 mL | Freq: Two times a day (BID) | ORAL | Status: DC
  Administered 2022-11-16 – 2022-11-21 (×9): 237 mL via ORAL

## 2022-11-15 MED ORDER — IPRATROPIUM-ALBUTEROL 0.5-2.5 (3) MG/3ML IN SOLN
3.0000 mL | Freq: Two times a day (BID) | RESPIRATORY_TRACT | Status: DC | PRN
  Filled 2022-11-15: qty 3

## 2022-11-15 MED ORDER — CHLORHEXIDINE GLUCONATE CLOTH 2 % EX PADS
6.0000 | MEDICATED_PAD | Freq: Every day | CUTANEOUS | Status: DC
  Administered 2022-11-16 – 2022-11-21 (×6): 6 via TOPICAL

## 2022-11-15 MED ORDER — PROCHLORPERAZINE EDISYLATE 10 MG/2ML IJ SOLN
10.0000 mg | Freq: Four times a day (QID) | INTRAMUSCULAR | Status: DC | PRN
  Administered 2022-11-16 – 2022-11-21 (×3): 10 mg via INTRAVENOUS
  Filled 2022-11-15 (×3): qty 2

## 2022-11-15 MED ORDER — DILTIAZEM HCL 25 MG/5ML IV SOLN
10.0000 mg | Freq: Once | INTRAVENOUS | Status: AC
  Administered 2022-11-15: 10 mg via INTRAVENOUS
  Filled 2022-11-15: qty 5

## 2022-11-15 MED ORDER — OLOPATADINE HCL 0.1 % OP SOLN
1.0000 [drp] | Freq: Two times a day (BID) | OPHTHALMIC | Status: DC
  Administered 2022-11-16 – 2022-11-21 (×11): 1 [drp] via OPHTHALMIC
  Filled 2022-11-15: qty 5

## 2022-11-15 MED ORDER — POTASSIUM CHLORIDE CRYS ER 10 MEQ PO TBCR
20.0000 meq | EXTENDED_RELEASE_TABLET | Freq: Two times a day (BID) | ORAL | Status: DC
  Administered 2022-11-15 – 2022-11-18 (×7): 20 meq via ORAL
  Filled 2022-11-15 (×6): qty 2

## 2022-11-15 MED ORDER — OXYCODONE-ACETAMINOPHEN 10-325 MG PO TABS: 1.0000 | ORAL_TABLET | Freq: Four times a day (QID) | ORAL | Status: DC | PRN

## 2022-11-15 NOTE — Progress Notes (Signed)
ANTICOAGULATION CONSULT NOTE - Initial Consult  Pharmacy Consult for Coumadin Indication: Afib s/p AVR  Allergies  Allergen Reactions   Amiodarone Nausea Only   Penicillins Hives    Has patient had a PCN reaction causing immediate rash, facial/tongue/throat swelling, SOB or lightheadedness with hypotension: YES Has patient had a PCN reaction causing severe rash involving mucus membranes or skin necrosis: NO Has patient had a PCN reaction that required hospitalization NO Has patient had a PCN reaction occurring within the last 10 years: NO If all of the above answers are "NO", then may proceed with Cephalosporin use.     Vital Signs: Temp: 97.9 F (36.6 C) (07/14 1330) Temp Source: Oral (07/14 1330) BP: 107/75 (07/14 1330) Pulse Rate: 111 (07/14 1330)  Labs: Recent Labs    11/12/22 1552 11/15/22 1138  HGB  --  8.2*  HCT  --  27.1*  PLT  --  313  LABPROT  --  54.1*  INR 8.0* 6.1*  CREATININE  --  0.97  TROPONINIHS  --  9    Estimated Creatinine Clearance: 46.8 mL/min (by C-G formula based on SCr of 0.97 mg/dL).  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 Coumadin has been on hold since 7/11 yet INR still supratherapeutic likely a result of decreased po d/t ongoing chemo-related nausea/vomiting.   Usual dosage: 3-4mg  daily  Goal of Therapy:  INR 2-3 Monitor platelets by anticoagulation protocol: Yes   Plan:  Continue to hold coumadin INR daily  Caryl Asp [facility] 11/15/2022,2:23 PM

## 2022-11-15 NOTE — ED Notes (Signed)
HR improved to low 100s brieflyafter Cardizem IVP and gtt started, immediately increased back up to 130-140s. Repeat EKG obtained, shown to MD.

## 2022-11-15 NOTE — Progress Notes (Signed)
Patient arrived to [facility]. Tachypnea noted but resolved once the patient was settled. Patient oriented to staff and unit and equipment. Cardizem infusing, heart rate is now afib 80-120. Patient was recently discharged from the unit and remembered several staff members. Safety precautions in place. Plan of care reviewed with patient and patient agree. Will continue to monitor and endorse.

## 2022-11-15 NOTE — H&P (Signed)
History and Physical    Patient: Mallory Sutton ZOX:[id number] DOB: 1961-02-12 DOA: 11/15/2022 DOS: the patient was seen and examined on 11/15/2022 PCP: Leone Payor, FNP  Patient coming from: Home  Chief Complaint:  Chief Complaint  Patient presents with   Palpitations   Shortness of Breath   HPI: Mallory Sutton is a 62 year old female with a history of persistent atrial fibrillation, follicular lymphoma stage IV, pulmonary hypertension, bicuspid aortic valve status post AVR, coarctation of the aorta status post aortic arch repair, and COPD?  Presenting with nausea, vomiting, and palpitations.  The patient has been suffering from intermittent nausea and vomiting from chemotherapy for her follicular lymphoma.  She states that her last chemotherapy/immunotherapy was received on 11/04/2022.  She has been receiving rituximab and Bendamustine under the guidance of Dr. Ellin Saba.  She states that she received an injection of Neulasta on 11/06/2022.  She has had intermittent nausea and vomiting after chemotherapy.  However she has developed intractable vomiting on 11/14/2022.  She began having palpitations.  She has noted that her heart rate has been in the 150s.  She has had some chest discomfort and shortness of breath.  As result she presented for further evaluation and treatment. In the ED, the patient was afebrile and hemodynamically stable.  Oxygen saturation was 100% on 2 L.  WBC 21.7, hemoglobin 8.2, platelets 213,000.  Sodium 131, potassium 3.0, bicarbonate 33, serum creatinine 0.97.  LFTs were unremarkable.  Chest x-ray showed vascular congestion.  INR 6.1.  BNP 840.  Troponin 9.  EKG showed atrial fibrillation with nonspecific ST and T wave changes with a heart rate in 130s.  The patient was started on diltiazem drip.  She was admitted for further evaluation and treatment of her A-fib with RVR as well as intractable nausea and vomiting.  Review of Systems: As mentioned in the history of present  illness. All other systems reviewed and are negative. Past Medical History:  Diagnosis Date   Aortic stenosis    ATRIAL ARRHYTHMIAS    Bicuspid aortic valve    CHF (congestive heart failure) (HCC)    COARCTATION OF AORTA    Dysrhythmia    ENDOMETRIOSIS    Heart murmur    Lymphoma (HCC)    Paroxysmal atrial fibrillation (HCC)    Port-A-Cath in place 08/31/2022   Shortness of breath    THORACIC AORTIC ANEURYSM    Vasculitis (HCC)    Past Surgical History:  Procedure Laterality Date   CARDIOVERSION N/A 08/29/2019   Procedure: CARDIOVERSION;  Surgeon: Antoine Poche, MD;  Location: [facility];  Service: Endoscopy;  Laterality: N/A;   CARDIOVERSION N/A 07/16/2022   Procedure: CARDIOVERSION;  Surgeon: Sande Rives, MD;  Location: [facility] ENDOSCOPY;  Service: Cardiovascular;  Laterality: N/A;   CARDIOVERSION N/A 10/15/2022   Procedure: CARDIOVERSION;  Surgeon: Antoine Poche, MD;  Location: [facility];  Service: Endoscopy;  Laterality: N/A;   coarctation repair and residual restenosis     IR BONE MARROW BIOPSY & ASPIRATION  08/10/2022   IR IMAGING GUIDED PORT INSERTION  08/10/2022   KNEE SURGERY     LYMPH NODE BIOPSY Right 06/25/2022   Procedure: LYMPH NODE BIOPSY; right groin;  Surgeon: Lucretia Roers, MD;  Location: [facility];  Service: General;  Laterality: Right;   PORTA CATH INSERTION     TEE WITHOUT CARDIOVERSION N/A 07/12/2019   Procedure: TRANSESOPHAGEAL ECHOCARDIOGRAM (TEE);  Surgeon: Quintella Reichert, MD;  Location[facility] END[facility]COPY;  Service: Cardiovascular;  Laterality: N/A;  TEE WITHOUT CARDIOVERSION N/A 08/29/2019   Procedure: TRANSESOPHAGEAL ECHOCARDIOGRAM (TEE) WITH PROPOFOL;  Surgeon: Antoine Poche, MD;  Location: [facility];  Service: Endoscopy;  Laterality: N/A;   TEE WITHOUT CARDIOVERSION N/A 10/15/2022   Procedure: TRANSESOPHAGEAL ECHOCARDIOGRAM (TEE);  Surgeon: Antoine Poche, MD;  Location: [facility];  Service: Endoscopy;  Laterality: N/A;   Social History:   reports that she has never smoked. She has been exposed to tobacco smoke. She has never used smokeless tobacco. She reports current alcohol use. She reports that she does not use drugs.  Allergies  Allergen Reactions   Amiodarone Nausea Only   Penicillins Hives    Has patient had a PCN reaction causing immediate rash, facial/tongue/throat swelling, SOB or lightheadedness with hypotension: YES Has patient had a PCN reaction causing severe rash involving mucus membranes or skin necrosis: NO Has patient had a PCN reaction that required hospitalization NO Has patient had a PCN reaction occurring within the last 10 years: NO If all of the above answers are "NO", then may proceed with Cephalosporin use.     Family History  Problem Relation Age of Onset   Suicidality Mother    Mental illness Mother    Drug abuse Brother    Healthy Daughter    Healthy Son     Prior to Admission medications   Medication Sig Start Date End Date Taking? Authorizing Provider  acetaminophen (TYLENOL) 325 MG tablet Take 2 tablets (650 mg total) by mouth every 6 (six) hours as needed for mild pain (or Fever >/= 101). 06/27/22   Emokpae, Courage, MD  albuterol (VENTOLIN HFA) 108 (90 Base) MCG/ACT inhaler Inhale 2 puffs into the lungs every 4 (four) hours as needed for wheezing or shortness of breath. 06/27/22   Shon Hale, MD  allopurinol (ZYLOPRIM) 300 MG tablet Take 1 tablet (300 mg total) by mouth daily. 08/31/22   Doreatha Massed, MD  ALPRAZolam Prudy Feeler) 0.5 MG tablet Take 0.5 mg by mouth 3 (three) times daily. 07/01/22   [provider]  aspirin EC 81 MG tablet Take 1 tablet (81 mg total) by mouth daily with breakfast. 06/27/22   Mariea Clonts, Courage, MD  BENDAMUSTINE HCL IV Inject into the vein every 28 (twenty-eight) days. Days 1&2 every 28 days 09/08/22   [provider]  Budeson-Glycopyrrol-Formoterol (BREZTRI AEROSPHERE) 160-9-4.8 MCG/ACT AERO Inhale 2 puffs into the lungs 2 (two) times  daily. 06/27/22   Shon Hale, MD  calcium-vitamin D (OSCAL WITH D) 500-5 MG-MCG tablet Take 1 tablet by mouth daily. 11/10/21   Vassie Loll, MD  dofetilide (TIKOSYN) 250 MCG capsule Take 1 capsule (250 mcg total) by mouth 2 (two) times daily. 08/12/22   Graciella Freer, PA-C  erythromycin ophthalmic ointment Place a 1/2 inch ribbon of ointment into the left lower eyelid BID prn. 11/05/22   Particia Nearing, PA-C  feeding supplement (ENSURE ENLIVE / ENSURE PLUS) LIQD Take 237 mLs by mouth 2 (two) times daily between meals. 11/10/21   Vassie Loll, MD  furosemide (LASIX) 40 MG tablet Take 2 tablets (80 mg total) by mouth daily. 06/27/22   Shon Hale, MD  gabapentin (NEURONTIN) 300 MG capsule Take 300-600 mg by mouth See admin instructions. Take 300 mg tablet by mouth in the morning, 300 mg tablet by mouth in the afternoon, and then take two of the 300 mg tablets by mouth for total of 600 mg in the evening per patient    [provider]  ipratropium-albuterol (DUONEB) 0.5-2.5 (3) MG/3ML  SOLN Inhale 3 mLs into the lungs 2 (two) times daily as needed (shortness of breath). 06/27/22   Shon Hale, MD  lidocaine-prilocaine (EMLA) cream Apply a small amount to port a cath site and cover with plastic wrap 1 hour prior to infusion appointments 08/31/22   Doreatha Massed, MD  magnesium oxide (MAGOX 400) 400 (240 Mg) MG tablet Take 1 tablet (400 mg total) by mouth daily. 02/06/22   Croitoru, Mihai, MD  metoprolol succinate (TOPROL-XL) 100 MG 24 hr tablet Take 1.5 tablets (150 mg total) by mouth daily. Take with or immediately following a meal. 10/28/22 01/26/23  Marinus Maw, MD  Multiple Vitamin (MULTI-VITAMINS) TABS Take 1 tablet by mouth daily.     [provider]  olopatadine (PATADAY) 0.1 % ophthalmic solution Place 1 drop into the left eye 2 (two) times daily. 11/05/22   Particia Nearing, PA-C  oxyCODONE-acetaminophen (PERCOCET) 10-325 MG tablet Take 1  tablet by mouth every 6 (six) hours as needed. for pain 11/10/22   Doreatha Massed, MD  pantoprazole (PROTONIX) 40 MG tablet Take 1 tablet (40 mg total) by mouth daily. 10/09/22   Doreatha Massed, MD  potassium chloride SA (KLOR-CON M) 20 MEQ tablet Take 2 tablets (40 mEq total) by mouth 3 (three) times daily. 09/24/22   Carnella Guadalaj[place], PA-C  predniSONE (DELTASONE) 20 MG tablet Take 1 tablet (20 mg total) by mouth daily with breakfast. 04/29/22   Wendall Stade, MD  riTUXimab (RITUXAN IV) Inject into the vein every 28 (twenty-eight) days. 09/08/22   [provider]  scopolamine (TRANSDERM-SCOP) 1 MG/3DAYS Place 1 patch (1.5 mg total) onto the skin every 3 (three) days. 10/06/22   Doreatha Massed, MD  venlafaxine (EFFEXOR) 75 MG tablet Take 75 mg by mouth 2 (two) times daily.    [provider]  warfarin (COUMADIN) 1 MG tablet Take warfarin 4 to 5 tablets daily or as directed by coumadin clinic Patient taking differently: Take 1 mg by mouth as directed. Take warfarin 4 to 5 tablets daily or as directed by coumadin clinic 09/23/22   Wendall Stade, MD  zolpidem (AMBIEN) 10 MG tablet Take 1 tablet (10 mg total) by mouth at bedtime as needed for sleep. 06/27/22 10/15/22  Shon Hale, MD    Physical Exam: Vitals:   11/15/22 1245 11/15/22 1300 11/15/22 1315 11/15/22 1330  BP: 116/80 116/84 120/65 107/75  Pulse: (!) 115 (!) 117 (!) 101 (!) 111  Resp: (!) 25 (!) 25 (!) 31 (!) 28  Temp:    97.9 F (36.6 C)  TempSrc:    Oral  SpO2: 100% 100% 100% 100%  Weight:      Height:       GENERAL:  A&O x 3, NAD, well developed, cooperative, follows commands HEENT: [place]/AT, No thrush, No icterus, No oral ulcers Neck:  No neck mass, No meningismus, soft, supple positive JVD CV: IRRR, no S3, no S4, no rub, no JVD Lungs: Bibasal crackles.  No wheezing or Abd: soft/NT +BS, nondistended Ext: 2+ lower extremity edema, no lymphangitis, no cyanosis, no rashes Neuro:  CN II-XII  intact, strength 4/5 in RUE, RLE, strength 4/5 LUE, LLE; sensation intact bilateral; no dysmetria; babinski equivocal  Data Reviewed: Data reviewed above in the history Assessment and Plan: Persistent atrial fibrillation with RVR -The patient was started on diltiazem drip--continue -If rates remain poorly controlled, start IV amiodarone -Restart dofetilide -10/15/2022 echo EF 55-60%, normal RVEF, mild MR, mechanical AVR -Optimize electrolytes  Intractable nausea  and vomiting -Therapy has been limited secondary to her dofetilide -Use Compazine if possible and necessary -Considering metoclopramide as well for intractable vomiting -Pantoprazole IV twice daily  HFpEF -The patient has signs of fluid overload -Furosemide IV for the next 24 hours and reassess on 11/16/2022  Leukemoid reaction -This may be from the patient's recent Neulasta injection -Check differential -Check PCT -Blood cultures x 2 sets -UA and urine culture -Defer antibiotics presently as the patient is hemodynamically stable and afebrile -Monitor clinically  Chronic respiratory failure with hypoxia -Patient is chronically on 2 L nasal cannula -Stable presently on 2 L  Mechanical aortic valve--status post AVR for bicuspid aortic valve -INR is supratherapeutic -Pharmacy to assist with dosing of warfarin  Supratherapeutic INR -Since the patient's hemoglobin is stable with no active bleeding, will allow the patient's INR to drift downward without reversing warfarin at this time  Stage IV follicular lymphoma -outpatient follow-up with Dr. Ellin Saba -Last dose of chemotherapy with rituximab and Bendamustine 11/04/2022.  Opioid dependence -PDMP reviewed -Percocet 10/325, #60, last refill 09/30/2022 -Alprazolam 0.5 mg, #90, last refill 10/05/2022  History vertebral compression fracture -Continue Percocet as discussed above  Hypokalemia -Replete -Check mag  Depression/anxiety -Continue Effexor   Advance Care  Planning: FULL  Consults: none  Family Communication: spouse updated 7/14  Severity of Illness: The appropriate patient status for this patient is INPATIENT. Inpatient status is judged to be reasonable and necessary in order to provide the required intensity of service to ensure the patient's safety. The patient's presenting symptoms, physical exam findings, and initial radiographic and laboratory data in the context of their chronic comorbidities is felt to place them at high risk for further clinical deterioration. Furthermore, it is not anticipated that the patient will be medically stable for discharge from the hospital within 2 midnights of admission.   * I certify that at the point of admission it is my clinical judgment that the patient will require inpatient hospital care spanning beyond 2 midnights from the point of admission due to high intensity of service, high risk for further deterioration and high frequency of surveillance required.*  Author: Catarina Hartshorn, MD 11/15/2022 1:53 PM  For on call review [email or web address].

## 2022-11-15 NOTE — ED Triage Notes (Signed)
Pt via POV c/o palpitations with HR 150-160 x 2 weeks and associated SOB. She has back pain due to known compression fractures in her spine. Pt has also been vomiting and reports 5 episodes of emesis since yesterday morning. Pt is currently undergoing treatment for follicular lymphoma with her 3rd chemo treatment last week.

## 2022-11-15 NOTE — Hospital Course (Signed)
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  Presenting with nausea, vomiting, and palpitations.  The patient has been suffering from intermittent nausea and vomiting from chemotherapy for her follicular lymphoma.  She states that her last chemotherapy/immunotherapy was received on 11/04/2022.  She has been receiving rituximab and Bendamustine under the guidance of Dr. Ellin Saba.  She states that she received an injection of Neulasta on 11/06/2022.  She has had intermittent nausea and vomiting after chemotherapy.  However she has developed intractable vomiting on 11/14/2022.  She began having palpitations.  She has noted that her heart rate has been in the 150s.  She has had some chest discomfort and shortness of breath.  As result she presented for further evaluation and treatment. In the ED, the patient was afebrile and hemodynamically stable.  Oxygen saturation was 100% on 2 L.  WBC 21.7, hemoglobin 8.2, platelets 213,000.  Sodium 131, potassium 3.0, bicarbonate 33, serum creatinine 0.97.  LFTs were unremarkable.  Chest x-ray showed vascular congestion.  INR 6.1.  BNP 840.  Troponin 9.  EKG showed atrial fibrillation with nonspecific ST and T wave changes with a heart rate in 130s.  The patient was started on diltiazem drip.  She was admitted for further evaluation and treatment of her A-fib with RVR as well as intractable nausea and vomiting.

## 2022-11-15 NOTE — ED Provider Notes (Signed)
[place] [facility] Provider Note   CSN: [id number] Arrival date & time: 11/15/22  1119     History  Chief Complaint  Patient presents with   Palpitations   Shortness of Breath    Mallory Sutton is a 62 y.o. female.  Patient has a history of lymphoma and atrial fibs.  She has been vomiting for the last 24 hours.  And now is having palpitations.  The history is provided by the patient and medical records. No language interpreter was used.  Palpitations Palpitations quality:  Irregular Onset quality:  Unable to specify Timing:  Constant Progression:  Worsening Chronicity:  Recurrent Relieved by:  Nothing Worsened by:  Nothing Ineffective treatments:  None tried Associated symptoms: shortness of breath   Associated symptoms: no back pain, no chest pain and no cough   Shortness of Breath Associated symptoms: no abdominal pain, no chest pain, no cough, no headaches and no rash        Home Medications Prior to Admission medications   Medication Sig Start Date End Date Taking? Authorizing Provider  acetaminophen (TYLENOL) 325 MG tablet Take 2 tablets (650 mg total) by mouth every 6 (six) hours as needed for mild pain (or Fever >/= 101). 06/27/22   Emokpae, Courage, MD  albuterol (VENTOLIN HFA) 108 (90 Base) MCG/ACT inhaler Inhale 2 puffs into the lungs every 4 (four) hours as needed for wheezing or shortness of breath. 06/27/22   Shon Hale, MD  allopurinol (ZYLOPRIM) 300 MG tablet Take 1 tablet (300 mg total) by mouth daily. 08/31/22   Doreatha Massed, MD  ALPRAZolam Prudy Feeler) 0.5 MG tablet Take 0.5 mg by mouth 3 (three) times daily. 07/01/22   [provider]  aspirin EC 81 MG tablet Take 1 tablet (81 mg total) by mouth daily with breakfast. 06/27/22   Mariea Clonts, Courage, MD  BENDAMUSTINE HCL IV Inject into the vein every 28 (twenty-eight) days. Days 1&2 every 28 days 09/08/22   [provider]   Budeson-Glycopyrrol-Formoterol (BREZTRI AEROSPHERE) 160-9-4.8 MCG/ACT AERO Inhale 2 puffs into the lungs 2 (two) times daily. 06/27/22   Shon Hale, MD  calcium-vitamin D (OSCAL WITH D) 500-5 MG-MCG tablet Take 1 tablet by mouth daily. 11/10/21   Vassie Loll, MD  dofetilide (TIKOSYN) 250 MCG capsule Take 1 capsule (250 mcg total) by mouth 2 (two) times daily. 08/12/22   Graciella Freer, PA-C  erythromycin ophthalmic ointment Place a 1/2 inch ribbon of ointment into the left lower eyelid BID prn. 11/05/22   Particia Nearing, PA-C  feeding supplement (ENSURE ENLIVE / ENSURE PLUS) LIQD Take 237 mLs by mouth 2 (two) times daily between meals. 11/10/21   Vassie Loll, MD  furosemide (LASIX) 40 MG tablet Take 2 tablets (80 mg total) by mouth daily. 06/27/22   Shon Hale, MD  gabapentin (NEURONTIN) 300 MG capsule Take 300-600 mg by mouth See admin instructions. Take 300 mg tablet by mouth in the morning, 300 mg tablet by mouth in the afternoon, and then take two of the 300 mg tablets by mouth for total of 600 mg in the evening per patient    [provider]  ipratropium-albuterol (DUONEB) 0.5-2.5 (3) MG/3ML SOLN Inhale 3 mLs into the lungs 2 (two) times daily as needed (shortness of breath). 06/27/22   Shon Hale, MD  lidocaine-prilocaine (EMLA) cream Apply a small amount to port a cath site and cover with plastic wrap 1 hour prior to infusion appointments 08/31/22   Ellin Saba,  Vern Claude, MD  magnesium oxide (MAGOX 400) 400 (240 Mg) MG tablet Take 1 tablet (400 mg total) by mouth daily. 02/06/22   Croitoru, Mihai, MD  metoprolol succinate (TOPROL-XL) 100 MG 24 hr tablet Take 1.5 tablets (150 mg total) by mouth daily. Take with or immediately following a meal. 10/28/22 01/26/23  Marinus Maw, MD  Multiple Vitamin (MULTI-VITAMINS) TABS Take 1 tablet by mouth daily.     [provider]  olopatadine (PATADAY) 0.1 % ophthalmic solution Place 1 drop into the left eye 2  (two) times daily. 11/05/22   Particia Nearing, PA-C  oxyCODONE-acetaminophen (PERCOCET) 10-325 MG tablet Take 1 tablet by mouth every 6 (six) hours as needed. for pain 11/10/22   Doreatha Massed, MD  pantoprazole (PROTONIX) 40 MG tablet Take 1 tablet (40 mg total) by mouth daily. 10/09/22   Doreatha Massed, MD  potassium chloride SA (KLOR-CON M) 20 MEQ tablet Take 2 tablets (40 mEq total) by mouth 3 (three) times daily. 09/24/22   Carnella Guadalajara, PA-C  predniSONE (DELTASONE) 20 MG tablet Take 1 tablet (20 mg total) by mouth daily with breakfast. 04/29/22   Wendall Stade, MD  riTUXimab (RITUXAN IV) Inject into the vein every 28 (twenty-eight) days. 09/08/22   [provider]  scopolamine (TRANSDERM-SCOP) 1 MG/3DAYS Place 1 patch (1.5 mg total) onto the skin every 3 (three) days. 10/06/22   Doreatha Massed, MD  venlafaxine (EFFEXOR) 75 MG tablet Take 75 mg by mouth 2 (two) times daily.    [provider]  warfarin (COUMADIN) 1 MG tablet Take warfarin 4 to 5 tablets daily or as directed by coumadin clinic Patient taking differently: Take 1 mg by mouth as directed. Take warfarin 4 to 5 tablets daily or as directed by coumadin clinic 09/23/22   Wendall Stade, MD  zolpidem (AMBIEN) 10 MG tablet Take 1 tablet (10 mg total) by mouth at bedtime as needed for sleep. 06/27/22 10/15/22  Shon Hale, MD      Allergies    Amiodarone and Penicillins    Review of Systems   Review of Systems  Constitutional:  Negative for appetite change and fatigue.  HENT:  Negative for congestion, ear discharge and sinus pressure.   Eyes:  Negative for discharge.  Respiratory:  Positive for shortness of breath. Negative for cough.   Cardiovascular:  Positive for palpitations. Negative for chest pain.  Gastrointestinal:  Negative for abdominal pain and diarrhea.  Genitourinary:  Negative for frequency and hematuria.  Musculoskeletal:  Negative for back pain.  Skin:  Negative for  rash.  Neurological:  Negative for seizures and headaches.  Psychiatric/Behavioral:  Negative for hallucinations.     Physical Exam Updated Vital Signs BP 120/65   Pulse (!) 101   Temp 97.6 F (36.4 C) (Oral)   Resp (!) 31   Ht 5\' 4"  (1.626 m)   Wt 48.1 kg   LMP 07/16/2013   SpO2 100%   BMI 18.19 kg/m  Physical Exam Vitals and nursing note reviewed.  Constitutional:      Appearance: She is well-developed.  HENT:     Head: Normocephalic.     Nose: Nose normal.  Eyes:     General: No scleral icterus.    Conjunctiva/sclera: Conjunctivae normal.  Neck:     Thyroid: No thyromegaly.  Cardiovascular:     Rate and Rhythm: Tachycardia present. Rhythm irregular.     Heart sounds: No murmur heard.    No friction rub. No gallop.  Pulmonary:     Breath sounds: No stridor. No wheezing or rales.  Chest:     Chest wall: No tenderness.  Abdominal:     General: There is no distension.     Tenderness: There is no abdominal tenderness. There is no rebound.  Musculoskeletal:        General: Normal range of motion.     Cervical back: Neck supple.  Lymphadenopathy:     Cervical: No cervical adenopathy.  Skin:    Findings: No erythema or rash.  Neurological:     Mental Status: She is alert and oriented to person, place, and time.     Motor: No abnormal muscle tone.     Coordination: Coordination normal.  Psychiatric:        Behavior: Behavior normal.     ED Results / Procedures / Treatments   Labs (all labs ordered are listed, but only abnormal results are displayed) Labs Reviewed  BASIC METABOLIC PANEL - Abnormal; Notable for the following components:      Result Value   Sodium 131 (*)    Potassium 3.0 (*)    Chloride 84 (*)    CO2 33 (*)    Glucose, Bld 124 (*)    BUN 21 (*)    All other components within normal limits  CBC - Abnormal; Notable for the following components:   WBC 21.7 (*)    RBC 3.41 (*)    Hemoglobin 8.2 (*)    HCT 27.1 (*)    MCV 79.5 (*)    MCH  24.0 (*)    RDW 17.7 (*)    All other components within normal limits  PROTIME-INR - Abnormal; Notable for the following components:   Prothrombin Time 54.1 (*)    INR 6.1 (*)    All other components within normal limits  BRAIN NATRIURETIC PEPTIDE - Abnormal; Notable for the following components:   B Natriuretic Peptide 840.0 (*)    All other components within normal limits  HEPATIC FUNCTION PANEL - Abnormal; Notable for the following components:   Total Protein 6.3 (*)    Alkaline Phosphatase 193 (*)    Bilirubin, Direct 0.3 (*)    All other components within normal limits  TROPONIN I (HIGH SENSITIVITY)    EKG None  Radiology DG Chest Port 1 View  Result Date: 11/15/2022 CLINICAL DATA:  Atrial fibrillation. EXAM: PORTABLE CHEST 1 VIEW COMPARISON:  October 14, 2022 FINDINGS: Stable support apparatus. Stable postsurgical changes. Enlarged cardiac silhouette. Minimal prominence of the interstitium. IMPRESSION: 1. Stable support apparatus. 2. Minimal prominence of the interstitium may represent pulmonary vascular congestion. 3. Enlarged cardiac silhouette. Electronically Signed   By: Ted Mcalpine M.D.   On: 11/15/2022 12:11    Procedures Procedures    Medications Ordered in ED Medications  diltiazem (CARDIZEM) 125 mg in dextrose 5% 125 mL (1 mg/mL) infusion (10 mg/hr Intravenous Rate/Dose Change 11/15/22 1227)  potassium chloride 10 mEq in 100 mL IVPB (has no administration in time range)  diltiazem (CARDIZEM) injection 10 mg (10 mg Intravenous Given 11/15/22 1207)  sodium chloride 0.9 % bolus 1,000 mL (0 mLs Intravenous Stopped 11/15/22 1234)    ED Course/ Medical Decision Making/ A&P   CRITICAL CARE Performed by: Bethann Berkshire Total critical care time: 45 minutes Critical care time was exclusive of separately billable procedures and treating other patients. Critical care was necessary to treat or prevent imminent or life-threatening deterioration. Critical care was time  spent personally by me on the  following activities: development of treatment plan with patient and/or surrogate as well as nursing, discussions with consultants, evaluation of patient's response to treatment, examination of patient, obtaining history from patient or surrogate, ordering and performing treatments and interventions, ordering and review of laboratory studies, ordering and review of radiographic studies, pulse oximetry and re-evaluation of patient's condition.  Click here for ABCD2, HEART and other calculatorsREFRESH Note before signing :1}                          Medical Decision Making Amount and/or Complexity of Data Reviewed Labs: ordered. Radiology: ordered.  Risk Prescription drug management. Decision regarding hospitalization.  This patient presents to the ED for concern of palpitations, this involves an extensive number of treatment options, and is a complaint that carries with it a high risk of complications and morbidity.  The differential diagnosis includes atrial fibs,   Co morbidities that complicate the patient evaluation  Lymphoma   Additional history obtained:  Additional history obtained from patient External records from outside source obtained and reviewed including hospital records   Lab Tests:  I Ordered, and personally interpreted labs.  The pertinent results include: Hemoglobin 8.2   Imaging Studies ordered:  I ordered imaging studies including chest x-ray I independently visualized and interpreted imaging which showed vascular congestion I agree with the radiologist interpretation   Cardiac Monitoring: / EKG:  The patient was maintained on a cardiac monitor.  I personally viewed and interpreted the cardiac monitored which showed an underlying rhythm of: Atrial fibs with rapid rate   Consultations Obtained:  I requested consultation with the hospitalist,  and discussed lab and imaging findings as well as pertinent plan - they  recommend: Admit   Problem List / ED Course / Critical interventions / Medication management  Lymphoma and atrial fibs I ordered medication including Cardizem for atrial fibs Reevaluation of the patient after these medicines showed that the patient improved I have reviewed the patients home medicines and have made adjustments as needed   Social Determinants of Health:  None   Test / Admission - Considered:  None  Patient with dehydration and rapid atrial fibs.  She has been placed on Cardizem to control her rate and has been hydrated with normal saline.  She will be admitted to medicine        Final Clinical Impression(s) / ED Diagnoses Final diagnoses:  Palpitations  Atrial fibrillation with [facility])    Rx / DC Orders ED Discharge Orders     None         Bethann Berkshire, MD 11/16/22 1635

## 2022-11-15 NOTE — Plan of Care (Signed)

## 2022-11-15 NOTE — Plan of Care (Signed)
  Problem: Education: Goal: Knowledge of General Education information will improve Description: Including pain rating scale, medication(s)/side effects and non-pharmacologic comfort measures 11/15/2022 2355 by Corrie Mckusick, RN Outcome: Progressing 11/15/2022 2054 by Corrie Mckusick, RN Outcome: Progressing   Problem: Health Behavior/Discharge Planning: Goal: Ability to manage health-related needs will improve 11/15/2022 2355 by Corrie Mckusick, RN Outcome: Progressing 11/15/2022 2054 by Corrie Mckusick, RN Outcome: Progressing   Problem: Clinical Measurements: Goal: Ability to maintain clinical measurements within normal limits will improve 11/15/2022 2355 by Corrie Mckusick, RN Outcome: Progressing 11/15/2022 2054 by Corrie Mckusick, RN Outcome: Progressing Goal: Will remain free from infection 11/15/2022 2355 by Corrie Mckusick, RN Outcome: Progressing 11/15/2022 2054 by Corrie Mckusick, RN Outcome: Progressing Goal: Diagnostic test results will improve 11/15/2022 2355 by Corrie Mckusick, RN Outcome: Progressing 11/15/2022 2054 by Corrie Mckusick, RN Outcome: Progressing Goal: Respiratory complications will improve 11/15/2022 2355 by Corrie Mckusick, RN Outcome: Progressing 11/15/2022 2054 by Corrie Mckusick, RN Outcome: Progressing Goal: Cardiovascular complication will be avoided 11/15/2022 2355 by Corrie Mckusick, RN Outcome: Progressing 11/15/2022 2054 by Corrie Mckusick, RN Outcome: Progressing   Problem: Activity: Goal: Risk for activity intolerance will decrease 11/15/2022 2355 by Corrie Mckusick, RN Outcome: Progressing 11/15/2022 2054 by Corrie Mckusick, RN Outcome: Progressing   Problem: Nutrition: Goal: Adequate nutrition will be maintained 11/15/2022 2355 by Corrie Mckusick, RN Outcome: Progressing 11/15/2022 2054 by Corrie Mckusick, RN Outcome: Progressing   Problem: Coping: Goal: Level of anxiety will decrease 11/15/2022  2355 by Corrie Mckusick, RN Outcome: Progressing 11/15/2022 2054 by Corrie Mckusick, RN Outcome: Progressing   Problem: Elimination: Goal: Will not experience complications related to bowel motility 11/15/2022 2355 by Corrie Mckusick, RN Outcome: Progressing 11/15/2022 2054 by Corrie Mckusick, RN Outcome: Progressing Goal: Will not experience complications related to urinary retention 11/15/2022 2355 by Corrie Mckusick, RN Outcome: Progressing 11/15/2022 2054 by Corrie Mckusick, RN Outcome: Progressing   Problem: Pain Managment: Goal: General experience of comfort will improve 11/15/2022 2355 by Corrie Mckusick, RN Outcome: Progressing 11/15/2022 2054 by Corrie Mckusick, RN Outcome: Progressing   Problem: Safety: Goal: Ability to remain free from injury will improve 11/15/2022 2355 by Corrie Mckusick, RN Outcome: Progressing 11/15/2022 2054 by Corrie Mckusick, RN Outcome: Progressing   Problem: Skin Integrity: Goal: Risk for impaired skin integrity will decrease 11/15/2022 2355 by Corrie Mckusick, RN Outcome: Progressing 11/15/2022 2054 by Corrie Mckusick, RN Outcome: Progressing   Problem: Education: Goal: Ability to demonstrate management of disease process will improve 11/15/2022 2355 by Corrie Mckusick, RN Outcome: Progressing 11/15/2022 2054 by Corrie Mckusick, RN Outcome: Progressing Goal: Ability to verbalize understanding of medication therapies will improve 11/15/2022 2355 by Corrie Mckusick, RN Outcome: Progressing 11/15/2022 2054 by Corrie Mckusick, RN Outcome: Progressing Goal: Individualized Educational Video(s) 11/15/2022 2355 by Corrie Mckusick, RN Outcome: Progressing 11/15/2022 2054 by Corrie Mckusick, RN Outcome: Progressing   Problem: Activity: Goal: Capacity to carry out activities will improve 11/15/2022 2355 by Corrie Mckusick, RN Outcome: Progressing 11/15/2022 2054 by Corrie Mckusick, RN Outcome: Progressing    Problem: Cardiac: Goal: Ability to achieve and maintain adequate cardiopulmonary perfusion will improve 11/15/2022 2355 by Corrie Mckusick, RN Outcome: Progressing 11/15/2022 2054 by Corrie Mckusick, RN Outcome: Progressing

## 2022-11-15 NOTE — ED Notes (Signed)
Per Dr. Estell Harpin, increase cardizem gtt to 10 mg/hr.

## 2022-11-16 DIAGNOSIS — Z7901 Long term (current) use of anticoagulants: Secondary | ICD-10-CM

## 2022-11-16 DIAGNOSIS — I5032 Chronic diastolic (congestive) heart failure: Secondary | ICD-10-CM | POA: Diagnosis not present

## 2022-11-16 DIAGNOSIS — I4819 Other persistent atrial fibrillation: Secondary | ICD-10-CM | POA: Diagnosis not present

## 2022-11-16 DIAGNOSIS — I4891 Unspecified atrial fibrillation: Secondary | ICD-10-CM | POA: Diagnosis not present

## 2022-11-16 DIAGNOSIS — R791 Abnormal coagulation profile: Secondary | ICD-10-CM | POA: Diagnosis not present

## 2022-11-16 DIAGNOSIS — C8200 Follicular lymphoma grade I, unspecified site: Secondary | ICD-10-CM | POA: Diagnosis not present

## 2022-11-16 DIAGNOSIS — I5033 Acute on chronic diastolic (congestive) heart failure: Secondary | ICD-10-CM

## 2022-11-16 DIAGNOSIS — D649 Anemia, unspecified: Secondary | ICD-10-CM

## 2022-11-16 LAB — URINALYSIS, W/ REFLEX TO CULTURE (INFECTION SUSPECTED)
Bilirubin Urine: NEGATIVE
Glucose, UA: NEGATIVE mg/dL
Hgb urine dipstick: NEGATIVE
Ketones, ur: NEGATIVE mg/dL
Leukocytes,Ua: NEGATIVE
Nitrite: NEGATIVE
Protein, ur: 30 mg/dL — AB
Specific Gravity, Urine: 1.012 (ref 1.005–1.030)
pH: 6 (ref 5.0–8.0)

## 2022-11-16 LAB — CBC
HCT: 24.1 % — ABNORMAL LOW (ref 36.0–46.0)
Hemoglobin: 7.3 g/dL — ABNORMAL LOW (ref 12.0–15.0)
MCH: 24.3 pg — ABNORMAL LOW (ref 26.0–34.0)
MCHC: 30.3 g/dL (ref 30.0–36.0)
MCV: 80.3 fL (ref 80.0–100.0)
Platelets: 261 10*3/uL (ref 150–400)
RBC: 3 MIL/uL — ABNORMAL LOW (ref 3.87–5.11)
RDW: 17.5 % — ABNORMAL HIGH (ref 11.5–15.5)
WBC: 38.4 10*3/uL — ABNORMAL HIGH (ref 4.0–10.5)
nRBC: 0 % (ref 0.0–0.2)

## 2022-11-16 LAB — BASIC METABOLIC PANEL
Anion gap: 11 (ref 5–15)
BUN: 18 mg/dL (ref 6–20)
CO2: 31 mmol/L (ref 22–32)
Calcium: 8.5 mg/dL — ABNORMAL LOW (ref 8.9–10.3)
Chloride: 89 mmol/L — ABNORMAL LOW (ref 98–111)
Creatinine, Ser: 0.88 mg/dL (ref 0.44–1.00)
GFR, Estimated: >60 mL/min (ref >60)
Glucose, Bld: 128 mg/dL — ABNORMAL HIGH (ref 70–99)
Potassium: 3.7 mmol/L (ref 3.5–5.1)
Sodium: 131 mmol/L — ABNORMAL LOW (ref 135–145)

## 2022-11-16 LAB — PROTIME-INR
INR: 8.9 (ref 0.8–1.2)
Prothrombin Time: 73.1 seconds — ABNORMAL HIGH (ref 11.4–15.2)

## 2022-11-16 LAB — PROCALCITONIN: Procalcitonin: 0.26 ng/mL

## 2022-11-16 LAB — MAGNESIUM: Magnesium: 2.1 mg/dL (ref 1.7–2.4)

## 2022-11-16 MED ORDER — POTASSIUM CHLORIDE CRYS ER 20 MEQ PO TBCR
20.0000 meq | EXTENDED_RELEASE_TABLET | Freq: Once | ORAL | Status: AC
  Filled 2022-11-16: qty 1

## 2022-11-16 MED ORDER — DILTIAZEM HCL ER COATED BEADS 180 MG PO CP24
180.0000 mg | ORAL_CAPSULE | Freq: Every day | ORAL | Status: DC
  Administered 2022-11-16 – 2022-11-21 (×6): 180 mg via ORAL
  Filled 2022-11-16 (×6): qty 1

## 2022-11-16 NOTE — Progress Notes (Signed)
ANTICOAGULATION CONSULT NOTE -   Pharmacy Consult for Coumadin Indication: Afib s/p AVR  Allergies  Allergen Reactions   Amiodarone Nausea Only   Penicillins Hives    Has patient had a PCN reaction causing immediate rash, facial/tongue/throat swelling, SOB or lightheadedness with hypotension: YES Has patient had a PCN reaction causing severe rash involving mucus membranes or skin necrosis: NO Has patient had a PCN reaction that required hospitalization NO Has patient had a PCN reaction occurring within the last 10 years: NO If all of the above answers are "NO", then may proceed with Cephalosporin use.     Vital Signs: Temp: 98.7 F (37.1 C) (07/15 0732) Temp Source: Oral (07/15 0732) BP: 120/65 (07/15 0739) Pulse Rate: 69 (07/15 0739)  Labs: Recent Labs    11/15/22 1138 11/16/22 0415  HGB 8.2* 7.3*  HCT 27.1* 24.1*  PLT 313 261  LABPROT 54.1* 73.1*  INR 6.1* 8.9*  CREATININE 0.97 0.88  TROPONINIHS 9  --     Estimated Creatinine Clearance: 52.8 mL/min (by C-G formula based on SCr of 0.88 mg/dL).  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 Coumadin has been on hold since 7/11 yet INR still supratherapeutic likely a result of decreased po d/t ongoing chemo-related nausea/vomiting.   Usual dosage: 4 mg on Tue and 3 mg ROW  INR 6.1 > 8.9 Hgb 7.3- monitor  Goal of Therapy:  INR 2-3 Monitor platelets by anticoagulation protocol: Yes   Plan:  Continue to hold coumadin INR daily  Judeth Cornfield, PharmD Clinical Pharmacist 11/16/2022 9:24 AM

## 2022-11-16 NOTE — Progress Notes (Signed)
Pharmacy: Dofetilide (Tikosyn) - Follow Up Assessment and Electrolyte Replacement  Pharmacy consulted to assist in monitoring and replacing electrolytes in this 62 y.o. female admitted on 11/15/2022 undergoing dofetilide   Labs:    Component Value Date/Time   K 3.7 11/16/2022 0415   MG 2.1 11/16/2022 0415     Plan: Potassium: K 3.8-3.9:  Give KCl 40 mEq po x1   Magnesium: Mg > 2: No additional supplementation needed    Thank you for allowing pharmacy to participate in this patient's care   Judeth Cornfield, PharmD Clinical Pharmacist 11/16/2022 9:20 AM

## 2022-11-16 NOTE — Plan of Care (Signed)

## 2022-11-16 NOTE — Progress Notes (Signed)
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  Presenting with nausea, vomiting, and palpitations.  The patient has been suffering from intermittent nausea and vomiting from chemotherapy for her follicular lymphoma.  She states that her last chemotherapy/immunotherapy was received on 11/04/2022.  She has been receiving rituximab and Bendamustine under the guidance of Dr. Ellin Saba.  She states that she received an injection of Neulasta on 11/06/2022.  She has had intermittent nausea and vomiting after chemotherapy.  However she has developed intractable vomiting on 11/14/2022.  She began having palpitations.  She has noted that her heart rate has been in the 150s.  She has had some chest discomfort and shortness of breath.  As result she presented for further evaluation and treatment. In the ED, the patient was afebrile and hemodynamically stable.  Oxygen saturation was 100% on 2 L.  WBC 21.7, hemoglobin 8.2, platelets 213,000.  Sodium 131, potassium 3.0, bicarbonate 33, serum creatinine 0.97.  LFTs were unremarkable.  Chest x-ray showed vascular congestion.  INR 6.1.  BNP 840.  Troponin 9.  EKG showed atrial fibrillation with nonspecific ST and T wave changes with a heart rate in 130s.  The patient was started on diltiazem drip.  She was admitted for further evaluation and treatment of her A-fib with RVR as well as intractable nausea and vomiting.   Assessment/Plan: Persistent atrial fibrillation with RVR -The patient was started on diltiazem drip -converted back to sinus in early am 7/15 -pt states she takes metoprolol succinate 75 mg daily>>restart -wean off diltiazem -continue  dofetilide -10/15/2022 echo EF 55-60%, normal RVEF, mild MR, mechanical AVR -Optimize electrolytes -consult cardiology   Intractable nausea and vomiting -Therapy has been limited secondary to her dofetilide -Use Compazine if possible and necessary -Considering metoclopramide as well for intractable vomiting -Pantoprazole IV twice daily -slowly improving -advance to full liquid   Chronic HFpEF -The patient has signs of fluid overload -Furosemide IV was ordered for 7/14 but was not given -dose lasix 80 mg IV today and reassess clinically   Leukemoid reaction -This may be from the patient's recent Neulasta injection -Check differential -Check PCT -Blood cultures x 2 sets -UA and urine culture -Defer antibiotics presently as the patient is hemodynamically stable and afebrile -Monitor clinically   Chronic respiratory failure with hypoxia -Patient is chronically on 2 L nasal cannula -Stable presently on 2 L   Mechanical aortic valve--status post AVR for bicuspid aortic valve -INR is supratherapeutic -Pharmacy to assist with dosing of warfarin   Supratherapeutic INR -Since the patient's hemoglobin is stable with no active bleeding, will allow the patient's INR to drift downward without reversing warfarin at this time   Stage IV follicular lymphoma -outpatient follow-up with Dr. Ellin Saba -Last dose of chemotherapy with rituximab and Bendamustine 11/04/2022.   Opioid dependence -PDMP reviewed -Percocet 10/325, #60, last refill 09/30/2022 -Alprazolam 0.5 mg, #90, last refill 10/05/2022   History vertebral compression fracture -Continue Percocet as discussed above   Hypokalemia -Replete -Check mag 2.1   Depression/anxiety -Continue Effexor and alprazolam as discussed  Port-a-cath in place -local care      Family Communication:   no Family at bedside  Consultants:  cardiology  Code Status:  FULL   DVT Prophylaxis:  warfarin   Procedures: As Listed in Progress  Note Above  Antibiotics: None       Subjective: Pt states n/v are improving.  One episode of dry heave last night.  Denies f/c, cp, sob, abd pain, diarrhea  Objective: Vitals:   11/16/22 0300 11/16/22 0400 11/16/22 0500 11/16/22 0600  BP: (!) 103/59 (!) 102/54 (!) 120/52 109/68  Pulse: 65 68 65 63  Resp: 14 14 14 17   Temp:  98.7 F (37.1 C)    TempSrc:  Oral    SpO2: 100% 100% 100% 100%  Weight:   49.2 kg   Height:        Intake/Output Summary (Last 24 hours) at 11/16/2022 0726 Last data filed at 11/16/2022 0444 Gross per 24 hour  Intake 1500.52 ml  Output --  Net 1500.52 ml   Weight change:  Exam:  General:  Pt is alert, follows commands appropriately, not in acute distress HEENT: No icterus, No thrush, No neck mass, [place]/AT Cardiovascular: RRR, S1/S2, no rubs, no gallops Respiratory: bibasilar crackles Abdomen: Soft/+BS, non tender, non distended, no guarding Extremities: 1 + LE edema, No lymphangitis, No petechiae, No rashes, no synovitis   Data Reviewed: I have personally reviewed following labs and imaging studies Basic Metabolic Panel: Recent Labs  Lab 11/15/22 1138 11/16/22 0415  NA 131* 131*  K 3.0* 3.7  CL 84* 89*  CO2 33* 31  GLUCOSE 124* 128*  BUN 21* 18  CREATININE 0.97 0.88  CALCIUM 9.1 8.5*  MG  --  2.1   Liver Function Tests: Recent Labs  Lab 11/15/22 1138  AST 26  ALT 22  ALKPHOS 193*  BILITOT 1.1  PROT 6.3*  ALBUMIN 3.6   No results for input(s): "LIPASE", "AMYLASE" in the last 168 hours. No results for input(s): "AMMONIA" in the last 168 hours. Coagulation Profile: Recent Labs  Lab 11/12/22 1552 11/15/22 1138  INR 8.0* 6.1*   CBC: Recent Labs  Lab 11/15/22 1138 11/16/22 0415  WBC 21.7* 38.4*  HGB 8.2* 7.3*  HCT 27.1* 24.1*  MCV 79.5* 80.3  PLT 313 261   Cardiac Enzymes: No results for input(s): "CKTOTAL", "CKMB", "CKMBINDEX", "TROPONINI" in the last 168 hours. BNP: Invalid input(s): "POCBNP" CBG: No  results for input(s): "GLUCAP" in the last 168 hours. HbA1C: No results for input(s): "HGBA1C" in the last 72 hours. Urine analysis:    Component Value Date/Time   COLORURINE YELLOW 10/15/2022 0009   APPEARANCEUR HAZY (A) 10/15/2022 0009   LABSPEC 1.023 10/15/2022 0009   PHURINE 5.0 10/15/2022 0009   GLUCOSEU NEGATIVE 10/15/2022 0009   HGBUR NEGATIVE 10/15/2022 0009   BILIRUBINUR NEGATIVE 10/15/2022 0009   KETONESUR NEGATIVE 10/15/2022 0009   PROTEINUR 100 (A) 10/15/2022 0009   NITRITE NEGATIVE 10/15/2022 0009   LEUKOCYTESUR NEGATIVE 10/15/2022 0009   Sepsis Labs: @LABRCNTIP (procalcitonin:4,lacticidven:4) ) Recent Results (from the past 240 hour(s))  MRSA Next Gen by PCR, Nasal     Status: None   Collection Time: 11/15/22  2:36 PM   Specimen: Nasal Mucosa; Nasal Swab  Result Value Ref Range Status   MRSA by PCR Next Gen NOT DETECTED NOT DETECTED Final    Comment: (NOTE) The GeneXpert MRSA Assay (FDA approved for NASAL specimens only), is one component of a comprehensive MRSA colonization surveillance program. It is not intended to diagnose MRSA infection nor to guide or monitor treatment for MRSA infections. Test performance is not FDA approved in patients less than 55 years old.  Performed at [facility], [address] [place], [place] [address]      Scheduled Meds:  allopurinol  300 mg Oral Daily   ALPRAZolam  0.5 mg Oral TID   Chlorhexidine Gluconate Cloth  6 each Topical Q0600   dofetilide  250 mcg Oral BID   erythromycin   Left Eye QHS   feeding supplement  237 mL Oral BID BM   furosemide  80 mg Intravenous BID   gabapentin  300 mg Oral BID   gabapentin  600 mg Oral QHS   magnesium oxide  400 mg Oral Daily   mometasone-formoterol  2 puff Inhalation BID   olopatadine  1 drop Left Eye BID   pantoprazole (PROTONIX) IV  40 mg Intravenous Q12H   potassium chloride  20 mEq Oral BID   scopolamine  1 patch Transdermal Q72H   sodium chloride flush  3 mL Intravenous  Q12H   umeclidinium bromide  1 puff Inhalation Daily   venlafaxine  75 mg Oral BID   Continuous Infusions:  sodium chloride     diltiazem (CARDIZEM) infusion 5 mg/hr (11/16/22 0516)    Procedures/Studies: DG Chest Port 1 View  Result Date: 11/15/2022 CLINICAL DATA:  Atrial fibrillation. EXAM: PORTABLE CHEST 1 VIEW COMPARISON:  October 14, 2022 FINDINGS: Stable support apparatus. Stable postsurgical changes. Enlarged cardiac silhouette. Minimal prominence of the interstitium. IMPRESSION: 1. Stable support apparatus. 2. Minimal prominence of the interstitium may represent pulmonary vascular congestion. 3. Enlarged cardiac silhouette. Electronically Signed   By: Ted Mcalpine M.D.   On: 11/15/2022 12:11    Catarina Hartshorn, DO  [facility]  If 7PM-7AM, please contact night-coverage [email or web address] Password [email or web address] 11/16/2022, 7:26 AM   LOS: 1 day

## 2022-11-16 NOTE — TOC Initial Note (Signed)
Transition of Care [facility]) - Initial/Assessment Note    Patient Details  Name: Mallory Sutton MRN: [id number] Date of Birth: 01/07/1961  Transition of Care [facility]) CM/SW Contact:    Karn Cassis, LCSW Phone Number: 11/16/2022, 8:46 AM  Clinical Narrative: Pt admitted with persistent atrial fibrillation with RVR. Assessment completed due to high risk readmission score. Pt reports she lives with her husband and adult son. She is on 2L home O2 ([employer]). Pt is independent with ADLs. No home health prior to admission. Her daughter provides transportation to appointments. Pt plans to return home when medically stable. No needs reported at this time. TOC will continue to follow.   TOC received consult for CHF screening. Pt reports she weighs herself daily. She follows heart healthy diet and takes medications as prescribed. CHF education added to AVS.                   Expected Discharge Plan: Home/Self Care Barriers to Discharge: Continued Medical Work up   Patient Goals and CMS Choice Patient states their goals for this hospitalization and ongoing recovery are:: return home          Expected Discharge Plan and Services In-house Referral: Clinical Social Work     Living arrangements for the past 2 months: Single Family Home                                      Prior Living Arrangements/Services Living arrangements for the past 2 months: Single Family Home Lives with:: Spouse, Adult Children Patient language and need for interpreter reviewed:: Yes Do you feel safe going back to the place where you live?: Yes      Need for Family Participation in Patient Care: No (Comment)   Current home services: DME (home O2) Criminal Activity/Legal Involvement Pertinent to Current Situation/Hospitalization: No - Comment as needed  Activities of Daily Living Home Assistive Devices/Equipment: Oxygen ADL Screening (condition at time of admission) Patient's cognitive ability  adequate to safely complete daily activities?: Yes Is the patient deaf or have difficulty hearing?: No Does the patient have difficulty seeing, even when wearing glasses/contacts?: No Does the patient have difficulty concentrating, remembering, or making decisions?: No Patient able to express need for assistance with ADLs?: Yes Does the patient have difficulty dressing or bathing?: No Independently performs ADLs?: Yes (appropriate for developmental age) Does the patient have difficulty walking or climbing stairs?: No Weakness of Legs: Both Weakness of Arms/Hands: None  Permission Sought/Granted                  Emotional Assessment     Affect (typically observed): Appropriate Orientation: : Oriented to Self, Oriented to Place, Oriented to Situation, Oriented to  Time Alcohol / Substance Use: Not Applicable Psych Involvement: No (comment)  Admission diagnosis:  Palpitations [R00.2] Atrial fibrillation with RVR (HCC) [I48.91] Patient Active Problem List   Diagnosis Date Noted   Intractable nausea and vomiting 11/15/2022   Port-A-Cath in place 08/31/2022   Hypercoagulable state due to persistent atrial fibrillation (HCC) 07/14/2022   Persistent atrial fibrillation (HCC) 07/14/2022   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   Adenopathy 06/24/2022   Generalized lymphadenopathy 06/23/2022   Chronic respiratory failure with hypoxia (HCC) 06/23/2022   Hearing loss 06/03/2022   Numbness    Hypocalcemia    GERD (gastroesophageal reflux disease) 11/09/2021   Leukocytosis 11/09/2021   Protein-calorie malnutrition,  severe (HCC) 07/19/2021   Acute respiratory failure with hypoxia (HCC) 07/17/2021   Hypokalemia 07/17/2021   Anxiety and depression 07/17/2021   Chronic diastolic CHF (congestive heart failure) (HCC) 07/17/2021   COPD (chronic obstructive pulmonary disease) (HCC) 07/16/2021   Vasculitis (HCC) 12/31/2020   Bilateral hand pain 12/31/2020   Benign skin lesion of  thigh 10/15/2020   Atrial flutter (HCC)    Acute diastolic heart failure (HCC) 08/23/2014   Pleural effusion 08/23/2014   Pleural effusion, bilateral 08/23/2014   Dyspnea    Acute respiratory failure (HCC) 08/22/2014   Atrial fibrillation with RVR (HCC) 08/06/2014   Encounter for therapeutic drug monitoring 08/06/2014   S/P aortic valve replacement-07/25/14 [facility] 08/06/2014   Shortness of breath 04/23/2010   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 [place] [address] Phone: [phone] Fax: [phone]  [id number] [place], [place] - [address] AT [facility] [address] [place] [address] Phone: [phone] Fax: [phone]     Social Determinants of Health (SDOH) Social History: SDOH Screenings   Food Insecurity: No Food Insecurity (11/15/2022)  Housing: Patient Declined (11/15/2022)  Transportation Needs: No Transportation Needs (11/15/2022)  Utilities: Not At Risk (11/15/2022)  Depression (PHQ2-9): Medium Risk (06/23/2022)  Financial Resource Strain: High Risk (07/02/2022)  Tobacco Use: Low Risk  (11/15/2022)   SDOH Interventions:     Readmission Risk Interventions    11/16/2022    8:45 AM  Readmission Risk Prevention Plan  Transportation Screening Complete  Medication Review (RN Care Manager) Complete  HRI or Home Care Consult Complete  SW Recovery Care/Counseling Consult Complete  Palliative Care Screening  Not Applicable  Skilled Nursing Facility Not Applicable

## 2022-11-16 NOTE — Consult Note (Addendum)
Cardiology Consultation   Patient ID: AMIR GLAUS MRN: [id number]; DOB: 14-Aug-1960  Admit date: 11/15/2022 Date of Consult: 11/16/2022  PCP:  Leone Payor, FNP    [facility] Providers Cardiologist:  Charlton Haws, MD  Electrophysiologist:  Lewayne Bunting, MD       Patient Profile:   Mallory Sutton is a 62 y.o. female with a hx of coarctation repair age 49, AVR 2016, PAF, follicular lymphoma stage IV who is being seen 11/16/2022 for the evaluation of Afib with RVR at the request of Dr. Arbutus Leas.  History of Present Illness:   Ms. Pair with long history of PAF who saw Dr. Ladona Ridgel recently and was having more Afib with RVR while undergoing chemo for follicular lymphoma. The hope was that she'd be better controlled after she finished chemo. He recommended amiodarone if needed.   Patient is feeling better since she converted to NSR sometime during the night. She says she had terrible nausea and vomiting on amiodarone in the past and hoping to avoid this. She says she came in more for N/V than the afib. She has another PET scan 7/24 to determine how many more chemo treatments she needs. She also needs surgery on compression fractures in her back. Legs swelling worse since on chemo.   Past Medical History:  Diagnosis Date   Aortic stenosis    ATRIAL ARRHYTHMIAS    Bicuspid aortic valve    CHF (congestive heart failure) (HCC)    COARCTATION OF AORTA    Dysrhythmia    ENDOMETRIOSIS    Heart murmur    Lymphoma (HCC)    Paroxysmal atrial fibrillation (HCC)    Port-A-Cath in place 08/31/2022   Shortness of breath    THORACIC AORTIC ANEURYSM    Vasculitis (HCC)     Past Surgical History:  Procedure Laterality Date   CARDIOVERSION N/A 08/29/2019   Procedure: CARDIOVERSION;  Surgeon: Antoine Poche, MD;  Location: [facility];  Service: Endoscopy;  Laterality: N/A;   CARDIOVERSION N/A 07/16/2022   Procedure: CARDIOVERSION;  Surgeon: Sande Rives, MD;  Location: [facility]  ENDOSCOPY;  Service: Cardiovascular;  Laterality: N/A;   CARDIOVERSION N/A 10/15/2022   Procedure: CARDIOVERSION;  Surgeon: Antoine Poche, MD;  Location: [facility];  Service: Endoscopy;  Laterality: N/A;   coarctation repair and residual restenosis     IR BONE MARROW BIOPSY & ASPIRATION  08/10/2022   IR IMAGING GUIDED PORT INSERTION  08/10/2022   KNEE SURGERY     LYMPH NODE BIOPSY Right 06/25/2022   Procedure: LYMPH NODE BIOPSY; right groin;  Surgeon: Lucretia Roers, MD;  Location: [facility];  Service: General;  Laterality: Right;   PORTA CATH INSERTION     TEE WITHOUT CARDIOVERSION N/A 07/12/2019   Procedure: TRANSESOPHAGEAL ECHOCARDIOGRAM (TEE);  Surgeon: Quintella Reichert, MD;  Location: [facility] ENDOSCOPY;  Service: Cardiovascular;  Laterality: N/A;   TEE WITHOUT CARDIOVERSION N/A 08/29/2019   Procedure: TRANSESOPHAGEAL ECHOCARDIOGRAM (TEE) WITH PROPOFOL;  Surgeon: Antoine Poche, MD;  Location: [facility];  Service: Endoscopy;  Laterality: N/A;   TEE WITHOUT CARDIOVERSION N/A 10/15/2022   Procedure: TRANSESOPHAGEAL ECHOCARDIOGRAM (TEE);  Surgeon: Antoine Poche, MD;  Location: [facility];  Service: Endoscopy;  Laterality: N/A;     Home Medications:  Prior to Admission medications   Medication Sig Start Date End Date Taking? Authorizing Provider  acetaminophen (TYLENOL) 325 MG tablet Take 2 tablets (650 mg total) by mouth every 6 (six) hours as needed for mild pain (or Fever >/=  101). 06/27/22  Yes Emokpae, Courage, MD  allopurinol (ZYLOPRIM) 300 MG tablet Take 1 tablet (300 mg total) by mouth daily. 08/31/22  Yes Doreatha Massed, MD  ALPRAZolam Prudy Feeler) 0.5 MG tablet Take 0.5 mg by mouth 3 (three) times daily. 07/01/22  Yes [provider]  aspirin EC 81 MG tablet Take 1 tablet (81 mg total) by mouth daily with breakfast. 06/27/22  Yes Emokpae, Courage, MD  BENDAMUSTINE HCL IV Inject into the vein every 28 (twenty-eight) days. Days 1&2 every 28 days 09/08/22  Yes [provider]  Budeson-Glycopyrrol-Formoterol (BREZTRI AEROSPHERE) 160-9-4.8 MCG/ACT AERO Inhale 2 puffs into the lungs 2 (two) times daily. 06/27/22  Yes Emokpae, Courage, MD  calcium-vitamin D (OSCAL WITH D) 500-5 MG-MCG tablet Take 1 tablet by mouth daily. 11/10/21  Yes Vassie Loll, MD  dofetilide (TIKOSYN) 250 MCG capsule Take 1 capsule (250 mcg total) by mouth 2 (two) times daily. 08/12/22  Yes Graciella Freer, PA-C  erythromycin ophthalmic ointment Place a 1/2 inch ribbon of ointment into the left lower eyelid BID prn. 11/05/22  Yes Particia Nearing, PA-C  feeding supplement (ENSURE ENLIVE / ENSURE PLUS) LIQD Take 237 mLs by mouth 2 (two) times daily between meals. 11/10/21  Yes Vassie Loll, MD  furosemide (LASIX) 40 MG tablet Take 2 tablets (80 mg total) by mouth daily. 06/27/22  Yes Emokpae, Courage, MD  gabapentin (NEURONTIN) 300 MG capsule Take 300-600 mg by mouth See admin instructions. Take 300 mg tablet by mouth in the morning, 300 mg tablet by mouth in the afternoon, and then take two of the 300 mg tablets by mouth for total of 600 mg in the evening per patient   Yes [provider]  ipratropium-albuterol (DUONEB) 0.5-2.5 (3) MG/3ML SOLN Inhale 3 mLs into the lungs 2 (two) times daily as needed (shortness of breath). 06/27/22  Yes Shon Hale, MD  lidocaine-prilocaine (EMLA) cream Apply a small amount to port a cath site and cover with plastic wrap 1 hour prior to infusion appointments 08/31/22  Yes Doreatha Massed, MD  magnesium oxide (MAGOX 400) 400 (240 Mg) MG tablet Take 1 tablet (400 mg total) by mouth daily. 02/06/22  Yes Croitoru, Mihai, MD  metoprolol succinate (TOPROL-XL) 100 MG 24 hr tablet Take 1.5 tablets (150 mg total) by mouth daily. Take with or immediately following a meal. 10/28/22 01/26/23 Yes Marinus Maw, MD  Multiple Vitamin (MULTI-VITAMINS) TABS Take 1 tablet by mouth daily.    Yes [provider]  olopatadine (PATADAY) 0.1 % ophthalmic  solution Place 1 drop into the left eye 2 (two) times daily. 11/05/22  Yes Particia Nearing, PA-C  oxyCODONE-acetaminophen (PERCOCET) 10-325 MG tablet Take 1 tablet by mouth every 6 (six) hours as needed. for pain Patient taking differently: Take 1 tablet by mouth every 6 (six) hours as needed for pain. 2 in AM and 2 In PM 11/10/22  Yes Doreatha Massed, MD  pantoprazole (PROTONIX) 40 MG tablet Take 1 tablet (40 mg total) by mouth daily. 10/09/22  Yes Doreatha Massed, MD  potassium chloride SA (KLOR-CON M) 20 MEQ tablet Take 2 tablets (40 mEq total) by mouth 3 (three) times daily. 09/24/22  Yes Pennington, Rebekah M, PA-C  predniSONE (DELTASONE) 20 MG tablet Take 1 tablet (20 mg total) by mouth daily with breakfast. 04/29/22  Yes Wendall Stade, MD  riTUXimab (RITUXAN IV) Inject into the vein every 28 (twenty-eight) days. 09/08/22  Yes [provider]  scopolamine (TRANSDERM-SCOP) 1 MG/3DAYS Place 1  patch (1.5 mg total) onto the skin every 3 (three) days. 10/06/22  Yes Doreatha Massed, MD  venlafaxine (EFFEXOR) 75 MG tablet Take 75 mg by mouth 2 (two) times daily.   Yes [provider]  warfarin (COUMADIN) 1 MG tablet Take warfarin 4 to 5 tablets daily or as directed by coumadin clinic Patient taking differently: Take 1 mg by mouth as directed. Take warfarin 4 to 5 tablets daily or as directed by coumadin clinic 09/23/22  Yes Wendall Stade, MD  zolpidem (AMBIEN) 10 MG tablet Take 1 tablet (10 mg total) by mouth at bedtime as needed for sleep. 06/27/22 11/16/22 Yes Emokpae, Courage, MD  albuterol (VENTOLIN HFA) 108 (90 Base) MCG/ACT inhaler Inhale 2 puffs into the lungs every 4 (four) hours as needed for wheezing or shortness of breath. 06/27/22   Shon Hale, MD    Inpatient Medications: Scheduled Meds:  allopurinol  300 mg Oral Daily   ALPRAZolam  0.5 mg Oral TID   Chlorhexidine Gluconate Cloth  6 each Topical Q0600   dofetilide  250 mcg Oral BID   erythromycin    Left Eye QHS   feeding supplement  237 mL Oral BID BM   furosemide  80 mg Intravenous BID   gabapentin  300 mg Oral BID   gabapentin  600 mg Oral QHS   magnesium oxide  400 mg Oral Daily   mometasone-formoterol  2 puff Inhalation BID   olopatadine  1 drop Left Eye BID   pantoprazole (PROTONIX) IV  40 mg Intravenous Q12H   potassium chloride  20 mEq Oral BID   scopolamine  1 patch Transdermal Q72H   sodium chloride flush  3 mL Intravenous Q12H   umeclidinium bromide  1 puff Inhalation Daily   venlafaxine  75 mg Oral BID   Continuous Infusions:  sodium chloride     diltiazem (CARDIZEM) infusion 5 mg/hr (11/16/22 0516)   PRN Meds: sodium chloride, acetaminophen, ipratropium-albuterol, oxyCODONE-acetaminophen **AND** oxyCODONE, prochlorperazine, sodium chloride flush  Allergies:    Allergies  Allergen Reactions   Amiodarone Nausea Only   Penicillins Hives    Has patient had a PCN reaction causing immediate rash, facial/tongue/throat swelling, SOB or lightheadedness with hypotension: YES Has patient had a PCN reaction causing severe rash involving mucus membranes or skin necrosis: NO Has patient had a PCN reaction that required hospitalization NO Has patient had a PCN reaction occurring within the last 10 years: NO If all of the above answers are "NO", then may proceed with Cephalosporin use.     Social History:   Social History   Socioeconomic History   Marital status: Married    Spouse name: Not on file   Number of children: Not on file   Years of education: Not on file   Highest education level: Not on file  Occupational History   Not on file  Tobacco Use   Smoking status: Never    Passive exposure: Past   Smokeless tobacco: Never   Tobacco comments:    Never smoke 07/14/22  Vaping Use   Vaping status: Never Used  Substance and Sexual Activity   Alcohol use: Yes    Comment: 3 beers 3 nights a week 07/14/22   Drug use: No   Sexual activity: Never  Other Topics  Concern   Not on file  Social History Narrative   Not on file   Social Determinants of Health   Financial Resource Strain: High Risk (07/02/2022)   Overall Physicist, medical Strain (  CARDIA)    Difficulty of Paying Living Expenses: Very hard  Food Insecurity: No Food Insecurity (11/15/2022)   Hunger Vital Sign    Worried About Running Out of Food in the Last Year: Never true    Ran Out of Food in the Last Year: Never true  Transportation Needs: No Transportation Needs (11/15/2022)   PRAPARE - Administrator, Civil Service (Medical): No    Lack of Transportation (Non-Medical): No  Physical Activity: Not on file  Stress: Not on file  Social Connections: Not on file  Intimate Partner Violence: Not At Risk (11/15/2022)   Humiliation, Afraid, Rape, and Kick questionnaire    Fear of Current or Ex-Partner: No    Emotionally Abused: No    Physically Abused: No    Sexually Abused: No    Family History:     Family History  Problem Relation Age of Onset   Suicidality Mother    Mental illness Mother    Drug abuse Brother    Healthy Daughter    Healthy Son      ROS:  Please see the history of present illness.  Review of Systems  Constitutional: Negative.  HENT: Negative.    Eyes: Negative.   Cardiovascular:  Positive for dyspnea on exertion, irregular heartbeat, leg swelling and palpitations.  Respiratory: Negative.    Hematologic/Lymphatic: Negative.   Musculoskeletal: Negative.  Negative for joint pain.  Gastrointestinal:  Positive for nausea and vomiting.  Genitourinary: Negative.   Neurological: Negative.     All other ROS reviewed and negative.     Physical Exam/Data:   Vitals:   11/16/22 0500 11/16/22 0600 11/16/22 0732 11/16/22 0739  BP: (!) 120/52 109/68  120/65  Pulse: 65 63  69  Resp: 14 17  19   Temp:   98.7 F (37.1 C)   TempSrc:   Oral   SpO2: 100% 100%  98%  Weight: 49.2 kg     Height:        Intake/Output Summary (Last 24 hours) at  11/16/2022 0749 Last data filed at 11/16/2022 0444 Gross per 24 hour  Intake 1500.52 ml  Output --  Net 1500.52 ml      11/16/2022    5:00 AM 11/15/2022    2:27 PM 11/15/2022   11:36 AM  Last 3 Weights  Weight (lbs) 108 lb 7.5 oz 108 lb 7.5 oz 106 lb  Weight (kg) 49.2 kg 49.2 kg 48.081 kg     Body mass index is 18.62 kg/m.  General: Thin, in no acute distress  HEENT: normal Neck: increased JVD Vascular: No carotid bruits; Distal pulses 2+ bilaterally Cardiac:  normal S1, S2; RRR 4/6 systolic murmur LSB Lungs:  bibasilar rales  Abd: soft, nontender, no hepatomegaly  Ext: trace edema Musculoskeletal:  No deformities, BUE and BLE strength normal and equal Skin: warm and dry  Neuro:  CNs 2-12 intact, no focal abnormalities noted Psych:  Normal affect   EKG:  The EKG was personally reviewed and demonstrates:  afib with RVR, PVC's, LVH Telemetry:  Telemetry was personally reviewed and demonstrates:  NSR with PVC's  Relevant CV Studies:   Echo TEE 10/2022  LaIMPRESSIONS     1. Left ventricular ejection fraction, by estimation, is 55 to 60%. The  left ventricle has normal function.   2. Right ventricular systolic function is normal. The right ventricular  size is normal.   3. Left atrial size was moderately dilated. No left atrial/left atrial  appendage thrombus was  detected. The LAA emptying velocity was 15 cm/s.   4. Right atrial size was moderately dilated.   5. The mitral valve is abnormal. Mild mitral valve regurgitation. No  evidence of mitral stenosis.   6. The tricuspid valve is abnormal.   7. 19mm on X mechanical valve is in the AV position. . The aortic valve  has been repaired/replaced. Aortic valve regurgitation is not visualized.  No aortic stenosis is present.  boratory Data:  High Sensitivity Troponin:   Recent Labs  Lab 11/15/22 1138  TROPONINIHS 9     Chemistry Recent Labs  Lab 11/15/22 1138 11/16/22 0415  NA 131* 131*  K 3.0* 3.7  CL 84* 89*   CO2 33* 31  GLUCOSE 124* 128*  BUN 21* 18  CREATININE 0.97 0.88  CALCIUM 9.1 8.5*  MG  --  2.1  GFRNONAA >60 >60  ANIONGAP 14 11    Recent Labs  Lab 11/15/22 1138  PROT 6.3*  ALBUMIN 3.6  AST 26  ALT 22  ALKPHOS 193*  BILITOT 1.1   Lipids No results for input(s): "CHOL", "TRIG", "HDL", "LABVLDL", "LDLCALC", "CHOLHDL" in the last 168 hours.  Hematology Recent Labs  Lab 11/15/22 1138 11/16/22 0415  WBC 21.7* 38.4*  RBC 3.41* 3.00*  HGB 8.2* 7.3*  HCT 27.1* 24.1*  MCV 79.5* 80.3  MCH 24.0* 24.3*  MCHC 30.3 30.3  RDW 17.7* 17.5*  PLT 313 261   Thyroid No results for input(s): "TSH", "FREET4" in the last 168 hours.  BNP Recent Labs  Lab 11/15/22 1138  BNP 840.0*    DDimer No results for input(s): "DDIMER" in the last 168 hours.   Radiology/Studies:  DG Chest Port 1 View  Result Date: 11/15/2022 CLINICAL DATA:  Atrial fibrillation. EXAM: PORTABLE CHEST 1 VIEW COMPARISON:  October 14, 2022 FINDINGS: Stable support apparatus. Stable postsurgical changes. Enlarged cardiac silhouette. Minimal prominence of the interstitium. IMPRESSION: 1. Stable support apparatus. 2. Minimal prominence of the interstitium may represent pulmonary vascular congestion. 3. Enlarged cardiac silhouette. Electronically Signed   By: Ted Mcalpine M.D.   On: 11/15/2022 12:11     Assessment and Plan:   Afib/flutter with RVR uncontrolled since on chemo. Recently saw Dr. Ladona Ridgel who felt amio would be the only medication for her to maintain NSR. She's had severe N/V on this in the past so not a great option with chemo. Had 3 cardioversions already this year. Fortunately converted to NSR last night. Try to convert to oral diltiazem or BB today. AV node ablation and PPM could be a consideration in the future but not a candidate presently with indwelling porta cath. (Failed propafenone and now on dofetilide).   CHF diastolic- BNP 840 yest, basilar rales I/O's +1863-lasix 80 mg IV bid ordered for  today. Takes 80 mg daily at home.   S/P AVR on coumadin  Coarctation of aorta s/p aortic arch repair age 89  Stage 4 follicular lymphoma  COPD  Anemia-Hgb 7.3 this am.     Risk Assessment/Risk Scores:        New York Heart Association (NYHA) Functional Class NYHA Class II  CHA2DS2-VASc Score = 3   This indicates a 3.2% annual risk of stroke. The patient's score is based upon: CHF History: 1 HTN History: 1 Diabetes History: 0 Stroke History: 0 Vascular Disease History: 0 Age Score: 0 Gender Score: 1         For questions or updates, please contact [facility] Please consult [email or web address] for contact  info under    Signed, Jacolyn Reedy, PA-C  11/16/2022 7:49 AM    Attending attestation  Patient seen and independently examined with Herma Carson, PA-C. We discussed all aspects of the encounter. I agree with the assessment and plan as stated above.  Patient is a 62 year old F known to have CoA repair at the age 30, bicuspid aortic valve with aortic aneurysm s/p aortic valve and arch repair in 2016 with maze and LAA clipping (CoA not felt to be severe enough for redo surgery at the same time), persistent L sided SVC, paroxysmal A-fib/flutter, chronic hypoxic respiratory failure on home O2 (chronic lung disease with decreased DLCO), chronic diastolic heart failure, stage IV follicular lymphoma (newly diagnosed) presented with nausea, vomiting and palpitations.  She underwent chemotherapy on Monday, Thursday and injection shot on Friday after which she started to have severe nausea and vomiting on Saturday which prompted her visit to the ER on Sunday/11/15/2022. She presented with atrial fibrillation with RVR and was started on diltiazem drip.  Dofetilide was continued. Echo from 6/24 showed normal LVEF, normal RV function and normal aortic prosthesis.  He spontaneously converted to NSR last night and continue to remain in NSR.  Her nausea also improved.  Physical  examination is remarkable for patient not in acute respite distress, HEENT normal, mildly elevated JVD, S1-S2 normal, clear lungs, abdomen NT and ND, no LE swelling, AAOx3.  For paroxysmal A-fib with RVR, converted to NSR last night, currently on diltiazem drip 5 mg/h which I will wean off and will be overlapped with p.o. diltiazem 180 mg once daily. Continue Tikosyn 250 mcg twice daily. Coumadin on hold for supratherapeutic INR. For acute on chronic diastolic heart failure exacerbation with mildly elevated BNP 840, administer IV Lasix 80 mg twice daily and assess response.  Divon Krabill Verne Spurr, MD [place]  [facility]  11:58 AM

## 2022-11-16 NOTE — Progress Notes (Signed)
Lab called stating patient INR was 8.9. It was taken at 0400 this morning. Doctor already aware.

## 2022-11-17 ENCOUNTER — Inpatient Hospital Stay (HOSPITAL_COMMUNITY): Payer: Medicaid Other

## 2022-11-17 ENCOUNTER — Encounter: Payer: Self-pay | Admitting: Cardiovascular Disease

## 2022-11-17 DIAGNOSIS — I5031 Acute diastolic (congestive) heart failure: Secondary | ICD-10-CM

## 2022-11-17 DIAGNOSIS — R791 Abnormal coagulation profile: Secondary | ICD-10-CM

## 2022-11-17 DIAGNOSIS — I5033 Acute on chronic diastolic (congestive) heart failure: Secondary | ICD-10-CM | POA: Diagnosis not present

## 2022-11-17 DIAGNOSIS — J9611 Chronic respiratory failure with hypoxia: Secondary | ICD-10-CM | POA: Diagnosis not present

## 2022-11-17 DIAGNOSIS — I4891 Unspecified atrial fibrillation: Secondary | ICD-10-CM | POA: Diagnosis not present

## 2022-11-17 LAB — PREPARE RBC (CROSSMATCH)

## 2022-11-17 LAB — ECHOCARDIOGRAM LIMITED
Area-P 1/2: 5.97 cm2
Height: 64 in
MV M vel: 5.47 m/s
MV Peak grad: 119.7 mmHg
S' Lateral: 3.2 cm
Weight: 1756.63 oz

## 2022-11-17 LAB — TYPE AND SCREEN: Unit division: 0

## 2022-11-17 LAB — BASIC METABOLIC PANEL
Anion gap: 10 (ref 5–15)
BUN: 17 mg/dL (ref 6–20)
CO2: 34 mmol/L — ABNORMAL HIGH (ref 22–32)
Calcium: 8.2 mg/dL — ABNORMAL LOW (ref 8.9–10.3)
Chloride: 90 mmol/L — ABNORMAL LOW (ref 98–111)
Creatinine, Ser: 0.87 mg/dL (ref 0.44–1.00)
GFR, Estimated: >60 mL/min (ref >60)
Glucose, Bld: 89 mg/dL (ref 70–99)
Potassium: 4.1 mmol/L (ref 3.5–5.1)
Sodium: 134 mmol/L — ABNORMAL LOW (ref 135–145)

## 2022-11-17 LAB — CBC
HCT: 22.9 % — ABNORMAL LOW (ref 36.0–46.0)
Hemoglobin: 6.8 g/dL — CL (ref 12.0–15.0)
MCH: 24.6 pg — ABNORMAL LOW (ref 26.0–34.0)
MCHC: 29.7 g/dL — ABNORMAL LOW (ref 30.0–36.0)
MCV: 83 fL (ref 80.0–100.0)
Platelets: 188 10*3/uL (ref 150–400)
RBC: 2.76 MIL/uL — ABNORMAL LOW (ref 3.87–5.11)
RDW: 17.4 % — ABNORMAL HIGH (ref 11.5–15.5)
WBC: 16.1 10*3/uL — ABNORMAL HIGH (ref 4.0–10.5)
nRBC: 0 % (ref 0.0–0.2)

## 2022-11-17 LAB — PROTIME-INR
INR: 3.7 — ABNORMAL HIGH (ref 0.8–1.2)
Prothrombin Time: 36.8 seconds — ABNORMAL HIGH (ref 11.4–15.2)

## 2022-11-17 LAB — HEMOGLOBIN AND HEMATOCRIT, BLOOD
HCT: 27.1 % — ABNORMAL LOW (ref 36.0–46.0)
Hemoglobin: 8.3 g/dL — ABNORMAL LOW (ref 12.0–15.0)

## 2022-11-17 LAB — ABO/RH: ABO/RH(D): O NEG

## 2022-11-17 MED ORDER — SODIUM CHLORIDE 0.9% IV SOLUTION: Freq: Once | INTRAVENOUS | Status: AC

## 2022-11-17 MED ORDER — FUROSEMIDE 10 MG/ML IJ SOLN
80.0000 mg | Freq: Two times a day (BID) | INTRAMUSCULAR | Status: AC
  Administered 2022-11-17 (×2): 80 mg via INTRAVENOUS
  Filled 2022-11-17 (×2): qty 8

## 2022-11-17 NOTE — Progress Notes (Signed)
The patient was noted to be lethargic and difficult to arouse.  At this time the patient's bag was noticed to be in the bed with her and upon moving it medication bottles were heard moving around.  Three RN's and the [facility] were present upon opening the patient's bag and two bottles were found: one bottle of oxycodone-acetaminophen 10-325 mg with 96 tablets present and one bottle of Alprazolam 0.5 mg 12.5 tablets present.  These counts were completed by Molly Maduro A. Linde Gillis, RN and Altha Harm, Charity fundraiser.  This medication was then sealed in pharmacy bag and sent with the [facility] to be placed in pharmacy per [facility].  The patient is now more awake and was instructed of [facility] Policy in regards to personal medication and she was agreeable to that they be stored in the pharmacy.

## 2022-11-17 NOTE — Progress Notes (Signed)
  Echocardiogram 2D Echocardiogram has been performed.  Maren Reamer 11/17/2022, 1:21 PM

## 2022-11-17 NOTE — Progress Notes (Signed)
PROGRESS NOTE  Mallory Sutton WUJ:[id number] DOB: 04/09/1961 DOA: 11/15/2022 PCP: Leone Payor, FNP  Brief History:  62 year old female with a history of persistent atrial fibrillation, follicular lymphoma stage IV, pulmonary hypertension, bicuspid aortic valve status post AVR, coarctation of the aorta status post aortic arch repair, and COPD?  Presenting with nausea, vomiting, and palpitations.  The patient has been suffering from intermittent nausea and vomiting from chemotherapy for her follicular lymphoma.  She states that her last chemotherapy/immunotherapy was received on 11/04/2022.  She has been receiving rituximab and Bendamustine under the guidance of Dr. Ellin Saba.  She states that she received an injection of Neulasta on 11/06/2022.  She has had intermittent nausea and vomiting after chemotherapy.  However she has developed intractable vomiting on 11/14/2022.  She began having palpitations.  She has noted that her heart rate has been in the 150s.  She has had some chest discomfort and shortness of breath.  As result she presented for further evaluation and treatment. In the ED, the patient was afebrile and hemodynamically stable.  Oxygen saturation was 100% on 2 L.  WBC 21.7, hemoglobin 8.2, platelets 213,000.  Sodium 131, potassium 3.0, bicarbonate 33, serum creatinine 0.97.  LFTs were unremarkable.  Chest x-ray showed vascular congestion.  INR 6.1.  BNP 840.  Troponin 9.  EKG showed atrial fibrillation with nonspecific ST and T wave changes with a heart rate in 130s.  The patient was started on diltiazem drip.  She was admitted for further evaluation and treatment of her A-fib with RVR as well as intractable nausea and vomiting.   Assessment/Plan:  Persistent atrial fibrillation with RVR -The patient was started on diltiazem drip initially -converted back to sinus in early am 7/15 -pt states she takes metoprolol succinate 75 mg daily>>restart -IV diltiazem>>po diltiazem CD 180 mg  7/15 -continue dofetilide -10/15/2022 echo EF 55-60%, normal RVEF, mild MR, mechanical AVR -Optimize electrolytes -cardiology consult appreciated   Intractable nausea and vomiting -Therapy has been limited secondary to her dofetilide -Use Compazine if possible and necessary -Considering metoclopramide as well for intractable vomiting -Pantoprazole IV twice daily -slowly improving -advance to cardiac diet   Chronic HFpEF -The patient has signs of fluid overload -Furosemide IV was ordered for 7/14 but was not given -dose lasix 80 mg IV x 2 on 7/15, redose on 7/16 -??ReDS vest accuracy 36>>49 -volume status has improved clinically   Leukemoid reaction -This may be from the patient's recent Neulasta injection -Check differential -Check PCT 0.26 -Blood cultures x 2 sets--neg -UA --neg for pyuria -Defer antibiotics presently as the patient is hemodynamically stable and afebrile -Monitor clinically -WBC improving   Chronic respiratory failure with hypoxia -Patient is chronically on 2 L nasal cannula -Stable presently on 2 L   Mechanical aortic valve--status post AVR for bicuspid aortic valve -INR is supratherapeutic -INR 8.9>>3.7 -no signs of active bleed -allow INR to drift down -Pharmacy to assist with dosing of warfarin   Supratherapeutic INR -Since the patient's hemoglobin is stable with no active bleeding, will allow the patient's INR to drift downward without reversing warfarin at this time -INR 8.9>>3.7 -no signs of active bleed -allow INR to drift down  Acute anemia -Hgb drifted down to 6.8 -baseline Hgb 8-9 -7/16--transfuse one unit PRBC -no sign of active bleed   Stage IV follicular lymphoma -outpatient follow-up with Dr. Ellin Saba -Last dose of chemotherapy with rituximab and Bendamustine 11/04/2022.   Opioid dependence -PDMP reviewed -Percocet  10/325, #60, last refill 09/30/2022 -Alprazolam 0.5 mg, #90, last refill 10/05/2022   History vertebral  compression fracture -Continue Percocet as discussed above   Hypokalemia -Replete -Check mag 2.1   Depression/anxiety -Continue Effexor and alprazolam as discussed   Port-a-cath in place -local care           Family Communication:   daughter updated 7/16   Consultants:  cardiology   Code Status:  FULL    DVT Prophylaxis:  warfarin     Procedures: As Listed in Progress Note Above   Antibiotics: None          Subjective: She is breathing better, but still has some dyspnea on exertion. Patient denies fevers, chills, headache, chest pain, dyspnea, nausea, vomiting, diarrhea, abdominal pain, dysuria, hematuria, hematochezia, and melena.   Objective: Vitals:   11/17/22 1141 11/17/22 1200 11/17/22 1300 11/17/22 1330  BP: (!) 151/74 126/77 (!) 110/53 135/68  Pulse: (!) 104 97 94 (!) 104  Resp: 18 (!) 23 (!) 23 (!) 24  Temp: 98.2 F (36.8 C)   98.4 F (36.9 C)  TempSrc: Oral   Oral  SpO2: 100% 100% 99% 97%  Weight:      Height:        Intake/Output Summary (Last 24 hours) at 11/17/2022 1459 Last data filed at 11/17/2022 1345 Gross per 24 hour  Intake 1213.4 ml  Output 2150 ml  Net -936.6 ml   Weight change: 1.119 kg Exam:  General:  Pt is alert, follows commands appropriately, not in acute distress HEENT: No icterus, No thrush, No neck mass, [place]/AT Cardiovascular: RRR, S1/S2, no rubs, no gallops Respiratory: bibasilar crackles. No wheeze Abdomen: Soft/+BS, non tender, non distended, no guarding Extremities: trace LE edema, No lymphangitis, No petechiae, No rashes, no synovitis   Data Reviewed: I have personally reviewed following labs and imaging studies Basic Metabolic Panel: Recent Labs  Lab 11/15/22 1138 11/16/22 0415 11/17/22 0504  NA 131* 131* 134*  K 3.0* 3.7 4.1  CL 84* 89* 90*  CO2 33* 31 34*  GLUCOSE 124* 128* 89  BUN 21* 18 17  CREATININE 0.97 0.88 0.87  CALCIUM 9.1 8.5* 8.2*  MG  --  2.1  --    Liver Function Tests: Recent  Labs  Lab 11/15/22 1138  AST 26  ALT 22  ALKPHOS 193*  BILITOT 1.1  PROT 6.3*  ALBUMIN 3.6   No results for input(s): "LIPASE", "AMYLASE" in the last 168 hours. No results for input(s): "AMMONIA" in the last 168 hours. Coagulation Profile: Recent Labs  Lab 11/12/22 1552 11/15/22 1138 11/16/22 0415 11/17/22 0504  INR 8.0* 6.1* 8.9* 3.7*   CBC: Recent Labs  Lab 11/15/22 1138 11/16/22 0415 11/17/22 0504  WBC 21.7* 38.4* 16.1*  HGB 8.2* 7.3* 6.8*  HCT 27.1* 24.1* 22.9*  MCV 79.5* 80.3 83.0  PLT 313 261 188   Cardiac Enzymes: No results for input(s): "CKTOTAL", "CKMB", "CKMBINDEX", "TROPONINI" in the last 168 hours. BNP: Invalid input(s): "POCBNP" CBG: No results for input(s): "GLUCAP" in the last 168 hours. HbA1C: No results for input(s): "HGBA1C" in the last 72 hours. Urine analysis:    Component Value Date/Time   COLORURINE YELLOW 11/16/2022 0825   APPEARANCEUR CLEAR 11/16/2022 0825   LABSPEC 1.012 11/16/2022 0825   PHURINE 6.0 11/16/2022 0825   GLUCOSEU NEGATIVE 11/16/2022 0825   HGBUR NEGATIVE 11/16/2022 0825   BILIRUBINUR NEGATIVE 11/16/2022 0825   KETONESUR NEGATIVE 11/16/2022 0825   PROTEINUR 30 (A) 11/16/2022 0825   NITRITE  NEGATIVE 11/16/2022 0825   LEUKOCYTESUR NEGATIVE 11/16/2022 0825   Sepsis Labs: @LABRCNTIP (procalcitonin:4,lacticidven:4) ) Recent Results (from the past 240 hour(s))  MRSA Next Gen by PCR, Nasal     Status: None   Collection Time: 11/15/22  2:36 PM   Specimen: Nasal Mucosa; Nasal Swab  Result Value Ref Range Status   MRSA by PCR Next Gen NOT DETECTED NOT DETECTED Final    Comment: (NOTE) The GeneXpert MRSA Assay (FDA approved for NASAL specimens only), is one component of a comprehensive MRSA colonization surveillance program. It is not intended to diagnose MRSA infection nor to guide or monitor treatment for MRSA infections. Test performance is not FDA approved in patients less than 68 years old. Performed at [facility], [address], [place], [place] [address]   Culture, blood (Routine X 2) w Reflex to ID Panel     Status: None (Preliminary result)   Collection Time: 11/16/22  7:55 AM   Specimen: BLOOD  Result Value Ref Range Status   Specimen Description BLOOD BLOOD RIGHT WRIST  Final   Special Requests   Final    BOTTLES DRAWN AEROBIC AND ANAEROBIC Blood Culture adequate volume   Culture   Final    NO GROWTH 1 DAY Performed at [facility], [address], [place], [place] [address]    Report Status PENDING  Incomplete  Culture, blood (Routine X 2) w Reflex to ID Panel     Status: None (Preliminary result)   Collection Time: 11/16/22  7:56 AM   Specimen: BLOOD  Result Value Ref Range Status   Specimen Description BLOOD LEFT HAND  Final   Special Requests   Final    BOTTLES DRAWN AEROBIC ONLY Blood Culture adequate volume   Culture   Final    NO GROWTH 1 DAY Performed at [facility], [address], [place], [place] [address]    Report Status PENDING  Incomplete     Scheduled Meds:  allopurinol  300 mg Oral Daily   ALPRAZolam  0.5 mg Oral TID   Chlorhexidine Gluconate Cloth  6 each Topical Q0600   diltiazem  180 mg Oral Daily   dofetilide  250 mcg Oral BID   erythromycin   Left Eye QHS   feeding supplement  237 mL Oral BID BM   furosemide  80 mg Intravenous BID   gabapentin  300 mg Oral BID   gabapentin  600 mg Oral QHS   magnesium oxide  400 mg Oral Daily   mometasone-formoterol  2 puff Inhalation BID   olopatadine  1 drop Left Eye BID   pantoprazole (PROTONIX) IV  40 mg Intravenous Q12H   potassium chloride  20 mEq Oral BID   scopolamine  1 patch Transdermal Q72H   sodium chloride flush  3 mL Intravenous Q12H   umeclidinium bromide  1 puff Inhalation Daily   venlafaxine  75 mg Oral BID   Continuous Infusions:  sodium chloride      Procedures/Studies: DG Chest Port 1 View  Result Date: 11/17/2022 CLINICAL DATA:  Congestive heart failure EXAM: PORTABLE CHEST 1 VIEW  COMPARISON:  11/15/2022 FINDINGS: Power port remains in place with the tip in the SVC RA junction region. Previous median sternotomy. Previous aortic valve replacement and atrial clip. Worsening of volume loss in both lower lobes since the study of 2 days ago. Probable small bilateral pleural effusions. IMPRESSION: Worsening of volume loss in both lower lobes since the study of 2 days ago. Probable small bilateral  pleural effusions. Electronically Signed   By: Paulina Fusi M.D.   On: 11/17/2022 14:55   DG Chest Port 1 View  Result Date: 11/15/2022 CLINICAL DATA:  Atrial fibrillation. EXAM: PORTABLE CHEST 1 VIEW COMPARISON:  October 14, 2022 FINDINGS: Stable support apparatus. Stable postsurgical changes. Enlarged cardiac silhouette. Minimal prominence of the interstitium. IMPRESSION: 1. Stable support apparatus. 2. Minimal prominence of the interstitium may represent pulmonary vascular congestion. 3. Enlarged cardiac silhouette. Electronically Signed   By: Ted Mcalpine M.D.   On: 11/15/2022 12:11    Catarina Hartshorn, DO  [facility]  If 7PM-7AM, please contact night-coverage [email or web address] Password [email or web address] 11/17/2022, 2:59 PM   LOS: 2 days

## 2022-11-17 NOTE — Progress Notes (Signed)
   11/17/22 1000  ReDS Vest / Clip  BMI (Calculated) 18.84  Station Marker B  Ruler Value 33  ReDS Value Range (!) > 40  ReDS Actual Value 49

## 2022-11-17 NOTE — Progress Notes (Signed)
Pt lunch arrived, during pt care. Will attempt later.

## 2022-11-17 NOTE — Plan of Care (Signed)
  Problem: Education: Goal: Knowledge of General Education information will improve Description: Including pain rating scale, medication(s)/side effects and non-pharmacologic comfort measures Outcome: Progressing   Problem: Health Behavior/Discharge Planning: Goal: Ability to manage health-related needs will improve Outcome: Progressing   Problem: Clinical Measurements: Goal: Ability to maintain clinical measurements within normal limits will improve Outcome: Progressing   Problem: Clinical Measurements: Goal: Will remain free from infection Outcome: Progressing   Problem: Clinical Measurements: Goal: Diagnostic test results will improve Outcome: Progressing   Problem: Clinical Measurements: Goal: Respiratory complications will improve Outcome: Progressing   Problem: Clinical Measurements: Goal: Cardiovascular complication will be avoided Outcome: Progressing   Problem: Activity: Goal: Risk for activity intolerance will decrease Outcome: Progressing   Problem: Nutrition: Goal: Adequate nutrition will be maintained Outcome: Progressing   Problem: Coping: Goal: Level of anxiety will decrease Outcome: Progressing   Problem: Elimination: Goal: Will not experience complications related to bowel motility Outcome: Progressing   Problem: Elimination: Goal: Will not experience complications related to urinary retention Outcome: Progressing   Problem: Pain Managment: Goal: General experience of comfort will improve Outcome: Progressing   Problem: Safety: Goal: Ability to remain free from injury will improve Outcome: Progressing   Problem: Skin Integrity: Goal: Risk for impaired skin integrity will decrease Outcome: Progressing   Problem: Education: Goal: Ability to demonstrate management of disease process will improve Outcome: Progressing   Problem: Education: Goal: Ability to verbalize understanding of medication therapies will improve Outcome: Progressing     Problem: Activity: Goal: Capacity to carry out activities will improve Outcome: Progressing

## 2022-11-17 NOTE — Progress Notes (Signed)
ANTICOAGULATION CONSULT NOTE -   Pharmacy Consult for Coumadin Indication: Afib s/p AVR  Allergies  Allergen Reactions   Amiodarone Nausea Only   Penicillins Hives    Has patient had a PCN reaction causing immediate rash, facial/tongue/throat swelling, SOB or lightheadedness with hypotension: YES Has patient had a PCN reaction causing severe rash involving mucus membranes or skin necrosis: NO Has patient had a PCN reaction that required hospitalization NO Has patient had a PCN reaction occurring within the last 10 years: NO If all of the above answers are "NO", then may proceed with Cephalosporin use.     Vital Signs: Temp: 98.2 F (36.8 C) (07/16 0746) Temp Source: Oral (07/16 0746) BP: 96/40 (07/16 0500) Pulse Rate: 70 (07/16 0500)  Labs: Recent Labs    11/15/22 1138 11/16/22 0415 11/17/22 0504  HGB 8.2* 7.3*  --   HCT 27.1* 24.1*  --   PLT 313 261  --   LABPROT 54.1* 73.1* 36.8*  INR 6.1* 8.9* 3.7*  CREATININE 0.97 0.88 0.87  TROPONINIHS 9  --   --     Estimated Creatinine Clearance: 54.1 mL/min (by C-G formula based on SCr of 0.87 mg/dL).  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 Coumadin has been on hold since 7/11 yet INR still supratherapeutic likely a result of decreased po d/t ongoing chemo-related nausea/vomiting.   Usual dosage: 4 mg on Tue and 3 mg ROW  INR 6.1 > 8.9> 3.7 Hgb 7.3- monitor  Goal of Therapy:  INR 2-3 Monitor platelets by anticoagulation protocol: Yes   Plan:  Continue to hold coumadin INR daily  Judeth Cornfield, PharmD Clinical Pharmacist 11/17/2022 7:53 AM

## 2022-11-17 NOTE — Progress Notes (Signed)
CRITICAL VALUE STICKER  CRITICAL VALUE: Hemoglobin 6.8  RECEIVER (on-site recipient of call): Lockie Mola, RN  MD NOTIFIED: Dr. Arbutus Leas  TIME OF NOTIFICATION: 08:13AM  RESPONSE:  awaiting

## 2022-11-17 NOTE — Progress Notes (Addendum)
Patient Name: Mallory Sutton Date of Encounter: 11/17/2022 [facility] Cardiologist: Mallory Haws, MD    Interval Summary  .    BP low awaiting transfusion, no bleeding, melena. Still with ankle edema  Vital Signs .    Vitals:   11/17/22 0500 11/17/22 0538 11/17/22 0746 11/17/22 0816  BP: (!) 96/40     Pulse: 70     Resp: 12     Temp:  98.2 F (36.8 C) 98.2 F (36.8 C)   TempSrc:  Oral Oral   SpO2: 100%   97%  Weight: 49.8 kg     Height:        Intake/Output Summary (Last 24 hours) at 11/17/2022 0844 Last data filed at 11/17/2022 0809 Gross per 24 hour  Intake 995.84 ml  Output 400 ml  Net 595.84 ml      11/17/2022    5:00 AM 11/16/2022    8:00 AM 11/16/2022    5:00 AM  Last 3 Weights  Weight (lbs) 109 lb 12.6 oz 108 lb 7.5 oz 108 lb 7.5 oz  Weight (kg) 49.8 kg 49.2 kg 49.2 kg      Telemetry/ECG    NSR - Personally Reviewed  Physical Exam .   GEN: No acute distress.   Neck: increase JVD Cardiac:  RRR,4/6 systolic murmur LSB Respiratory: basilar rales bilaterally. GI: Soft, nontender, non-distended  MS: trace ankle  edema  Assessment & Plan .    Afib/flutter with RVR uncontrolled since on chemo. Recently saw Dr. Ladona Sutton who felt amio would be the only medication for her to maintain NSR. She's had severe N/V on this in the past so not a great option with chemo. Had 3 cardioversions already this year. Fortunately converted to NSR now on oral diltiazem 180 mg. AV node ablation and PPM could be a consideration in the future but not a candidate presently with indwelling porta cath. (Failed propafenone and now on dofetilide).    CHF diastolic- BNP 840   basilar rales I/O's +2459 despite-lasix 80 mg IV bidyesterday.  Takes 80 mg daily at home. Getting PRBC's so will need extra lasix today. Will order IV BID again today in hopes of diuresis.    S/P AVR on coumadin-on hold-supratherapeutic at 8.9 yest, 3.7 today now Hgb 6.8   Coarctation of aorta s/p aortic  arch repair age 61   Stage 4 follicular lymphoma   COPD   Anemia-Hgb 6.8  this am-receiving 1 unit PRBC's   For questions or updates, please contact [facility] Please consult [email or web address] for contact info under        Signed, Mallory Reedy, PA-C    Attending attestation  No acute events overnight, continues to be in NSR on telemetry.  She is doing great, has some SOB. She received IV Lasix 80 mg twice daily yesterday with 400 mL urine output with no AKI.  Reds vest actual value is 49 this a.m. unclear if this is accurate.  Physical examination is remarkable patient not in acute respite distress, HEENT normal, mild JVD, systolic murmur present, normal/loud sounds, minimal Rales, abdomen NT and ND, no pitting edema to bilateral lower extremities. Hemoglobin dropped to 6.8 this a.m. but INR has normalized to 3.7. No active bleeding.  She is getting PRBC transfusion today. Okay to administer IV Lasix 80 mg 1 dose, will obtain chest x-ray and limited echocardiogram for CVP check and diastology.  For paroxysmal A-fib, continues to be in NSR, continue p.o. diltiazem 180 mg  once daily, Tikosyn to 50 mcg twice daily and Coumadin is on hold for supratherapeutic INR which is normalized now.  Continue to hold Coumadin for now.   Mallory Rembold Verne Spurr, MD [place]  [facility]  11:07 AM

## 2022-11-18 DIAGNOSIS — I4891 Unspecified atrial fibrillation: Secondary | ICD-10-CM | POA: Diagnosis not present

## 2022-11-18 DIAGNOSIS — Z95828 Presence of other vascular implants and grafts: Secondary | ICD-10-CM | POA: Diagnosis not present

## 2022-11-18 DIAGNOSIS — Z7901 Long term (current) use of anticoagulants: Secondary | ICD-10-CM | POA: Diagnosis not present

## 2022-11-18 DIAGNOSIS — C8208 Follicular lymphoma grade I, lymph nodes of multiple sites: Secondary | ICD-10-CM

## 2022-11-18 DIAGNOSIS — R112 Nausea with vomiting, unspecified: Secondary | ICD-10-CM

## 2022-11-18 DIAGNOSIS — R791 Abnormal coagulation profile: Secondary | ICD-10-CM | POA: Diagnosis not present

## 2022-11-18 DIAGNOSIS — I5033 Acute on chronic diastolic (congestive) heart failure: Secondary | ICD-10-CM | POA: Diagnosis not present

## 2022-11-18 LAB — BASIC METABOLIC PANEL
Anion gap: 7 (ref 5–15)
BUN: 14 mg/dL (ref 6–20)
CO2: 37 mmol/L — ABNORMAL HIGH (ref 22–32)
Calcium: 8.1 mg/dL — ABNORMAL LOW (ref 8.9–10.3)
Chloride: 89 mmol/L — ABNORMAL LOW (ref 98–111)
Creatinine, Ser: 0.76 mg/dL (ref 0.44–1.00)
GFR, Estimated: >60 mL/min (ref >60)
Glucose, Bld: 90 mg/dL (ref 70–99)
Potassium: 3 mmol/L — ABNORMAL LOW (ref 3.5–5.1)
Sodium: 133 mmol/L — ABNORMAL LOW (ref 135–145)

## 2022-11-18 LAB — TYPE AND SCREEN
ABO/RH(D): O NEG
Antibody Screen: NEGATIVE

## 2022-11-18 LAB — CBC
HCT: 29.3 % — ABNORMAL LOW (ref 36.0–46.0)
Hemoglobin: 8.7 g/dL — ABNORMAL LOW (ref 12.0–15.0)
MCH: 24.3 pg — ABNORMAL LOW (ref 26.0–34.0)
MCHC: 29.7 g/dL — ABNORMAL LOW (ref 30.0–36.0)
MCV: 81.8 fL (ref 80.0–100.0)
Platelets: 193 10*3/uL (ref 150–400)
RBC: 3.58 MIL/uL — ABNORMAL LOW (ref 3.87–5.11)
RDW: 16.5 % — ABNORMAL HIGH (ref 11.5–15.5)
WBC: 9.3 10*3/uL (ref 4.0–10.5)
nRBC: 0 % (ref 0.0–0.2)

## 2022-11-18 LAB — BPAM RBC
Blood Product Expiration Date: 202408212359
ISSUE DATE / TIME: 202407161117
Unit Type and Rh: 9500

## 2022-11-18 LAB — PROTIME-INR
INR: 1.5 — ABNORMAL HIGH (ref 0.8–1.2)
Prothrombin Time: 17.9 seconds — ABNORMAL HIGH (ref 11.4–15.2)

## 2022-11-18 LAB — MAGNESIUM: Magnesium: 1.9 mg/dL (ref 1.7–2.4)

## 2022-11-18 MED ORDER — WARFARIN - PHARMACIST DOSING INPATIENT: Freq: Every day | Status: DC

## 2022-11-18 MED ORDER — WARFARIN SODIUM 2 MG PO TABS
4.0000 mg | ORAL_TABLET | Freq: Once | ORAL | Status: AC
  Administered 2022-11-18: 4 mg via ORAL
  Filled 2022-11-18: qty 2

## 2022-11-18 MED ORDER — POTASSIUM CHLORIDE CRYS ER 20 MEQ PO TBCR
40.0000 meq | EXTENDED_RELEASE_TABLET | Freq: Two times a day (BID) | ORAL | Status: DC
  Administered 2022-11-18 – 2022-11-21 (×6): 40 meq via ORAL
  Filled 2022-11-18 (×6): qty 2

## 2022-11-18 MED ORDER — POTASSIUM CHLORIDE CRYS ER 20 MEQ PO TBCR
40.0000 meq | EXTENDED_RELEASE_TABLET | Freq: Once | ORAL | Status: AC
  Administered 2022-11-18: 40 meq via ORAL
  Filled 2022-11-18: qty 2

## 2022-11-18 NOTE — Plan of Care (Signed)
   Problem: Activity: Goal: Capacity to carry out activities will improve Outcome: Completed/Met   Problem: Cardiac: Goal: Ability to achieve and maintain adequate cardiopulmonary perfusion will improve Outcome: Completed/Met

## 2022-11-18 NOTE — Plan of Care (Signed)

## 2022-11-18 NOTE — Progress Notes (Signed)
Progress Note  Patient Name: Mallory Sutton Date of Encounter: 11/18/2022  Primary Cardiologist: Charlton Haws, MD  Subjective   Limited echo on 11/17/2022 showed normal LVEF, indeterminate diastology, normal aortic valve prosthesis and CVP 15 mmHg.  Reds vest showed 49 yesterday a.m. 2.5L urine output in last 24 hours on IV Lasix 80 mg twice daily.  INR dropped to 1.5 last night after which Coumadin was restarted.  Overall doing great.  Telemetry reviewed, NSR.   Inpatient Medications    Scheduled Meds:  allopurinol  300 mg Oral Daily   ALPRAZolam  0.5 mg Oral TID   Chlorhexidine Gluconate Cloth  6 each Topical Q0600   diltiazem  180 mg Oral Daily   dofetilide  250 mcg Oral BID   erythromycin   Left Eye QHS   feeding supplement  237 mL Oral BID BM   gabapentin  300 mg Oral BID   gabapentin  600 mg Oral QHS   magnesium oxide  400 mg Oral Daily   mometasone-formoterol  2 puff Inhalation BID   olopatadine  1 drop Left Eye BID   pantoprazole (PROTONIX) IV  40 mg Intravenous Q12H   potassium chloride  40 mEq Oral BID   scopolamine  1 patch Transdermal Q72H   sodium chloride flush  3 mL Intravenous Q12H   umeclidinium bromide  1 puff Inhalation Daily   venlafaxine  75 mg Oral BID   warfarin  4 mg Oral ONCE-1600   Warfarin - Pharmacist Dosing Inpatient   Does not apply q1600   Continuous Infusions:  sodium chloride     PRN Meds: sodium chloride, acetaminophen, ipratropium-albuterol, oxyCODONE-acetaminophen **AND** oxyCODONE, prochlorperazine, sodium chloride flush   Vital Signs    Vitals:   11/18/22 0800 11/18/22 0831 11/18/22 0900 11/18/22 1000  BP: 120/69 118/60 (!) 108/47 (!) 122/47  Pulse: 88 100 86 85  Resp: 17 18 14 19   Temp:      TempSrc:      SpO2: 100% 99% 100% 100%  Weight:      Height:        Intake/Output Summary (Last 24 hours) at 11/18/2022 1014 Last data filed at 11/18/2022 0510 Gross per 24 hour  Intake 522.67 ml  Output 2200 ml  Net -1677.33 ml    Filed Weights   11/17/22 0500 11/17/22 1000 11/18/22 0519  Weight: 49.8 kg 49.8 kg 46.6 kg    Telemetry     Personally reviewed, NSR  ECG    Not performed today  Physical Exam   GEN: No acute distress.   Neck: JVD not examined Cardiac: RRR, no murmur, rub, or gallop.  Respiratory: Nonlabored.  Bibasilar Rales GI: Soft, nontender, bowel sounds present. MS: No edema; No deformity. Neuro:  Nonfocal. Psych: Alert and oriented x 3. Normal affect.  Labs    Chemistry Recent Labs  Lab 11/15/22 1138 11/16/22 0415 11/17/22 0504 11/18/22 0506  NA 131* 131* 134* 133*  K 3.0* 3.7 4.1 3.0*  CL 84* 89* 90* 89*  CO2 33* 31 34* 37*  GLUCOSE 124* 128* 89 90  BUN 21* 18 17 14   CREATININE 0.97 0.88 0.87 0.76  CALCIUM 9.1 8.5* 8.2* 8.1*  PROT 6.3*  --   --   --   ALBUMIN 3.6  --   --   --   AST 26  --   --   --   ALT 22  --   --   --   ALKPHOS 193*  --   --   --  BILITOT 1.1  --   --   --   GFRNONAA >60 >60 >60 >60  ANIONGAP 14 11 10 7      Hematology Recent Labs  Lab 11/16/22 0415 11/17/22 0504 11/17/22 1620 11/18/22 0506  WBC 38.4* 16.1*  --  9.3  RBC 3.00* 2.76*  --  3.58*  HGB 7.3* 6.8* 8.3* 8.7*  HCT 24.1* 22.9* 27.1* 29.3*  MCV 80.3 83.0  --  81.8  MCH 24.3* 24.6*  --  24.3*  MCHC 30.3 29.7*  --  29.7*  RDW 17.5* 17.4*  --  16.5*  PLT 261 188  --  193    Cardiac Enzymes Recent Labs  Lab 11/15/22 1138  TROPONINIHS 9    BNP Recent Labs  Lab 11/15/22 1138  BNP 840.0*     DDimerNo results for input(s): "DDIMER" in the last 168 hours.   Radiology    ECHOCARDIOGRAM LIMITED  Result Date: 11/17/2022    ECHOCARDIOGRAM LIMITED REPORT   Patient Name:   Mallory Sutton Date of Exam: 11/17/2022 Medical Rec #:  [id number]    Height:       64.0 in Accession #:    [id number]   Weight:       109.8 lb Date of Birth:  1960-07-12   BSA:          1.516 m Patient Age:    62 years     BP:           153/77 mmHg Patient Gender: F            HR:           97 bpm. Exam  [facility] Procedure: Limited Echo, Cardiac Doppler and Limited Color Doppler Indications:    CHF-Acute Diastolic I50.31  History:        Patient has prior history of Echocardiogram examinations, most                 recent 10/15/2022. CHF, Aortic Valve Disease, Arrythmias:Atrial                 Fibrillation, Signs/Symptoms:Murmur; Risk Factors:Non-Smoker.                 Aortic Valve: 19 mm On X mechanical valve is present in the                 aortic position.  Sonographer:    Aron Baba Referring Phys: [id number] Jazzmyn Filion P Alysse Rathe  Sonographer Comments: Image acquisition challenging due to respiratory motion and Image acquisition challenging due to patient body habitus. IMPRESSIONS  1. Limited study.  2. Left ventricular ejection fraction, by estimation, is 55 to 60%. The left ventricle has normal function. The left ventricle has no regional wall motion abnormalities. Left ventricular diastolic parameters are indeterminate.  3. Right ventricular systolic function is mildly reduced. The right ventricular size is not well visualized. There is severely elevated pulmonary artery systolic pressure. The estimated right ventricular systolic pressure is 61.8 mmHg.  4. The mitral valve is abnormal. Moderate mitral valve regurgitation.  5. Tricuspid valve regurgitation is moderate.  6. The aortic valve has been repaired/replaced. Aortic valve regurgitation is mild. There is a 19 mm On X mechanical valve present in the aortic position.  7. The inferior vena cava is dilated in size with <50% respiratory variability, suggesting right atrial pressure of 15 mmHg. Comparison(s): Prior images reviewed side by side. LVEF normal range at 55-60%. Moderate mitral and tricuspid regurgitation. Severely  elevated RVSP. AVR not fully interrogated. Mild aortic regurgitation. FINDINGS  Left Ventricle: Left ventricular ejection fraction, by estimation, is 55 to 60%. The left ventricle has normal function. The left ventricle has  no regional wall motion abnormalities. The left ventricular internal cavity size was normal in size. There is  no left ventricular hypertrophy. Left ventricular diastolic parameters are indeterminate. Left ventricular diastolic function could not be evaluated due to atrial fibrillation. Right Ventricle: The right ventricular size is not well visualized. Right ventricular systolic function is mildly reduced. There is severely elevated pulmonary artery systolic pressure. The tricuspid regurgitant velocity is 3.42 m/s, and with an assumed right atrial pressure of 15 mmHg, the estimated right ventricular systolic pressure is 61.8 mmHg. Left Atrium: Left atrial size was not well visualized. Right Atrium: Right atrial size was not well visualized. Pericardium: There is no evidence of pericardial effusion. Mitral Valve: The mitral valve is abnormal. Mild to moderate mitral annular calcification. Moderate mitral valve regurgitation. MV peak gradient, 21.3 mmHg. The mean mitral valve gradient is 8.0 mmHg. Tricuspid Valve: Tricuspid valve regurgitation is moderate. Aortic Valve: The aortic valve has been repaired/replaced. Aortic valve regurgitation is mild. There is a 19 mm On X mechanical valve present in the aortic position. Venous: The inferior vena cava is dilated in size with less than 50% respiratory variability, suggesting right atrial pressure of 15 mmHg. Additional Comments: Spectral Doppler performed. Color Doppler performed.  LEFT VENTRICLE PLAX 2D LVIDd:         4.40 cm LVIDs:         3.20 cm LV PW:         0.90 cm LV IVS:        0.90 cm  LEFT ATRIUM         Index LA diam:    4.10 cm 2.70 cm/m  MITRAL VALVE                TRICUSPID VALVE MV Area (PHT): 5.97 cm     TR Peak grad:   46.8 mmHg MV Peak grad:  21.3 mmHg    TR Vmax:        342.00 cm/s MV Mean grad:  8.0 mmHg MV Vmax:       2.31 m/s MV Vmean:      125.0 cm/s MV Decel Time: 127 msec MR Peak grad: 119.7 mmHg MR Mean grad: 79.0 mmHg MR Vmax:      547.00  cm/s MR Vmean:     428.0 cm/s MV E velocity: 231.00 cm/s MV A velocity: 63.40 cm/s MV E/A ratio:  3.64 Nona Dell MD Electronically signed by Nona Dell MD Signature Date/Time: 11/17/2022/3:17:47 PM    Final    DG Chest Port 1 View  Result Date: 11/17/2022 CLINICAL DATA:  Congestive heart failure EXAM: PORTABLE CHEST 1 VIEW COMPARISON:  11/15/2022 FINDINGS: Power port remains in place with the tip in the SVC RA junction region. Previous median sternotomy. Previous aortic valve replacement and atrial clip. Worsening of volume loss in both lower lobes since the study of 2 days ago. Probable small bilateral pleural effusions. IMPRESSION: Worsening of volume loss in both lower lobes since the study of 2 days ago. Probable small bilateral pleural effusions. Electronically Signed   By: Paulina Fusi M.D.   On: 11/17/2022 14:55     Assessment & Plan   # Acute on chronic HFpEF -Limited echo on 11/17/2022 showed normal LVEF, indeterminate diastology, normal aortic valve prosthesis and CVP 15 mmHg.  Reds vest showed 49 yesterday a.m.  On IV Lasix 80 mg twice daily to 2.5L urine output and net -1.6 L in the last 24 hours.  Continue the same diuretic regimen, IV Lasix 80 mg twice daily.  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  Coumadin was on hold due to supratherapeutic INR however last night INR dropped to 1.5 after which Coumadin was restarted.  # Cortication of aorta repair at age 88, aortic arch repair in 2016, stage IV follicular lymphoma, COPD: Management per primary team.   Signed, Marjo Bicker, MD  11/18/2022, 10:14 AM

## 2022-11-18 NOTE — Progress Notes (Signed)
   11/18/22 0800  ReDS Vest / Clip  Station Marker B  Ruler Value 33  ReDS Value Range < 36  ReDS Actual Value 30

## 2022-11-18 NOTE — Progress Notes (Signed)
PROGRESS NOTE  Mallory Sutton YNW:[id number] DOB: 10/01/1960 DOA: 11/15/2022 PCP: Leone Payor, FNP  Brief History:  62 year old female with a history of persistent atrial fibrillation, follicular lymphoma stage IV, pulmonary hypertension, bicuspid aortic valve status post AVR, coarctation of the aorta status post aortic arch repair, and COPD?  Presenting with nausea, vomiting, and palpitations.  The patient has been suffering from intermittent nausea and vomiting from chemotherapy for her follicular lymphoma.  She states that her last chemotherapy/immunotherapy was received on 11/04/2022.  She has been receiving rituximab and Bendamustine under the guidance of Dr. Ellin Saba.  She states that she received an injection of Neulasta on 11/06/2022.  She has had intermittent nausea and vomiting after chemotherapy.  However she has developed intractable vomiting on 11/14/2022.  She began having palpitations.  She has noted that her heart rate has been in the 150s.  She has had some chest discomfort and shortness of breath.  As result she presented for further evaluation and treatment. In the ED, the patient was afebrile and hemodynamically stable.  Oxygen saturation was 100% on 2 L.  WBC 21.7, hemoglobin 8.2, platelets 213,000.  Sodium 131, potassium 3.0, bicarbonate 33, serum creatinine 0.97.  LFTs were unremarkable.  Chest x-ray showed vascular congestion.  INR 6.1.  BNP 840.  Troponin 9.  EKG showed atrial fibrillation with nonspecific ST and T wave changes with a heart rate in 130s.  The patient was started on diltiazem drip.  She was admitted for further evaluation and treatment of her A-fib with RVR as well as intractable nausea and vomiting.   Assessment/Plan: Persistent atrial fibrillation with RVR -The patient was started on diltiazem drip initially -converted back to sinus in early am 7/15 -pt states she takes metoprolol succinate 75 mg daily>>restart -IV diltiazem>>po diltiazem CD 180 mg  7/15 -continue dofetilide -10/15/2022 echo EF 55-60%, normal RVEF, mild MR, mechanical AVR -Optimize electrolytes -cardiology consult appreciated   Intractable nausea and vomiting -Therapy has been limited secondary to her dofetilide -Use Compazine if possible and necessary -Considering metoclopramide as well for intractable vomiting -Pantoprazole IV twice daily -Improving so far tolerating diet. -advance to cardiac diet -Hopefully back home in the next 24-48 hours.    Chronic HFpEF -The patient has signs of fluid overload -Continue Lasix twice a day -??ReDS vest accuracy 36>>49>>30 -volume status has improved clinically; reporting just mild orthopnea today.   Leukemoid reaction -This may be from the patient's recent Neulasta injection -Check differential -Check PCT 0.26 -Blood cultures x 2 sets--neg -UA --neg for pyuria -Defer antibiotics presently as the patient is hemodynamically stable and afebrile -Monitor clinically -WBC improving; no fever.   Chronic respiratory failure with hypoxia -Patient is chronically on 2 L nasal cannula -Stable presently on 2 L   Mechanical aortic valve--status post AVR for bicuspid aortic valve -INR is supratherapeutic -INR 8.9>>3.7>>1.5 -no signs of active bleed -allow INR to drift down -Pharmacy to assist with dosing of warfarin   Supratherapeutic INR -Since the patient's hemoglobin is stable with no active bleeding, will allow the patient's INR to drift downward without reversing warfarin at this time -INR 8.9>>3.7 -no signs of active bleed -Down to 1.5 today; pharmacy assisting with dosage to accomplish therapeutic level.  Acute anemia -Hgb drifted down to 6.8 -baseline Hgb 8-9 -After transfusion of 1 unit PRBCs on 11/17/2022, patient's hemoglobin 8.7 -Continue to follow trend.   Stage IV follicular lymphoma -Last dose of chemotherapy with rituximab and  Bendamustine 11/04/2022. -Continue patient follow-up with Dr. Ellin Saba  (oncology service.   Opioid dependence -PDMP reviewed -Percocet 10/325, #60, last refill 09/30/2022 -Alprazolam 0.5 mg, #90, last refill 10/05/2022 -No planning for controlled substance prescriptions at discharge; patient under contract.   History vertebral compression fracture -Continue Percocet as discussed above -Planning for outpatient vertebroplasty.   Hypokalemia/hypomagnesemia -Continue to follow electrolytes and replete as needed for a goal of magnesium above 2 and potassium above 4   Depression/anxiety -Continue Effexor and alprazolam as previously prescribed -Stable mood.   Port-a-cath in place -Continue local care           Family Communication:   daughter updated 7/16   Consultants:  cardiology   Code Status:  FULL    DVT Prophylaxis:  warfarin     Procedures: As Listed in Progress Note Above   Antibiotics: None   Subjective: No overt bleeding reported; denies chest pain, no nausea or vomiting.  Expressing overnight sudden squeezing sensation in her chest; palpitations or skipping heartbeats.  Short winded with activity and mild orthopnea has been reported.   Objective: Vitals:   11/18/22 1508 11/18/22 1529 11/18/22 1600 11/18/22 1700  BP:   (!) 103/54 (!) 116/46  Pulse: 82 77 80 78  Resp: 15 (!) 9 10 (!) 9  Temp:   98.1 F (36.7 C)   TempSrc:   Oral   SpO2: 100% 99% 99% 100%  Weight:      Height:        Intake/Output Summary (Last 24 hours) at 11/18/2022 1758 Last data filed at 11/18/2022 0510 Gross per 24 hour  Intake 240 ml  Output --  Net 240 ml   Weight change: 0.6 kg  Exam: General exam: Alert, awake, oriented x 3; no chest pain, no nausea, no vomiting.  Reports breathing better and currently just mild orthopnea and short winded sensation with activity. Respiratory system: Decreased breath sounds at bases; no using accessory muscles.  2 L nasal cannula supplementation in place. Cardiovascular system: Irregular, no rubs, no gallops;  positive murmur appreciated on exam.  Mechanical click also auscultated. Gastrointestinal system: Abdomen is nondistended, soft and nontender.  Positive bowel sounds. Central nervous system: Alert and oriented. No focal neurological deficits. Extremities: No cyanosis or clubbing; trace edema bilaterally appreciated. Skin: No petechiae. Psychiatry: Judgement and insight appear normal. Mood & affect appropriate.   Data Reviewed: I have personally reviewed following labs and imaging studies  Basic Metabolic Panel: Recent Labs  Lab 11/15/22 1138 11/16/22 0415 11/17/22 0504 11/18/22 0506  NA 131* 131* 134* 133*  K 3.0* 3.7 4.1 3.0*  CL 84* 89* 90* 89*  CO2 33* 31 34* 37*  GLUCOSE 124* 128* 89 90  BUN 21* 18 17 14   CREATININE 0.97 0.88 0.87 0.76  CALCIUM 9.1 8.5* 8.2* 8.1*  MG  --  2.1  --  1.9   Liver Function Tests: Recent Labs  Lab 11/15/22 1138  AST 26  ALT 22  ALKPHOS 193*  BILITOT 1.1  PROT 6.3*  ALBUMIN 3.6   Coagulation Profile: Recent Labs  Lab 11/12/22 1552 11/15/22 1138 11/16/22 0415 11/17/22 0504 11/18/22 0506  INR 8.0* 6.1* 8.9* 3.7* 1.5*   CBC: Recent Labs  Lab 11/15/22 1138 11/16/22 0415 11/17/22 0504 11/17/22 1620 11/18/22 0506  WBC 21.7* 38.4* 16.1*  --  9.3  HGB 8.2* 7.3* 6.8* 8.3* 8.7*  HCT 27.1* 24.1* 22.9* 27.1* 29.3*  MCV 79.5* 80.3 83.0  --  81.8  PLT 313 261 188  --  193   Urine analysis:    Component Value Date/Time   COLORURINE YELLOW 11/16/2022 0825   APPEARANCEUR CLEAR 11/16/2022 0825   LABSPEC 1.012 11/16/2022 0825   PHURINE 6.0 11/16/2022 0825   GLUCOSEU NEGATIVE 11/16/2022 0825   HGBUR NEGATIVE 11/16/2022 0825   BILIRUBINUR NEGATIVE 11/16/2022 0825   KETONESUR NEGATIVE 11/16/2022 0825   PROTEINUR 30 (A) 11/16/2022 0825   NITRITE NEGATIVE 11/16/2022 0825   LEUKOCYTESUR NEGATIVE 11/16/2022 0825   Sepsis Labs:  Recent Results (from the past 240 hour(s))  MRSA Next Gen by PCR, Nasal     Status: None   Collection  Time: 11/15/22  2:36 PM   Specimen: Nasal Mucosa; Nasal Swab  Result Value Ref Range Status   MRSA by PCR Next Gen NOT DETECTED NOT DETECTED Final    Comment: (NOTE) The GeneXpert MRSA Assay (FDA approved for NASAL specimens only), is one component of a comprehensive MRSA colonization surveillance program. It is not intended to diagnose MRSA infection nor to guide or monitor treatment for MRSA infections. Test performance is not FDA approved in patients less than 72 years old. Performed at [facility], [address] [place], [place] [address]   Culture, blood (Routine X 2) w Reflex to ID Panel     Status: None (Preliminary result)   Collection Time: 11/16/22  7:55 AM   Specimen: BLOOD  Result Value Ref Range Status   Specimen Description BLOOD BLOOD RIGHT WRIST  Final   Special Requests   Final    BOTTLES DRAWN AEROBIC AND ANAEROBIC Blood Culture adequate volume   Culture   Final    NO GROWTH 2 DAYS Performed at [facility], [address] [place], [place] [address]    Report Status PENDING  Incomplete  Culture, blood (Routine X 2) w Reflex to ID Panel     Status: None (Preliminary result)   Collection Time: 11/16/22  7:56 AM   Specimen: BLOOD  Result Value Ref Range Status   Specimen Description BLOOD LEFT HAND  Final   Special Requests   Final    BOTTLES DRAWN AEROBIC ONLY Blood Culture adequate volume   Culture   Final    NO GROWTH 2 DAYS Performed at [facility], [address] [place], [place] [address]    Report Status PENDING  Incomplete     Scheduled Meds:  allopurinol  300 mg Oral Daily   ALPRAZolam  0.5 mg Oral TID   Chlorhexidine Gluconate Cloth  6 each Topical Q0600   diltiazem  180 mg Oral Daily   dofetilide  250 mcg Oral BID   erythromycin   Left Eye QHS   feeding supplement  237 mL Oral BID BM   gabapentin  300 mg Oral BID   gabapentin  600 mg Oral QHS   magnesium oxide  400 mg Oral Daily   mometasone-formoterol  2 puff Inhalation BID   olopatadine   1 drop Left Eye BID   pantoprazole (PROTONIX) IV  40 mg Intravenous Q12H   potassium chloride  40 mEq Oral BID   scopolamine  1 patch Transdermal Q72H   sodium chloride flush  3 mL Intravenous Q12H   umeclidinium bromide  1 puff Inhalation Daily   venlafaxine  75 mg Oral BID   Warfarin - Pharmacist Dosing Inpatient   Does not apply q1600   Continuous Infusions:  sodium chloride      Procedures/Studies: ECHOCARDIOGRAM LIMITED  Result Date: 11/17/2022    ECHOCARDIOGRAM LIMITED REPORT  Patient Name:   MASHAYLA LAVIN Date of Exam: 11/17/2022 Medical Rec #:  [id number]    Height:       64.0 in Accession #:    [id number]   Weight:       109.8 lb Date of Birth:  09/26/1960   BSA:          1.516 m Patient Age:    61 years     BP:           153/77 mmHg Patient Gender: F            HR:           97 bpm. [facility] Procedure: Limited Echo, Cardiac Doppler and Limited Color Doppler Indications:    CHF-Acute Diastolic I50.31  History:        Patient has prior history of Echocardiogram examinations, most                 recent 10/15/2022. CHF, Aortic Valve Disease, Arrythmias:Atrial                 Fibrillation, Signs/Symptoms:Murmur; Risk Factors:Non-Smoker.                 Aortic Valve: 19 mm On X mechanical valve is present in the                 aortic position.  Sonographer:    Aron Baba Referring Phys: [id number] VISHNU P MALLIPEDDI  Sonographer Comments: Image acquisition challenging due to respiratory motion and Image acquisition challenging due to patient body habitus. IMPRESSIONS  1. Limited study.  2. Left ventricular ejection fraction, by estimation, is 55 to 60%. The left ventricle has normal function. The left ventricle has no regional wall motion abnormalities. Left ventricular diastolic parameters are indeterminate.  3. Right ventricular systolic function is mildly reduced. The right ventricular size is not well visualized. There is severely elevated pulmonary artery systolic  pressure. The estimated right ventricular systolic pressure is 61.8 mmHg.  4. The mitral valve is abnormal. Moderate mitral valve regurgitation.  5. Tricuspid valve regurgitation is moderate.  6. The aortic valve has been repaired/replaced. Aortic valve regurgitation is mild. There is a 19 mm On X mechanical valve present in the aortic position.  7. The inferior vena cava is dilated in size with <50% respiratory variability, suggesting right atrial pressure of 15 mmHg. Comparison(s): Prior images reviewed side by side. LVEF normal range at 55-60%. Moderate mitral and tricuspid regurgitation. Severely elevated RVSP. AVR not fully interrogated. Mild aortic regurgitation. FINDINGS  Left Ventricle: Left ventricular ejection fraction, by estimation, is 55 to 60%. The left ventricle has normal function. The left ventricle has no regional wall motion abnormalities. The left ventricular internal cavity size was normal in size. There is  no left ventricular hypertrophy. Left ventricular diastolic parameters are indeterminate. Left ventricular diastolic function could not be evaluated due to atrial fibrillation. Right Ventricle: The right ventricular size is not well visualized. Right ventricular systolic function is mildly reduced. There is severely elevated pulmonary artery systolic pressure. The tricuspid regurgitant velocity is 3.42 m/s, and with an assumed right atrial pressure of 15 mmHg, the estimated right ventricular systolic pressure is 61.8 mmHg. Left Atrium: Left atrial size was not well visualized. Right Atrium: Right atrial size was not well visualized. Pericardium: There is no evidence of pericardial effusion. Mitral Valve: The mitral valve is abnormal. Mild to moderate mitral annular calcification. Moderate mitral valve regurgitation. MV peak  gradient, 21.3 mmHg. The mean mitral valve gradient is 8.0 mmHg. Tricuspid Valve: Tricuspid valve regurgitation is moderate. Aortic Valve: The aortic valve has been  repaired/replaced. Aortic valve regurgitation is mild. There is a 19 mm On X mechanical valve present in the aortic position. Venous: The inferior vena cava is dilated in size with less than 50% respiratory variability, suggesting right atrial pressure of 15 mmHg. Additional Comments: Spectral Doppler performed. Color Doppler performed.  LEFT VENTRICLE PLAX 2D LVIDd:         4.40 cm LVIDs:         3.20 cm LV PW:         0.90 cm LV IVS:        0.90 cm  LEFT ATRIUM         Index LA diam:    4.10 cm 2.70 cm/m  MITRAL VALVE                TRICUSPID VALVE MV Area (PHT): 5.97 cm     TR Peak grad:   46.8 mmHg MV Peak grad:  21.3 mmHg    TR Vmax:        342.00 cm/s MV Mean grad:  8.0 mmHg MV Vmax:       2.31 m/s MV Vmean:      125.0 cm/s MV Decel Time: 127 msec MR Peak grad: 119.7 mmHg MR Mean grad: 79.0 mmHg MR Vmax:      547.00 cm/s MR Vmean:     428.0 cm/s MV E velocity: 231.00 cm/s MV A velocity: 63.40 cm/s MV E/A ratio:  3.64 Nona Dell MD Electronically signed by Nona Dell MD Signature Date/Time: 11/17/2022/3:17:47 PM    Final    DG Chest Port 1 View  Result Date: 11/17/2022 CLINICAL DATA:  Congestive heart failure EXAM: PORTABLE CHEST 1 VIEW COMPARISON:  11/15/2022 FINDINGS: Power port remains in place with the tip in the SVC RA junction region. Previous median sternotomy. Previous aortic valve replacement and atrial clip. Worsening of volume loss in both lower lobes since the study of 2 days ago. Probable small bilateral pleural effusions. IMPRESSION: Worsening of volume loss in both lower lobes since the study of 2 days ago. Probable small bilateral pleural effusions. Electronically Signed   By: Paulina Fusi M.D.   On: 11/17/2022 14:55   DG Chest Port 1 View  Result Date: 11/15/2022 CLINICAL DATA:  Atrial fibrillation. EXAM: PORTABLE CHEST 1 VIEW COMPARISON:  October 14, 2022 FINDINGS: Stable support apparatus. Stable postsurgical changes. Enlarged cardiac silhouette. Minimal prominence of the  interstitium. IMPRESSION: 1. Stable support apparatus. 2. Minimal prominence of the interstitium may represent pulmonary vascular congestion. 3. Enlarged cardiac silhouette. Electronically Signed   By: Ted Mcalpine M.D.   On: 11/15/2022 12:11    Vassie Loll, MD  [facility]  If 7PM-7AM, please contact night-coverage [email or web address] Password [email or web address] 11/18/2022, 5:58 PM   LOS: 3 days

## 2022-11-18 NOTE — Progress Notes (Signed)
ANTICOAGULATION CONSULT NOTE  Pharmacy Consult for Coumadin Indication: Afib s/p AVR  Allergies  Allergen Reactions   Amiodarone Nausea Only   Penicillins Hives    Has patient had a PCN reaction causing immediate rash, facial/tongue/throat swelling, SOB or lightheadedness with hypotension: YES Has patient had a PCN reaction causing severe rash involving mucus membranes or skin necrosis: NO Has patient had a PCN reaction that required hospitalization NO Has patient had a PCN reaction occurring within the last 10 years: NO If all of the above answers are "NO", then may proceed with Cephalosporin use.     Vital Signs: Temp: 97.3 F (36.3 C) (07/17 0700) Temp Source: Oral (07/17 0700) BP: 118/60 (07/17 0831) Pulse Rate: 100 (07/17 0831)  Labs: Recent Labs    11/15/22 1138 11/16/22 0415 11/17/22 0504 11/17/22 1620 11/18/22 0506  HGB 8.2* 7.3* 6.8* 8.3* 8.7*  HCT 27.1* 24.1* 22.9* 27.1* 29.3*  PLT 313 261 188  --  193  LABPROT 54.1* 73.1* 36.8*  --  17.9*  INR 6.1* 8.9* 3.7*  --  1.5*  CREATININE 0.97 0.88 0.87  --  0.76  TROPONINIHS 9  --   --   --   --     Estimated Creatinine Clearance: 55 mL/min (by C-G formula based on SCr of 0.76 mg/dL).  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 Coumadin has been on hold since 7/11 yet INR still supratherapeutic likely a result of decreased po d/t ongoing chemo-related nausea/vomiting.   Dramatic decrease in INR overnight to 1.5, hemoglobin improved to 8.7 after transfusion yesterday. No overt bleeding noted.   Usual dosage: 4 mg on Tue and 3 mg ROW   Goal of Therapy:  INR 2-3 Monitor platelets by anticoagulation protocol: Yes   Plan:  Will give 4mg  of warfarin tonight - will need to watch INR closely given poor intake.  INR daily  Sheppard Coil PharmD., BCPS Clinical Pharmacist 11/18/2022 8:58 AM

## 2022-11-19 DIAGNOSIS — Z7901 Long term (current) use of anticoagulants: Secondary | ICD-10-CM | POA: Diagnosis not present

## 2022-11-19 DIAGNOSIS — Z95828 Presence of other vascular implants and grafts: Secondary | ICD-10-CM | POA: Diagnosis not present

## 2022-11-19 DIAGNOSIS — I4891 Unspecified atrial fibrillation: Secondary | ICD-10-CM | POA: Diagnosis not present

## 2022-11-19 DIAGNOSIS — I5033 Acute on chronic diastolic (congestive) heart failure: Secondary | ICD-10-CM | POA: Diagnosis not present

## 2022-11-19 DIAGNOSIS — R791 Abnormal coagulation profile: Secondary | ICD-10-CM | POA: Diagnosis not present

## 2022-11-19 LAB — BASIC METABOLIC PANEL
Anion gap: 9 (ref 5–15)
BUN: 17 mg/dL (ref 6–20)
CO2: 33 mmol/L — ABNORMAL HIGH (ref 22–32)
Calcium: 8.3 mg/dL — ABNORMAL LOW (ref 8.9–10.3)
Chloride: 91 mmol/L — ABNORMAL LOW (ref 98–111)
Creatinine, Ser: 0.71 mg/dL (ref 0.44–1.00)
GFR, Estimated: >60 mL/min (ref >60)
Glucose, Bld: 93 mg/dL (ref 70–99)
Potassium: 3.7 mmol/L (ref 3.5–5.1)
Sodium: 133 mmol/L — ABNORMAL LOW (ref 135–145)

## 2022-11-19 LAB — CBC
HCT: 27.5 % — ABNORMAL LOW (ref 36.0–46.0)
Hemoglobin: 8.1 g/dL — ABNORMAL LOW (ref 12.0–15.0)
MCH: 24.4 pg — ABNORMAL LOW (ref 26.0–34.0)
MCHC: 29.5 g/dL — ABNORMAL LOW (ref 30.0–36.0)
MCV: 82.8 fL (ref 80.0–100.0)
Platelets: 157 10*3/uL (ref 150–400)
RBC: 3.32 MIL/uL — ABNORMAL LOW (ref 3.87–5.11)
RDW: 16.8 % — ABNORMAL HIGH (ref 11.5–15.5)
WBC: 12.5 10*3/uL — ABNORMAL HIGH (ref 4.0–10.5)
nRBC: 0 % (ref 0.0–0.2)

## 2022-11-19 LAB — PROTIME-INR
INR: 1.2 (ref 0.8–1.2)
Prothrombin Time: 15.3 seconds — ABNORMAL HIGH (ref 11.4–15.2)

## 2022-11-19 MED ORDER — WARFARIN SODIUM 2 MG PO TABS
4.0000 mg | ORAL_TABLET | Freq: Once | ORAL | Status: AC
  Administered 2022-11-19: 4 mg via ORAL
  Filled 2022-11-19: qty 2

## 2022-11-19 NOTE — Progress Notes (Signed)
PROGRESS NOTE  Mallory Sutton ZOX:[id number] DOB: Oct 13, 1960 DOA: 11/15/2022 PCP: Leone Payor, FNP  Brief History:  62 year old female with a history of persistent atrial fibrillation, follicular lymphoma stage IV, pulmonary hypertension, bicuspid aortic valve status post AVR, coarctation of the aorta status post aortic arch repair, and COPD?  Presenting with nausea, vomiting, and palpitations.  The patient has been suffering from intermittent nausea and vomiting from chemotherapy for her follicular lymphoma.  She states that her last chemotherapy/immunotherapy was received on 11/04/2022.  She has been receiving rituximab and Bendamustine under the guidance of Dr. Ellin Saba.  She states that she received an injection of Neulasta on 11/06/2022.  She has had intermittent nausea and vomiting after chemotherapy.  However she has developed intractable vomiting on 11/14/2022.  She began having palpitations.  She has noted that her heart rate has been in the 150s.  She has had some chest discomfort and shortness of breath.  As result she presented for further evaluation and treatment. In the ED, the patient was afebrile and hemodynamically stable.  Oxygen saturation was 100% on 2 L.  WBC 21.7, hemoglobin 8.2, platelets 213,000.  Sodium 131, potassium 3.0, bicarbonate 33, serum creatinine 0.97.  LFTs were unremarkable.  Chest x-ray showed vascular congestion.  INR 6.1.  BNP 840.  Troponin 9.  EKG showed atrial fibrillation with nonspecific ST and T wave changes with a heart rate in 130s.  The patient was started on diltiazem drip.  She was admitted for further evaluation and treatment of her A-fib with RVR as well as intractable nausea and vomiting.   Assessment/Plan: Persistent atrial fibrillation with RVR -The patient was started on diltiazem drip initially -converted back to sinus in early am 7/15 -pt states she takes metoprolol succinate 75 mg daily>>restart -IV diltiazem>>po diltiazem CD 180 mg  7/15 -continue dofetilide -10/15/2022 echo EF 55-60%, normal RVEF, mild MR, mechanical AVR -Continue to optimize electrolytes -cardiology consult appreciated   Intractable nausea and vomiting -Therapy has been limited secondary to her dofetilide -Use Compazine if possible and necessary -Considering metoclopramide as well for intractable vomiting -Pantoprazole IV twice daily -Improving so far tolerating diet. -advance to cardiac diet -Hopefully back home in the next 24-48 hours.    Chronic HFpEF -The patient has signs of fluid overload -Continue IV Lasix as recommended by cardiology service. -??ReDS vest accuracy 36>>49>>30; unable to assess rates clip measurement today. -volume status has improved clinically; reporting just mild short winded sensation today.   Leukemoid reaction -This may be from the patient's recent Neulasta injection -Check differential -Check PCT 0.26 -Blood cultures x 2 sets--neg -UA --neg for pyuria -Defer antibiotics presently as the patient is hemodynamically stable and afebrile -Monitor clinically -WBC improving; no fever.   Chronic respiratory failure with hypoxia -Patient is chronically on 2 L nasal cannula -Stable presently on 2 L   Mechanical aortic valve--status post AVR for bicuspid aortic valve -INR is supratherapeutic -INR 8.9>>3.7>>1.5 -no signs of active bleed -allow INR to drift down -Pharmacy to assist with dosing of warfarin   Supratherapeutic INR -Since the patient's hemoglobin is stable with no active bleeding, will allow the patient's INR to drift downward without reversing warfarin at this time -INR 8.9>>3.7 -no signs of active bleed -Down to 1.2 today; pharmacy assisting with dosage to accomplish therapeutic level. -Patient's INR goal 1.5-2.0  Acute anemia -Hgb drifted down to 6.8 -baseline Hgb 8-9 -After transfusion of 1 unit PRBCs on 11/17/2022, patient's hemoglobin  8.3 -Continue to follow trend.   Stage IV follicular  lymphoma -Last dose of chemotherapy with rituximab and Bendamustine 11/04/2022. -Continue patient follow-up with Dr. Ellin Saba (oncology service.   Opioid dependence -PDMP reviewed -Percocet 10/325, #60, last refill 09/30/2022 -Alprazolam 0.5 mg, #90, last refill 10/05/2022 -No planning for controlled substance prescriptions at discharge; patient under contract.   History vertebral compression fracture -Continue Percocet as discussed above -Planning for outpatient vertebroplasty.   Hypokalemia/hypomagnesemia -Continue to follow electrolytes and replete as needed for a goal of magnesium above 2 and potassium above 4   Depression/anxiety -Continue Effexor and alprazolam as previously prescribed -Stable mood.   Port-a-cath in place -Continue local care   COVID exposure -Afebrile -Will check COVID PCR. -Precautions discussed with staff.      Family Communication:   daughter updated 7/16   Consultants:  cardiology   Code Status:  FULL    DVT Prophylaxis:  warfarin     Procedures: As Listed in Progress Note Above   Antibiotics: None   Subjective: No fever, no chest pain, no nausea vomiting.  Reports feeling short winded with activity.  Trace edema appreciated on exam.  Patient reports that her daughter who has been visiting care on daily basis found to be positive for COVID.   Objective: Vitals:   11/19/22 0400 11/19/22 0500 11/19/22 0551 11/19/22 0909  BP: (!) 105/44 (!) 115/56 129/84   Pulse: 74 74 84   Resp: 10 10 20    Temp:   98.7 F (37.1 C)   TempSrc:      SpO2: 100% 100% 100% 100%  Weight:   49.5 kg   Height:        Intake/Output Summary (Last 24 hours) at 11/19/2022 1232 Last data filed at 11/19/2022 1135 Gross per 24 hour  Intake 720 ml  Output 200 ml  Net 520 ml   Weight change: -0.3 kg  Exam: General exam: Alert, awake, oriented x 3; reporting feeling short winded with activity.  No chest pain. Respiratory system: Decreased breath sounds at the  bases; no frank crackles, no using accessory muscle. Cardiovascular system: Rate controlled, no rubs, no gallops, no JVD. Gastrointestinal system: Abdomen is nondistended, soft and nontender. No organomegaly or masses felt. Normal bowel sounds heard. Central nervous system: No focal neurological deficits. Extremities: No cyanosis or clubbing.  Trace edema appreciated bilaterally. Skin: No petechiae. Psychiatry: Judgement and insight appear normal. Mood & affect appropriate.   Data Reviewed: I have personally reviewed following labs and imaging studies  Basic Metabolic Panel: Recent Labs  Lab 11/15/22 1138 11/16/22 0415 11/17/22 0504 11/18/22 0506 11/19/22 0447  NA 131* 131* 134* 133* 133*  K 3.0* 3.7 4.1 3.0* 3.7  CL 84* 89* 90* 89* 91*  CO2 33* 31 34* 37* 33*  GLUCOSE 124* 128* 89 90 93  BUN 21* 18 17 14 17   CREATININE 0.97 0.88 0.87 0.76 0.71  CALCIUM 9.1 8.5* 8.2* 8.1* 8.3*  MG  --  2.1  --  1.9  --    Liver Function Tests: Recent Labs  Lab 11/15/22 1138  AST 26  ALT 22  ALKPHOS 193*  BILITOT 1.1  PROT 6.3*  ALBUMIN 3.6   Coagulation Profile: Recent Labs  Lab 11/15/22 1138 11/16/22 0415 11/17/22 0504 11/18/22 0506 11/19/22 0447  INR 6.1* 8.9* 3.7* 1.5* 1.2   CBC: Recent Labs  Lab 11/15/22 1138 11/16/22 0415 11/17/22 0504 11/17/22 1620 11/18/22 0506 11/19/22 0447  WBC 21.7* 38.4* 16.1*  --  9.3 12.5*  HGB 8.2* 7.3* 6.8* 8.3* 8.7* 8.1*  HCT 27.1* 24.1* 22.9* 27.1* 29.3* 27.5*  MCV 79.5* 80.3 83.0  --  81.8 82.8  PLT 313 261 188  --  193 157   Urine analysis:    Component Value Date/Time   COLORURINE YELLOW 11/16/2022 0825   APPEARANCEUR CLEAR 11/16/2022 0825   LABSPEC 1.012 11/16/2022 0825   PHURINE 6.0 11/16/2022 0825   GLUCOSEU NEGATIVE 11/16/2022 0825   HGBUR NEGATIVE 11/16/2022 0825   BILIRUBINUR NEGATIVE 11/16/2022 0825   KETONESUR NEGATIVE 11/16/2022 0825   PROTEINUR 30 (A) 11/16/2022 0825   NITRITE NEGATIVE 11/16/2022 0825    LEUKOCYTESUR NEGATIVE 11/16/2022 0825   Sepsis Labs:  Recent Results (from the past 240 hour(s))  MRSA Next Gen by PCR, Nasal     Status: None   Collection Time: 11/15/22  2:36 PM   Specimen: Nasal Mucosa; Nasal Swab  Result Value Ref Range Status   MRSA by PCR Next Gen NOT DETECTED NOT DETECTED Final    Comment: (NOTE) The GeneXpert MRSA Assay (FDA approved for NASAL specimens only), is one component of a comprehensive MRSA colonization surveillance program. It is not intended to diagnose MRSA infection nor to guide or monitor treatment for MRSA infections. Test performance is not FDA approved in patients less than 84 years old. Performed at [facility], [address] [place], [place] [address]   Culture, blood (Routine X 2) w Reflex to ID Panel     Status: None (Preliminary result)   Collection Time: 11/16/22  7:55 AM   Specimen: BLOOD  Result Value Ref Range Status   Specimen Description BLOOD BLOOD RIGHT WRIST  Final   Special Requests   Final    BOTTLES DRAWN AEROBIC AND ANAEROBIC Blood Culture adequate volume   Culture   Final    NO GROWTH 3 DAYS Performed at [facility], [address], [place], [place] [address]    Report Status PENDING  Incomplete  Culture, blood (Routine X 2) w Reflex to ID Panel     Status: None (Preliminary result)   Collection Time: 11/16/22  7:56 AM   Specimen: BLOOD  Result Value Ref Range Status   Specimen Description BLOOD LEFT HAND  Final   Special Requests   Final    BOTTLES DRAWN AEROBIC ONLY Blood Culture adequate volume   Culture   Final    NO GROWTH 3 DAYS Performed at [facility], [address] [place], [place] [address]    Report Status PENDING  Incomplete     Scheduled Meds:  allopurinol  300 mg Oral Daily   ALPRAZolam  0.5 mg Oral TID   Chlorhexidine Gluconate Cloth  6 each Topical Q0600   diltiazem  180 mg Oral Daily   dofetilide  250 mcg Oral BID   erythromycin   Left Eye QHS   feeding supplement  237 mL Oral BID BM    gabapentin  300 mg Oral BID   gabapentin  600 mg Oral QHS   magnesium oxide  400 mg Oral Daily   mometasone-formoterol  2 puff Inhalation BID   olopatadine  1 drop Left Eye BID   pantoprazole (PROTONIX) IV  40 mg Intravenous Q12H   potassium chloride  40 mEq Oral BID   scopolamine  1 patch Transdermal Q72H   sodium chloride flush  3 mL Intravenous Q12H   umeclidinium bromide  1 puff Inhalation Daily   venlafaxine  75 mg Oral BID   warfarin  4 mg  Oral ONCE-1600   Warfarin - Pharmacist Dosing Inpatient   Does not apply q1600   Continuous Infusions:  sodium chloride      Procedures/Studies: ECHOCARDIOGRAM LIMITED  Result Date: 11/17/2022    ECHOCARDIOGRAM LIMITED REPORT   Patient Name:   MAYLEE BARE Date of Exam: 11/17/2022 Medical Rec #:  [id number]    Height:       64.0 in Accession #:    [id number]   Weight:       109.8 lb Date of Birth:  05/08/1960   BSA:          1.516 m Patient Age:    61 years     BP:           153/77 mmHg Patient Gender: F            HR:           97 bpm. [facility] Procedure: Limited Echo, Cardiac Doppler and Limited Color Doppler Indications:    CHF-Acute Diastolic I50.31  History:        Patient has prior history of Echocardiogram examinations, most                 recent 10/15/2022. CHF, Aortic Valve Disease, Arrythmias:Atrial                 Fibrillation, Signs/Symptoms:Murmur; Risk Factors:Non-Smoker.                 Aortic Valve: 19 mm On X mechanical valve is present in the                 aortic position.  Sonographer:    Aron Baba Referring Phys: [id number] VISHNU P MALLIPEDDI  Sonographer Comments: Image acquisition challenging due to respiratory motion and Image acquisition challenging due to patient body habitus. IMPRESSIONS  1. Limited study.  2. Left ventricular ejection fraction, by estimation, is 55 to 60%. The left ventricle has normal function. The left ventricle has no regional wall motion abnormalities. Left ventricular diastolic  parameters are indeterminate.  3. Right ventricular systolic function is mildly reduced. The right ventricular size is not well visualized. There is severely elevated pulmonary artery systolic pressure. The estimated right ventricular systolic pressure is 61.8 mmHg.  4. The mitral valve is abnormal. Moderate mitral valve regurgitation.  5. Tricuspid valve regurgitation is moderate.  6. The aortic valve has been repaired/replaced. Aortic valve regurgitation is mild. There is a 19 mm On X mechanical valve present in the aortic position.  7. The inferior vena cava is dilated in size with <50% respiratory variability, suggesting right atrial pressure of 15 mmHg. Comparison(s): Prior images reviewed side by side. LVEF normal range at 55-60%. Moderate mitral and tricuspid regurgitation. Severely elevated RVSP. AVR not fully interrogated. Mild aortic regurgitation. FINDINGS  Left Ventricle: Left ventricular ejection fraction, by estimation, is 55 to 60%. The left ventricle has normal function. The left ventricle has no regional wall motion abnormalities. The left ventricular internal cavity size was normal in size. There is  no left ventricular hypertrophy. Left ventricular diastolic parameters are indeterminate. Left ventricular diastolic function could not be evaluated due to atrial fibrillation. Right Ventricle: The right ventricular size is not well visualized. Right ventricular systolic function is mildly reduced. There is severely elevated pulmonary artery systolic pressure. The tricuspid regurgitant velocity is 3.42 m/s, and with an assumed right atrial pressure of 15 mmHg, the estimated right ventricular systolic pressure is 61.8 mmHg. Left Atrium: Left  atrial size was not well visualized. Right Atrium: Right atrial size was not well visualized. Pericardium: There is no evidence of pericardial effusion. Mitral Valve: The mitral valve is abnormal. Mild to moderate mitral annular calcification. Moderate mitral valve  regurgitation. MV peak gradient, 21.3 mmHg. The mean mitral valve gradient is 8.0 mmHg. Tricuspid Valve: Tricuspid valve regurgitation is moderate. Aortic Valve: The aortic valve has been repaired/replaced. Aortic valve regurgitation is mild. There is a 19 mm On X mechanical valve present in the aortic position. Venous: The inferior vena cava is dilated in size with less than 50% respiratory variability, suggesting right atrial pressure of 15 mmHg. Additional Comments: Spectral Doppler performed. Color Doppler performed.  LEFT VENTRICLE PLAX 2D LVIDd:         4.40 cm LVIDs:         3.20 cm LV PW:         0.90 cm LV IVS:        0.90 cm  LEFT ATRIUM         Index LA diam:    4.10 cm 2.70 cm/m  MITRAL VALVE                TRICUSPID VALVE MV Area (PHT): 5.97 cm     TR Peak grad:   46.8 mmHg MV Peak grad:  21.3 mmHg    TR Vmax:        342.00 cm/s MV Mean grad:  8.0 mmHg MV Vmax:       2.31 m/s MV Vmean:      125.0 cm/s MV Decel Time: 127 msec MR Peak grad: 119.7 mmHg MR Mean grad: 79.0 mmHg MR Vmax:      547.00 cm/s MR Vmean:     428.0 cm/s MV E velocity: 231.00 cm/s MV A velocity: 63.40 cm/s MV E/A ratio:  3.64 Nona Dell MD Electronically signed by Nona Dell MD Signature Date/Time: 11/17/2022/3:17:47 PM    Final    DG Chest Port 1 View  Result Date: 11/17/2022 CLINICAL DATA:  Congestive heart failure EXAM: PORTABLE CHEST 1 VIEW COMPARISON:  11/15/2022 FINDINGS: Power port remains in place with the tip in the SVC RA junction region. Previous median sternotomy. Previous aortic valve replacement and atrial clip. Worsening of volume loss in both lower lobes since the study of 2 days ago. Probable small bilateral pleural effusions. IMPRESSION: Worsening of volume loss in both lower lobes since the study of 2 days ago. Probable small bilateral pleural effusions. Electronically Signed   By: Paulina Fusi M.D.   On: 11/17/2022 14:55   DG Chest Port 1 View  Result Date: 11/15/2022 CLINICAL DATA:  Atrial  fibrillation. EXAM: PORTABLE CHEST 1 VIEW COMPARISON:  October 14, 2022 FINDINGS: Stable support apparatus. Stable postsurgical changes. Enlarged cardiac silhouette. Minimal prominence of the interstitium. IMPRESSION: 1. Stable support apparatus. 2. Minimal prominence of the interstitium may represent pulmonary vascular congestion. 3. Enlarged cardiac silhouette. Electronically Signed   By: Ted Mcalpine M.D.   On: 11/15/2022 12:11    Vassie Loll, MD  [facility]  If 7PM-7AM, please contact night-coverage [email or web address] Password [facility] 11/19/2022, 12:32 PM   LOS: 4 days

## 2022-11-19 NOTE — Progress Notes (Signed)
Progress Note  Patient Name: Mallory Sutton Date of Encounter: 11/19/2022  Primary Cardiologist: Charlton Haws, MD  Subjective   No acute events overnight, patient transferred from ICU to the floor.  Urine output was not charted but patient reported she urinated more than the day before.  Still has SOB.   Inpatient Medications    Scheduled Meds:  allopurinol  300 mg Oral Daily   ALPRAZolam  0.5 mg Oral TID   Chlorhexidine Gluconate Cloth  6 each Topical Q0600   diltiazem  180 mg Oral Daily   dofetilide  250 mcg Oral BID   erythromycin   Left Eye QHS   feeding supplement  237 mL Oral BID BM   gabapentin  300 mg Oral BID   gabapentin  600 mg Oral QHS   magnesium oxide  400 mg Oral Daily   mometasone-formoterol  2 puff Inhalation BID   olopatadine  1 drop Left Eye BID   pantoprazole (PROTONIX) IV  40 mg Intravenous Q12H   potassium chloride  40 mEq Oral BID   scopolamine  1 patch Transdermal Q72H   sodium chloride flush  3 mL Intravenous Q12H   umeclidinium bromide  1 puff Inhalation Daily   venlafaxine  75 mg Oral BID   warfarin  4 mg Oral ONCE-1600   Warfarin - Pharmacist Dosing Inpatient   Does not apply q1600   Continuous Infusions:  sodium chloride     PRN Meds: sodium chloride, acetaminophen, ipratropium-albuterol, oxyCODONE-acetaminophen **AND** oxyCODONE, prochlorperazine, sodium chloride flush   Vital Signs    Vitals:   11/19/22 0300 11/19/22 0400 11/19/22 0500 11/19/22 0551  BP: (!) 113/43 (!) 105/44 (!) 115/56 129/84  Pulse: 75 74 74 84  Resp: (!) 9 10 10 20   Temp:    98.7 F (37.1 C)  TempSrc:      SpO2: 100% 100% 100% 100%  Weight:    49.5 kg  Height:        Intake/Output Summary (Last 24 hours) at 11/19/2022 0903 Last data filed at 11/19/2022 1610 Gross per 24 hour  Intake 480 ml  Output --  Net 480 ml   Filed Weights   11/17/22 1000 11/18/22 0519 11/19/22 0551  Weight: 49.8 kg 46.6 kg 49.5 kg    Telemetry     Personally reviewed,  NSR  ECG    Not performed today  Physical Exam   GEN: No acute distress.   Neck: JVD not examined Cardiac: RRR, no murmur, rub, or gallop.  Respiratory: Nonlabored.  Bibasilar Rales GI: Soft, nontender, bowel sounds present. MS: No edema; No deformity. Neuro:  Nonfocal. Psych: Alert and oriented x 3. Normal affect.  Labs    Chemistry Recent Labs  Lab 11/15/22 1138 11/16/22 0415 11/17/22 0504 11/18/22 0506 11/19/22 0447  NA 131*   < > 134* 133* 133*  K 3.0*   < > 4.1 3.0* 3.7  CL 84*   < > 90* 89* 91*  CO2 33*   < > 34* 37* 33*  GLUCOSE 124*   < > 89 90 93  BUN 21*   < > 17 14 17   CREATININE 0.97   < > 0.87 0.76 0.71  CALCIUM 9.1   < > 8.2* 8.1* 8.3*  PROT 6.3*  --   --   --   --   ALBUMIN 3.6  --   --   --   --   AST 26  --   --   --   --  ALT 22  --   --   --   --   ALKPHOS 193*  --   --   --   --   BILITOT 1.1  --   --   --   --   GFRNONAA >60   < > >60 >60 >60  ANIONGAP 14   < > 10 7 9    < > = values in this interval not displayed.     Hematology Recent Labs  Lab 11/17/22 0504 11/17/22 1620 11/18/22 0506 11/19/22 0447  WBC 16.1*  --  9.3 12.5*  RBC 2.76*  --  3.58* 3.32*  HGB 6.8* 8.3* 8.7* 8.1*  HCT 22.9* 27.1* 29.3* 27.5*  MCV 83.0  --  81.8 82.8  MCH 24.6*  --  24.3* 24.4*  MCHC 29.7*  --  29.7* 29.5*  RDW 17.4*  --  16.5* 16.8*  PLT 188  --  193 157    Cardiac Enzymes Recent Labs  Lab 11/15/22 1138  TROPONINIHS 9    BNP Recent Labs  Lab 11/15/22 1138  BNP 840.0*     DDimerNo results for input(s): "DDIMER" in the last 168 hours.   Radiology    ECHOCARDIOGRAM LIMITED  Result Date: 11/17/2022    ECHOCARDIOGRAM LIMITED REPORT   Patient Name:   Mallory Sutton Date of Exam: 11/17/2022 Medical Rec #:  [id number]    Height:       64.0 in Accession #:    [id number]   Weight:       109.8 lb Date of Birth:  [birth year]   BSA:          1.516 m Patient Age:    61 years     BP:           153/77 mmHg Patient Gender: F            HR:            97 bpm. [facility] Procedure: Limited Echo, Cardiac Doppler and Limited Color Doppler Indications:    CHF-Acute Diastolic I50.31  History:        Patient has prior history of Echocardiogram examinations, most                 recent 10/15/2022. CHF, Aortic Valve Disease, Arrythmias:Atrial                 Fibrillation, Signs/Symptoms:Murmur; Risk Factors:Non-Smoker.                 Aortic Valve: 19 mm On X mechanical valve is present in the                 aortic position.  Sonographer:    Aron Baba Referring Phys: [id number] Brightyn Mozer P Margot Oriordan  Sonographer Comments: Image acquisition challenging due to respiratory motion and Image acquisition challenging due to patient body habitus. IMPRESSIONS  1. Limited study.  2. Left ventricular ejection fraction, by estimation, is 55 to 60%. The left ventricle has normal function. The left ventricle has no regional wall motion abnormalities. Left ventricular diastolic parameters are indeterminate.  3. Right ventricular systolic function is mildly reduced. The right ventricular size is not well visualized. There is severely elevated pulmonary artery systolic pressure. The estimated right ventricular systolic pressure is 61.8 mmHg.  4. The mitral valve is abnormal. Moderate mitral valve regurgitation.  5. Tricuspid valve regurgitation is moderate.  6. The aortic valve has been repaired/replaced. Aortic valve regurgitation is mild. There  is a 19 mm On X mechanical valve present in the aortic position.  7. The inferior vena cava is dilated in size with <50% respiratory variability, suggesting right atrial pressure of 15 mmHg. Comparison(s): Prior images reviewed side by side. LVEF normal range at 55-60%. Moderate mitral and tricuspid regurgitation. Severely elevated RVSP. AVR not fully interrogated. Mild aortic regurgitation. FINDINGS  Left Ventricle: Left ventricular ejection fraction, by estimation, is 55 to 60%. The left ventricle has normal function. The left  ventricle has no regional wall motion abnormalities. The left ventricular internal cavity size was normal in size. There is  no left ventricular hypertrophy. Left ventricular diastolic parameters are indeterminate. Left ventricular diastolic function could not be evaluated due to atrial fibrillation. Right Ventricle: The right ventricular size is not well visualized. Right ventricular systolic function is mildly reduced. There is severely elevated pulmonary artery systolic pressure. The tricuspid regurgitant velocity is 3.42 m/s, and with an assumed right atrial pressure of 15 mmHg, the estimated right ventricular systolic pressure is 61.8 mmHg. Left Atrium: Left atrial size was not well visualized. Right Atrium: Right atrial size was not well visualized. Pericardium: There is no evidence of pericardial effusion. Mitral Valve: The mitral valve is abnormal. Mild to moderate mitral annular calcification. Moderate mitral valve regurgitation. MV peak gradient, 21.3 mmHg. The mean mitral valve gradient is 8.0 mmHg. Tricuspid Valve: Tricuspid valve regurgitation is moderate. Aortic Valve: The aortic valve has been repaired/replaced. Aortic valve regurgitation is mild. There is a 19 mm On X mechanical valve present in the aortic position. Venous: The inferior vena cava is dilated in size with less than 50% respiratory variability, suggesting right atrial pressure of 15 mmHg. Additional Comments: Spectral Doppler performed. Color Doppler performed.  LEFT VENTRICLE PLAX 2D LVIDd:         4.40 cm LVIDs:         3.20 cm LV PW:         0.90 cm LV IVS:        0.90 cm  LEFT ATRIUM         Index LA diam:    4.10 cm 2.70 cm/m  MITRAL VALVE                TRICUSPID VALVE MV Area (PHT): 5.97 cm     TR Peak grad:   46.8 mmHg MV Peak grad:  21.3 mmHg    TR Vmax:        342.00 cm/s MV Mean grad:  8.0 mmHg MV Vmax:       2.31 m/s MV Vmean:      125.0 cm/s MV Decel Time: 127 msec MR Peak grad: 119.7 mmHg MR Mean grad: 79.0 mmHg MR Vmax:       547.00 cm/s MR Vmean:     428.0 cm/s MV E velocity: 231.00 cm/s MV A velocity: 63.40 cm/s MV E/A ratio:  3.64 Nona Dell MD Electronically signed by Nona Dell MD Signature Date/Time: 11/17/2022/3:17:47 PM    Final    DG Chest Port 1 View  Result Date: 11/17/2022 CLINICAL DATA:  Congestive heart failure EXAM: PORTABLE CHEST 1 VIEW COMPARISON:  11/15/2022 FINDINGS: Power port remains in place with the tip in the SVC RA junction region. Previous median sternotomy. Previous aortic valve replacement and atrial clip. Worsening of volume loss in both lower lobes since the study of 2 days ago. Probable small bilateral pleural effusions. IMPRESSION: Worsening of volume loss in both lower lobes since the study of 2 days  ago. Probable small bilateral pleural effusions. Electronically Signed   By: Paulina Fusi M.D.   On: 11/17/2022 14:55     Assessment & Plan   # Acute on chronic HFpEF -Limited echo on 11/17/2022 showed normal LVEF, indeterminate diastology, normal aortic valve prosthesis and CVP 15 mmHg.  Reds vest showed 49 on 11/17/22 a.m. on IV Lasix 80 mg twice daily, urine output was not charted accurately.  But patient reported she urinated more than the day before.  Will continue IV Lasix 80 mg twice daily for now.  Obtain Reds Vest.   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 Goal INR between 1.5 and 2 (due to On-X valve).  # Cortication of aorta repair at age 89, aortic arch repair in 2016, stage IV follicular lymphoma, COPD: Management per primary team.   Signed, Marjo Bicker, MD  11/19/2022, 9:03 AM

## 2022-11-19 NOTE — Progress Notes (Addendum)
ANTICOAGULATION CONSULT NOTE  Pharmacy Consult for Coumadin Indication: Afib s/p AVR (INR goal 1.5-2)  Allergies  Allergen Reactions   Amiodarone Nausea Only   Penicillins Hives    Has patient had a PCN reaction causing immediate rash, facial/tongue/throat swelling, SOB or lightheadedness with hypotension: YES Has patient had a PCN reaction causing severe rash involving mucus membranes or skin necrosis: NO Has patient had a PCN reaction that required hospitalization NO Has patient had a PCN reaction occurring within the last 10 years: NO If all of the above answers are "NO", then may proceed with Cephalosporin use.     Vital Signs: Temp: 98.7 F (37.1 C) (07/18 0551) Temp Source: Oral (07/18 0000) BP: 129/84 (07/18 0551) Pulse Rate: 84 (07/18 0551)  Labs: Recent Labs    11/17/22 0504 11/17/22 1620 11/18/22 0506 11/19/22 0447  HGB 6.8* 8.3* 8.7* 8.1*  HCT 22.9* 27.1* 29.3* 27.5*  PLT 188  --  193 157  LABPROT 36.8*  --  17.9* 15.3*  INR 3.7*  --  1.5* 1.2  CREATININE 0.87  --  0.76 0.71    Estimated Creatinine Clearance: 58.4 mL/min (by C-G formula based on SCr of 0.71 mg/dL).  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 Coumadin has been on hold since 7/11 yet INR still supratherapeutic likely a result of decreased po d/t ongoing chemo-related nausea/vomiting.  Usual dosage: 4 mg on Tue and 3 mg ROW  Date INR Warfarin Dose 7/17 1.5 4 mg 7/18 1.2 4 mg  Patient received one dose of warfarin since restart yesterday on 7/17, which may not be reflected in INR yet due to clotting factor turnover. Anticipating an increase in INR tomorrow.  Per cardiologist note on 6/18, INR goal is 1.5-2 due to bicuspid AVR. Inquired about bridging with heparin and per cards, bridging not necessary for patient's current valve function.  Goal of Therapy:  INR 1.5-2 Monitor platelets by anticoagulation protocol: Yes   Plan:  Give  warfarin 4 mg x 1 today. Anticipating an increase in INR tomorrow. INR daily  Will M. Dareen Piano, PharmD PGY-1 Pharmacy Resident 11/19/2022 8:35 AM

## 2022-11-19 NOTE — Progress Notes (Signed)
Unable to obtain ReDs clip reading on pt. Attempted 3 times. Machine read low quality. MD notified.

## 2022-11-19 NOTE — Plan of Care (Signed)
  Problem: Activity: Goal: Risk for activity intolerance will decrease Outcome: Progressing   

## 2022-11-20 DIAGNOSIS — Z952 Presence of prosthetic heart valve: Secondary | ICD-10-CM | POA: Diagnosis not present

## 2022-11-20 DIAGNOSIS — I4891 Unspecified atrial fibrillation: Secondary | ICD-10-CM | POA: Diagnosis not present

## 2022-11-20 DIAGNOSIS — I5033 Acute on chronic diastolic (congestive) heart failure: Secondary | ICD-10-CM | POA: Diagnosis not present

## 2022-11-20 LAB — BASIC METABOLIC PANEL
Anion gap: 9 (ref 5–15)
BUN: 10 mg/dL (ref 6–20)
CO2: 30 mmol/L (ref 22–32)
Calcium: 8.4 mg/dL — ABNORMAL LOW (ref 8.9–10.3)
Chloride: 97 mmol/L — ABNORMAL LOW (ref 98–111)
Creatinine, Ser: 0.63 mg/dL (ref 0.44–1.00)
GFR, Estimated: >60 mL/min (ref >60)
Glucose, Bld: 88 mg/dL (ref 70–99)
Potassium: 4.2 mmol/L (ref 3.5–5.1)
Sodium: 136 mmol/L (ref 135–145)

## 2022-11-20 LAB — PROTIME-INR
INR: 1.1 (ref 0.8–1.2)
Prothrombin Time: 14.8 seconds (ref 11.4–15.2)

## 2022-11-20 LAB — CULTURE, BLOOD (ROUTINE X 2): Special Requests: ADEQUATE

## 2022-11-20 LAB — SARS CORONAVIRUS 2 (TAT 6-24 HRS): SARS Coronavirus 2: NEGATIVE

## 2022-11-20 MED ORDER — FUROSEMIDE 20 MG PO TABS
ORAL_TABLET | ORAL | Status: AC
  Filled 2022-11-20: qty 4

## 2022-11-20 MED ORDER — FUROSEMIDE 40 MG PO TABS
40.0000 mg | ORAL_TABLET | Freq: Every day | ORAL | Status: DC
  Administered 2022-11-20: 40 mg via ORAL
  Filled 2022-11-20: qty 1

## 2022-11-20 MED ORDER — WARFARIN SODIUM 2 MG PO TABS
4.0000 mg | ORAL_TABLET | Freq: Once | ORAL | Status: AC
  Administered 2022-11-20: 4 mg via ORAL
  Filled 2022-11-20: qty 2

## 2022-11-20 MED ORDER — VITAMIN C 500 MG PO TABS
500.0000 mg | ORAL_TABLET | Freq: Every day | ORAL | Status: DC
  Administered 2022-11-20 – 2022-11-21 (×2): 500 mg via ORAL
  Filled 2022-11-20: qty 1

## 2022-11-20 MED ORDER — VITAMIN C 500 MG PO TABS
ORAL_TABLET | ORAL | Status: AC
  Filled 2022-11-20: qty 1

## 2022-11-20 MED ORDER — ZINC SULFATE 220 (50 ZN) MG PO CAPS
220.0000 mg | ORAL_CAPSULE | Freq: Every day | ORAL | Status: DC
  Administered 2022-11-20 – 2022-11-21 (×2): 220 mg via ORAL
  Filled 2022-11-20: qty 1

## 2022-11-20 MED ORDER — FUROSEMIDE 40 MG PO TABS
80.0000 mg | ORAL_TABLET | Freq: Every day | ORAL | Status: DC
  Administered 2022-11-20 – 2022-11-21 (×2): 80 mg via ORAL
  Filled 2022-11-20: qty 2

## 2022-11-20 MED ORDER — ZINC SULFATE 220 (50 ZN) MG PO CAPS
ORAL_CAPSULE | ORAL | Status: AC
  Filled 2022-11-20: qty 1

## 2022-11-20 NOTE — Progress Notes (Signed)
Progress Note  Patient Name: Mallory Sutton Date of Encounter: 11/20/2022  Primary Cardiologist: Charlton [facility], MD  Subjective   No acute events overnight, has some SOB but she states she always has sob. 1L urine output in the last 24 hours with net negative 200 mL on IV Lasix 80 mg BID. No AKI,   Inpatient Medications    Scheduled Meds:  allopurinol  300 mg Oral Daily   ALPRAZolam  0.5 mg Oral TID   Chlorhexidine Gluconate Cloth  6 each Topical Q0600   diltiazem  180 mg Oral Daily   dofetilide  250 mcg Oral BID   erythromycin   Left Eye QHS   feeding supplement  237 mL Oral BID BM   gabapentin  300 mg Oral BID   gabapentin  600 mg Oral QHS   magnesium oxide  400 mg Oral Daily   mometasone-formoterol  2 puff Inhalation BID   olopatadine  1 drop Left Eye BID   pantoprazole (PROTONIX) IV  40 mg Intravenous Q12H   potassium chloride  40 mEq Oral BID   scopolamine  1 patch Transdermal Q72H   sodium chloride flush  3 mL Intravenous Q12H   umeclidinium bromide  1 puff Inhalation Daily   venlafaxine  75 mg Oral BID   warfarin  4 mg Oral ONCE-1600   Warfarin - Pharmacist Dosing Inpatient   Does not apply q1600   Continuous Infusions:  sodium chloride     PRN Meds: sodium chloride, acetaminophen, ipratropium-albuterol, oxyCODONE-acetaminophen **AND** oxyCODONE, prochlorperazine, sodium chloride flush   Vital Signs    Vitals:   11/19/22 2015 11/19/22 2331 11/20/22 0451 11/20/22 0923  BP:  126/67 121/63   Pulse:  68 80   Resp:   20   Temp:   98.1 F (36.7 C)   TempSrc:   Oral   SpO2: 96%  95% 99%  Weight:   49.5 kg   Height:        Intake/Output Summary (Last 24 hours) at 11/20/2022 0952 Last data filed at 11/20/2022 0453 Gross per 24 hour  Intake 720 ml  Output 1200 ml  Net -480 ml   Filed Weights   11/18/22 0519 11/19/22 0551 11/20/22 0451  Weight: 46.6 kg 49.5 kg 49.5 kg    Telemetry     Personally reviewed, NSR  ECG    Not performed today  Physical  Exam   GEN: No acute distress.   Neck: JVD not elevated Cardiac: RRR, no murmur, rub, or gallop.  Respiratory: Nonlabored.  Bibasilar Rales GI: Soft, nontender, bowel sounds present. MS: No edema; No deformity. Neuro:  Nonfocal. Psych: Alert and oriented x 3. Normal affect.  Labs    Chemistry Recent Labs  Lab 11/15/22 1138 11/16/22 0415 11/18/22 0506 11/19/22 0447 11/20/22 0523  NA 131*   < > 133* 133* 136  K 3.0*   < > 3.0* 3.7 4.2  CL 84*   < > 89* 91* 97*  CO2 33*   < > 37* 33* 30  GLUCOSE 124*   < > 90 93 88  BUN 21*   < > 14 17 10   CREATININE 0.97   < > 0.76 0.71 0.63  CALCIUM 9.1   < > 8.1* 8.3* 8.4*  PROT 6.3*  --   --   --   --   ALBUMIN 3.6  --   --   --   --   AST 26  --   --   --   --  ALT 22  --   --   --   --   ALKPHOS 193*  --   --   --   --   BILITOT 1.1  --   --   --   --   GFRNONAA >60   < > >60 >60 >60  ANIONGAP 14   < > 7 9 9    < > = values in this interval not displayed.     Hematology Recent Labs  Lab 11/17/22 0504 11/17/22 1620 11/18/22 0506 11/19/22 0447  WBC 16.1*  --  9.3 12.5*  RBC 2.76*  --  3.58* 3.32*  HGB 6.8* 8.3* 8.7* 8.1*  HCT 22.9* 27.1* 29.3* 27.5*  MCV 83.0  --  81.8 82.8  MCH 24.6*  --  24.3* 24.4*  MCHC 29.7*  --  29.7* 29.5*  RDW 17.4*  --  16.5* 16.8*  PLT 188  --  193 157    Cardiac Enzymes Recent Labs  Lab 11/15/22 1138  TROPONINIHS 9    BNP Recent Labs  Lab 11/15/22 1138  BNP 840.0*     DDimerNo results for input(s): "DDIMER" in the last 168 hours.   Radiology    No results found.   Assessment & Plan   # Acute on chronic HFpEF -Limited echo on 11/17/2022 showed normal LVEF, indeterminate diastology, normal aortic valve prosthesis and CVP 15 mmHg.  Reds vest showed 49 on 11/17/22 a.m. on IV Lasix 80 mg twice daily, 1.2L urine output in the last 24 hours with net negative 200 mL. Switch IV to PO Lasix 80 mg in AM and 40 mg in PM.   # A-fib/flutter with RVR, currently NSR # s/p AVR on  Coumadin (18 mm on-X valve) -Continue diltiazem 180 mg once daily and Tikosyn 250 mcg twice daily. Goal INR between 1.5 and 2 (due to On-X valve). Coumadin management per pharmacy.  # Cortication of aorta repair at age 34, aortic arch repair in 2016, stage IV follicular lymphoma, COPD: Management per primary team.  [facility] will sign off.   Medication Recommendations:  p.o lasix 80 mg in AM and 40 mg in PM, diltiazem 180 mg once daily, Tikosyn 250 mcg, Warfarin. Other recommendations (labs, testing, etc):  None Follow up as an outpatient:  Keep appointments with Dr Eden Emms on 02/2023 and Dr Ladona Ridgel on 03/2023.    Signed, Marjo Bicker, MD  11/20/2022, 9:52 AM

## 2022-11-20 NOTE — Progress Notes (Signed)
ANTICOAGULATION CONSULT NOTE  Pharmacy Consult for Coumadin Indication: Afib s/p AVR (INR goal 1.5-2)  Allergies  Allergen Reactions   Amiodarone Nausea Only   Penicillins Hives    Has patient had a PCN reaction causing immediate rash, facial/tongue/throat swelling, SOB or lightheadedness with hypotension: YES Has patient had a PCN reaction causing severe rash involving mucus membranes or skin necrosis: NO Has patient had a PCN reaction that required hospitalization NO Has patient had a PCN reaction occurring within the last 10 years: NO If all of the above answers are "NO", then may proceed with Cephalosporin use.     Vital Signs: Temp: 98.1 F (36.7 C) (07/19 0451) Temp Source: Oral (07/19 0451) BP: 121/63 (07/19 0451) Pulse Rate: 80 (07/19 0451)  Labs: Recent Labs    11/17/22 1620 11/18/22 0506 11/19/22 0447 11/20/22 0523  HGB 8.3* 8.7* 8.1*  --   HCT 27.1* 29.3* 27.5*  --   PLT  --  193 157  --   LABPROT  --  17.9* 15.3* 14.8  INR  --  1.5* 1.2 1.1  CREATININE  --  0.76 0.71 0.63    Estimated Creatinine Clearance: 58.4 mL/min (by C-G formula based on SCr of 0.63 mg/dL).  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 Coumadin has been on hold since 7/11 yet INR still supratherapeutic likely a result of decreased po d/t ongoing chemo-related nausea/vomiting.  Usual dosage: 4 mg on Tue and 3 mg ROW  Date INR Warfarin Dose 7/17 1.5 4 mg 7/18 1.2 4 mg 7/19      1.1      4mg   Anticipating an increase in INR tomorrow.  Per cardiologist note on 6/28, INR goal is 1.5-2 due to bicuspid AVR. Inquired about bridging with heparin and per cards, bridging not necessary for patient's current valve function.  Goal of Therapy:  INR 1.5-2 Monitor platelets by anticoagulation protocol: Yes   Plan:  Give warfarin 4 mg x 1 today.  INR daily  Elder Cyphers, BS Pharm D, [place] Clinical Pharmacist 11/20/2022 8:42 AM

## 2022-11-20 NOTE — Progress Notes (Signed)
PROGRESS NOTE  Mallory Sutton ZOX:[id number] DOB: [birth year] DOA: 11/15/2022 PCP: Leone Payor, FNP  Brief History:  62 year old female with a history of persistent atrial fibrillation, follicular lymphoma stage IV, pulmonary hypertension, bicuspid aortic valve status post AVR, coarctation of the aorta status post aortic arch repair, and COPD?  Presenting with nausea, vomiting, and palpitations.  The patient has been suffering from intermittent nausea and vomiting from chemotherapy for her follicular lymphoma.  She states that her last chemotherapy/immunotherapy was received on 11/04/2022.  She has been receiving rituximab and Bendamustine under the guidance of Dr. Ellin Saba.  She states that she received an injection of Neulasta on 11/06/2022.  She has had intermittent nausea and vomiting after chemotherapy.  However she has developed intractable vomiting on 11/14/2022.  She began having palpitations.  She has noted that her heart rate has been in the 150s.  She has had some chest discomfort and shortness of breath.  As result she presented for further evaluation and treatment. In the ED, the patient was afebrile and hemodynamically stable.  Oxygen saturation was 100% on 2 L.  WBC 21.7, hemoglobin 8.2, platelets 213,000.  Sodium 131, potassium 3.0, bicarbonate 33, serum creatinine 0.97.  LFTs were unremarkable.  Chest x-ray showed vascular congestion.  INR 6.1.  BNP 840.  Troponin 9.  EKG showed atrial fibrillation with nonspecific ST and T wave changes with a heart rate in 130s.  The patient was started on diltiazem drip.  She was admitted for further evaluation and treatment of her A-fib with RVR as well as intractable nausea and vomiting.   Assessment/Plan: Persistent atrial fibrillation with RVR -The patient was started on diltiazem drip initially -converted back to sinus in early am 7/15 -pt states she takes metoprolol succinate 75 mg daily>>restart -IV diltiazem>>po diltiazem CD 180 mg  7/15 -continue dofetilide -10/15/2022 echo EF 55-60%, normal RVEF, mild MR, mechanical AVR -Continue to optimize electrolytes -cardiology consult appreciated   Intractable nausea and vomiting -Therapy has been limited secondary to her dofetilide -continue to Use Compazine as necessary -Considering metoclopramide as well for intractable vomiting -Pantoprazole IV twice daily -Improving so far tolerating diet. -advance to cardiac diet -Hopefully back home in the next 24 hours.    Chronic HFpEF -The patient has signs of fluid overload -following cardiology service rec's will transition to oral route diuretics and assess urine output. -??ReDS vest accuracy 36>>49>>30; unable to assess rates clip measurement again today. -volume status has improved clinically; reporting just mild short winded sensation today.   Leukemoid reaction -This may be from the patient's recent Neulasta injection -Check differential -Check PCT 0.26 -Blood cultures x 2 sets--neg -UA --neg for pyuria -Defer antibiotics presently as the patient is hemodynamically stable and afebrile -Monitor clinically -WBC improving; no fever.   Chronic respiratory failure with hypoxia -Patient is chronically on 2 L nasal cannula -Stable presently on 2 L   Mechanical aortic valve--status post AVR for bicuspid aortic valve -INR is supratherapeutic -INR 8.9>>3.7>>1.5>>1.2>>1.1 -no signs of active bleed -allow INR to drift down -Pharmacy to assist with dosing of warfarin   Supratherapeutic INR -Since the patient's hemoglobin is stable with no active bleeding, will allow the patient's INR to drift downward without reversing warfarin at this time -INR 8.9>>3.7 -no signs of active bleed -Down to 1.1 today; pharmacy assisting with dosage to accomplish therapeutic level. -Patient's INR goal 1.5-2.0  Acute anemia -Hgb drifted down to 6.8 -baseline Hgb 8-9 -After transfusion of  1 unit PRBCs on 11/17/2022, patient's latest  hemoglobin 8.3 -Continue to follow trend.   Stage IV follicular lymphoma -Last dose of chemotherapy with rituximab and Bendamustine 11/04/2022. -Continue patient follow-up with Dr. Ellin Saba (oncology service.   Opioid dependence -PDMP reviewed -Percocet 10/325, #60, last refill 09/30/2022 -Alprazolam 0.5 mg, #90, last refill 10/05/2022 -No planning for controlled substance prescriptions at discharge; patient under contract.   History vertebral compression fracture -Continue Percocet as discussed above -Planning for outpatient vertebroplasty after discharge..   Hypokalemia/hypomagnesemia -Continue to follow electrolytes and replete as needed for a goal of magnesium above 2 and potassium above 4   Depression/anxiety -Continue Effexor and alprazolam as previously prescribed -Stable mood.   Port-a-cath in place -Continue local care   COVID exposure -Afebrile, no coughing spells, no sore throat. -COVID PCR negative -Precautions discussed with staff. -continue supportive care, vit C and zinc.      Family Communication:   daughter updated 7/16   Consultants:  cardiology   Code Status:  FULL    DVT Prophylaxis:  warfarin     Procedures: As Listed in Progress Note Above   Antibiotics: None   Subjective: No fever. No CP, no nausea or vomiting. Reports not feeling well. And expressed short winded sensation with activity. No orthopnea.   Objective: Vitals:   11/19/22 2015 11/19/22 2331 11/20/22 0451 11/20/22 0923  BP:  126/67 121/63   Pulse:  68 80   Resp:   20   Temp:   98.1 F (36.7 C)   TempSrc:   Oral   SpO2: 96%  95% 99%  Weight:   49.5 kg   Height:        Intake/Output Summary (Last 24 hours) at 11/20/2022 1616 Last data filed at 11/20/2022 1200 Gross per 24 hour  Intake 720 ml  Output 1000 ml  Net -280 ml   Weight change: 0 kg  Exam: General exam: Alert, awake, oriented x 3, generally weak, short winded with activity and reporting no CP or  palpitations, Respiratory system: decreased BS at the bases, no franck crackles, no wheezing. Cardiovascular system:Rate controlled, + click, no rubs or gallops. No JVD.Marland Kitchen Gastrointestinal system: Abdomen is nondistended, soft and nontender. No organomegaly or masses felt. Normal bowel sounds heard. Central nervous system: Alert and oriented. No focal neurological deficits. Extremities: No cyanosis, no clubbing. Skin: No petechiae. Psychiatry: Judgement and insight appear normal. Mood & affect appropriate.    Data Reviewed: I have personally reviewed following labs and imaging studies  Basic Metabolic Panel: Recent Labs  Lab 11/16/22 0415 11/17/22 0504 11/18/22 0506 11/19/22 0447 11/20/22 0523  NA 131* 134* 133* 133* 136  K 3.7 4.1 3.0* 3.7 4.2  CL 89* 90* 89* 91* 97*  CO2 31 34* 37* 33* 30  GLUCOSE 128* 89 90 93 88  BUN 18 17 14 17 10   CREATININE 0.88 0.87 0.76 0.71 0.63  CALCIUM 8.5* 8.2* 8.1* 8.3* 8.4*  MG 2.1  --  1.9  --   --    Liver Function Tests: Recent Labs  Lab 11/15/22 1138  AST 26  ALT 22  ALKPHOS 193*  BILITOT 1.1  PROT 6.3*  ALBUMIN 3.6   Coagulation Profile: Recent Labs  Lab 11/16/22 0415 11/17/22 0504 11/18/22 0506 11/19/22 0447 11/20/22 0523  INR 8.9* 3.7* 1.5* 1.2 1.1   CBC: Recent Labs  Lab 11/15/22 1138 11/16/22 0415 11/17/22 0504 11/17/22 1620 11/18/22 0506 11/19/22 0447  WBC 21.7* 38.4* 16.1*  --  9.3 12.5*  HGB 8.2* 7.3* 6.8* 8.3* 8.7* 8.1*  HCT 27.1* 24.1* 22.9* 27.1* 29.3* 27.5*  MCV 79.5* 80.3 83.0  --  81.8 82.8  PLT 313 261 188  --  193 157   Urine analysis:    Component Value Date/Time   COLORURINE YELLOW 11/16/2022 0825   APPEARANCEUR CLEAR 11/16/2022 0825   LABSPEC 1.012 11/16/2022 0825   PHURINE 6.0 11/16/2022 0825   GLUCOSEU NEGATIVE 11/16/2022 0825   HGBUR NEGATIVE 11/16/2022 0825   BILIRUBINUR NEGATIVE 11/16/2022 0825   KETONESUR NEGATIVE 11/16/2022 0825   PROTEINUR 30 (A) 11/16/2022 0825   NITRITE  NEGATIVE 11/16/2022 0825   LEUKOCYTESUR NEGATIVE 11/16/2022 0825   Sepsis Labs:  Recent Results (from the past 240 hour(s))  MRSA Next Gen by PCR, Nasal     Status: None   Collection Time: 11/15/22  2:36 PM   Specimen: Nasal Mucosa; Nasal Swab  Result Value Ref Range Status   MRSA by PCR Next Gen NOT DETECTED NOT DETECTED Final    Comment: (NOTE) The GeneXpert MRSA Assay (FDA approved for NASAL specimens only), is one component of a comprehensive MRSA colonization surveillance program. It is not intended to diagnose MRSA infection nor to guide or monitor treatment for MRSA infections. Test performance is not FDA approved in patients less than 93 years old. Performed at [facility], [address] [place], [place] [address]   Culture, blood (Routine X 2) w Reflex to ID Panel     Status: None (Preliminary result)   Collection Time: 11/16/22  7:55 AM   Specimen: BLOOD  Result Value Ref Range Status   Specimen Description BLOOD BLOOD RIGHT WRIST  Final   Special Requests   Final    BOTTLES DRAWN AEROBIC AND ANAEROBIC Blood Culture adequate volume   Culture   Final    NO GROWTH 4 DAYS Performed at [facility], [address], [place], [place] [address]    Report Status PENDING  Incomplete  Culture, blood (Routine X 2) w Reflex to ID Panel     Status: None (Preliminary result)   Collection Time: 11/16/22  7:56 AM   Specimen: BLOOD  Result Value Ref Range Status   Specimen Description BLOOD LEFT HAND  Final   Special Requests   Final    BOTTLES DRAWN AEROBIC ONLY Blood Culture adequate volume   Culture   Final    NO GROWTH 4 DAYS Performed at [facility], [address] [place], [place] [address]    Report Status PENDING  Incomplete  SARS CORONAVIRUS 2 (TAT 6-24 HRS) Anterior Nasal Swab     Status: None   Collection Time: 11/19/22 12:35 PM   Specimen: Anterior Nasal Swab  Result Value Ref Range Status   SARS Coronavirus 2 NEGATIVE NEGATIVE Final    Comment:  (NOTE) SARS-CoV-2 target nucleic acids are NOT DETECTED.  The SARS-CoV-2 RNA is generally detectable in upper and lower respiratory specimens during the acute phase of infection. Negative results do not preclude SARS-CoV-2 infection, do not rule out co-infections with other pathogens, and should not be used as the sole basis for treatment or other patient management decisions. Negative results must be combined with clinical observations, patient history, and epidemiological information. The expected result is Negative.  Fact Sheet for Patients: HairSlick.no  Fact Sheet for Healthcare Providers: quierodirigir.com  This test is not yet approved or cleared by the [place] FDA and  has been authorized for detection and/or diagnosis of SARS-CoV-2 by FDA under an Emergency Use  Authorization (EUA). This EUA will remain  in effect (meaning this test can be used) for the duration of the COVID-19 declaration under Se ction 564(b)(1) of the Act, 21 U.S.C. section 360bbb-3(b)(1), unless the authorization is terminated or revoked sooner.  Performed at [facility], [address] [place], [place] [address]      Scheduled Meds:  allopurinol  300 mg Oral Daily   ALPRAZolam  0.5 mg Oral TID   ascorbic acid  500 mg Oral Daily   Chlorhexidine Gluconate Cloth  6 each Topical Q0600   diltiazem  180 mg Oral Daily   dofetilide  250 mcg Oral BID   erythromycin   Left Eye QHS   feeding supplement  237 mL Oral BID BM   furosemide  40 mg Oral q1800   furosemide  80 mg Oral Daily   gabapentin  300 mg Oral BID   gabapentin  600 mg Oral QHS   magnesium oxide  400 mg Oral Daily   mometasone-formoterol  2 puff Inhalation BID   olopatadine  1 drop Left Eye BID   pantoprazole (PROTONIX) IV  40 mg Intravenous Q12H   potassium chloride  40 mEq Oral BID   scopolamine  1 patch Transdermal Q72H   sodium chloride flush  3 mL Intravenous Q12H    umeclidinium bromide  1 puff Inhalation Daily   venlafaxine  75 mg Oral BID   warfarin  4 mg Oral ONCE-1600   Warfarin - Pharmacist Dosing Inpatient   Does not apply q1600   zinc sulfate  220 mg Oral Daily   Continuous Infusions:  sodium chloride      Procedures/Studies: ECHOCARDIOGRAM LIMITED  Result Date: 11/17/2022    ECHOCARDIOGRAM LIMITED REPORT   Patient Name:   TANICE PETRE Date of Exam: 11/17/2022 Medical Rec #:  [id number]    Height:       64.0 in Accession #:    [id number]   Weight:       109.8 lb Date of Birth:  [birth year]   BSA:          1.516 m Patient Age:    61 years     BP:           153/77 mmHg Patient Gender: F            HR:           97 bpm. [facility] Procedure: Limited Echo, Cardiac Doppler and Limited Color Doppler Indications:    CHF-Acute Diastolic I50.31  History:        Patient has prior history of Echocardiogram examinations, most                 recent 10/15/2022. CHF, Aortic Valve Disease, Arrythmias:Atrial                 Fibrillation, Signs/Symptoms:Murmur; Risk Factors:Non-Smoker.                 Aortic Valve: 19 mm On X mechanical valve is present in the                 aortic position.  Sonographer:    Aron Baba Referring Phys: [id number] VISHNU P MALLIPEDDI  Sonographer Comments: Image acquisition challenging due to respiratory motion and Image acquisition challenging due to patient body habitus. IMPRESSIONS  1. Limited study.  2. Left ventricular ejection fraction, by estimation, is 55 to 60%. The left ventricle has normal function. The left ventricle  has no regional wall motion abnormalities. Left ventricular diastolic parameters are indeterminate.  3. Right ventricular systolic function is mildly reduced. The right ventricular size is not well visualized. There is severely elevated pulmonary artery systolic pressure. The estimated right ventricular systolic pressure is 61.8 mmHg.  4. The mitral valve is abnormal. Moderate mitral valve  regurgitation.  5. Tricuspid valve regurgitation is moderate.  6. The aortic valve has been repaired/replaced. Aortic valve regurgitation is mild. There is a 19 mm On X mechanical valve present in the aortic position.  7. The inferior vena cava is dilated in size with <50% respiratory variability, suggesting right atrial pressure of 15 mmHg. Comparison(s): Prior images reviewed side by side. LVEF normal range at 55-60%. Moderate mitral and tricuspid regurgitation. Severely elevated RVSP. AVR not fully interrogated. Mild aortic regurgitation. FINDINGS  Left Ventricle: Left ventricular ejection fraction, by estimation, is 55 to 60%. The left ventricle has normal function. The left ventricle has no regional wall motion abnormalities. The left ventricular internal cavity size was normal in size. There is  no left ventricular hypertrophy. Left ventricular diastolic parameters are indeterminate. Left ventricular diastolic function could not be evaluated due to atrial fibrillation. Right Ventricle: The right ventricular size is not well visualized. Right ventricular systolic function is mildly reduced. There is severely elevated pulmonary artery systolic pressure. The tricuspid regurgitant velocity is 3.42 m/s, and with an assumed right atrial pressure of 15 mmHg, the estimated right ventricular systolic pressure is 61.8 mmHg. Left Atrium: Left atrial size was not well visualized. Right Atrium: Right atrial size was not well visualized. Pericardium: There is no evidence of pericardial effusion. Mitral Valve: The mitral valve is abnormal. Mild to moderate mitral annular calcification. Moderate mitral valve regurgitation. MV peak gradient, 21.3 mmHg. The mean mitral valve gradient is 8.0 mmHg. Tricuspid Valve: Tricuspid valve regurgitation is moderate. Aortic Valve: The aortic valve has been repaired/replaced. Aortic valve regurgitation is mild. There is a 19 mm On X mechanical valve present in the aortic position. Venous:  The inferior vena cava is dilated in size with less than 50% respiratory variability, suggesting right atrial pressure of 15 mmHg. Additional Comments: Spectral Doppler performed. Color Doppler performed.  LEFT VENTRICLE PLAX 2D LVIDd:         4.40 cm LVIDs:         3.20 cm LV PW:         0.90 cm LV IVS:        0.90 cm  LEFT ATRIUM         Index LA diam:    4.10 cm 2.70 cm/m  MITRAL VALVE                TRICUSPID VALVE MV Area (PHT): 5.97 cm     TR Peak grad:   46.8 mmHg MV Peak grad:  21.3 mmHg    TR Vmax:        342.00 cm/s MV Mean grad:  8.0 mmHg MV Vmax:       2.31 m/s MV Vmean:      125.0 cm/s MV Decel Time: 127 msec MR Peak grad: 119.7 mmHg MR Mean grad: 79.0 mmHg MR Vmax:      547.00 cm/s MR Vmean:     428.0 cm/s MV E velocity: 231.00 cm/s MV A velocity: 63.40 cm/s MV E/A ratio:  3.64 Nona Dell MD Electronically signed by Nona Dell MD Signature Date/Time: 11/17/2022/3:17:47 PM    Final    DG Chest Port 1 [address]  Result Date: 11/17/2022 CLINICAL DATA:  Congestive heart failure EXAM: PORTABLE CHEST 1 VIEW COMPARISON:  11/15/2022 FINDINGS: Power port remains in place with the tip in the SVC RA junction region. Previous median sternotomy. Previous aortic valve replacement and atrial clip. Worsening of volume loss in both lower lobes since the study of 2 days ago. Probable small bilateral pleural effusions. IMPRESSION: Worsening of volume loss in both lower lobes since the study of 2 days ago. Probable small bilateral pleural effusions. Electronically Signed   By: Paulina Fusi M.D.   On: 11/17/2022 14:55   DG Chest Port 1 View  Result Date: 11/15/2022 CLINICAL DATA:  Atrial fibrillation. EXAM: PORTABLE CHEST 1 VIEW COMPARISON:  October 14, 2022 FINDINGS: Stable support apparatus. Stable postsurgical changes. Enlarged cardiac silhouette. Minimal prominence of the interstitium. IMPRESSION: 1. Stable support apparatus. 2. Minimal prominence of the interstitium may represent pulmonary vascular  congestion. 3. Enlarged cardiac silhouette. Electronically Signed   By: Ted Mcalpine M.D.   On: 11/15/2022 12:11    Vassie Loll, MD  [facility]  If 7PM-7AM, please contact night-coverage [email or web address] Password [email or web address] 11/20/2022, 4:16 PM   LOS: 5 days

## 2022-11-21 DIAGNOSIS — C8208 Follicular lymphoma grade I, lymph nodes of multiple sites: Secondary | ICD-10-CM | POA: Diagnosis not present

## 2022-11-21 DIAGNOSIS — Z95828 Presence of other vascular implants and grafts: Secondary | ICD-10-CM | POA: Diagnosis not present

## 2022-11-21 DIAGNOSIS — I4891 Unspecified atrial fibrillation: Secondary | ICD-10-CM | POA: Diagnosis not present

## 2022-11-21 DIAGNOSIS — I5032 Chronic diastolic (congestive) heart failure: Secondary | ICD-10-CM | POA: Diagnosis not present

## 2022-11-21 LAB — CULTURE, BLOOD (ROUTINE X 2)
Culture: NO GROWTH
Culture: NO GROWTH
Special Requests: ADEQUATE

## 2022-11-21 LAB — MAGNESIUM: Magnesium: 2 mg/dL (ref 1.7–2.4)

## 2022-11-21 LAB — BASIC METABOLIC PANEL
Anion gap: 7 (ref 5–15)
BUN: 10 mg/dL (ref 6–20)
CO2: 32 mmol/L (ref 22–32)
Calcium: 8.3 mg/dL — ABNORMAL LOW (ref 8.9–10.3)
Chloride: 98 mmol/L (ref 98–111)
Creatinine, Ser: 0.6 mg/dL (ref 0.44–1.00)
GFR, Estimated: >60 mL/min (ref >60)
Glucose, Bld: 78 mg/dL (ref 70–99)
Potassium: 3.7 mmol/L (ref 3.5–5.1)
Sodium: 137 mmol/L (ref 135–145)

## 2022-11-21 LAB — CBC
HCT: 28 % — ABNORMAL LOW (ref 36.0–46.0)
Hemoglobin: 8.2 g/dL — ABNORMAL LOW (ref 12.0–15.0)
MCH: 24.2 pg — ABNORMAL LOW (ref 26.0–34.0)
MCHC: 29.3 g/dL — ABNORMAL LOW (ref 30.0–36.0)
MCV: 82.6 fL (ref 80.0–100.0)
Platelets: 160 10*3/uL (ref 150–400)
RBC: 3.39 MIL/uL — ABNORMAL LOW (ref 3.87–5.11)
RDW: 17.1 % — ABNORMAL HIGH (ref 11.5–15.5)
WBC: 5.7 10*3/uL (ref 4.0–10.5)
nRBC: 0 % (ref 0.0–0.2)

## 2022-11-21 LAB — PROTIME-INR
INR: 1.2 (ref 0.8–1.2)
Prothrombin Time: 15.6 seconds — ABNORMAL HIGH (ref 11.4–15.2)

## 2022-11-21 MED ORDER — PREDNISONE 20 MG PO TABS: 20.0000 mg | ORAL_TABLET | Freq: Every day | ORAL | 0 refills | Status: DC

## 2022-11-21 MED ORDER — PANTOPRAZOLE SODIUM 40 MG PO TBEC: 40.0000 mg | DELAYED_RELEASE_TABLET | Freq: Two times a day (BID) | ORAL | 2 refills | Status: DC

## 2022-11-21 MED ORDER — PREDNISONE 20 MG PO TABS
20.0000 mg | ORAL_TABLET | Freq: Every day | ORAL | Status: DC
  Administered 2022-11-21: 20 mg via ORAL
  Filled 2022-11-21: qty 1

## 2022-11-21 MED ORDER — ASCORBIC ACID 500 MG PO TABS: 500.0000 mg | ORAL_TABLET | Freq: Every day | ORAL | 1 refills | Status: DC

## 2022-11-21 MED ORDER — FUROSEMIDE 40 MG PO TABS: ORAL_TABLET | ORAL | 3 refills | Status: DC

## 2022-11-21 MED ORDER — ZINC SULFATE 220 (50 ZN) MG PO CAPS: 220.0000 mg | ORAL_CAPSULE | Freq: Every day | ORAL | 2 refills | Status: DC

## 2022-11-21 MED ORDER — WARFARIN SODIUM 5 MG PO TABS: 5.0000 mg | ORAL_TABLET | Freq: Once | ORAL | Status: DC

## 2022-11-21 MED ORDER — DILTIAZEM HCL ER COATED BEADS 180 MG PO CP24: 180.0000 mg | ORAL_CAPSULE | Freq: Every day | ORAL | 1 refills | Status: DC

## 2022-11-21 MED ORDER — METOCLOPRAMIDE HCL 5 MG/ML IJ SOLN: 10.0000 mg | Freq: Once | INTRAMUSCULAR | Status: DC

## 2022-11-21 NOTE — Progress Notes (Signed)
BP 156/89 HR 105 ST per tele patient complains with sob nausea and feeling dizzy. she has vomited 3 times and diarrhea once. Vomit is tan in color no diaphoresis present. MD made aware. Compazine given IV.

## 2022-11-21 NOTE — Progress Notes (Signed)
Compazine effective. Nausea resolved for now. pt sleeping in bed at this present time. MD informed.

## 2022-11-21 NOTE — Progress Notes (Signed)
Discharge instructions reviewed with patient pt verbalized understanding. Dc 'd via wheelchair to private vehicle.

## 2022-11-21 NOTE — Progress Notes (Signed)
ANTICOAGULATION CONSULT NOTE  Pharmacy Consult for Coumadin Indication: Afib s/p AVR (INR goal 1.5-2)  Allergies  Allergen Reactions   Amiodarone Nausea Only   Penicillins Hives    Has patient had a PCN reaction causing immediate rash, facial/tongue/throat swelling, SOB or lightheadedness with hypotension: YES Has patient had a PCN reaction causing severe rash involving mucus membranes or skin necrosis: NO Has patient had a PCN reaction that required hospitalization NO Has patient had a PCN reaction occurring within the last 10 years: NO If all of the above answers are "NO", then may proceed with Cephalosporin use.     Vital Signs: Temp: 97.7 F (36.5 C) (07/20 0927) Temp Source: Oral (07/20 0927) BP: 156/89 (07/20 0927) Pulse Rate: 73 (07/20 0508)  Labs: Recent Labs    11/19/22 0447 11/20/22 0523 11/21/22 0411  HGB 8.1*  --  8.2*  HCT 27.5*  --  28.0*  PLT 157  --  160  LABPROT 15.3* 14.8 15.6*  INR 1.2 1.1 1.2  CREATININE 0.71 0.63 0.60    Estimated Creatinine Clearance: 58.8 mL/min (by C-G formula based on SCr of 0.6 mg/dL).  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 Coumadin has been on hold since 7/11 yet INR still supratherapeutic likely a result of decreased po d/t ongoing chemo-related nausea/vomiting.  Usual dosage: 4 mg on Tue and 3 mg ROW  Date INR Warfarin Dose 7/17 1.5 4 mg 7/18 1.2 4 mg 7/19      1.1      4mg   7/20    1.2        5mg   Per cardiologist note on 6/28, INR goal is 1.5-2 due to bicuspid AVR. Inquired about bridging with heparin and per cards, bridging not necessary for patient's current valve function.  Goal of Therapy:  INR 1.5-2 Monitor platelets by anticoagulation protocol: Yes   Plan:  Give warfarin 5 mg x 1 today.  INR daily  Elder Cyphers, BS Pharm D, [place] Clinical Pharmacist 11/21/2022 10:48 AM

## 2022-11-21 NOTE — Discharge Summary (Signed)
Physician Discharge Summary   Patient: Mallory Sutton MRN: [id number] DOB: 1961-04-25  Admit date:     11/15/2022  Discharge date: 11/21/22  Discharge Physician: Vassie Loll   PCP: Leone Payor, FNP   Recommendations at discharge:  Repeat basic metabolic panel to follow ultralights and renal function Repeat CBC to follow hemoglobin trend/stability Patient to follow-up with cardiology service as instructed Please reassess patient's volume status and further adjust diuretic regimen as required.  Discharge Diagnoses: Principal Problem:   Atrial fibrillation with RVR (HCC) Active Problems:   Hypokalemia   Chronic diastolic CHF (congestive heart failure) (HCC)   Chronic respiratory failure with hypoxia (HCC)   Follicular lymphoma grade I (HCC)   Hypercoagulable state due to persistent atrial fibrillation (HCC)   Persistent atrial fibrillation (HCC)   Port-A-Cath in place   Intractable nausea and vomiting   Acute on chronic heart failure with preserved ejection fraction (HCC)   Supratherapeutic INR   Current use of long term anticoagulation   Hospital Course: 62 year old female with a history of persistent atrial fibrillation, follicular lymphoma stage IV, pulmonary hypertension, bicuspid aortic valve status post AVR, coarctation of the aorta status post aortic arch repair, and COPD?  Presenting with nausea, vomiting, and palpitations.  The patient has been suffering from intermittent nausea and vomiting from chemotherapy for her follicular lymphoma.  She states that her last chemotherapy/immunotherapy was received on 11/04/2022.  She has been receiving rituximab and Bendamustine under the guidance of Dr. Ellin Saba.  She states that she received an injection of Neulasta on 11/06/2022.  She has had intermittent nausea and vomiting after chemotherapy.  However she has developed intractable vomiting on 11/14/2022.  She began having palpitations.  She has noted that her heart rate has been in the  150s.  She has had some chest discomfort and shortness of breath.  As result she presented for further evaluation and treatment. In the ED, the patient was afebrile and hemodynamically stable.  Oxygen saturation was 100% on 2 L.  WBC 21.7, hemoglobin 8.2, platelets 213,000.  Sodium 131, potassium 3.0, bicarbonate 33, serum creatinine 0.97.  LFTs were unremarkable.  Chest x-ray showed vascular congestion.  INR 6.1.  BNP 840.  Troponin 9.  EKG showed atrial fibrillation with nonspecific ST and T wave changes with a heart rate in 130s.  The patient was started on diltiazem drip.  She was admitted for further evaluation and treatment of her A-fib with RVR as well as intractable nausea and vomiting.  Assessment and Plan: Persistent atrial fibrillation with RVR -The patient was started on diltiazem drip initially -converted back to sinus in early am 7/15 -pt states she takes metoprolol succinate 75 mg daily>>restart -IV diltiazem>>po diltiazem CD 180 mg 7/15 -continue dofetilide -10/15/2022 echo EF 55-60%, normal RVEF, mild MR, mechanical AVR -Continue to optimize electrolytes -cardiology consult appreciated   Intractable nausea and vomiting -Therapy has been limited secondary to her dofetilide -Improving so far tolerating diet. -advance to cardiac diet -Continue as needed antiemetics. -Patient advised to maintain adequate hydration.   Chronic HFpEF -The patient has signs of fluid overload -following cardiology service rec's will transition to oral route diuretics and assess urine output. -Patient tolerated transition well; at discharge for use 80 mg of Lasix in the morning and 40 mg in the evening. -??ReDS vest accuracy 36>>49>>30; unable to assess rates clip measurement again today. -volume status has improved/stabilized; continue patient follow-up with cardiology service.   Leukemoid reaction -This may be from the patient's recent Neulasta  injection -Check differential -Check PCT  0.26 -Blood cultures x 2 sets--neg -UA --neg for pyuria -Defer antibiotics presently as the patient is hemodynamically stable and afebrile -Monitor clinically -WBC improving; no fever.   Chronic respiratory failure with hypoxia -Patient is chronically on 2 L nasal cannula -Stable presently on 2 L -Continue home bronchodilator management.   Mechanical aortic valve--status post AVR for bicuspid aortic valve -INR is supratherapeutic -INR 8.9>>3.7>>1.5>>1.2>>1.1>>1.2 and clamping -no signs of active bleed -allow INR to drift down -Resume home dose and closely follow with Coumadin clinic as an outpatient.   Supratherapeutic INR -Since the patient's hemoglobin is stable with no active bleeding, will allow the patient's INR to drift downward without reversing warfarin at this time -INR 8.9>>3.7 -no signs of active bleed -Patient's INR 1.2 today and climbing; resume home Coumadin dosage and closely follow-up with Coumadin clinic for further dose adjustment. -Patient's INR goal 1.5-2.0   Acute anemia -Hgb drifted down to 6.8 -baseline Hgb 8-9 -After transfusion of 1 unit PRBCs on 11/17/2022, patient's latest hemoglobin 8.3 -Continue to follow trend.   Stage IV follicular lymphoma -Last dose of chemotherapy with rituximab and Bendamustine 11/04/2022. -Continue patient follow-up with Dr. Ellin Saba (oncology service.   Opioid dependence -PDMP reviewed -Percocet 10/325, #60, last refill 09/30/2022 -Alprazolam 0.5 mg, #90, last refill 10/05/2022 -No planning for controlled substance prescriptions at discharge; patient is under contract.   History vertebral compression fracture -Continue Percocet as discussed above -Planning for outpatient vertebroplasty after discharge..   Hypokalemia/hypomagnesemia -Continue to follow electrolytes and replete as needed for a goal of magnesium above 2 and potassium above 4   Depression/anxiety -Continue Effexor and alprazolam as previously  prescribed -Stable mood.   Port-a-cath in place -Continue local care   COVID exposure -Afebrile, no coughing spells, no sore throat. -COVID PCR negative -Discussed with patient regarding precaution, good hand hygiene, mask wearing and keeping distances. -continue supportive care, vit C and zinc.  Consultants: Cardiology service. Procedures performed: See below for x-ray reports. Disposition: Home Diet recommendation: Heart healthy/low-sodium diet.  DISCHARGE MEDICATION: Allergies as of 11/21/2022       Reactions   Amiodarone Nausea Only   Penicillins Hives   Has patient had a PCN reaction causing immediate rash, facial/tongue/throat swelling, SOB or lightheadedness with hypotension: YES Has patient had a PCN reaction causing severe rash involving mucus membranes or skin necrosis: NO Has patient had a PCN reaction that required hospitalization NO Has patient had a PCN reaction occurring within the last 10 years: NO If all of the above answers are "NO", then may proceed with Cephalosporin use.        Medication List     STOP taking these medications    metoprolol succinate 100 MG 24 hr tablet Commonly known as: TOPROL-XL   predniSONE 20 MG tablet Commonly known as: DELTASONE   zolpidem 10 MG tablet Commonly known as: AMBIEN       TAKE these medications    acetaminophen 325 MG tablet Commonly known as: TYLENOL Take 2 tablets (650 mg total) by mouth every 6 (six) hours as needed for mild pain (or Fever >/= 101).   allopurinol 300 MG tablet Commonly known as: ZYLOPRIM Take 1 tablet (300 mg total) by mouth daily.   ALPRAZolam 0.5 MG tablet Commonly known as: XANAX Take 0.5 mg by mouth 3 (three) times daily.   ascorbic acid 500 MG tablet Commonly known as: VITAMIN C Take 1 tablet (500 mg total) by mouth daily. Start taking on: November 22, 2022   aspirin EC 81 MG tablet Take 1 tablet (81 mg total) by mouth daily with breakfast.   BENDAMUSTINE HCL IV Inject  into the vein every 28 (twenty-eight) days. Days 1&2 every 28 days   Breztri Aerosphere 160-9-4.8 MCG/ACT Aero Generic drug: Budeson-Glycopyrrol-Formoterol Inhale 2 puffs into the lungs 2 (two) times daily.   calcium-vitamin D 500-5 MG-MCG tablet Commonly known as: OSCAL WITH D Take 1 tablet by mouth daily.   diltiazem 180 MG 24 hr capsule Commonly known as: CARDIZEM CD Take 1 capsule (180 mg total) by mouth daily. Start taking on: November 22, 2022   dofetilide 250 MCG capsule Commonly known as: TIKOSYN Take 1 capsule (250 mcg total) by mouth 2 (two) times daily.   erythromycin ophthalmic ointment Place a 1/2 inch ribbon of ointment into the left lower eyelid BID prn.   feeding supplement Liqd Take 237 mLs by mouth 2 (two) times daily between meals.   furosemide 40 MG tablet Commonly known as: LASIX 80 mg (2 tablets) in the morning and 40 mg (1 tablet) at night. What changed:  how much to take how to take this when to take this additional instructions   gabapentin 300 MG capsule Commonly known as: NEURONTIN Take 300-600 mg by mouth See admin instructions. Take 300 mg tablet by mouth in the morning, 300 mg tablet by mouth in the afternoon, and then take two of the 300 mg tablets by mouth for total of 600 mg in the evening per patient   ipratropium-albuterol 0.5-2.5 (3) MG/3ML Soln Commonly known as: DUONEB Inhale 3 mLs into the lungs 2 (two) times daily as needed (shortness of breath).   lidocaine-prilocaine cream Commonly known as: EMLA Apply a small amount to port a cath site and cover with plastic wrap 1 hour prior to infusion appointments   magnesium oxide 400 (240 Mg) MG tablet Commonly known as: MagOx 400 Take 1 tablet (400 mg total) by mouth daily.   Multi-Vitamins Tabs Take 1 tablet by mouth daily.   olopatadine 0.1 % ophthalmic solution Commonly known as: Pataday Place 1 drop into the left eye 2 (two) times daily.   oxyCODONE-acetaminophen 10-325 MG  tablet Commonly known as: PERCOCET Take 1 tablet by mouth every 6 (six) hours as needed. for pain What changed:  reasons to take this additional instructions   pantoprazole 40 MG tablet Commonly known as: PROTONIX Take 1 tablet (40 mg total) by mouth 2 (two) times daily. What changed: when to take this   potassium chloride SA 20 MEQ tablet Commonly known as: KLOR-CON M Take 2 tablets (40 mEq total) by mouth 3 (three) times daily.   RITUXAN IV Inject into the vein every 28 (twenty-eight) days.   Transderm-Scop 1 MG/3DAYS Generic drug: scopolamine Place 1 patch (1.5 mg total) onto the skin every 3 (three) days.   venlafaxine 75 MG tablet Commonly known as: EFFEXOR Take 75 mg by mouth 2 (two) times daily.   Ventolin HFA 108 (90 Base) MCG/ACT inhaler Generic drug: albuterol Inhale 2 puffs into the lungs every 4 (four) hours as needed for wheezing or shortness of breath.   warfarin 1 MG tablet Commonly known as: COUMADIN Take as directed. If you are unsure how to take this medication, talk to your nurse or doctor. Original instructions: Take warfarin 4 to 5 tablets daily or as directed by coumadin clinic What changed:  how much to take how to take this when to take this   zinc sulfate 220 (  50 Zn) MG capsule Take 1 capsule (220 mg total) by mouth daily. Start taking on: November 22, 2022        Follow-up Information     Leone Payor, FNP. Schedule an appointment as soon as possible for a visit in 10 day(s).   Specialty: Family Medicine Contact information: [address] [place] [address] [phone]                Discharge Exam: Filed Weights   11/19/22 0551 11/20/22 0451 11/21/22 0500  Weight: 49.5 kg 49.5 kg 49.8 kg    General exam: Alert, awake, oriented x 3, generally weak, minimally short winded with activity and reporting no CP or palpitations; patient expressed no orthopnea and demonstrate good saturation on 2 L supplementation.  Feeling  ready for discharge. Respiratory system: decreased BS at the bases, no franck crackles, no wheezing. Cardiovascular system:Rate controlled, + click, no rubs or gallops. No JVD.Marland Kitchen Gastrointestinal system: Abdomen is nondistended, soft and nontender. No organomegaly or masses felt. Normal bowel sounds heard. Central nervous system: Alert and oriented. No focal neurological deficits. Extremities: No cyanosis, no clubbing. Skin: No petechiae. Psychiatry: Judgement and insight appear normal. Mood & affect appropriate.   Condition at discharge: Stable and improved.  The results of significant diagnostics from this hospitalization (including imaging, microbiology, ancillary and laboratory) are listed below for reference.   Imaging Studies: ECHOCARDIOGRAM LIMITED  Result Date: 11/17/2022    ECHOCARDIOGRAM LIMITED REPORT   Patient Name:   Mallory Sutton Date of Exam: 11/17/2022 Medical Rec #:  [id number]    Height:       64.0 in Accession #:    [id number]   Weight:       109.8 lb Date of Birth:  08-May-1960   BSA:          1.516 m Patient Age:    61 years     BP:           153/77 mmHg Patient Gender: F            HR:           97 bpm. [facility] Procedure: Limited Echo, Cardiac Doppler and Limited Color Doppler Indications:    CHF-Acute Diastolic I50.31  History:        Patient has prior history of Echocardiogram examinations, most                 recent 10/15/2022. CHF, Aortic Valve Disease, Arrythmias:Atrial                 Fibrillation, Signs/Symptoms:Murmur; Risk Factors:Non-Smoker.                 Aortic Valve: 19 mm On X mechanical valve is present in the                 aortic position.  Sonographer:    Aron Baba Referring Phys: [id number] VISHNU P MALLIPEDDI  Sonographer Comments: Image acquisition challenging due to respiratory motion and Image acquisition challenging due to patient body habitus. IMPRESSIONS  1. Limited study.  2. Left ventricular ejection fraction, by estimation, is 55 to  60%. The left ventricle has normal function. The left ventricle has no regional wall motion abnormalities. Left ventricular diastolic parameters are indeterminate.  3. Right ventricular systolic function is mildly reduced. The right ventricular size is not well visualized. There is severely elevated pulmonary artery systolic pressure. The estimated right ventricular systolic pressure is 61.8  mmHg.  4. The mitral valve is abnormal. Moderate mitral valve regurgitation.  5. Tricuspid valve regurgitation is moderate.  6. The aortic valve has been repaired/replaced. Aortic valve regurgitation is mild. There is a 19 mm On X mechanical valve present in the aortic position.  7. The inferior vena cava is dilated in size with <50% respiratory variability, suggesting right atrial pressure of 15 mmHg. Comparison(s): Prior images reviewed side by side. LVEF normal range at 55-60%. Moderate mitral and tricuspid regurgitation. Severely elevated RVSP. AVR not fully interrogated. Mild aortic regurgitation. FINDINGS  Left Ventricle: Left ventricular ejection fraction, by estimation, is 55 to 60%. The left ventricle has normal function. The left ventricle has no regional wall motion abnormalities. The left ventricular internal cavity size was normal in size. There is  no left ventricular hypertrophy. Left ventricular diastolic parameters are indeterminate. Left ventricular diastolic function could not be evaluated due to atrial fibrillation. Right Ventricle: The right ventricular size is not well visualized. Right ventricular systolic function is mildly reduced. There is severely elevated pulmonary artery systolic pressure. The tricuspid regurgitant velocity is 3.42 m/s, and with an assumed right atrial pressure of 15 mmHg, the estimated right ventricular systolic pressure is 61.8 mmHg. Left Atrium: Left atrial size was not well visualized. Right Atrium: Right atrial size was not well visualized. Pericardium: There is no evidence of  pericardial effusion. Mitral Valve: The mitral valve is abnormal. Mild to moderate mitral annular calcification. Moderate mitral valve regurgitation. MV peak gradient, 21.3 mmHg. The mean mitral valve gradient is 8.0 mmHg. Tricuspid Valve: Tricuspid valve regurgitation is moderate. Aortic Valve: The aortic valve has been repaired/replaced. Aortic valve regurgitation is mild. There is a 19 mm On X mechanical valve present in the aortic position. Venous: The inferior vena cava is dilated in size with less than 50% respiratory variability, suggesting right atrial pressure of 15 mmHg. Additional Comments: Spectral Doppler performed. Color Doppler performed.  LEFT VENTRICLE PLAX 2D LVIDd:         4.40 cm LVIDs:         3.20 cm LV PW:         0.90 cm LV IVS:        0.90 cm  LEFT ATRIUM         Index LA diam:    4.10 cm 2.70 cm/m  MITRAL VALVE                TRICUSPID VALVE MV Area (PHT): 5.97 cm     TR Peak grad:   46.8 mmHg MV Peak grad:  21.3 mmHg    TR Vmax:        342.00 cm/s MV Mean grad:  8.0 mmHg MV Vmax:       2.31 m/s MV Vmean:      125.0 cm/s MV Decel Time: 127 msec MR Peak grad: 119.7 mmHg MR Mean grad: 79.0 mmHg MR Vmax:      547.00 cm/s MR Vmean:     428.0 cm/s MV E velocity: 231.00 cm/s MV A velocity: 63.40 cm/s MV E/A ratio:  3.64 Nona Dell MD Electronically signed by Nona Dell MD Signature Date/Time: 11/17/2022/3:17:47 PM    Final    DG Chest Port 1 View  Result Date: 11/17/2022 CLINICAL DATA:  Congestive heart failure EXAM: PORTABLE CHEST 1 VIEW COMPARISON:  11/15/2022 FINDINGS: Power port remains in place with the tip in the SVC RA junction region. Previous median sternotomy. Previous aortic valve replacement and atrial clip. Worsening of volume  loss in both lower lobes since the study of 2 days ago. Probable small bilateral pleural effusions. IMPRESSION: Worsening of volume loss in both lower lobes since the study of 2 days ago. Probable small bilateral pleural effusions. Electronically  Signed   By: Paulina Fusi M.D.   On: 11/17/2022 14:55   DG Chest Port 1 View  Result Date: 11/15/2022 CLINICAL DATA:  Atrial fibrillation. EXAM: PORTABLE CHEST 1 VIEW COMPARISON:  October 14, 2022 FINDINGS: Stable support apparatus. Stable postsurgical changes. Enlarged cardiac silhouette. Minimal prominence of the interstitium. IMPRESSION: 1. Stable support apparatus. 2. Minimal prominence of the interstitium may represent pulmonary vascular congestion. 3. Enlarged cardiac silhouette. Electronically Signed   By: Ted Mcalpine M.D.   On: 11/15/2022 12:11    Microbiology: Results for orders placed or performed during the hospital encounter of 11/15/22  MRSA Next Gen by PCR, Nasal     Status: None   Collection Time: 11/15/22  2:36 PM   Specimen: Nasal Mucosa; Nasal Swab  Result Value Ref Range Status   MRSA by PCR Next Gen NOT DETECTED NOT DETECTED Final    Comment: (NOTE) The GeneXpert MRSA Assay (FDA approved for NASAL specimens only), is one component of a comprehensive MRSA colonization surveillance program. It is not intended to diagnose MRSA infection nor to guide or monitor treatment for MRSA infections. Test performance is not FDA approved in patients less than 85 years old. Performed at [facility], [address], [place], [place] [address]   Culture, blood (Routine X 2) w Reflex to ID Panel     Status: None   Collection Time: 11/16/22  7:55 AM   Specimen: BLOOD  Result Value Ref Range Status   Specimen Description BLOOD BLOOD RIGHT WRIST  Final   Special Requests   Final    BOTTLES DRAWN AEROBIC AND ANAEROBIC Blood Culture adequate volume   Culture   Final    NO GROWTH 5 DAYS Performed at [facility], [address], [place], [place] [address]    Report Status 11/21/2022 FINAL  Final  Culture, blood (Routine X 2) w Reflex to ID Panel     Status: None   Collection Time: 11/16/22  7:56 AM   Specimen: BLOOD  Result Value Ref Range Status   Specimen Description BLOOD LEFT  HAND  Final   Special Requests   Final    BOTTLES DRAWN AEROBIC ONLY Blood Culture adequate volume   Culture   Final    NO GROWTH 5 DAYS Performed at [facility], [address], [place], [place] [address]    Report Status 11/21/2022 FINAL  Final  SARS CORONAVIRUS 2 (TAT 6-24 HRS) Anterior Nasal Swab     Status: None   Collection Time: 11/19/22 12:35 PM   Specimen: Anterior Nasal Swab  Result Value Ref Range Status   SARS Coronavirus 2 NEGATIVE NEGATIVE Final    Comment: (NOTE) SARS-CoV-2 target nucleic acids are NOT DETECTED.  The SARS-CoV-2 RNA is generally detectable in upper and lower respiratory specimens during the acute phase of infection. Negative results do not preclude SARS-CoV-2 infection, do not rule out co-infections with other pathogens, and should not be used as the sole basis for treatment or other patient management decisions. Negative results must be combined with clinical observations, patient history, and epidemiological information. The expected result is Negative.  Fact Sheet for Patients: HairSlick.no  Fact Sheet for Healthcare Providers: quierodirigir.com  This test is not yet approved or cleared by the [place] FDA  and  has been authorized for detection and/or diagnosis of SARS-CoV-2 by FDA under an Emergency Use Authorization (EUA). This EUA will remain  in effect (meaning this test can be used) for the duration of the COVID-19 declaration under Se ction 564(b)(1) of the Act, 21 U.S.C. section 360bbb-3(b)(1), unless the authorization is terminated or revoked sooner.  Performed at [facility], [address], [place], [place] [address]     Labs: CBC: Recent Labs  Lab 11/16/22 [id number] 11/17/22 0504 11/17/22 1620 11/18/22 0506 11/19/22 0447 11/21/22 0411  WBC 38.4* 16.1*  --  9.3 12.5* 5.7  HGB 7.3* 6.8* 8.3* 8.7* 8.1* 8.2*  HCT 24.1* 22.9* 27.1* 29.3* 27.5* 28.0*  MCV 80.3  83.0  --  81.8 82.8 82.6  PLT 261 188  --  193 157 160   Basic Metabolic Panel: Recent Labs  Lab 11/16/22 0415 11/17/22 0504 11/18/22 0506 11/19/22 0447 11/20/22 0523 11/21/22 0411  NA 131* 134* 133* 133* 136 137  K 3.7 4.1 3.0* 3.7 4.2 3.7  CL 89* 90* 89* 91* 97* 98  CO2 31 34* 37* 33* 30 32  GLUCOSE 128* 89 90 93 88 78  BUN 18 17 14 17 10 10   CREATININE 0.88 0.87 0.76 0.71 0.63 0.60  CALCIUM 8.5* 8.2* 8.1* 8.3* 8.4* 8.3*  MG 2.1  --  1.9  --   --  2.0   Liver Function Tests: Recent Labs  Lab 11/15/22 1138  AST 26  ALT 22  ALKPHOS 193*  BILITOT 1.1  PROT 6.3*  ALBUMIN 3.6   CBG: No results for input(s): "GLUCAP" in the last 168 hours.  Discharge time spent: greater than 30 minutes.  Signed: Vassie Loll, MD [facility] 11/21/2022

## 2022-11-24 ENCOUNTER — Inpatient Hospital Stay: Payer: Medicaid Other

## 2022-11-24 ENCOUNTER — Telehealth: Payer: Self-pay | Admitting: *Deleted

## 2022-11-24 DIAGNOSIS — C8208 Follicular lymphoma grade I, lymph nodes of multiple sites: Secondary | ICD-10-CM | POA: Diagnosis not present

## 2022-11-24 DIAGNOSIS — D649 Anemia, unspecified: Secondary | ICD-10-CM | POA: Diagnosis not present

## 2022-11-24 DIAGNOSIS — Z5112 Encounter for antineoplastic immunotherapy: Secondary | ICD-10-CM | POA: Diagnosis present

## 2022-11-24 DIAGNOSIS — E876 Hypokalemia: Secondary | ICD-10-CM | POA: Diagnosis not present

## 2022-11-24 DIAGNOSIS — Z5189 Encounter for other specified aftercare: Secondary | ICD-10-CM | POA: Diagnosis not present

## 2022-11-24 DIAGNOSIS — Z5111 Encounter for antineoplastic chemotherapy: Secondary | ICD-10-CM | POA: Diagnosis present

## 2022-11-24 LAB — COMPREHENSIVE METABOLIC PANEL
ALT: 24 U/L (ref 0–44)
AST: 33 U/L (ref 15–41)
Albumin: 3.7 g/dL (ref 3.5–5.0)
Alkaline Phosphatase: 112 U/L (ref 38–126)
Anion gap: 12 (ref 5–15)
BUN: 11 mg/dL (ref 6–20)
CO2: 24 mmol/L (ref 22–32)
Calcium: 8.8 mg/dL — ABNORMAL LOW (ref 8.9–10.3)
Chloride: 103 mmol/L (ref 98–111)
Creatinine, Ser: 0.61 mg/dL (ref 0.44–1.00)
GFR, Estimated: >60 mL/min (ref >60)
Glucose, Bld: 93 mg/dL (ref 70–99)
Potassium: 2.8 mmol/L — ABNORMAL LOW (ref 3.5–5.1)
Sodium: 139 mmol/L (ref 135–145)
Total Bilirubin: 1.3 mg/dL — ABNORMAL HIGH (ref 0.3–1.2)
Total Protein: 6.4 g/dL — ABNORMAL LOW (ref 6.5–8.1)

## 2022-11-24 LAB — CBC WITH DIFFERENTIAL/PLATELET
Abs Immature Granulocytes: 0.08 10*3/uL — ABNORMAL HIGH (ref 0.00–0.07)
Basophils Absolute: 0.1 10*3/uL (ref 0.0–0.1)
Basophils Relative: 1 %
Eosinophils Absolute: 0.1 10*3/uL (ref 0.0–0.5)
Eosinophils Relative: 1 %
HCT: 31.5 % — ABNORMAL LOW (ref 36.0–46.0)
Hemoglobin: 9.5 g/dL — ABNORMAL LOW (ref 12.0–15.0)
Immature Granulocytes: 1 %
Lymphocytes Relative: 10 %
Lymphs Abs: 0.9 10*3/uL (ref 0.7–4.0)
MCH: 24.2 pg — ABNORMAL LOW (ref 26.0–34.0)
MCHC: 30.2 g/dL (ref 30.0–36.0)
MCV: 80.2 fL (ref 80.0–100.0)
Monocytes Absolute: 0.7 10*3/uL (ref 0.1–1.0)
Monocytes Relative: 8 %
Neutro Abs: 7 10*3/uL (ref 1.7–7.7)
Neutrophils Relative %: 79 %
Platelets: 172 10*3/uL (ref 150–400)
RBC: 3.93 MIL/uL (ref 3.87–5.11)
RDW: 17.2 % — ABNORMAL HIGH (ref 11.5–15.5)
WBC: 8.8 10*3/uL (ref 4.0–10.5)
nRBC: 0 % (ref 0.0–0.2)

## 2022-11-24 LAB — MAGNESIUM: Magnesium: 2.1 mg/dL (ref 1.7–2.4)

## 2022-11-24 MED ORDER — POTASSIUM CHLORIDE IN NACL 20-0.9 MEQ/L-% IV SOLN
INTRAVENOUS | Status: DC
  Filled 2022-11-24 (×2): qty 1000

## 2022-11-24 MED ORDER — MAGNESIUM SULFATE 2 GM/50ML IV SOLN
2.0000 g | Freq: Once | INTRAVENOUS | Status: AC
  Administered 2022-11-24: 2 g via INTRAVENOUS
  Filled 2022-11-24: qty 50

## 2022-11-24 MED ORDER — SODIUM CHLORIDE 0.9% FLUSH
10.0000 mL | Freq: Once | INTRAVENOUS | Status: AC
  Administered 2022-11-24: 10 mL via INTRAVENOUS

## 2022-11-24 MED ORDER — HEPARIN SOD (PORK) LOCK FLUSH 100 UNIT/ML IV SOLN
500.0000 [IU] | Freq: Once | INTRAVENOUS | Status: AC
  Administered 2022-11-24: 500 [IU] via INTRAVENOUS

## 2022-11-24 MED ORDER — PROCHLORPERAZINE EDISYLATE 10 MG/2ML IJ SOLN
10.0000 mg | Freq: Once | INTRAMUSCULAR | Status: AC
  Administered 2022-11-24: 10 mg via INTRAVENOUS
  Filled 2022-11-24: qty 2

## 2022-11-24 NOTE — Patient Instructions (Signed)
[facility]  Discharge Instructions: Thank you for choosing [facility] to provide your oncology and hematology care.  If you have a lab appointment with the Cancer Center - please note that after April 8th, 2024, all labs will be drawn in the cancer center.  You do not have to check in or register with the main entrance as you have in the past but will complete your check-in in the cancer center.  Wear comfortable clothing and clothing appropriate for easy access to any Portacath or PICC line.   We strive to give you quality time with your provider. You may need to reschedule your appointment if you arrive late (15 or more minutes).  Arriving late affects you and other patients whose appointments are after yours.  Also, if you miss three or more appointments without notifying the office, you may be dismissed from the clinic at the provider's discretion.      For prescription refill requests, have your pharmacy contact our office and allow 72 hours for refills to be completed.    Today you received the following chemotherapy and/or immunotherapy agents House Fluids      To help prevent nausea and vomiting after your treatment, we encourage you to take your nausea medication as directed.  BELOW ARE SYMPTOMS THAT SHOULD BE REPORTED IMMEDIATELY: *FEVER GREATER THAN 100.4 F (38 C) OR HIGHER *CHILLS OR SWEATING *NAUSEA AND VOMITING THAT IS NOT CONTROLLED WITH YOUR NAUSEA MEDICATION *UNUSUAL SHORTNESS OF BREATH *UNUSUAL BRUISING OR BLEEDING *URINARY PROBLEMS (pain or burning when urinating, or frequent urination) *BOWEL PROBLEMS (unusual diarrhea, constipation, pain near the anus) TENDERNESS IN MOUTH AND THROAT WITH OR WITHOUT PRESENCE OF ULCERS (sore throat, sores in mouth, or a toothache) UNUSUAL RASH, SWELLING OR PAIN  UNUSUAL VAGINAL DISCHARGE OR ITCHING   Items with * indicate a potential emergency and should be followed up as soon as possible or go to  the Emergency Department if any problems should occur.  Please show the CHEMOTHERAPY ALERT CARD or IMMUNOTHERAPY ALERT CARD at check-in to the Emergency Department and triage nurse.  Should you have questions after your visit or need to cancel or reschedule your appointment, please contact [facility] [phone]  and follow the prompts.  Office hours are 8:00 a.m. to 4:30 p.m. Monday - Friday. Please note that voicemails left after 4:00 p.m. may not be returned until the following business day.  We are closed weekends and major holidays. You have access to a nurse at all times for urgent questions. Please call the main number to the clinic [phone] and follow the prompts.  For any non-urgent questions, you may also contact your provider using MyChart. We now offer e-Visits for anyone 90 and older to request care online for non-urgent symptoms. For details visit [email or web address].   Also download the MyChart app! Go to the app store, search "MyChart", open the app, select [place], and log in with your MyChart username and password.

## 2022-11-24 NOTE — Progress Notes (Unsigned)
Patient presents today for House fluids and antiemetics per providers order.  Vital signs WNL. Stable during infusion without adverse affects.  Vital signs stable.  No complaints at this time.  Discharge from clinic ambulatory in stable condition.  Alert and oriented X 3.  Follow up with [facility] as scheduled.

## 2022-11-24 NOTE — Telephone Encounter (Signed)
Received message from patient this morning stating that she has been vomiting every waking hour since hospital discharge, despite use of antiemetics, which she cannot keep down.  Feels very dehydrated and weak.  Will bring her in for house fluids and additional treatment today, per Dr. Ellin Saba.

## 2022-11-25 ENCOUNTER — Other Ambulatory Visit: Payer: Self-pay | Admitting: *Deleted

## 2022-11-25 ENCOUNTER — Encounter: Payer: Self-pay | Admitting: Hematology

## 2022-11-25 MED ORDER — LORAZEPAM 0.5 MG PO TABS: 0.5000 mg | ORAL_TABLET | Freq: Three times a day (TID) | ORAL | 0 refills | Status: DC

## 2022-11-26 ENCOUNTER — Encounter (HOSPITAL_COMMUNITY): Payer: Medicaid Other

## 2022-12-01 ENCOUNTER — Inpatient Hospital Stay: Payer: Medicaid Other | Admitting: Hematology

## 2022-12-01 ENCOUNTER — Inpatient Hospital Stay: Payer: Medicaid Other

## 2022-12-02 ENCOUNTER — Inpatient Hospital Stay: Payer: Medicaid Other

## 2022-12-03 ENCOUNTER — Other Ambulatory Visit (HOSPITAL_COMMUNITY): Payer: Medicaid Other

## 2022-12-03 DEATH — deceased

## 2022-12-04 ENCOUNTER — Inpatient Hospital Stay: Payer: Medicaid Other

## 2022-12-29 ENCOUNTER — Inpatient Hospital Stay: Payer: Medicaid Other

## 2022-12-29 ENCOUNTER — Inpatient Hospital Stay: Payer: Medicaid Other | Admitting: Hematology

## 2022-12-30 ENCOUNTER — Inpatient Hospital Stay: Payer: Medicaid Other

## 2023-01-01 ENCOUNTER — Inpatient Hospital Stay: Payer: Medicaid Other

## 2023-01-08 ENCOUNTER — Ambulatory Visit: Payer: Medicaid Other | Admitting: Pulmonary Disease

## 2023-02-05 ENCOUNTER — Ambulatory Visit: Payer: Medicaid Other | Admitting: Cardiovascular Disease

## 2023-03-08 ENCOUNTER — Ambulatory Visit: Payer: Medicaid Other | Admitting: Internal Medicine

## 2023-03-20 IMAGING — US US EXTREM LOW*R* LIMITED
1 series · 10 of 10 positions shown · non-contrast
Comparison: Ultrasound 08/29/2020.

CLINICAL DATA: Localized swelling, mass, or lump of right lower
extremity

EXAM:
ULTRASOUND right LOWER EXTREMITY LIMITED
TECHNIQUE: Ultrasound examination of the lower extremity soft tissues was
performed in the area of clinical concern.

[Series 1: us extrem low*right* limited · 0.03mm/px · 10 acquisitions, 10 frames shown]
[im 1/10]
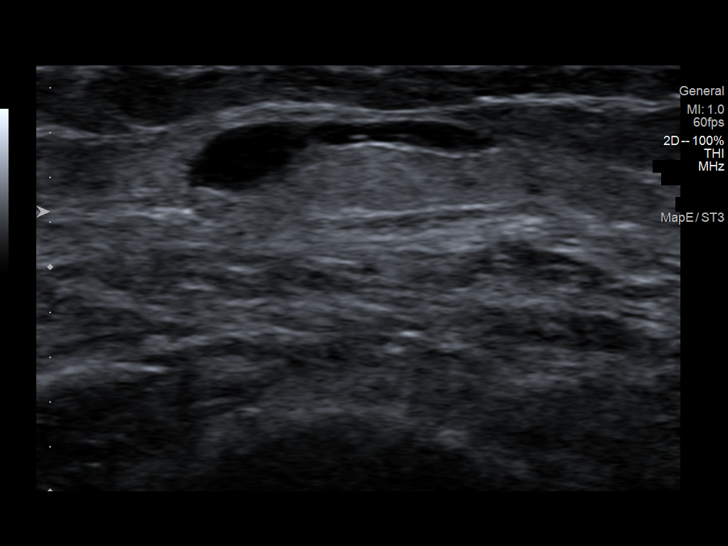
[im 2/10]
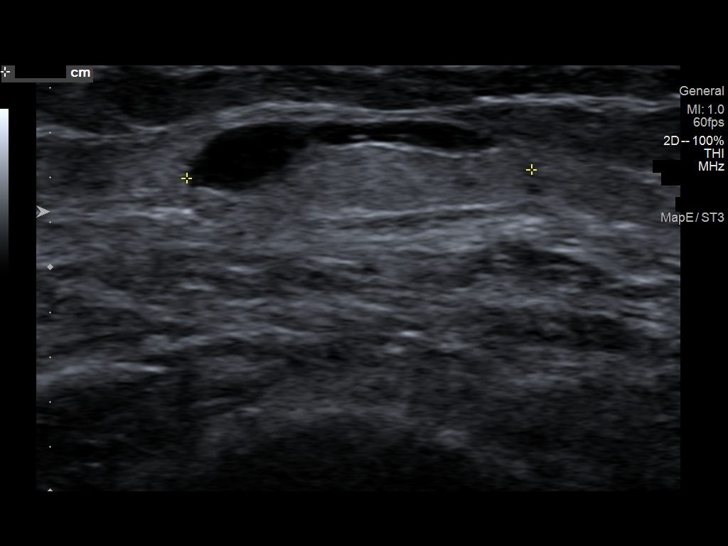
[im 3/10]
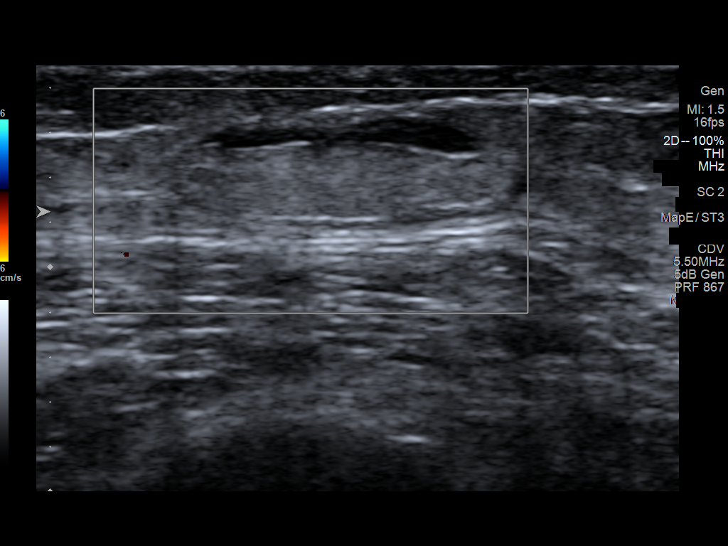
[im 4/10]
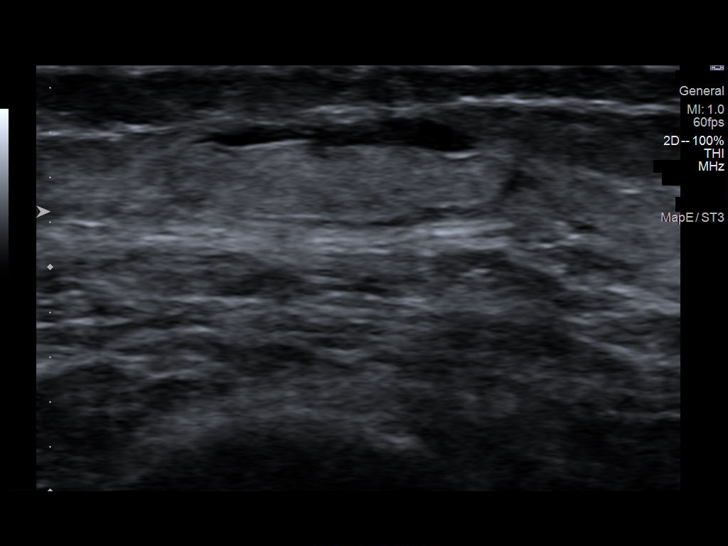
[im 5/10]
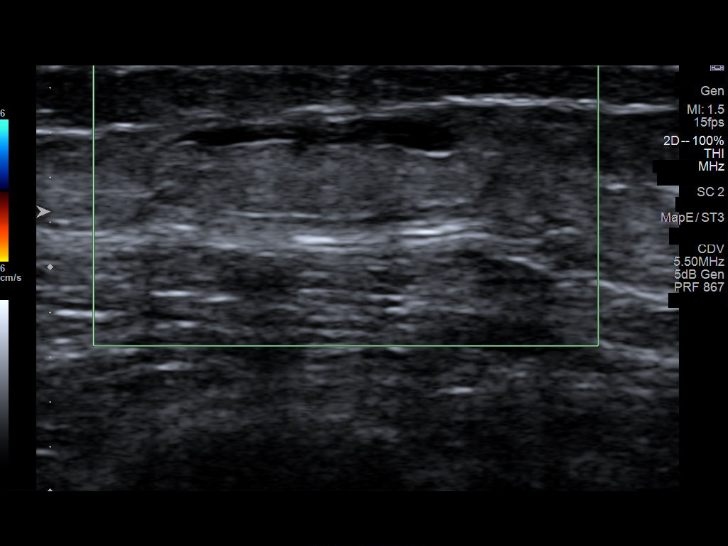
[im 6/10]
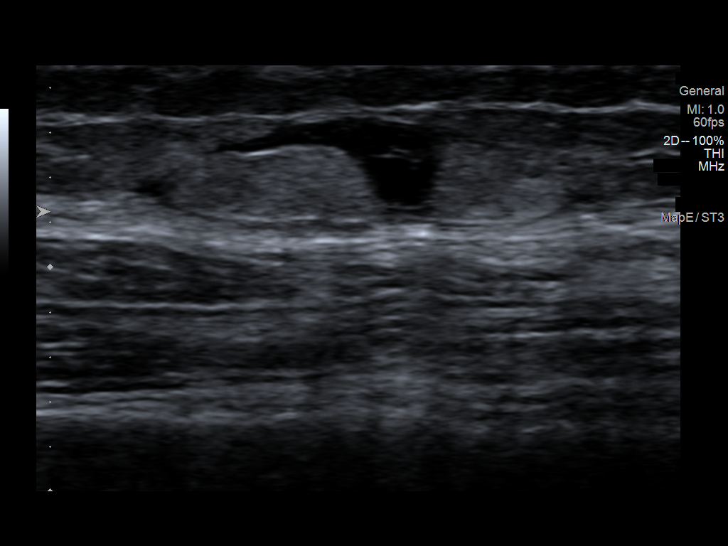
[im 7/10]
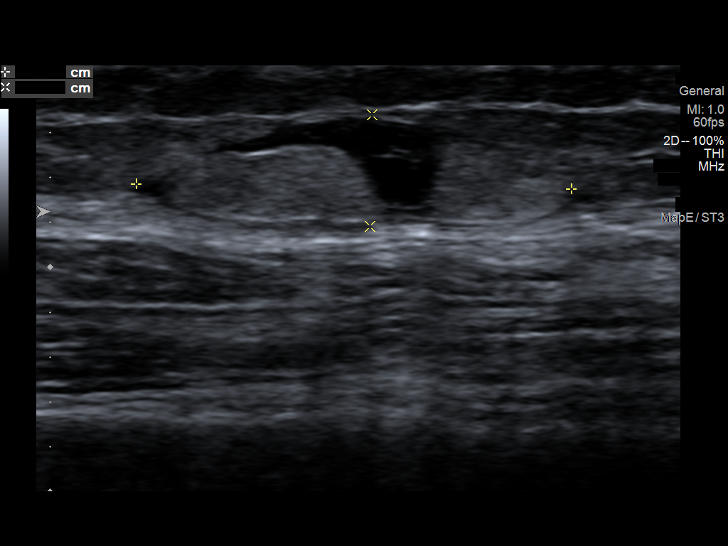
[im 8/10]
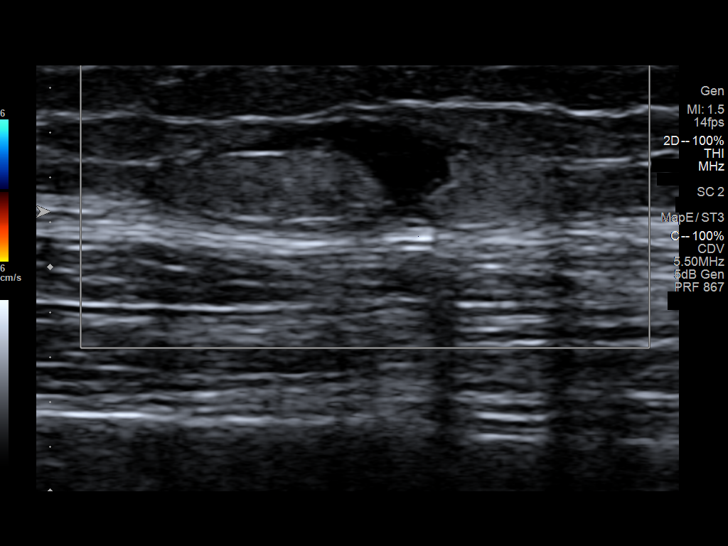
[im 9/10]
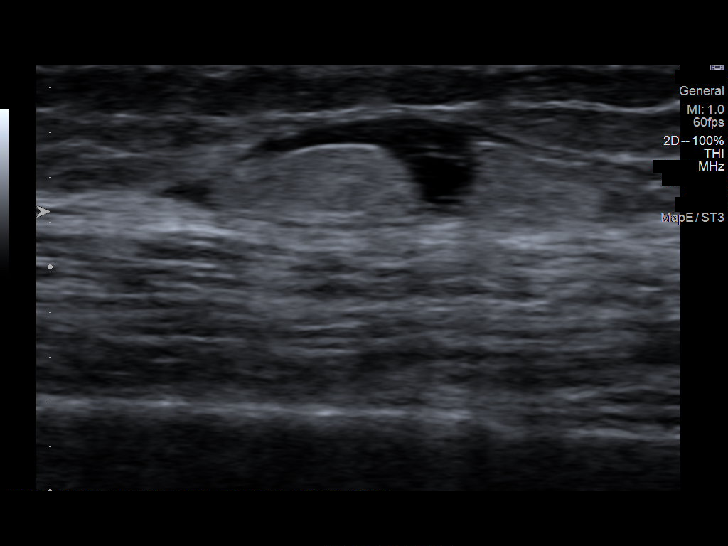
[im 10/10]
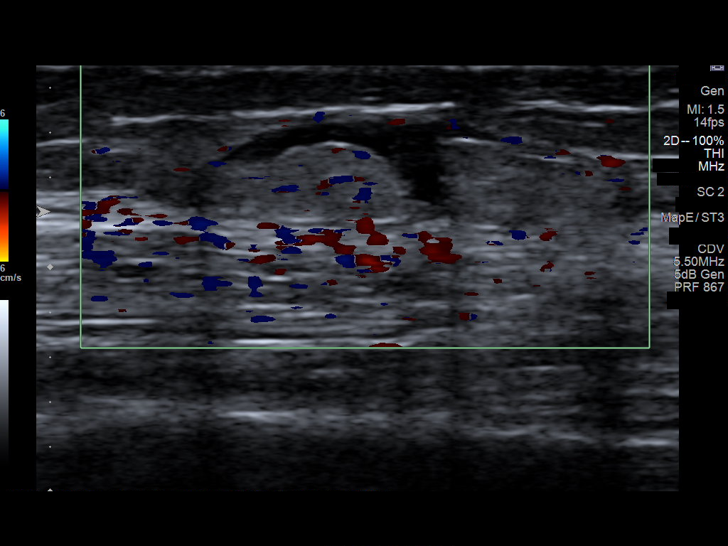

[10 of 10 positions shown; findings below may reference images not displayed]

FINDINGS: Within the palpable area of concern in the right distal thigh, there
is a complex subcutaneous lesion with isoechoic components and
anechoic, fluid appearing components. This measures approximately
1.9 x 0.5 x 1.5 cm. There is no internal vascularity. There is no
peripheral vascularity. There is no sonographically evident
mineralization. The more solid-appearing components are similar in
echogenicity to the surrounding fat.
IMPRESSION: Complex subcutaneous lesion within the palpable area of concern in
the right distal thigh measuring 1.9 x 0.5 x 1.5 cm. No internal
vascularity. This could represent a complex cystic lesion, similar
to the lesion seen on recent ultrasound performed on 08/29/2020.
Recommend clinical follow-up.

## 2023-12-05 IMAGING — DX DG CHEST 1V PORT
2 series · 2 of 2 positions shown · non-contrast
Comparison: 01/14/2021

CLINICAL DATA: Shortness of breath.

EXAM:
PORTABLE CHEST 1 VIEW

[chest ap (1 of 2)]
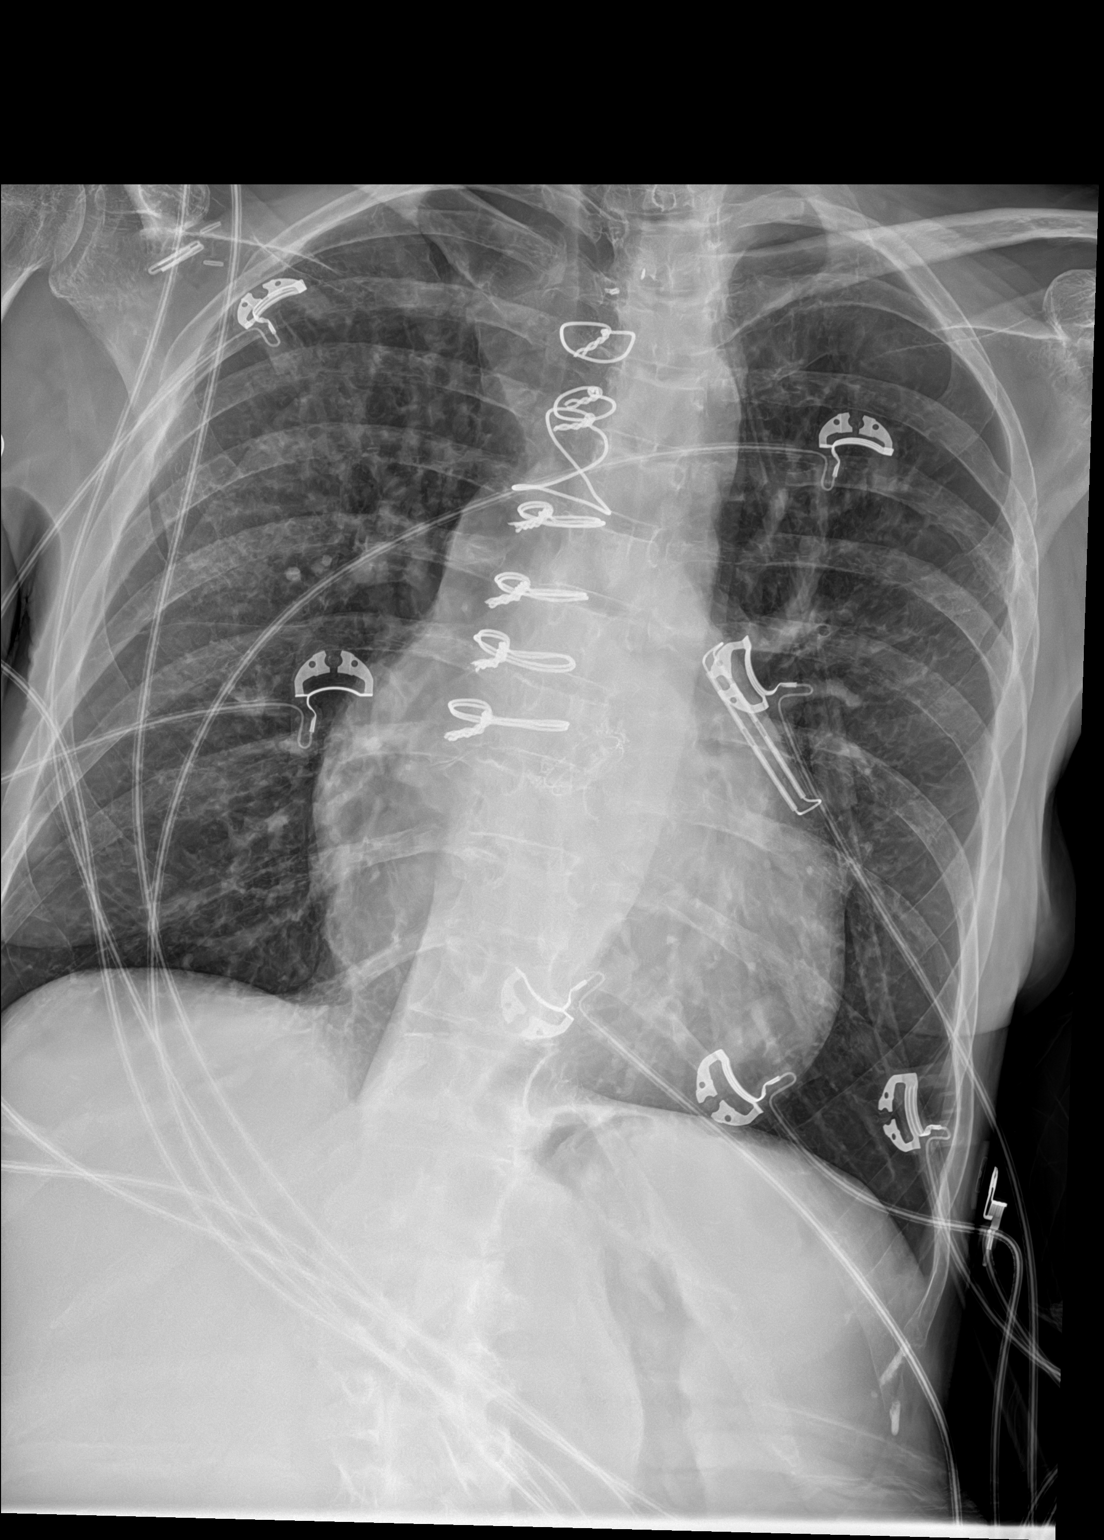

[chest ap (2 of 2)]
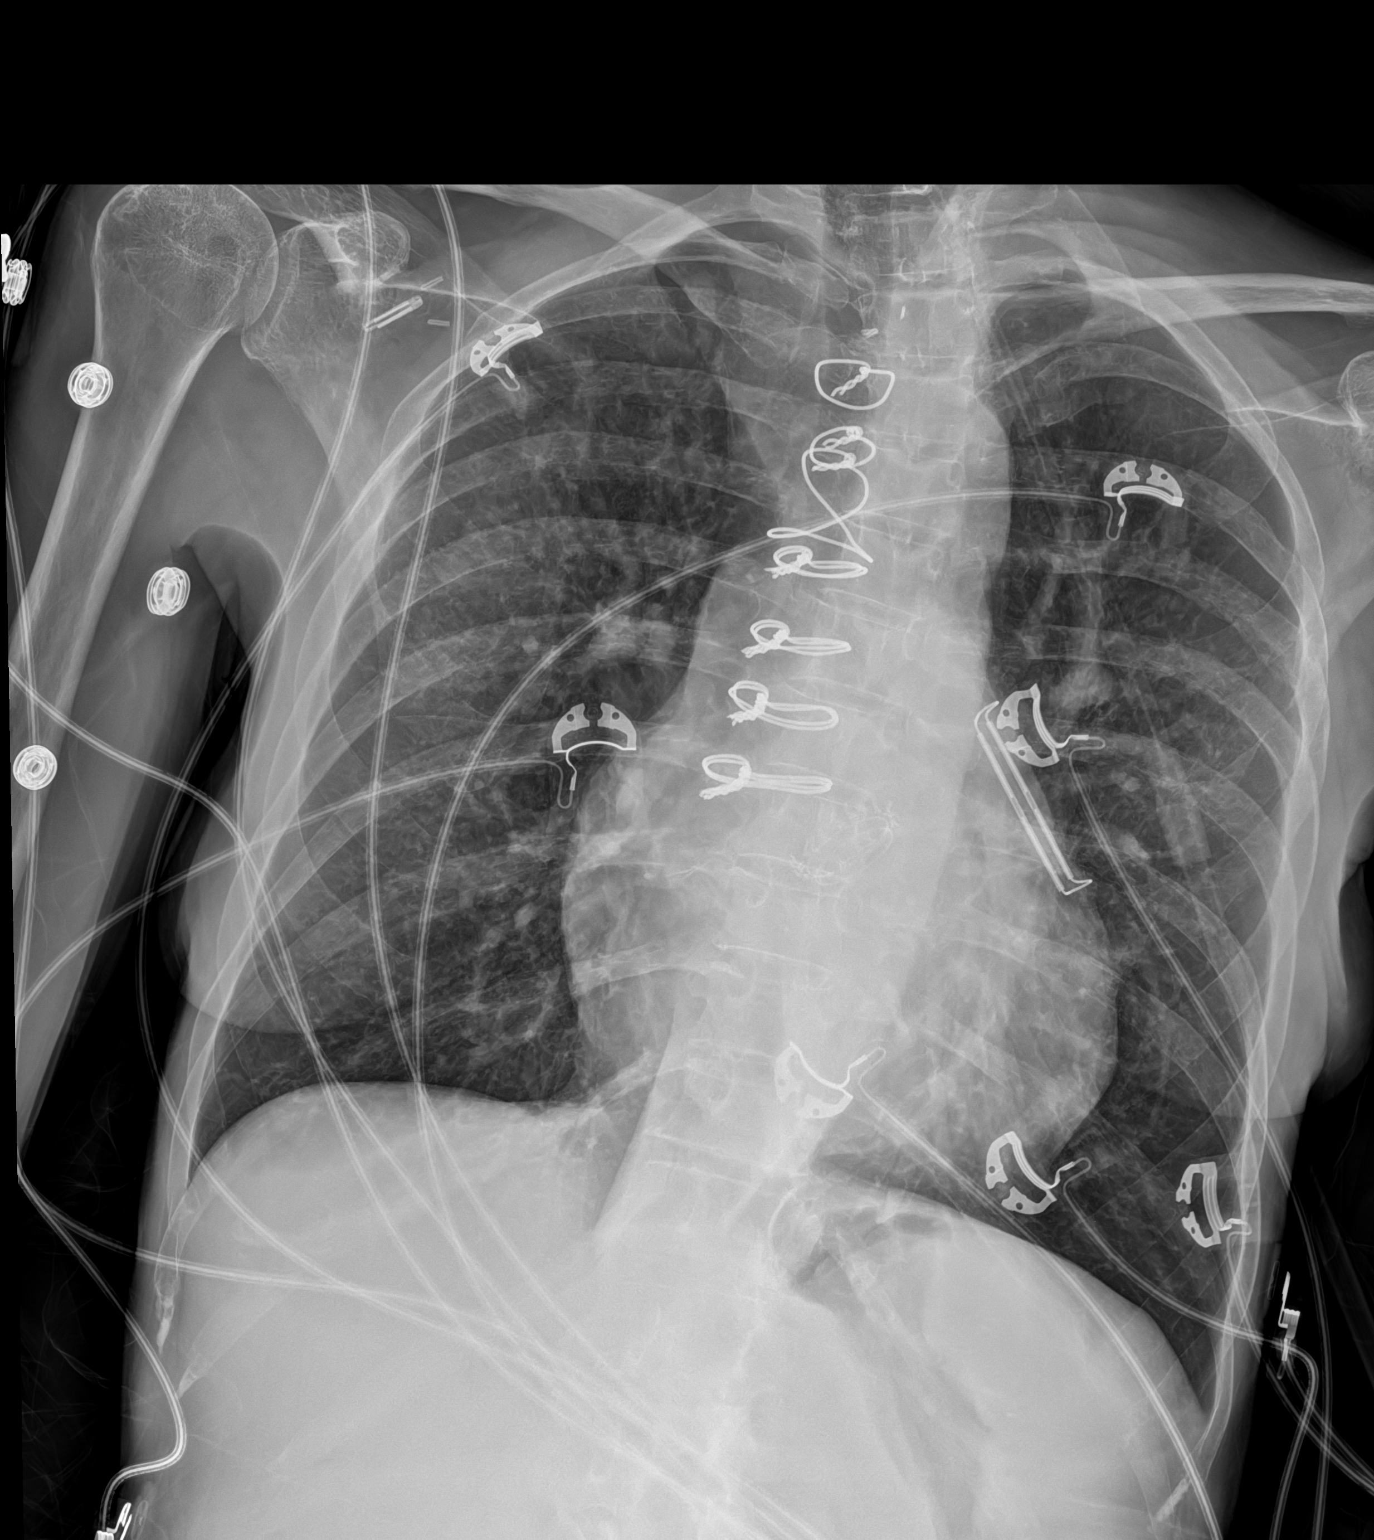

[2 of 2 positions shown; findings below may reference images not displayed]

FINDINGS: 7787 hours. The cardio pericardial silhouette is enlarged. Lungs are
hyperexpanded Interstitial markings are diffusely coarsened with
chronic features. The visualized bony structures of the thorax are
unremarkable. Telemetry leads overlie the chest.
IMPRESSION: Emphysema without acute cardiopulmonary findings.
